# Patient Record
Sex: Male | Born: 1965 | Race: White | Hispanic: No | Smoking: Never smoker
Health system: Southern US, Community
[De-identification: ages and names within clinical notes are randomized; demographics above are authoritative.]

## PROBLEM LIST (undated history)

## (undated) DIAGNOSIS — R7303 Prediabetes: Secondary | ICD-10-CM

## (undated) DIAGNOSIS — Z87442 Personal history of urinary calculi: Secondary | ICD-10-CM

## (undated) DIAGNOSIS — F419 Anxiety disorder, unspecified: Secondary | ICD-10-CM

## (undated) DIAGNOSIS — I10 Essential (primary) hypertension: Secondary | ICD-10-CM

## (undated) DIAGNOSIS — F32A Depression, unspecified: Secondary | ICD-10-CM

## (undated) DIAGNOSIS — R0600 Dyspnea, unspecified: Secondary | ICD-10-CM

## (undated) DIAGNOSIS — D649 Anemia, unspecified: Secondary | ICD-10-CM

## (undated) DIAGNOSIS — M1990 Unspecified osteoarthritis, unspecified site: Secondary | ICD-10-CM

## (undated) DIAGNOSIS — G709 Myoneural disorder, unspecified: Secondary | ICD-10-CM

## (undated) DIAGNOSIS — K219 Gastro-esophageal reflux disease without esophagitis: Secondary | ICD-10-CM

## (undated) DIAGNOSIS — C801 Malignant (primary) neoplasm, unspecified: Secondary | ICD-10-CM

## (undated) DIAGNOSIS — E119 Type 2 diabetes mellitus without complications: Secondary | ICD-10-CM

## (undated) DIAGNOSIS — F329 Major depressive disorder, single episode, unspecified: Secondary | ICD-10-CM

## (undated) HISTORY — PX: APPENDECTOMY: SHX54

## (undated) HISTORY — PX: COLONOSCOPY: SHX174

## (undated) HISTORY — PX: COLON SURGERY: SHX602

---
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
## 2013-05-30 ENCOUNTER — Telehealth: Payer: Self-pay | Admitting: Pulmonary Disease

## 2013-05-30 NOTE — Telephone Encounter (Signed)
Spoke to pt. Has an OV with KC on 07/09/13 for a sleep consult. Pt can not afford a sleep study at this time. He was wondering if there was anything else [facility] can do for him without having a sleep study. I advised him that I could not answer that question for him but more than likely KC would want to have a sleep study done. States that he is going to talk to Dr.Shoemaker and figure out if he really needs to see [facility]. He will call us back and let us know if he will need his appointment.  OV will not be canceled until pt calls and lets [place] know what his decision is.

## 2013-06-25 ENCOUNTER — Ambulatory Visit (HOSPITAL_BASED_OUTPATIENT_CLINIC_OR_DEPARTMENT_OTHER): Payer: Self-pay

## 2013-07-09 ENCOUNTER — Institutional Professional Consult (permissible substitution): Payer: BC Managed Care – PPO | Admitting: Pulmonary Disease

## 2014-11-17 ENCOUNTER — Other Ambulatory Visit: Payer: Self-pay | Admitting: Family Medicine

## 2014-11-17 ENCOUNTER — Ambulatory Visit
Admission: RE | Admit: 2014-11-17 | Discharge: 2014-11-17 | Disposition: A | Discharge status: Home / Self Care | Payer: BLUE CROSS/BLUE SHIELD | Source: Ambulatory Visit | Attending: Family Medicine | Admitting: Family Medicine

## 2014-11-17 DIAGNOSIS — M5489 Other dorsalgia: Secondary | ICD-10-CM

## 2016-05-25 ENCOUNTER — Ambulatory Visit (INDEPENDENT_AMBULATORY_CARE_PROVIDER_SITE_OTHER): Payer: BLUE CROSS/BLUE SHIELD | Admitting: Neurology

## 2016-05-25 ENCOUNTER — Other Ambulatory Visit: Payer: Self-pay | Admitting: *Deleted

## 2016-05-25 DIAGNOSIS — R202 Paresthesia of skin: Secondary | ICD-10-CM

## 2016-05-25 DIAGNOSIS — G5603 Carpal tunnel syndrome, bilateral upper limbs: Secondary | ICD-10-CM

## 2016-05-25 NOTE — Procedures (Signed)
[facility] Neurology  [address], [address]  [place], [place] [address] Tel: [phone] Fax:  [phone] Test Date:  05/25/2016  Patient: Anthony Calhoun DOB: June 18, 1966 Physician: Narda Amber, DO  Sex: Male Height: 5\' 6"  Ref Phys: Antony Contras, M.D.  ID#: [id number] Temp: 35.6C Technician:    Patient Complaints: This is a 50 year-old gentleman referred for evaluation of hand pain, weakness, and paresthesias, worse on the left.  NCV & EMG Findings: Extensive electrodiagnostic testing of the right upper extremity and additional studies of the left shows: 1. Right median sensory response shows prolonged distal peak latency (7.1 ms) and reduced amplitude (5.8 V).  Left median sensory response is absent. Bilateral ulnar sensory responses are within normal limits. 2. Bilateral median motor responses show prolonged distal onset latency (R7.4, L7.9 ms).  Bilateral ulnar motor responses are within normal limits. 3. Mild chronic motor axon loss changes are seen affecting bilateral abductor pollicis brevis muscles, without accompanied active denervation.  Impression: 1. Bilateral median neuropathy, at or distal to the risk, consistent with the clinical diagnosis of carpal tunnel syndrome. Overall, these findings are severe in degree electrically, and worse on the left. 2. There is no evidence of a cervical radiculopathy affecting the upper extremities.   ___________________________ Narda Amber, DO    Nerve Conduction Studies Anti Sensory Summary Table   Site NR Peak (ms) Norm Peak (ms) P-T Amp (V) Norm P-T Amp  Left Median Anti Sensory (2nd Digit)  Wrist NR  <3.6  >15  Right Median Anti Sensory (2nd Digit)  Wrist    7.1 <3.6 5.8 >15  Left Ulnar Anti Sensory (5th Digit)  Wrist    2.8 <3.1 16.1 >10  Right Ulnar Anti Sensory (5th Digit)  Wrist    2.6 <3.1 16.9 >10   Motor Summary Table   Site NR Onset (ms) Norm Onset (ms) O-P Amp (mV) Norm O-P Amp Site1 Site2 Delta-0  (ms) Dist (cm) Vel (m/s) Norm Vel (m/s)  Left Median Motor (Abd Poll Brev)  Wrist    7.9 <4.0 6.1 >6 Elbow Wrist 4.2 28.0 67 >50  Elbow    12.1  6.0         Right Median Motor (Abd Poll Brev)  Wrist    7.4 <4.0 6.2 >6 Elbow Wrist 5.3 27.0 51 >50  Elbow    12.7  5.9         Left Ulnar Motor (Abd Dig Minimi)  Wrist    2.9 <3.1 8.3 >7 B Elbow Wrist 3.5 23.0 66 >50  B Elbow    6.4  7.5  A Elbow B Elbow 2.0 10.0 50 >50  A Elbow    8.4  7.4         Right Ulnar Motor (Abd Dig Minimi)  Wrist    2.5 <3.1 9.8 >7 B Elbow Wrist 4.1 25.0 61 >50  B Elbow    6.6  9.6  A Elbow B Elbow 1.8 10.0 56 >50  A Elbow    8.4  9.5          EMG   Side Muscle Ins Act Fibs Psw Fasc Number Recrt Dur Dur. Amp Amp. Poly Poly. Comment  Left 1stDorInt Nml Nml Nml Nml Nml Nml Nml Nml Nml Nml Nml Nml N/A  Left Abd Poll Brev Nml Nml Nml Nml 1- Rapid Some 1+ Some 1+ Nml Nml N/A  Left Ext Indicis Nml Nml Nml Nml Nml Nml Nml Nml Nml Nml Nml Nml N/A  Left PronatorTeres Nml Nml Nml Nml Nml Nml Nml Nml Nml Nml Nml Nml N/A  Left Biceps Nml Nml Nml Nml Nml Nml Nml Nml Nml Nml Nml Nml N/A  Left Triceps Nml Nml Nml Nml Nml Nml Nml Nml Nml Nml Nml Nml N/A  Left Deltoid Nml Nml Nml Nml Nml Nml Nml Nml Nml Nml Nml Nml N/A  Right 1stDorInt Nml Nml Nml Nml Nml Nml Nml Nml Nml Nml Nml Nml N/A  Right Abd Poll Brev Nml Nml Nml Nml 1- Rapid Few 1+ Few 1+ Nml Nml N/A  Right Ext Indicis Nml Nml Nml Nml Nml Nml Nml Nml Nml Nml Nml Nml N/A  Right PronatorTeres Nml Nml Nml Nml Nml Nml Nml Nml Nml Nml Nml Nml N/A  Right Biceps Nml Nml Nml Nml Nml Nml Nml Nml Nml Nml Nml Nml N/A  Right Triceps Nml Nml Nml Nml Nml Nml Nml Nml Nml Nml Nml Nml N/A  Right Deltoid Nml Nml Nml Nml Nml Nml Nml Nml Nml Nml Nml Nml N/A      Waveforms:

## 2016-07-27 ENCOUNTER — Other Ambulatory Visit: Payer: Self-pay | Admitting: Family Medicine

## 2016-07-27 ENCOUNTER — Ambulatory Visit
Admission: RE | Admit: 2016-07-27 | Discharge: 2016-07-27 | Disposition: A | Discharge status: Home / Self Care | Payer: BLUE CROSS/BLUE SHIELD | Source: Ambulatory Visit | Attending: Family Medicine | Admitting: Family Medicine

## 2016-07-27 DIAGNOSIS — R9389 Abnormal findings on diagnostic imaging of other specified body structures: Secondary | ICD-10-CM

## 2017-08-30 ENCOUNTER — Other Ambulatory Visit: Payer: Self-pay

## 2017-08-30 ENCOUNTER — Encounter (HOSPITAL_COMMUNITY): Payer: Self-pay | Admitting: Emergency Medicine

## 2017-08-30 DIAGNOSIS — N201 Calculus of ureter: Secondary | ICD-10-CM | POA: Insufficient documentation

## 2017-08-30 DIAGNOSIS — R300 Dysuria: Secondary | ICD-10-CM | POA: Diagnosis not present

## 2017-08-30 DIAGNOSIS — R109 Unspecified abdominal pain: Secondary | ICD-10-CM | POA: Diagnosis present

## 2017-08-30 DIAGNOSIS — I10 Essential (primary) hypertension: Secondary | ICD-10-CM | POA: Diagnosis not present

## 2017-08-30 LAB — URINALYSIS, ROUTINE W REFLEX MICROSCOPIC
Bacteria, UA: NONE SEEN
Bilirubin Urine: NEGATIVE
Glucose, UA: NEGATIVE mg/dL
Ketones, ur: NEGATIVE mg/dL
Leukocytes, UA: NEGATIVE
Nitrite: NEGATIVE
Protein, ur: NEGATIVE mg/dL
Specific Gravity, Urine: 1.011 (ref 1.005–1.030)
Squamous Epithelial / HPF: NONE SEEN
pH: 6 (ref 5.0–8.0)

## 2017-08-30 LAB — COMPREHENSIVE METABOLIC PANEL
ALBUMIN: 4.1 g/dL (ref 3.5–5.0)
ALT: 30 U/L (ref 17–63)
ANION GAP: 10 (ref 5–15)
AST: 35 U/L (ref 15–41)
Alkaline Phosphatase: 119 U/L (ref 38–126)
BUN: 9 mg/dL (ref 6–20)
CO2: 26 mmol/L (ref 22–32)
Calcium: 9.3 mg/dL (ref 8.9–10.3)
Chloride: 100 mmol/L — ABNORMAL LOW (ref 101–111)
Creatinine, Ser: 0.92 mg/dL (ref 0.61–1.24)
GFR calc non Af Amer: >60 mL/min (ref >60)
Glucose, Bld: 134 mg/dL — ABNORMAL HIGH (ref 65–99)
POTASSIUM: 4.4 mmol/L (ref 3.5–5.1)
Sodium: 136 mmol/L (ref 135–145)
Total Bilirubin: 0.8 mg/dL (ref 0.3–1.2)
Total Protein: 7.7 g/dL (ref 6.5–8.1)

## 2017-08-30 LAB — CBC
HCT: 45.7 % (ref 39.0–52.0)
Hemoglobin: 15.6 g/dL (ref 13.0–17.0)
MCH: 29.3 pg (ref 26.0–34.0)
MCHC: 34.1 g/dL (ref 30.0–36.0)
MCV: 85.7 fL (ref 78.0–100.0)
Platelets: 309 K/uL (ref 150–400)
RBC: 5.33 MIL/uL (ref 4.22–5.81)
RDW: 13.6 % (ref 11.5–15.5)
WBC: 11.4 K/uL — ABNORMAL HIGH (ref 4.0–10.5)

## 2017-08-30 LAB — LIPASE, BLOOD: Lipase: 22 U/L (ref 11–51)

## 2017-08-30 NOTE — ED Triage Notes (Signed)
Pt c/o lower abdominal pain that radiates to his back. C/o nausea and dry heaving and multiple soft stools. Also c/o a burning sensation and frequency with urination. Hx kidney stones.

## 2017-08-30 NOTE — ED Notes (Signed)
Pt and pt family member does not want to go back out to waiting room pt family member stated that "if he passes out will he go straight back to a room. " I advise pt and pt family that he will come back and reassess. Pt and pt family are becoming really addiment about coming back to a room and that he should not have to wait in waiting room to see a provider. I told pt and pt family that I understand the pain he in is however we have a lot of other pt that are coming in that are really sick as well and we will get him a room as quickly as we can. I told pt and pt family that he have his blood working and UA and if anything comes up really abnormal we will reassess from there. Pt family continue to say" if and when he passes out will he come back to see a doctor and if not the doctor will come see him."

## 2017-08-31 ENCOUNTER — Emergency Department (HOSPITAL_COMMUNITY)
Admission: EM | Admit: 2017-08-31 | Discharge: 2017-08-31 | Disposition: A | Discharge status: Home / Self Care | Payer: BLUE CROSS/BLUE SHIELD | Attending: Emergency Medicine | Admitting: Emergency Medicine

## 2017-08-31 DIAGNOSIS — N201 Calculus of ureter: Secondary | ICD-10-CM

## 2017-08-31 DIAGNOSIS — R300 Dysuria: Secondary | ICD-10-CM

## 2017-08-31 HISTORY — DX: Essential (primary) hypertension: I10

## 2017-08-31 MED ORDER — ONDANSETRON HCL 4 MG PO TABS: ORAL_TABLET | ORAL | 0 refills | Status: DC

## 2017-08-31 MED ORDER — ONDANSETRON 4 MG PO TBDP
8.0000 mg | ORAL_TABLET | Freq: Once | ORAL | Status: AC
  Administered 2017-08-31: 8 mg via ORAL
  Filled 2017-08-31: qty 2

## 2017-08-31 MED ORDER — PHENAZOPYRIDINE HCL 100 MG PO TABS
200.0000 mg | ORAL_TABLET | Freq: Once | ORAL | Status: AC
  Administered 2017-08-31: 200 mg via ORAL
  Filled 2017-08-31: qty 2

## 2017-08-31 MED ORDER — IBUPROFEN 400 MG PO TABS
600.0000 mg | ORAL_TABLET | Freq: Once | ORAL | Status: AC
  Administered 2017-08-31: 600 mg via ORAL
  Filled 2017-08-31: qty 1

## 2017-08-31 MED ORDER — PHENAZOPYRIDINE HCL 200 MG PO TABS: 200.0000 mg | ORAL_TABLET | Freq: Three times a day (TID) | ORAL | 0 refills | Status: DC | PRN

## 2017-08-31 NOTE — ED Provider Notes (Signed)
[place] EMERGENCY DEPARTMENT Provider Note   CSN: [id number] Arrival date & time: 08/30/17  1717  Time seen 01:20 AM   History   Chief Complaint Chief Complaint  Patient presents with  . Abdominal Pain    HPI Anthony Calhoun is a 52 y.o. male.  HPI patient reports about 10 AM on January 3 he started having diffuse abdominal pain and bilateral flank pain.  He describes the pain as knifelike and constant but states the pain waxed and waned.  He states nothing he did made it hurt more, however having a bowel movement would make it is up a little.  He has had nausea and dry heaves without vomiting.  He states he has had 4-6 mushy bowel movements today.  He states while in the emergency department waiting room about 730 or 8 PM he passed a kidney stone.  Since then his pain is much improved and he just has some mild nausea now.  He also complains of throbbing in his penis.  He states before when he had that the urologist treated him with Pyridium.  He also describes being unable to urinate prior to passing the stone and now he is having polyuria.  He also states when he passed a stone he saw some blood in his urine.  He states he has had multiple kidney stones in the past, too numerous to count.  He was seen by [facility] about a year ago.  He states he is never had to have lithotripsy or stent however he did have cystoscopy for stone removal once when he was 60.  He states he usually gets them in the summer.  He usually gets them about once a year.  PCP Antony Contras, MD   Past Medical History:  Diagnosis Date  . Hypertension     There are no active problems to display for this patient.   History reviewed. No pertinent surgical history.     Home Medications    Prior to Admission medications   Medication Sig Start Date End Date Taking? Authorizing Provider  ondansetron (ZOFRAN) 4 MG tablet Take 1 or 2 under the tongue Q 8hrs for nausea 08/31/17    Rolland Porter, MD  phenazopyridine (PYRIDIUM) 200 MG tablet Take 1 tablet (200 mg total) by mouth 3 (three) times daily as needed (pain on urination). 08/31/17   Rolland Porter, MD    Family History No family history on file.  Social History Social History   Tobacco Use  . Smoking status: Never Smoker  . Smokeless tobacco: Never Used  Substance Use Topics  . Alcohol use: No    Frequency: Never  . Drug use: No  employed   Allergies   Prednisone   Review of Systems Review of Systems  All other systems reviewed and are negative.    Physical Exam Updated Vital Signs BP 121/71   Pulse 86   Temp 98.4 F (36.9 C) (Oral)   Resp 16   Ht 5\' 6"  (1.676 m)   Wt 117.9 kg (260 lb)   SpO2 97%   BMI 41.97 kg/m   Physical Exam  Constitutional: He is oriented to person, place, and time. He appears well-developed and well-nourished.  Non-toxic appearance. He does not appear ill. No distress.  HENT:  Head: Normocephalic and atraumatic.  Right Ear: External ear normal.  Left Ear: External ear normal.  Nose: Nose normal. No mucosal edema or rhinorrhea.  Mouth/Throat: Oropharynx is clear and moist and  mucous membranes are normal. No dental abscesses or uvula swelling.  Eyes: Conjunctivae and EOM are normal. Pupils are equal, round, and reactive to light.  Neck: Normal range of motion and full passive range of motion without pain. Neck supple.  Cardiovascular: Normal rate, regular rhythm and normal heart sounds. Exam reveals no gallop and no friction rub.  No murmur heard. Pulmonary/Chest: Effort normal and breath sounds normal. No respiratory distress. He has no wheezes. He has no rhonchi. He has no rales. He exhibits no tenderness and no crepitus.  Abdominal: Soft. Normal appearance and bowel sounds are normal. He exhibits no distension. There is no tenderness. There is no rebound and no guarding.  No CVA tenderness at this time.  His abdomen is nontender at this time.  Musculoskeletal:  Normal range of motion. He exhibits no edema or tenderness.  Moves all extremities well.   Neurological: He is alert and oriented to person, place, and time. He has normal strength. No cranial nerve deficit.  Skin: Skin is warm, dry and intact. No rash noted. No erythema. No pallor.  Psychiatric: He has a normal mood and affect. His speech is normal and behavior is normal. His mood appears not anxious.  Nursing note and vitals reviewed.    ED Treatments / Results  Labs (all labs ordered are listed, but only abnormal results are displayed) Results for orders placed or performed during the hospital encounter of 08/31/17  Lipase, blood  Result Value Ref Range   Lipase 22 11 - 51 U/L  Comprehensive metabolic panel  Result Value Ref Range   Sodium 136 135 - 145 mmol/L   Potassium 4.4 3.5 - 5.1 mmol/L   Chloride 100 (L) 101 - 111 mmol/L   CO2 26 22 - 32 mmol/L   Glucose, Bld 134 (H) 65 - 99 mg/dL   BUN 9 6 - 20 mg/dL   Creatinine, Ser 0.92 0.61 - 1.24 mg/dL   Calcium 9.3 8.9 - 10.3 mg/dL   Total Protein 7.7 6.5 - 8.1 g/dL   Albumin 4.1 3.5 - 5.0 g/dL   AST 35 15 - 41 U/L   ALT 30 17 - 63 U/L   Alkaline Phosphatase 119 38 - 126 U/L   Total Bilirubin 0.8 0.3 - 1.2 mg/dL   GFR calc non Af Amer >60 >60 mL/min   GFR calc Af Amer >60 >60 mL/min   Anion gap 10 5 - 15  CBC  Result Value Ref Range   WBC 11.4 (H) 4.0 - 10.5 K/uL   RBC 5.33 4.22 - 5.81 MIL/uL   Hemoglobin 15.6 13.0 - 17.0 g/dL   HCT 45.7 39.0 - 52.0 %   MCV 85.7 78.0 - 100.0 fL   MCH 29.3 26.0 - 34.0 pg   MCHC 34.1 30.0 - 36.0 g/dL   RDW 13.6 11.5 - 15.5 %   Platelets 309 150 - 400 K/uL  Urinalysis, Routine w reflex microscopic  Result Value Ref Range   Color, Urine YELLOW YELLOW   APPearance CLEAR CLEAR   Specific Gravity, Urine 1.011 1.005 - 1.030   pH 6.0 5.0 - 8.0   Glucose, UA NEGATIVE NEGATIVE mg/dL   Hgb urine dipstick LARGE (A) NEGATIVE   Bilirubin Urine NEGATIVE NEGATIVE   Ketones, ur NEGATIVE NEGATIVE  mg/dL   Protein, ur NEGATIVE NEGATIVE mg/dL   Nitrite NEGATIVE NEGATIVE   Leukocytes, UA NEGATIVE NEGATIVE   RBC / HPF TOO NUMEROUS TO COUNT 0 - 5 RBC/hpf   WBC, UA 0-5  0 - 5 WBC/hpf   Bacteria, UA NONE SEEN NONE SEEN   Squamous Epithelial / LPF NONE SEEN NONE SEEN   Mucus PRESENT    Laboratory interpretation all normal except hematuria, leukocytosis  EKG  EKG Interpretation None       Radiology No results found.  Procedures Procedures (including critical care time)  Medications Ordered in ED Medications  ibuprofen (ADVIL,MOTRIN) tablet 600 mg (not administered)  ondansetron (ZOFRAN-ODT) disintegrating tablet 8 mg (8 mg Oral Given 08/31/17 0142)  phenazopyridine (PYRIDIUM) tablet 200 mg (200 mg Oral Given 08/31/17 0142)     Initial Impression / Assessment and Plan / ED Course  I have reviewed the triage vital signs and the nursing notes.  Pertinent labs & imaging results that were available during my care of the patient were reviewed by me and considered in my medical decision making (see chart for details).    At the time of my exam patient's main complaint was nausea and the dysuria.  He was given the option of getting IV fluids and IV nausea medication or doing the ODT nausea medication and drinking fluids on his own.  He has been in the emergency department since 6 PM today.  He has elected to do the Zofran ODT and drink fluids.  I also offered to do a CT scan today just for verification if he has any more stones however patient understood it would probably be another couple hours to get it done and get it resulted and he has elected to wait.  He states his pain is much improved.  He has no pain when I palpate his abdomen or percuss over his kidneys.  Patient has a stone in a paper towel in the urine cup that he states he passed in the waiting room.  He was advised he may have some mild achiness now that could be treated with Motrin or Tylenol.  Patient states he has a headache  now because of his frustration of waiting to be seen in the waiting room.  He was given ibuprofen.   Final Clinical Impressions(s) / ED Diagnoses   Final diagnoses:  Dysuria  Ureteral stone    ED Discharge Orders        Ordered    ondansetron (ZOFRAN) 4 MG tablet     08/31/17 0141    phenazopyridine (PYRIDIUM) 200 MG tablet  3 times daily PRN     08/31/17 0141    OTC ibuprofen and acetaminophen  Plan discharge  Rolland Porter, MD, Barbette Or, MD 08/31/17 [phone]

## 2017-08-31 NOTE — ED Notes (Signed)
ED Provider at bedside. 

## 2017-08-31 NOTE — Discharge Instructions (Signed)
Drink plenty of fluids. You can take ibuprofen 600 mg and/or acetaminophen 1000 mg every 6 hrs as needed for aching from passing the stone tonight. Take the pyridium for the pain in your penis and use the zofran for nausea or vomiting.  Recheck if you get a fever, get severe pain again, have uncontrolled vomiting or seem worse. Look at the dietary information to see if there are foods in your diet you should avoid.

## 2018-06-11 ENCOUNTER — Other Ambulatory Visit: Payer: Self-pay | Admitting: Family Medicine

## 2018-06-11 DIAGNOSIS — R0781 Pleurodynia: Secondary | ICD-10-CM

## 2018-06-14 ENCOUNTER — Ambulatory Visit
Admission: RE | Admit: 2018-06-14 | Discharge: 2018-06-14 | Disposition: A | Discharge status: Home / Self Care | Payer: BLUE CROSS/BLUE SHIELD | Source: Ambulatory Visit | Attending: Family Medicine | Admitting: Family Medicine

## 2018-06-14 DIAGNOSIS — R0781 Pleurodynia: Secondary | ICD-10-CM

## 2020-02-13 ENCOUNTER — Other Ambulatory Visit: Payer: Self-pay | Admitting: Gastroenterology

## 2020-02-13 DIAGNOSIS — K6389 Other specified diseases of intestine: Secondary | ICD-10-CM

## 2020-02-13 NOTE — Progress Notes (Signed)
Our office had received a phone call from the patient stating he had a colonoscopy by [facility] yesterday and was told he has a malignant tumor.  I have sent their referral coordinator Bennett Scrape a fax requesting the patient colonoscopy report and pathology report be faxed as soon as available as he is requesting to see Dr. Benay Spice to [facility] at fax [phone].

## 2020-02-16 ENCOUNTER — Telehealth: Payer: Self-pay | Admitting: *Deleted

## 2020-02-16 NOTE — Telephone Encounter (Addendum)
Patient left VM expressing severe anxiety regarding his new diagnosis of colon cancer from recent colonoscopy. Asking to speak to someone for some comfort and hope for his condition. State "I'm a nervous wreck and can't sleep and can't eat".  Wants to see Anthony Calhoun as soon as he can (CT scans on 03/02/20).  Called patient and encouraged him that colon cancer is very curable as long as in early stage. States this was his 1st screening colonoscopy. Will not be able to determine stage until scan and surgery is done. Instructed him to call office when his surgery is scheduled and we'll set up his appointment then. Attempted to reassure him that his journey is not guaranteed to be the same his uncle Anthony Calhoun took--he had many other co-morbidities that impacted his outcome. He expressed appreciation for the call.

## 2020-03-02 ENCOUNTER — Ambulatory Visit
Admission: RE | Admit: 2020-03-02 | Discharge: 2020-03-02 | Disposition: A | Discharge status: Home / Self Care | Payer: BLUE CROSS/BLUE SHIELD | Source: Ambulatory Visit | Attending: Gastroenterology | Admitting: Gastroenterology

## 2020-03-02 DIAGNOSIS — K6389 Other specified diseases of intestine: Secondary | ICD-10-CM

## 2020-03-02 MED ORDER — IOPAMIDOL (ISOVUE-300) INJECTION 61%
125.0000 mL | Freq: Once | INTRAVENOUS | Status: AC | PRN
  Administered 2020-03-02: 125 mL via INTRAVENOUS

## 2020-03-03 ENCOUNTER — Ambulatory Visit: Payer: Self-pay | Admitting: General Surgery

## 2020-03-03 NOTE — H&P (Signed)
The patient is a 54 year old male who presents with colorectal cancer. 54 year old male who presents to the office for evaluation of a newly diagnosed colon adenocarcinoma. Patient was noted to be anemic on his physical exam and fit test was positive. A colonoscopy was performed. This showed a couple transverse colon polyps which were resected and a mass in the cecum which was biopsied. Biopsies show adenocarcinoma. CT scans of the chest, abdomen and pelvis show no signs of metastatic disease.   Past Surgical History Antonietta Jewel, [place]; 03/03/2020 2:02 PM) Colon Polyp Removal - Colonoscopy  Diagnostic Studies History (Chanel Teressa Senter, CMA; 03/03/2020 2:02 PM) Colonoscopy within last year  Allergies [facility] [employer] Teressa Senter, [place]; 03/03/2020 2:03 PM) predniSONE *CORTICOSTEROIDS* Allergies Reconciled  Medication History (Chanel Teressa Senter, CMA; 03/03/2020 2:04 PM) amLODIPine Besylate (5MG  Tablet, Oral) Active. ALPRAZolam (1MG  Tablet, Oral) Active. Carvedilol (6.25MG  Tablet, Oral) Active. Citalopram Hydrobromide (40MG  Tablet, Oral) Active. Omeprazole (20MG  Capsule DR, Oral) Active. Atorvastatin Calcium (40MG  Tablet, Oral) Active. Medications Reconciled  Social History Antonietta Jewel, CMA; 03/03/2020 2:02 PM) Caffeine use Carbonated beverages. No alcohol use No drug use Tobacco use Never smoker.  Family History (Hamilton, Donora; 03/03/2020 2:02 PM) Arthritis Family Members In General, Mother. Bleeding disorder Mother. Cancer Brother, Mother. Cerebrovascular Accident Mother. Colon Polyps Mother. Depression Daughter, Family Members In [place], Mother, Son. Diabetes Mellitus Mother. Hypertension Mother. Thyroid problems Mother.  Other Problems Antonietta Jewel, CMA; 03/03/2020 2:02 PM) Anxiety Disorder Arthritis Colon Cancer Depression Diabetes Mellitus Gastroesophageal Reflux Disease High blood pressure Hypercholesterolemia Kidney Stone Sleep Apnea     Review of  Systems (Chanel Nolan CMA; 03/03/2020 2:02 PM) General Present- Fatigue. Not Present- Appetite Loss, Chills, Fever, Night Sweats, Weight Gain and Weight Loss. HEENT Present- Hearing Loss, Ringing in the Ears and Wears glasses/contact lenses. Not Present- Earache, Hoarseness, Nose Bleed, Oral Ulcers, Seasonal Allergies, Sinus Pain, Sore Throat, Visual Disturbances and Yellow Eyes. Respiratory Present- Snoring. Not Present- Bloody sputum, Chronic Cough, Difficulty Breathing and Wheezing. Breast Not Present- Breast Mass, Breast Pain, Nipple Discharge and Skin Changes. Cardiovascular Present- Difficulty Breathing Lying Down and Shortness of Breath. Not Present- Chest Pain, Leg Cramps, Palpitations, Rapid Heart Rate and Swelling of Extremities. Gastrointestinal Present- Bloating, Excessive gas and Indigestion. Not Present- Abdominal Pain, Bloody Stool, Change in Bowel Habits, Chronic diarrhea, Constipation, Difficulty Swallowing, Gets full quickly at meals, Hemorrhoids, Nausea, Rectal Pain and Vomiting. Male Genitourinary Not Present- Blood in Urine, Change in Urinary Stream, Frequency, Impotence, Nocturia, Painful Urination, Urgency and Urine Leakage. Musculoskeletal Present- Back Pain and Muscle Weakness. Not Present- Joint Pain, Joint Stiffness, Muscle Pain and Swelling of Extremities. Neurological Present- Weakness. Not Present- Decreased Memory, Fainting, Headaches, Numbness, Seizures, Tingling, Tremor and Trouble walking. Psychiatric Present- Anxiety, Depression and Fearful. Not Present- Bipolar, Change in Sleep Pattern and Frequent crying. Endocrine Present- New Diabetes. Not Present- Cold Intolerance, Excessive Hunger, Hair Changes, Heat Intolerance and Hot flashes. Hematology Not Present- Blood Thinners, Easy Bruising, Excessive bleeding, Gland problems, HIV and Persistent Infections.  Vitals (Chanel Nolan CMA; 03/03/2020 2:05 PM) 03/03/2020 2:04 PM Weight: 278.38 lb Height: 66in Body Surface  Area: 2.3 m Body Mass Index: 44.93 kg/m  Temp.: 97.20F  Pulse: 100 (Regular)  BP: 134/82(Sitting, Left Arm, Standard)        Physical Exam Leighton Ruff MD; 03/29/9936 2:34 PM)  General Mental Status-Alert. General Appearance-Cooperative.  Abdomen Palpation/Percussion Palpation and Percussion of the abdomen reveal - Soft and Non Tender.    Assessment & Plan Leighton Ruff MD; 09/02/9676 2:33 PM)  COLON CANCER, ASCENDING (C18.2) Impression: 54 year old male who presents to the office for evaluation of a newly diagnosed ascending colon cancer. This was found on colonoscopy which was performed due to anemia and a positive fit test. A cecal mass was noted. Biopsy showed adenocarcinoma. Distal area was tattooed. CT scans of the chest, abdomen and pelvis showed no signs of metastatic disease. The surgery and anatomy were described to the patient as well as the risks of surgery and the possible complications. These include: Bleeding, deep abdominal infections and possible wound complications such as hernia and infection, damage to adjacent structures, leak of surgical connections, which can lead to other surgeries and possibly an ostomy, possible need for other procedures, such as abscess drains in radiology, possible prolonged hospital stay, possible diarrhea from removal of part of the colon, possible constipation from narcotics, possible bowel, bladder or sexual dysfunction if having rectal surgery, prolonged fatigue/weakness or appetite loss, possible early recurrence of of disease, possible complications of their medical problems such as heart disease or arrhythmias or lung problems, death (less than 1%). I believe the patient understands and wishes to proceed with the surgery.

## 2020-04-06 NOTE — Patient Instructions (Addendum)
DUE TO COVID-19 ONLY ONE VISITOR IS ALLOWED TO COME WITH YOU AND STAY IN THE WAITING ROOM ONLY DURING PRE OP AND PROCEDURE DAY OF SURGERY. THE 1 VISITOR  MAY VISIT WITH YOU AFTER SURGERY IN YOUR PRIVATE ROOM DURING VISITING HOURS ONLY!  YOU NEED TO HAVE A COVID 19 TEST ON_8/12______ @_12 :00______, THIS TEST MUST BE DONE BEFORE SURGERY,  COVID TESTING SITE [place] [id number], IT IS ON THE RIGHT GOING OUT [address] APPROXIMATELY  2 MINUTES PAST [employer] ON THE RIGHT. ONCE YOUR COVID TEST IS COMPLETED,  PLEASE BEGIN THE QUARANTINE INSTRUCTIONS AS OUTLINED IN YOUR HANDOUT.                Kellon Chalk [place]   Your procedure is scheduled on: 04/12/20   Report to [facility] Main  Entrance   Report to admitting at  8:00 AM     Call this number if you have problems the morning of surgery [place], NO CHEWING GUM [place].  Follow bowel prep instructions from Dr. Manon Hilding office  Drink plenty of fluids the day of the prep to prevent dehydration.   DRINK 2 PRESURGERY ENSURE DRINKS THE NIGHT BEFORE SURGERY AT 10:00 PM.    NO SOLIDS AFTER MIDNIGHT THE DAY PRIOR TO THE SURGERY.   NOTHING BY MOUTH EXCEPT CLEAR LIQUIDS UNTIL   7:00 AM    CLEAR LIQUID DIET   Foods Allowed                                                                     Foods Excluded  Coffee and tea, regular and decaf                             liquids that you cannot  Plain Jell-O any favor except red or purple                                           see through such as: Fruit ices (not with fruit pulp)                                     milk, soups, orange juice  Iced Popsicles                                    All solid food Carbonated beverages, regular and diet                                    Cranberry, grape and apple juices Sports drinks like Gatorade Lightly seasoned clear broth or  consume(fat free) Sugar, honey syrup   PLEASE FINISH PRESURGERY ENSURE DRINK PER SURGEON BY:  7:00 AM   Take these medicines the morning of surgery with A SIP  OF WATER: Citalopram, Xanax if needed, Amlodipine, Omeprazole,Carvedilol  How to Manage Your Diabetes Before and After Surgery  Why is it important to control my blood sugar before and after surgery? . Improving blood sugar levels before and after surgery helps healing and can limit problems. . A way of improving blood sugar control is eating a healthy diet by: o  Eating less sugar and carbohydrates o  Increasing activity/exercise o  Talking with your doctor about reaching your blood sugar goals . High blood sugars (greater than 180 mg/dL) can raise your risk of infections and slow your recovery, so you will need to focus on controlling your diabetes during the weeks before surgery. . Make sure that the doctor who takes care of your diabetes knows about your planned surgery including the date and location.  How do I manage my blood sugar before surgery? . Check your blood sugar at least 4 times a day, starting 2 days before surgery, to make sure that the level is not too high or low. o Check your blood sugar the morning of your surgery when you wake up and every 2 hours until you get to the Short Stay unit. . If your blood sugar is less than 70 mg/dL, you will need to treat for low blood sugar: o Do not take insulin. o Treat a low blood sugar (less than 70 mg/dL) with  cup of clear juice (cranberry or apple), 4 glucose tablets, OR glucose gel. o Recheck blood sugar in 15 minutes after treatment (to make sure it is greater than 70 mg/dL). If your blood sugar is not greater than 70 mg/dL on recheck, call [phone] for further instructions. . Report your blood sugar to the short stay nurse when you get to Short Stay.  . If you are admitted to the hospital after surgery: o Your blood sugar will be checked by the staff and you will  probably be given insulin after surgery (instead of oral diabetes medicines) to make sure you have good blood sugar levels. o The goal for blood sugar control after surgery is 80-180 mg/dL                                      You may not have any metal on your body including              piercings  Do not wear jewelry, , lotions, powders or deodorant                         Men may shave face and neck.   Do not bring valuables to the hospital. [place].  Contacts, dentures or bridgework may not be worn into surgery.                  Please read over the following fact sheets you were given: _____________________________________________________________________             [facility] - Preparing for Surgery  Before surgery, you can play an important role.   Because skin is not sterile, your skin needs to be as free of germs as possible.   You can reduce the number of germs on your skin by washing with CHG (chlorahexidine gluconate) soap before surgery.   CHG is  an antiseptic cleaner which kills germs and bonds with the skin to continue killing germs even after washing. Please DO NOT use if you have an allergy to CHG or antibacterial soaps .  If your skin becomes reddened/irritated stop using the CHG and inform your nurse when you arrive at Short Stay.   You may shave your face/neck.  Please follow these instructions carefully:  1.  Shower with CHG Soap the night before surgery and the  morning of Surgery.  2.  If you choose to wash your hair, wash your hair first as usual with your  normal  shampoo.  3.  After you shampoo, rinse your hair and body thoroughly to remove the  shampoo.                                        4.  Use CHG as you would any other liquid soap.  You can apply chg directly  to the skin and wash                       Gently with a scrungie or clean washcloth.  5.  Apply the CHG Soap to your body ONLY FROM  THE NECK DOWN.   Do not use on face/ open                           Wound or open sores. Avoid contact with eyes, ears mouth and genitals (private parts).                       Wash face,  Genitals (private parts) with your normal soap.             6.  Wash thoroughly, paying special attention to the area where your surgery  will be performed.  7.  Thoroughly rinse your body with warm water from the neck down.  8.  DO NOT shower/wash with your normal soap after using and rinsing off  the CHG Soap.            9.  Pat yourself dry with a clean towel.            10.  Wear clean pajamas.            11.  Place clean sheets on your bed the night of your first shower and do not  sleep with pets. Day of Surgery : Do not apply any lotions/deodorants the morning of surgery.  Please wear clean clothes to the hospital/surgery center.  FAILURE TO FOLLOW THESE INSTRUCTIONS MAY RESULT IN THE CANCELLATION OF YOUR SURGERY PATIENT SIGNATURE_________________________________  NURSE SIGNATURE__________________________________  ________________________________________________________________________   Adam Phenix  An incentive spirometer is a tool that can help keep your lungs clear and active. This tool measures how well you are filling your lungs with each breath. Taking long deep breaths may help reverse or decrease the chance of developing breathing (pulmonary) problems (especially infection) following:  A long period of time when you are unable to move or be active. BEFORE THE PROCEDURE   If the spirometer includes an indicator to show your best effort, your nurse or respiratory therapist will set it to a desired goal.  If possible, sit up straight or lean slightly forward. Try not to slouch.  Hold the incentive spirometer in an upright position.  INSTRUCTIONS FOR USE  1. Sit on the edge of your bed if possible, or sit up as far as you can in bed or on a chair. 2. Hold the incentive  spirometer in an upright position. 3. Breathe out normally. 4. Place the mouthpiece in your mouth and seal your lips tightly around it. 5. Breathe in slowly and as deeply as possible, raising the piston or the ball toward the top of the column. 6. Hold your breath for 3-5 seconds or for as long as possible. Allow the piston or ball to fall to the bottom of the column. 7. Remove the mouthpiece from your mouth and breathe out normally. 8. Rest for a few seconds and repeat Steps 1 through 7 at least 10 times every 1-2 hours when you are awake. Take your time and take a few normal breaths between deep breaths. 9. The spirometer may include an indicator to show your best effort. Use the indicator as a goal to work toward during each repetition. 10. After each set of 10 deep breaths, practice coughing to be sure your lungs are clear. If you have an incision (the cut made at the time of surgery), support your incision when coughing by placing a pillow or rolled up towels firmly against it. Once you are able to get out of bed, walk around indoors and cough well. You may stop using the incentive spirometer when instructed by your caregiver.  RISKS AND COMPLICATIONS  Take your time so you do not get dizzy or light-headed.  If you are in pain, you may need to take or ask for pain medication before doing incentive spirometry. It is harder to take a deep breath if you are having pain. AFTER USE  Rest and breathe slowly and easily.  It can be helpful to keep track of a log of your progress. Your caregiver can provide you with a simple table to help with this. If you are using the spirometer at home, follow these instructions: [place] IF:   You are having difficultly using the spirometer.  You have trouble using the spirometer as often as instructed.  Your pain medication is not giving enough relief while using the spirometer.  You develop fever of 100.5 F (38.1 C) or higher. SEEK  IMMEDIATE MEDICAL CARE IF:   You cough up bloody sputum that had not been present before.  You develop fever of 102 F (38.9 C) or greater.  You develop worsening pain at or near the incision site. MAKE SURE YOU:   Understand these instructions.  Will watch your condition.  Will get help right away if you are not doing well or get worse. Document Released: 12/25/2006 Document Revised: 11/06/2011 Document Reviewed: 02/25/2007 [facility] Patient Information 2014 [place], [place].   ________________________________________________________________________

## 2020-04-07 ENCOUNTER — Encounter (HOSPITAL_COMMUNITY)
Admission: RE | Admit: 2020-04-07 | Discharge: 2020-04-07 | Disposition: A | Discharge status: Home / Self Care | Payer: 59 | Source: Ambulatory Visit | Attending: General Surgery | Admitting: General Surgery

## 2020-04-07 ENCOUNTER — Other Ambulatory Visit: Payer: Self-pay

## 2020-04-07 ENCOUNTER — Encounter (HOSPITAL_COMMUNITY): Payer: Self-pay

## 2020-04-07 DIAGNOSIS — Z01818 Encounter for other preprocedural examination: Secondary | ICD-10-CM | POA: Insufficient documentation

## 2020-04-07 HISTORY — DX: Depression, unspecified: F32.A

## 2020-04-07 HISTORY — DX: Anemia, unspecified: D64.9

## 2020-04-07 HISTORY — DX: Malignant (primary) neoplasm, unspecified: C80.1

## 2020-04-07 HISTORY — DX: Personal history of urinary calculi: Z87.442

## 2020-04-07 HISTORY — DX: Dyspnea, unspecified: R06.00

## 2020-04-07 HISTORY — DX: Anxiety disorder, unspecified: F41.9

## 2020-04-07 HISTORY — DX: Gastro-esophageal reflux disease without esophagitis: K21.9

## 2020-04-07 HISTORY — DX: Prediabetes: R73.03

## 2020-04-07 HISTORY — DX: Unspecified osteoarthritis, unspecified site: M19.90

## 2020-04-07 HISTORY — DX: Myoneural disorder, unspecified: G70.9

## 2020-04-07 LAB — HEMOGLOBIN A1C
Hgb A1c MFr Bld: 6.6 % — ABNORMAL HIGH (ref 4.8–5.6)
Mean Plasma Glucose: 142.72 mg/dL

## 2020-04-07 LAB — CBC
HCT: 40.8 % (ref 39.0–52.0)
Hemoglobin: 12.5 g/dL — ABNORMAL LOW (ref 13.0–17.0)
MCH: 24.9 pg — ABNORMAL LOW (ref 26.0–34.0)
MCHC: 30.6 g/dL (ref 30.0–36.0)
MCV: 81.3 fL (ref 80.0–100.0)
Platelets: 396 10*3/uL (ref 150–400)
RBC: 5.02 MIL/uL (ref 4.22–5.81)
RDW: 16.3 % — ABNORMAL HIGH (ref 11.5–15.5)
WBC: 8.3 10*3/uL (ref 4.0–10.5)
nRBC: 0 % (ref 0.0–0.2)

## 2020-04-07 LAB — BASIC METABOLIC PANEL
Anion gap: 10 (ref 5–15)
BUN: 10 mg/dL (ref 6–20)
CO2: 24 mmol/L (ref 22–32)
Calcium: 8.4 mg/dL — ABNORMAL LOW (ref 8.9–10.3)
Chloride: 103 mmol/L (ref 98–111)
Creatinine, Ser: 0.76 mg/dL (ref 0.61–1.24)
GFR calc Af Amer: >60 mL/min (ref >60)
GFR calc non Af Amer: >60 mL/min (ref >60)
Glucose, Bld: 130 mg/dL — ABNORMAL HIGH (ref 70–99)
Potassium: 3.6 mmol/L (ref 3.5–5.1)
Sodium: 137 mmol/L (ref 135–145)

## 2020-04-07 NOTE — Progress Notes (Signed)
   04/07/20 1135  OBSTRUCTIVE SLEEP APNEA  Have you ever been diagnosed with sleep apnea through a sleep study? No  Do you snore loudly (loud enough to be heard through closed doors)?  1  Do you often feel tired, fatigued, or sleepy during the daytime (such as falling asleep during driving or talking to someone)? 0  Has anyone observed you stop breathing during your sleep? 1  Do you have, or are you being treated for high blood pressure? 1  BMI more than 35 kg/m2? 1  Age > 50 (1-yes) 1  Neck circumference greater than:Male 16 inches or larger, Male 17inches or larger? 1  Male Gender (Yes=1) 1  Obstructive Sleep Apnea Score 7  Score 5 or greater  Results sent to PCP

## 2020-04-07 NOTE — Progress Notes (Signed)
COVID Vaccine Completed:Yes Date COVID Vaccine completed:12/29/19 COVID vaccine manufacturer:  [employer]   PCP - Dr. Iline Oven Cardiologist - no  Chest x-ray - 03/03/20 EKG - 04/07/20 Stress Test - no ECHO - no Cardiac Cath - no  Sleep Study - no Stop bang score was 7 CPAP -no   Fasting Blood Sugar - 130-140 Checks Blood Sugar _____ times a day occasionally test his sugar with his mother's meter  Blood Thinner Instructions:NA Aspirin Instructions: Last Dose:  Anesthesia review:   Patient denies shortness of breath, fever, cough and chest pain at PAT appointment yes   Patient verbalized understanding of instructions that were given to them at the PAT appointment. Patient was also instructed that they will need to review over the PAT instructions again at home before surgery. Yes  Pt reports that he does get SOB since he became anemic. He is very anxious because he has never had a surgery.  After the colonoscopy they told him that he snored really loud and maybe he has sleep apnea.  Dr. Moreen Fowler was going to address his rising A1c.

## 2020-04-08 ENCOUNTER — Other Ambulatory Visit (HOSPITAL_COMMUNITY)
Admission: RE | Admit: 2020-04-08 | Discharge: 2020-04-08 | Disposition: A | Discharge status: Home / Self Care | Payer: 59 | Source: Ambulatory Visit | Attending: General Surgery | Admitting: General Surgery

## 2020-04-08 DIAGNOSIS — Z20822 Contact with and (suspected) exposure to covid-19: Secondary | ICD-10-CM | POA: Diagnosis not present

## 2020-04-08 DIAGNOSIS — Z01812 Encounter for preprocedural laboratory examination: Secondary | ICD-10-CM | POA: Diagnosis present

## 2020-04-08 LAB — SARS CORONAVIRUS 2 (TAT 6-24 HRS): SARS Coronavirus 2: NEGATIVE

## 2020-04-11 MED ORDER — BUPIVACAINE LIPOSOME 1.3 % IJ SUSP
20.0000 mL | Freq: Once | INTRAMUSCULAR | Status: DC
  Filled 2020-04-11: qty 20

## 2020-04-12 ENCOUNTER — Encounter (HOSPITAL_COMMUNITY): Payer: Self-pay | Admitting: General Surgery

## 2020-04-12 ENCOUNTER — Other Ambulatory Visit: Payer: Self-pay

## 2020-04-12 ENCOUNTER — Inpatient Hospital Stay (HOSPITAL_COMMUNITY)
Admission: RE | Admit: 2020-04-12 | Discharge: 2020-04-15 | DRG: 331 | Disposition: A | Discharge status: Home / Self Care | Payer: 59 | Attending: General Surgery | Admitting: General Surgery

## 2020-04-12 ENCOUNTER — Inpatient Hospital Stay (HOSPITAL_COMMUNITY): Payer: 59 | Admitting: Registered Nurse

## 2020-04-12 ENCOUNTER — Encounter (HOSPITAL_COMMUNITY)
Admission: RE | Disposition: A | Discharge status: Home / Self Care | Payer: Self-pay | Source: Home / Self Care | Attending: General Surgery

## 2020-04-12 DIAGNOSIS — C182 Malignant neoplasm of ascending colon: Principal | ICD-10-CM | POA: Diagnosis present

## 2020-04-12 DIAGNOSIS — Z888 Allergy status to other drugs, medicaments and biological substances status: Secondary | ICD-10-CM | POA: Diagnosis not present

## 2020-04-12 DIAGNOSIS — Z818 Family history of other mental and behavioral disorders: Secondary | ICD-10-CM

## 2020-04-12 DIAGNOSIS — E119 Type 2 diabetes mellitus without complications: Secondary | ICD-10-CM | POA: Diagnosis not present

## 2020-04-12 DIAGNOSIS — I10 Essential (primary) hypertension: Secondary | ICD-10-CM | POA: Diagnosis present

## 2020-04-12 DIAGNOSIS — E7800 Pure hypercholesterolemia, unspecified: Secondary | ICD-10-CM | POA: Diagnosis not present

## 2020-04-12 DIAGNOSIS — K219 Gastro-esophageal reflux disease without esophagitis: Secondary | ICD-10-CM | POA: Diagnosis present

## 2020-04-12 DIAGNOSIS — Z87442 Personal history of urinary calculi: Secondary | ICD-10-CM

## 2020-04-12 DIAGNOSIS — Z8371 Family history of colonic polyps: Secondary | ICD-10-CM | POA: Diagnosis not present

## 2020-04-12 DIAGNOSIS — Z833 Family history of diabetes mellitus: Secondary | ICD-10-CM

## 2020-04-12 DIAGNOSIS — Z8249 Family history of ischemic heart disease and other diseases of the circulatory system: Secondary | ICD-10-CM

## 2020-04-12 DIAGNOSIS — F329 Major depressive disorder, single episode, unspecified: Secondary | ICD-10-CM | POA: Diagnosis present

## 2020-04-12 LAB — TYPE AND SCREEN
ABO/RH(D): O POS
Antibody Screen: NEGATIVE

## 2020-04-12 LAB — ABO/RH: ABO/RH(D): O POS

## 2020-04-12 LAB — GLUCOSE, CAPILLARY: Glucose-Capillary: 120 mg/dL — ABNORMAL HIGH (ref 70–99)

## 2020-04-12 SURGERY — COLECTOMY, PARTIAL, ROBOT-ASSISTED, LAPAROSCOPIC
Anesthesia: General

## 2020-04-12 MED ORDER — ROCURONIUM BROMIDE 10 MG/ML (PF) SYRINGE
PREFILLED_SYRINGE | INTRAVENOUS | Status: AC
  Filled 2020-04-12: qty 10

## 2020-04-12 MED ORDER — ONDANSETRON HCL 4 MG/2ML IJ SOLN
4.0000 mg | Freq: Four times a day (QID) | INTRAMUSCULAR | Status: DC | PRN
  Administered 2020-04-14: 4 mg via INTRAVENOUS
  Filled 2020-04-12 (×2): qty 2

## 2020-04-12 MED ORDER — FENTANYL CITRATE (PF) 250 MCG/5ML IJ SOLN
INTRAMUSCULAR | Status: AC
  Filled 2020-04-12: qty 5

## 2020-04-12 MED ORDER — FERROUS SULFATE 325 (65 FE) MG PO TABS
325.0000 mg | ORAL_TABLET | Freq: Every day | ORAL | Status: DC
  Administered 2020-04-13 – 2020-04-15 (×3): 325 mg via ORAL
  Filled 2020-04-12 (×3): qty 1

## 2020-04-12 MED ORDER — AMLODIPINE BESYLATE 5 MG PO TABS
5.0000 mg | ORAL_TABLET | Freq: Every day | ORAL | Status: DC
  Administered 2020-04-13 – 2020-04-14 (×2): 5 mg via ORAL
  Filled 2020-04-12 (×2): qty 1

## 2020-04-12 MED ORDER — SODIUM CHLORIDE 0.9 % IV SOLN
2.0000 g | INTRAVENOUS | Status: AC
  Administered 2020-04-12: 2 g via INTRAVENOUS
  Filled 2020-04-12: qty 2

## 2020-04-12 MED ORDER — SUCCINYLCHOLINE CHLORIDE 200 MG/10ML IV SOSY
PREFILLED_SYRINGE | INTRAVENOUS | Status: AC
  Filled 2020-04-12: qty 10

## 2020-04-12 MED ORDER — HYDROMORPHONE HCL 1 MG/ML IJ SOLN
0.5000 mg | INTRAMUSCULAR | Status: DC | PRN
  Administered 2020-04-12: 0.5 mg via INTRAVENOUS
  Filled 2020-04-12: qty 0.5

## 2020-04-12 MED ORDER — EPHEDRINE 5 MG/ML INJ
INTRAVENOUS | Status: AC
  Filled 2020-04-12: qty 10

## 2020-04-12 MED ORDER — ONDANSETRON HCL 4 MG/2ML IJ SOLN
INTRAMUSCULAR | Status: AC
  Filled 2020-04-12: qty 2

## 2020-04-12 MED ORDER — DIPHENHYDRAMINE HCL 50 MG/ML IJ SOLN
INTRAMUSCULAR | Status: DC | PRN
  Administered 2020-04-12: 12.5 mg via INTRAVENOUS

## 2020-04-12 MED ORDER — ALVIMOPAN 12 MG PO CAPS
12.0000 mg | ORAL_CAPSULE | Freq: Two times a day (BID) | ORAL | Status: DC
  Administered 2020-04-13: 12 mg via ORAL
  Filled 2020-04-12 (×2): qty 1

## 2020-04-12 MED ORDER — CARVEDILOL 6.25 MG PO TABS
6.2500 mg | ORAL_TABLET | Freq: Two times a day (BID) | ORAL | Status: DC
  Administered 2020-04-12 – 2020-04-15 (×6): 6.25 mg via ORAL
  Filled 2020-04-12 (×6): qty 1

## 2020-04-12 MED ORDER — ONDANSETRON HCL 4 MG PO TABS
4.0000 mg | ORAL_TABLET | Freq: Four times a day (QID) | ORAL | Status: DC | PRN
  Administered 2020-04-12 – 2020-04-13 (×2): 4 mg via ORAL
  Filled 2020-04-12: qty 1

## 2020-04-12 MED ORDER — ALUM & MAG HYDROXIDE-SIMETH 200-200-20 MG/5ML PO SUSP: 30.0000 mL | Freq: Four times a day (QID) | ORAL | Status: DC | PRN

## 2020-04-12 MED ORDER — PROPOFOL 10 MG/ML IV BOLUS
INTRAVENOUS | Status: DC | PRN
  Administered 2020-04-12: 150 mg via INTRAVENOUS

## 2020-04-12 MED ORDER — BUPIVACAINE-EPINEPHRINE 0.25% -1:200000 IJ SOLN
INTRAMUSCULAR | Status: DC | PRN
  Administered 2020-04-12: 30 mL

## 2020-04-12 MED ORDER — DEXAMETHASONE SODIUM PHOSPHATE 10 MG/ML IJ SOLN
INTRAMUSCULAR | Status: AC
  Filled 2020-04-12: qty 1

## 2020-04-12 MED ORDER — BUPIVACAINE-EPINEPHRINE (PF) 0.25% -1:200000 IJ SOLN
INTRAMUSCULAR | Status: AC
  Filled 2020-04-12: qty 30

## 2020-04-12 MED ORDER — ENSURE SURGERY PO LIQD
237.0000 mL | Freq: Two times a day (BID) | ORAL | Status: DC
  Administered 2020-04-12 – 2020-04-14 (×4): 237 mL via ORAL

## 2020-04-12 MED ORDER — ENOXAPARIN SODIUM 40 MG/0.4ML ~~LOC~~ SOLN
40.0000 mg | SUBCUTANEOUS | Status: DC
  Administered 2020-04-13 – 2020-04-15 (×3): 40 mg via SUBCUTANEOUS
  Filled 2020-04-12 (×3): qty 0.4

## 2020-04-12 MED ORDER — SUGAMMADEX SODIUM 200 MG/2ML IV SOLN
INTRAVENOUS | Status: DC | PRN
  Administered 2020-04-12: 400 mg via INTRAVENOUS

## 2020-04-12 MED ORDER — ACETAMINOPHEN 500 MG PO TABS
1000.0000 mg | ORAL_TABLET | ORAL | Status: AC
  Administered 2020-04-12: 1000 mg via ORAL
  Filled 2020-04-12: qty 2

## 2020-04-12 MED ORDER — ALPRAZOLAM 1 MG PO TABS
1.0000 mg | ORAL_TABLET | Freq: Four times a day (QID) | ORAL | Status: DC
  Administered 2020-04-12 – 2020-04-14 (×10): 1 mg via ORAL
  Filled 2020-04-12 (×10): qty 1

## 2020-04-12 MED ORDER — KETAMINE HCL 10 MG/ML IJ SOLN
INTRAMUSCULAR | Status: DC | PRN
Start: 2020-04-12 — End: 2020-04-12
  Administered 2020-04-12: 40 mg via INTRAVENOUS

## 2020-04-12 MED ORDER — GLYCOPYRROLATE 0.2 MG/ML IJ SOLN
INTRAMUSCULAR | Status: DC | PRN
  Administered 2020-04-12: .2 mg via INTRAVENOUS

## 2020-04-12 MED ORDER — MIDAZOLAM HCL 2 MG/2ML IJ SOLN
INTRAMUSCULAR | Status: AC
  Filled 2020-04-12: qty 2

## 2020-04-12 MED ORDER — BUPIVACAINE LIPOSOME 1.3 % IJ SUSP
INTRAMUSCULAR | Status: DC | PRN
  Administered 2020-04-12: 20 mL

## 2020-04-12 MED ORDER — RINGERS IRRIGATION IR SOLN
Status: DC | PRN
  Administered 2020-04-12: 1000 mL

## 2020-04-12 MED ORDER — KCL IN DEXTROSE-NACL 20-5-0.45 MEQ/L-%-% IV SOLN
INTRAVENOUS | Status: DC
  Filled 2020-04-12 (×2): qty 1000

## 2020-04-12 MED ORDER — ALVIMOPAN 12 MG PO CAPS
12.0000 mg | ORAL_CAPSULE | ORAL | Status: AC
  Administered 2020-04-12: 12 mg via ORAL
  Filled 2020-04-12: qty 1

## 2020-04-12 MED ORDER — ORAL CARE MOUTH RINSE: 15.0000 mL | Freq: Once | OROMUCOSAL | Status: AC

## 2020-04-12 MED ORDER — SUCCINYLCHOLINE CHLORIDE 200 MG/10ML IV SOSY
PREFILLED_SYRINGE | INTRAVENOUS | Status: DC | PRN
  Administered 2020-04-12: 140 mg via INTRAVENOUS

## 2020-04-12 MED ORDER — ENSURE PRE-SURGERY PO LIQD
592.0000 mL | Freq: Once | ORAL | Status: DC
  Filled 2020-04-12: qty 592

## 2020-04-12 MED ORDER — 0.9 % SODIUM CHLORIDE (POUR BTL) OPTIME
TOPICAL | Status: DC | PRN
  Administered 2020-04-12: 2000 mL

## 2020-04-12 MED ORDER — GABAPENTIN 300 MG PO CAPS: 300.0000 mg | ORAL_CAPSULE | ORAL | Status: DC

## 2020-04-12 MED ORDER — ACETAMINOPHEN 500 MG PO TABS
1000.0000 mg | ORAL_TABLET | Freq: Four times a day (QID) | ORAL | Status: DC
  Administered 2020-04-12 – 2020-04-15 (×11): 1000 mg via ORAL
  Filled 2020-04-12 (×11): qty 2

## 2020-04-12 MED ORDER — LIDOCAINE 2% (20 MG/ML) 5 ML SYRINGE
INTRAMUSCULAR | Status: DC | PRN
  Administered 2020-04-12: 60 mg via INTRAVENOUS

## 2020-04-12 MED ORDER — ENSURE PRE-SURGERY PO LIQD
296.0000 mL | Freq: Once | ORAL | Status: DC
  Filled 2020-04-12: qty 296

## 2020-04-12 MED ORDER — CITALOPRAM HYDROBROMIDE 20 MG PO TABS
40.0000 mg | ORAL_TABLET | Freq: Every day | ORAL | Status: DC
  Administered 2020-04-13 – 2020-04-14 (×2): 40 mg via ORAL
  Filled 2020-04-12 (×2): qty 2

## 2020-04-12 MED ORDER — LIDOCAINE 2% (20 MG/ML) 5 ML SYRINGE
INTRAMUSCULAR | Status: AC
  Filled 2020-04-12: qty 5

## 2020-04-12 MED ORDER — LIDOCAINE 20MG/ML (2%) 15 ML SYRINGE OPTIME
INTRAMUSCULAR | Status: DC | PRN
  Administered 2020-04-12: 1.5 mg/kg/h via INTRAVENOUS

## 2020-04-12 MED ORDER — EPHEDRINE SULFATE-NACL 50-0.9 MG/10ML-% IV SOSY
PREFILLED_SYRINGE | INTRAVENOUS | Status: DC | PRN
  Administered 2020-04-12 (×2): 10 mg via INTRAVENOUS

## 2020-04-12 MED ORDER — MIDAZOLAM HCL 5 MG/5ML IJ SOLN
INTRAMUSCULAR | Status: DC | PRN
  Administered 2020-04-12: 2 mg via INTRAVENOUS

## 2020-04-12 MED ORDER — DIPHENHYDRAMINE HCL 25 MG PO CAPS: 25.0000 mg | ORAL_CAPSULE | Freq: Four times a day (QID) | ORAL | Status: DC | PRN

## 2020-04-12 MED ORDER — FENTANYL CITRATE (PF) 250 MCG/5ML IJ SOLN
INTRAMUSCULAR | Status: DC | PRN
  Administered 2020-04-12 (×3): 50 ug via INTRAVENOUS
  Administered 2020-04-12: 100 ug via INTRAVENOUS

## 2020-04-12 MED ORDER — LACTATED RINGERS IV SOLN: INTRAVENOUS | Status: DC

## 2020-04-12 MED ORDER — ATORVASTATIN CALCIUM 40 MG PO TABS
40.0000 mg | ORAL_TABLET | Freq: Every day | ORAL | Status: DC
  Administered 2020-04-12 – 2020-04-14 (×3): 40 mg via ORAL
  Filled 2020-04-12 (×3): qty 1

## 2020-04-12 MED ORDER — DIPHENHYDRAMINE HCL 50 MG/ML IJ SOLN: 25.0000 mg | Freq: Four times a day (QID) | INTRAMUSCULAR | Status: DC | PRN

## 2020-04-12 MED ORDER — LIDOCAINE HCL 2 % IJ SOLN
INTRAMUSCULAR | Status: AC
  Filled 2020-04-12: qty 20

## 2020-04-12 MED ORDER — SACCHAROMYCES BOULARDII 250 MG PO CAPS
250.0000 mg | ORAL_CAPSULE | Freq: Two times a day (BID) | ORAL | Status: DC
  Administered 2020-04-12 – 2020-04-14 (×5): 250 mg via ORAL
  Filled 2020-04-12 (×5): qty 1

## 2020-04-12 MED ORDER — PANTOPRAZOLE SODIUM 40 MG PO TBEC
40.0000 mg | DELAYED_RELEASE_TABLET | Freq: Every day | ORAL | Status: DC
  Administered 2020-04-13 – 2020-04-14 (×2): 40 mg via ORAL
  Filled 2020-04-12 (×2): qty 1

## 2020-04-12 MED ORDER — PROPOFOL 10 MG/ML IV BOLUS
INTRAVENOUS | Status: AC
  Filled 2020-04-12: qty 40

## 2020-04-12 MED ORDER — KETAMINE HCL 10 MG/ML IJ SOLN
INTRAMUSCULAR | Status: AC
  Filled 2020-04-12: qty 1

## 2020-04-12 MED ORDER — CHLORHEXIDINE GLUCONATE 0.12 % MT SOLN
15.0000 mL | Freq: Once | OROMUCOSAL | Status: AC
  Administered 2020-04-12: 15 mL via OROMUCOSAL

## 2020-04-12 MED ORDER — SIMETHICONE 80 MG PO CHEW
40.0000 mg | CHEWABLE_TABLET | Freq: Four times a day (QID) | ORAL | Status: DC | PRN
  Administered 2020-04-12 – 2020-04-13 (×2): 40 mg via ORAL
  Filled 2020-04-12 (×2): qty 1

## 2020-04-12 MED ORDER — ROCURONIUM BROMIDE 10 MG/ML (PF) SYRINGE
PREFILLED_SYRINGE | INTRAVENOUS | Status: DC | PRN
  Administered 2020-04-12: 60 mg via INTRAVENOUS
  Administered 2020-04-12: 20 mg via INTRAVENOUS
  Administered 2020-04-12 (×2): 10 mg via INTRAVENOUS

## 2020-04-12 MED ORDER — ONDANSETRON HCL 4 MG/2ML IJ SOLN
INTRAMUSCULAR | Status: DC | PRN
  Administered 2020-04-12: 4 mg via INTRAVENOUS

## 2020-04-12 MED ORDER — SODIUM CHLORIDE 0.9 % IV SOLN
2.0000 g | Freq: Two times a day (BID) | INTRAVENOUS | Status: AC
  Administered 2020-04-12: 2 g via INTRAVENOUS
  Filled 2020-04-12: qty 2

## 2020-04-12 MED ORDER — HYDROMORPHONE HCL 1 MG/ML IJ SOLN: 0.2500 mg | INTRAMUSCULAR | Status: DC | PRN

## 2020-04-12 MED ORDER — DIPHENHYDRAMINE HCL 50 MG/ML IJ SOLN
INTRAMUSCULAR | Status: AC
  Filled 2020-04-12: qty 1

## 2020-04-12 SURGICAL SUPPLY — 92 items
ADH SKN CLS APL DERMABOND .7 (GAUZE/BANDAGES/DRESSINGS) ×1
BLADE EXTENDED COATED 6.5IN (ELECTRODE) IMPLANT
CANNULA REDUC XI 12-8 STAPL (CANNULA) ×2
CANNULA REDUCER 12-8 DVNC XI (CANNULA) ×1 IMPLANT
CELLS DAT CNTRL 66122 CELL SVR (MISCELLANEOUS) IMPLANT
COVER SURGICAL LIGHT HANDLE (MISCELLANEOUS) ×4 IMPLANT
COVER TIP SHEARS 8 DVNC (MISCELLANEOUS) ×1 IMPLANT
COVER TIP SHEARS 8MM DA VINCI (MISCELLANEOUS) ×2
COVER WAND RF STERILE (DRAPES) ×2 IMPLANT
DECANTER SPIKE VIAL GLASS SM (MISCELLANEOUS) IMPLANT
DERMABOND ADVANCED (GAUZE/BANDAGES/DRESSINGS) ×1
DERMABOND ADVANCED .7 DNX12 (GAUZE/BANDAGES/DRESSINGS) IMPLANT
DRAIN CHANNEL 19F RND (DRAIN) IMPLANT
DRAPE ARM DVNC X/XI (DISPOSABLE) ×4 IMPLANT
DRAPE COLUMN DVNC XI (DISPOSABLE) ×1 IMPLANT
DRAPE DA VINCI XI ARM (DISPOSABLE) ×8
DRAPE DA VINCI XI COLUMN (DISPOSABLE) ×2
DRAPE SURG IRRIG POUCH 19X23 (DRAPES) ×2 IMPLANT
DRSG OPSITE POSTOP 4X10 (GAUZE/BANDAGES/DRESSINGS) IMPLANT
DRSG OPSITE POSTOP 4X6 (GAUZE/BANDAGES/DRESSINGS) ×1 IMPLANT
DRSG OPSITE POSTOP 4X8 (GAUZE/BANDAGES/DRESSINGS) IMPLANT
ELECT PENCIL ROCKER SW 15FT (MISCELLANEOUS) ×2 IMPLANT
ELECT REM PT RETURN 15FT ADLT (MISCELLANEOUS) ×2 IMPLANT
ENDOLOOP SUT PDS II  0 18 (SUTURE)
ENDOLOOP SUT PDS II 0 18 (SUTURE) IMPLANT
EVACUATOR SILICONE 100CC (DRAIN) IMPLANT
GLOVE BIO SURGEON STRL SZ 6.5 (GLOVE) ×6 IMPLANT
GLOVE BIOGEL PI IND STRL 7.0 (GLOVE) ×2 IMPLANT
GLOVE BIOGEL PI INDICATOR 7.0 (GLOVE) ×2
GOWN STRL REUS W/TWL XL LVL3 (GOWN DISPOSABLE) ×6 IMPLANT
GRASPER SUT TROCAR 14GX15 (MISCELLANEOUS) IMPLANT
HOLDER FOLEY CATH W/STRAP (MISCELLANEOUS) ×2 IMPLANT
IRRIG SUCT STRYKERFLOW 2 WTIP (MISCELLANEOUS) ×2
IRRIGATION SUCT STRKRFLW 2 WTP (MISCELLANEOUS) ×1 IMPLANT
KIT PROCEDURE DA VINCI SI (MISCELLANEOUS)
KIT PROCEDURE DVNC SI (MISCELLANEOUS) IMPLANT
KIT TURNOVER KIT A (KITS) IMPLANT
NDL INSUFFLATION 14GA 120MM (NEEDLE) ×1 IMPLANT
NEEDLE INSUFFLATION 14GA 120MM (NEEDLE) ×2 IMPLANT
PACK CARDIOVASCULAR III (CUSTOM PROCEDURE TRAY) ×2 IMPLANT
PACK COLON (CUSTOM PROCEDURE TRAY) ×2 IMPLANT
PAD POSITIONING PINK XL (MISCELLANEOUS) ×2 IMPLANT
PORT LAP GEL ALEXIS MED 5-9CM (MISCELLANEOUS) IMPLANT
RELOAD STAPLE 60 3.5 BLU DVNC (STAPLE) IMPLANT
RELOAD STAPLE 60 4.3 GRN DVNC (STAPLE) IMPLANT
RELOAD STAPLER 3.5X60 BLU DVNC (STAPLE) ×3 IMPLANT
RELOAD STAPLER 4.3X60 GRN DVNC (STAPLE) ×1 IMPLANT
RETRACTOR WND ALEXIS 18 MED (MISCELLANEOUS) IMPLANT
RTRCTR WOUND ALEXIS 18CM MED (MISCELLANEOUS)
SCISSORS LAP 5X35 DISP (ENDOMECHANICALS) IMPLANT
SEAL CANN UNIV 5-8 DVNC XI (MISCELLANEOUS) ×3 IMPLANT
SEAL XI 5MM-8MM UNIVERSAL (MISCELLANEOUS) ×6
SEALER VESSEL DA VINCI XI (MISCELLANEOUS) ×2
SEALER VESSEL EXT DVNC XI (MISCELLANEOUS) ×1 IMPLANT
SOLUTION ELECTROLUBE (MISCELLANEOUS) ×2 IMPLANT
STAPLER 45 DA VINCI SURE FORM (STAPLE)
STAPLER 45 SUREFORM DVNC (STAPLE) IMPLANT
STAPLER 60 DA VINCI SURE FORM (STAPLE) ×2
STAPLER 60 SUREFORM DVNC (STAPLE) ×1 IMPLANT
STAPLER CANNULA SEAL DVNC XI (STAPLE) IMPLANT
STAPLER CANNULA SEAL XI (STAPLE)
STAPLER RELOAD 3.5X60 BLU DVNC (STAPLE) ×3
STAPLER RELOAD 3.5X60 BLUE (STAPLE) ×6
STAPLER RELOAD 4.3X60 GREEN (STAPLE) ×2
STAPLER RELOAD 4.3X60 GRN DVNC (STAPLE) ×1
STOPCOCK 4 WAY LG BORE MALE ST (IV SETS) ×4 IMPLANT
SUT ETHILON 2 0 PS N (SUTURE) IMPLANT
SUT NOVA NAB DX-16 0-1 5-0 T12 (SUTURE) ×4 IMPLANT
SUT PROLENE 2 0 KS (SUTURE) IMPLANT
SUT SILK 2 0 (SUTURE) ×2
SUT SILK 2 0 SH CR/8 (SUTURE) IMPLANT
SUT SILK 2-0 18XBRD TIE 12 (SUTURE) ×1 IMPLANT
SUT SILK 3 0 (SUTURE)
SUT SILK 3 0 SH CR/8 (SUTURE) ×2 IMPLANT
SUT SILK 3-0 18XBRD TIE 12 (SUTURE) IMPLANT
SUT V-LOC BARB 180 2/0GR6 GS22 (SUTURE)
SUT VIC AB 2-0 SH 18 (SUTURE) IMPLANT
SUT VIC AB 2-0 SH 27 (SUTURE)
SUT VIC AB 2-0 SH 27X BRD (SUTURE) IMPLANT
SUT VIC AB 3-0 SH 18 (SUTURE) IMPLANT
SUT VIC AB 4-0 PS2 27 (SUTURE) ×5 IMPLANT
SUT VICRYL 0 UR6 27IN ABS (SUTURE) ×2 IMPLANT
SUTURE V-LC BRB 180 2/0GR6GS22 (SUTURE) IMPLANT
SYR 10ML ECCENTRIC (SYRINGE) ×2 IMPLANT
SYS LAPSCP GELPORT 120MM (MISCELLANEOUS)
SYSTEM LAPSCP GELPORT 120MM (MISCELLANEOUS) IMPLANT
TOWEL OR 17X26 10 PK STRL BLUE (TOWEL DISPOSABLE) IMPLANT
TOWEL OR NON WOVEN STRL DISP B (DISPOSABLE) ×2 IMPLANT
TRAY FOLEY MTR SLVR 16FR STAT (SET/KITS/TRAYS/PACK) ×2 IMPLANT
TROCAR ADV FIXATION 5X100MM (TROCAR) ×2 IMPLANT
TUBING CONNECTING 10 (TUBING) ×4 IMPLANT
TUBING INSUFFLATION 10FT LAP (TUBING) ×2 IMPLANT

## 2020-04-12 NOTE — Anesthesia Procedure Notes (Signed)
Procedure Name: Intubation Date/Time: 04/12/2020 11:23 AM Performed by: Talbot Grumbling, CRNA Pre-anesthesia Checklist: Patient identified, Emergency Drugs available, Suction available and Patient being monitored Patient Re-evaluated:Patient Re-evaluated prior to induction Oxygen Delivery Method: Circle system utilized Preoxygenation: Pre-oxygenation with 100% oxygen Induction Type: IV induction Ventilation: Two handed mask ventilation required Laryngoscope Size: Glidescope (LoPro 3) Grade View: Grade I Tube type: Oral Tube size: 7.5 mm Number of attempts: 1 Airway Equipment and Method: Stylet and Video-laryngoscopy Placement Confirmation: ETT inserted through vocal cords under direct vision,  positive ETCO2 and breath sounds checked- equal and bilateral Secured at: 22 cm Tube secured with: Tape Dental Injury: Teeth and Oropharynx as per pre-operative assessment

## 2020-04-12 NOTE — Transfer of Care (Signed)
Immediate Anesthesia Transfer of Care Note  Patient: Anthony [place]  Procedure(s) Performed: ROBOTIC RIGHT COLECTOMY (N/A )  Patient Location: PACU  Anesthesia Type:General  Level of Consciousness: sedated  Airway & Oxygen Therapy: Patient Spontanous Breathing and Patient connected to face mask oxygen  Post-op Assessment: Report given to RN and Post -op Vital signs reviewed and stable  Post vital signs: Reviewed and stable  Last Vitals:  Vitals Value Taken Time  BP 149/114 04/12/20 1407  Temp    Pulse 80 04/12/20 1408  Resp 17 04/12/20 1408  SpO2 97 % 04/12/20 1408  Vitals shown include unvalidated device data.  Last Pain:  Vitals:   04/12/20 0830  TempSrc: Oral  PainSc:       Patients Stated Pain Goal: 4 (74/45/14 6047)  Complications: No complications documented.

## 2020-04-12 NOTE — Op Note (Signed)
PRE-OPERATIVE DIAGNOSIS:  COLON CANCER  POST-OPERATIVE DIAGNOSIS:  COLON CANCER  PROCEDURE:   ROBOTIC ASSISTED RIGHT COLECTOMY    Surgeon: Leighton Ruff, MD   ASSISTANT: Dr Dema Severin   ANESTHESIA:   local and general  EBL: 64ml Total I/O In: 500 [I.V.:400; IV Piggyback:100] Out: 850 [Urine:800; Blood:50]  Delay start of Pharmacological VTE agent (>24hrs) due to surgical blood loss or risk of bleeding:  no  DRAINS: none   SPECIMEN:  Source of Specimen:  Ascending and transverse colon  DISPOSITION OF SPECIMEN:  PATHOLOGY  COUNTS:  YES  PLAN OF CARE: Admit to inpatient   PATIENT DISPOSITION:  PACU - hemodynamically stable.  INDICATION:    54 y.o. M who was noted to have a mass in his cecum.  This was tattooed distally.  I recommended segmental resection:  The anatomy & physiology of the digestive tract was discussed.  The pathophysiology was discussed.  Natural history risks without surgery was discussed.   I worked to give an overview of the disease and the frequent need to have multispecialty involvement.  I feel the risks of no intervention will lead to serious problems that outweigh the operative risks; therefore, I recommended a partial colectomy to remove the pathology.  Laparoscopic & open techniques were discussed.   Risks such as bleeding, infection, abscess, leak, reoperation, possible ostomy, hernia, heart attack, death, and other risks were discussed.  I noted a good likelihood this will help address the problem.   Goals of post-operative recovery were discussed as well.    The patient expressed understanding & wished to proceed with surgery.  OR FINDINGS:   Patient had tattoo noted in the mid ascending colon   DESCRIPTION:   Informed consent was confirmed.  The patient underwent general anaesthesia without difficulty.  The patient was positioned appropriately.  VTE prevention in place.  The patient's abdomen was clipped, prepped, & draped  in a sterile fashion.  Surgical timeout confirmed our plan.  The patient was positioned in reverse Trendelenburg.  Abdominal entry was gained using a Varies needle in the LUQ.  Entry was clean.  I induced carbon dioxide insufflation.  An 85mm robotic port was placed in the LLQ.  Camera inspection revealed no injury.  Extra ports were carefully placed under direct laparoscopic visualization.  I laparoscopically reflected the greater omentum and the upper abdomen the small bowel in the upper abdomen. The patient was appropriately positioned and the robot was docked to the patient's right side.  Instruments were placed under direct visualization.   I inspected the abdomen.  There was a large amount of tattoo in the mid transverse colon.  There were no other signs of pathology noted.   I began by identifying the ileocolic artery and vein within the mesentery. Dissection was bluntly carried around these structures. The duodenum was identified and free from the structures. I then separated the structures bluntly and used the robotic vessel sealer device to transect these separately.  I developed the retroperitoneal plane bluntly.  I then freed the appendix off its attachments to the pelvic wall. I mobilized the terminal ileum.  I took care to avoid injuring any retroperitoneal structures.  After this I began to mobilize laterally down the white line of [place] and then took down the hepatic flexure using the Enseal device. I mobilized the omentum off of the right transverse colon. The entire colon was then flipped medially and mobilized off of the retroperitoneal structures until I could visualize the lateral edge of  the duodenum underneath.  I gently freed the duodenal attachments.  I then divided the remaining mesentery to the level of the tattooed colon.  This was done using a robotic vessel sealer.  I then divided the colon using a green load 60 mm robotic stapler.  I divided the terminal ileum in a similar  fashion using a blue load stapler.  This freed the specimen.  I placed this in the right upper quadrant.  I then oriented the colon and remaining small bowel in a isoperistaltic manner.  I used robotic scissors to make a small enterotomy in the small bowel and colon.  A 60 mm blue load stapler was then used to create an anastomosis between the remaining small bowel and colon.  The common enterotomy channel was then closed using 2, 2-0 V-Loc sutures. These needles were then removed after the suture was cut.  Hemostasis was good.  The abdomen was irrigated with 1 L of warm normal saline.  The robot was then undocked.  The 12 mm suprapubic port was enlarged to a Pfannenstiel incision.  An [place] wound protector was placed.  The colon was removed through the Pfannenstiel.  We then switched to clean gloves.  The peritoneum was closed using a running 0 Vicryl suture.  The fascia was closed using interrupted #1 Novafil sutures.  The subcutaneous tissue was reapproximated using a 2-0 Vicryl suture and the skin was closed using a running subcuticular 4-0 Vicryl suture.  A sterile dressing was applied.  The port sites were closed using 4-0 Vicryl sutures and Dermabond.  The patient was awakened from anesthesia and sent to the postanesthesia care unit in stable condition.  All counts were correct per operating room staff.  An MD/PA assistant was necessary for tissue manipulation, retraction and positioning due to the complexity of the case and hospital policies

## 2020-04-12 NOTE — Anesthesia Preprocedure Evaluation (Addendum)
Anesthesia Evaluation  Patient identified by MRN, date of birth, ID band Patient awake    Reviewed: Allergy & Precautions, H&P , NPO status , Patient's Chart, lab work & pertinent test results, reviewed documented beta blocker date and time   Airway Mallampati: IV  TM Distance: >3 FB Neck ROM: Full    Dental no notable dental hx. (+) Teeth Intact, Dental Advisory Given   Pulmonary neg pulmonary ROS,    Pulmonary exam normal breath sounds clear to auscultation       Cardiovascular hypertension, Pt. on medications and Pt. on home beta blockers  Rhythm:Regular Rate:Normal     Neuro/Psych Anxiety Depression negative neurological ROS     GI/Hepatic Neg liver ROS, GERD  Medicated and Controlled,  Endo/Other  negative endocrine ROSMorbid obesity  Renal/GU negative Renal ROS  negative genitourinary   Musculoskeletal  (+) Arthritis , Osteoarthritis,    Abdominal   Peds  Hematology  (+) Blood dyscrasia, anemia ,   Anesthesia Other Findings   Reproductive/Obstetrics negative OB ROS                            Anesthesia Physical Anesthesia Plan  ASA: III  Anesthesia Plan: General   Post-op Pain Management:    Induction: Intravenous  PONV Risk Score and Plan: 3 and Ondansetron, Midazolam and Diphenhydramine  Airway Management Planned: Oral ETT and Video Laryngoscope Planned  Additional Equipment:   Intra-op Plan:   Post-operative Plan: Extubation in OR  Informed Consent: I have reviewed the patients History and Physical, chart, labs and discussed the procedure including the risks, benefits and alternatives for the proposed anesthesia with the patient or authorized representative who has indicated his/her understanding and acceptance.     Dental advisory given  Plan Discussed with: CRNA  Anesthesia Plan Comments:        Anesthesia Quick Evaluation

## 2020-04-12 NOTE — H&P (Signed)
The patient is a 54 year old male who presents with colorectal cancer. 54 year old male who presents to the office for evaluation of a newly diagnosed colon adenocarcinoma. Patient was noted to be anemic on his physical exam and fit test was positive. A colonoscopy was performed. This showed a couple transverse colon polyps which were resected and a mass in the cecum which was biopsied. Biopsies show adenocarcinoma. CT scans of the chest, abdomen and pelvis show no signs of metastatic disease.   Past Surgical History  Colon Polyp Removal - Colonoscopy  Diagnostic Studies History Colonoscopy within last year  Allergies  predniSONE *CORTICOSTEROIDS* Allergies Reconciled  Medication History  amLODIPine Besylate (5MG  Tablet, Oral) Active. ALPRAZolam (1MG  Tablet, Oral) Active. Carvedilol (6.25MG  Tablet, Oral) Active. Citalopram Hydrobromide (40MG  Tablet, Oral) Active. Omeprazole (20MG  Capsule DR, Oral) Active. Atorvastatin Calcium (40MG  Tablet, Oral) Active. Medications Reconciled  Social HistoryCaffeine use Carbonated beverages. No alcohol use No drug use Tobacco use Never smoker.  Family History ([place], [place]; 03/03/2020 2:02 PM) Arthritis Family Members In General, Mother. Bleeding disorder Mother. Cancer Brother, Mother. Cerebrovascular Accident Mother. Colon Polyps Mother. Depression Daughter, Family Members In [place], Mother, Son. Diabetes Mellitus Mother. Hypertension Mother. Thyroid problems Mother.  Other Problems  Anxiety Disorder Arthritis Colon Cancer Depression Diabetes Mellitus Gastroesophageal Reflux Disease High blood pressure Hypercholesterolemia Kidney Stone Sleep Apnea     Review of Systems  General Present- Fatigue. Not Present- Appetite Loss, Chills, Fever, Night Sweats, Weight Gain and Weight Loss. HEENT Present- Hearing Loss, Ringing in the Ears and Wears glasses/contact lenses. Not  Present- Earache, Hoarseness, Nose Bleed, Oral Ulcers, Seasonal Allergies, Sinus Pain, Sore Throat, Visual Disturbances and Yellow Eyes. Respiratory Present- Snoring. Not Present- Bloody sputum, Chronic Cough, Difficulty Breathing and Wheezing. Breast Not Present- Breast Mass, Breast Pain, Nipple Discharge and Skin Changes. Cardiovascular Present- Difficulty Breathing Lying Down and Shortness of Breath. Not Present- Chest Pain, Leg Cramps, Palpitations, Rapid Heart Rate and Swelling of Extremities. Gastrointestinal Present- Bloating, Excessive gas and Indigestion. Not Present- Abdominal Pain, Bloody Stool, Change in Bowel Habits, Chronic diarrhea, Constipation, Difficulty Swallowing, Gets full quickly at meals, Hemorrhoids, Nausea, Rectal Pain and Vomiting. Male Genitourinary Not Present- Blood in Urine, Change in Urinary Stream, Frequency, Impotence, Nocturia, Painful Urination, Urgency and Urine Leakage. Musculoskeletal Present- Back Pain and Muscle Weakness. Not Present- Joint Pain, Joint Stiffness, Muscle Pain and Swelling of Extremities. Neurological Present- Weakness. Not Present- Decreased Memory, Fainting, Headaches, Numbness, Seizures, Tingling, Tremor and Trouble walking. Psychiatric Present- Anxiety, Depression and Fearful. Not Present- Bipolar, Change in Sleep Pattern and Frequent crying. Endocrine Present- New Diabetes. Not Present- Cold Intolerance, Excessive Hunger, Hair Changes, Heat Intolerance and Hot flashes. Hematology Not Present- Blood Thinners, Easy Bruising, Excessive bleeding, Gland problems, HIV and Persistent Infections.  BP 134/77   Pulse 74   Temp 97.7 F (36.5 C) (Oral)   Resp 18   Ht 5\' 6"  (6.256 m)   Wt 126.7 kg   SpO2 93%   BMI 45.07 kg/m    Physical Exam (  General Mental Status-Alert. General Appearance-Cooperative. CV: RRR Lungs: CTA Abdomen Palpation/Percussion Palpation and Percussion of the abdomen reveal - Soft and Non  Tender.   Assessment & Plan   COLON CANCER, ASCENDING (C18.2) Impression: 54 year old male who presents to the office for evaluation of a newly diagnosed ascending colon cancer. This was found on colonoscopy which was performed due to anemia and a positive fit test. A cecal mass was noted. Biopsy showed adenocarcinoma. Distal area was tattooed. CT  scans of the chest, abdomen and pelvis showed no signs of metastatic disease. The surgery and anatomy were described to the patient as well as the risks of surgery and the possible complications. These include: Bleeding, deep abdominal infections and possible wound complications such as hernia and infection, damage to adjacent structures, leak of surgical connections, which can lead to other surgeries and possibly an ostomy, possible need for other procedures, such as abscess drains in radiology, possible prolonged hospital stay, possible diarrhea from removal of part of the colon, possible constipation from narcotics, possible bowel, bladder or sexual dysfunction if having rectal surgery, prolonged fatigue/weakness or appetite loss, possible early recurrence of of disease, possible complications of their medical problems such as heart disease or arrhythmias or lung problems, death (less than 1%). I believe the patient understands and wishes to proceed with the surgery.

## 2020-04-12 NOTE — Anesthesia Postprocedure Evaluation (Signed)
Anesthesia Post Note  Patient: Phillipe Clemon Nevers  Procedure(s) Performed: ROBOTIC RIGHT COLECTOMY (N/A )     Patient location during evaluation: PACU Anesthesia Type: General Level of consciousness: awake and alert Pain management: pain level controlled Vital Signs Assessment: post-procedure vital signs reviewed and stable Respiratory status: spontaneous breathing, nonlabored ventilation, respiratory function stable and patient connected to nasal cannula oxygen Cardiovascular status: blood pressure returned to baseline and stable Postop Assessment: no apparent nausea or vomiting Anesthetic complications: no   No complications documented.  Last Vitals:  Vitals:   04/12/20 1430 04/12/20 1445  BP: 126/81 139/81  Pulse: 78 78  Resp: 11 14  Temp:    SpO2: 94% 97%    Last Pain:  Vitals:   04/12/20 1445  TempSrc:   PainSc: 0-No pain                 Dean Goldner,W. EDMOND

## 2020-04-13 LAB — BASIC METABOLIC PANEL
Anion gap: 9 (ref 5–15)
BUN: 8 mg/dL (ref 6–20)
CO2: 26 mmol/L (ref 22–32)
Calcium: 8.1 mg/dL — ABNORMAL LOW (ref 8.9–10.3)
Chloride: 100 mmol/L (ref 98–111)
Creatinine, Ser: 0.96 mg/dL (ref 0.61–1.24)
GFR calc Af Amer: >60 mL/min (ref >60)
GFR calc non Af Amer: >60 mL/min (ref >60)
Glucose, Bld: 150 mg/dL — ABNORMAL HIGH (ref 70–99)
Potassium: 3.4 mmol/L — ABNORMAL LOW (ref 3.5–5.1)
Sodium: 135 mmol/L (ref 135–145)

## 2020-04-13 LAB — CBC
HCT: 37.8 % — ABNORMAL LOW (ref 39.0–52.0)
Hemoglobin: 11.3 g/dL — ABNORMAL LOW (ref 13.0–17.0)
MCH: 24.7 pg — ABNORMAL LOW (ref 26.0–34.0)
MCHC: 29.9 g/dL — ABNORMAL LOW (ref 30.0–36.0)
MCV: 82.5 fL (ref 80.0–100.0)
Platelets: 364 10*3/uL (ref 150–400)
RBC: 4.58 MIL/uL (ref 4.22–5.81)
RDW: 15.9 % — ABNORMAL HIGH (ref 11.5–15.5)
WBC: 13.2 10*3/uL — ABNORMAL HIGH (ref 4.0–10.5)
nRBC: 0 % (ref 0.0–0.2)

## 2020-04-13 MED ORDER — TRAMADOL HCL 50 MG PO TABS
50.0000 mg | ORAL_TABLET | Freq: Four times a day (QID) | ORAL | Status: DC | PRN
  Administered 2020-04-13 – 2020-04-14 (×2): 50 mg via ORAL
  Filled 2020-04-13 (×2): qty 1

## 2020-04-13 MED ORDER — GABAPENTIN 100 MG PO CAPS: 200.0000 mg | ORAL_CAPSULE | Freq: Three times a day (TID) | ORAL | Status: DC

## 2020-04-13 MED ORDER — METHOCARBAMOL 500 MG PO TABS
500.0000 mg | ORAL_TABLET | Freq: Four times a day (QID) | ORAL | Status: DC | PRN
  Administered 2020-04-13 – 2020-04-14 (×2): 500 mg via ORAL
  Filled 2020-04-13 (×2): qty 1

## 2020-04-13 NOTE — Progress Notes (Signed)
1 Day Post-Op Robotic R Colectomy Subjective: Feels bloated and sore.  No flatus yet.  Occasional nausea  Objective: Vital signs in last 24 hours: Temp:  [97.7 F (36.5 C)-99.2 F (37.3 C)] 99.2 F (37.3 C) (08/17 0629) Pulse Rate:  [74-95] 85 (08/17 0629) Resp:  [10-19] 18 (08/17 0629) BP: (112-151)/(68-87) 132/82 (08/17 0629) SpO2:  [92 %-98 %] 93 % (08/17 0629)   Intake/Output from previous day: 08/16 0701 - 08/17 0700 In: 2180 [P.O.:1080; I.V.:1000; IV Piggyback:100] Out: 3625 [Urine:3575; Blood:50] Intake/Output this shift: No intake/output data recorded.   General appearance: alert and cooperative GI: normal findings: soft, mildly distended  Incision: no significant drainage  Lab Results:  Recent Labs    04/13/20 0515  WBC 13.2*  HGB 11.3*  HCT 37.8*  PLT 364   BMET Recent Labs    04/13/20 0515  NA 135  K 3.4*  CL 100  CO2 26  GLUCOSE 150*  BUN 8  CREATININE 0.96  CALCIUM 8.1*   PT/INR No results for input(s): LABPROT, INR in the last 72 hours. ABG No results for input(s): PHART, HCO3 in the last 72 hours.  Invalid input(s): PCO2, PO2  MEDS, Scheduled . acetaminophen  1,000 mg Oral Q6H  . ALPRAZolam  1 mg Oral QID  . alvimopan  12 mg Oral BID  . amLODipine  5 mg Oral Daily  . atorvastatin  40 mg Oral Daily  . carvedilol  6.25 mg Oral BID WC  . citalopram  40 mg Oral Daily  . enoxaparin (LOVENOX) injection  40 mg Subcutaneous Q24H  . feeding supplement  237 mL Oral BID BM  . ferrous sulfate  325 mg Oral Q breakfast  . pantoprazole  40 mg Oral Daily  . saccharomyces boulardii  250 mg Oral BID    Studies/Results: No results found.  Assessment: s/p Procedure(s): ROBOTIC RIGHT COLECTOMY Patient Active Problem List   Diagnosis Date Noted  . Colon cancer, ascending ([place]) 04/12/2020    Expected post op course  Plan: d/c foley  Ambulate SL IVF's Cont fulls until bowel function returns   LOS: 1 day     .Rosario Adie,  MD [facility], [place]    04/13/2020 8:26 AM

## 2020-04-14 LAB — CBC
HCT: 35.1 % — ABNORMAL LOW (ref 39.0–52.0)
Hemoglobin: 10.8 g/dL — ABNORMAL LOW (ref 13.0–17.0)
MCH: 25.1 pg — ABNORMAL LOW (ref 26.0–34.0)
MCHC: 30.8 g/dL (ref 30.0–36.0)
MCV: 81.4 fL (ref 80.0–100.0)
Platelets: 309 10*3/uL (ref 150–400)
RBC: 4.31 MIL/uL (ref 4.22–5.81)
RDW: 15.8 % — ABNORMAL HIGH (ref 11.5–15.5)
WBC: 10.3 10*3/uL (ref 4.0–10.5)
nRBC: 0 % (ref 0.0–0.2)

## 2020-04-14 LAB — BASIC METABOLIC PANEL
Anion gap: 10 (ref 5–15)
BUN: 6 mg/dL (ref 6–20)
CO2: 26 mmol/L (ref 22–32)
Calcium: 7.9 mg/dL — ABNORMAL LOW (ref 8.9–10.3)
Chloride: 102 mmol/L (ref 98–111)
Creatinine, Ser: 0.89 mg/dL (ref 0.61–1.24)
GFR calc Af Amer: >60 mL/min (ref >60)
GFR calc non Af Amer: >60 mL/min (ref >60)
Glucose, Bld: 112 mg/dL — ABNORMAL HIGH (ref 70–99)
Potassium: 3.4 mmol/L — ABNORMAL LOW (ref 3.5–5.1)
Sodium: 138 mmol/L (ref 135–145)

## 2020-04-14 NOTE — Progress Notes (Signed)
2 Days Post-Op Robotic R Colectomy Subjective: Feels much better.  Having BM's.  No further nausea  Objective: Vital signs in last 24 hours: Temp:  [98.1 F (36.7 C)-99.6 F (37.6 C)] 99.6 F (37.6 C) (08/18 0536) Pulse Rate:  [78-95] 95 (08/18 0536) Resp:  [18-20] 18 (08/18 0536) BP: (114-133)/(65-84) 133/84 (08/18 0536) SpO2:  [90 %-92 %] 92 % (08/18 0536)   Intake/Output from previous day: 08/17 0701 - 08/18 0700 In: 1600 [P.O.:1600] Out: 0  Intake/Output this shift: No intake/output data recorded.   General appearance: alert and cooperative GI: normal findings: soft, mildly distended  Incision: no significant drainage  Lab Results:  Recent Labs    04/13/20 0515 04/14/20 0458  WBC 13.2* 10.3  HGB 11.3* 10.8*  HCT 37.8* 35.1*  PLT 364 309   BMET Recent Labs    04/13/20 0515 04/14/20 0458  NA 135 138  K 3.4* 3.4*  CL 100 102  CO2 26 26  GLUCOSE 150* 112*  BUN 8 6  CREATININE 0.96 0.89  CALCIUM 8.1* 7.9*   PT/INR No results for input(s): LABPROT, INR in the last 72 hours. ABG No results for input(s): PHART, HCO3 in the last 72 hours.  Invalid input(s): PCO2, PO2  MEDS, Scheduled . acetaminophen  1,000 mg Oral Q6H  . ALPRAZolam  1 mg Oral QID  . amLODipine  5 mg Oral Daily  . atorvastatin  40 mg Oral Daily  . carvedilol  6.25 mg Oral BID WC  . citalopram  40 mg Oral Daily  . enoxaparin (LOVENOX) injection  40 mg Subcutaneous Q24H  . feeding supplement  237 mL Oral BID BM  . ferrous sulfate  325 mg Oral Q breakfast  . pantoprazole  40 mg Oral Daily  . saccharomyces boulardii  250 mg Oral BID    Studies/Results: No results found.  Assessment: s/p Procedure(s): ROBOTIC RIGHT COLECTOMY Patient Active Problem List   Diagnosis Date Noted  . Colon cancer, ascending ([place]) 04/12/2020    Expected post op course  Plan: Soft diet Ambulate D/c tom    LOS: 2 days     .Rosario Adie, MD [facility],  [place]    04/14/2020 8:49 AM

## 2020-04-15 MED ORDER — TRAMADOL HCL 50 MG PO TABS: 50.0000 mg | ORAL_TABLET | Freq: Four times a day (QID) | ORAL | 0 refills | Status: DC | PRN

## 2020-04-15 MED ORDER — ALPRAZOLAM 1 MG PO TABS: 1.0000 mg | ORAL_TABLET | Freq: Four times a day (QID) | ORAL | 0 refills | Status: DC

## 2020-04-15 MED FILL — traMADol HCL 50 MG TABS: 50 | 7 days supply | Qty: 30 | Fill #0

## 2020-04-15 NOTE — Discharge Summary (Signed)
Physician Discharge Summary  Patient ID: NICKOLAS CHALFIN MRN: [id number] DOB/AGE: November 29, 1965 54 y.o.  Admit date: 04/12/2020 Discharge date: 04/15/2020  Admission Diagnoses:  Discharge Diagnoses:  Active Problems:   Colon cancer, ascending [facility])   Discharged Condition: good  Hospital Course: Pt was admitted after surgery.  Diet was advanced as tolerated.  By POD 3 he was tolerating a diet, having bowel function and his pain was controlled with oral medications.    Consults: None  Significant Diagnostic Studies: labs: cbc, bmet  Treatments: IV hydration, analgesia: acetaminophen and surgery: robotic R colectomy  Discharge Exam: Blood pressure 119/74, pulse 80, temperature 98.5 F (36.9 C), temperature source Oral, resp. rate 18, height 5\' 6"  (1.676 m), weight 126.7 kg, SpO2 92 %. General appearance: alert and cooperative GI: normal findings: soft, non-tender Incision/Wound: clean, dry, intact  Disposition: Discharge disposition: 01-Home or Self Care        Allergies as of 04/15/2020      Reactions   Gabapentin Nausea Only   Prednisone Anxiety   "panic attack" Panic attacks      Medication List    TAKE these medications   ALPRAZolam 1 MG tablet Commonly known as: XANAX Take 1 tablet (1 mg total) by mouth 4 (four) times daily. Not recommended to take this medication more than 4 times a day What changed:   when to take this  additional instructions   amLODipine 5 MG tablet Commonly known as: NORVASC Take 5 mg by mouth daily.   atorvastatin 40 MG tablet Commonly known as: LIPITOR Take 40 mg by mouth daily.   carvedilol 6.25 MG tablet Commonly known as: COREG Take 6.25 mg by mouth 2 (two) times daily with a meal.   citalopram 40 MG tablet Commonly known as: CELEXA Take 40 mg by mouth in the morning and at bedtime.   omeprazole 20 MG capsule Commonly known as: PRILOSEC Take 20 mg by mouth daily.   ondansetron 4 MG tablet Commonly known as:  ZOFRAN Take 1 or 2 under the tongue Q 8hrs for nausea   phenazopyridine 200 MG tablet Commonly known as: PYRIDIUM Take 1 tablet (200 mg total) by mouth 3 (three) times daily as needed (pain on urination).   Slow Fe 142 (45 Fe) MG Tbcr Generic drug: Ferrous Sulfate Take 45 mg by mouth in the morning and at bedtime.   traMADol 50 MG tablet Commonly known as: ULTRAM Take 1 tablet (50 mg total) by mouth every 6 (six) hours as needed for moderate pain.       Follow-up Information    Leighton Ruff, MD. Schedule an appointment as soon as possible for a visit in 2 week(s).   Specialty: General Surgery Contact information: [place] [address] [phone]               Signed: Rosario Adie 2/45/8099, 8:36 AM

## 2020-04-15 NOTE — TOC Transition Note (Signed)
Transition of Care [facility]) - CM/SW Discharge Note   Patient Details  Name: Anthony Calhoun MRN: [id number] Date of Birth: [birth year]  [facility]) CM/SW Contact:  Leeroy Cha, RN Phone Number: 04/15/2020, 8:55 AM   Clinical Narrative:    Pt dc to home with self care, surgical incision clean and dry no issues. Orders checked for hhc -none No toc issues present.   Final next level of care: Home/Self Care Barriers to Discharge: No Barriers Identified   Patient Goals and CMS Choice Patient states their goals for this hospitalization and ongoing recovery are:: to go home CMS Medicare.gov Compare Post Acute Care list provided to:: Patient    Discharge Placement                       Discharge Plan and Services   Discharge Planning Services: CM Consult                                 Social Determinants of Health (SDOH) Interventions     Readmission Risk Interventions No flowsheet data found.

## 2020-04-15 NOTE — Plan of Care (Signed)
Pt was discharged home today. Instructions were reviewed with patient, and questions were answered. Pt was taken to main entrance via wheelchair by NT.  

## 2020-04-15 NOTE — Discharge Instructions (Signed)

## 2020-04-16 LAB — SURGICAL PATHOLOGY

## 2020-04-16 NOTE — Progress Notes (Signed)
Spoke with patient's fiance Anthony Calhoun regarding scheduling him for an appointment with Dr. Benay Spice here at [facility]  I offered Thursday 9/2 but she is concerned he may not be able to drive himself and she doesn't get off work until 3:00.  She agreed to take Monday 9/13 at 2 pm so he can drive himself.  They are very aware of our location as patient used to come with his uncle here for treatments.

## 2020-04-21 ENCOUNTER — Other Ambulatory Visit: Payer: Self-pay

## 2020-04-26 ENCOUNTER — Encounter (HOSPITAL_COMMUNITY): Payer: Self-pay

## 2020-05-10 ENCOUNTER — Encounter: Payer: Self-pay | Admitting: *Deleted

## 2020-05-10 ENCOUNTER — Inpatient Hospital Stay: Payer: 59

## 2020-05-10 ENCOUNTER — Other Ambulatory Visit: Payer: Self-pay

## 2020-05-10 ENCOUNTER — Inpatient Hospital Stay: Payer: 59 | Attending: Oncology | Admitting: Oncology

## 2020-05-10 VITALS — BP 113/76 | HR 74 | Temp 97.0°F | Resp 20 | Ht 66.0 in | Wt 272.3 lb

## 2020-05-10 DIAGNOSIS — F410 Panic disorder [episodic paroxysmal anxiety] without agoraphobia: Secondary | ICD-10-CM | POA: Diagnosis not present

## 2020-05-10 DIAGNOSIS — C182 Malignant neoplasm of ascending colon: Secondary | ICD-10-CM | POA: Diagnosis not present

## 2020-05-10 DIAGNOSIS — Z800 Family history of malignant neoplasm of digestive organs: Secondary | ICD-10-CM | POA: Insufficient documentation

## 2020-05-10 DIAGNOSIS — E785 Hyperlipidemia, unspecified: Secondary | ICD-10-CM | POA: Insufficient documentation

## 2020-05-10 DIAGNOSIS — F329 Major depressive disorder, single episode, unspecified: Secondary | ICD-10-CM | POA: Diagnosis not present

## 2020-05-10 DIAGNOSIS — D509 Iron deficiency anemia, unspecified: Secondary | ICD-10-CM | POA: Insufficient documentation

## 2020-05-10 DIAGNOSIS — I10 Essential (primary) hypertension: Secondary | ICD-10-CM | POA: Insufficient documentation

## 2020-05-10 LAB — CMP (CANCER CENTER ONLY)
ALT: 25 U/L (ref 0–44)
AST: 29 U/L (ref 15–41)
Albumin: 3.8 g/dL (ref 3.5–5.0)
Alkaline Phosphatase: 110 U/L (ref 38–126)
Anion gap: 8 (ref 5–15)
BUN: 8 mg/dL (ref 6–20)
CO2: 25 mmol/L (ref 22–32)
Calcium: 9.3 mg/dL (ref 8.9–10.3)
Chloride: 105 mmol/L (ref 98–111)
Creatinine: 0.86 mg/dL (ref 0.61–1.24)
GFR, Est AFR Am: >60 mL/min (ref >60)
GFR, Estimated: >60 mL/min (ref >60)
Glucose, Bld: 96 mg/dL (ref 70–99)
Potassium: 4.1 mmol/L (ref 3.5–5.1)
Sodium: 138 mmol/L (ref 135–145)
Total Bilirubin: 0.6 mg/dL (ref 0.3–1.2)
Total Protein: 7.5 g/dL (ref 6.5–8.1)

## 2020-05-10 LAB — CBC WITH DIFFERENTIAL (CANCER CENTER ONLY)
Abs Immature Granulocytes: 0.05 10*3/uL (ref 0.00–0.07)
Basophils Absolute: 0.1 10*3/uL (ref 0.0–0.1)
Basophils Relative: 1 %
Eosinophils Absolute: 0.1 10*3/uL (ref 0.0–0.5)
Eosinophils Relative: 1 %
HCT: 41.8 % (ref 39.0–52.0)
Hemoglobin: 13.1 g/dL (ref 13.0–17.0)
Immature Granulocytes: 1 %
Lymphocytes Relative: 29 %
Lymphs Abs: 2.5 10*3/uL (ref 0.7–4.0)
MCH: 25 pg — ABNORMAL LOW (ref 26.0–34.0)
MCHC: 31.3 g/dL (ref 30.0–36.0)
MCV: 79.6 fL — ABNORMAL LOW (ref 80.0–100.0)
Monocytes Absolute: 0.7 10*3/uL (ref 0.1–1.0)
Monocytes Relative: 8 %
Neutro Abs: 5.4 10*3/uL (ref 1.7–7.7)
Neutrophils Relative %: 60 %
Platelet Count: 373 10*3/uL (ref 150–400)
RBC: 5.25 MIL/uL (ref 4.22–5.81)
RDW: 16 % — ABNORMAL HIGH (ref 11.5–15.5)
WBC Count: 8.8 10*3/uL (ref 4.0–10.5)
nRBC: 0 % (ref 0.0–0.2)

## 2020-05-10 MED ORDER — CAPECITABINE 500 MG PO TABS: ORAL_TABLET | ORAL | 0 refills | Status: DC

## 2020-05-10 NOTE — Progress Notes (Signed)
Met with patient and his significant other Katharine Look today at medical oncology consult with Dr. Julieanne Manson.  They were given handouts on navigator role, [facility], oral chemotherapy pharmacy and Xeloda.  They verbalized an understanding of the plan to tentatively start Xeloda on Monday 05/17/2020 and we will get ordered blood work done today.  I provided them with my direct phone number should they have any questions or concerns.

## 2020-05-10 NOTE — Progress Notes (Signed)
[facility] New Patient Consult   Requesting MD: Antony Contras, Md [address] [place],  [place] [address]   Anthony Calhoun 54 y.o.  Jan 07, 1966    Reason for Consult: Colon cancer   HPI: Anthony Calhoun saw his primary provider with fatigue.  He reports being diagnosed with anemia and a Hemoccult positive stool.  He was referred to Dr. Penelope Coop for a colonoscopy on 02/12/2020.  Polyps were removed from the descending and transverse colon.  A mass was found in the cecum.  The mass was biopsied and the area was tattooed.  The pathology revealed tubular adenomas with focal high-grade dysplasia in the transverse colon.  The descending polyp returned as a sessile serrated adenoma.  The cecum mass biopsy revealed invasive moderately differentiated adenocarcinoma.  CTs of the chest, abdomen, and pelvis on 03/03/2020 revealed a 1 cm right paratracheal node, stable from 2019.  There is a 4.1 cm region of wall thickening at the ascending colon.  Small adjacent pericolic lymph nodes measured up to 0.4 cm.  No pathologic adenopathy.  He was referred to Dr. Marcello Moores and was taken to the operating room for a robotic assisted right colectomy on 04/12/2020.  A tattoo was noted in the mid ascending colon. The pathology ([id number]) revealed a moderately differentiated adenocarcinoma of the ascending colon.  Tumor invades the visceral peritoneum.  No macroscopic tumor perforation.  No lymphovascular or perineural invasion.  No tumor deposits.  0/19 lymph nodes contained metastatic carcinoma, the resection margins are negative.  There is an additional tubular adenoma of the mid ascending colon.  The tumor returned microsatellite stable with no loss of mismatch repair protein expression.  Anthony Calhoun is referred for oncology evaluation.  Past Medical History:  Diagnosis Date  . Anemia  August 2021  . Anxiety/panic attacks   . Arthritis    back,   . Cancer ([place]) stage IIb  (T4aN0)  04/12/2020   cecum  . Depression   . Dyspnea    started with anemia  . GERD (gastroesophageal reflux disease)   . History of kidney stones   . Hypertension   . Neuromuscular disorder ([facility])    carpal tunnel  . Pre-diabetes     Past Surgical History:  Procedure Laterality Date  . COLONOSCOPY    . CYSTOSCOPY W/ URETEROSCOPY W/ LITHOTRIPSY  1981    Medications: Reviewed  Allergies:  Allergies  Allergen Reactions  . Gabapentin Nausea Only  . Prednisone Anxiety    "panic attack" Panic attacks    Family history: An uncle had colon cancer.  His brother died of lung cancer and was a smoker.  His mother had leukemia.  Social History:   He lives in [place].  He works Theatre manager.  He does not use tobacco or alcohol.  No transfusion history.  No risk factor for HIV or hepatitis.  ROS:   Positives include: Malaise, intermittent nausea, more frequent panic attacks following the cancer diagnosis, "specks "of blood on toilet tissue following surgery  A complete ROS was otherwise negative.  Physical Exam:  Blood pressure 113/76, pulse 74, temperature (!) 97 F (36.1 C), temperature source Tympanic, resp. rate 20, height 5' 6"  (1.676 m), weight 272 lb 4.8 oz (123.5 kg), SpO2 98 %.  HEENT: Neck without mass Lungs: Clear bilaterally Cardiac: Regular rate and rhythm Abdomen: No hepatosplenomegaly, healed surgical incisions, no mass, nontender GU: Testes without mass Vascular: No leg edema Lymph nodes: No cervical, supraclavicular, axillary, or inguinal  nodes Neurologic: Alert and oriented, the motor exam appears intact in the upper and lower extremities bilaterally Skin: No rash    LAB:  CBC  Lab Results  Component Value Date   WBC 8.8 05/10/2020   HGB 13.1 05/10/2020   HCT 41.8 05/10/2020   MCV 79.6 (L) 05/10/2020   PLT 373 05/10/2020   NEUTROABS 5.4 05/10/2020        CMP  Lab Results  Component Value Date   NA 138 05/10/2020   K 4.1  05/10/2020   CL 105 05/10/2020   CO2 25 05/10/2020   GLUCOSE 96 05/10/2020   BUN 8 05/10/2020   CREATININE 0.86 05/10/2020   CALCIUM 9.3 05/10/2020   PROT 7.5 05/10/2020   ALBUMIN 3.8 05/10/2020   AST 29 05/10/2020   ALT 25 05/10/2020   ALKPHOS 110 05/10/2020   BILITOT 0.6 05/10/2020   GFRNONAA >60 05/10/2020   GFRAA >60 05/10/2020       Imaging: CT images from 03/02/2020-reviewed     Assessment/Plan:   1. Moderately differentiated adenocarcinoma of the ascending colon, stage IIb (T4aN0), status post a right colectomy 04/12/2020  Tumor invades the visceral peritoneum, 0/19 lymph nodes, no lymphovascular or perineural invasion, no tumor deposits, MSS, no loss of mismatch repair protein expression 2. Microcytic anemia secondary to #1 3. Colon polyps-sessile serrated adenoma of the descending colon, tubular adenomas with focal high-grade dysplasia of the transverse colon on colonoscopy 02/12/2020, tubular adenoma of the mid ascending colon on the surgical specimen 04/12/2020 4. Family history of colon cancer 5. Depression 6. Anxiety/panic attacks 7. Hypertension 8. Hyperlipidemia   Disposition:   Anthony Calhoun has been diagnosed with colon cancer.  I reviewed details of the surgical pathology report with him.  We discussed the prognosis and adjuvant treatment options.  He has a good prognosis for a long-term disease-free survival.  We discussed the limited absolute benefit from adjuvant chemotherapy in patients with resected stage II colon cancer.  However, chemotherapy is recommended for patients with "high risk "features.  The only high risk feature associated with his tumor is the T4a depth of invasion.  I recommend adjuvant chemotherapy in his case.  We discussed single agent 5-fluorouracil-based therapy and the expected survival benefit associated with this approach.  We also discussed the small expected absolute benefit from the addition of oxaliplatin.  We discussed  potential toxicities associated with 5-fluorouracil/capecitabine and oxaliplatin.  We discussed the probable need for Port-A-Cath placement to receive oxaliplatin.  Anthony Calhoun is most comfortable receiving single agent adjuvant capecitabine.  We reviewed potential toxicities associated with capecitabine including the chance for nausea, mucositis, diarrhea, alopecia, and hematologic toxicity.  We discussed the rash, sun sensitivity, hyperpigmentation, and hand/foot syndrome associated with capecitabine.  He does not appear to have hereditary nonpolyposis colon cancer syndrome, but his family members are at increased risk of developing colorectal cancer and should receive appropriate screening.  He will return to the lab for a baseline CBC, chemistry panel, and CEA today.  The plan is to begin adjuvant capecitabine on 05/17/2020.  We discussed diet and exercise maneuvers that may decrease the risk of developing colorectal cancer.  He should have a surveillance colonoscopy in 1 year. Betsy Coder, MD  05/10/2020, 5:03 PM

## 2020-05-10 NOTE — Progress Notes (Signed)
[facility] Psychosocial Distress Screening °Clinical Social Work ° °Clinical Social Work was referred by distress screening protocol.  The patient scored a 10 on the Psychosocial Distress Thermometer which indicates severe distress. Clinical Social Worker met with patient and patients significant other to assess for distress and other psychosocial needs.  Patient stated initially he was feeling overwhelmed and anxious due to lack of information on his treatment plan.  Patient stated he felt his anxiety/distress was down to a 7 after meeting with the treatment team.  CSW and patient discussed common feelings and emotions when beign diagnosed with cancer and the importance of support.  Patient identified his significant other as positive support.  Patient also has an established relationship with a psychiatrist in the community.  Patient stated he briefly tried counseling in the past, and was open to exploring options in the community and [facility].  CSW will research counselor/therapist options based on patients insurance in the community and share with patient.  CSW and patient also discussed the support team and support programs at [facility].  CSW provided patient with the support programs calendar and program information.  CSW provided contact information and encouraged patient to call with questions or concerns.      ° °ONCBCN DISTRESS SCREENING 05/10/2020  °Distress experienced in past week (1-10) 10  °Practical problem type Work/school  °Emotional problem type Depression;Nervousness/Anxiety;Adjusting to illness;Isolation/feeling alone;Feeling hopeless;Boredom  °Spiritual/Religous concerns type Facing my mortality  °Physical Problem type Nausea/vomiting;Sleep/insomnia  °Physician notified of physical symptoms Yes  °Referral to clinical psychology No  °Referral to clinical social work Yes  °Referral to dietition No  °Referral to financial advocate No  °Referral to support programs No  ° °Abigail Elmore, MSW, LCSW, OSW-C °Clinical  Social Worker °[facility] °[phone]      ° ° °

## 2020-05-11 ENCOUNTER — Telehealth: Payer: Self-pay | Admitting: Pharmacist

## 2020-05-11 ENCOUNTER — Telehealth: Payer: Self-pay

## 2020-05-11 DIAGNOSIS — C182 Malignant neoplasm of ascending colon: Secondary | ICD-10-CM

## 2020-05-11 LAB — CEA (IN HOUSE-CHCC): CEA (CHCC-In House): <1 ng/mL (ref 0.00–5.00)

## 2020-05-11 MED ORDER — CAPECITABINE 500 MG PO TABS: ORAL_TABLET | ORAL | 0 refills | Status: DC

## 2020-05-11 NOTE — Telephone Encounter (Addendum)
Oral Oncology Pharmacist Encounter  Received new prescription for Xeloda (capecitabine) for the adjuvant treatment of colon cancer, planned duration 6 months or until unacceptable drug toxicity.  Prescription dose and frequency assessed for appropriateness. Appropriate for therapy initiation.   CBC w/ Diff and CMP from 05/10/20 assessed, no relevant lab abnormalities noted that would prohibit treatment initiation.  Current medication list in Epic reviewed, DDIs with Xeloda identified:  Category C DDI between Xeloda and Omeprazole - proton pump inhibitors, such as omeprazole, can decrease efficacy of Xeloda. Will discuss alteratives such as famotidine with patient.   Evaluated chart and no patient barriers to medication adherence noted.   Patient's insurance requires that prescription be filled through [employer].   Oral Oncology Clinic will continue to follow for insurance authorization, copayment issues, initial counseling and start date.  Leron Croak, PharmD, BCPS Hematology/Oncology Clinical Pharmacist [facility] [phone] 05/11/2020 8:22 AM

## 2020-05-11 NOTE — Telephone Encounter (Signed)
Oral Oncology Patient Advocate Encounter  Prior Authorization for Xeloda has been approved.    PA# [id number] Effective dates: 05/11/20 through 05/11/21  Patient required to fill at [facility] will continue to follow.   [place] Patient [place] Phone [phone] Fax [phone] 05/11/2020 10:06 AM

## 2020-05-11 NOTE — Telephone Encounter (Signed)
Oral Oncology Patient Advocate Encounter  Received notification from [facility] that prior authorization for Xeloda is required.  PA submitted on CoverMyMeds Key [id number] Status is pending  Oral Oncology Clinic will continue to follow.   [place] Patient [place] Phone [phone] Fax [phone] 05/11/2020 8:46 AM

## 2020-05-13 ENCOUNTER — Telehealth: Payer: Self-pay | Admitting: General Practice

## 2020-05-13 ENCOUNTER — Telehealth: Payer: Self-pay | Admitting: *Deleted

## 2020-05-13 ENCOUNTER — Encounter: Payer: Self-pay | Admitting: Oncology

## 2020-05-13 MED ORDER — PROCHLORPERAZINE MALEATE 10 MG PO TABS: 10.0000 mg | ORAL_TABLET | Freq: Four times a day (QID) | ORAL | 1 refills | Status: DC | PRN

## 2020-05-13 NOTE — Telephone Encounter (Signed)
[place] CSW Progress Notes  Call from patient, states his patient copay for Xeloda is over $100 for two week supply.  States he cannot afford this.  Insurance company is requiring that med be filled at [facility], not [facility].  Referred to [place] for [employer], Renne Crigler for any options through pharmacy.  Arneta Cliche, nurse navigator who will discuss w oncologist.  Edwyna Shell, LCSW Clinical Social Worker Phone:  [phone]

## 2020-05-13 NOTE — Progress Notes (Signed)
Received referral from social worker regarding patient affording Xeloda and grant.  Anthony Calhoun working with patient to receive manufacturer assistance for Xeloda. She commented patient has received medications to get started and paid for.     Called patient to introduce myself as Arboriculturist and to discuss the [employer].  Patient emailed copy of income. Patient approved for one-time $1000 [employer] grant pending signed approval letter.Discussed expenses and how they are covered. Advised how he can submit some of the expense listed to recoup cost of medication paid. He verbalized understanding. Emailed a copy of the expense sheet and approval letter for him to sign. Patient will send back via email or bring tomorrow when he comes to sign paperwork for [facility] w/oral chemo.   He has my contact name and number to call when he arrives and for any additional financial questions or concerns.

## 2020-05-13 NOTE — Telephone Encounter (Signed)
Oral Chemotherapy Pharmacist Encounter  I spoke with patient for overview of: Xeloda (capecitabine) for the adjuvant treatment of colon cancer cancer, planned duration 6 months or until unacceptable drug toxicity.  Counseled patient on administration, dosing, side effects, monitoring, drug-food interactions, safe handling, storage, and disposal.  Patient will take Xeloda 500mg  tablets, 5 tablets (2500mg ) by mouth in AM and 4 tabs (2000mg ) by mouth in PM, within 30 minutes of finishing meals, for 14 days on, 7 days off, repeated every 21 days.  Xeloda start date: 05/17/20  Adverse effects include but are not limited to: fatigue, decreased blood counts, GI upset, diarrhea, mouth sores, and hand-foot syndrome.  Patient has anti-emetic on hand and knows to take it if nausea develops. Patient inquired about additional antiemetic and stated he has not had relief with Zofran. Will contact MD about patient concern.   Patient will obtain anti diarrheal and alert the office of 4 or more loose stools above baseline.  Reviewed with patient importance of keeping a medication schedule and plan for any missed doses. No barriers to medication adherence identified.  Medication reconciliation performed and medication/allergy list updated. Discussed drug-drug interaction between omeprazole and Xeloda. Patient stated he will plan to switch to using famotidine while he is on the Xeloda.   Medication is being filled through [facility]. Spoke with representative through their pharmacy who stated they would mark the Rx as STAT for delivery at the latest on 05/15/20. Patient provided with [facility]'s contact information.  All questions answered.  Anthony Calhoun voiced understanding and appreciation.   Medication education handout placed in mail for patient. Patient knows to call the office with questions or concerns. Oral Chemotherapy Clinic phone number provided to patient.   Leron Croak, PharmD, BCPS Hematology/Oncology Clinical Pharmacist [facility] [phone] 05/13/2020 1:52 PM

## 2020-05-13 NOTE — Telephone Encounter (Signed)
Called w/concern of being able to afford the chemotherapy--informed him he can submit his cost to cancer center to be re-embursed by the grant he qualifies for and pharmacy team is looking for drug company assistance for him. Encouraged him that his cancer is Stage II and can be cured. He requested a script for nausea, since he still has some baseline nausea.

## 2020-05-14 ENCOUNTER — Telehealth: Payer: Self-pay | Admitting: Pharmacist

## 2020-05-14 ENCOUNTER — Encounter: Payer: Self-pay | Admitting: Oncology

## 2020-05-14 ENCOUNTER — Telehealth: Payer: Self-pay | Admitting: *Deleted

## 2020-05-14 ENCOUNTER — Telehealth: Payer: Self-pay

## 2020-05-14 NOTE — Telephone Encounter (Signed)
Oral Chemotherapy Pharmacist Encounter   Spoke with patient today to follow up regarding patient's oral chemotherapy medication: Xeloda (capecitabine)  Discussed in detail again the potential DDI between omeprazole and Xeloda, specifically that omeprazole may decrease efficacy of Xeloda. Patient stated he is going to take famotidine while on therapy.  Additionally patient stated he was concerned about taking Compazine because "he read online it was used for schizophrenia". We discussed the mechanism of action of compazine as well as reviewed its efficacy for chemotherapy-induced nausea/vomiting. Patient understands that this medication will be used as needed for nausea/vomiting if this occurs while on Xeloda.  Patient knows to call the office with questions or concerns.  Leron Croak, PharmD, BCPS Hematology/Oncology Clinical Pharmacist [facility] [phone] 05/14/2020 3:32 PM

## 2020-05-14 NOTE — Telephone Encounter (Signed)
Left voice mail with concern about picking up script for compazine since he has read that it is used to treat schizophrenia. This was chosen due to patient report that zofran is not effective. He is also asking about continuing his omeprazole while on xeloda. Notified Leron Croak, oral oncology pharmacist and she will reach out to patient today.

## 2020-05-14 NOTE — Telephone Encounter (Signed)
Oral Oncology Patient Advocate Encounter ° °Met patient in lobby to complete application for [employer] in an effort to reduce patient's out of pocket expense for Xeloda to $0.   ° °Application completed and faxed to [phone].  ° °[employer] patient assistance phone number for follow up is [phone].  ° °This encounter will be updated until final determination.  ° ° °Elizabeth Tilley CPHT °Specialty Pharmacy Patient Advocate °[facility] °Phone [phone] °Fax [phone] °05/14/2020 2:09 PM ° °

## 2020-05-14 NOTE — Progress Notes (Signed)
Patient came to bring signed grant form for [employer].  He has a copy of the approval letter and expense sheet along with the Outpatient pharmacy information.  He has my card for any additional financial questions or concerns and was advised to call me with any needs.

## 2020-05-17 NOTE — Telephone Encounter (Signed)
Patient is approved for Xeloda at no cost through [facility] 05/17/20-until notified.  [place] uses Forensic scientist.  [facility] phone [phone]  Hazelle Woollard CPHT Specialty Pharmacy Patient [place] Phone [phone] Fax [phone] 05/17/2020 11:25 AM

## 2020-05-24 ENCOUNTER — Other Ambulatory Visit: Payer: Self-pay | Admitting: *Deleted

## 2020-05-24 ENCOUNTER — Telehealth: Payer: Self-pay | Admitting: *Deleted

## 2020-05-24 DIAGNOSIS — C182 Malignant neoplasm of ascending colon: Secondary | ICD-10-CM

## 2020-05-24 MED ORDER — CAPECITABINE 500 MG PO TABS: ORAL_TABLET | ORAL | 0 refills | Status: DC

## 2020-05-24 NOTE — Telephone Encounter (Signed)
Called to report he is still nauseated despite the compazine 10mg  (only taking it daily). Suggested he take the compazine 1 hour before the xeloda dose and supplement with zofran 8 mg in the middle of the day. Call back tomorrow if not better. Asking for confirmation that his cancer is an early stage and he can be cured? Confirmed he is stage IIb and, yes it can be cured. Reports he can't seem to stop thinking about his cancer and feels anxious.

## 2020-05-26 ENCOUNTER — Telehealth: Payer: Self-pay | Admitting: *Deleted

## 2020-05-26 NOTE — Telephone Encounter (Signed)
Called refill on Xeloda/capecitabine to automated line for [facility] [phone]

## 2020-06-03 ENCOUNTER — Other Ambulatory Visit: Payer: Self-pay

## 2020-06-03 ENCOUNTER — Inpatient Hospital Stay: Payer: 59

## 2020-06-03 ENCOUNTER — Inpatient Hospital Stay: Payer: 59 | Attending: Oncology | Admitting: Oncology

## 2020-06-03 VITALS — BP 116/77 | HR 73 | Temp 98.3°F | Resp 17 | Ht 66.0 in | Wt 273.9 lb

## 2020-06-03 DIAGNOSIS — Z23 Encounter for immunization: Secondary | ICD-10-CM | POA: Diagnosis not present

## 2020-06-03 DIAGNOSIS — Z800 Family history of malignant neoplasm of digestive organs: Secondary | ICD-10-CM | POA: Insufficient documentation

## 2020-06-03 DIAGNOSIS — D509 Iron deficiency anemia, unspecified: Secondary | ICD-10-CM | POA: Diagnosis not present

## 2020-06-03 DIAGNOSIS — F410 Panic disorder [episodic paroxysmal anxiety] without agoraphobia: Secondary | ICD-10-CM | POA: Insufficient documentation

## 2020-06-03 DIAGNOSIS — I10 Essential (primary) hypertension: Secondary | ICD-10-CM | POA: Insufficient documentation

## 2020-06-03 DIAGNOSIS — E785 Hyperlipidemia, unspecified: Secondary | ICD-10-CM | POA: Insufficient documentation

## 2020-06-03 DIAGNOSIS — Z79899 Other long term (current) drug therapy: Secondary | ICD-10-CM | POA: Diagnosis not present

## 2020-06-03 DIAGNOSIS — F329 Major depressive disorder, single episode, unspecified: Secondary | ICD-10-CM | POA: Diagnosis not present

## 2020-06-03 DIAGNOSIS — C182 Malignant neoplasm of ascending colon: Secondary | ICD-10-CM

## 2020-06-03 LAB — CBC WITH DIFFERENTIAL (CANCER CENTER ONLY)
Abs Immature Granulocytes: 0.03 10*3/uL (ref 0.00–0.07)
Basophils Absolute: 0 10*3/uL (ref 0.0–0.1)
Basophils Relative: 1 %
Eosinophils Absolute: 0.1 10*3/uL (ref 0.0–0.5)
Eosinophils Relative: 1 %
HCT: 40.5 % (ref 39.0–52.0)
Hemoglobin: 13 g/dL (ref 13.0–17.0)
Immature Granulocytes: 0 %
Lymphocytes Relative: 22 %
Lymphs Abs: 1.9 10*3/uL (ref 0.7–4.0)
MCH: 25.9 pg — ABNORMAL LOW (ref 26.0–34.0)
MCHC: 32.1 g/dL (ref 30.0–36.0)
MCV: 80.7 fL (ref 80.0–100.0)
Monocytes Absolute: 0.4 10*3/uL (ref 0.1–1.0)
Monocytes Relative: 5 %
Neutro Abs: 5.9 10*3/uL (ref 1.7–7.7)
Neutrophils Relative %: 71 %
Platelet Count: 422 10*3/uL — ABNORMAL HIGH (ref 150–400)
RBC: 5.02 MIL/uL (ref 4.22–5.81)
RDW: 19.1 % — ABNORMAL HIGH (ref 11.5–15.5)
WBC Count: 8.3 10*3/uL (ref 4.0–10.5)
nRBC: 0 % (ref 0.0–0.2)

## 2020-06-03 LAB — CMP (CANCER CENTER ONLY)
ALT: 30 U/L (ref 0–44)
AST: 31 U/L (ref 15–41)
Albumin: 3.5 g/dL (ref 3.5–5.0)
Alkaline Phosphatase: 100 U/L (ref 38–126)
Anion gap: 3 — ABNORMAL LOW (ref 5–15)
BUN: 7 mg/dL (ref 6–20)
CO2: 31 mmol/L (ref 22–32)
Calcium: 8.8 mg/dL — ABNORMAL LOW (ref 8.9–10.3)
Chloride: 103 mmol/L (ref 98–111)
Creatinine: 0.83 mg/dL (ref 0.61–1.24)
GFR, Estimated: >60 mL/min (ref >60)
Glucose, Bld: 148 mg/dL — ABNORMAL HIGH (ref 70–99)
Potassium: 3.7 mmol/L (ref 3.5–5.1)
Sodium: 137 mmol/L (ref 135–145)
Total Bilirubin: 0.6 mg/dL (ref 0.3–1.2)
Total Protein: 7.1 g/dL (ref 6.5–8.1)

## 2020-06-03 MED ORDER — PROMETHAZINE HCL 12.5 MG PO TABS: 12.5000 mg | ORAL_TABLET | Freq: Four times a day (QID) | ORAL | 2 refills | Status: DC | PRN

## 2020-06-03 MED ORDER — INFLUENZA VAC SPLIT QUAD 0.5 ML IM SUSY
PREFILLED_SYRINGE | INTRAMUSCULAR | Status: AC
  Filled 2020-06-03: qty 0.5

## 2020-06-03 MED ORDER — INFLUENZA VAC SPLIT QUAD 0.5 ML IM SUSY
0.5000 mL | PREFILLED_SYRINGE | Freq: Once | INTRAMUSCULAR | Status: AC
  Administered 2020-06-03: 0.5 mL via INTRAMUSCULAR

## 2020-06-03 NOTE — Progress Notes (Signed)
  [place] OFFICE PROGRESS NOTE   Diagnosis: Colon cancer  INTERVAL HISTORY:   [name] completed cycle 1 Xeloda beginning 05/17/2020.  He reports up to 3 episodes of diarrhea daily for a few days during the chemotherapy.  No hand or foot pain.  No mouth sores.  He had nausea while on Xeloda.  The nausea was not relieved with Zofran or Compazine.  The nausea has resolved.  Objective:  Vital signs in last 24 hours:  Blood pressure 116/77, pulse 73, temperature 98.3 F (36.8 C), temperature source Tympanic, resp. rate 17, height 5\' 6"  (1.676 m), weight 273 lb 14.4 oz (124.2 kg), SpO2 99 %.    HEENT: No thrush or ulcers Resp: Lungs clear bilaterally Cardio: Regular rate and rhythm GI: No hepatosplenomegaly Vascular: No leg edema  Skin: Mild dryness of the palms, skin thickening, dryness, and erythema of the soles.  No ulcers.    Lab Results:  Lab Results  Component Value Date   WBC 8.3 06/03/2020   HGB 13.0 06/03/2020   HCT 40.5 06/03/2020   MCV 80.7 06/03/2020   PLT 422 (H) 06/03/2020   NEUTROABS 5.9 06/03/2020    CMP  Lab Results  Component Value Date   NA 137 06/03/2020   K 3.7 06/03/2020   CL 103 06/03/2020   CO2 31 06/03/2020   GLUCOSE 148 (H) 06/03/2020   BUN 7 06/03/2020   CREATININE 0.83 06/03/2020   CALCIUM 8.8 (L) 06/03/2020   PROT 7.1 06/03/2020   ALBUMIN 3.5 06/03/2020   AST 31 06/03/2020   ALT 30 06/03/2020   ALKPHOS 100 06/03/2020   BILITOT 0.6 06/03/2020   GFRNONAA >60 06/03/2020   GFRAA >60 05/10/2020    Lab Results  Component Value Date   CEA1 <1.00 05/10/2020    Medications: I have reviewed the patient's current medications.   Assessment/Plan: 1. 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 Microcytic anemia secondary to #1-improved 3. Colon polyps-sessile serrated adenoma of the descending colon, tubular adenomas with focal high-grade dysplasia of the transverse colon on colonoscopy 02/12/2020, tubular adenoma of the mid ascending colon on the surgical specimen 04/12/2020 4. Family history of colon cancer 5. Depression 6. Anxiety/panic attacks 7. Hypertension 8. Hyperlipidemia 9. Nausea secondary to Xeloda    Disposition: [name] has completed 1 cycle of adjuvant Xeloda.  The Xeloda was complicated by nausea.  Compazine and Zofran did not help.  He will try Phenergan with the next cycle.  He'll contact us if the Phenergan does not relieve the nausea.  He will begin cycle 2 adjuvant Xeloda on 06/07/2020.  [name] will return for an office visit in 3 weeks.  He received an influenza vaccine today.  Betsy Coder, MD  06/03/2020  4:02 PM

## 2020-06-04 ENCOUNTER — Telehealth: Payer: Self-pay | Admitting: Oncology

## 2020-06-04 NOTE — Telephone Encounter (Signed)
Scheduled appointment per 10/7 los. Spoke to patient who is aware of appointment date and time.

## 2020-06-15 ENCOUNTER — Telehealth: Payer: Self-pay

## 2020-06-15 ENCOUNTER — Telehealth: Payer: Self-pay | Admitting: *Deleted

## 2020-06-15 MED ORDER — FAMOTIDINE 20 MG PO TABS: 20.0000 mg | ORAL_TABLET | Freq: Two times a day (BID) | ORAL | 2 refills | Status: DC

## 2020-06-15 MED ORDER — PROMETHAZINE HCL 50 MG PO TABS: 25.0000 mg | ORAL_TABLET | Freq: Four times a day (QID) | ORAL | 1 refills | Status: DC | PRN

## 2020-06-15 NOTE — Telephone Encounter (Signed)
Reports persistent nausea (no vomiting) even with promethazine 12.5 mg every 6 hours--it only lasts ~ 1 hour. Also heartburn despite Pepcid OTC 20 mg daily. Has occasional loose stools and says his anal area is irritated (not taking Imodium). Instructed him to try taking 1-2 Imodium/day to decrease # of stools, which will allow his anal are to heal. Says "I just feel yucky".

## 2020-06-15 NOTE — Telephone Encounter (Signed)
OK to take promethazine 25 mg every 6 hours as needed and to increase the pepcid to 20 mg bid. Will send script for both. Suggested he still try ondansetron 8 mg twice daily in between promethazine doses.

## 2020-06-15 NOTE — Telephone Encounter (Signed)
Patient calls states he has constant heartburn and nausea.  He is in his second week of his cycle of Xeloda.    He has been taking phenergan and pepcid and it eases things off for about an hour.  He is asking for suggestions on other management of these symptoms.  I told him I would check with Dr. Benay Spice and let him know.

## 2020-06-17 ENCOUNTER — Telehealth: Payer: Self-pay | Admitting: Emergency Medicine

## 2020-06-17 ENCOUNTER — Telehealth: Payer: Self-pay | Admitting: *Deleted

## 2020-06-17 NOTE — Telephone Encounter (Signed)
Reports seeing bright red blood in toilet after BM today. No clots or strings of blood in stool. Had small amount of blood on tissue when wiping yesterday. Denies any noted external hemorrhoids. Has ~ 3 stools day and they are soft. Does have some rectal burning after having BM. Denies it is a large amount of blood. Informed him most likely internal hemorrhoids w/some mucositis from xeloda. Call for large amounts of blood with clots. Otherwise, keep stools soft, use sitz bath couple times day and obtain OTC hemorrhoid preparation and use as directed. Baby wipes as well. He agrees to plan and will call for increased bleeding. Reassured him this is not uncommon for patient on xeloda chemotherapy.

## 2020-06-17 NOTE — Telephone Encounter (Signed)
Returning VM from pt who is reporting bright red blood in his bowel movement this morning.  Denies N/V or abdominal pain, SOB, or weakness.  Pt taking Xeloda.  Spoke with NP Lattie Haw who is reviewing pt's chart.  RN Lattie Haw or RN Manuela Schwartz to call pt back with final advisement.

## 2020-06-18 ENCOUNTER — Telehealth: Payer: Self-pay | Admitting: *Deleted

## 2020-06-18 NOTE — Telephone Encounter (Signed)
Patient reported he had two more stools w/blood after speaking w/RN yesterday. One had slightly more and the 2nd one less. He has not taken his Xeloda since am 06/17/20. He did use the sitz bath, but has not yet purchased the hemorrhoid cream. After discussion w/Lisa, RN: Instructed him to hold Xeloda till seen by Dr. Benay Spice on 06/21/20--moved the 10/29 visit to 10/25. Also suggested he call [facility] (Dr. Penelope Coop) and get his input on how to manage the rectal bleeding.

## 2020-06-21 ENCOUNTER — Other Ambulatory Visit: Payer: Self-pay

## 2020-06-21 ENCOUNTER — Inpatient Hospital Stay: Payer: 59

## 2020-06-21 ENCOUNTER — Inpatient Hospital Stay (HOSPITAL_BASED_OUTPATIENT_CLINIC_OR_DEPARTMENT_OTHER): Payer: 59 | Admitting: Oncology

## 2020-06-21 VITALS — BP 119/82 | HR 83 | Temp 99.0°F | Resp 17 | Ht 66.0 in | Wt 271.7 lb

## 2020-06-21 DIAGNOSIS — C182 Malignant neoplasm of ascending colon: Secondary | ICD-10-CM

## 2020-06-21 LAB — CBC WITH DIFFERENTIAL (CANCER CENTER ONLY)
Abs Immature Granulocytes: 0.05 10*3/uL (ref 0.00–0.07)
Basophils Absolute: 0.1 10*3/uL (ref 0.0–0.1)
Basophils Relative: 1 %
Eosinophils Absolute: 0.2 10*3/uL (ref 0.0–0.5)
Eosinophils Relative: 2 %
HCT: 41.7 % (ref 39.0–52.0)
Hemoglobin: 13.5 g/dL (ref 13.0–17.0)
Immature Granulocytes: 1 %
Lymphocytes Relative: 30 %
Lymphs Abs: 2.6 10*3/uL (ref 0.7–4.0)
MCH: 26.7 pg (ref 26.0–34.0)
MCHC: 32.4 g/dL (ref 30.0–36.0)
MCV: 82.6 fL (ref 80.0–100.0)
Monocytes Absolute: 0.5 10*3/uL (ref 0.1–1.0)
Monocytes Relative: 6 %
Neutro Abs: 5.2 10*3/uL (ref 1.7–7.7)
Neutrophils Relative %: 60 %
Platelet Count: 280 10*3/uL (ref 150–400)
RBC: 5.05 MIL/uL (ref 4.22–5.81)
RDW: 20.5 % — ABNORMAL HIGH (ref 11.5–15.5)
WBC Count: 8.6 10*3/uL (ref 4.0–10.5)
nRBC: 0 % (ref 0.0–0.2)

## 2020-06-21 LAB — CMP (CANCER CENTER ONLY)
ALT: 24 U/L (ref 0–44)
AST: 27 U/L (ref 15–41)
Albumin: 3.5 g/dL (ref 3.5–5.0)
Alkaline Phosphatase: 97 U/L (ref 38–126)
Anion gap: 10 (ref 5–15)
BUN: 9 mg/dL (ref 6–20)
CO2: 26 mmol/L (ref 22–32)
Calcium: 9.2 mg/dL (ref 8.9–10.3)
Chloride: 103 mmol/L (ref 98–111)
Creatinine: 0.9 mg/dL (ref 0.61–1.24)
GFR, Estimated: >60 mL/min (ref >60)
Glucose, Bld: 217 mg/dL — ABNORMAL HIGH (ref 70–99)
Potassium: 3.7 mmol/L (ref 3.5–5.1)
Sodium: 139 mmol/L (ref 135–145)
Total Bilirubin: 0.6 mg/dL (ref 0.3–1.2)
Total Protein: 6.9 g/dL (ref 6.5–8.1)

## 2020-06-21 NOTE — Patient Instructions (Signed)
Bland Diet A bland diet consists of foods that are often soft and do not have a lot of fat, fiber, or extra seasonings. Foods without fat, fiber, or seasoning are easier for the body to digest. They are also less likely to irritate your mouth, throat, stomach, and other parts of your digestive system. A bland diet is sometimes called a BRAT diet. What is my plan? Your health care provider or food and nutrition specialist (dietitian) may recommend specific changes to your diet to prevent symptoms or to treat your symptoms. These changes may include:  Eating small meals often.  Cooking food until it is soft enough to chew easily.  Chewing your food well.  Drinking fluids slowly.  Not eating foods that are very spicy, sour, or fatty.  Not eating citrus fruits, such as oranges and grapefruit. What do I need to know about this diet?  Eat a variety of foods from the bland diet food list.  Do not follow a bland diet longer than needed.  Ask your health care provider whether you should take vitamins or supplements. What foods can I eat? Grains  Hot cereals, such as cream of wheat. Rice. Bread, crackers, or tortillas made from refined white flour. Vegetables Canned or cooked vegetables. Mashed or boiled potatoes. Fruits  Bananas. Applesauce. Other types of cooked or canned fruit with the skin and seeds removed, such as canned peaches or pears. Meats and other proteins  Scrambled eggs. Creamy peanut butter or other nut butters. Lean, well-cooked meats, such as chicken or fish. Tofu. Soups or broths. Dairy Low-fat dairy products, such as milk, cottage cheese, or yogurt. Beverages  Water. Herbal tea. Apple juice. Fats and oils Mild salad dressings. Canola or olive oil. Sweets and desserts Pudding. Custard. Fruit gelatin. Ice cream. The items listed above may not be a complete list of recommended foods and beverages. Contact a dietitian for more options. What foods are not  recommended? Grains Whole grain breads and cereals. Vegetables Raw vegetables. Fruits Raw fruits, especially citrus, berries, or dried fruits. Dairy Whole fat dairy foods. Beverages Caffeinated drinks. Alcohol. Seasonings and condiments Strongly flavored seasonings or condiments. Hot sauce. Salsa. Other foods Spicy foods. Fried foods. Sour foods, such as pickled or fermented foods. Foods with high sugar content. Foods high in fiber. The items listed above may not be a complete list of foods and beverages to avoid. Contact a dietitian for more information. Summary  A bland diet consists of foods that are often soft and do not have a lot of fat, fiber, or extra seasonings.  Foods without fat, fiber, or seasoning are easier for the body to digest.  Check with your health care provider to see how long you should follow this diet plan. It is not meant to be followed for long periods. Per pharmacy continue pepcid 20 mg twice daily and supplement w/Tums. Elevate head of bed at night and be sure to stay upright for 1 hour after eating. Avoid spicy foods and carbonated beverages. Be sure not to allow GI to start you on a PPI--can effect efficacy of chemotherapy.

## 2020-06-21 NOTE — Progress Notes (Signed)
[place] OFFICE PROGRESS NOTE   Diagnosis: Colon cancer  INTERVAL HISTORY:   Anthony Calhoun began cycle 2 adjuvant Xeloda on 06/07/2020.  No mouth sores.  He reports one episode of diarrhea every other day.  He has nausea and heartburn.  Nausea predated the use of Xeloda, but has worsened while on Xeloda.  Zofran and Phenergan have not helped the nausea.  He has heartburn, partially relieved with Pepcid and over-the-counter antacids.  He did not take the last 2 days of Xeloda with this cycle. He had rectal bleeding last week.  This occurred after eating "cherry cheesecake ".  The bleeding has resolved.  He discontinue Xeloda when this occurred. Objective:  Vital signs in last 24 hours:  Blood pressure 119/82, pulse 83, temperature 99 F (37.2 C), temperature source Tympanic, resp. rate 17, height 5' 6"  (1.676 m), weight 271 lb 11.2 oz (123.2 kg), SpO2 96 %.    HEENT: No thrush or ulcers Resp: Lungs clear bilaterally Cardio: Regular rate and rhythm GI: No hepatosplenomegaly, nontender Vascular: No leg edema  Skin: Palms and soles without erythema.  Dryness and callus formation at the soles    Lab Results:  Lab Results  Component Value Date   WBC 8.6 06/21/2020   HGB 13.5 06/21/2020   HCT 41.7 06/21/2020   MCV 82.6 06/21/2020   PLT 280 06/21/2020   NEUTROABS 5.2 06/21/2020    CMP  Lab Results  Component Value Date   NA 139 06/21/2020   K 3.7 06/21/2020   CL 103 06/21/2020   CO2 26 06/21/2020   GLUCOSE 217 (H) 06/21/2020   BUN 9 06/21/2020   CREATININE 0.90 06/21/2020   CALCIUM 9.2 06/21/2020   PROT 6.9 06/21/2020   ALBUMIN 3.5 06/21/2020   AST 27 06/21/2020   ALT 24 06/21/2020   ALKPHOS 97 06/21/2020   BILITOT 0.6 06/21/2020   GFRNONAA >60 06/21/2020   GFRAA >60 05/10/2020     Medications: I have reviewed the patient's current medications.   Assessment/Plan: 1. Moderately differentiated adenocarcinoma of the ascending colon, stage IIb  (T4aN0), status post a right colectomy 04/12/2020  Tumor invades the visceral peritoneum, 0/19 lymph nodes, no lymphovascular or perineural invasion, no tumor deposits, MSS, no loss of mismatch repair protein expression  Cycle 1 adjuvant Xeloda 05/17/2020  Cycle 2 adjuvant Xeloda 06/07/2020, Xeloda discontinued after 12 days secondary to nausea and rectal bleeding  Cycle 3 adjuvant Xeloda 06/28/2020, dose reduced to 2000 mg a.m., 1500 mg p.m. secondary to nausea 2. Microcytic anemia secondary to #1-improved 3. Colon polyps-sessile serrated adenoma of the descending colon, tubular adenomas with focal high-grade dysplasia of the transverse colon on colonoscopy 02/12/2020, tubular adenoma of the mid ascending colon on the surgical specimen 04/12/2020 4. Family history of colon cancer 5. Depression 6. Anxiety/panic attacks 7. Hypertension 8. Hyperlipidemia 9. Nausea secondary to Xeloda? 10. Gastroesophageal reflux disease    Disposition: Anthony Calhoun has completed 2 cycles of adjuvant Xeloda.  He has tolerated the chemotherapy well other than nausea.  He reports he had nausea prior to beginning Xeloda, but the nausea is worse while on Xeloda.  Zofran and Phenergan have not helped.  We discussed a trial of lorazepam or Reglan.  He would like to continue the current antiemetics.  We will dose reduce Xeloda with the next cycle.  He will call for worsening of nausea or recurrent rectal bleeding.  We will consult with the Cancer center pharmacy regarding management of the reflux disease while  taking Xeloda.  Anthony Calhoun will return for an office and lab visit in 3 weeks.  Betsy Coder, MD  06/21/2020  4:05 PM

## 2020-06-22 ENCOUNTER — Other Ambulatory Visit: Payer: Self-pay | Admitting: *Deleted

## 2020-06-22 DIAGNOSIS — C182 Malignant neoplasm of ascending colon: Secondary | ICD-10-CM

## 2020-06-22 MED ORDER — CAPECITABINE 500 MG PO TABS: ORAL_TABLET | ORAL | 0 refills | Status: DC

## 2020-06-23 ENCOUNTER — Telehealth: Payer: Self-pay | Admitting: *Deleted

## 2020-06-23 DIAGNOSIS — C182 Malignant neoplasm of ascending colon: Secondary | ICD-10-CM

## 2020-06-23 MED ORDER — CAPECITABINE 500 MG PO TABS: ORAL_TABLET | ORAL | 0 refills | Status: DC

## 2020-06-23 NOTE — Telephone Encounter (Signed)
Confirmed w/[facility] they received the refill called in on 06/22/20. Patient needs to call to arrange delivery. Notified patient and provided phone #.

## 2020-06-25 ENCOUNTER — Ambulatory Visit: Payer: 59 | Admitting: Nurse Practitioner

## 2020-06-25 ENCOUNTER — Telehealth: Payer: Self-pay | Admitting: *Deleted

## 2020-06-25 ENCOUNTER — Telehealth: Payer: Self-pay | Admitting: General Practice

## 2020-06-25 ENCOUNTER — Other Ambulatory Visit: Payer: 59

## 2020-06-25 NOTE — Telephone Encounter (Signed)
Called after seeing a YouTube video of a person that had stage IV colon cancer and is dying. Worried about his cancer and recurrence. Reminded him that he is stage II cancer and Dr. Benay Spice feels the adjuvant chemotherapy of Xeloda will put him in a remission for a very long time. We will be vigilent and check labs and CT scan as necessary to follow up and he will have colonoscopy routinely as indicated. Everyone's cancer behaves differently and he can't compare his journey with someone else. Reminded him that worry over something that may or may not happen robs him of the joy of living now. He feels he needs to talk to someone, but he suggestions given by CSW did not accept his insurance or were cost prohibitive. Asking if there is any counseling available for him here? Will send message to CSW with his questions.

## 2020-06-25 NOTE — Telephone Encounter (Signed)
[place] CSW Progress Notes  Referral made to [facility] counseling intern - patient contacted by phone and agreeable to referral.  Edwyna Shell, [place] Worker Phone:  [phone]

## 2020-06-28 ENCOUNTER — Telehealth: Payer: Self-pay

## 2020-06-28 NOTE — Telephone Encounter (Signed)
Called to see if interested in counseling. Set up first session for Friday at 2 pm.   Gaylyn Rong Counseling Intern

## 2020-06-30 ENCOUNTER — Telehealth: Payer: Self-pay | Admitting: *Deleted

## 2020-06-30 NOTE — Telephone Encounter (Signed)
Reports he started most recent cycle of xeloda on 06/28/20. Asking if Dr. Benay Spice still feels he should F/U with GI about the rectal bleeding he had had a couple weeks ago, since it has stopped now? His heartburn is also much better now. His GI MD has retired, so he will need to establish with another MD in the group. He was considering asking for Dr. Paulita Fujita.

## 2020-07-02 ENCOUNTER — Telehealth: Payer: Self-pay

## 2020-07-02 NOTE — Telephone Encounter (Signed)
Informed patient that Dr. Benay Spice said he can hold off on the GI appointment for now.

## 2020-07-05 NOTE — Telephone Encounter (Signed)
Called for phone counseling session, but patient was not feeling well and asked to reschedule at a later date. Counselor will call back on Tuesday (11/9) to check in.  Gaylyn Rong Counseling Intern

## 2020-07-06 ENCOUNTER — Telehealth: Payer: Self-pay | Admitting: *Deleted

## 2020-07-06 ENCOUNTER — Telehealth: Payer: Self-pay

## 2020-07-06 NOTE — Telephone Encounter (Signed)
Called to report seeing "a lot" of blood in toilet when he had BM last night. No clots or blood mixed in stool-water was red. Informed him that a small amount of blood in toilet water can look like much more than it is. He called GI and made appointment to see Dr. Therisa Doyne tomorrow at 0800. He expressed concern that his cancer is back-this RN told him this is very unlikely. Started this recent cycle on 06/28/20.

## 2020-07-06 NOTE — Telephone Encounter (Signed)
Called to check in on patient and left message. Trying to reschedule an appointment.   Gaylyn Rong  Counseling Intern

## 2020-07-07 ENCOUNTER — Other Ambulatory Visit: Payer: Self-pay | Admitting: Oncology

## 2020-07-07 NOTE — Progress Notes (Signed)
Patient calls stating Dr. Therisa Doyne is going to do a repeat colonoscopy on Friday 11/12 to evaluate the rectal bleeding.  He is worried that everyone thinks he has more colon cancer.  I explained to him that this is how they evaluate rectal bleeding and that it could be coming from a number of sources other than cancer and that for his piece of mind he should proceed.  He verbalized an understanding and states he will go ahead with the procedure.

## 2020-07-15 ENCOUNTER — Other Ambulatory Visit: Payer: Self-pay

## 2020-07-15 ENCOUNTER — Telehealth: Payer: Self-pay | Admitting: *Deleted

## 2020-07-15 ENCOUNTER — Inpatient Hospital Stay: Payer: 59 | Attending: Oncology

## 2020-07-15 ENCOUNTER — Inpatient Hospital Stay (HOSPITAL_BASED_OUTPATIENT_CLINIC_OR_DEPARTMENT_OTHER): Payer: 59 | Admitting: Nurse Practitioner

## 2020-07-15 ENCOUNTER — Telehealth: Payer: Self-pay | Admitting: Nurse Practitioner

## 2020-07-15 ENCOUNTER — Encounter: Payer: Self-pay | Admitting: Nurse Practitioner

## 2020-07-15 ENCOUNTER — Telehealth: Payer: Self-pay

## 2020-07-15 VITALS — BP 117/62 | HR 85 | Temp 97.0°F | Resp 16 | Ht 66.0 in | Wt 270.7 lb

## 2020-07-15 DIAGNOSIS — C182 Malignant neoplasm of ascending colon: Secondary | ICD-10-CM | POA: Diagnosis present

## 2020-07-15 LAB — CMP (CANCER CENTER ONLY)
ALT: 26 U/L (ref 0–44)
AST: 28 U/L (ref 15–41)
Albumin: 3.5 g/dL (ref 3.5–5.0)
Alkaline Phosphatase: 110 U/L (ref 38–126)
Anion gap: 6 (ref 5–15)
BUN: 9 mg/dL (ref 6–20)
CO2: 28 mmol/L (ref 22–32)
Calcium: 8.6 mg/dL — ABNORMAL LOW (ref 8.9–10.3)
Chloride: 105 mmol/L (ref 98–111)
Creatinine: 0.89 mg/dL (ref 0.61–1.24)
GFR, Estimated: >60 mL/min (ref >60)
Glucose, Bld: 204 mg/dL — ABNORMAL HIGH (ref 70–99)
Potassium: 3.5 mmol/L (ref 3.5–5.1)
Sodium: 139 mmol/L (ref 135–145)
Total Bilirubin: 0.6 mg/dL (ref 0.3–1.2)
Total Protein: 7.3 g/dL (ref 6.5–8.1)

## 2020-07-15 LAB — CBC WITH DIFFERENTIAL (CANCER CENTER ONLY)
Abs Immature Granulocytes: 0.02 10*3/uL (ref 0.00–0.07)
Basophils Absolute: 0 10*3/uL (ref 0.0–0.1)
Basophils Relative: 1 %
Eosinophils Absolute: 0.1 10*3/uL (ref 0.0–0.5)
Eosinophils Relative: 2 %
HCT: 42.3 % (ref 39.0–52.0)
Hemoglobin: 13.9 g/dL (ref 13.0–17.0)
Immature Granulocytes: 0 %
Lymphocytes Relative: 30 %
Lymphs Abs: 2.2 10*3/uL (ref 0.7–4.0)
MCH: 27.9 pg (ref 26.0–34.0)
MCHC: 32.9 g/dL (ref 30.0–36.0)
MCV: 84.8 fL (ref 80.0–100.0)
Monocytes Absolute: 0.4 10*3/uL (ref 0.1–1.0)
Monocytes Relative: 5 %
Neutro Abs: 4.6 10*3/uL (ref 1.7–7.7)
Neutrophils Relative %: 62 %
Platelet Count: 329 10*3/uL (ref 150–400)
RBC: 4.99 MIL/uL (ref 4.22–5.81)
RDW: 20.2 % — ABNORMAL HIGH (ref 11.5–15.5)
WBC Count: 7.3 10*3/uL (ref 4.0–10.5)
nRBC: 0 % (ref 0.0–0.2)

## 2020-07-15 MED ORDER — CAPECITABINE 500 MG PO TABS: ORAL_TABLET | ORAL | 0 refills | Status: DC

## 2020-07-15 NOTE — Telephone Encounter (Signed)
Scheduled per los. Gave avs and calendar  

## 2020-07-15 NOTE — Telephone Encounter (Signed)
Called [facility] per instructions message left with request for pathology report from Colonoscopy performed 07/09/20 request made by L.Marcello Moores Np

## 2020-07-15 NOTE — Telephone Encounter (Signed)
Xeloda refill called to [facility] and patient notified of # [phone] to call and arrange for delivery.

## 2020-07-15 NOTE — Progress Notes (Signed)
  [place] OFFICE PROGRESS NOTE   Diagnosis: Colon cancer  INTERVAL HISTORY:   Anthony Calhoun returns as scheduled.  He completed cycle 3 adjuvant Xeloda beginning 06/28/2020. He had less nausea with cycle 3 periodic diarrhea, at x2 to 3/day. No mouth sores. No hand or foot pain or redness. No further rectal bleeding.  Objective:  Vital signs in last 24 hours:  Blood pressure 117/62, pulse 85, temperature (!) 97 F (36.1 C), temperature source Tympanic, resp. rate 16, height _0  (1.676 m), weight 270 lb 11.2 oz (122.8 kg), SpO2 97 %.    HEENT: No thrush or ulcers. Resp: Lungs clear bilaterally. Cardio: Regular rate and rhythm. GI: Abdomen soft and nontender. No hepatomegaly. Vascular: No leg edema.  Skin: Palms without erythema.   Lab Results:  Lab Results  Component Value Date   WBC 7.3 07/15/2020   HGB 13.9 07/15/2020   HCT 42.3 07/15/2020   MCV 84.8 07/15/2020   PLT 329 07/15/2020   NEUTROABS 4.6 07/15/2020    Imaging:  No results found.  Medications: I have reviewed the patient's current medications.  Assessment/Plan: 1. Moderately differentiated adenocarcinoma of the ascending colon, stage IIb (T4aN0), status post a right colectomy 04/12/2020 ? Tumor invades the visceral peritoneum, 0/19 lymph nodes, no lymphovascular or perineural invasion, no tumor deposits, MSS, no loss of mismatch repair protein expression ? Cycle 1 adjuvant Xeloda 05/17/2020 ? Cycle 2 adjuvant Xeloda 06/07/2020, Xeloda discontinued after 12 days secondary to nausea and rectal bleeding ? Cycle 3 adjuvant Xeloda 06/28/2020, dose reduced to 2000 mg a.m., 1500 mg p.m. secondary to nausea (patient discontinued Xeloda on day 11 due to colonoscopy) ? Colonoscopy 07/09/2020-nodular mucosa at the colonic anastomosis, biopsied; patent end-to-side ileocolonic anastomosis characterized by healthy-appearing mucosa and visible sutures; nonbleeding internal hemorrhoids ? Cycle 4 adjuvant  Xeloda 07/19/2020, 2000 mg every morning and 1500 mg every afternoon 2. Microcytic anemia secondary to #1-improved 3. Colon polyps-sessile serrated adenoma of the descending colon, tubular adenomas with focal high-grade dysplasia of the transverse colon on colonoscopy 02/12/2020, tubular adenoma of the mid ascending colon on the surgical specimen 04/12/2020 4. Family history of colon cancer 5. Depression 6. Anxiety/panic attacks 7. Hypertension 8. Hyperlipidemia 9. Nausea secondary to Xeloda? 10. Gastroesophageal reflux disease   Disposition: Anthony Calhoun appears stable. He has completed 3 cycles of adjuvant Xeloda. He noted improved tolerance with cycle 3. Plan to proceed with cycle 4 as scheduled beginning 07/19/2020.  We reviewed the CBC from today. Counts adequate to proceed with Xeloda as above.   The recent colonoscopy showed nodular mucosa at the colonic anastomosis. We will contact Dr. Encarnacion Slates office for the biopsy report.  He will return for lab and follow-up in 3 weeks. He will contact the office in the interim with any problems.    Ned Card ANP/GNP-BC   07/15/2020  3:24 PM

## 2020-07-16 ENCOUNTER — Telehealth: Payer: Self-pay

## 2020-07-16 NOTE — Telephone Encounter (Signed)
Anthony Calhoun from [facility]([phone]) returned my call this morning concerning request for pathology report from colonoscopy on 11-12/21. Pathology report will not be ready x10 days 11/24-11/29 once they receive it they will fax it to [place].

## 2020-07-29 ENCOUNTER — Other Ambulatory Visit: Payer: Self-pay | Admitting: *Deleted

## 2020-07-29 DIAGNOSIS — C182 Malignant neoplasm of ascending colon: Secondary | ICD-10-CM

## 2020-07-29 MED ORDER — CAPECITABINE 500 MG PO TABS: ORAL_TABLET | ORAL | 0 refills | Status: DC

## 2020-07-29 NOTE — Telephone Encounter (Signed)
Received faxed request from pharmacy for Xeloda 500 mg. Refill faxed w/confirmation received.

## 2020-08-05 ENCOUNTER — Inpatient Hospital Stay: Payer: 59 | Attending: Oncology | Admitting: Nurse Practitioner

## 2020-08-05 ENCOUNTER — Other Ambulatory Visit: Payer: Self-pay

## 2020-08-05 ENCOUNTER — Inpatient Hospital Stay: Payer: 59

## 2020-08-05 ENCOUNTER — Telehealth: Payer: Self-pay | Admitting: Nurse Practitioner

## 2020-08-05 ENCOUNTER — Encounter: Payer: Self-pay | Admitting: Nurse Practitioner

## 2020-08-05 VITALS — BP 124/67 | HR 85 | Temp 98.1°F | Resp 18 | Ht 66.0 in | Wt 274.0 lb

## 2020-08-05 DIAGNOSIS — K219 Gastro-esophageal reflux disease without esophagitis: Secondary | ICD-10-CM | POA: Insufficient documentation

## 2020-08-05 DIAGNOSIS — C182 Malignant neoplasm of ascending colon: Secondary | ICD-10-CM | POA: Diagnosis present

## 2020-08-05 DIAGNOSIS — F32A Depression, unspecified: Secondary | ICD-10-CM | POA: Diagnosis not present

## 2020-08-05 DIAGNOSIS — Z79899 Other long term (current) drug therapy: Secondary | ICD-10-CM | POA: Diagnosis not present

## 2020-08-05 DIAGNOSIS — E785 Hyperlipidemia, unspecified: Secondary | ICD-10-CM | POA: Diagnosis not present

## 2020-08-05 DIAGNOSIS — F410 Panic disorder [episodic paroxysmal anxiety] without agoraphobia: Secondary | ICD-10-CM | POA: Diagnosis not present

## 2020-08-05 DIAGNOSIS — I10 Essential (primary) hypertension: Secondary | ICD-10-CM | POA: Diagnosis not present

## 2020-08-05 DIAGNOSIS — D509 Iron deficiency anemia, unspecified: Secondary | ICD-10-CM | POA: Insufficient documentation

## 2020-08-05 LAB — CBC WITH DIFFERENTIAL (CANCER CENTER ONLY)
Abs Immature Granulocytes: 0.05 10*3/uL (ref 0.00–0.07)
Basophils Absolute: 0 10*3/uL (ref 0.0–0.1)
Basophils Relative: 1 %
Eosinophils Absolute: 0.1 10*3/uL (ref 0.0–0.5)
Eosinophils Relative: 2 %
HCT: 40.8 % (ref 39.0–52.0)
Hemoglobin: 13.5 g/dL (ref 13.0–17.0)
Immature Granulocytes: 1 %
Lymphocytes Relative: 30 %
Lymphs Abs: 2.5 10*3/uL (ref 0.7–4.0)
MCH: 28.8 pg (ref 26.0–34.0)
MCHC: 33.1 g/dL (ref 30.0–36.0)
MCV: 87 fL (ref 80.0–100.0)
Monocytes Absolute: 0.5 10*3/uL (ref 0.1–1.0)
Monocytes Relative: 5 %
Neutro Abs: 5.3 10*3/uL (ref 1.7–7.7)
Neutrophils Relative %: 61 %
Platelet Count: 305 10*3/uL (ref 150–400)
RBC: 4.69 MIL/uL (ref 4.22–5.81)
RDW: 20.1 % — ABNORMAL HIGH (ref 11.5–15.5)
WBC Count: 8.4 10*3/uL (ref 4.0–10.5)
nRBC: 0.2 % (ref 0.0–0.2)

## 2020-08-05 LAB — CMP (CANCER CENTER ONLY)
ALT: 25 U/L (ref 0–44)
AST: 29 U/L (ref 15–41)
Albumin: 3.4 g/dL — ABNORMAL LOW (ref 3.5–5.0)
Alkaline Phosphatase: 100 U/L (ref 38–126)
Anion gap: 10 (ref 5–15)
BUN: 10 mg/dL (ref 6–20)
CO2: 26 mmol/L (ref 22–32)
Calcium: 8.8 mg/dL — ABNORMAL LOW (ref 8.9–10.3)
Chloride: 103 mmol/L (ref 98–111)
Creatinine: 1 mg/dL (ref 0.61–1.24)
GFR, Estimated: >60 mL/min (ref >60)
Glucose, Bld: 264 mg/dL — ABNORMAL HIGH (ref 70–99)
Potassium: 3.6 mmol/L (ref 3.5–5.1)
Sodium: 139 mmol/L (ref 135–145)
Total Bilirubin: 0.7 mg/dL (ref 0.3–1.2)
Total Protein: 7.1 g/dL (ref 6.5–8.1)

## 2020-08-05 NOTE — Progress Notes (Signed)
  [facility] OFFICE PROGRESS NOTE   Diagnosis: Colon cancer  INTERVAL HISTORY:   Anthony Calhoun returns as scheduled.  Completed cycle 4 adjuvant Xeloda beginning 07/19/2020.  He continues to have nausea and "burning" abdominal discomfort.  He is taking Phenergan 25 mg prior to each dose of Xeloda with temporary minimal improvement.  He takes Prevacid and Pepcid.  No mouth sores.  No change in baseline loose stools.  No hand or foot pain or redness.  Objective:  Vital signs in last 24 hours:  Blood pressure 124/67, pulse 85, temperature 98.1 F (36.7 C), temperature source Tympanic, resp. rate 18, height 5' 6" (1.676 m), weight 274 lb (124.3 kg), SpO2 97 %.    HEENT: No thrush or ulcers. Resp: Lungs clear bilaterally. Cardio: Regular rate and rhythm. GI: Abdomen soft and nontender.  No hepatomegaly. Vascular: No leg edema. Skin: Palms without erythema.   Lab Results:  Lab Results  Component Value Date   WBC 8.4 08/05/2020   HGB 13.5 08/05/2020   HCT 40.8 08/05/2020   MCV 87.0 08/05/2020   PLT 305 08/05/2020   NEUTROABS 5.3 08/05/2020    Imaging:  No results found.  Medications: I have reviewed the patient's current medications.  Assessment/Plan: 1. Moderately differentiated adenocarcinoma of the ascending colon, stage IIb (T4aN0), status post a right colectomy 04/12/2020 ? Tumor invades the visceral peritoneum, 0/19 lymph nodes, no lymphovascular or perineural invasion, no tumor deposits, MSS, no loss of mismatch repair protein expression ? Cycle 1 adjuvant Xeloda 05/17/2020 ? Cycle 2 adjuvant Xeloda 06/07/2020, Xeloda discontinued after 12 days secondary to nausea and rectal bleeding ? Cycle 3 adjuvant Xeloda 06/28/2020, dose reduced to 2000 mg a.m., 1500 mg p.m. secondary to nausea (patient discontinued Xeloda on day 11 due to colonoscopy) ? Colonoscopy 07/09/2020-nodular mucosa at the colonic anastomosis, biopsied; patent end-to-side ileocolonic  anastomosis characterized by healthy-appearing mucosa and visible sutures; nonbleeding internal hemorrhoids ? Cycle 4 adjuvant Xeloda 07/19/2020, 2000 mg every morning and 1500 mg every afternoon 2. Microcytic anemia secondary to #1-improved 3. Colon polyps-sessile serrated adenoma of the descending colon, tubular adenomas with focal high-grade dysplasia of the transverse colon on colonoscopy 02/12/2020, tubular adenoma of the mid ascending colon on the surgical specimen 04/12/2020 4. Family history of colon cancer 5. Depression 6. Anxiety/panic attacks 7. Hypertension 8. Hyperlipidemia 9. Nausea secondary to Xeloda? 10. Gastroesophageal reflux disease   Disposition: Anthony Calhoun appears stable.  He has completed 4 cycles of adjuvant Xeloda.  Plan to proceed with cycle 5 as scheduled beginning 08/09/2020.  We reviewed the CBC and chemistry panel from today.  Labs adequate to proceed with Xeloda as above.    For the nausea/"burning" he will try Maalox or Mylanta 2 to 3 hours after taking Xeloda.  He will return for lab and follow-up in 3 weeks.  Plan reviewed with Dr. Benay Spice.    Ned Card ANP/GNP-BC   08/05/2020  3:39 PM

## 2020-08-05 NOTE — Telephone Encounter (Signed)
Scheduled appointments per 12/9 los. Spoke to patient who is aware of appointments dates and times.

## 2020-08-12 ENCOUNTER — Telehealth: Payer: Self-pay | Admitting: *Deleted

## 2020-08-12 NOTE — Telephone Encounter (Signed)
Called to report he has a pea sized raised red area near groin on right inner thigh. No pain or fever--is concerned. Informed him it most likely is a boil or infected hair follicle. Instructed him to use warm compress tid for ~ 20 minutes each and apply neosporin. Call if any redness begins to spread out from the area or he develops fever.

## 2020-08-17 ENCOUNTER — Other Ambulatory Visit: Payer: Self-pay | Admitting: *Deleted

## 2020-08-17 DIAGNOSIS — C182 Malignant neoplasm of ascending colon: Secondary | ICD-10-CM

## 2020-08-17 MED ORDER — CAPECITABINE 500 MG PO TABS: ORAL_TABLET | ORAL | 0 refills | Status: DC

## 2020-08-17 NOTE — Progress Notes (Signed)
Faxed Xeloda refill due to start 08/30/20 to [facility] pharmacy.

## 2020-08-18 ENCOUNTER — Other Ambulatory Visit: Payer: Self-pay | Admitting: *Deleted

## 2020-08-18 DIAGNOSIS — C182 Malignant neoplasm of ascending colon: Secondary | ICD-10-CM

## 2020-08-18 MED ORDER — CAPECITABINE 500 MG PO TABS: ORAL_TABLET | ORAL | 0 refills | Status: DC

## 2020-08-18 NOTE — Progress Notes (Signed)
Patient called to f/u on Xeloda script. Informed him it was faxed yesterday to [facility]. Contacted [facility] to cancel script sent there in error.

## 2020-08-24 ENCOUNTER — Telehealth: Payer: Self-pay

## 2020-08-24 NOTE — Telephone Encounter (Signed)
-----   Message from Rana Snare, NP sent at 08/24/2020  2:19 PM EST ----- Please contact [facility] gastroenterology for pathology report from colonoscopy in November.

## 2020-08-24 NOTE — Telephone Encounter (Signed)
Another call placed and completed for a copy of procedure from November from [facility] states it will be sent

## 2020-08-25 ENCOUNTER — Other Ambulatory Visit: Payer: Self-pay

## 2020-08-25 ENCOUNTER — Other Ambulatory Visit: Payer: Self-pay | Admitting: Oncology

## 2020-08-25 ENCOUNTER — Inpatient Hospital Stay: Payer: 59

## 2020-08-25 ENCOUNTER — Inpatient Hospital Stay (HOSPITAL_BASED_OUTPATIENT_CLINIC_OR_DEPARTMENT_OTHER): Payer: 59 | Admitting: Oncology

## 2020-08-25 ENCOUNTER — Telehealth: Payer: Self-pay | Admitting: Oncology

## 2020-08-25 VITALS — BP 133/80 | HR 80 | Temp 99.4°F | Resp 18 | Ht 66.0 in | Wt 268.9 lb

## 2020-08-25 DIAGNOSIS — C182 Malignant neoplasm of ascending colon: Secondary | ICD-10-CM

## 2020-08-25 DIAGNOSIS — Z23 Encounter for immunization: Secondary | ICD-10-CM

## 2020-08-25 LAB — CBC WITH DIFFERENTIAL (CANCER CENTER ONLY)
Abs Immature Granulocytes: 0.07 10*3/uL (ref 0.00–0.07)
Basophils Absolute: 0.1 10*3/uL (ref 0.0–0.1)
Basophils Relative: 1 %
Eosinophils Absolute: 0.1 10*3/uL (ref 0.0–0.5)
Eosinophils Relative: 2 %
HCT: 41.4 % (ref 39.0–52.0)
Hemoglobin: 13.8 g/dL (ref 13.0–17.0)
Immature Granulocytes: 1 %
Lymphocytes Relative: 31 %
Lymphs Abs: 2.6 10*3/uL (ref 0.7–4.0)
MCH: 29.9 pg (ref 26.0–34.0)
MCHC: 33.3 g/dL (ref 30.0–36.0)
MCV: 89.8 fL (ref 80.0–100.0)
Monocytes Absolute: 0.4 10*3/uL (ref 0.1–1.0)
Monocytes Relative: 5 %
Neutro Abs: 4.9 10*3/uL (ref 1.7–7.7)
Neutrophils Relative %: 60 %
Platelet Count: 297 10*3/uL (ref 150–400)
RBC: 4.61 MIL/uL (ref 4.22–5.81)
RDW: 19.7 % — ABNORMAL HIGH (ref 11.5–15.5)
WBC Count: 8.2 10*3/uL (ref 4.0–10.5)
nRBC: 0 % (ref 0.0–0.2)

## 2020-08-25 LAB — CMP (CANCER CENTER ONLY)
ALT: 25 U/L (ref 0–44)
AST: 32 U/L (ref 15–41)
Albumin: 3.6 g/dL (ref 3.5–5.0)
Alkaline Phosphatase: 98 U/L (ref 38–126)
Anion gap: 7 (ref 5–15)
BUN: 12 mg/dL (ref 6–20)
CO2: 29 mmol/L (ref 22–32)
Calcium: 8.9 mg/dL (ref 8.9–10.3)
Chloride: 103 mmol/L (ref 98–111)
Creatinine: 0.9 mg/dL (ref 0.61–1.24)
GFR, Estimated: >60 mL/min (ref >60)
Glucose, Bld: 217 mg/dL — ABNORMAL HIGH (ref 70–99)
Potassium: 3.9 mmol/L (ref 3.5–5.1)
Sodium: 139 mmol/L (ref 135–145)
Total Bilirubin: 0.9 mg/dL (ref 0.3–1.2)
Total Protein: 7.5 g/dL (ref 6.5–8.1)

## 2020-08-25 MED ORDER — CIMETIDINE 400 MG PO TABS: 400.0000 mg | ORAL_TABLET | Freq: Two times a day (BID) | ORAL | 3 refills | Status: DC

## 2020-08-25 NOTE — Progress Notes (Signed)
   Covid-19 Vaccination Clinic  Name:  Anthony Calhoun    MRN: [id number] DOB: 12-05-1965  08/25/2020  Anthony Calhoun was observed post Covid-19 immunization for 15 minutes without incident. He was provided with Vaccine Information Sheet and instruction to access the V-Safe system.   Anthony Calhoun was instructed to call 911 with any severe reactions post vaccine: Marland Kitchen Difficulty breathing  . Swelling of face and throat  . A fast heartbeat  . A bad rash all over body  . Dizziness and weakness   Immunizations Administered    Name Date Dose VIS Date Route   Pfizer COVID-19 Vaccine 08/25/2020 12:22 PM 0.3 mL 06/16/2020 Intramuscular   Manufacturer: [employer], [employer]   Lot: [id number]   NDC: [address]-9

## 2020-08-25 NOTE — Addendum Note (Signed)
Addended by: Wandalee Ferdinand on: 08/25/2020 12:32 PM   Modules accepted: Orders

## 2020-08-25 NOTE — Progress Notes (Signed)
°[facility] °OFFICE PROGRESS NOTE ° ° °Diagnosis: Colon cancer ° °INTERVAL HISTORY:  ° °Anthony Calhoun completed another cycle of Xeloda beginning 08/09/2020.  No mouth sores, diarrhea, or hand/foot pain.  He continues to have nausea and a "hunger "feeling.  Pepcid and Maalox have not helped.  No emesis. °He reports undergoing a colonoscopy with Dr. Karki this month. °Objective: ° °Vital signs in last 24 hours: ° °Blood pressure 133/80, pulse 80, temperature 99.4 °F (37.4 °C), temperature source Tympanic, resp. rate 18, height 5' 6" (1.676 m), weight 268 lb 14.4 oz (122 kg), SpO2 98 %. °  ° °HEENT: No thrush or ulcers °Resp: Lungs clear bilaterally °Cardio: Regular rate and rhythm °GI: No hepatosplenomegaly, no mass, mild tenderness in the mid upper abdomen °Vascular: No leg edema  °Skin: Palms and soles without erythema, skin thickening and callus formation with dryness of the feet ° ° °Lab Results: ° °Lab Results  °Component Value Date  ° WBC 8.2 08/25/2020  ° HGB 13.8 08/25/2020  ° HCT 41.4 08/25/2020  ° MCV 89.8 08/25/2020  ° PLT 297 08/25/2020  ° NEUTROABS 4.9 08/25/2020  ° ° °CMP  °Lab Results  °Component Value Date  ° NA 139 08/25/2020  ° K 3.9 08/25/2020  ° CL 103 08/25/2020  ° CO2 29 08/25/2020  ° GLUCOSE 217 (H) 08/25/2020  ° BUN 12 08/25/2020  ° CREATININE 0.90 08/25/2020  ° CALCIUM 8.9 08/25/2020  ° PROT 7.5 08/25/2020  ° ALBUMIN 3.6 08/25/2020  ° AST 32 08/25/2020  ° ALT 25 08/25/2020  ° ALKPHOS 98 08/25/2020  ° BILITOT 0.9 08/25/2020  ° GFRNONAA >60 08/25/2020  ° GFRAA >60 05/10/2020  ° ° °Lab Results  °Component Value Date  ° CEA1 <1.00 05/10/2020  ° ° °Medications: I have reviewed the patient's current medications. ° ° °Assessment/Plan: °1. Moderately differentiated adenocarcinoma of the ascending colon, stage IIb (T4aN0), status post a right colectomy 04/12/2020 °? Tumor invades the visceral peritoneum, 0/19 lymph nodes, no lymphovascular or perineural invasion, no tumor deposits,  MSS, no loss of mismatch repair protein expression °? Cycle 1 adjuvant Xeloda 05/17/2020 °? Cycle 2 adjuvant Xeloda 06/07/2020, Xeloda discontinued after 12 days secondary to nausea and rectal bleeding °? Cycle 3 adjuvant Xeloda 06/28/2020, dose reduced to 2000 mg a.m., 1500 mg p.m. secondary to nausea (patient discontinued Xeloda on day 11 due to colonoscopy) °? Colonoscopy 07/09/2020-nodular mucosa at the colonic anastomosis, biopsied; patent end-to-side ileocolonic anastomosis characterized by healthy-appearing mucosa and visible sutures; nonbleeding internal hemorrhoids °? Cycle 4 adjuvant Xeloda 07/19/2020, 2000 mg every morning and 1500 mg every afternoon °? Cycle 5 adjuvant Xeloda 08/09/2020 °? Cycle 6 adjuvant Xeloda 08/31/2019 °2. Microcytic anemia secondary to #1-improved °3. Colon polyps-sessile serrated adenoma of the descending colon, tubular adenomas with focal high-grade dysplasia of the transverse colon on colonoscopy 02/12/2020, tubular adenoma of the mid ascending colon on the surgical specimen 04/12/2020 °4. Family history of colon cancer °5. Depression °6. Anxiety/panic attacks °7. Hypertension °8. Hyperlipidemia °9. Nausea secondary to Xeloda? °10. Gastroesophageal reflux disease ° ° ° ° °Disposition: °Anthony Calhoun has completed 5 cycles of adjuvant Xeloda.  He has tolerated the Xeloda well other than nausea.  It is unclear whether the nausea is related to Xeloda or another etiology.  He will begin a trial of Tagamet in addition to Maalox. ° °He will begin the next cycle of Xeloda on 08/31/2019. ° °Anthony Calhoun received a COVID-19 booster vaccine today. ° °He will return for an office and   and lab visit on 09/17/2019.  Betsy Coder, MD  08/25/2020  11:55 AM

## 2020-08-25 NOTE — Telephone Encounter (Signed)
Scheduled appointments per 12/29 los. Called patient, no answer. Left message with appointment date and time.

## 2020-09-15 ENCOUNTER — Other Ambulatory Visit: Payer: Self-pay

## 2020-09-15 ENCOUNTER — Inpatient Hospital Stay (HOSPITAL_BASED_OUTPATIENT_CLINIC_OR_DEPARTMENT_OTHER): Payer: 59 | Admitting: Nurse Practitioner

## 2020-09-15 ENCOUNTER — Encounter: Payer: Self-pay | Admitting: Nurse Practitioner

## 2020-09-15 ENCOUNTER — Inpatient Hospital Stay: Payer: 59 | Attending: Oncology

## 2020-09-15 VITALS — BP 143/86 | HR 87 | Temp 97.6°F | Resp 18 | Ht 66.0 in | Wt 275.0 lb

## 2020-09-15 DIAGNOSIS — Z79899 Other long term (current) drug therapy: Secondary | ICD-10-CM | POA: Insufficient documentation

## 2020-09-15 DIAGNOSIS — F410 Panic disorder [episodic paroxysmal anxiety] without agoraphobia: Secondary | ICD-10-CM | POA: Insufficient documentation

## 2020-09-15 DIAGNOSIS — Z9049 Acquired absence of other specified parts of digestive tract: Secondary | ICD-10-CM | POA: Diagnosis not present

## 2020-09-15 DIAGNOSIS — D509 Iron deficiency anemia, unspecified: Secondary | ICD-10-CM | POA: Insufficient documentation

## 2020-09-15 DIAGNOSIS — K219 Gastro-esophageal reflux disease without esophagitis: Secondary | ICD-10-CM | POA: Insufficient documentation

## 2020-09-15 DIAGNOSIS — Z86010 Personal history of colonic polyps: Secondary | ICD-10-CM | POA: Insufficient documentation

## 2020-09-15 DIAGNOSIS — C182 Malignant neoplasm of ascending colon: Secondary | ICD-10-CM

## 2020-09-15 DIAGNOSIS — I10 Essential (primary) hypertension: Secondary | ICD-10-CM | POA: Diagnosis not present

## 2020-09-15 DIAGNOSIS — E785 Hyperlipidemia, unspecified: Secondary | ICD-10-CM | POA: Diagnosis not present

## 2020-09-15 DIAGNOSIS — Z800 Family history of malignant neoplasm of digestive organs: Secondary | ICD-10-CM | POA: Insufficient documentation

## 2020-09-15 DIAGNOSIS — F32A Depression, unspecified: Secondary | ICD-10-CM | POA: Diagnosis not present

## 2020-09-15 LAB — CBC WITH DIFFERENTIAL (CANCER CENTER ONLY)
Abs Immature Granulocytes: 0.06 10*3/uL (ref 0.00–0.07)
Basophils Absolute: 0.1 10*3/uL (ref 0.0–0.1)
Basophils Relative: 1 %
Eosinophils Absolute: 0.1 10*3/uL (ref 0.0–0.5)
Eosinophils Relative: 2 %
HCT: 42.6 % (ref 39.0–52.0)
Hemoglobin: 14.2 g/dL (ref 13.0–17.0)
Immature Granulocytes: 1 %
Lymphocytes Relative: 28 %
Lymphs Abs: 2.7 10*3/uL (ref 0.7–4.0)
MCH: 30.5 pg (ref 26.0–34.0)
MCHC: 33.3 g/dL (ref 30.0–36.0)
MCV: 91.6 fL (ref 80.0–100.0)
Monocytes Absolute: 0.6 10*3/uL (ref 0.1–1.0)
Monocytes Relative: 6 %
Neutro Abs: 5.9 10*3/uL (ref 1.7–7.7)
Neutrophils Relative %: 62 %
Platelet Count: 317 10*3/uL (ref 150–400)
RBC: 4.65 MIL/uL (ref 4.22–5.81)
RDW: 18.8 % — ABNORMAL HIGH (ref 11.5–15.5)
WBC Count: 9.4 10*3/uL (ref 4.0–10.5)
nRBC: 0 % (ref 0.0–0.2)

## 2020-09-15 LAB — CMP (CANCER CENTER ONLY)
ALT: 27 U/L (ref 0–44)
AST: 33 U/L (ref 15–41)
Albumin: 3.6 g/dL (ref 3.5–5.0)
Alkaline Phosphatase: 112 U/L (ref 38–126)
Anion gap: 10 (ref 5–15)
BUN: 11 mg/dL (ref 6–20)
CO2: 26 mmol/L (ref 22–32)
Calcium: 8.7 mg/dL — ABNORMAL LOW (ref 8.9–10.3)
Chloride: 101 mmol/L (ref 98–111)
Creatinine: 1.03 mg/dL (ref 0.61–1.24)
GFR, Estimated: >60 mL/min (ref >60)
Glucose, Bld: 255 mg/dL — ABNORMAL HIGH (ref 70–99)
Potassium: 3.8 mmol/L (ref 3.5–5.1)
Sodium: 137 mmol/L (ref 135–145)
Total Bilirubin: 0.8 mg/dL (ref 0.3–1.2)
Total Protein: 7.5 g/dL (ref 6.5–8.1)

## 2020-09-15 NOTE — Progress Notes (Signed)
xeloda refill

## 2020-09-15 NOTE — Progress Notes (Signed)
  [place] OFFICE PROGRESS NOTE   Diagnosis: Colon cancer  INTERVAL HISTORY:   Anthony Calhoun returns as scheduled.  He completed cycle 6 adjuvant Xeloda beginning 08/31/2019.  He had less nausea than with previous cycles.  He attributes this to a combination of Tagamet, Phenergan and Mylanta.  No significant heartburn.  He notes an alteration in taste.  No mouth sores.  He has occasional diarrhea.  No hand or foot pain or redness.  Objective:  Vital signs in last 24 hours:  Blood pressure (!) 143/86, pulse 87, temperature 97.6 F (36.4 C), temperature source Tympanic, resp. rate 18, height $RemoveBe'5\' 6"'USBsCBHiI$  (1.676 m), weight 275 lb (124.7 kg), SpO2 99 %.    HEENT: No thrush or ulcers. Resp: Lungs clear bilaterally. Cardio: Regular rate and rhythm. GI: Abdomen soft and nontender.  No hepatosplenomegaly.  No mass. Vascular: No leg edema.  Skin: Palms without erythema.   Lab Results:  Lab Results  Component Value Date   WBC 9.4 09/15/2020   HGB 14.2 09/15/2020   HCT 42.6 09/15/2020   MCV 91.6 09/15/2020   PLT 317 09/15/2020   NEUTROABS 5.9 09/15/2020    Imaging:  No results found.  Medications: I have reviewed the patient's current medications.  Assessment/Plan: 1. Moderately differentiated adenocarcinoma of the ascending colon, stage IIb (T4aN0), status post a right colectomy 04/12/2020 ? Tumor invades the visceral peritoneum, 0/19 lymph nodes, no lymphovascular or perineural invasion, no tumor deposits, MSS, no loss of mismatch repair protein expression ? Cycle 1 adjuvant Xeloda 05/17/2020 ? Cycle 2 adjuvant Xeloda 06/07/2020, Xeloda discontinued after 12 days secondary to nausea and rectal bleeding ? Cycle 3 adjuvant Xeloda 06/28/2020, dose reduced to 2000 mg a.m., 1500 mg p.m. secondary to nausea(patient discontinued Xeloda on day 11 due to colonoscopy) ? Colonoscopy 07/09/2020-nodular mucosa at the colonic anastomosis, biopsied; patent end-to-sideileocolonic  anastomosis characterized by healthy-appearing mucosa and visible sutures; nonbleeding internal hemorrhoids ? Cycle 4 adjuvant Xeloda 07/19/2020, 2000 mg every morning and 1500 mg every afternoon ? Cycle 5 adjuvant Xeloda 08/09/2020 ? Cycle 6 adjuvant Xeloda 08/31/2019 ? Cycle 7 adjuvant Xeloda 09/20/2020 2. Microcytic anemia secondary to #1-improved 3. Colon polyps-sessile serrated adenoma of the descending colon, tubular adenomas with focal high-grade dysplasia of the transverse colon on colonoscopy 02/12/2020, tubular adenoma of the mid ascending colon on the surgical specimen 04/12/2020 4. Family history of colon cancer 5. Depression 6. Anxiety/panic attacks 7. Hypertension 8. Hyperlipidemia 9. Nausea secondary to Xeloda? 10. Gastroesophageal reflux disease   Disposition: [name] appears stable.  He has completed six cycles of adjuvant Xeloda.  Plan to proceed with cycle seven as scheduled beginning 09/20/2020.  We reviewed the CBC and chemistry panel from today.  Labs adequate to continue as above.  He will return for lab and follow-up in 3 weeks.  We are available to see him sooner if needed.    Ned Card ANP/GNP-BC   09/15/2020  3:46 PM

## 2020-09-16 ENCOUNTER — Telehealth: Payer: Self-pay | Admitting: Oncology

## 2020-09-16 ENCOUNTER — Other Ambulatory Visit: Payer: Self-pay

## 2020-09-16 DIAGNOSIS — C182 Malignant neoplasm of ascending colon: Secondary | ICD-10-CM

## 2020-09-16 MED ORDER — CAPECITABINE 500 MG PO TABS: ORAL_TABLET | ORAL | 0 refills | Status: DC

## 2020-09-16 NOTE — Progress Notes (Signed)
xeloda refill to [facility]

## 2020-09-16 NOTE — Telephone Encounter (Signed)
Scheduled appointment per 11/9 los. Spoke to patient who is aware of appointment date and time.  °

## 2020-10-07 ENCOUNTER — Inpatient Hospital Stay: Payer: 59 | Attending: Oncology

## 2020-10-07 ENCOUNTER — Telehealth: Payer: Self-pay | Admitting: Oncology

## 2020-10-07 ENCOUNTER — Inpatient Hospital Stay (HOSPITAL_BASED_OUTPATIENT_CLINIC_OR_DEPARTMENT_OTHER): Payer: 59 | Admitting: Oncology

## 2020-10-07 ENCOUNTER — Other Ambulatory Visit: Payer: Self-pay

## 2020-10-07 VITALS — BP 114/80 | HR 75 | Temp 99.6°F | Resp 17 | Ht 66.0 in | Wt 275.4 lb

## 2020-10-07 DIAGNOSIS — F410 Panic disorder [episodic paroxysmal anxiety] without agoraphobia: Secondary | ICD-10-CM | POA: Insufficient documentation

## 2020-10-07 DIAGNOSIS — I10 Essential (primary) hypertension: Secondary | ICD-10-CM | POA: Insufficient documentation

## 2020-10-07 DIAGNOSIS — C182 Malignant neoplasm of ascending colon: Secondary | ICD-10-CM | POA: Diagnosis present

## 2020-10-07 DIAGNOSIS — Z79899 Other long term (current) drug therapy: Secondary | ICD-10-CM | POA: Insufficient documentation

## 2020-10-07 DIAGNOSIS — Z9221 Personal history of antineoplastic chemotherapy: Secondary | ICD-10-CM | POA: Diagnosis not present

## 2020-10-07 DIAGNOSIS — F32A Depression, unspecified: Secondary | ICD-10-CM | POA: Insufficient documentation

## 2020-10-07 DIAGNOSIS — K219 Gastro-esophageal reflux disease without esophagitis: Secondary | ICD-10-CM | POA: Diagnosis not present

## 2020-10-07 DIAGNOSIS — D509 Iron deficiency anemia, unspecified: Secondary | ICD-10-CM | POA: Insufficient documentation

## 2020-10-07 DIAGNOSIS — Z9049 Acquired absence of other specified parts of digestive tract: Secondary | ICD-10-CM | POA: Insufficient documentation

## 2020-10-07 DIAGNOSIS — E785 Hyperlipidemia, unspecified: Secondary | ICD-10-CM | POA: Insufficient documentation

## 2020-10-07 LAB — CBC WITH DIFFERENTIAL (CANCER CENTER ONLY)
Abs Immature Granulocytes: 0.04 10*3/uL (ref 0.00–0.07)
Basophils Absolute: 0.1 10*3/uL (ref 0.0–0.1)
Basophils Relative: 1 %
Eosinophils Absolute: 0.2 10*3/uL (ref 0.0–0.5)
Eosinophils Relative: 2 %
HCT: 40 % (ref 39.0–52.0)
Hemoglobin: 13.6 g/dL (ref 13.0–17.0)
Immature Granulocytes: 1 %
Lymphocytes Relative: 26 %
Lymphs Abs: 2.3 10*3/uL (ref 0.7–4.0)
MCH: 31.2 pg (ref 26.0–34.0)
MCHC: 34 g/dL (ref 30.0–36.0)
MCV: 91.7 fL (ref 80.0–100.0)
Monocytes Absolute: 0.7 10*3/uL (ref 0.1–1.0)
Monocytes Relative: 7 %
Neutro Abs: 5.6 10*3/uL (ref 1.7–7.7)
Neutrophils Relative %: 63 %
Platelet Count: 343 10*3/uL (ref 150–400)
RBC: 4.36 MIL/uL (ref 4.22–5.81)
RDW: 18.2 % — ABNORMAL HIGH (ref 11.5–15.5)
WBC Count: 8.8 10*3/uL (ref 4.0–10.5)
nRBC: 0 % (ref 0.0–0.2)

## 2020-10-07 LAB — CMP (CANCER CENTER ONLY)
ALT: 26 U/L (ref 0–44)
AST: 31 U/L (ref 15–41)
Albumin: 3.6 g/dL (ref 3.5–5.0)
Alkaline Phosphatase: 112 U/L (ref 38–126)
Anion gap: 9 (ref 5–15)
BUN: 14 mg/dL (ref 6–20)
CO2: 25 mmol/L (ref 22–32)
Calcium: 8.6 mg/dL — ABNORMAL LOW (ref 8.9–10.3)
Chloride: 102 mmol/L (ref 98–111)
Creatinine: 1.05 mg/dL (ref 0.61–1.24)
GFR, Estimated: >60 mL/min (ref >60)
Glucose, Bld: 120 mg/dL — ABNORMAL HIGH (ref 70–99)
Potassium: 3.8 mmol/L (ref 3.5–5.1)
Sodium: 136 mmol/L (ref 135–145)
Total Bilirubin: 0.8 mg/dL (ref 0.3–1.2)
Total Protein: 7.5 g/dL (ref 6.5–8.1)

## 2020-10-07 MED ORDER — PROMETHAZINE HCL 12.5 MG PO TABS: 12.5000 mg | ORAL_TABLET | Freq: Four times a day (QID) | ORAL | 0 refills | Status: DC | PRN

## 2020-10-07 NOTE — Addendum Note (Signed)
Addended by: Tania Ade on: 10/07/2020 04:46 PM   Modules accepted: Orders

## 2020-10-07 NOTE — Progress Notes (Signed)
[place] OFFICE PROGRESS NOTE   Diagnosis: Colon cancer  INTERVAL HISTORY:   Anthony Calhoun completed another cycle of Xeloda beginning 09/20/2020.  No mouth sores, diarrhea, or hand/foot pain.  He has an occasional diarrhea stool, once or twice a week.  He has skin thickening at the feet and tenderness at the fingertips.  He continues to have nausea and heartburn.  He is scheduled for an upper endoscopy by Dr. Therisa Doyne next week.  Phenergan helped the nausea but caused somnolence.  Objective:  Vital signs in last 24 hours:  Blood pressure 114/80, pulse 75, temperature 99.6 F (37.6 C), temperature source Tympanic, resp. rate 17, height 5' 6"  (1.676 m), weight 275 lb 6.4 oz (124.9 kg), SpO2 99 %.    HEENT: No thrush or ulcers Lymphatics: No cervical, supraclavicular, axillary, or inguinal nodes Resp: Lungs clear bilaterally Cardio: Regular rate and rhythm GI: No hepatosplenomegaly, no mass, nontender Vascular: No leg edema  Skin: Skin thickening, dryness, and diffuse raised yellow discolored skin over the feet.  Dryness and mild skin thickening over the hands    Lab Results:  Lab Results  Component Value Date   WBC 8.8 10/07/2020   HGB 13.6 10/07/2020   HCT 40.0 10/07/2020   MCV 91.7 10/07/2020   PLT 343 10/07/2020   NEUTROABS 5.6 10/07/2020    CMP  Lab Results  Component Value Date   NA 137 09/15/2020   K 3.8 09/15/2020   CL 101 09/15/2020   CO2 26 09/15/2020   GLUCOSE 255 (H) 09/15/2020   BUN 11 09/15/2020   CREATININE 1.03 09/15/2020   CALCIUM 8.7 (L) 09/15/2020   PROT 7.5 09/15/2020   ALBUMIN 3.6 09/15/2020   AST 33 09/15/2020   ALT 27 09/15/2020   ALKPHOS 112 09/15/2020   BILITOT 0.8 09/15/2020   GFRNONAA >60 09/15/2020   GFRAA >60 05/10/2020    Lab Results  Component Value Date   CEA1 <1.00 05/10/2020     Medications: I have reviewed the patient's current medications.   Assessment/Plan: 1. Moderately differentiated  adenocarcinoma of the ascending colon, stage IIb (T4aN0), status post a right colectomy 04/12/2020 ? Tumor invades the visceral peritoneum, 0/19 lymph nodes, no lymphovascular or perineural invasion, no tumor deposits, MSS, no loss of mismatch repair protein expression ? Cycle 1 adjuvant Xeloda 05/17/2020 ? Cycle 2 adjuvant Xeloda 06/07/2020, Xeloda discontinued after 12 days secondary to nausea and rectal bleeding ? Cycle 3 adjuvant Xeloda 06/28/2020, dose reduced to 2000 mg a.m., 1500 mg p.m. secondary to nausea(patient discontinued Xeloda on day 11 due to colonoscopy) ? Colonoscopy 07/09/2020-nodular mucosa at the colonic anastomosis, biopsied; patent end-to-sideileocolonic anastomosis characterized by healthy-appearing mucosa and visible sutures; nonbleeding internal hemorrhoids ? Cycle 4 adjuvant Xeloda 07/19/2020, 2000 mg every morning and 1500 mg every afternoon ? Cycle 5 adjuvant Xeloda 08/09/2020 ? Cycle 6 adjuvant Xeloda 08/31/2019 ? Cycle 7 adjuvant Xeloda 09/20/2020 ? Cycle 8 adjuvant Xeloda 10/11/2020 2. Microcytic anemia secondary to #1-improved 3. Colon polyps-sessile serrated adenoma of the descending colon, tubular adenomas with focal high-grade dysplasia of the transverse colon on colonoscopy 02/12/2020, tubular adenoma of the mid ascending colon on the surgical specimen 04/12/2020 4. Family history of colon cancer 5. Depression 6. Anxiety/panic attacks 7. Hypertension 8. Hyperlipidemia 9. Nausea secondary to Xeloda? 10. Gastroesophageal reflux disease-he will hold cimetidine and resume Prilosec on days he is not taking capecitabine.     Disposition: Anthony Calhoun appears unchanged.  He has completed 7 cycles of adjuvant Xeloda.  He has tolerated the Xeloda well other than persistent nausea and reflux symptoms.  He has been maintained off of proton pump inhibitors due to the interaction with Xeloda.  He will complete the final cycle of Xeloda beginning 10/11/2020.  I recommended  he resume Prilosec while off of capecitabine.  He will continue follow-up with Dr. Therisa Doyne for evaluation of reflux symptoms.  He will use Phenergan as needed for nausea.  We will dose reduce the Phenergan to 12.5 mg every 6 hours as needed.  Anthony Calhoun will return for an office visit in 1 month.  Betsy Coder, MD  10/07/2020  3:42 PM

## 2020-10-07 NOTE — Telephone Encounter (Signed)
Scheduled appointment per 2/10 los. Spoke to patient who is aware of appointment date and time. Gave patient calendar print out.

## 2020-10-08 ENCOUNTER — Inpatient Hospital Stay: Payer: 59 | Admitting: Oncology

## 2020-10-08 ENCOUNTER — Inpatient Hospital Stay: Payer: 59

## 2020-10-08 ENCOUNTER — Telehealth: Payer: Self-pay | Admitting: *Deleted

## 2020-10-08 LAB — CEA (IN HOUSE-CHCC): CEA (CHCC-In House): <1 ng/mL (ref 0.00–5.00)

## 2020-10-08 NOTE — Telephone Encounter (Signed)
Patient called for CEA results. He was notified.

## 2020-10-18 ENCOUNTER — Telehealth: Payer: Self-pay | Admitting: *Deleted

## 2020-10-18 DIAGNOSIS — C182 Malignant neoplasm of ascending colon: Secondary | ICD-10-CM

## 2020-10-18 MED ORDER — CAPECITABINE 500 MG PO TABS: ORAL_TABLET | ORAL | 0 refills | Status: DC

## 2020-10-18 NOTE — Telephone Encounter (Signed)
Dr. Benay Spice defers the Protonix decision to oncology pharmacist and would like him to finish out the complete cycle 8. Script for #35 pills = 5 days faxed to [facility]. Staff message sent to oral chemo pharmacist. Patient notified and he will call them tomorrow to arrange the delivery.

## 2020-10-18 NOTE — Telephone Encounter (Signed)
Patient reports that he had the upper endoscopy on 2/18 and saw some gastritis and a gastric polyp that was biopsied by Dr. Therisa Doyne. She insists that he needs to be on Protonix (he said she told him she looked it up and saw it was OK).  He also reports he did a miscount on his Xeloda pills. Last day of this cycle is 2/27, but he will be out of medication on 10/20/20.

## 2020-10-19 ENCOUNTER — Telehealth: Payer: Self-pay | Admitting: Pharmacist

## 2020-10-19 NOTE — Telephone Encounter (Signed)
Oral Chemotherapy Pharmacist Encounter   Spoke with patient today to follow up regarding patient's oral chemotherapy medication: Xeloda (capecitabine)  Reviewed in detail with patient drug-drug interaction potential between Protonix and Xeloda. Discussed with patient that agents like protonix increase the pH of the upper GI tract, which affects the absorption and solubility of Xeloda, resulting in potential for decreased efficacy.  Patient expressed appreciation and understanding, he stated since he is on his last week of Xeloda he will not take Protonix until he has completed this final cycle.   Patient knows to call the office with questions or concerns.  Leron Croak, PharmD, BCPS Hematology/Oncology Clinical Pharmacist [facility] [phone] 10/19/2020 10:04 AM

## 2020-10-25 ENCOUNTER — Telehealth: Payer: Self-pay | Admitting: *Deleted

## 2020-10-25 NOTE — Telephone Encounter (Signed)
Called to report he ran out of xeloda for today (last day). Asking what to do? Also wanted to confirm his next appointment. Informed him OK to just forget day 14 and provided date/time of next appointment. H will start the pantoprazole in a couple days.

## 2020-11-04 ENCOUNTER — Encounter: Payer: Self-pay | Admitting: Nurse Practitioner

## 2020-11-04 ENCOUNTER — Other Ambulatory Visit: Payer: Self-pay

## 2020-11-04 ENCOUNTER — Inpatient Hospital Stay: Payer: 59 | Attending: Oncology | Admitting: Nurse Practitioner

## 2020-11-04 VITALS — BP 129/73 | HR 78 | Temp 97.8°F | Resp 18 | Ht 66.0 in | Wt 270.6 lb

## 2020-11-04 DIAGNOSIS — Z23 Encounter for immunization: Secondary | ICD-10-CM | POA: Insufficient documentation

## 2020-11-04 DIAGNOSIS — D509 Iron deficiency anemia, unspecified: Secondary | ICD-10-CM | POA: Diagnosis not present

## 2020-11-04 DIAGNOSIS — C182 Malignant neoplasm of ascending colon: Secondary | ICD-10-CM | POA: Diagnosis not present

## 2020-11-04 NOTE — Progress Notes (Signed)
  Anthony Calhoun OFFICE PROGRESS NOTE   Diagnosis: Colon cancer  INTERVAL HISTORY:   Anthony Calhoun returns as scheduled.  He completed the eighth and final cycle of adjuvant Xeloda beginning 10/11/2020.  No significant nausea.  No diarrhea.  No hand or foot pain or redness.  He has resumed Protonix.  Heartburn is "much better".  Objective:  Vital signs in last 24 hours:  Blood pressure 129/73, pulse 78, temperature 97.8 F (36.6 C), temperature source Tympanic, resp. rate 18, height $RemoveBe'5\' 6"'[id number]$  (1.676 m), weight 270 lb 9.6 oz (122.7 kg), SpO2 98 %.    HEENT: No thrush or ulcers. Lymphatics: No palpable cervical, supraclavicular, axillary or inguinal lymph nodes. Resp: Lungs clear bilaterally. Cardio: Regular rate and rhythm. GI: No hepatomegaly. Vascular: No leg edema.  Skin: Multiple skin tags bilateral axilla.   Lab Results:  Lab Results  Component Value Date   WBC 8.8 10/07/2020   HGB 13.6 10/07/2020   HCT 40.0 10/07/2020   MCV 91.7 10/07/2020   PLT 343 10/07/2020   NEUTROABS 5.6 10/07/2020    Imaging:  No results found.  Medications: I have reviewed the patient's current medications.  Assessment/Plan: 1. Moderately differentiated adenocarcinoma of the ascending colon, stage IIb (T4aN0), status post a right colectomy 04/12/2020 ? Tumor invades the visceral peritoneum, 0/19 lymph nodes, no lymphovascular or perineural invasion, no tumor deposits, MSS, no loss of mismatch repair protein expression ? Cycle 1 adjuvant Xeloda 05/17/2020 ? Cycle 2 adjuvant Xeloda 06/07/2020, Xeloda discontinued after 12 days secondary to nausea and rectal bleeding ? Cycle 3 adjuvant Xeloda 06/28/2020, dose reduced to 2000 mg a.m., 1500 mg p.m. secondary to nausea(patient discontinued Xeloda on day 11 due to colonoscopy) ? Colonoscopy 07/09/2020-nodular mucosa at the colonic anastomosis, biopsied; patent end-to-sideileocolonic anastomosis characterized by healthy-appearing mucosa  and visible sutures; nonbleeding internal hemorrhoids ? Cycle 4 adjuvant Xeloda 07/19/2020, 2000 mg every morning and 1500 mg every afternoon ? Cycle 5 adjuvant Xeloda 08/09/2020 ? Cycle 6 adjuvant Xeloda 08/31/2019 ? Cycle 7 adjuvant Xeloda 09/20/2020 ? Cycle 8 adjuvant Xeloda 10/11/2020 2. Microcytic anemia secondary to #1-improved 3. Colon polyps-sessile serrated adenoma of the descending colon, tubular adenomas with focal high-grade dysplasia of the transverse colon on colonoscopy 02/12/2020, tubular adenoma of the mid ascending colon on the surgical specimen 04/12/2020 4. Family history of colon cancer 5. Depression 6. Anxiety/panic attacks 7. Hypertension 8. Hyperlipidemia 9. Nausea secondary to Xeloda? 10. Gastroesophageal reflux disease-he will hold cimetidine and resume Prilosec on days he is not taking capecitabine.    Disposition: Anthony Calhoun has completed the course of adjuvant Xeloda.  Plan for CEA and surveillance CT scans in approximately 4 months.  We will see him in follow-up a few days after the scans.  He is receiving the Covid booster today.    Ned Card ANP/GNP-BC   11/04/2020  3:23 PM

## 2020-11-05 ENCOUNTER — Telehealth: Payer: Self-pay | Admitting: Oncology

## 2020-11-05 NOTE — Telephone Encounter (Signed)
Scheduled appointments per 3/10 los. Spoke to patient who is aware of appointment dates and times.

## 2020-11-18 ENCOUNTER — Other Ambulatory Visit: Payer: Self-pay | Admitting: Oncology

## 2020-11-18 ENCOUNTER — Telehealth: Payer: Self-pay | Admitting: *Deleted

## 2020-11-18 NOTE — Telephone Encounter (Signed)
Received refill request for cimetidine 400 mg bid. Med list has him on pantoprzole 40 mg bid. Called patient to clarify what he is taking.  Patient reports he is now only on Prilosec 20 mg bid. Refill declined. He also has form for MD to complete regarding being out of work till ~ late April to allow him to gain his strength more. He will scan and send it by Mychart.

## 2020-11-19 ENCOUNTER — Other Ambulatory Visit (HOSPITAL_COMMUNITY): Payer: Self-pay

## 2020-11-26 ENCOUNTER — Telehealth: Payer: Self-pay

## 2020-11-26 NOTE — Telephone Encounter (Signed)
Patient calls stating he dropped off a form for his insurance company one day last week and was checking on it's status.  I checked with [facility] and we have no record of receiving the form.  I instructed him to try to get another one and he could drop it off at the [place] location next week.  He verbalized an understanding.

## 2020-11-30 ENCOUNTER — Encounter: Payer: Self-pay | Admitting: *Deleted

## 2021-02-08 ENCOUNTER — Telehealth: Payer: Self-pay | Admitting: *Deleted

## 2021-02-08 NOTE — Telephone Encounter (Signed)
Called patient w/CT appointment for 03/07/21 at 1:15/1:30 pm at [facility] location. Labs at 12:15. NPO 4 hours prior to scan and drink contrast at 1130 and 1230 that day. Reminded of OV on 7/13. He will pick up oral contrast a few days earlier. Notified managed care

## 2021-03-07 ENCOUNTER — Ambulatory Visit (HOSPITAL_BASED_OUTPATIENT_CLINIC_OR_DEPARTMENT_OTHER)
Admission: RE | Admit: 2021-03-07 | Discharge: 2021-03-07 | Disposition: A | Discharge status: Home / Self Care | Payer: 59 | Source: Ambulatory Visit | Attending: Nurse Practitioner | Admitting: Nurse Practitioner

## 2021-03-07 ENCOUNTER — Other Ambulatory Visit: Payer: Self-pay

## 2021-03-07 ENCOUNTER — Encounter (HOSPITAL_BASED_OUTPATIENT_CLINIC_OR_DEPARTMENT_OTHER): Payer: Self-pay

## 2021-03-07 ENCOUNTER — Inpatient Hospital Stay: Payer: 59 | Attending: Oncology

## 2021-03-07 ENCOUNTER — Ambulatory Visit (HOSPITAL_BASED_OUTPATIENT_CLINIC_OR_DEPARTMENT_OTHER): Payer: 59

## 2021-03-07 DIAGNOSIS — K219 Gastro-esophageal reflux disease without esophagitis: Secondary | ICD-10-CM | POA: Insufficient documentation

## 2021-03-07 DIAGNOSIS — C182 Malignant neoplasm of ascending colon: Secondary | ICD-10-CM | POA: Insufficient documentation

## 2021-03-07 DIAGNOSIS — Z79899 Other long term (current) drug therapy: Secondary | ICD-10-CM | POA: Insufficient documentation

## 2021-03-07 DIAGNOSIS — F410 Panic disorder [episodic paroxysmal anxiety] without agoraphobia: Secondary | ICD-10-CM | POA: Insufficient documentation

## 2021-03-07 DIAGNOSIS — F32A Depression, unspecified: Secondary | ICD-10-CM | POA: Insufficient documentation

## 2021-03-07 DIAGNOSIS — I10 Essential (primary) hypertension: Secondary | ICD-10-CM | POA: Insufficient documentation

## 2021-03-07 DIAGNOSIS — E785 Hyperlipidemia, unspecified: Secondary | ICD-10-CM | POA: Insufficient documentation

## 2021-03-07 DIAGNOSIS — D509 Iron deficiency anemia, unspecified: Secondary | ICD-10-CM | POA: Insufficient documentation

## 2021-03-07 DIAGNOSIS — Z86010 Personal history of colonic polyps: Secondary | ICD-10-CM | POA: Insufficient documentation

## 2021-03-07 LAB — CMP (CANCER CENTER ONLY)
ALT: 25 U/L (ref 0–44)
AST: 28 U/L (ref 15–41)
Albumin: 4.1 g/dL (ref 3.5–5.0)
Alkaline Phosphatase: 100 U/L (ref 38–126)
Anion gap: 9 (ref 5–15)
BUN: 12 mg/dL (ref 6–20)
CO2: 26 mmol/L (ref 22–32)
Calcium: 9 mg/dL (ref 8.9–10.3)
Chloride: 101 mmol/L (ref 98–111)
Creatinine: 0.86 mg/dL (ref 0.61–1.24)
GFR, Estimated: >60 mL/min (ref >60)
Glucose, Bld: 124 mg/dL — ABNORMAL HIGH (ref 70–99)
Potassium: 3.9 mmol/L (ref 3.5–5.1)
Sodium: 136 mmol/L (ref 135–145)
Total Bilirubin: 0.6 mg/dL (ref 0.3–1.2)
Total Protein: 7.2 g/dL (ref 6.5–8.1)

## 2021-03-07 LAB — CEA (ACCESS): CEA (CHCC): 1.45 ng/mL (ref 0.00–5.00)

## 2021-03-07 MED ORDER — IOHEXOL 300 MG/ML  SOLN
100.0000 mL | Freq: Once | INTRAMUSCULAR | Status: AC | PRN
  Administered 2021-03-07: 100 mL via INTRAVENOUS

## 2021-03-08 LAB — CEA (IN HOUSE-CHCC): CEA (CHCC-In House): 1.08 ng/mL (ref 0.00–5.00)

## 2021-03-09 ENCOUNTER — Inpatient Hospital Stay (HOSPITAL_BASED_OUTPATIENT_CLINIC_OR_DEPARTMENT_OTHER): Payer: 59 | Admitting: Oncology

## 2021-03-09 ENCOUNTER — Other Ambulatory Visit: Payer: Self-pay

## 2021-03-09 VITALS — BP 136/83 | HR 75 | Temp 97.6°F | Wt 270.0 lb

## 2021-03-09 DIAGNOSIS — D509 Iron deficiency anemia, unspecified: Secondary | ICD-10-CM | POA: Diagnosis not present

## 2021-03-09 DIAGNOSIS — F410 Panic disorder [episodic paroxysmal anxiety] without agoraphobia: Secondary | ICD-10-CM | POA: Diagnosis not present

## 2021-03-09 DIAGNOSIS — Z79899 Other long term (current) drug therapy: Secondary | ICD-10-CM | POA: Diagnosis not present

## 2021-03-09 DIAGNOSIS — C182 Malignant neoplasm of ascending colon: Secondary | ICD-10-CM | POA: Diagnosis present

## 2021-03-09 DIAGNOSIS — E785 Hyperlipidemia, unspecified: Secondary | ICD-10-CM | POA: Diagnosis not present

## 2021-03-09 DIAGNOSIS — Z86010 Personal history of colonic polyps: Secondary | ICD-10-CM | POA: Diagnosis not present

## 2021-03-09 DIAGNOSIS — K219 Gastro-esophageal reflux disease without esophagitis: Secondary | ICD-10-CM | POA: Diagnosis not present

## 2021-03-09 DIAGNOSIS — F32A Depression, unspecified: Secondary | ICD-10-CM | POA: Diagnosis not present

## 2021-03-09 DIAGNOSIS — I10 Essential (primary) hypertension: Secondary | ICD-10-CM | POA: Diagnosis not present

## 2021-03-09 NOTE — Progress Notes (Signed)
[facility] OFFICE PROGRESS NOTE   Diagnosis: Colon cancer  INTERVAL HISTORY:   Anthony Calhoun returns as scheduled.  He feels well.  The nausea has resolved.  No dysphagia.  He has noted intermittent pain in the right mid lateral abdomen and flank for the past few weeks.  The pain is not present today.  Good appetite.  Objective:  Vital signs in last 24 hours:  Blood pressure 136/83, pulse 75, temperature 97.6 F (36.4 C), temperature source Temporal, weight 270 lb (122.5 kg), SpO2 97 %.   Lymphatics: No cervical, supraclavicular, axillary, or inguinal nodes Resp: Lungs clear bilaterally Cardio: Regular rate and rhythm GI: No mass, nontender, no hepatosplenomegaly Vascular: No leg edema  Lab Results:  Lab Results  Component Value Date   WBC 8.8 10/07/2020   HGB 13.6 10/07/2020   HCT 40.0 10/07/2020   MCV 91.7 10/07/2020   PLT 343 10/07/2020   NEUTROABS 5.6 10/07/2020    CMP  Lab Results  Component Value Date   NA 136 03/07/2021   K 3.9 03/07/2021   CL 101 03/07/2021   CO2 26 03/07/2021   GLUCOSE 124 (H) 03/07/2021   BUN 12 03/07/2021   CREATININE 0.86 03/07/2021   CALCIUM 9.0 03/07/2021   PROT 7.2 03/07/2021   ALBUMIN 4.1 03/07/2021   AST 28 03/07/2021   ALT 25 03/07/2021   ALKPHOS 100 03/07/2021   BILITOT 0.6 03/07/2021   GFRNONAA >60 03/07/2021   GFRAA >60 05/10/2020    Lab Results  Component Value Date   CEA1 1.08 03/07/2021   CEA 1.45 03/07/2021     Imaging:  CT CHEST ABDOMEN PELVIS W CONTRAST  Result Date: 03/08/2021 CLINICAL DATA:  Gastrointestinal cancer, surveillance colon cancer. Kidney stones. EXAM: CT CHEST, ABDOMEN, AND PELVIS WITH CONTRAST TECHNIQUE: Multidetector CT imaging of the chest, abdomen and pelvis was performed following the standard protocol during bolus administration of intravenous contrast. CONTRAST:  100mL OMNIPAQUE IOHEXOL 300 MG/ML  SOLN COMPARISON:  03/02/2020. FINDINGS: CT CHEST FINDINGS  Cardiovascular: Coronary artery calcification. Heart is at the upper limits of normal in size. No pericardial effusion. Mediastinum/Nodes: Low-attenuation lesions in the left thyroid measure up to 1.2 cm. No follow-up recommended. (Ref: J Am Coll Radiol. 2015 Feb;12(2): 143-50).Mediastinal lymph nodes are not enlarged by CT size criteria. No hilar or axillary adenopathy. Esophagus is grossly unremarkable. Lungs/Pleura: Millimetric subpleural nodules are unchanged. Lungs are otherwise clear. No pleural fluid. Airway is unremarkable. Musculoskeletal: Degenerative changes in the spine. No worrisome lytic or sclerotic lesions. CT ABDOMEN PELVIS FINDINGS Hepatobiliary: Liver is decreased in attenuation diffusely and measures 20.4 cm. Liver and gallbladder are otherwise unremarkable. No biliary ductal dilatation. Pancreas: Negative. Spleen: Negative. Adrenals/Urinary Tract: Adrenal glands are unremarkable. Subcentimeter lesions in the right kidney are too small to characterize. Kidneys are otherwise unremarkable. Ureters are decompressed. Bladder is low in volume. Stomach/Bowel: Tiny hiatal hernia. Stomach is otherwise unremarkable. Slight duodenal wall thickening at the junction of the descending and horizontal portions. Small bowel is otherwise unremarkable. Right hemicolectomy. Colon is otherwise unremarkable. Vascular/Lymphatic: Atherosclerotic calcification of the aorta. No pathologically enlarged lymph nodes. Reproductive: Prostate is normal in size. Other: No free fluid.  Mesenteries and peritoneum are unremarkable. Musculoskeletal: Degenerative changes in the spine. No worrisome lytic or sclerotic lesions. IMPRESSION: 1. No evidence of metastatic disease. 2. Steatotic enlarged liver. 3. Slightly wall thickening at the junction of the descending and horizontal portions of the duodenum. Please correlate clinically. Recommend attention on follow-up. 4. Aortic atherosclerosis (ICD10-I70.0).   Coronary artery  calcification. Electronically Signed   By: Melinda  Blietz M.D.   On: 03/08/2021 11:06    Medications: I have reviewed the patient's current medications.   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 secondary to nausea (patient discontinued Xeloda on day 11 due to colonoscopy) Colonoscopy 07/09/2020-nodular mucosa at the colonic anastomosis, biopsied; patent end-to-side ileocolonic anastomosis characterized by healthy-appearing mucosa and visible sutures; nonbleeding internal hemorrhoids Cycle 4 adjuvant Xeloda 07/19/2020, 2000 mg every morning and 1500 mg every afternoon Cycle 5 adjuvant Xeloda 08/09/2020 Cycle 6 adjuvant Xeloda 08/31/2019 Cycle 7 adjuvant Xeloda 09/20/2020 Cycle 8 adjuvant Xeloda 10/11/2020 CTs 03/07/2021-no evidence of recurrent disease, hepatic steatosis, slight wall thickening at the junction of the descending and horizontal duodenum Microcytic anemia secondary to #1-improved Colon polyps-sessile serrated adenoma of the descending colon, tubular adenomas with focal high-grade dysplasia of the transverse colon on colonoscopy 02/12/2020, tubular adenoma of the mid ascending colon on the surgical specimen 04/12/2020 Family history of colon cancer Depression Anxiety/panic attacks Hypertension Hyperlipidemia Nausea secondary to Xeloda?-Resolved Gastroesophageal reflux disease-improved following discontinuation of Xeloda       Disposition: Anthony Calhoun is in clinical remission from colon  cancer.  He will return for an office visit and CEA in 6 months.  He will continue colonoscopy surveillance with Dr. Karki.  He will follow-up with Dr. Swain to discuss the cardiac calcifications noted on CT.  I suspect the right abdominal discomfort is a benign finding.  He will call for persistent pain.  The thickening of the duodenum is also likely a benign finding.  He does have symptoms to suggest a small bowel process at present.    Gary Sherrill, MD  03/09/2021  12:01 PM    

## 2021-05-16 ENCOUNTER — Other Ambulatory Visit: Payer: Self-pay | Admitting: Gastroenterology

## 2021-05-16 DIAGNOSIS — R109 Unspecified abdominal pain: Secondary | ICD-10-CM

## 2021-05-19 ENCOUNTER — Ambulatory Visit
Admission: RE | Admit: 2021-05-19 | Discharge: 2021-05-19 | Disposition: A | Discharge status: Home / Self Care | Payer: 59 | Source: Ambulatory Visit | Attending: Gastroenterology | Admitting: Gastroenterology

## 2021-05-19 DIAGNOSIS — R109 Unspecified abdominal pain: Secondary | ICD-10-CM

## 2021-06-01 ENCOUNTER — Other Ambulatory Visit: Payer: Self-pay | Admitting: Family Medicine

## 2021-06-01 DIAGNOSIS — M5416 Radiculopathy, lumbar region: Secondary | ICD-10-CM

## 2021-06-08 ENCOUNTER — Ambulatory Visit
Admission: RE | Admit: 2021-06-08 | Discharge: 2021-06-08 | Disposition: A | Discharge status: Home / Self Care | Payer: 59 | Source: Ambulatory Visit | Attending: Family Medicine | Admitting: Family Medicine

## 2021-06-08 DIAGNOSIS — M5416 Radiculopathy, lumbar region: Secondary | ICD-10-CM

## 2021-08-12 ENCOUNTER — Telehealth: Payer: Self-pay

## 2021-08-12 NOTE — Telephone Encounter (Signed)
Spoke with pt as requested to review what to expect at his upcoming appointment. Pt verbalizes understanding of appointment time and appreciative of call back.

## 2021-08-15 ENCOUNTER — Inpatient Hospital Stay: Payer: 59

## 2021-08-15 ENCOUNTER — Inpatient Hospital Stay: Payer: 59 | Attending: Oncology | Admitting: Oncology

## 2021-08-15 ENCOUNTER — Other Ambulatory Visit: Payer: Self-pay

## 2021-08-15 VITALS — BP 139/84 | HR 80 | Temp 98.2°F | Resp 18 | Ht 66.0 in | Wt 278.0 lb

## 2021-08-15 DIAGNOSIS — K760 Fatty (change of) liver, not elsewhere classified: Secondary | ICD-10-CM | POA: Diagnosis not present

## 2021-08-15 DIAGNOSIS — F410 Panic disorder [episodic paroxysmal anxiety] without agoraphobia: Secondary | ICD-10-CM | POA: Insufficient documentation

## 2021-08-15 DIAGNOSIS — K219 Gastro-esophageal reflux disease without esophagitis: Secondary | ICD-10-CM | POA: Insufficient documentation

## 2021-08-15 DIAGNOSIS — I10 Essential (primary) hypertension: Secondary | ICD-10-CM | POA: Diagnosis not present

## 2021-08-15 DIAGNOSIS — Z79899 Other long term (current) drug therapy: Secondary | ICD-10-CM | POA: Insufficient documentation

## 2021-08-15 DIAGNOSIS — F32A Depression, unspecified: Secondary | ICD-10-CM | POA: Diagnosis not present

## 2021-08-15 DIAGNOSIS — E785 Hyperlipidemia, unspecified: Secondary | ICD-10-CM | POA: Insufficient documentation

## 2021-08-15 DIAGNOSIS — D509 Iron deficiency anemia, unspecified: Secondary | ICD-10-CM | POA: Insufficient documentation

## 2021-08-15 DIAGNOSIS — Z9221 Personal history of antineoplastic chemotherapy: Secondary | ICD-10-CM | POA: Insufficient documentation

## 2021-08-15 DIAGNOSIS — C182 Malignant neoplasm of ascending colon: Secondary | ICD-10-CM | POA: Insufficient documentation

## 2021-08-15 LAB — CEA (ACCESS): CEA (CHCC): 4.72 ng/mL (ref 0.00–5.00)

## 2021-08-15 NOTE — Progress Notes (Signed)
[facility] OFFICE PROGRESS NOTE   Diagnosis: Colon cancer  INTERVAL HISTORY:   Anthony Calhoun returns as scheduled.  He feels well.  He is working.  He had an episode of rectal bleeding recently.  He called gastroenterology and is scheduled for colonoscopy in March.  Objective:  Vital signs in last 24 hours:  Blood pressure 139/84, pulse 80, temperature 98.2 F (36.8 C), temperature source Oral, resp. rate 18, height 5' 6"  (1.676 m), weight 278 lb (126.1 kg), SpO2 100 %.    Lymphatics: No cervical, supraclavicular, axillary, or inguinal nodes.  Prominent bilateral axillary fat pads Resp: Lungs clear bilaterally Cardio: Regular rate and rhythm GI: No hepatosplenomegaly, no mass, nontender Vascular: No leg edema   Lab Results:  Lab Results  Component Value Date   WBC 8.8 10/07/2020   HGB 13.6 10/07/2020   HCT 40.0 10/07/2020   MCV 91.7 10/07/2020   PLT 343 10/07/2020   NEUTROABS 5.6 10/07/2020    CMP  Lab Results  Component Value Date   NA 136 03/07/2021   K 3.9 03/07/2021   CL 101 03/07/2021   CO2 26 03/07/2021   GLUCOSE 124 (H) 03/07/2021   BUN 12 03/07/2021   CREATININE 0.86 03/07/2021   CALCIUM 9.0 03/07/2021   PROT 7.2 03/07/2021   ALBUMIN 4.1 03/07/2021   AST 28 03/07/2021   ALT 25 03/07/2021   ALKPHOS 100 03/07/2021   BILITOT 0.6 03/07/2021   GFRNONAA >60 03/07/2021   GFRAA >60 05/10/2020    Lab Results  Component Value Date   CEA1 1.08 03/07/2021   CEA 4.72 08/15/2021     Medications: I have reviewed the patient's current medications.   Assessment/Plan: Moderately differentiated adenocarcinoma of the ascending colon, stage IIb (T4aN0), status post a right colectomy 04/12/2020 Tumor invades the visceral peritoneum, 0/19 lymph nodes, no lymphovascular or perineural invasion, no tumor deposits, MSS, no loss of mismatch repair protein expression Cycle 1 adjuvant Xeloda 05/17/2020 Cycle 2 adjuvant Xeloda 06/07/2020, Xeloda  discontinued after 12 days secondary to nausea and rectal bleeding Cycle 3 adjuvant Xeloda 06/28/2020, dose reduced to 2000 mg a.m., 1500 mg p.m. secondary to nausea (patient discontinued Xeloda on day 11 due to colonoscopy) Colonoscopy 07/09/2020-nodular mucosa at the colonic anastomosis, biopsied; patent end-to-side ileocolonic anastomosis characterized by healthy-appearing mucosa and visible sutures; nonbleeding internal hemorrhoids, biopsy with benign ulcerated anastomotic mucosa with granulation tissue Cycle 4 adjuvant Xeloda 07/19/2020, 2000 mg every morning and 1500 mg every afternoon Cycle 5 adjuvant Xeloda 08/09/2020 Cycle 6 adjuvant Xeloda 08/31/2019 Cycle 7 adjuvant Xeloda 09/20/2020 Cycle 8 adjuvant Xeloda 10/11/2020 CTs 03/07/2021-no evidence of recurrent disease, hepatic steatosis, slight wall thickening at the junction of the descending and horizontal duodenum Microcytic anemia secondary to #1-improved Colon polyps-sessile serrated adenoma of the descending colon, tubular adenomas with focal high-grade dysplasia of the transverse colon on colonoscopy 02/12/2020, tubular adenoma of the mid ascending colon on the surgical specimen 04/12/2020 Family history of colon cancer Depression Anxiety/panic attacks Hypertension Hyperlipidemia Nausea secondary to Xeloda?-Resolved Gastroesophageal reflux disease-improved following discontinuation of Xeloda        Disposition: Anthony Calhoun is in clinical remission from colon cancer.  He will follow-up with Dr. Therisa Doyne for recurrent rectal bleeding.  The CEA is higher in the normal range today.  This is likely benign finding.  He will return for a CEA in 6 weeks.  He will be scheduled for an office visit and surveillance CTs in 6 months.  Betsy Coder, MD  08/15/2021  3:21 PM

## 2021-09-07 ENCOUNTER — Ambulatory Visit: Payer: Self-pay | Admitting: Urology

## 2021-09-14 ENCOUNTER — Other Ambulatory Visit: Payer: Self-pay

## 2021-09-14 ENCOUNTER — Encounter: Payer: Self-pay | Admitting: Urology

## 2021-09-14 ENCOUNTER — Ambulatory Visit (INDEPENDENT_AMBULATORY_CARE_PROVIDER_SITE_OTHER): Payer: 59 | Admitting: Urology

## 2021-09-14 VITALS — BP 138/83 | HR 78 | Wt 278.0 lb

## 2021-09-14 DIAGNOSIS — R972 Elevated prostate specific antigen [PSA]: Secondary | ICD-10-CM

## 2021-09-14 LAB — URINALYSIS, ROUTINE W REFLEX MICROSCOPIC
Bilirubin, UA: NEGATIVE
Glucose, UA: NEGATIVE
Ketones, UA: NEGATIVE
Leukocytes,UA: NEGATIVE
Nitrite, UA: NEGATIVE
Protein,UA: NEGATIVE
RBC, UA: NEGATIVE
Specific Gravity, UA: 1.01 (ref 1.005–1.030)
Urobilinogen, Ur: 0.2 mg/dL (ref 0.2–1.0)
pH, UA: 7 (ref 5.0–7.5)

## 2021-09-14 NOTE — Patient Instructions (Signed)

## 2021-09-14 NOTE — Progress Notes (Signed)
Urological Symptom Review  Patient is experiencing the following symptoms: Frequent urination Get up at night to urinate   Review of Systems  Gastrointestinal (upper)  : Nausea  Gastrointestinal (lower) : Negative for lower GI symptoms  Constitutional : Fatigue  Skin: Negative for skin symptoms  Eyes: Negative for eye symptoms  Ear/Nose/Throat : Negative for Ear/Nose/Throat symptoms  Hematologic/Lymphatic: Negative for Hematologic/Lymphatic symptoms  Cardiovascular : Negative for cardiovascular symptoms  Respiratory : Negative for respiratory symptoms  Endocrine: Negative for endocrine symptoms  Musculoskeletal: Negative for musculoskeletal symptoms  Neurological: Negative for neurological symptoms  Psychologic: Negative for psychiatric symptoms

## 2021-09-14 NOTE — Progress Notes (Signed)
09/14/2021 3:47 PM   Anthony Calhoun Jan 31, 1966 [id number]  Referring provider: Antony Contras, MD [place],  [place] [address]  Elevated PSA   HPI: Mr Mccaskey is a 56yo here for evaluation of elevated PSA. His PSa in 11/2019 was 1.49 and increased to 8.45 in 08/2021. He denies any significant LUTS. Nocturia 3x depending on fluid consumption. No family hx of prostate cancer. No other complaints today.    PMH: Past Medical History:  Diagnosis Date   Anemia    Anxiety    Arthritis    back,    Cancer ([place])    cecum   Depression    Dyspnea    started with anemia   GERD (gastroesophageal reflux disease)    History of kidney stones    Hypertension    Neuromuscular disorder (HCC)    carpal tunnel   Pre-diabetes     Surgical History: Past Surgical History:  Procedure Laterality Date   COLONOSCOPY     CYSTOSCOPY W/ URETEROSCOPY W/ LITHOTRIPSY  1981    Home Medications:  Allergies as of 09/14/2021       Reactions   Chlorthalidone Other (See Comments)   Cyclobenzaprine Other (See Comments)   Gabapentin Nausea Only   Losartan Potassium-hctz Other (See Comments)   Prednisone Anxiety   "panic attack" Panic attacks        Medication List        Accurate as of September 14, 2021  3:47 PM. If you have any questions, ask your nurse or doctor.          STOP taking these medications    amoxicillin-clavulanate 500-125 MG tablet Commonly known as: AUGMENTIN Stopped by: Nicolette Bang, MD       TAKE these medications    ALPRAZolam 1 MG tablet Commonly known as: XANAX Take 1 tablet (1 mg total) by mouth 4 (four) times daily. Not recommended to take this medication more than 4 times a day   amLODipine 5 MG tablet Commonly known as: NORVASC Take 5 mg by mouth daily.   atorvastatin 40 MG tablet Commonly known as: LIPITOR Take 40 mg by mouth daily.   carvedilol 6.25 MG tablet Commonly known as: COREG Take 6.25 mg by mouth  2 (two) times daily with a meal.   citalopram 40 MG tablet Commonly known as: CELEXA Take 40 mg by mouth in the morning and at bedtime.   omeprazole 20 MG capsule Commonly known as: PRILOSEC Take 20 mg by mouth in the morning and at bedtime.   promethazine 12.5 MG tablet Commonly known as: PHENERGAN Take 1 tablet (12.5 mg total) by mouth every 6 (six) hours as needed for nausea or vomiting.        Allergies:  Allergies  Allergen Reactions   Chlorthalidone Other (See Comments)   Cyclobenzaprine Other (See Comments)   Gabapentin Nausea Only   Losartan Potassium-Hctz Other (See Comments)   Prednisone Anxiety    "panic attack" Panic attacks    Family History: No family history on file.  Social History:  reports that he has never smoked. He has never used smokeless tobacco. He reports that he does not drink alcohol and does not use drugs.  ROS: All other review of systems were reviewed and are negative except what is noted above in HPI  Physical Exam: BP 138/83    Pulse 78    Wt 278 lb (126.1 kg)    BMI 44.87 kg/m   Constitutional:  Alert and oriented, No acute distress. HEENT: [place] AT, moist mucus membranes.  Trachea midline, no masses. Cardiovascular: No clubbing, cyanosis, or edema. Respiratory: Normal respiratory effort, no increased work of breathing. GI: Abdomen is soft, nontender, nondistended, no abdominal masses GU: No CVA tenderness. Circumcised phallus. No masses/lesions on penis, testis, scrotum. Prostate 40g smooth no nodules no induration.  Lymph: No cervical or inguinal lymphadenopathy. Skin: No rashes, bruises or suspicious lesions. Neurologic: Grossly intact, no focal deficits, moving all 4 extremities. Psychiatric: Normal mood and affect.  Laboratory Data: Lab Results  Component Value Date   WBC 8.8 10/07/2020   HGB 13.6 10/07/2020   HCT 40.0 10/07/2020   MCV 91.7 10/07/2020   PLT 343 10/07/2020    Lab Results  Component Value Date    CREATININE 0.86 03/07/2021    No results found for: PSA  No results found for: TESTOSTERONE  Lab Results  Component Value Date   HGBA1C 6.6 (H) 04/07/2020    Urinalysis    Component Value Date/Time   COLORURINE YELLOW 08/30/2017 1815   APPEARANCEUR CLEAR 08/30/2017 1815   LABSPEC 1.011 08/30/2017 1815   PHURINE 6.0 08/30/2017 1815   GLUCOSEU NEGATIVE 08/30/2017 1815   HGBUR LARGE (A) 08/30/2017 1815   BILIRUBINUR NEGATIVE 08/30/2017 1815   KETONESUR NEGATIVE 08/30/2017 1815   PROTEINUR NEGATIVE 08/30/2017 1815   NITRITE NEGATIVE 08/30/2017 1815   LEUKOCYTESUR NEGATIVE 08/30/2017 1815    Lab Results  Component Value Date   BACTERIA NONE SEEN 08/30/2017    Pertinent Imaging:  No results found for this or any previous visit.  No results found for this or any previous visit.  No results found for this or any previous visit.  No results found for this or any previous visit.  No results found for this or any previous visit.  No results found for this or any previous visit.  No results found for this or any previous visit.  No results found for this or any previous visit.   Assessment & Plan:    1. Elevated PSA The patient and I talked about etiologies of elevated PSA.  We discussed the possible relationship between elevated PSA and prostate cancer, BPH, prostatitis, infection trauma and recent ejaculations.  We will recheck PSA today and if it is persistently elevated we will proceed with prostate biopsy.   If his PSA in in the 2-3 range, I will see him back in 6 months with a repeat PSA.  - Urinalysis, Routine w reflex microscopic   No follow-ups on file.  Nicolette Bang, MD  [facility] [place]

## 2021-09-15 ENCOUNTER — Ambulatory Visit: Payer: Self-pay | Admitting: Urology

## 2021-09-15 LAB — PSA, TOTAL AND FREE
PSA, Free Pct: 11.6 %
PSA, Free: 0.29 ng/mL
Prostate Specific Ag, Serum: 2.5 ng/mL (ref 0.0–4.0)

## 2021-09-16 ENCOUNTER — Telehealth: Payer: Self-pay

## 2021-09-16 NOTE — Progress Notes (Signed)
PSA decreased to 2.5. I will see him back in 6 months with a PSA

## 2021-09-16 NOTE — Telephone Encounter (Signed)
Returned call- no answer. Left a message to return call.

## 2021-09-16 NOTE — Telephone Encounter (Signed)
Pt needing a call back on recent labs.  Please advise.  Call back: [phone]  Thanks, Helene Kelp

## 2021-09-16 NOTE — Telephone Encounter (Signed)
Patient notified of results. Appt created and mailed.

## 2021-09-16 NOTE — Telephone Encounter (Signed)
Patient returning missed call for test results.  Call back:  [phone]  Thanks, Helene Kelp

## 2021-09-26 ENCOUNTER — Encounter: Payer: Self-pay | Admitting: Oncology

## 2021-09-26 ENCOUNTER — Other Ambulatory Visit: Payer: Self-pay

## 2021-09-26 ENCOUNTER — Inpatient Hospital Stay: Payer: 59 | Attending: Oncology

## 2021-09-26 DIAGNOSIS — C182 Malignant neoplasm of ascending colon: Secondary | ICD-10-CM | POA: Diagnosis present

## 2021-09-26 LAB — CEA (ACCESS): CEA (CHCC): 5.42 ng/mL — ABNORMAL HIGH (ref 0.00–5.00)

## 2021-09-27 ENCOUNTER — Telehealth: Payer: Self-pay

## 2021-09-27 NOTE — Telephone Encounter (Signed)
Return call to Pt following up with Call to Access Nurse in reference to his lab results. Informed Pt Per Dr Benay Spice he will do another PSA in 1 month and if results are elevated he will send Pt for a CT Scan. Pt verbalized understanding.

## 2021-09-28 ENCOUNTER — Telehealth: Payer: Self-pay

## 2021-09-28 NOTE — Telephone Encounter (Signed)
-----   Message from Ladell Pier, MD sent at 09/27/2021  5:51 PM EST ----- Please call patient, CEA is mildly elevated-just above the normal range, repeat in 4-6 weeks, we will refer him for restaging CTs if the CEA is more elevated

## 2021-09-28 NOTE — Telephone Encounter (Signed)
Pt verbalized understanding.

## 2021-09-29 ENCOUNTER — Encounter: Payer: Self-pay | Admitting: Oncology

## 2021-10-03 ENCOUNTER — Other Ambulatory Visit: Payer: Self-pay | Admitting: *Deleted

## 2021-10-03 DIAGNOSIS — C182 Malignant neoplasm of ascending colon: Secondary | ICD-10-CM

## 2021-10-20 ENCOUNTER — Ambulatory Visit (INDEPENDENT_AMBULATORY_CARE_PROVIDER_SITE_OTHER): Payer: 59 | Admitting: Adult Health

## 2021-10-20 ENCOUNTER — Other Ambulatory Visit: Payer: Self-pay | Admitting: Adult Health

## 2021-10-20 ENCOUNTER — Encounter: Payer: Self-pay | Admitting: Adult Health

## 2021-10-20 ENCOUNTER — Other Ambulatory Visit: Payer: Self-pay

## 2021-10-20 VITALS — BP 134/84 | HR 80 | Ht 68.0 in | Wt 278.0 lb

## 2021-10-20 DIAGNOSIS — F411 Generalized anxiety disorder: Secondary | ICD-10-CM

## 2021-10-20 DIAGNOSIS — F331 Major depressive disorder, recurrent, moderate: Secondary | ICD-10-CM

## 2021-10-20 DIAGNOSIS — F429 Obsessive-compulsive disorder, unspecified: Secondary | ICD-10-CM

## 2021-10-20 DIAGNOSIS — F410 Panic disorder [episodic paroxysmal anxiety] without agoraphobia: Secondary | ICD-10-CM

## 2021-10-20 MED ORDER — SERTRALINE HCL 100 MG PO TABS: ORAL_TABLET | ORAL | 2 refills | Status: DC

## 2021-10-20 MED ORDER — ALPRAZOLAM 1 MG PO TABS: ORAL_TABLET | ORAL | 2 refills | Status: DC

## 2021-10-20 NOTE — Progress Notes (Signed)
[facility] MD/PA/NP Initial Note  10/21/2021 11:26 AM Anthony Calhoun  MRN:  [id number]  Chief Complaint:   HPI:  Anthony Calhoun is a 55 year old ale seen in the office today for an initial psychiatric evaluation.  Accompanied by fiance.  Previously seen by Dr Toy Care - will request records.`  Describes mood today as "not good". Pleasant. Denies tearfulness. Mood symptoms - reports depression and anxiety. Feels irritable at times. Increased worry and rumination. Not able to let things go. Feels like his current medication Celexa 40mg  BID is not effective. Has taken Celexa for several years and feels like it was helpful at one time. Since being diagnosed and treated for colon cancer his anxiety and worry have worsened. Stays on the "edge" - "waiting and wondering and researching things". Difficulties moving forward with irrational and intrusive thoughts of what will happen next". Apprehensive about a change, but is willing to try.  Is also taking Xanax 1mg  BID. Is willing to consider other options to help manage his OCD and anxiety. Stable interest and motivation. Taking medications as prescribed.  Energy levels lower, Active, does not have a regular exercise routine.  Enjoys some usual interests and activities. Engaged Lives with fiance - has an adult son and daughter - 2 grandchildren. Spending time with family. Appetite adequate. Weight stable - 276 pounds. Sleeps during the day and is alert at night thinking about things that need to be done. Averages hours. Focus and concentration stable. Completing tasks. Managing aspects of household. Out of work currently - applying for disability. Denies SI or HI.  Denies AH or VH.  Previous medication trials:  Celexa  Visit Diagnosis:    ICD-10-CM   1. Generalized anxiety disorder  F41.1 sertraline (ZOLOFT) 100 MG tablet    ALPRAZolam (XANAX) 1 MG tablet    2. Panic attacks  F41.0 sertraline (ZOLOFT) 100 MG tablet    ALPRAZolam (XANAX) 1 MG  tablet    3. Obsessive-compulsive disorder, unspecified type  F42.9 sertraline (ZOLOFT) 100 MG tablet    4. Major depressive disorder, recurrent episode, moderate (HCC)  F33.1 sertraline (ZOLOFT) 100 MG tablet      Past Psychiatric History: Denies psychiatric hospitalization.   Past Medical History:  Past Medical History:  Diagnosis Date   Anemia    Anxiety    Arthritis    back,    Cancer ([place])    cecum   Depression    Dyspnea    started with anemia   GERD (gastroesophageal reflux disease)    History of kidney stones    Hypertension    Neuromuscular disorder (HCC)    carpal tunnel   Pre-diabetes     Past Surgical History:  Procedure Laterality Date   COLONOSCOPY     CYSTOSCOPY W/ URETEROSCOPY W/ LITHOTRIPSY  1981    Family Psychiatric History: Denies any family history of mental illness.   Family History: No family history on file.  Social History:  Social History   Socioeconomic History   Marital status: Divorced    Spouse name: Not on file   Number of children: Not on file   Years of education: Not on file   Highest education level: Not on file  Occupational History   Not on file  Tobacco Use   Smoking status: Never   Smokeless tobacco: Never  Vaping Use   Vaping Use: Never used  Substance and Sexual Activity   Alcohol use: No   Drug use: No   Sexual activity: Not  on file  Other Topics Concern   Not on file  Social History Narrative   Not on file   Social Determinants of Health   Financial Resource Strain: Not on file  Food Insecurity: Not on file  Transportation Needs: Not on file  Physical Activity: Not on file  Stress: Not on file  Social Connections: Not on file    Allergies:  Allergies  Allergen Reactions   Chlorthalidone Other (See Comments)   Cyclobenzaprine Other (See Comments)   Gabapentin Nausea Only   Losartan Potassium-Hctz Other (See Comments)   Prednisone Anxiety    "panic attack" Panic attacks    Metabolic Disorder  Labs: Lab Results  Component Value Date   HGBA1C 6.6 (H) 04/07/2020   MPG 142.72 04/07/2020   No results found for: PROLACTIN No results found for: CHOL, TRIG, HDL, CHOLHDL, VLDL, LDLCALC No results found for: TSH  Therapeutic Level Labs: No results found for: LITHIUM No results found for: VALPROATE No components found for:  CBMZ  Current Medications: Current Outpatient Medications  Medication Sig Dispense Refill   sertraline (ZOLOFT) 100 MG tablet Take two tablets daily. 60 tablet 2   ALPRAZolam (XANAX) 1 MG tablet Take one tablet three times daily as needed for anxiety. 90 tablet 2   amLODipine (NORVASC) 5 MG tablet Take 5 mg by mouth daily.     atorvastatin (LIPITOR) 40 MG tablet Take 40 mg by mouth daily.     carvedilol (COREG) 6.25 MG tablet Take 6.25 mg by mouth 2 (two) times daily with a meal.     citalopram (CELEXA) 40 MG tablet Take 40 mg by mouth in the morning and at bedtime.      omeprazole (PRILOSEC) 20 MG capsule Take 20 mg by mouth in the morning and at bedtime.     promethazine (PHENERGAN) 12.5 MG tablet Take 1 tablet (12.5 mg total) by mouth every 6 (six) hours as needed for nausea or vomiting. 20 tablet 0   No current facility-administered medications for this visit.    Medication Side Effects: none  Orders placed this visit:  No orders of the defined types were placed in this encounter.   Psychiatric Specialty Exam:  Review of Systems  There were no vitals taken for this visit.There is no height or weight on file to calculate BMI.  General Appearance: Casual and Neat  Eye Contact:  Good  Speech:  Clear and Coherent and Normal Rate  Volume:  Normal  Mood:  Euthymic  Affect:  Appropriate and Non-Congruent  Thought Process:  Coherent and Descriptions of Associations: Intact  Orientation:  Full (Time, Place, and Person)  Thought Content: Logical   Suicidal Thoughts:  No  Homicidal Thoughts:  No  Memory:  WNL  Judgement:  Good  Insight:  Good   Psychomotor Activity:  Normal  Concentration:  Concentration: Good and Attention Span: Good  Recall:  Good  Fund of Knowledge: Good  Language: Good  Assets:  Communication Skills Desire for Improvement Financial Resources/Insurance Housing Intimacy Leisure Time Physical Health Resilience Social Support Talents/Skills Transportation Vocational/Educational  ADL's:  Intact  Cognition: WNL  Prognosis:  Good   Screenings: MDQ  Receiving Psychotherapy: No   Treatment Plan/Recommendations:  Plan:  PDMP reviewed  Xanax 1mg  BID Celexa 40mg  BID  RTC 4 weeks  Patient advised to contact office with any questions, adverse effects, or acute worsening in signs and symptoms.  Discussed potential benefits, risk, and side effects of benzodiazepines to include potential risk of tolerance  and dependence, as well as possible drowsiness.  Advised patient not to drive if experiencing drowsiness and to take lowest possible effective dose to minimize risk of dependence and tolerance.    Time spent with patient was 60 minutes. Greater than 50% of face to face time with patient was spent on counseling and coordination of care.       Aloha Gell, NP

## 2021-10-21 ENCOUNTER — Encounter: Payer: Self-pay | Admitting: Adult Health

## 2021-10-31 ENCOUNTER — Inpatient Hospital Stay: Payer: 59 | Attending: Oncology

## 2021-10-31 ENCOUNTER — Other Ambulatory Visit: Payer: Self-pay

## 2021-10-31 ENCOUNTER — Inpatient Hospital Stay (HOSPITAL_BASED_OUTPATIENT_CLINIC_OR_DEPARTMENT_OTHER): Payer: 59 | Admitting: Oncology

## 2021-10-31 ENCOUNTER — Telehealth: Payer: Self-pay | Admitting: Adult Health

## 2021-10-31 VITALS — BP 122/75 | HR 80 | Temp 97.8°F | Resp 18 | Ht 68.0 in | Wt 276.8 lb

## 2021-10-31 DIAGNOSIS — I10 Essential (primary) hypertension: Secondary | ICD-10-CM | POA: Diagnosis not present

## 2021-10-31 DIAGNOSIS — F410 Panic disorder [episodic paroxysmal anxiety] without agoraphobia: Secondary | ICD-10-CM | POA: Diagnosis not present

## 2021-10-31 DIAGNOSIS — E785 Hyperlipidemia, unspecified: Secondary | ICD-10-CM | POA: Diagnosis not present

## 2021-10-31 DIAGNOSIS — K219 Gastro-esophageal reflux disease without esophagitis: Secondary | ICD-10-CM | POA: Diagnosis not present

## 2021-10-31 DIAGNOSIS — D509 Iron deficiency anemia, unspecified: Secondary | ICD-10-CM | POA: Insufficient documentation

## 2021-10-31 DIAGNOSIS — Z79899 Other long term (current) drug therapy: Secondary | ICD-10-CM | POA: Diagnosis not present

## 2021-10-31 DIAGNOSIS — F32A Depression, unspecified: Secondary | ICD-10-CM | POA: Diagnosis not present

## 2021-10-31 DIAGNOSIS — C182 Malignant neoplasm of ascending colon: Secondary | ICD-10-CM | POA: Diagnosis present

## 2021-10-31 DIAGNOSIS — Z800 Family history of malignant neoplasm of digestive organs: Secondary | ICD-10-CM | POA: Insufficient documentation

## 2021-10-31 LAB — CEA (ACCESS): CEA (CHCC): 5.77 ng/mL — ABNORMAL HIGH (ref 0.00–5.00)

## 2021-10-31 MED ORDER — CITALOPRAM HYDROBROMIDE 20 MG PO TABS: 20.0000 mg | ORAL_TABLET | Freq: Every day | ORAL | 0 refills | Status: DC

## 2021-10-31 NOTE — Telephone Encounter (Signed)
Pt called requesting Rx for Celexa 20 mg 2/d. Stated he is weaning off Celexa and starting Lexapro 100 mg 1/d. He had 40 mg and was cutting in half, now completely out. Stated he has about two and half more weeks to take Celexa 20 mg 2/d. Apt 3/23. [facility]. ?

## 2021-10-31 NOTE — Progress Notes (Signed)
?Anthony Calhoun ?OFFICE PROGRESS NOTE ? ? ?Diagnosis: Colon cancer ? ?INTERVAL HISTORY:  ? ?[name] returns as scheduled.  Good appetite.  He underwent a colonoscopy last week (we do not have the report available today).  He reports polyps were removed. ?He continues to have depression and anxiety.  He stays in the house and bed most of the time.  He is seeing a new psychiatrist.  He is being tapered off of Celexa and starting Zoloft. ?He has discomfort in the back when he first gets out of bed.  The pain resolves when he moves around. ? ?Objective: ? ?Vital signs in last 24 hours: ? ?Blood pressure 122/75, pulse 80, temperature 97.8 ?F (36.6 ?C), temperature source Oral, resp. rate 18, height 5' 8"  (1.727 m), weight 276 lb 12.8 oz (125.6 kg), SpO2 98 %. ?  ? ?HEENT: Neck without mass ?Lymphatics: No cervical, supraclavicular, axillary, or inguinal nodes ?Resp: Lungs clear bilaterally ?Cardio: Regular rate and rhythm ?GI: No hepatosplenomegaly ?Vascular: No leg edema ? ?Lab Results: ? ?Lab Results  ?Component Value Date  ? WBC 8.8 10/07/2020  ? HGB 13.6 10/07/2020  ? HCT 40.0 10/07/2020  ? MCV 91.7 10/07/2020  ? PLT 343 10/07/2020  ? NEUTROABS 5.6 10/07/2020  ? ? ?CMP  ?Lab Results  ?Component Value Date  ? NA 136 03/07/2021  ? K 3.9 03/07/2021  ? CL 101 03/07/2021  ? CO2 26 03/07/2021  ? GLUCOSE 124 (H) 03/07/2021  ? BUN 12 03/07/2021  ? CREATININE 0.86 03/07/2021  ? CALCIUM 9.0 03/07/2021  ? PROT 7.2 03/07/2021  ? ALBUMIN 4.1 03/07/2021  ? AST 28 03/07/2021  ? ALT 25 03/07/2021  ? ALKPHOS 100 03/07/2021  ? BILITOT 0.6 03/07/2021  ? GFRNONAA >60 03/07/2021  ? GFRAA >60 05/10/2020  ? ? ?Lab Results  ?Component Value Date  ? CEA1 1.08 03/07/2021  ? CEA 5.77 (H) 10/31/2021  ? ? ?Medications: I have reviewed the patient's current medications. ? ? 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 secondary to nausea (patient discontinued Xeloda on day 11 due to colonoscopy) ?Colonoscopy 07/09/2020-nodular mucosa at the colonic anastomosis, biopsied; patent end-to-side ileocolonic anastomosis characterized by healthy-appearing mucosa and visible sutures; nonbleeding internal hemorrhoids, biopsy with benign ulcerated anastomotic mucosa with granulation tissue ?Cycle 4 adjuvant Xeloda 07/19/2020, 2000 mg every morning and 1500 mg every afternoon ?Cycle 5 adjuvant Xeloda 08/09/2020 ?Cycle 6 adjuvant Xeloda 08/31/2019 ?Cycle 7 adjuvant Xeloda 09/20/2020 ?Cycle 8 adjuvant Xeloda 10/11/2020 ?CTs 03/07/2021-no evidence of recurrent disease, hepatic steatosis, slight wall thickening at the junction of the descending and horizontal duodenum ?Microcytic anemia secondary to #1-improved ?Colon polyps-sessile serrated adenoma of the descending colon, tubular adenomas with focal high-grade dysplasia of the transverse colon on colonoscopy 02/12/2020, tubular adenoma of the mid ascending colon on the surgical specimen 04/12/2020 ?Family history of colon cancer ?Depression ?Anxiety/panic attacks ?Hypertension ?Hyperlipidemia ?Nausea secondary to Xeloda?-Resolved ?Gastroesophageal reflux disease-improved following discontinuation of Xeloda ?  ?  ?Disposition: ?[name] was diagnosed with stage II colon cancer in August 2021.  He is in clinical remission.  The CEA  has been mildly elevated over the past few months.  This is likely benign variation in the CEA.  He will return for a CEA in 1 month.  If the CEA is higher we will submit peripheral blood  for circulating tumor DNA testing.  He is scheduled for an office visit and restaging CTs in June. ? ?We will follow-up on the colonoscopy report from last week. ? ?He will continue follow-up with psychiatry for management of anxiety and depression. ? ?Betsy Coder, MD ? ?10/31/2021  ?9:48 AM ? ? ?

## 2021-10-31 NOTE — Telephone Encounter (Signed)
Rx sent 

## 2021-11-01 ENCOUNTER — Telehealth: Payer: Self-pay | Admitting: Adult Health

## 2021-11-01 NOTE — Telephone Encounter (Signed)
Noted - so he plans to follow up with [facility] testing.

## 2021-11-01 NOTE — Telephone Encounter (Signed)
Next visit is 11/17/21. Anthony Calhoun's mom, Anthony Calhoun called. She states he went to the doctor yesterday and wanted to give an update. She is listed on his DPR to speak to. Her phone number is [phone]. ?

## 2021-11-01 NOTE — Telephone Encounter (Signed)
He is going to F/U with Dr. Benay Spice. I don't know if it is the same testing.  ?

## 2021-11-01 NOTE — Telephone Encounter (Signed)
Called and talked with patient. He has a h/o colon cancer and his CEA was elevated. He retested yesterday and it is still elevated, though no higher than the previous one. He is going to followup with DNA testing. He is thinking all the what ifs and is just struggling. He says the Xanax is not really helping right now.  ?

## 2021-11-02 NOTE — Telephone Encounter (Signed)
Called and spoke with patient.

## 2021-11-02 NOTE — Telephone Encounter (Signed)
Can any changes be made in medication regimen to help with his increased anxiety over this situation?  ?

## 2021-11-07 ENCOUNTER — Telehealth (HOSPITAL_COMMUNITY): Payer: Self-pay | Admitting: Psychiatry

## 2021-11-12 ENCOUNTER — Other Ambulatory Visit: Payer: Self-pay | Admitting: Adult Health

## 2021-11-12 DIAGNOSIS — F331 Major depressive disorder, recurrent, moderate: Secondary | ICD-10-CM

## 2021-11-12 DIAGNOSIS — F411 Generalized anxiety disorder: Secondary | ICD-10-CM

## 2021-11-12 DIAGNOSIS — F429 Obsessive-compulsive disorder, unspecified: Secondary | ICD-10-CM

## 2021-11-12 DIAGNOSIS — F410 Panic disorder [episodic paroxysmal anxiety] without agoraphobia: Secondary | ICD-10-CM

## 2021-11-16 ENCOUNTER — Ambulatory Visit: Payer: Self-pay

## 2021-11-16 ENCOUNTER — Other Ambulatory Visit: Payer: Self-pay

## 2021-11-16 ENCOUNTER — Ambulatory Visit (INDEPENDENT_AMBULATORY_CARE_PROVIDER_SITE_OTHER): Payer: 59 | Admitting: Surgery

## 2021-11-16 ENCOUNTER — Telehealth: Payer: Self-pay | Admitting: *Deleted

## 2021-11-16 ENCOUNTER — Encounter: Payer: Self-pay | Admitting: Surgery

## 2021-11-16 VITALS — Ht 66.0 in | Wt 277.2 lb

## 2021-11-16 DIAGNOSIS — M5416 Radiculopathy, lumbar region: Secondary | ICD-10-CM | POA: Diagnosis not present

## 2021-11-16 DIAGNOSIS — R1031 Right lower quadrant pain: Secondary | ICD-10-CM | POA: Diagnosis not present

## 2021-11-16 DIAGNOSIS — M4726 Other spondylosis with radiculopathy, lumbar region: Secondary | ICD-10-CM | POA: Diagnosis not present

## 2021-11-16 NOTE — Telephone Encounter (Addendum)
Anthony Calhoun is still having back pain that radiates to kidney area/groin. Saw Dr. Ricard Calhoun (ortho) today, who referred him back to his surgeon, Dr. Marcello Calhoun after plain xray films showed nothing. Dr. Marcello Calhoun said call PCP. Ortho note says PCP ordered a lumbar MRI, but his nurse said he did not. He is to return to ortho (Dr. Lorin Calhoun) in 2 weeks to review images. ?Anthony Calhoun is worried and asking if his CT scan to be done now instead of June (scheduled for repeat CEA on 4/6). He is waiting for PCP to call about ordering an MRI. Was told by PCP office to go to emergency room if he is in a lot of pain. ?He tells this RN the pain is rated "8" with movement is severe when he lifts his right leg and pain begins shooting into groin. Informed him that RN will get Dr. Gearldine Calhoun thoughts and call back tomorrow, however if pain is severe and he gets weakness in his leg, he should got to ER. Informed him that [facility] does not have MRI available. ?

## 2021-11-16 NOTE — Progress Notes (Addendum)
? ?Office Visit Note ?  ?Patient: Anthony Calhoun           ?Date of Birth: 14-Jan-1966           ?MRN: [id number] ?Visit Date: 11/16/2021 ?             ?Requested by: Antony Contras, MD ?[address] ?[address] ?[place],  [place] [address] ?PCP: Antony Contras, MD ? ? ?Assessment & Plan: ?Visit Diagnoses:  ?1. Radiculopathy, lumbar region   ?2. Other spondylosis with radiculopathy, lumbar region   ?3. Right lower quadrant abdominal pain   ? ? ?Plan: I reviewed the MRI report with patient.  I recommend that he follow-up with Dr. Lorin Mercy in 2 weeks to get Dr. Narda Amber opinion on this and discuss any potential treatment options.  I recommended that patient follow-up with his general surgeon to better evaluate the right-sided abdominal pain that he is more complaining about.  Question whether or not this could be from adhesions or some other issue.  Advised patient that I do not think that this is related to his lumbar spine and that with his history it is very important to get this evaluated.  He voices understanding. ? ?Follow-Up Instructions: Return in about 2 weeks (around 11/30/2021) for with dr yates to review lumbar MRI ordered by PCP.  ? ?Orders:  ?Orders Placed This Encounter  ?Procedures  ? XR Lumbar Spine Complete  ? ?No orders of the defined types were placed in this encounter. ? ? ? ? Procedures: ?No procedures performed ? ? ?Clinical Data: ?No additional findings. ? ? ?Subjective: ?Chief Complaint  ?Patient presents with  ? Lower Back - New Patient (Initial Visit)  ? ? ?HPI ?56 year old white male who is new patient to the office comes in today with complaints of low back pain.  Pay states that he had pain off and on for about 3 to 4 years.  He was seen by his PCP who ordered a lumbar MRI scan that was done on June 08, 2021.  That study showed: ? ?CLINICAL DATA:  56 year old male with low back pain radiating to the ?right groin for 3 months. Gastrointestinal cancer, colon cancer. ?  ?EXAM: ?MRI LUMBAR  SPINE WITHOUT CONTRAST ?  ?TECHNIQUE: ?Multiplanar, multisequence MR imaging of the lumbar spine was ?performed. No intravenous contrast was administered. ?  ?COMPARISON:  Restaging CT Chest, Abdomen, and Pelvis 03/07/2021. ?  ?FINDINGS: ?Segmentation:  Normal on the comparison. ?  ?Alignment: Stable lumbar lordosis. There is subtle grade 1 ?anterolisthesis of L5 on S1. ?  ?Vertebrae: Mildly heterogeneous background bone marrow signal, but ?within normal limits for 3 Tesla imaging. There is mild degenerative ?marrow edema at the right anterior L2 inferior endplate. Mild ?chronic endplate marrow signal changes elsewhere. There are bulky ?degenerative endplate osteophytes anteriorly on the right at L2-L3 ?and L5-S1. But no other marrow edema or acute osseous abnormality. ?No suspicious osseous lesion identified. ?  ?Conus medullaris and cauda equina: Conus extends to the T12-L1 ?level. No lower spinal cord or conus signal abnormality. ?  ?Paraspinal and other soft tissues: Negative. ?  ?Disc levels: ?  ?T11-T12: Negative. ?  ?T12-L1: Subtle left paracentral disc bulge or protrusion on series ?13, image 4. Otherwise negative. ?  ?L1-L2: Disc desiccation and mild disc space loss. Small to moderate ?left paracentral and central disc extrusion (series 13, images ?10-12). Mild mass effect on the ventral thecal sac but no ?significant spinal or lateral recess stenosis. No foraminal ?involvement or  stenosis. ?  ?L2-L3:  Negative. ?  ?L3-L4: Disc desiccation. Mild mostly far lateral disc bulging. No ?stenosis. ?  ?L4-L5: Disc desiccation. Mild far lateral disc bulging. Mild facet ?hypertrophy. Mild epidural lipomatosis. No significant stenosis. ?  ?L5-S1: Mild anterolisthesis. Disc desiccation with mild ?circumferential disc bulge and endplate spurring. Mild to moderate ?facet hypertrophy greater on the right. Epidural lipomatosis effaces ?CSF from the thecal sac at this level. Otherwise no convincing ?spinal or lateral  recess stenosis. And no convincing foraminal ?stenosis. ?  ?IMPRESSION: ?1. Mild degenerative endplate marrow edema at the right anterior L2 ?inferior endplate where there is bulky degenerative endplate ?spurring. No suspicious marrow lesion in the lumbar spine. ?  ?2. Intermittent lumbar disc degeneration, generally mild but most ?pronounced at L1-L2 and L5-S1. Grade 1 spondylolisthesis associated ?with the latter. ?A central disc herniation at L1-L2 does not result in neural ?impingement. ?And aside from epidural lipomatosis at L5-S1, there is lumbar spinal ?stenosis. ?  ?  ?Electronically Signed ?  By: Genevie Ann M.D. ?  On: 06/09/2021 10:04 ? ?Patient states that most of his pain is in the right upper lower quadrant.  This also extends down into his right groin area.  Patient had a robotic right colectomy performed for colon cancer April 13, 6159 by Dr. Leighton Ruff.  He states that he has not followed back up with her for this pain that he is having in the area of his surgery.  States that he has had some pain radiating down into the right leg but this is infrequent. ? ? ?Review of Systems ?No current cardiopulmonary GI/GU issues ? ?Objective: ?Vital Signs: Ht '5\' 6"'$  (1.676 m)   Wt 277 lb 3.2 oz (125.7 kg)   BMI 44.74 kg/m?  ? ?Physical Exam ?Constitutional:   ?   Appearance: He is obese.  ?HENT:  ?   Head: Normocephalic and atraumatic.  ?   Nose: Nose normal.  ?Eyes:  ?   Extraocular Movements: Extraocular movements intact.  ?Pulmonary:  ?   Effort: No respiratory distress.  ?Abdominal:  ?   Tenderness: There is abdominal tenderness (Right lower quadrant tenderness with palpation.).  ?Musculoskeletal:  ?   Comments: No lumbar paraspinal tenderness.  Negative logroll bilateral hips.  Negative straight leg raise.  No lower extremity weakness.  ?Neurological:  ?   Mental Status: He is alert and oriented to person, place, and time.  ?Psychiatric:     ?   Mood and Affect: Mood normal.  ? ? ?Ortho Exam ? ?Specialty  Comments:  ?No specialty comments available. ? ?Imaging: ?No results found. ? ? ?PMFS History: ?Patient Active Problem List  ? Diagnosis Date Noted  ? Colon cancer, ascending ([place]) 04/12/2020  ? ?Past Medical History:  ?Diagnosis Date  ? Anemia   ? Anxiety   ? Arthritis   ? back,   ? Cancer [facility])   ? cecum  ? Depression   ? Dyspnea   ? started with anemia  ? GERD (gastroesophageal reflux disease)   ? History of kidney stones   ? Hypertension   ? Neuromuscular disorder ([place])   ? carpal tunnel  ? Pre-diabetes   ?  ?History reviewed. No pertinent family history.  ?Past Surgical History:  ?Procedure Laterality Date  ? COLONOSCOPY    ? CYSTOSCOPY W/ URETEROSCOPY W/ LITHOTRIPSY  1981  ? ?Social History  ? ?Occupational History  ? Not on file  ?Tobacco Use  ? Smoking status: Never  ? Smokeless  tobacco: Never  ?Vaping Use  ? Vaping Use: Never used  ?Substance and Sexual Activity  ? Alcohol use: No  ? Drug use: No  ? Sexual activity: Not on file  ? ? ? ? ? ? ?

## 2021-11-17 ENCOUNTER — Telehealth: Payer: Self-pay | Admitting: *Deleted

## 2021-11-17 ENCOUNTER — Ambulatory Visit: Payer: 59 | Admitting: Adult Health

## 2021-11-17 NOTE — Telephone Encounter (Signed)
Per Dr. Benay Spice: MRI seems to be the best scan to perform for these symptoms. Doubtful related to his cancer diagnosis. Anthony Calhoun notified and he will reach out to Dr. Antony Contras (PCP) again today for MRI to be ordered as noted in ortho visit note yesterday. ?

## 2021-11-23 ENCOUNTER — Ambulatory Visit: Payer: 59 | Admitting: Orthopaedic Surgery

## 2021-11-24 ENCOUNTER — Other Ambulatory Visit: Payer: Self-pay | Admitting: Adult Health

## 2021-12-01 ENCOUNTER — Encounter: Payer: Self-pay | Admitting: Oncology

## 2021-12-01 ENCOUNTER — Inpatient Hospital Stay: Payer: 59 | Attending: Oncology

## 2021-12-01 ENCOUNTER — Telehealth: Payer: Self-pay | Admitting: *Deleted

## 2021-12-01 DIAGNOSIS — Z85038 Personal history of other malignant neoplasm of large intestine: Secondary | ICD-10-CM | POA: Diagnosis present

## 2021-12-01 DIAGNOSIS — C182 Malignant neoplasm of ascending colon: Secondary | ICD-10-CM

## 2021-12-01 LAB — CEA (ACCESS): CEA (CHCC): 6.78 ng/mL — ABNORMAL HIGH (ref 0.00–5.00)

## 2021-12-01 NOTE — Telephone Encounter (Signed)
Anthony Calhoun called with concern that his CEA is higher again and if that means he has cancer. Confirmed that it is slightly higher, but CEA elevations can occur with infection, smoking, inflammation. He has been having back trouble and MRI is next week. Informed him that we need to wait for [employer] Reveal test results, that can take 7 days to result. He will be called with results. Explained that this test looks for circulating cancer DNA. Encourage him not to worry. ?

## 2021-12-05 ENCOUNTER — Ambulatory Visit (INDEPENDENT_AMBULATORY_CARE_PROVIDER_SITE_OTHER): Payer: 59 | Admitting: Psychiatry

## 2021-12-05 DIAGNOSIS — F411 Generalized anxiety disorder: Secondary | ICD-10-CM | POA: Diagnosis not present

## 2021-12-05 NOTE — Progress Notes (Signed)
[facility] Counselor Initial Adult Exam ? ?Name: Anthony Calhoun ?Date: 12/05/2021 ?MRN: [id number] ?DOB: Jan 14, 1966 ?PCP: Antony Contras, MD ? ?Time spent: 58 minutes ? ? ?Guardian/Payee:  Patient   ? ?Paperwork requested:  No  ? ?Reason for Visit /Presenting Problem: anxiety, depression, ocd, fearful thinking,some panic ? ?Mental Status Exam: ?  ? ?Appearance:   Casual     ?Behavior:  Appropriate, Sharing, and Motivated  ?Motor:  Normal  ?Speech/Language:   Clear and Coherent  ?Affect:  Depressed and anxious  ?Mood:  anxious and depressed  ?Thought process:  goal directed  ?Thought content:    Obsessions and overthinking  ?Sensory/Perceptual disturbances:    WNL  ?Orientation:  oriented to person, place, time/date, situation, day of week, month of year, year, and stated date of December 05, 2021  ?Attention:  Good  ?Concentration:  Good  ?Memory:  WNL  ?Fund of knowledge:   Good  ?Insight:    Good  ?Judgment:   Good  ?Impulse Control:  Good  ? ?Reported Symptoms:  See symptoms above. ? ?Risk Assessment: ?Danger to Self:  No ?Self-injurious Behavior: No ?Danger to Others: No ?Duty to Warn:no ?Physical Aggression / Violence:No  ?Access to Firearms a concern: No  ?Gang Involvement:No  ?Patient / guardian was educated about steps to take if suicide or homicide risk level increases between visits: Denies any SI or HI. ?While future psychiatric events cannot be accurately predicted, the patient does not currently require acute inpatient psychiatric care and does not currently meet [facility] involuntary commitment criteria. ? ?Substance Abuse History: ?Current substance abuse: No    ? ?Past Psychiatric History:   ?Previous psychological history is significant for anxiety and depression ?Outpatient Providers:Regina Mozingo, Dr. Toy Care, Dr. Lajuana Ripple ?History of Psych Hospitalization: No  ?Psychological Testing:  n/a   ? ?Abuse History: ?Victim of No.,  n/a    ?Report needed: No. ?Victim of Neglect:No. ?Perpetrator of  n/a    ?Witness / Exposure to Domestic Violence: No   ?Protective Services Involvement: No  ?Witness to [employer] Violence:  No  ? ?Family History: No family history on file. Patient is single, in relationship with girlfriend. Divorced 5 yrs ago. Two adult kids ages 1 and 83, male and male, both living in [place].  2 yr old mother is living with patient. Dad died when patient was 2. Stepdad later died in 73. Has 2 half-sisters and 2 half-brothers. ? ?Living situation: the patient lives with their family (1 yr old mother lives with him and in fairly good health) ? ?Sexual Orientation:  Straight ? ?Relationship Status: divorced  ?Name of spouse / other:N/A ?            If a parent, number of children / ages: 2 grown adult kids , male 27 and male 64 and has 2 grandchildren ages 11 and 48  ? ?Support Systems; friends ?Family (son, daughter, girlfriend) ? ?Financial Stress:  Yes  ? ?Income/Employment/Disability: on leave from work due to medical issues; [employer] ? ?Military Service: No  ? ?Educational History: ?Education: [employer] ? ?Religion/Sprituality/World View:   Protestant ? ?Any cultural differences that may affect / interfere with treatment:  not applicable  ? ?Recreation/Hobbies: musician Careers adviser), fishing ? ?Stressors:Financial difficulties   ?Health problems   ? ?Strengths:  Supportive Relationships, Family, Friends, Spirituality, Hopefulness, Conservator, museum/gallery, and Able to Communicate Effectively ? ?Barriers:  "dying" due to medical problems ? ?Legal History: ?Pending legal issue / charges: The patient has no significant history  of legal issues. ?History of legal issue / charges:  none ? ?Medical History/Surgical History:Reviewed and patient confirms information below. ?Past Medical History:  ?Diagnosis Date  ? Anemia   ? Anxiety   ? Arthritis   ? back,   ? Cancer [facility])   ? cecum  ? Depression   ? Dyspnea   ? started with anemia  ? GERD (gastroesophageal reflux disease)   ? History of kidney stones   ?  Hypertension   ? Neuromuscular disorder ([place])   ? carpal tunnel  ? Pre-diabetes   ? ? ?Past Surgical History:  ?Procedure Laterality Date  ? COLONOSCOPY    ? CYSTOSCOPY W/ URETEROSCOPY W/ LITHOTRIPSY  1981  ? ? ?Medications: ?Current Outpatient Medications  ?Medication Sig Dispense Refill  ? citalopram (CELEXA) 20 MG tablet TAKE 1 TABLET BY MOUTH EVERY DAY 30 tablet 0  ? ALPRAZolam (XANAX) 1 MG tablet Take one tablet three times daily as needed for anxiety. 90 tablet 2  ? amLODipine (NORVASC) 5 MG tablet Take 5 mg by mouth daily.    ? atorvastatin (LIPITOR) 40 MG tablet Take 40 mg by mouth daily.    ? carvedilol (COREG) 6.25 MG tablet Take 6.25 mg by mouth 2 (two) times daily with a meal.    ? omeprazole (PRILOSEC) 20 MG capsule Take 20 mg by mouth in the morning and at bedtime.    ? promethazine (PHENERGAN) 12.5 MG tablet Take 1 tablet (12.5 mg total) by mouth every 6 (six) hours as needed for nausea or vomiting. 20 tablet 0  ? sertraline (ZOLOFT) 100 MG tablet TAKE 2 TABLETS BY MOUTH EVERY DAY 180 tablet 1  ? ?No current facility-administered medications for this visit.  ? ? ?Allergies  ?Allergen Reactions  ? Chlorthalidone Other (See Comments)  ? Cyclobenzaprine Other (See Comments)  ? Gabapentin Nausea Only  ? Losartan Potassium-Hctz Other (See Comments)  ? Prednisone Anxiety  ?  "panic attack" ?Panic attacks  ? ? ?Diagnoses:  ?  ICD-10-CM   ?1. Generalized anxiety disorder  F41.1   ?  ?(Also OCD, Depression, and some Panic attacks.) ? ? ?Treatment goal plan: ?Patient not signing treatment goal plan on computer screen due to [place]. ?Treatment goals: ?Treatment goals remain on treatment plan as patient works with strategies to achieve his goals.  Progress is assessed each session and noted in the "subject" and/or "plan" sections of treatment note. ?Long-term goal: ?Reduce overall level, frequency, and intensity of the anxiety so that daily functioning is not impaired. ?Short-term goal: ?Verbalize and  understanding of the role that fearful thinking plays in creating fears, excessive worry, and persistent anxiety symptoms.  ?Strategies: ?Identify, challenge, and replace fearful self-talk with positive, realistic, and empowering self-talk.  ? ? ?Plan of Care:  Patient is a 56 year old male who is referred for therapy by his med provider.  Today we completed his initial evaluation for therapy and also his initial treatment goal plan collaboratively. Patient had diagnosis of colon cancer, followed by surgery and chemo. Reports a couple yrs later, he had blood in stools and had recent testing done to detect any further cancer and is currently awaiting the results of that testing.  Patient denies any thoughts of harming himself or others.  He denies any current substance abuse.  Prior psychological history is significant for anxiety and depression.  He reports no prior history of abuse.  Patient is a single adult male who is in relationship with a girlfriend with whom he lives.  Was divorced 5 years ago.  Has 2 adult children, both of whom are supportive and are ages 29 and 50, including 23 male and 1 male.  They both live in [place].  His 4 year old mother is also living with patient and is in relatively good health for her age.  His father died when patient was 78 years old.  Stepdad later died in 39.  Patient has 2 half sisters and 2 half brothers with whom contact is maintained.  Patient also has 2 grandchildren ages 41 and 57 with whom he is close.  There are some financial stressors in the home due to patient currently being out of work for medical reasons.  Patient reports health problems are also quite stressful especially with this recurrence of symptoms that he fears may be cancer again.  Patient reports hobbies as being playing his guitar and fishing.  His strengths include supportive relationships, family, friends, a sense of spirituality and hopefulness, and a good self advocate.  No significant  history of any legal issues reported.  His mental status exam reveals appearance is casual, behavior is appropriate and motivated, motor skills normal, speech and language clear and coherent, affect and mood are anxiou

## 2021-12-07 ENCOUNTER — Ambulatory Visit: Payer: 59 | Admitting: Orthopaedic Surgery

## 2021-12-09 ENCOUNTER — Ambulatory Visit (INDEPENDENT_AMBULATORY_CARE_PROVIDER_SITE_OTHER): Payer: 59 | Admitting: Adult Health

## 2021-12-09 ENCOUNTER — Encounter: Payer: Self-pay | Admitting: Adult Health

## 2021-12-09 DIAGNOSIS — F411 Generalized anxiety disorder: Secondary | ICD-10-CM | POA: Diagnosis not present

## 2021-12-09 DIAGNOSIS — F429 Obsessive-compulsive disorder, unspecified: Secondary | ICD-10-CM

## 2021-12-09 DIAGNOSIS — F331 Major depressive disorder, recurrent, moderate: Secondary | ICD-10-CM

## 2021-12-09 DIAGNOSIS — G4700 Insomnia, unspecified: Secondary | ICD-10-CM

## 2021-12-09 DIAGNOSIS — F410 Panic disorder [episodic paroxysmal anxiety] without agoraphobia: Secondary | ICD-10-CM | POA: Diagnosis not present

## 2021-12-09 MED ORDER — ALPRAZOLAM 1 MG PO TABS: ORAL_TABLET | ORAL | 2 refills | Status: DC

## 2021-12-09 MED ORDER — SERTRALINE HCL 100 MG PO TABS: ORAL_TABLET | ORAL | 1 refills | Status: DC

## 2021-12-09 MED ORDER — ESZOPICLONE 3 MG PO TABS: 3.0000 mg | ORAL_TABLET | Freq: Every day | ORAL | 2 refills | Status: DC

## 2021-12-09 NOTE — Progress Notes (Signed)
Anthony Calhoun Renner ?[id number] ?[birth year] ?56 y.o. ? ?Subjective:  ? ?Patient ID:  Anthony Calhoun is a 56 y.o. (DOB 09/29/1965) male. ? ?Chief Complaint: No chief complaint on file. ? ? ?HPI ?Anthony Calhoun Blowers presents to the office today for follow-up of GAD, MDD, OCD, Panic attacks. ? ? ?Describes mood today as "better". Pleasant. Denies tearfulness. Mood symptoms - reports depression and anxiety. Feels irritable - starting to pass. Reports decreased worry and rumination - "it's getting a little better". Stating "I'm able to move forward and trying to deal with my situation". Reports a few days of "shutting down" and "then got going again". Treatment for colon cancer completed - just being monitored and awaiting some results and testing. ?Stable interest and motivation. Taking medications as prescribed.  ?Energy levels lower. Active, does not have a regular exercise routine.  ?Enjoys some usual interests and activities. Engaged Lives with fiance - has an adult son and daughter - 2 grandchildren. Spending time with family. ?Appetite adequate - "it's ok" - feeling nauseated. Weight stable - 276 pounds. ?Sleeps during the day and is alert at night thinking about things that need to be done. Averages 5 hours. ?Focus and concentration stable. Completing tasks. Managing aspects of household. Out of work currently - applied for disability and was denied. ?Denies SI or HI.  ?Denies AH or VH. ? ?Previous medication trials:  Celexa ? ?Review of Systems:  ?Review of Systems  ?Musculoskeletal:  Negative for gait problem.  ?Neurological:  Negative for tremors.  ?Psychiatric/Behavioral:    ?     Please refer to HPI  ? ?Medications: I have reviewed the patient's current medications. ? ?Current Outpatient Medications  ?Medication Sig Dispense Refill  ? eszopiclone 3 MG TABS Take 1 tablet (3 mg total) by mouth at bedtime. Take immediately before bedtime 30 tablet 2  ? ALPRAZolam (XANAX) 1 MG tablet Take one tablet three  times daily as needed for anxiety. 90 tablet 2  ? amLODipine (NORVASC) 5 MG tablet Take 5 mg by mouth daily.    ? atorvastatin (LIPITOR) 40 MG tablet Take 40 mg by mouth daily.    ? carvedilol (COREG) 6.25 MG tablet Take 6.25 mg by mouth 2 (two) times daily with a meal.    ? omeprazole (PRILOSEC) 20 MG capsule Take 20 mg by mouth in the morning and at bedtime.    ? promethazine (PHENERGAN) 12.5 MG tablet Take 1 tablet (12.5 mg total) by mouth every 6 (six) hours as needed for nausea or vomiting. 20 tablet 0  ? sertraline (ZOLOFT) 100 MG tablet TAKE 2 TABLETS BY MOUTH EVERY DAY 180 tablet 1  ? ?No current facility-administered medications for this visit.  ? ? ?Medication Side Effects: None ? ?Allergies:  ?Allergies  ?Allergen Reactions  ? Chlorthalidone Other (See Comments)  ? Cyclobenzaprine Other (See Comments)  ? Gabapentin Nausea Only  ? Losartan Potassium-Hctz Other (See Comments)  ? Prednisone Anxiety  ?  "panic attack" ?Panic attacks  ? ? ?Past Medical History:  ?Diagnosis Date  ? Anemia   ? Anxiety   ? Arthritis   ? back,   ? Cancer [facility])   ? cecum  ? Depression   ? Dyspnea   ? started with anemia  ? GERD (gastroesophageal reflux disease)   ? History of kidney stones   ? Hypertension   ? Neuromuscular disorder ([place])   ? carpal tunnel  ? Pre-diabetes   ? ? ?Past Medical History, Surgical history, Social history, and  Family history were reviewed and updated as appropriate.  ? ?Please see review of systems for further details on the patient's review from today.  ? ?Objective:  ? ?Physical Exam:  ?There were no vitals taken for this visit. ? ?Physical Exam ?Constitutional:   ?   General: He is not in acute distress. ?Musculoskeletal:     ?   General: No deformity.  ?Neurological:  ?   Mental Status: He is alert and oriented to person, place, and time.  ?   Coordination: Coordination normal.  ?Psychiatric:     ?   Attention and Perception: Attention and perception normal. He does not perceive auditory or visual  hallucinations.     ?   Mood and Affect: Mood normal. Mood is not anxious or depressed. Affect is not labile, blunt, angry or inappropriate.     ?   Speech: Speech normal.     ?   Behavior: Behavior normal.     ?   Thought Content: Thought content normal. Thought content is not paranoid or delusional. Thought content does not include homicidal or suicidal ideation. Thought content does not include homicidal or suicidal plan.     ?   Cognition and Memory: Cognition and memory normal.     ?   Judgment: Judgment normal.  ?   Comments: Insight intact  ? ? ?Lab Review:  ?   ?Component Value Date/Time  ? NA 136 03/07/2021 1223  ? K 3.9 03/07/2021 1223  ? CL 101 03/07/2021 1223  ? CO2 26 03/07/2021 1223  ? GLUCOSE 124 (H) 03/07/2021 1223  ? BUN 12 03/07/2021 1223  ? CREATININE 0.86 03/07/2021 1223  ? CALCIUM 9.0 03/07/2021 1223  ? PROT 7.2 03/07/2021 1223  ? ALBUMIN 4.1 03/07/2021 1223  ? AST 28 03/07/2021 1223  ? ALT 25 03/07/2021 1223  ? ALKPHOS 100 03/07/2021 1223  ? BILITOT 0.6 03/07/2021 1223  ? GFRNONAA >60 03/07/2021 1223  ? GFRAA >60 05/10/2020 1543  ? ? ?   ?Component Value Date/Time  ? WBC 8.8 10/07/2020 1507  ? WBC 10.3 04/14/2020 0458  ? RBC 4.36 10/07/2020 1507  ? HGB 13.6 10/07/2020 1507  ? HCT 40.0 10/07/2020 1507  ? PLT 343 10/07/2020 1507  ? MCV 91.7 10/07/2020 1507  ? MCH 31.2 10/07/2020 1507  ? MCHC 34.0 10/07/2020 1507  ? RDW 18.2 (H) 10/07/2020 1507  ? LYMPHSABS 2.3 10/07/2020 1507  ? MONOABS 0.7 10/07/2020 1507  ? EOSABS 0.2 10/07/2020 1507  ? BASOSABS 0.1 10/07/2020 1507  ? ? ?No results found for: POCLITH, LITHIUM  ? ?No results found for: PHENYTOIN, PHENOBARB, VALPROATE, CBMZ  ? ?.res ?Assessment: Plan:   ? ?Plan: ? ?PDMP reviewed ? ?Xanax '1mg'$  BID ?Zoloft '100mg'$  BID ?Lunesta '3mg'$  at bedtime ? ?RTC 4 weeks ? ?Patient advised to contact office with any questions, adverse effects, or acute worsening in signs and symptoms. ? ?Discussed potential benefits, risk, and side effects of benzodiazepines to  include potential risk of tolerance and dependence, as well as possible drowsiness.  Advised patient not to drive if experiencing drowsiness and to take lowest possible effective dose to minimize risk of dependence and tolerance.  ? ?Time spent with patient was 25 minutes. Greater than 50% of face to face time with patient was spent on counseling and coordination of care.  ?  ? ?Diagnoses and all orders for this visit: ? ?Generalized anxiety disorder ?-     ALPRAZolam (XANAX) 1 MG tablet; Take one tablet  three times daily as needed for anxiety. ?-     sertraline (ZOLOFT) 100 MG tablet; TAKE 2 TABLETS BY MOUTH EVERY DAY ? ?Panic attacks ?-     ALPRAZolam (XANAX) 1 MG tablet; Take one tablet three times daily as needed for anxiety. ?-     sertraline (ZOLOFT) 100 MG tablet; TAKE 2 TABLETS BY MOUTH EVERY DAY ? ?Obsessive-compulsive disorder, unspecified type ?-     sertraline (ZOLOFT) 100 MG tablet; TAKE 2 TABLETS BY MOUTH EVERY DAY ? ?Major depressive disorder, recurrent episode, moderate (HCC) ?-     sertraline (ZOLOFT) 100 MG tablet; TAKE 2 TABLETS BY MOUTH EVERY DAY ? ?Insomnia, unspecified type ?-     eszopiclone 3 MG TABS; Take 1 tablet (3 mg total) by mouth at bedtime. Take immediately before bedtime ? ?  ? ?Please see After Visit Summary for patient specific instructions. ? ?Future Appointments  ?Date Time Provider [place]  ?12/13/2021  9:00 AM Marybelle Killings, MD [facility] None  ?01/12/2022 11:00 AM Shanon Ace, LCSW [facility] None  ?02/13/2022  8:00 AM [facility] PHLEBOTOMIST [facility] None  ?02/15/2022  8:50 AM Benay Spice Izola Price, MD [facility] None  ?03/07/2022  9:30 AM [facility] None  ?03/14/2022  9:45 AM McKenzie, Candee Furbish, MD [facility] None  ? ? ?No orders of the defined types were placed in this encounter. ? ? ?------------------------------- ?

## 2021-12-13 ENCOUNTER — Ambulatory Visit: Payer: 59 | Admitting: Orthopaedic Surgery

## 2021-12-14 ENCOUNTER — Other Ambulatory Visit: Payer: Self-pay | Admitting: Oncology

## 2021-12-14 ENCOUNTER — Telehealth: Payer: Self-pay

## 2021-12-14 DIAGNOSIS — C182 Malignant neoplasm of ascending colon: Secondary | ICD-10-CM

## 2021-12-14 LAB — GUARDANT 360

## 2021-12-14 NOTE — Telephone Encounter (Signed)
Patient called in and wanted to speak with Dr. Benay Spice. Patient wanted to know when was his PET scan and office visit. Also he had questions about his blood work. He stated he can come in anytime. I called and scheduler his PET scan at [facility] on May, 5 at 9, arrived at 94 NPO after midnight and his office visit was schedule for May 8 at 820 with the provider. Patient gave verbal understanding and had no further questions or concerns at this time. ?

## 2021-12-19 ENCOUNTER — Encounter: Payer: Self-pay | Admitting: Oncology

## 2021-12-19 ENCOUNTER — Telehealth: Payer: Self-pay | Admitting: *Deleted

## 2021-12-19 ENCOUNTER — Encounter: Payer: Self-pay | Admitting: *Deleted

## 2021-12-19 NOTE — Progress Notes (Signed)
PATIENT NAVIGATOR PROGRESS NOTE ? ?Name: Anthony Calhoun ?Date: 12/19/2021 ?MRN: [id number]  ?DOB: May 09, 1966 ? ? ?Reason for visit:  ?Phone call from patient requesting clarification regarding Guardant 360 test ? ?Comments:  Patient called to discuss "postive" Guardant 360 test results that Dr Benay Spice discussed with pt last week. Discussed that the test is only part of testing and that clarification will come from PET scan scheduled for May 5.  Pt will see Dr Benay Spice on May 9th to review all results with him. ? ?Had long discussion about living as a cancer survivor and the anxiety associated with that. Discussed distraction activities that he likes to enagage in to cope. Encouraged him to call his psychologist and set up a F/U appt with them to discuss further coping skills. ? ?Verbalized understanding.  ? ? ? ?Time spent counseling/coordinating care: 30-45 minutes ? ?

## 2021-12-20 ENCOUNTER — Encounter: Payer: Self-pay | Admitting: Oncology

## 2021-12-21 ENCOUNTER — Ambulatory Visit: Payer: 59 | Admitting: Psychiatry

## 2021-12-23 ENCOUNTER — Encounter: Payer: Self-pay | Admitting: Oncology

## 2021-12-26 ENCOUNTER — Ambulatory Visit (HOSPITAL_COMMUNITY): Payer: 59

## 2021-12-30 ENCOUNTER — Ambulatory Visit (HOSPITAL_COMMUNITY)
Admission: RE | Admit: 2021-12-30 | Discharge: 2021-12-30 | Disposition: A | Discharge status: Home / Self Care | Payer: 59 | Source: Ambulatory Visit | Attending: Oncology | Admitting: Oncology

## 2021-12-30 DIAGNOSIS — C182 Malignant neoplasm of ascending colon: Secondary | ICD-10-CM | POA: Insufficient documentation

## 2021-12-30 LAB — GLUCOSE, CAPILLARY: Glucose-Capillary: 148 mg/dL — ABNORMAL HIGH (ref 70–99)

## 2021-12-30 MED ORDER — FLUDEOXYGLUCOSE F - 18 (FDG) INJECTION
13.6800 | Freq: Once | INTRAVENOUS | Status: AC | PRN
  Administered 2021-12-30: 13.68 via INTRAVENOUS

## 2022-01-02 ENCOUNTER — Telehealth: Payer: Self-pay

## 2022-01-02 ENCOUNTER — Encounter: Payer: Self-pay | Admitting: Oncology

## 2022-01-02 NOTE — Telephone Encounter (Signed)
Pt verbalized understanding. Has an appointment tomorrow with Dr Benay Spice. ?

## 2022-01-02 NOTE — Telephone Encounter (Signed)
-----   Message from Ladell Pier, MD sent at 12/31/2021 12:24 PM EDT ----- ?Please call patient, PET shows 1 liver lesion suspicious for cancer, will discuss 5/9, will consider an MRI liver to exclude other lesions and will then decide on ablation or surgical removal ? ?

## 2022-01-03 ENCOUNTER — Inpatient Hospital Stay: Payer: 59 | Attending: Oncology | Admitting: Oncology

## 2022-01-03 VITALS — BP 145/75 | HR 89 | Temp 98.1°F | Resp 18 | Ht 66.0 in | Wt 271.8 lb

## 2022-01-03 DIAGNOSIS — C182 Malignant neoplasm of ascending colon: Secondary | ICD-10-CM | POA: Diagnosis not present

## 2022-01-03 DIAGNOSIS — D509 Iron deficiency anemia, unspecified: Secondary | ICD-10-CM | POA: Diagnosis not present

## 2022-01-03 DIAGNOSIS — Z79899 Other long term (current) drug therapy: Secondary | ICD-10-CM | POA: Insufficient documentation

## 2022-01-03 DIAGNOSIS — Z85038 Personal history of other malignant neoplasm of large intestine: Secondary | ICD-10-CM | POA: Diagnosis not present

## 2022-01-03 NOTE — Progress Notes (Signed)
?[facility] ?OFFICE PROGRESS NOTE ? ? ?Diagnosis: Colon cancer ? ?INTERVAL HISTORY:  ? ?Anthony Calhoun returns for a scheduled visit.  He is anxious about the probable diagnosis of metastatic colon cancer.  I spoke to him by telephone on 12/28/2021.  He otherwise feels well. ? ?Objective: ? ?Vital signs in last 24 hours: ? ?Blood pressure (!) 145/75, pulse 89, temperature 98.1 ?F (36.7 ?C), temperature source Oral, resp. rate 18, height 5' 6"  (1.676 m), weight 271 lb 12.8 oz (123.3 kg), SpO2 95 %. ?  ? ?Lymphatics: No cervical, supraclavicular, axillary, or inguinal nodes ?Resp: Lungs clear bilaterally ?Cardio: Regular rate and rhythm ?GI: No hepatosplenomegaly, nontender, no mass ?Vascular: No leg edema ? ?Lab Results: ? ?Lab Results  ?Component Value Date  ? WBC 8.8 10/07/2020  ? HGB 13.6 10/07/2020  ? HCT 40.0 10/07/2020  ? MCV 91.7 10/07/2020  ? PLT 343 10/07/2020  ? NEUTROABS 5.6 10/07/2020  ? ? ?CMP  ?Lab Results  ?Component Value Date  ? NA 136 03/07/2021  ? K 3.9 03/07/2021  ? CL 101 03/07/2021  ? CO2 26 03/07/2021  ? GLUCOSE 124 (H) 03/07/2021  ? BUN 12 03/07/2021  ? CREATININE 0.86 03/07/2021  ? CALCIUM 9.0 03/07/2021  ? PROT 7.2 03/07/2021  ? ALBUMIN 4.1 03/07/2021  ? AST 28 03/07/2021  ? ALT 25 03/07/2021  ? ALKPHOS 100 03/07/2021  ? BILITOT 0.6 03/07/2021  ? GFRNONAA >60 03/07/2021  ? GFRAA >60 05/10/2020  ? ? ?Lab Results  ?Component Value Date  ? CEA1 1.08 03/07/2021  ? CEA 6.78 (H) 12/01/2021  ? ? ?No results found for: INR, LABPROT ? ?Imaging: ? ?NM PET Image Initial (PI) Skull Base To Thigh (F-18 FDG) ? ?Result Date: 12/30/2021 ?CLINICAL DATA:  Subsequent treatment strategy for colon cancer. Rising CEA level with circulating tumor DNA. EXAM: NUCLEAR MEDICINE PET SKULL BASE TO THIGH TECHNIQUE: 13.7 mCi F-18 FDG was injected intravenously. Full-ring PET imaging was performed from the skull base to thigh after the radiotracer. CT data was obtained and used for attenuation correction and  anatomic localization. Fasting blood glucose: 148 mg/dl COMPARISON:  Multiple exams, including 03/07/2021 FINDINGS: Mediastinal blood pool activity: SUV max 3.6 Liver activity: SUV max NA NECK: No significant abnormal hypermetabolic activity in this region. Incidental CT findings: none CHEST: No significant abnormal hypermetabolic activity in this region. Incidental CT findings: Left anterior descending and right coronary artery atherosclerosis. Mild cardiomegaly. ABDOMEN/PELVIS: A hypermetabolic right hepatic lobe lesion with metabolic activity measuring about 1.8 by 1.4 by 2.3 cm is observed with maximum SUV 11.8, compatible with a metastatic lesion to the liver. This lesion is not conspicuous on the noncontrast CT data. No other definite liver lesion is identified. Accentuated activity noted focally in a loop of mid abdominal small bowel about on image 130 of series 4 near the midline, maximum SUV 12.5, this could be from physiological peristaltic activity or tumor along or within the small bowel at this location. There is some other areas of accentuated metabolic activity including in the sigmoid colon and transverse colon which are probably physiologic given the lack of associated CT abnormality, although technically nonspecific. Incidental CT findings: Postoperative findings from partial right colectomy. Atherosclerosis is present, including aortoiliac atherosclerotic disease. SKELETON: No significant focal abnormal skeletal activity. Incidental CT findings: Thoracolumbar spondylosis. IMPRESSION: 1. Hypermetabolic lesion in the right hepatic lobe with maximum SUV 11.8, compatible with metastatic disease to the liver. 2. Foci of accentuated activity in the bowel, most notably  in the mid abdominal loop of small bowel, probably physiologic although strictly speaking tumor involvement within or along the central abdominal loop of small bowel is difficult to totally exclude. 3. Other imaging findings of potential  clinical significance: Aortic Atherosclerosis (ICD10-I70.0). Coronary atherosclerosis and mild cardiomegaly. Thoracolumbar spondylosis. Electronically Signed   By: Van Clines M.D.   On: 12/30/2021 13:08   ? ?Medications: I have reviewed the patient's current medications. ? ? 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 secondary to nausea (patient discontinued Xeloda on day 11 due to colonoscopy) ?Colonoscopy 07/09/2020-nodular mucosa at the colonic anastomosis, biopsied; patent end-to-side ileocolonic anastomosis characterized by healthy-appearing mucosa and visible sutures; nonbleeding internal hemorrhoids, biopsy with benign ulcerated anastomotic mucosa with granulation tissue ?Cycle 4 adjuvant Xeloda 07/19/2020, 2000 mg every morning and 1500 mg every afternoon ?Cycle 5 adjuvant Xeloda 08/09/2020 ?Cycle 6 adjuvant Xeloda 08/31/2019 ?Cycle 7 adjuvant Xeloda 09/20/2020 ?Cycle 8 adjuvant Xeloda 10/11/2020 ?CTs 03/07/2021-no evidence of recurrent disease, hepatic steatosis, slight wall thickening at the junction of the descending and horizontal duodenum ?Mild elevation of CEA 2023 ?12/01/2021-Guardant Reveal-ct DNA detected ?PET 12/30/2021-hypermetabolic right liver lesion, hypermetabolic area in a loop of mid small bowel with an SUV of 12.5 ?Microcytic anemia secondary to #1-improved ?Colon polyps-sessile serrated adenoma of the descending colon, tubular adenomas with focal high-grade dysplasia of the transverse colon on colonoscopy  02/12/2020, tubular adenoma of the mid ascending colon on the surgical specimen 04/12/2020 ?Family history of colon cancer ?Depression ?Anxiety/panic attacks ?Hypertension ?Hyperlipidemia ?Nausea secondary to Xeloda?-Resolved ?Gastroesophageal reflux disease-improved following discontinuation of Xeloda ?  ?  ? ? ?Disposition: ?I discussed the PET findings and reviewed the images with Mr. Trombly.  We discussed the positive Guardant Reveal assay.  He understands there is a high chance he has metastatic colon cancer.  We discussed additional staging and treatment options. ? ?He may be a candidate for resection or ablation of the isolated liver lesion.  I will present his case at the GI tumor conference within the next 2 weeks.  We will review the PET images and discussed the possible small bowel lesion.  He will be referred for a diagnostic MRI of the liver to look for evidence of additional metastases. ? ?He will return for an office visit and further discussion on 01/12/2022. ? ?Betsy Coder, MD ? ?01/03/2022  ?8:41 AM ? ? ?

## 2022-01-04 ENCOUNTER — Encounter: Payer: Self-pay | Admitting: *Deleted

## 2022-01-04 ENCOUNTER — Other Ambulatory Visit: Payer: Self-pay | Admitting: *Deleted

## 2022-01-04 DIAGNOSIS — C182 Malignant neoplasm of ascending colon: Secondary | ICD-10-CM

## 2022-01-04 NOTE — Progress Notes (Signed)
Patient left message that [facility] does not accept his insurance. Requested [facility].  ?Scheduled MRI for 01/09/22 at 9 pm with 8:30 pm check-in in the ER. NPO for 4 hours prior. No day times were available. Notified patient and provided phone # to call to reschedule if he needs to. ?

## 2022-01-04 NOTE — Progress Notes (Signed)
Called  Imaging to schedule MRI liver and was informed they will need to call patient to schedule. Requesting they reach out to him soon. ?

## 2022-01-05 ENCOUNTER — Ambulatory Visit: Payer: 59 | Admitting: Psychiatry

## 2022-01-06 ENCOUNTER — Telehealth: Payer: Self-pay

## 2022-01-06 NOTE — Telephone Encounter (Signed)
Patient called in stated his Pet Scan was cancel and he wanted to know why. I called the patient to let him know his appointment was cancel due to his insurance have not yet authorized the scan. I also let the patient know as soon as the insurance approval it we will schedule the scan. Patient gave verbal understanding and  had no further questions or concerns. ?

## 2022-01-09 ENCOUNTER — Encounter: Payer: Self-pay | Admitting: *Deleted

## 2022-01-09 ENCOUNTER — Ambulatory Visit (HOSPITAL_COMMUNITY): Payer: 59

## 2022-01-10 ENCOUNTER — Ambulatory Visit (HOSPITAL_COMMUNITY)
Admission: RE | Admit: 2022-01-10 | Discharge: 2022-01-10 | Disposition: A | Discharge status: Home / Self Care | Payer: 59 | Source: Ambulatory Visit | Attending: Oncology | Admitting: Oncology

## 2022-01-10 DIAGNOSIS — C182 Malignant neoplasm of ascending colon: Secondary | ICD-10-CM | POA: Insufficient documentation

## 2022-01-10 MED ORDER — GADOBUTROL 1 MMOL/ML IV SOLN
10.0000 mL | Freq: Once | INTRAVENOUS | Status: AC | PRN
  Administered 2022-01-10: 10 mL via INTRAVENOUS

## 2022-01-11 ENCOUNTER — Telehealth: Payer: Self-pay | Admitting: *Deleted

## 2022-01-11 ENCOUNTER — Other Ambulatory Visit: Payer: Self-pay

## 2022-01-11 NOTE — Telephone Encounter (Signed)
Notified [facility] of MRI liver results and that there is a single lesion that when discussed in tumor board today, Dr. Zenia Resides feels is resectable. A referral has been placed for consult. Radiologist felt the small area in small bowel on prior scan was benign/nothing of concern. ?Patient's mother expressed apprehension of  having a male Psychologist, sport and exercise and I reassured her that Dr. Zenia Resides specializes in this type of surgery and an excellent surgeon. Ulysee is comfortable seeing Dr. Zenia Resides. He had many questions and it was suggested for him to write them all down to go over with Dr. Benay Spice tomorrow. ?

## 2022-01-11 NOTE — Progress Notes (Signed)
The proposed treatment discussed in conference is for discussion purpose only and is not a binding recommendation.  The patients have not been physically examined, or presented with their treatment options.  Therefore, final treatment plans cannot be decided.  

## 2022-01-12 ENCOUNTER — Ambulatory Visit: Payer: 59 | Admitting: Psychiatry

## 2022-01-12 ENCOUNTER — Encounter: Payer: Self-pay | Admitting: Oncology

## 2022-01-12 ENCOUNTER — Inpatient Hospital Stay (HOSPITAL_BASED_OUTPATIENT_CLINIC_OR_DEPARTMENT_OTHER): Payer: 59 | Admitting: Oncology

## 2022-01-12 VITALS — BP 130/74 | HR 90 | Temp 98.2°F | Resp 18 | Ht 66.0 in | Wt 272.0 lb

## 2022-01-12 DIAGNOSIS — C182 Malignant neoplasm of ascending colon: Secondary | ICD-10-CM | POA: Diagnosis not present

## 2022-01-12 DIAGNOSIS — Z85038 Personal history of other malignant neoplasm of large intestine: Secondary | ICD-10-CM | POA: Diagnosis not present

## 2022-01-12 NOTE — Progress Notes (Signed)
[facility] OFFICE PROGRESS NOTE   Diagnosis: Colon cancer  INTERVAL HISTORY:   Mr. Vences returns as scheduled.  He feels well other than anxiety related to the probable diagnosis of metastatic colon cancer.  He is scheduled to see Dr. Zenia Resides tomorrow. His case was presented at the GI tumor conference yesterday.  He appears to have an isolated liver metastasis.  Surgical resection is recommended.  Objective:  Vital signs in last 24 hours:  Blood pressure 130/74, pulse 90, temperature 98.2 F (36.8 C), temperature source Oral, resp. rate 18, height 5' 6" (1.676 m), weight 272 lb (123.4 kg), SpO2 97 %. Physical examination-not performed today Lab Results:  Lab Results  Component Value Date   WBC 8.8 10/07/2020   HGB 13.6 10/07/2020   HCT 40.0 10/07/2020   MCV 91.7 10/07/2020   PLT 343 10/07/2020   NEUTROABS 5.6 10/07/2020    CMP  Lab Results  Component Value Date   NA 136 03/07/2021   K 3.9 03/07/2021   CL 101 03/07/2021   CO2 26 03/07/2021   GLUCOSE 124 (H) 03/07/2021   BUN 12 03/07/2021   CREATININE 0.86 03/07/2021   CALCIUM 9.0 03/07/2021   PROT 7.2 03/07/2021   ALBUMIN 4.1 03/07/2021   AST 28 03/07/2021   ALT 25 03/07/2021   ALKPHOS 100 03/07/2021   BILITOT 0.6 03/07/2021   GFRNONAA >60 03/07/2021   GFRAA >60 05/10/2020    Lab Results  Component Value Date   CEA1 1.08 03/07/2021   CEA 6.78 (H) 12/01/2021    Imaging:  MR LIVER W WO CONTRAST  Result Date: 01/11/2022 CLINICAL DATA:  56 year old male with history of metastatic colon cancer. Staging examination. EXAM: MRI ABDOMEN WITHOUT AND WITH CONTRAST TECHNIQUE: Multiplanar multisequence MR imaging of the abdomen was performed both before and after the administration of intravenous contrast. CONTRAST:  63m GADAVIST GADOBUTROL 1 MMOL/ML IV SOLN COMPARISON:  PET-CT 12/30/2021. CT the chest, abdomen and pelvis 03/07/2021. FINDINGS: Lower chest: Small hiatal hernia. Hepatobiliary: Diffuse  loss of signal intensity throughout the hepatic parenchyma on out of phase dual echo images, indicative of a background of hepatic steatosis. Liver has a slightly nodular contour suggesting early changes of cirrhosis. In the right lobe of the liver between segments 7 and 8 (axial image 12 of series 4) there is a 1.2 cm lesion which is T1 hypointense, mildly T2 hyperintense, demonstrates restricted diffusion, and demonstrates peripheral enhancement on early post gadolinium imaging, which persists on delayed imaging, with central hypervascularity. No other definite suspicious hepatic lesions are noted. 2 mm T1 hypointense, T2 hyperintense, nonenhancing lesion in segment 6 of the liver incidentally noted, compatible with a tiny cyst or biliary hamartoma. No intra or extrahepatic biliary ductal dilatation. Gallbladder is normal in appearance. Pancreas: No pancreatic mass. No pancreatic ductal dilatation. No pancreatic or peripancreatic fluid collections or inflammatory changes. Spleen:  Unremarkable. Adrenals/Urinary Tract: Bilateral kidneys and bilateral adrenal glands are normal in appearance. No hydroureteronephrosis in the visualized portions of the abdomen. Stomach/Bowel: Visualized portions are unremarkable. Vascular/Lymphatic: Aortic atherosclerosis without evidence of aneurysm in the abdominal vasculature. No lymphadenopathy noted in the abdomen. Other: No significant volume of ascites noted in the visualized portions of the peritoneal cavity. Musculoskeletal: No aggressive appearing osseous lesions are noted in the visualized portions of the skeleton. IMPRESSION: 1. Solitary lesion measuring 1.2 cm in the right lobe of the liver between segments 7 and 8 of the liver which has imaging characteristics most compatible with a metastatic lesion.  2. 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 Hepatic steatosis. 3. Small hiatal hernia. Electronically Signed   By: Vinnie Langton M.D.   On:  01/11/2022 09:48    Medications: I have reviewed the patient's current medications.   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 secondary to nausea (patient discontinued Xeloda on day 11 due to colonoscopy) Colonoscopy 07/09/2020-nodular mucosa at the colonic anastomosis, biopsied; patent end-to-side ileocolonic anastomosis characterized by healthy-appearing mucosa and visible sutures; nonbleeding internal hemorrhoids, biopsy with benign ulcerated anastomotic mucosa with granulation tissue Cycle 4 adjuvant Xeloda 07/19/2020, 2000 mg every morning and 1500 mg every afternoon Cycle 5 adjuvant Xeloda 08/09/2020 Cycle 6 adjuvant Xeloda 08/31/2019 Cycle 7 adjuvant Xeloda 09/20/2020 Cycle 8 adjuvant Xeloda 10/11/2020 CTs 03/07/2021-no evidence of recurrent disease, hepatic steatosis, slight wall thickening at the junction of the descending and horizontal duodenum Mild elevation of CEA 2023 12/01/2021-Guardant Reveal-ct DNA detected PET 12/30/2021-hypermetabolic right liver lesion, hypermetabolic area in a loop of mid small bowel with an SUV of 12.5, review of PET images at GI tumor conference 01/11/2022-small bowel uptake felt to be a benign finding MRI liver 01/10/2022-solitary 1.2 cm right liver lesion between segments 7 and 8 compatible with metastasis, subtle changes suggesting cirrhosis, hepatic steatosis Microcytic anemia secondary to #1-improved Colon polyps-sessile serrated adenoma of the descending colon, tubular  adenomas with focal high-grade dysplasia of the transverse colon on colonoscopy 02/12/2020, tubular adenoma of the mid ascending colon on the surgical specimen 04/12/2020 Family history of colon cancer Depression Anxiety/panic attacks Hypertension Hyperlipidemia Nausea secondary to Xeloda?-Resolved Gastroesophageal reflux disease-improved following discontinuation of Xeloda     Disposition: Mr. Siever appears stable.  He appears to have an isolated liver metastasis based on the PET and MRI findings.  His case was presented at the GI tumor conference yesterday.  The lesion appears resectable.  Resection is recommended.  He is scheduled to see Dr. Zenia Resides tomorrow. We discussed resection versus an ablation procedure.  He will discuss these options further with Dr. Zenia Resides.  We also discussed the possibility that the lesion could represent a primary liver tumor.  We discussed the less than 50% chance that resection of the liver lesion will be curative.  He may have early cirrhosis, potentially related to steatosis.  I will see him after surgery to review the pathology and discuss adjuvant treatment options.  Betsy Coder, MD  01/12/2022  8:15 AM

## 2022-01-16 ENCOUNTER — Other Ambulatory Visit: Payer: Self-pay | Admitting: Surgery

## 2022-01-16 DIAGNOSIS — C189 Malignant neoplasm of colon, unspecified: Secondary | ICD-10-CM

## 2022-01-17 ENCOUNTER — Other Ambulatory Visit: Payer: Self-pay | Admitting: *Deleted

## 2022-01-17 ENCOUNTER — Encounter: Payer: Self-pay | Admitting: *Deleted

## 2022-01-17 ENCOUNTER — Ambulatory Visit
Admission: RE | Admit: 2022-01-17 | Discharge: 2022-01-17 | Disposition: A | Discharge status: Home / Self Care | Payer: 59 | Source: Ambulatory Visit | Attending: Surgery | Admitting: Surgery

## 2022-01-17 ENCOUNTER — Encounter: Payer: Self-pay | Admitting: Licensed Clinical Social Worker

## 2022-01-17 DIAGNOSIS — C182 Malignant neoplasm of ascending colon: Secondary | ICD-10-CM

## 2022-01-17 DIAGNOSIS — C189 Malignant neoplasm of colon, unspecified: Secondary | ICD-10-CM

## 2022-01-17 HISTORY — PX: IR RADIOLOGIST EVAL & MGMT: IMG5224

## 2022-01-17 NOTE — Progress Notes (Signed)
[facility] Work  Initial Assessment   Anthony Calhoun is a 56 y.o. year old male contacted by phone. Clinical Social Work was referred by nurse navigator for assessment of psychosocial needs.   SDOH (Social Determinants of Health) assessments performed: Yes SDOH Interventions    Flowsheet Row Most Recent Value  SDOH Interventions   Food Insecurity Interventions Other (Comment), Assist with SNAP Application  [DSS for SNAP benefits]  Financial Strain Interventions Intervention Not Indicated  Housing Interventions Intervention Not Indicated  Physical Activity Interventions Intervention Not Indicated  Stress Interventions Intervention Not Indicated, Other (Comment)  [Patient active with psychologist]  Social Connections Interventions Intervention Not Indicated  Transportation Interventions Intervention Not Indicated, Patient Resources Tax adviser)       SDOH Screenings   Alcohol Screen: Low Risk    Last Alcohol Screening Score (AUDIT): 1  Depression (PHQ2-9): Low Risk    PHQ-2 Score: 3  Financial Resource Strain: Medium Risk   Difficulty of Paying Living Expenses: Somewhat hard  Food Insecurity: Landscape architect Present   Worried About Charity fundraiser in the Last Year: Sometimes true   Arboriculturist in the Last Year: Never true  Housing: Low Risk    Last Housing Risk Score: 0  Physical Activity: Inactive   Days of Exercise per Week: 0 days   Minutes of Exercise per Session: 0 min  Social Connections: Socially Isolated   Frequency of Communication with Friends and Family: Three times a week   Frequency of Social Gatherings with Friends and Family: Three times a week   Attends Religious Services: Never   Active Member of Clubs or Organizations: No   Attends Archivist Meetings: Never   Marital Status: Divorced  Stress: Stress Concern Present   Feeling of Stress : Rather much  Tobacco Use: Low Risk    Smoking Tobacco Use: Never   Smokeless  Tobacco Use: Never   Passive Exposure: Not on file  Transportation Needs: No Transportation Needs   Lack of Transportation (Medical): No   Lack of Transportation (Non-Medical): No     Distress Screen completed: Yes    05/10/2020    2:33 PM  ONCBCN DISTRESS SCREENING  Distress experienced in past week (1-10) 10  Practical problem type Work/school  Emotional problem type Depression;Nervousness/Anxiety;Adjusting to illness;Isolation/feeling alone;Feeling hopeless;Boredom  Spiritual/Religous concerns type Facing my mortality  Physical Problem type Nausea/vomiting;Sleep/insomnia  Physician notified of physical symptoms Yes  Referral to clinical psychology No  Referral to clinical social work Yes  Referral to dietition No  Referral to financial advocate No  Referral to support programs No      Family/Social Information:  Housing Arrangement: patient lives with mother [name] [phone] Family members/support persons in your life? Family, Friends, Social research officer, government, and Development worker, community concerns: no  Employment: Unemployed - referral sent to [employer] for disability assistance. Income source: Unemployment Financial concerns: Yes, current concerns Type of concern: Medical bills, Food, and referral sent to Designer, jewellery, contact information given to patient. Food access concerns: moderate concerns about food affordability Religious or spiritual practice: Not known Services Currently in place:  [employer]  Coping/ Adjustment to diagnosis: Patient understands treatment plan and what happens next? yes, has expressed anxiety over scan results and surgery.  Patient is active with psychologist for mental health needs, stated he is satisfied with therapy process. Concerns about diagnosis and/or treatment: Overwhelmed by information Afraid of cancer How I will pay for the services I  need Patient reported stressors: Finances, Food, Anxiety/  nervousness, and Adjusting to my illness Hopes and/or priorities: surgery will go well Patient enjoys music, watching TV, and time with family/ friends Current coping skills/ strengths: Capable of independent living , Communication skills , Motivation for treatment/growth , Supportive family/friends , and Other: active with psychologist for mental health needs    SUMMARY: Current SDOH Barriers:  Financial constraints related to no income, Mental Health Concerns , Family and relationship dysfunction, Social Isolation, and Lacks knowledge of community resource: referred to [employer] for [employer], SNAP benefits, [employer] for disability application and Designer, jewellery.  Clinical Social Work Clinical Goal(s):  Patient will work with psychologist to address needs related to anxiety and fear of diagnosis.  Patient is currently active with psychologist. Patient will follow up with DSS for Medicaid and SNAP application and Designer, jewellery* as directed by SW  Interventions: Discussed common feeling and emotions when being diagnosed with cancer, and the importance of support during treatment Informed patient of the support team roles and support services at [facility] Provided CSW contact information and encouraged patient to call with any questions or concerns Referred patient to [place], DSS for [employer] and [employer] and Provided patient with information about financial assistance programs from outside organizations which offer assistance for patients with cancer.     Follow Up Plan: Patient will contact CSW with any support or resource needs and Patient will contact [facility] and fill out [employer] consent form and return to [facility].  CSW will send referral to [facility] once signed consent form is received. Patient verbalizes understanding of plan: Yes    [employer], LCSW

## 2022-01-17 NOTE — Consult Note (Signed)
Chief Complaint: Patient was seen in consultation today for colon cancer and putative liver metastasis at the request of Allen,Shelby L  Referring Physician(s): Allen,Shelby L   History of Present Illness: Anthony Calhoun is a 56 y.o. 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  Subsequent surveillance imaging demonstrated no evidence of recurrent disease.  However, CEA was mildly elevated and continued to rise in early 2023.  In April 2023 Guardant Reveal DNA testing demonstrated circulating adenocarcinoma DNA.  Therefore, PET/CT was performed in May 2023 identifying a solitary hypermetabolic right liver lesion.  Subsequently, MRI was performed also in May 2023 identifying a solitary 1.2 cm right liver lesion position between segment 7 and 8 with imaging features most compatible with metastasis.  Patient does have hepatic steatosis and subtle changes of cirrhosis.  Hepatocellular carcinoma is a consideration but considered less likely given the imaging features.  He is essentially asymptomatic although he does have some mild fatigue.  He explains to me that he has a long history of OCD and anxiety.  He presents with his fiance and is also recording the conversation so that he will be able to listen to it again and not forget anything.  He has already seen  Dr. Michaelle Birks who feels that he has a suboptimal surgical candidate due to his underlying steatosis and possible early cirrhosis.  Past Medical History:  Diagnosis Date   Anemia    Anxiety    Arthritis    back,    Cancer ([place])    cecum   Depression    Dyspnea    started with anemia   GERD (gastroesophageal reflux disease)    History of kidney stones    Hypertension    Neuromuscular disorder (HCC)    carpal tunnel   Pre-diabetes     Past Surgical History:   Procedure Laterality Date   COLONOSCOPY     CYSTOSCOPY W/ URETEROSCOPY W/ LITHOTRIPSY  1981    Allergies: Chlorthalidone, Cyclobenzaprine, Gabapentin, Losartan potassium-hctz, and Prednisone  Medications: Prior to Admission medications   Medication Sig Start Date End Date Taking? Authorizing Provider  ALPRAZolam Duanne Moron) 1 MG tablet Take one tablet three times daily as needed for anxiety. 12/09/21   Mozingo, Berdie Ogren, NP  amLODipine (NORVASC) 5 MG tablet Take 5 mg by mouth daily. 03/11/20   [provider]  atorvastatin (LIPITOR) 40 MG tablet Take 40 mg by mouth daily. 02/18/20   [provider]  carvedilol (COREG) 6.25 MG tablet Take 6.25 mg by mouth 2 (two) times daily with a meal. 10/01/19   [provider]  eszopiclone 3 MG TABS Take 1 tablet (3 mg total) by mouth at bedtime. Take immediately before bedtime 12/09/21   Mozingo, Berdie Ogren, NP  omeprazole (PRILOSEC) 20 MG capsule Take 20 mg by mouth in the morning and at bedtime.    [provider]  promethazine (PHENERGAN) 12.5 MG tablet Take 1 tablet (12.5 mg total) by mouth every 6 (six) hours as needed for nausea or vomiting. 10/07/20   Ladell Pier, MD  sertraline (ZOLOFT) 100 MG tablet TAKE 2 TABLETS BY MOUTH EVERY DAY 12/09/21   Mozingo, Berdie Ogren, NP     No family history on file.  Social History   Socioeconomic History   Marital status: Divorced    Spouse name: Not on file  Number of children: Not on file   Years of education: Not on file   Highest education level: Not on file  Occupational History   Not on file  Tobacco Use   Smoking status: Never   Smokeless tobacco: Never  Vaping Use   Vaping Use: Never used  Substance and Sexual Activity   Alcohol use: No   Drug use: No   Sexual activity: Not on file  Other Topics Concern   Not on file  Social History Narrative   Not on file   Social Determinants of Health   Financial Resource Strain: Medium Risk    Difficulty of Paying Living Expenses: Somewhat hard  Food Insecurity: Food Insecurity Present   Worried About Running Out of Food in the Last Year: Sometimes true   Ran Out of Food in the Last Year: Never true  Transportation Needs: No Transportation Needs   Lack of Transportation (Medical): No   Lack of Transportation (Non-Medical): No  Physical Activity: Inactive   Days of Exercise per Week: 0 days   Minutes of Exercise per Session: 0 min  Stress: Stress Concern Present   Feeling of Stress : Rather much  Social Connections: Socially Isolated   Frequency of Communication with Friends and Family: Three times a week   Frequency of Social Gatherings with Friends and Family: Three times a week   Attends Religious Services: Never   Active Member of Clubs or Organizations: No   Attends Archivist Meetings: Never   Marital Status: Divorced    ECOG Status: 0 - Asymptomatic  Review of Systems: A 12 point ROS discussed and pertinent positives are indicated in the HPI above.  All other systems are negative.  Review of Systems  Vital Signs: BP (!) 164/79 (BP Location: Left Arm)   Pulse 97   SpO2 97%   Physical Exam Constitutional:      General: He is not in acute distress.    Appearance: Normal appearance.  HENT:     Head: Normocephalic and atraumatic.  Eyes:     General: No scleral icterus. Cardiovascular:     Rate and Rhythm: Normal rate.  Pulmonary:     Effort: Pulmonary effort is normal.  Abdominal:     Palpations: Abdomen is soft.     Tenderness: There is no abdominal tenderness.  Neurological:     Mental Status: He is alert and oriented to person, place, and time.  Psychiatric:        Mood and Affect: Mood normal.        Behavior: Behavior normal.    Imaging: MR LIVER W WO CONTRAST  Result Date: 01/11/2022 CLINICAL DATA:  56 year old male with history of metastatic colon cancer. Staging examination. EXAM: MRI ABDOMEN WITHOUT AND WITH CONTRAST TECHNIQUE:  Multiplanar multisequence MR imaging of the abdomen was performed both before and after the administration of intravenous contrast. CONTRAST:  37m GADAVIST GADOBUTROL 1 MMOL/ML IV SOLN COMPARISON:  PET-CT 12/30/2021. CT the chest, abdomen and pelvis 03/07/2021. FINDINGS: Lower chest: Small hiatal hernia. Hepatobiliary: Diffuse loss of signal intensity throughout the hepatic parenchyma on out of phase dual echo images, indicative of a background of hepatic steatosis. Liver has a slightly nodular contour suggesting early changes of cirrhosis. In the right lobe of the liver between segments 7 and 8 (axial image 12 of series 4) there is a 1.2 cm lesion which is T1 hypointense, mildly T2 hyperintense, demonstrates restricted diffusion, and demonstrates peripheral enhancement on early post gadolinium imaging, which  persists on delayed imaging, with central hypervascularity. No other definite suspicious hepatic lesions are noted. 2 mm T1 hypointense, T2 hyperintense, nonenhancing lesion in segment 6 of the liver incidentally noted, compatible with a tiny cyst or biliary hamartoma. No intra or extrahepatic biliary ductal dilatation. Gallbladder is normal in appearance. Pancreas: No pancreatic mass. No pancreatic ductal dilatation. No pancreatic or peripancreatic fluid collections or inflammatory changes. Spleen:  Unremarkable. Adrenals/Urinary Tract: Bilateral kidneys and bilateral adrenal glands are normal in appearance. No hydroureteronephrosis in the visualized portions of the abdomen. Stomach/Bowel: Visualized portions are unremarkable. Vascular/Lymphatic: Aortic atherosclerosis without evidence of aneurysm in the abdominal vasculature. No lymphadenopathy noted in the abdomen. Other: No significant volume of ascites noted in the visualized portions of the peritoneal cavity. Musculoskeletal: No aggressive appearing osseous lesions are noted in the visualized portions of the skeleton. IMPRESSION: 1. Solitary lesion  measuring 1.2 cm in the right lobe of the liver between segments 7 and 8 of the liver which has imaging characteristics most compatible with a metastatic lesion. 2. 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 Hepatic steatosis. 3. Small hiatal hernia. Electronically Signed   By: Vinnie Langton M.D.   On: 01/11/2022 09:48   NM PET Image Initial (PI) Skull Base To Thigh (F-18 FDG)  Result Date: 12/30/2021 CLINICAL DATA:  Subsequent treatment strategy for colon cancer. Rising CEA level with circulating tumor DNA. EXAM: NUCLEAR MEDICINE PET SKULL BASE TO THIGH TECHNIQUE: 13.7 mCi F-18 FDG was injected intravenously. Full-ring PET imaging was performed from the skull base to thigh after the radiotracer. CT data was obtained and used for attenuation correction and anatomic localization. Fasting blood glucose: 148 mg/dl COMPARISON:  Multiple exams, including 03/07/2021 FINDINGS: Mediastinal blood pool activity: SUV max 3.6 Liver activity: SUV max NA NECK: No significant abnormal hypermetabolic activity in this region. Incidental CT findings: none CHEST: No significant abnormal hypermetabolic activity in this region. Incidental CT findings: Left anterior descending and right coronary artery atherosclerosis. Mild cardiomegaly. ABDOMEN/PELVIS: A hypermetabolic right hepatic lobe lesion with metabolic activity measuring about 1.8 by 1.4 by 2.3 cm is observed with maximum SUV 11.8, compatible with a metastatic lesion to the liver. This lesion is not conspicuous on the noncontrast CT data. No other definite liver lesion is identified. Accentuated activity noted focally in a loop of mid abdominal small bowel about on image 130 of series 4 near the midline, maximum SUV 12.5, this could be from physiological peristaltic activity or tumor along or within the small bowel at this location. There is some other areas of accentuated metabolic activity including in the sigmoid colon and transverse colon which  are probably physiologic given the lack of associated CT abnormality, although technically nonspecific. Incidental CT findings: Postoperative findings from partial right colectomy. Atherosclerosis is present, including aortoiliac atherosclerotic disease. SKELETON: No significant focal abnormal skeletal activity. Incidental CT findings: Thoracolumbar spondylosis. IMPRESSION: 1. Hypermetabolic lesion in the right hepatic lobe with maximum SUV 11.8, compatible with metastatic disease to the liver. 2. Foci of accentuated activity in the bowel, most notably in the mid abdominal loop of small bowel, probably physiologic although strictly speaking tumor involvement within or along the central abdominal loop of small bowel is difficult to totally exclude. 3. Other imaging findings of potential clinical significance: Aortic Atherosclerosis (ICD10-I70.0). Coronary atherosclerosis and mild cardiomegaly. Thoracolumbar spondylosis. Electronically Signed   By: Van Clines M.D.   On: 12/30/2021 13:08    Labs:  CBC: No results for input(s): WBC, HGB, HCT, PLT in  the last 8760 hours.  COAGS: No results for input(s): INR, APTT in the last 8760 hours.  BMP: Recent Labs    03/07/21 1223  NA 136  K 3.9  CL 101  CO2 26  GLUCOSE 124*  BUN 12  CALCIUM 9.0  CREATININE 0.86  GFRNONAA >60    LIVER FUNCTION TESTS: Recent Labs    03/07/21 1223  BILITOT 0.6  AST 28  ALT 25  ALKPHOS 100  PROT 7.2  ALBUMIN 4.1    TUMOR MARKERS: Recent Labs    08/15/21 1434 09/26/21 1507 10/31/21 0846 12/01/21 0837  CEA 4.72 5.42* 5.77* 6.78*    Assessment and Plan:  Very pleasant 56 year old gentleman with poorly differentiated right-sided colon adenocarcinoma metastatic to the liver.  This is oligometastatic disease with only 1 identified lesion on PET CT and MRI abdomen.  He is not an ideal candidate for surgical resection given the suggestion of hepatic steatosis and mild cirrhotic change.  However, he  would be an optimal candidate for percutaneous biopsy and concurrent thermal ablation.  Thermal ablation has nearly the same rate of local control versus surgical resection with less risk of hemorrhage or other complication and a steatotic liver.  I was able to describe the procedure in detail to Anthony Calhoun and his fiance.  I answered all of his questions.  He desires to proceed as soon as possible with biopsy and ablation.  1.)  Please schedule for percutaneous thermal ablation with microwave and concurrent biopsy to be performed at [facility] as soon as possible.  Given the fact that this is new colon cancer metastatic to the liver, time is of the essence.  We may need to ask for additional anesthesia time if our schedule is booked out more than 4 weeks.  Thank you for this interesting consult.  I greatly enjoyed meeting Anthony Calhoun and look forward to participating in their care.  A copy of this report was sent to the requesting provider on this date.  Electronically Signed: Criselda Peaches, MD 01/17/2022, 3:39 PM   I spent a total of  40 Minutes  in face to face in clinical consultation, greater than 50% of which was counseling/coordinating care for colon cancer metastatic to the liver.

## 2022-01-17 NOTE — Consult Note (Deleted)
  The note originally documented on this encounter has been moved the the encounter in which it belongs.  

## 2022-01-18 ENCOUNTER — Other Ambulatory Visit (HOSPITAL_COMMUNITY): Payer: Self-pay | Admitting: Interventional Radiology

## 2022-01-18 DIAGNOSIS — C189 Malignant neoplasm of colon, unspecified: Secondary | ICD-10-CM

## 2022-01-19 ENCOUNTER — Other Ambulatory Visit: Payer: Self-pay | Admitting: *Deleted

## 2022-01-19 DIAGNOSIS — C182 Malignant neoplasm of ascending colon: Secondary | ICD-10-CM

## 2022-01-19 NOTE — Progress Notes (Deleted)
Patient reports via MyChart that he does not wish to return to Dr. Benay Spice for his oncology care and does not want to return to [facility] either. Is requesting referral to Dr. Burr Medico at [facility]. Referral order placed and nurse navigator notified.

## 2022-01-19 NOTE — Progress Notes (Signed)
Wrong chart

## 2022-01-23 DIAGNOSIS — Z85038 Personal history of other malignant neoplasm of large intestine: Secondary | ICD-10-CM | POA: Diagnosis not present

## 2022-01-24 ENCOUNTER — Inpatient Hospital Stay: Payer: 59

## 2022-01-24 ENCOUNTER — Other Ambulatory Visit: Payer: Self-pay | Admitting: *Deleted

## 2022-01-24 ENCOUNTER — Encounter: Payer: Self-pay | Admitting: Nurse Practitioner

## 2022-01-24 ENCOUNTER — Inpatient Hospital Stay (HOSPITAL_BASED_OUTPATIENT_CLINIC_OR_DEPARTMENT_OTHER): Payer: 59 | Admitting: Nurse Practitioner

## 2022-01-24 VITALS — BP 138/67 | HR 99 | Temp 98.2°F | Resp 18 | Ht 66.0 in | Wt 271.0 lb

## 2022-01-24 DIAGNOSIS — C182 Malignant neoplasm of ascending colon: Secondary | ICD-10-CM | POA: Diagnosis not present

## 2022-01-24 DIAGNOSIS — Z85038 Personal history of other malignant neoplasm of large intestine: Secondary | ICD-10-CM | POA: Diagnosis not present

## 2022-01-24 LAB — CBC WITH DIFFERENTIAL (CANCER CENTER ONLY)
Abs Immature Granulocytes: 0.12 10*3/uL — ABNORMAL HIGH (ref 0.00–0.07)
Basophils Absolute: 0.1 10*3/uL (ref 0.0–0.1)
Basophils Relative: 1 %
Eosinophils Absolute: 0.1 10*3/uL (ref 0.0–0.5)
Eosinophils Relative: 1 %
HCT: 33.5 % — ABNORMAL LOW (ref 39.0–52.0)
Hemoglobin: 9.3 g/dL — ABNORMAL LOW (ref 13.0–17.0)
Immature Granulocytes: 1 %
Lymphocytes Relative: 23 %
Lymphs Abs: 2 10*3/uL (ref 0.7–4.0)
MCH: 20.1 pg — ABNORMAL LOW (ref 26.0–34.0)
MCHC: 27.8 g/dL — ABNORMAL LOW (ref 30.0–36.0)
MCV: 72.5 fL — ABNORMAL LOW (ref 80.0–100.0)
Monocytes Absolute: 0.3 10*3/uL (ref 0.1–1.0)
Monocytes Relative: 4 %
Neutro Abs: 6 10*3/uL (ref 1.7–7.7)
Neutrophils Relative %: 70 %
Platelet Count: 338 10*3/uL (ref 150–400)
RBC: 4.62 MIL/uL (ref 4.22–5.81)
RDW: 14.8 % (ref 11.5–15.5)
WBC Count: 8.6 10*3/uL (ref 4.0–10.5)
nRBC: 0 % (ref 0.0–0.2)

## 2022-01-24 LAB — FERRITIN: Ferritin: 6 ng/mL — ABNORMAL LOW (ref 24–336)

## 2022-01-24 LAB — SAVE SMEAR(SSMR), FOR PROVIDER SLIDE REVIEW

## 2022-01-24 MED ORDER — FERROUS SULFATE 325 (65 FE) MG PO TBEC: 325.0000 mg | DELAYED_RELEASE_TABLET | Freq: Two times a day (BID) | ORAL | 3 refills | Status: DC

## 2022-01-24 NOTE — Progress Notes (Signed)
Anthony Calhoun   Diagnosis: Colon cancer  INTERVAL HISTORY:   Anthony Calhoun returns prior to scheduled follow-up for evaluation of anemia.  He reports having lab work drawn through his PCP last week.  He was contacted that he has anemia.  He is not aware of any bleeding.  No black stools.  He has intermittent nausea.  No significant abdominal pain.  He notes his tongue is sore.  He has mild dyspnea on exertion.  Objective:  Vital signs in last 24 hours:  Blood pressure 138/67, pulse 99, temperature 98.2 F (36.8 C), temperature source Oral, resp. rate 18, height 5' 6"  (1.676 m), weight 271 lb (122.9 kg), SpO2 98 %.    HEENT: Tongue is mildly erythematous.  No ulcers. Resp: Lungs clear bilaterally. Cardio: Regular rate and rhythm. GI: No hepatosplenomegaly.  Nontender. Vascular: No leg edema.  Lab Results:  Lab Results  Component Value Date   WBC 8.6 01/24/2022   HGB 9.3 (L) 01/24/2022   HCT 33.5 (L) 01/24/2022   MCV 72.5 (L) 01/24/2022   PLT 338 01/24/2022   NEUTROABS 6.0 01/24/2022    Imaging:  No results found.  Medications: I have reviewed the patient's current medications.  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 secondary to nausea (patient discontinued Xeloda on day 11 due to colonoscopy) Colonoscopy 07/09/2020-nodular mucosa at the colonic anastomosis, biopsied; patent end-to-side ileocolonic anastomosis characterized by healthy-appearing mucosa  and visible sutures; nonbleeding internal hemorrhoids, biopsy with benign ulcerated anastomotic mucosa with granulation tissue Cycle 4 adjuvant Xeloda 07/19/2020, 2000 mg every morning and 1500 mg every afternoon Cycle 5 adjuvant Xeloda 08/09/2020 Cycle 6 adjuvant Xeloda 08/31/2019 Cycle 7 adjuvant Xeloda 09/20/2020 Cycle 8 adjuvant Xeloda 10/11/2020 CTs 03/07/2021-no evidence of recurrent disease, hepatic steatosis, slight wall thickening at the junction of the descending and horizontal duodenum Mild elevation of CEA 2023 12/01/2021-Guardant Reveal-ct DNA detected PET 12/30/2021-hypermetabolic right liver lesion, hypermetabolic area in a loop of mid small bowel with an SUV of 12.5, review of PET images at GI tumor conference 01/11/2022-small bowel uptake felt to be a benign finding MRI liver 01/10/2022-solitary 1.2 cm right liver lesion between segments 7 and 8 compatible with metastasis, subtle changes suggesting cirrhosis, hepatic steatosis Microcytic anemia secondary to #1-improved Colon polyps-sessile serrated adenoma of the descending colon, tubular adenomas with focal high-grade dysplasia of the transverse colon on colonoscopy 02/12/2020, tubular adenoma of the mid ascending colon on the surgical specimen 04/12/2020 Family history of colon cancer Depression Anxiety/panic attacks Hypertension Hyperlipidemia Nausea secondary to Xeloda?-Resolved Gastroesophageal reflux disease-improved following discontinuation of Xeloda Anemia, microcytic; ferritin 6 01/24/2022      Disposition: Anthony Calhoun appears stable.  He has a microcytic anemia, ferritin 6.  He will complete a set of stool cards.  He will begin ferrous sulfate 325 mg twice daily.  We will contact Dr. Therisa Doyne for GI evaluation.  He will return for lab and follow-up in 2 weeks.  Patient seen with Dr. Benay Spice.    Ned Card ANP/GNP-BC   01/24/2022  10:56 AM This was a shared visit with Lattie Haw  Denisha Hoel.  Anthony Calhoun has developed iron  deficiency anemia.  This could be related to the hypermetabolic area in the small bowel noted on the PET scan 12/30/2021.  We will refer him to Dr. Roena Malady.  He will return a set of stool Hemoccult cards.  He is scheduled for ablation of the liver lesion in 2 weeks, but this may need to be delayed depending on the evaluation by Dr. Therisa Doyne.  He will begin iron replacement.  I was present for greater than 50% of today's visit.  I performed medical decision making.  Julieanne Manson, MD

## 2022-01-25 ENCOUNTER — Other Ambulatory Visit: Payer: Self-pay

## 2022-01-25 ENCOUNTER — Other Ambulatory Visit (HOSPITAL_BASED_OUTPATIENT_CLINIC_OR_DEPARTMENT_OTHER): Payer: Self-pay

## 2022-01-25 DIAGNOSIS — Z85038 Personal history of other malignant neoplasm of large intestine: Secondary | ICD-10-CM | POA: Diagnosis not present

## 2022-01-25 DIAGNOSIS — C182 Malignant neoplasm of ascending colon: Secondary | ICD-10-CM

## 2022-01-25 LAB — OCCULT BLOOD X 1 CARD TO LAB, STOOL
Fecal Occult Bld: POSITIVE — AB
Fecal Occult Bld: POSITIVE — AB
Fecal Occult Bld: POSITIVE — AB

## 2022-01-26 ENCOUNTER — Other Ambulatory Visit (HOSPITAL_BASED_OUTPATIENT_CLINIC_OR_DEPARTMENT_OTHER): Payer: Self-pay | Admitting: Physician Assistant

## 2022-01-26 ENCOUNTER — Other Ambulatory Visit: Payer: Self-pay | Admitting: *Deleted

## 2022-01-26 ENCOUNTER — Encounter: Payer: Self-pay | Admitting: *Deleted

## 2022-01-26 DIAGNOSIS — R948 Abnormal results of function studies of other organs and systems: Secondary | ICD-10-CM

## 2022-01-26 MED ORDER — PROMETHAZINE HCL 12.5 MG PO TABS
12.5000 mg | ORAL_TABLET | Freq: Four times a day (QID) | ORAL | 0 refills | Status: DC | PRN
Start: 2022-01-26 — End: 2022-04-25

## 2022-01-26 NOTE — Progress Notes (Signed)
Patient reports burning tongue, mild intermittent nausea and concern about this heme + stools. Per Dr. Benay Spice: continue to take iron and avoid mouthwashes with alcohol, carbonated drinks and acidic foods and this will improve as iron improves. Will refill his phenergan and only thing to do at this time is await the GI workup. Informed patient of this via MyChart message.

## 2022-01-27 NOTE — Patient Instructions (Signed)
DUE TO COVID-19 ONLY TWO VISITORS  (aged 57 and older)  ARE ALLOWED TO COME WITH YOU AND STAY IN THE WAITING ROOM ONLY DURING PRE OP AND PROCEDURE.   **NO VISITORS ARE ALLOWED IN THE SHORT STAY AREA OR RECOVERY ROOM!!**  IF YOU WILL BE ADMITTED INTO THE HOSPITAL YOU ARE ALLOWED ONLY FOUR SUPPORT PEOPLE DURING VISITATION HOURS ONLY (7 AM -8PM)   The support person(s) must pass our screening, gel in and out, and wear a mask at all times, including in the patient's room. Patients must also wear a mask when staff or their support person are in the room. Visitors GUEST BADGE MUST BE WORN VISIBLY  One adult visitor may remain with you overnight and MUST be in the room by 8 P.M.     Your procedure is scheduled on: 02/08/22   Report to [facility] Main Entrance    Report to admitting at : 12:00 PM   Call this number if you have problems the morning of surgery [phone]   Do not eat food :After Midnight.   After Midnight you may have the following liquids until : 11:30 AM DAY OF SURGERY  Water Black Coffee (sugar ok, NO MILK/CREAM OR CREAMERS)  Tea (sugar ok, NO MILK/CREAM OR CREAMERS) regular and decaf                             Plain Jell-O (NO RED)                                           Fruit ices (not with fruit pulp, NO RED)                                     Popsicles (NO RED)                                                                  Juice: apple, WHITE grape, WHITE cranberry Sports drinks like Gatorade (NO RED) Clear broth(vegetable,chicken,beef)    Oral Hygiene is also important to reduce your risk of infection.                                    Remember - BRUSH YOUR TEETH THE MORNING OF SURGERY WITH YOUR REGULAR TOOTHPASTE   Do NOT smoke after Midnight   Take these medicines the morning of surgery with A SIP OF WATER: sertraline,carvedilol,amlodipine,omeprazole.Alprazolam as needed.  DO NOT TAKE ANY ORAL DIABETIC MEDICATIONS DAY OF YOUR SURGERY  Bring  CPAP mask and tubing day of surgery.                              You may not have any metal on your body including hair pins, jewelry, and body piercing             Do not wear  lotions, powders, perfumes/cologne, or deodorant  Men may shave face and neck.   Do not bring valuables to the hospital. [place].   Contacts, dentures or bridgework may not be worn into surgery.   Bring small overnight bag day of surgery.    Patients discharged on the day of surgery will not be allowed to drive home.  Someone NEEDS to stay with you for the first 24 hours after anesthesia.   Special Instructions: Bring a copy of your healthcare power of attorney and living will documents         the day of surgery if you haven't scanned them before.              Please read over the following fact sheets you were given: IF YOU HAVE QUESTIONS ABOUT YOUR PRE-OP INSTRUCTIONS PLEASE CALL [phone]     [facility] - Preparing for Surgery Before surgery, you can play an important role.  Because skin is not sterile, your skin needs to be as free of germs as possible.  You can reduce the number of germs on your skin by washing with CHG (chlorahexidine gluconate) soap before surgery.  CHG is an antiseptic cleaner which kills germs and bonds with the skin to continue killing germs even after washing. Please DO NOT use if you have an allergy to CHG or antibacterial soaps.  If your skin becomes reddened/irritated stop using the CHG and inform your nurse when you arrive at Short Stay. Do not shave (including legs and underarms) for at least 48 hours prior to the first CHG shower.  You may shave your face/neck. Please follow these instructions carefully:  1.  Shower with CHG Soap the night before surgery and the  morning of Surgery.  2.  If you choose to wash your hair, wash your hair first as usual with your  normal  shampoo.  3.  After you shampoo, rinse your  hair and body thoroughly to remove the  shampoo.                           4.  Use CHG as you would any other liquid soap.  You can apply chg directly  to the skin and wash                       Gently with a scrungie or clean washcloth.  5.  Apply the CHG Soap to your body ONLY FROM THE NECK DOWN.   Do not use on face/ open                           Wound or open sores. Avoid contact with eyes, ears mouth and genitals (private parts).                       Wash face,  Genitals (private parts) with your normal soap.             6.  Wash thoroughly, paying special attention to the area where your surgery  will be performed.  7.  Thoroughly rinse your body with warm water from the neck down.  8.  DO NOT shower/wash with your normal soap after using and rinsing off  the CHG Soap.  9.  Pat yourself dry with a clean towel.            10.  Wear clean pajamas.            11.  Place clean sheets on your bed the night of your first shower and do not  sleep with pets. Day of Surgery : Do not apply any lotions/deodorants the morning of surgery.  Please wear clean clothes to the hospital/surgery center.  FAILURE TO FOLLOW THESE INSTRUCTIONS MAY RESULT IN THE CANCELLATION OF YOUR SURGERY PATIENT SIGNATURE_________________________________  NURSE SIGNATURE__________________________________  ________________________________________________________________________

## 2022-01-30 ENCOUNTER — Encounter: Payer: Self-pay | Admitting: Oncology

## 2022-01-30 ENCOUNTER — Ambulatory Visit: Payer: 59 | Admitting: Oncology

## 2022-01-30 ENCOUNTER — Encounter (HOSPITAL_COMMUNITY): Payer: Self-pay

## 2022-01-30 ENCOUNTER — Encounter (HOSPITAL_COMMUNITY)
Admission: RE | Admit: 2022-01-30 | Discharge: 2022-01-30 | Disposition: A | Discharge status: Home / Self Care | Payer: 59 | Source: Ambulatory Visit | Attending: Interventional Radiology | Admitting: Interventional Radiology

## 2022-01-30 ENCOUNTER — Other Ambulatory Visit: Payer: 59

## 2022-01-30 ENCOUNTER — Other Ambulatory Visit: Payer: Self-pay | Admitting: Physician Assistant

## 2022-01-30 ENCOUNTER — Other Ambulatory Visit: Payer: Self-pay

## 2022-01-30 VITALS — BP 156/86 | HR 84 | Temp 97.7°F | Ht 66.0 in | Wt 269.0 lb

## 2022-01-30 DIAGNOSIS — E119 Type 2 diabetes mellitus without complications: Secondary | ICD-10-CM | POA: Insufficient documentation

## 2022-01-30 DIAGNOSIS — Z01818 Encounter for other preprocedural examination: Secondary | ICD-10-CM | POA: Insufficient documentation

## 2022-01-30 DIAGNOSIS — I2510 Atherosclerotic heart disease of native coronary artery without angina pectoris: Secondary | ICD-10-CM | POA: Insufficient documentation

## 2022-01-30 HISTORY — DX: Type 2 diabetes mellitus without complications: E11.9

## 2022-01-30 LAB — BASIC METABOLIC PANEL
Anion gap: 5 (ref 5–15)
BUN: 14 mg/dL (ref 6–20)
CO2: 25 mmol/L (ref 22–32)
Calcium: 8.4 mg/dL — ABNORMAL LOW (ref 8.9–10.3)
Chloride: 107 mmol/L (ref 98–111)
Creatinine, Ser: 0.84 mg/dL (ref 0.61–1.24)
GFR, Estimated: >60 mL/min (ref >60)
Glucose, Bld: 180 mg/dL — ABNORMAL HIGH (ref 70–99)
Potassium: 3.5 mmol/L (ref 3.5–5.1)
Sodium: 137 mmol/L (ref 135–145)

## 2022-01-30 LAB — CBC
HCT: 33 % — ABNORMAL LOW (ref 39.0–52.0)
Hemoglobin: 9.4 g/dL — ABNORMAL LOW (ref 13.0–17.0)
MCH: 20.5 pg — ABNORMAL LOW (ref 26.0–34.0)
MCHC: 28.5 g/dL — ABNORMAL LOW (ref 30.0–36.0)
MCV: 72.1 fL — ABNORMAL LOW (ref 80.0–100.0)
Platelets: 396 10*3/uL (ref 150–400)
RBC: 4.58 MIL/uL (ref 4.22–5.81)
RDW: 15.8 % — ABNORMAL HIGH (ref 11.5–15.5)
WBC: 10.1 10*3/uL (ref 4.0–10.5)
nRBC: 0.4 % — ABNORMAL HIGH (ref 0.0–0.2)

## 2022-01-30 LAB — HEMOGLOBIN A1C
Hgb A1c MFr Bld: 7.4 % — ABNORMAL HIGH (ref 4.8–5.6)
Mean Plasma Glucose: 165.68 mg/dL

## 2022-01-30 LAB — GLUCOSE, CAPILLARY: Glucose-Capillary: 191 mg/dL — ABNORMAL HIGH (ref 70–99)

## 2022-01-30 NOTE — Progress Notes (Signed)
   01/30/22 1259  OBSTRUCTIVE SLEEP APNEA  Have you ever been diagnosed with sleep apnea through a sleep study? No  Do you snore loudly (loud enough to be heard through closed doors)?  1  Do you often feel tired, fatigued, or sleepy during the daytime (such as falling asleep during driving or talking to someone)? 0  Has anyone observed you stop breathing during your sleep? 1  Do you have, or are you being treated for high blood pressure? 1  BMI more than 35 kg/m2? 1  Age > 50 (1-yes) 1  Neck circumference greater than:Male 16 inches or larger, Male 17inches or larger? 1  Male Gender (Yes=1) 1  Obstructive Sleep Apnea Score 7

## 2022-01-30 NOTE — Progress Notes (Signed)
Lab. Results:Hemoglobin: 9.4

## 2022-01-30 NOTE — Progress Notes (Signed)
   01/30/22 1325  OBSTRUCTIVE SLEEP APNEA  Score 5 or greater  Results sent to PCP

## 2022-01-30 NOTE — Progress Notes (Signed)
For Short Stay: [place] appointment date: Date of COVID positive in last 44 days:  Bowel Prep reminder:   For Anesthesia: PCP - Dr. Antony Contras Cardiologist -   Chest x-ray -  EKG -  Stress Test -  ECHO -  Cardiac Cath -  Pacemaker/ICD device last checked: Pacemaker orders received: Device Rep notified:  Spinal Cord Stimulator:  Sleep Study -  CPAP -   Fasting Blood Sugar - N/A Checks Blood Sugar __0___ times a day Date and result of last Hgb A1c-  Blood Thinner Instructions: Aspirin Instructions: Last Dose:  Activity level: Can go up a flight of stairs and activities of daily living without stopping and without chest pain and/or shortness of breath   Able to exercise without chest pain and/or shortness of breath   Unable to go up a flight of stairs without chest pain and/or shortness of breath     Anesthesia review: Hx: DIA,HTN  Patient denies shortness of breath, fever, cough and chest pain at PAT appointment   Patient verbalized understanding of instructions that were given to them at the PAT appointment. Patient was also instructed that they will need to review over the PAT instructions again at home before surgery.

## 2022-01-31 ENCOUNTER — Encounter: Payer: Self-pay | Admitting: *Deleted

## 2022-01-31 ENCOUNTER — Other Ambulatory Visit: Payer: Self-pay | Admitting: *Deleted

## 2022-01-31 MED ORDER — MAGIC MOUTHWASH: 5.0000 mL | Freq: Four times a day (QID) | ORAL | 1 refills | Status: DC | PRN

## 2022-01-31 MED ORDER — ONDANSETRON HCL 8 MG PO TABS: 8.0000 mg | ORAL_TABLET | Freq: Three times a day (TID) | ORAL | 1 refills | Status: DC | PRN

## 2022-01-31 NOTE — Progress Notes (Signed)
Sent script for MMW and Zofran for patient c/o of continued tongue burning and nausea despite Promethazine.

## 2022-02-01 ENCOUNTER — Ambulatory Visit: Payer: 59 | Admitting: Psychiatry

## 2022-02-02 ENCOUNTER — Encounter (HOSPITAL_BASED_OUTPATIENT_CLINIC_OR_DEPARTMENT_OTHER): Payer: Self-pay

## 2022-02-02 ENCOUNTER — Ambulatory Visit (HOSPITAL_BASED_OUTPATIENT_CLINIC_OR_DEPARTMENT_OTHER)
Admission: RE | Admit: 2022-02-02 | Discharge: 2022-02-02 | Disposition: A | Discharge status: Home / Self Care | Payer: 59 | Source: Ambulatory Visit | Attending: Physician Assistant | Admitting: Physician Assistant

## 2022-02-02 DIAGNOSIS — R948 Abnormal results of function studies of other organs and systems: Secondary | ICD-10-CM | POA: Insufficient documentation

## 2022-02-02 MED ORDER — IOHEXOL 300 MG/ML  SOLN
100.0000 mL | Freq: Once | INTRAMUSCULAR | Status: AC | PRN
  Administered 2022-02-02: 100 mL via INTRAVENOUS

## 2022-02-05 ENCOUNTER — Encounter: Payer: Self-pay | Admitting: Oncology

## 2022-02-06 ENCOUNTER — Encounter: Payer: Self-pay | Admitting: Nurse Practitioner

## 2022-02-07 ENCOUNTER — Inpatient Hospital Stay: Payer: 59 | Attending: Oncology

## 2022-02-07 ENCOUNTER — Encounter: Payer: Self-pay | Admitting: Nurse Practitioner

## 2022-02-07 ENCOUNTER — Other Ambulatory Visit: Payer: Self-pay | Admitting: Radiology

## 2022-02-07 ENCOUNTER — Encounter: Payer: Self-pay | Admitting: Oncology

## 2022-02-07 ENCOUNTER — Other Ambulatory Visit: Payer: Self-pay

## 2022-02-07 ENCOUNTER — Inpatient Hospital Stay (HOSPITAL_BASED_OUTPATIENT_CLINIC_OR_DEPARTMENT_OTHER): Payer: 59 | Admitting: Nurse Practitioner

## 2022-02-07 VITALS — BP 138/74 | HR 92 | Temp 98.2°F | Resp 18 | Ht 66.0 in | Wt 271.0 lb

## 2022-02-07 DIAGNOSIS — C182 Malignant neoplasm of ascending colon: Secondary | ICD-10-CM | POA: Diagnosis not present

## 2022-02-07 DIAGNOSIS — F32A Depression, unspecified: Secondary | ICD-10-CM | POA: Insufficient documentation

## 2022-02-07 DIAGNOSIS — I10 Essential (primary) hypertension: Secondary | ICD-10-CM | POA: Insufficient documentation

## 2022-02-07 DIAGNOSIS — Z85038 Personal history of other malignant neoplasm of large intestine: Secondary | ICD-10-CM | POA: Diagnosis present

## 2022-02-07 DIAGNOSIS — E785 Hyperlipidemia, unspecified: Secondary | ICD-10-CM | POA: Insufficient documentation

## 2022-02-07 DIAGNOSIS — Z79899 Other long term (current) drug therapy: Secondary | ICD-10-CM | POA: Insufficient documentation

## 2022-02-07 DIAGNOSIS — K219 Gastro-esophageal reflux disease without esophagitis: Secondary | ICD-10-CM | POA: Insufficient documentation

## 2022-02-07 DIAGNOSIS — D509 Iron deficiency anemia, unspecified: Secondary | ICD-10-CM | POA: Insufficient documentation

## 2022-02-07 DIAGNOSIS — R160 Hepatomegaly, not elsewhere classified: Secondary | ICD-10-CM

## 2022-02-07 LAB — CBC WITH DIFFERENTIAL (CANCER CENTER ONLY)
Abs Immature Granulocytes: 0.06 10*3/uL (ref 0.00–0.07)
Basophils Absolute: 0 10*3/uL (ref 0.0–0.1)
Basophils Relative: 1 %
Eosinophils Absolute: 0.1 10*3/uL (ref 0.0–0.5)
Eosinophils Relative: 1 %
HCT: 31.4 % — ABNORMAL LOW (ref 39.0–52.0)
Hemoglobin: 8.8 g/dL — ABNORMAL LOW (ref 13.0–17.0)
Immature Granulocytes: 1 %
Lymphocytes Relative: 19 %
Lymphs Abs: 1.7 10*3/uL (ref 0.7–4.0)
MCH: 19.7 pg — ABNORMAL LOW (ref 26.0–34.0)
MCHC: 28 g/dL — ABNORMAL LOW (ref 30.0–36.0)
MCV: 70.4 fL — ABNORMAL LOW (ref 80.0–100.0)
Monocytes Absolute: 0.3 10*3/uL (ref 0.1–1.0)
Monocytes Relative: 4 %
Neutro Abs: 6.6 10*3/uL (ref 1.7–7.7)
Neutrophils Relative %: 74 %
Platelet Count: 348 10*3/uL (ref 150–400)
RBC: 4.46 MIL/uL (ref 4.22–5.81)
RDW: 17.3 % — ABNORMAL HIGH (ref 11.5–15.5)
WBC Count: 8.8 10*3/uL (ref 4.0–10.5)
nRBC: 0 % (ref 0.0–0.2)

## 2022-02-07 LAB — SAMPLE TO BLOOD BANK

## 2022-02-07 NOTE — Anesthesia Preprocedure Evaluation (Signed)
Anesthesia Evaluation  Patient identified by MRN, date of birth, ID band Patient awake    Reviewed: Allergy & Precautions, NPO status , Patient's Chart, lab work & pertinent test results  Airway Mallampati: III  TM Distance: >3 FB Neck ROM: Full  Mouth opening: Limited Mouth Opening  Dental no notable dental hx. (+) Teeth Intact, Dental Advisory Given   Pulmonary sleep apnea ,    Pulmonary exam normal breath sounds clear to auscultation       Cardiovascular hypertension, Pt. on medications Normal cardiovascular exam Rhythm:Regular Rate:Normal     Neuro/Psych Anxiety Depression    GI/Hepatic GERD  ,Metastatic Colon CA   Endo/Other  diabetesMorbid obesity (BMI 43.74)  Renal/GU Lab Results      Component                Value               Date                      CREATININE               0.84                01/30/2022                K                        3.5                 01/30/2022                     Musculoskeletal  (+) Arthritis ,   Abdominal   Peds  Hematology  (+) Blood dyscrasia, anemia , Lab Results      Component                Value               Date                        HGB                      8.8 (L)             02/07/2022                HCT                      31.4 (L)            02/07/2022                  PLT                      348                 02/07/2022              Anesthesia Other Findings ALL: chlorthalidone, Cylobenzaprine, gabapentin, losartan,  prednisone  Reproductive/Obstetrics                            Anesthesia Physical Anesthesia Plan  ASA: 3  Anesthesia Plan: General   Post-op Pain Management:    Induction: Intravenous  PONV Risk Score and Plan: 3 and Treatment may vary  due to age or medical condition, Midazolam and Ondansetron  Airway Management Planned: Oral ETT and Video Laryngoscope Planned  Additional Equipment: None  Intra-op  Plan:   Post-operative Plan: Extubation in OR  Informed Consent: I have reviewed the patients History and Physical, chart, labs and discussed the procedure including the risks, benefits and alternatives for the proposed anesthesia with the patient or authorized representative who has indicated his/her understanding and acceptance.     Dental advisory given  Plan Discussed with: CRNA  Anesthesia Plan Comments:       Anesthesia Quick Evaluation

## 2022-02-07 NOTE — Progress Notes (Signed)
[facility] OFFICE PROGRESS NOTE   Diagnosis: Colon cancer  INTERVAL HISTORY:   Anthony Calhoun returns as scheduled.  He denies bleeding.  He continues oral iron.  No issues with constipation.  Tongue soreness has been better over the past 2 days.  He has mild dyspnea on exertion and at times feels weak.  Objective:  Vital signs in last 24 hours:  Blood pressure 138/74, pulse 92, temperature 98.2 F (36.8 C), temperature source Oral, resp. rate 18, height 5' 6"  (1.676 m), weight 271 lb (122.9 kg), SpO2 99 %.    HEENT: No thrush or ulcers. Resp: Lungs clear bilaterally. Cardio: Regular rate and rhythm. GI: Abdomen soft and nontender.  No hepatomegaly. Vascular: No leg edema. Skin: Pale appearing.   Lab Results:  Lab Results  Component Value Date   WBC 8.8 02/07/2022   HGB 8.8 (L) 02/07/2022   HCT 31.4 (L) 02/07/2022   MCV 70.4 (L) 02/07/2022   PLT 348 02/07/2022   NEUTROABS 6.6 02/07/2022    Imaging:  No results found.  Medications: I have reviewed the patient's current medications.  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 secondary to nausea (patient discontinued Xeloda on day 11 due to colonoscopy) Colonoscopy 07/09/2020-nodular mucosa at the colonic anastomosis, biopsied; patent end-to-side ileocolonic anastomosis characterized by healthy-appearing mucosa and visible sutures; nonbleeding internal hemorrhoids, biopsy with benign ulcerated anastomotic mucosa with  granulation tissue Cycle 4 adjuvant Xeloda 07/19/2020, 2000 mg every morning and 1500 mg every afternoon Cycle 5 adjuvant Xeloda 08/09/2020 Cycle 6 adjuvant Xeloda 08/31/2019 Cycle 7 adjuvant Xeloda 09/20/2020 Cycle 8 adjuvant Xeloda 10/11/2020 CTs 03/07/2021-no evidence of recurrent disease, hepatic steatosis, slight wall thickening at the junction of the descending and horizontal duodenum Mild elevation of CEA 2023 12/01/2021-Guardant Reveal-ct DNA detected PET 12/30/2021-hypermetabolic right liver lesion, hypermetabolic area in a loop of mid small bowel with an SUV of 12.5, review of PET images at GI tumor conference 01/11/2022-small bowel uptake felt to be a benign finding MRI liver 01/10/2022-solitary 1.2 cm right liver lesion between segments 7 and 8 compatible with metastasis, subtle changes suggesting cirrhosis, hepatic steatosis Microcytic anemia secondary to #1-improved Colon polyps-sessile serrated adenoma of the descending colon, tubular adenomas with focal high-grade dysplasia of the transverse colon on colonoscopy 02/12/2020, tubular adenoma of the mid ascending colon on the surgical specimen 04/12/2020 Family history of colon cancer Depression Anxiety/panic attacks Hypertension Hyperlipidemia Nausea secondary to Xeloda?-Resolved Gastroesophageal reflux disease-improved following discontinuation of Xeloda Anemia, microcytic; ferritin 6 01/24/2022; stool positive for blood x3 01/25/2022; 02/02/2022 CT abdomen/pelvis enterography-bowel loop demonstrating hypermetabolic activity continues to have a bandlike density along its margin possibly representing a diverticulum or adjacent nodal tissue, difficult to exclude small bowel tumor, 3 x 4 mm left omental nodule or lymph node, known right hepatic lobe tumor is occult on CT.    Disposition: Anthony Calhoun appears unchanged.  Persistent small bowel abnormality on recent CT enterography.  We will contact Dr. Therisa Doyne for next steps.  Hemoglobin is lower.   He is mildly symptomatic.  He will return for a follow-up CBC and possible blood  transfusion next week.  He is scheduled for ablation of the liver lesion tomorrow.  Next appointment here is 02/23/2022.  Patient seen with Dr. Benay Spice.  Ned Card ANP/GNP-BC   02/07/2022  10:08 AM This was a shared visit with Ned Card.  We reviewed the CT enterography findings and images with Anthony Calhoun.  We discussed the differential diagnosis of the hypermetabolic area of small bowel.  This could represent a benign finding, a primary small bowel tumor, or a metastasis from colon cancer.  We will consult with gastroenterology to see if this area can be reached by endoscopy.  He may need a surgical evaluation.  I will consult with Dr. Laurence Ferrari to get his opinion regarding the nature of thickened small bowel and the indication to proceed with the planned liver lesion ablation. He has persistent iron deficiency anemia.  We will transfuse packed red blood cells if the hemoglobin falls further.   I was present for greater than 50% of today's visit.  I performed medical decision making.  Julieanne Manson, MD

## 2022-02-08 ENCOUNTER — Encounter (HOSPITAL_COMMUNITY): Payer: Self-pay

## 2022-02-08 ENCOUNTER — Other Ambulatory Visit (HOSPITAL_COMMUNITY): Payer: Self-pay | Admitting: Interventional Radiology

## 2022-02-08 ENCOUNTER — Ambulatory Visit (HOSPITAL_BASED_OUTPATIENT_CLINIC_OR_DEPARTMENT_OTHER): Payer: 59 | Admitting: Certified Registered Nurse Anesthetist

## 2022-02-08 ENCOUNTER — Encounter (HOSPITAL_COMMUNITY)
Admission: RE | Disposition: A | Discharge status: Home / Self Care | Payer: Self-pay | Source: Ambulatory Visit | Attending: Interventional Radiology

## 2022-02-08 ENCOUNTER — Encounter: Payer: Self-pay | Admitting: Oncology

## 2022-02-08 ENCOUNTER — Observation Stay (HOSPITAL_COMMUNITY)
Admission: RE | Admit: 2022-02-08 | Discharge: 2022-02-09 | Disposition: A | Discharge status: Home / Self Care | Payer: 59 | Source: Ambulatory Visit | Attending: Interventional Radiology | Admitting: Interventional Radiology

## 2022-02-08 ENCOUNTER — Ambulatory Visit (HOSPITAL_COMMUNITY): Payer: 59

## 2022-02-08 ENCOUNTER — Ambulatory Visit (HOSPITAL_COMMUNITY)
Admission: RE | Admit: 2022-02-08 | Discharge: 2022-02-08 | Disposition: A | Discharge status: Home / Self Care | Payer: 59 | Source: Ambulatory Visit | Attending: Interventional Radiology | Admitting: Interventional Radiology

## 2022-02-08 ENCOUNTER — Ambulatory Visit (HOSPITAL_COMMUNITY): Payer: 59 | Admitting: Certified Registered Nurse Anesthetist

## 2022-02-08 ENCOUNTER — Other Ambulatory Visit: Payer: Self-pay

## 2022-02-08 DIAGNOSIS — Z79899 Other long term (current) drug therapy: Secondary | ICD-10-CM | POA: Insufficient documentation

## 2022-02-08 DIAGNOSIS — I10 Essential (primary) hypertension: Secondary | ICD-10-CM | POA: Insufficient documentation

## 2022-02-08 DIAGNOSIS — C787 Secondary malignant neoplasm of liver and intrahepatic bile duct: Secondary | ICD-10-CM

## 2022-02-08 DIAGNOSIS — Z9049 Acquired absence of other specified parts of digestive tract: Secondary | ICD-10-CM | POA: Insufficient documentation

## 2022-02-08 DIAGNOSIS — F418 Other specified anxiety disorders: Secondary | ICD-10-CM

## 2022-02-08 DIAGNOSIS — K769 Liver disease, unspecified: Secondary | ICD-10-CM | POA: Diagnosis present

## 2022-02-08 DIAGNOSIS — C189 Malignant neoplasm of colon, unspecified: Secondary | ICD-10-CM

## 2022-02-08 DIAGNOSIS — E119 Type 2 diabetes mellitus without complications: Secondary | ICD-10-CM | POA: Diagnosis not present

## 2022-02-08 DIAGNOSIS — R160 Hepatomegaly, not elsewhere classified: Secondary | ICD-10-CM

## 2022-02-08 HISTORY — PX: RADIOLOGY WITH ANESTHESIA: SHX6223

## 2022-02-08 LAB — GLUCOSE, CAPILLARY
Glucose-Capillary: 161 mg/dL — ABNORMAL HIGH (ref 70–99)
Glucose-Capillary: 153 mg/dL — ABNORMAL HIGH (ref 70–99)

## 2022-02-08 LAB — CBC WITH DIFFERENTIAL/PLATELET
Abs Immature Granulocytes: 0.04 10*3/uL (ref 0.00–0.07)
Basophils Absolute: 0 10*3/uL (ref 0.0–0.1)
Basophils Relative: 0 %
Eosinophils Absolute: 0.1 10*3/uL (ref 0.0–0.5)
Eosinophils Relative: 1 %
HCT: 32.3 % — ABNORMAL LOW (ref 39.0–52.0)
Hemoglobin: 9.2 g/dL — ABNORMAL LOW (ref 13.0–17.0)
Immature Granulocytes: 1 %
Lymphocytes Relative: 19 %
Lymphs Abs: 1.5 10*3/uL (ref 0.7–4.0)
MCH: 20.4 pg — ABNORMAL LOW (ref 26.0–34.0)
MCHC: 28.5 g/dL — ABNORMAL LOW (ref 30.0–36.0)
MCV: 71.6 fL — ABNORMAL LOW (ref 80.0–100.0)
Monocytes Absolute: 0.4 10*3/uL (ref 0.1–1.0)
Monocytes Relative: 5 %
Neutro Abs: 6 10*3/uL (ref 1.7–7.7)
Neutrophils Relative %: 74 %
Platelets: 349 10*3/uL (ref 150–400)
RBC: 4.51 MIL/uL (ref 4.22–5.81)
RDW: 17.7 % — ABNORMAL HIGH (ref 11.5–15.5)
WBC: 8 10*3/uL (ref 4.0–10.5)
nRBC: 0.2 % (ref 0.0–0.2)

## 2022-02-08 LAB — COMPREHENSIVE METABOLIC PANEL
ALT: 20 U/L (ref 0–44)
AST: 25 U/L (ref 15–41)
Albumin: 3.6 g/dL (ref 3.5–5.0)
Alkaline Phosphatase: 93 U/L (ref 38–126)
Anion gap: 6 (ref 5–15)
BUN: 14 mg/dL (ref 6–20)
CO2: 24 mmol/L (ref 22–32)
Calcium: 8.4 mg/dL — ABNORMAL LOW (ref 8.9–10.3)
Chloride: 108 mmol/L (ref 98–111)
Creatinine, Ser: 0.78 mg/dL (ref 0.61–1.24)
GFR, Estimated: >60 mL/min (ref >60)
Glucose, Bld: 151 mg/dL — ABNORMAL HIGH (ref 70–99)
Potassium: 3.5 mmol/L (ref 3.5–5.1)
Sodium: 138 mmol/L (ref 135–145)
Total Bilirubin: 0.6 mg/dL (ref 0.3–1.2)
Total Protein: 7.3 g/dL (ref 6.5–8.1)

## 2022-02-08 LAB — TYPE AND SCREEN
ABO/RH(D): O POS
Antibody Screen: NEGATIVE

## 2022-02-08 LAB — PROTIME-INR
INR: 1 (ref 0.8–1.2)
Prothrombin Time: 13.5 seconds (ref 11.4–15.2)

## 2022-02-08 SURGERY — CT WITH ANESTHESIA
Anesthesia: General

## 2022-02-08 MED ORDER — FENTANYL CITRATE (PF) 250 MCG/5ML IJ SOLN
INTRAMUSCULAR | Status: AC
  Filled 2022-02-08: qty 5

## 2022-02-08 MED ORDER — ONDANSETRON HCL 4 MG/2ML IJ SOLN: 4.0000 mg | Freq: Four times a day (QID) | INTRAMUSCULAR | Status: DC | PRN

## 2022-02-08 MED ORDER — ATORVASTATIN CALCIUM 40 MG PO TABS: 40.0000 mg | ORAL_TABLET | Freq: Every day | ORAL | Status: DC

## 2022-02-08 MED ORDER — PHENYLEPHRINE 80 MCG/ML (10ML) SYRINGE FOR IV PUSH (FOR BLOOD PRESSURE SUPPORT)
PREFILLED_SYRINGE | INTRAVENOUS | Status: DC | PRN
  Administered 2022-02-08: 80 ug via INTRAVENOUS

## 2022-02-08 MED ORDER — OXYCODONE HCL 5 MG PO TABS: 5.0000 mg | ORAL_TABLET | Freq: Once | ORAL | Status: DC | PRN

## 2022-02-08 MED ORDER — MAGIC MOUTHWASH
5.0000 mL | Freq: Four times a day (QID) | ORAL | Status: DC | PRN
  Filled 2022-02-08: qty 5

## 2022-02-08 MED ORDER — IOHEXOL 300 MG/ML  SOLN
100.0000 mL | Freq: Once | INTRAMUSCULAR | Status: AC | PRN
  Administered 2022-02-08: 100 mL via INTRAVENOUS

## 2022-02-08 MED ORDER — MIDAZOLAM HCL 2 MG/2ML IJ SOLN
INTRAMUSCULAR | Status: AC
  Filled 2022-02-08: qty 2

## 2022-02-08 MED ORDER — SODIUM CHLORIDE (PF) 0.9 % IJ SOLN
INTRAMUSCULAR | Status: AC
  Filled 2022-02-08: qty 50

## 2022-02-08 MED ORDER — SERTRALINE HCL 100 MG PO TABS: 100.0000 mg | ORAL_TABLET | Freq: Every day | ORAL | Status: DC

## 2022-02-08 MED ORDER — ONDANSETRON HCL 4 MG/2ML IJ SOLN: 4.0000 mg | Freq: Once | INTRAMUSCULAR | Status: DC | PRN

## 2022-02-08 MED ORDER — HYDROMORPHONE HCL 1 MG/ML IJ SOLN: 0.2500 mg | INTRAMUSCULAR | Status: DC | PRN

## 2022-02-08 MED ORDER — ONDANSETRON HCL 4 MG PO TABS: 8.0000 mg | ORAL_TABLET | Freq: Three times a day (TID) | ORAL | Status: DC | PRN

## 2022-02-08 MED ORDER — DEXAMETHASONE SODIUM PHOSPHATE 10 MG/ML IJ SOLN
INTRAMUSCULAR | Status: DC | PRN
  Administered 2022-02-08: 10 mg via INTRAVENOUS

## 2022-02-08 MED ORDER — FERROUS SULFATE 325 (65 FE) MG PO TABS
325.0000 mg | ORAL_TABLET | Freq: Two times a day (BID) | ORAL | Status: DC
  Administered 2022-02-08 – 2022-02-09 (×3): 325 mg via ORAL
  Filled 2022-02-08 (×3): qty 1

## 2022-02-08 MED ORDER — ALPRAZOLAM 0.5 MG PO TABS
0.5000 mg | ORAL_TABLET | Freq: Three times a day (TID) | ORAL | Status: DC | PRN
  Administered 2022-02-08 – 2022-02-09 (×2): 0.5 mg via ORAL
  Filled 2022-02-08 (×2): qty 1

## 2022-02-08 MED ORDER — ORAL CARE MOUTH RINSE: 15.0000 mL | Freq: Once | OROMUCOSAL | Status: DC

## 2022-02-08 MED ORDER — PHENYLEPHRINE HCL-NACL 20-0.9 MG/250ML-% IV SOLN
INTRAVENOUS | Status: DC | PRN
  Administered 2022-02-08: 30 ug/min via INTRAVENOUS

## 2022-02-08 MED ORDER — PROMETHAZINE HCL 12.5 MG PO TABS: 12.5000 mg | ORAL_TABLET | Freq: Four times a day (QID) | ORAL | Status: DC | PRN

## 2022-02-08 MED ORDER — FENTANYL CITRATE (PF) 100 MCG/2ML IJ SOLN
INTRAMUSCULAR | Status: DC | PRN
  Administered 2022-02-08: 75 ug via INTRAVENOUS
  Administered 2022-02-08: 50 ug via INTRAVENOUS

## 2022-02-08 MED ORDER — ACETAMINOPHEN 10 MG/ML IV SOLN: 1000.0000 mg | Freq: Once | INTRAVENOUS | Status: DC | PRN

## 2022-02-08 MED ORDER — LIDOCAINE 2% (20 MG/ML) 5 ML SYRINGE
INTRAMUSCULAR | Status: DC | PRN
  Administered 2022-02-08: 100 mg via INTRAVENOUS

## 2022-02-08 MED ORDER — SODIUM CHLORIDE 0.9 % IV SOLN
2.0000 g | Freq: Once | INTRAVENOUS | Status: AC
  Administered 2022-02-08: 2 g via INTRAVENOUS
  Filled 2022-02-08: qty 2

## 2022-02-08 MED ORDER — MIDAZOLAM HCL 5 MG/5ML IJ SOLN
INTRAMUSCULAR | Status: DC | PRN
  Administered 2022-02-08: 2 mg via INTRAVENOUS

## 2022-02-08 MED ORDER — DOCUSATE SODIUM 100 MG PO CAPS
100.0000 mg | ORAL_CAPSULE | Freq: Two times a day (BID) | ORAL | Status: DC
  Administered 2022-02-08: 100 mg via ORAL
  Filled 2022-02-08: qty 1

## 2022-02-08 MED ORDER — ONDANSETRON HCL 4 MG/2ML IJ SOLN
INTRAMUSCULAR | Status: DC | PRN
  Administered 2022-02-08: 4 mg via INTRAVENOUS

## 2022-02-08 MED ORDER — ONDANSETRON HCL 4 MG/2ML IJ SOLN
INTRAMUSCULAR | Status: AC
  Filled 2022-02-08: qty 2

## 2022-02-08 MED ORDER — LACTATED RINGERS IV SOLN: INTRAVENOUS | Status: DC

## 2022-02-08 MED ORDER — SUGAMMADEX SODIUM 200 MG/2ML IV SOLN
INTRAVENOUS | Status: DC | PRN
  Administered 2022-02-08: 400 mg via INTRAVENOUS

## 2022-02-08 MED ORDER — PANTOPRAZOLE SODIUM 40 MG PO TBEC
40.0000 mg | DELAYED_RELEASE_TABLET | Freq: Every day | ORAL | Status: DC
  Administered 2022-02-09: 40 mg via ORAL
  Filled 2022-02-08: qty 1

## 2022-02-08 MED ORDER — AMLODIPINE BESYLATE 5 MG PO TABS
5.0000 mg | ORAL_TABLET | Freq: Every day | ORAL | Status: DC
  Administered 2022-02-09: 5 mg via ORAL
  Filled 2022-02-08: qty 1

## 2022-02-08 MED ORDER — ROCURONIUM BROMIDE 10 MG/ML (PF) SYRINGE
PREFILLED_SYRINGE | INTRAVENOUS | Status: DC | PRN
  Administered 2022-02-08: 80 mg via INTRAVENOUS

## 2022-02-08 MED ORDER — PROPOFOL 10 MG/ML IV BOLUS
INTRAVENOUS | Status: DC | PRN
  Administered 2022-02-08: 180 mg via INTRAVENOUS

## 2022-02-08 MED ORDER — AMISULPRIDE (ANTIEMETIC) 5 MG/2ML IV SOLN: 10.0000 mg | Freq: Once | INTRAVENOUS | Status: DC | PRN

## 2022-02-08 MED ORDER — PHENYLEPHRINE HCL-NACL 20-0.9 MG/250ML-% IV SOLN
INTRAVENOUS | Status: AC
  Filled 2022-02-08: qty 250

## 2022-02-08 MED ORDER — HYDROCODONE-ACETAMINOPHEN 5-325 MG PO TABS
1.0000 | ORAL_TABLET | ORAL | Status: DC | PRN
  Administered 2022-02-08: 1 via ORAL
  Administered 2022-02-08: 2 via ORAL
  Administered 2022-02-09: 1 via ORAL
  Filled 2022-02-08: qty 1
  Filled 2022-02-08: qty 2
  Filled 2022-02-08: qty 1

## 2022-02-08 MED ORDER — CHLORHEXIDINE GLUCONATE 0.12 % MT SOLN: 15.0000 mL | Freq: Once | OROMUCOSAL | Status: DC

## 2022-02-08 MED ORDER — CARVEDILOL 6.25 MG PO TABS
6.2500 mg | ORAL_TABLET | Freq: Two times a day (BID) | ORAL | Status: DC
  Administered 2022-02-08 – 2022-02-09 (×3): 6.25 mg via ORAL
  Filled 2022-02-08 (×3): qty 1

## 2022-02-08 MED ORDER — OXYCODONE HCL 5 MG/5ML PO SOLN: 5.0000 mg | Freq: Once | ORAL | Status: DC | PRN

## 2022-02-08 NOTE — Procedures (Signed)
Interventional Radiology Procedure Note  Procedure:  1.) Image guided bx of liver lesion 2.) MWA of liver lesion  Complications: None  Estimated Blood Loss: None  Recommendations: - Admit for obs   Signed,  Criselda Peaches, MD

## 2022-02-08 NOTE — Anesthesia Postprocedure Evaluation (Signed)
Anesthesia Post Note  Patient: Anthony Calhoun  Procedure(s) Performed: CT MICROWAVE ABLATION     Patient location during evaluation: PACU Anesthesia Type: General Level of consciousness: awake and alert Pain management: pain level controlled Vital Signs Assessment: post-procedure vital signs reviewed and stable Respiratory status: spontaneous breathing, nonlabored ventilation and respiratory function stable Cardiovascular status: blood pressure returned to baseline and stable Postop Assessment: no apparent nausea or vomiting Anesthetic complications: no   No notable events documented.  Last Vitals:  Vitals:   02/08/22 1630 02/08/22 1719  BP: 131/61 (!) 148/81  Pulse: 72 72  Resp: 12 16  Temp:  36.6 C  SpO2: 97% 98%    Last Pain:  Vitals:   02/08/22 1719  TempSrc: Oral  PainSc:                  Lidia Collum

## 2022-02-08 NOTE — Anesthesia Procedure Notes (Addendum)
Procedure Name: Intubation Date/Time: 02/08/2022 2:10 PM  Performed by: Montel Clock, CRNAPre-anesthesia Checklist: Patient identified, Emergency Drugs available, Suction available, Patient being monitored and Timeout performed Patient Re-evaluated:Patient Re-evaluated prior to induction Oxygen Delivery Method: Circle system utilized Preoxygenation: Pre-oxygenation with 100% oxygen Induction Type: IV induction Ventilation: Two handed mask ventilation required and Oral airway inserted - appropriate to patient size Laryngoscope Size: 3 and Glidescope Grade View: Grade I Tube type: Oral Tube size: 7.5 mm Number of attempts: 1 Airway Equipment and Method: Stylet Placement Confirmation: ETT inserted through vocal cords under direct vision, positive ETCO2 and breath sounds checked- equal and bilateral Secured at: 25 cm Tube secured with: Tape Dental Injury: Teeth and Oropharynx as per pre-operative assessment  Comments: Small oral opening, 2 handed mask with oral airway needed, patient stated cotton mouth feeling with cervical manipulation, ETT required manipulation at tip for advancement - Anthony Calhoun, [facility]

## 2022-02-08 NOTE — Progress Notes (Signed)
Chief Complaint: Patient was seen in consultation today for  CT/US-guided thermal ablation/possible biopsy of right liver lesion   Referring Physician(s): Michaelle Birks, Carlean Jews  Supervising Physician: Jacqulynn Cadet  Patient Status: [facility] - Out-pt TBA  History of Present Illness: Anthony Calhoun is a 56 y.o. 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 He had a right colectomy performed in August of 2021, and adjuvant Xeloda treatment completed in February 2022. Further imaging showed no recurrent disease, but CEA was elevated and rising in early 2023. PET/CT performed in May 2023 showed a singular hypermetabolic right liver lesions, and subsequent MRI in May 2023 identified a 1.2cm right liver lesion positioned between segment 7 and 8, with features most compatible with metastasis. Pt was also found to have hepatic steatosis and subtle changes of cirrhosis. Pt was referred to IR for evaluation for CT guided ablation of his liver lesion to be performed by Dr. Laurence Ferrari on 02/08/22.  Prior to presenting for his procedure, the patient informed clinical team that additional imaging he received had indicated a potential area of concern on his bowels, but after speaking with Dr. Laurence Ferrari, the patient felt it was best to proceed with the procedure today while waiting for the other concern to be investigated further.    Past Medical History:  Diagnosis Date   Anemia    Anxiety    Arthritis    back,    Cancer ([place])    cecum   Depression    Diabetes mellitus without complication ([place])    Dyspnea    started with anemia   GERD (gastroesophageal reflux disease)    History of kidney stones    Hypertension    Neuromuscular disorder (HCC)    carpal tunnel    Past Surgical History:  Procedure Laterality Date   APPENDECTOMY     COLON SURGERY     COLONOSCOPY     CYSTOSCOPY W/ URETEROSCOPY W/ LITHOTRIPSY  1981    IR RADIOLOGIST EVAL & MGMT  01/17/2022    Allergies: Chlorthalidone, Cyclobenzaprine, Gabapentin, Losartan potassium-hctz, and Prednisone  Medications: Prior to Admission medications   Medication Sig Start Date End Date Taking? Authorizing Provider  ALPRAZolam Duanne Moron) 1 MG tablet Take one tablet three times daily as needed for anxiety. 12/09/21  Yes Mozingo, Berdie Ogren, NP  amLODipine (NORVASC) 5 MG tablet Take 5 mg by mouth daily. 03/11/20  Yes [provider]  atorvastatin (LIPITOR) 40 MG tablet Take 40 mg by mouth daily. 02/18/20  Yes [provider]  carvedilol (COREG) 6.25 MG tablet Take 6.25 mg by mouth 2 (two) times daily with a meal. 10/01/19  Yes [provider]  ferrous sulfate 325 (65 FE) MG EC tablet Take 1 tablet (325 mg total) by mouth 2 (two) times daily with a meal. 01/24/22  Yes Ladell Pier, MD  magic mouthwash SOLN Take 5 mLs by mouth 4 (four) times daily as needed for mouth pain (swish for 1 minute and then spit for mouth pain). 01/31/22  Yes Ladell Pier, MD  omeprazole (PRILOSEC) 20 MG capsule Take 20 mg by mouth in the morning and at bedtime.   Yes [provider]  sertraline (ZOLOFT) 100 MG tablet TAKE 2 TABLETS BY MOUTH EVERY DAY 12/09/21  Yes Mozingo, Berdie Ogren, NP  triamcinolone (KENALOG) 0.1 % paste Use as directed 1 application. in the mouth or throat 2 (two) times daily. 01/17/22  Yes [provider]  ondansetron (ZOFRAN) 8 MG tablet Take 1 tablet (8 mg total) by mouth every 8 (eight) hours as needed for nausea. 01/31/22   Ladell Pier, MD  promethazine (PHENERGAN) 12.5 MG tablet Take 1 tablet (12.5 mg total) by mouth every 6 (six) hours as needed for nausea. 01/26/22   Ladell Pier, MD     History reviewed. No pertinent family history.  Social History   Socioeconomic History   Marital status: Divorced    Spouse name: Not on file   Number of children: Not on file   Years of education: Not on file    Highest education level: Not on file  Occupational History   Not on file  Tobacco Use   Smoking status: Never   Smokeless tobacco: Never  Vaping Use   Vaping Use: Never used  Substance and Sexual Activity   Alcohol use: No   Drug use: No   Sexual activity: Not on file  Other Topics Concern   Not on file  Social History Narrative   Not on file   Social Determinants of Health   Financial Resource Strain: Medium Risk (01/17/2022)   Overall Financial Resource Strain (CARDIA)    Difficulty of Paying Living Expenses: Somewhat hard  Food Insecurity: Food Insecurity Present (01/17/2022)   Hunger Vital Sign    Worried About Running Out of Food in the Last Year: Sometimes true    Ran Out of Food in the Last Year: Never true  Transportation Needs: No Transportation Needs (01/17/2022)   PRAPARE - Hydrologist (Medical): No    Lack of Transportation (Non-Medical): No  Physical Activity: Inactive (01/17/2022)   Exercise Vital Sign    Days of Exercise per Week: 0 days    Minutes of Exercise per Session: 0 min  Stress: Stress Concern Present (01/17/2022)   [place]    Feeling of Stress : Rather much  Social Connections: Socially Isolated (01/17/2022)   Social Connection and Isolation Panel [NHANES]    Frequency of Communication with Friends and Family: Three times a week    Frequency of Social Gatherings with Friends and Family: Three times a week    Attends Religious Services: Never    Active Member of Clubs or Organizations: No    Attends Archivist Meetings: Never    Marital Status: Divorced      Review of Systems: A 12 point ROS discussed and pertinent positives are indicated in the HPI above.  All other systems are negative.  Review of Systems  Constitutional:  Positive for fatigue. Negative for chills.  HENT:         Pt states that he has felt like his throat is dry but that  it is not painful  Respiratory:  Positive for shortness of breath. Negative for chest tightness.   Cardiovascular:  Negative for chest pain and leg swelling.  Gastrointestinal:  Positive for diarrhea and nausea. Negative for abdominal pain and vomiting.       Patient states that he frequently has nausea, and that he believes his diarrhea is due to the iron supplements that he takes.  Neurological:  Negative for dizziness and headaches.  Psychiatric/Behavioral:  Negative for confusion.     Vital Signs: BP (!) 149/88 (BP Location: Right Arm)   Pulse 82   Temp 97.9 F (36.6 C) (Oral)   Resp 15   Ht '5\' 6"'$  (1.676 m)   Wt 270  lb 15.1 oz (122.9 kg)   SpO2 97%   BMI 43.73 kg/m   Physical Exam Vitals reviewed.  Constitutional:      Appearance: He is obese.  HENT:     Mouth/Throat:     Mouth: Mucous membranes are moist.  Cardiovascular:     Rate and Rhythm: Normal rate and regular rhythm.     Pulses: Normal pulses.     Heart sounds: Normal heart sounds.  Pulmonary:     Effort: Pulmonary effort is normal.     Breath sounds: Normal breath sounds.  Musculoskeletal:     Right lower leg: No edema.     Left lower leg: No edema.  Skin:    General: Skin is warm and dry.  Neurological:     Mental Status: He is alert and oriented to person, place, and time.  Psychiatric:        Behavior: Behavior normal.     Imaging: DG Chest 1 View  Result Date: 02/08/2022 CLINICAL DATA:  Preprocedural radiograph EXAM: CHEST  1 VIEW COMPARISON:  Chest x-ray dated November thirtieth 2017 FINDINGS: The heart size and mediastinal contours are within normal limits. Both lungs are clear. The visualized skeletal structures are unremarkable. IMPRESSION: No active disease. Electronically Signed   By: Yetta Glassman M.D.   On: 02/08/2022 13:02   CT ENTERO ABD/PELVIS W CONTAST  Result Date: 02/03/2022 CLINICAL DATA:  Metastatic colon cancer. Rising CEA level. In addition to a metastatic lesion in the liver,  prior PET-CT showed accentuated activity along central abdominal loops of small bowel. * Tracking Code: BO * EXAM: CT ABDOMEN AND PELVIS WITH CONTRAST (ENTEROGRAPHY) TECHNIQUE: Multidetector CT of the abdomen and pelvis during bolus administration of intravenous contrast. Negative oral contrast was given. RADIATION DOSE REDUCTION: This exam was performed according to the departmental dose-optimization program which includes automated exposure control, adjustment of the mA and/or kV according to patient size and/or use of iterative reconstruction technique. CONTRAST:  116m OMNIPAQUE IOHEXOL 300 MG/ML  SOLN COMPARISON:  PET-CT 12/30/2021.  MRI abdomen 01/10/2022 FINDINGS: Lower chest:  Small type 1 hiatal hernia. Hepatobiliary: The known right hepatic lobe lesion shown on MRI of 01/10/2022 and PET-CT of 12/30/2021 is relatively occult on today's CT examination. Gallbladder unremarkable. Pancreas: Unremarkable Spleen: Unremarkable Adrenals/Urinary Tract: Two tiny hypodense lesions in the right kidney are technically too small to characterize by CT, although statistically highly likely to be benign cysts. These had no worrisome characteristics on MRI. Adrenal glands unremarkable. Urinary bladder appears normal. Stomach/Bowel: The stomach and duodenum appear normal. Based on the course of the mesenteric veins, I suspect that the bowel loops associated with the accentuated metabolic activity shown in upper abdomen on the prior PET-CT is the anterior small bowel loop shown on images 43 through 47 of series 2 on today's examination. On images 43-44 there appears to be a small marginal lymph node or diverticulum in this vicinity there was previously along the involved bowel loop. In addition to the small density along the margin of the bowel wall a there is some subtle mesenteric edema as well as some equivocal accentuated enhancement in this vicinity complicated by the confluence of bowel loops. While previous PET activity  could be inflammatory due to a presumed diverticulum, it is difficult in this setting to exclude the possibility of a local small bowel tumor. Vascular/Lymphatic: Atherosclerosis is present, including aortoiliac atherosclerotic disease. No pathologic adenopathy observed. Reproductive: Unremarkable Other: Tiny left anterior omental nodule measuring 4 by 3 mm  on image 35 series 2, not readily seen on prior exams. This may be an incidental small omental lymph node but careful attention to this region in the rest of the omentum is suggested on follow up imaging. Musculoskeletal: Posterior intervertebral spurring at the L1-2 level. Grade 1 degenerative anterolisthesis at L5-S1. IMPRESSION: 1. The bowel loop demonstrating hypermetabolic activity has shifted somewhat to the right on today's exam, but continues to have a bandlike density along its margin which could represent a diverticulum or perhaps adjacent nodal tissue, as well as some accentuated enhancement focally. Although the appearance could be due to local inflammation from potential small bowel diverticulum, it is difficult to exclude small bowel tumor given the prior hypermetabolic activity. There is no dilated bowel. Possibilities for further workup may include capsule endoscopy or PET-CT surveillance. 2. 3 by 4 mm left omental nodule or lymph node, probably benign but attention to this region on surveillance imaging is suggested. 3. The known right hepatic lobe tumor is occult on CT. 4. Small hiatal hernia. 5.  Aortic Atherosclerosis (ICD10-I70.0). Electronically Signed   By: Van Clines M.D.   On: 02/03/2022 16:48   IR Radiologist Eval & Mgmt  Result Date: 01/17/2022 Please refer to notes tab for details about interventional procedure. (Op Note)  MR LIVER W WO CONTRAST  Result Date: 01/11/2022 CLINICAL DATA:  55 year old male with history of metastatic colon cancer. Staging examination. EXAM: MRI ABDOMEN WITHOUT AND WITH CONTRAST TECHNIQUE:  Multiplanar multisequence MR imaging of the abdomen was performed both before and after the administration of intravenous contrast. CONTRAST:  44m GADAVIST GADOBUTROL 1 MMOL/ML IV SOLN COMPARISON:  PET-CT 12/30/2021. CT the chest, abdomen and pelvis 03/07/2021. FINDINGS: Lower chest: Small hiatal hernia. Hepatobiliary: Diffuse loss of signal intensity throughout the hepatic parenchyma on out of phase dual echo images, indicative of a background of hepatic steatosis. Liver has a slightly nodular contour suggesting early changes of cirrhosis. In the right lobe of the liver between segments 7 and 8 (axial image 12 of series 4) there is a 1.2 cm lesion which is T1 hypointense, mildly T2 hyperintense, demonstrates restricted diffusion, and demonstrates peripheral enhancement on early post gadolinium imaging, which persists on delayed imaging, with central hypervascularity. No other definite suspicious hepatic lesions are noted. 2 mm T1 hypointense, T2 hyperintense, nonenhancing lesion in segment 6 of the liver incidentally noted, compatible with a tiny cyst or biliary hamartoma. No intra or extrahepatic biliary ductal dilatation. Gallbladder is normal in appearance. Pancreas: No pancreatic mass. No pancreatic ductal dilatation. No pancreatic or peripancreatic fluid collections or inflammatory changes. Spleen:  Unremarkable. Adrenals/Urinary Tract: Bilateral kidneys and bilateral adrenal glands are normal in appearance. No hydroureteronephrosis in the visualized portions of the abdomen. Stomach/Bowel: Visualized portions are unremarkable. Vascular/Lymphatic: Aortic atherosclerosis without evidence of aneurysm in the abdominal vasculature. No lymphadenopathy noted in the abdomen. Other: No significant volume of ascites noted in the visualized portions of the peritoneal cavity. Musculoskeletal: No aggressive appearing osseous lesions are noted in the visualized portions of the skeleton. IMPRESSION: 1. Solitary lesion  measuring 1.2 cm in the right lobe of the liver between segments 7 and 8 of the liver which has imaging characteristics most compatible with a metastatic lesion. 2. 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 Hepatic steatosis. 3. Small hiatal hernia. Electronically Signed   By: DVinnie LangtonM.D.   On: 01/11/2022 09:48    Labs:  CBC: Recent Labs    01/24/22 0959 01/30/22 1314 02/07/22 0948 02/08/22 1250  WBC 8.6 10.1 8.8 8.0  HGB 9.3* 9.4* 8.8* 9.2*  HCT 33.5* 33.0* 31.4* 32.3*  PLT 338 396 348 349    COAGS: Recent Labs    02/08/22 1250  INR 1.0    BMP: Recent Labs    03/07/21 1223 01/30/22 1314 02/08/22 1250  NA 136 137 138  K 3.9 3.5 3.5  CL 101 107 108  CO2 '26 25 24  '$ GLUCOSE 124* 180* 151*  BUN '12 14 14  '$ CALCIUM 9.0 8.4* 8.4*  CREATININE 0.86 0.84 0.78  GFRNONAA >60 >60 >60    LIVER FUNCTION TESTS: Recent Labs    03/07/21 1223 02/08/22 1250  BILITOT 0.6 0.6  AST 28 25  ALT 25 20  ALKPHOS 100 93  PROT 7.2 7.3  ALBUMIN 4.1 3.6    TUMOR MARKERS: Recent Labs    08/15/21 1434 09/26/21 1507 10/31/21 0846 12/01/21 0837  CEA 4.72 5.42* 5.77* 6.78*    Assessment and Plan: Patient is a 56 year old male presenting for treatment of a right liver lesion (known hx colon cancer). Percutaneous biopsy and concurrent thermal ablation of lesion planned for 02/08/22 to be performed by Dr. Laurence Ferrari. Patient to be admitted for overnight observation following procedure.     Detailed risks of procedure discussed with the patient, including risk of bleeding, infection, risk to adjacent structures, and risks related to general anesthesia. Patient understood and gave consent to proceed. Consent signed and in patient chart.  Thank you for this interesting consult.  I greatly enjoyed meeting Talha Iser Cottrell and look forward to participating in their care.  A copy of this report was sent to the requesting provider on this  date.  Electronically Signed: Lura Em, PA 02/08/2022, 1:41 PM   I spent a total of    25 Minutes in face to face in clinical consultation, greater than 50% of which was counseling/coordinating care for CT guided biopsy and thermal ablation of right liver lesion.

## 2022-02-08 NOTE — Transfer of Care (Signed)
Immediate Anesthesia Transfer of Care Note  Patient: Anthony Calhoun  Procedure(s) Performed: CT MICROWAVE ABLATION  Patient Location: PACU  Anesthesia Type:General  Level of Consciousness: awake and alert   Airway & Oxygen Therapy: Patient Spontanous Breathing and Patient connected to face mask oxygen  Post-op Assessment: Report given to RN, Post -op Vital signs reviewed and stable and Patient moving all extremities X 4  Post vital signs: Reviewed and stable  Last Vitals:  Vitals Value Taken Time  BP 136/77 02/08/22 1553  Temp    Pulse 72 02/08/22 1554  Resp 13 02/08/22 1554  SpO2 95 % 02/08/22 1554  Vitals shown include unvalidated device data.  Last Pain:  Vitals:   02/08/22 1236  TempSrc:   PainSc: 0-No pain         Complications: No notable events documented.

## 2022-02-09 ENCOUNTER — Encounter (HOSPITAL_COMMUNITY): Payer: Self-pay | Admitting: Interventional Radiology

## 2022-02-09 ENCOUNTER — Encounter: Payer: Self-pay | Admitting: Oncology

## 2022-02-09 DIAGNOSIS — C787 Secondary malignant neoplasm of liver and intrahepatic bile duct: Secondary | ICD-10-CM | POA: Diagnosis not present

## 2022-02-09 LAB — CBC
HCT: 30.2 % — ABNORMAL LOW (ref 39.0–52.0)
Hemoglobin: 8.4 g/dL — ABNORMAL LOW (ref 13.0–17.0)
MCH: 20.1 pg — ABNORMAL LOW (ref 26.0–34.0)
MCHC: 27.8 g/dL — ABNORMAL LOW (ref 30.0–36.0)
MCV: 72.4 fL — ABNORMAL LOW (ref 80.0–100.0)
Platelets: 374 10*3/uL (ref 150–400)
RBC: 4.17 MIL/uL — ABNORMAL LOW (ref 4.22–5.81)
RDW: 17.2 % — ABNORMAL HIGH (ref 11.5–15.5)
WBC: 11.8 10*3/uL — ABNORMAL HIGH (ref 4.0–10.5)
nRBC: 0 % (ref 0.0–0.2)

## 2022-02-09 MED ORDER — DOCUSATE SODIUM 100 MG PO CAPS: 100.0000 mg | ORAL_CAPSULE | Freq: Two times a day (BID) | ORAL | 0 refills | Status: DC

## 2022-02-09 MED ORDER — CEPHALEXIN 500 MG PO CAPS: 500.0000 mg | ORAL_CAPSULE | Freq: Four times a day (QID) | ORAL | 0 refills | Status: AC

## 2022-02-09 MED ORDER — OXYCODONE HCL 5 MG PO TABS: 5.0000 mg | ORAL_TABLET | Freq: Four times a day (QID) | ORAL | 0 refills | Status: DC | PRN

## 2022-02-09 MED ORDER — METRONIDAZOLE 500 MG PO TABS
500.0000 mg | ORAL_TABLET | Freq: Three times a day (TID) | ORAL | 0 refills | Status: AC
Start: 2022-02-09 — End: 2022-02-14

## 2022-02-09 NOTE — Progress Notes (Signed)
Transition of Care (TOC) Screening Note  Patient Details  Name: Vanna Shavers Desha Date of Birth: October 21, 1965  Transition of Care [facility] CM/SW Contact:    Sherie Don, LCSW Phone Number: 02/09/2022, 10:47 AM  Transition of Care Department [facility]) has reviewed patient and no TOC needs have been identified at this time. We will continue to monitor patient advancement through interdisciplinary progression rounds. If new patient transition needs arise, please place a TOC consult.

## 2022-02-09 NOTE — Discharge Summary (Addendum)
Patient ID: Anthony Calhoun MRN: [id number] DOB/AGE: 57-Apr-1967 57 y.o.  Admit date: 02/08/2022 Discharge date: 02/09/2022  Supervising Physician: Jacqulynn Cadet  Patient Status: [facility] - In-pt  Admission Diagnoses: Metastatic colon cancer to liver  Discharge Diagnoses: Metastatic colon cancer to liver S/p 1. Technically successful image guided core biopsy of hepatic lesion. 2. Technically successful image guided percutaneous microwave ablation of presumed metastatic lesion to the liver. 6/14    Discharged Condition: good  Hospital Course: Anthony Calhoun is a 56 yo male who presented to [facility] for an image guided core biopsy and microwave ablation of a right liver lesion. The procedure was tolerated well by the patient. He was observed overnight following his procedure. Patient is stable and ready for discharge on 02/09/22. Above discussed with Dr. Laurence Ferrari. Follow-up Hgb was 8.4. He will follow-up with Dr. Laurence Ferrari in 2 weeks. Prescriptions were sent in electronically to patient's pharmacy for oxycodone, keflex, flagyl, colace. Patient told to contact our service with any additional questions or concerns.  Consults:  Anesthesia  Significant Diagnostic Studies:  Results for orders placed or performed during the hospital encounter of 02/08/22  CBC with Differential/Platelet  Result Value Ref Range   WBC 8.0 4.0 - 10.5 K/uL   RBC 4.51 4.22 - 5.81 MIL/uL   Hemoglobin 9.2 (L) 13.0 - 17.0 g/dL   HCT 32.3 (L) 39.0 - 52.0 %   MCV 71.6 (L) 80.0 - 100.0 fL   MCH 20.4 (L) 26.0 - 34.0 pg   MCHC 28.5 (L) 30.0 - 36.0 g/dL   RDW 17.7 (H) 11.5 - 15.5 %   Platelets 349 150 - 400 K/uL   nRBC 0.2 0.0 - 0.2 %   Neutrophils Relative % 74 %   Neutro Abs 6.0 1.7 - 7.7 K/uL   Lymphocytes Relative 19 %   Lymphs Abs 1.5 0.7 - 4.0 K/uL   Monocytes Relative 5 %   Monocytes Absolute 0.4 0.1 - 1.0 K/uL   Eosinophils Relative 1 %   Eosinophils Absolute 0.1 0.0 - 0.5 K/uL   Basophils Relative 0  %   Basophils Absolute 0.0 0.0 - 0.1 K/uL   Immature Granulocytes 1 %   Abs Immature Granulocytes 0.04 0.00 - 0.07 K/uL  Protime-INR  Result Value Ref Range   Prothrombin Time 13.5 11.4 - 15.2 seconds   INR 1.0 0.8 - 1.2  Comprehensive metabolic panel  Result Value Ref Range   Sodium 138 135 - 145 mmol/L   Potassium 3.5 3.5 - 5.1 mmol/L   Chloride 108 98 - 111 mmol/L   CO2 24 22 - 32 mmol/L   Glucose, Bld 151 (H) 70 - 99 mg/dL   BUN 14 6 - 20 mg/dL   Creatinine, Ser 0.78 0.61 - 1.24 mg/dL   Calcium 8.4 (L) 8.9 - 10.3 mg/dL   Total Protein 7.3 6.5 - 8.1 g/dL   Albumin 3.6 3.5 - 5.0 g/dL   AST 25 15 - 41 U/L   ALT 20 0 - 44 U/L   Alkaline Phosphatase 93 38 - 126 U/L   Total Bilirubin 0.6 0.3 - 1.2 mg/dL   GFR, Estimated >60 >60 mL/min   Anion gap 6 5 - 15  Glucose, capillary  Result Value Ref Range   Glucose-Capillary 161 (H) 70 - 99 mg/dL  Glucose, capillary  Result Value Ref Range   Glucose-Capillary 153 (H) 70 - 99 mg/dL  CBC  Result Value Ref Range   WBC 11.8 (H) 4.0 - 10.5 K/uL  RBC 4.17 (L) 4.22 - 5.81 MIL/uL   Hemoglobin 8.4 (L) 13.0 - 17.0 g/dL   HCT 30.2 (L) 39.0 - 52.0 %   MCV 72.4 (L) 80.0 - 100.0 fL   MCH 20.1 (L) 26.0 - 34.0 pg   MCHC 27.8 (L) 30.0 - 36.0 g/dL   RDW 17.2 (H) 11.5 - 15.5 %   Platelets 374 150 - 400 K/uL   nRBC 0.0 0.0 - 0.2 %  Type and screen  Result Value Ref Range   ABO/RH(D) O POS    Antibody Screen NEG    Sample Expiration      02/11/2022,2359 Performed at [facility], [place] [address], [place], [place] [address]      Treatments: 6/14 1. Technically successful image guided core biopsy of hepatic lesion. 2. Technically successful image guided percutaneous microwave ablation of presumed metastatic lesion to the liver.  Discharge Exam: Blood pressure (!) 142/71, pulse 74, temperature 98 F (36.7 C), temperature source Oral, resp. rate 18, height '5\' 6"'$  (1.676 m), weight 270 lb 15.1 oz (122.9 kg), SpO2 94  %. Pt was alert and oriented x3 at time of exam. He states he had minor 2/10 pain at his procedure site. Lungs were CTA bilaterally. Heart sounds were regular rate and rhythm. Pulses were 2+ in upper extremities. Puncture site RUQ abdomen was soft with no hematoma. Bowel sounds were normal, though patient reports not having a bowel movement since the procedure.Patient reports no pain with urination and states there is no hematuria.   Disposition: Discharge disposition: 01-Home or Self Care       Discharge Instructions     Call MD for:  difficulty breathing, headache or visual disturbances   Complete by: As directed    Call MD for:  extreme fatigue   Complete by: As directed    Call MD for:  hives   Complete by: As directed    Call MD for:  persistant dizziness or light-headedness   Complete by: As directed    Call MD for:  persistant nausea and vomiting   Complete by: As directed    Call MD for:  redness, tenderness, or signs of infection (pain, swelling, redness, odor or green/yellow discharge around incision site)   Complete by: As directed    Call MD for:  severe uncontrolled pain   Complete by: As directed    Call MD for:  temperature >100.4   Complete by: As directed    Change dressing (specify)   Complete by: As directed    May place band-aids on puncture site in right upper abdomen for next 2-3 days; may wash site with soap and water   Diet - low sodium heart healthy   Complete by: As directed    Discharge instructions   Complete by: As directed    Resume home medications; stay well hydrated; no strenuous activities for 3-4 days;   Driving Restrictions   Complete by: As directed    No driving for next 24 hours or after taking narcotics   Increase activity slowly   Complete by: As directed    Lifting restrictions   Complete by: As directed    No heavy lifting for 3-4 days   May shower / Bathe   Complete by: As directed    May walk up steps   Complete by: As  directed       Allergies as of 02/09/2022       Reactions   Chlorthalidone Swelling  Swelling of lips   Cyclobenzaprine Other (See Comments)   Unknown reaction   Gabapentin Nausea Only   Losartan Potassium-hctz Other (See Comments)   Prednisone Anxiety   "panic attack" Panic attacks        Medication List     TAKE these medications    ALPRAZolam 1 MG tablet Commonly known as: XANAX Take one tablet three times daily as needed for anxiety.   amLODipine 5 MG tablet Commonly known as: NORVASC Take 5 mg by mouth daily.   atorvastatin 40 MG tablet Commonly known as: LIPITOR Take 40 mg by mouth daily.   carvedilol 6.25 MG tablet Commonly known as: COREG Take 6.25 mg by mouth 2 (two) times daily with a meal.   cephALEXin 500 MG capsule Commonly known as: Keflex Take 1 capsule (500 mg total) by mouth 4 (four) times daily for 5 days.   docusate sodium 100 MG capsule Commonly known as: COLACE Take 1 capsule (100 mg total) by mouth 2 (two) times daily.   ferrous sulfate 325 (65 FE) MG EC tablet Take 1 tablet (325 mg total) by mouth 2 (two) times daily with a meal.   magic mouthwash Soln Take 5 mLs by mouth 4 (four) times daily as needed for mouth pain (swish for 1 minute and then spit for mouth pain).   metroNIDAZOLE 500 MG tablet Commonly known as: Flagyl Take 1 tablet (500 mg total) by mouth 3 (three) times daily for 5 days.   omeprazole 20 MG capsule Commonly known as: PRILOSEC Take 20 mg by mouth in the morning and at bedtime.   ondansetron 8 MG tablet Commonly known as: ZOFRAN Take 1 tablet (8 mg total) by mouth every 8 (eight) hours as needed for nausea.   oxyCODONE 5 MG immediate release tablet Commonly known as: Roxicodone Take 1 tablet (5 mg total) by mouth every 6 (six) hours as needed for moderate pain.   promethazine 12.5 MG tablet Commonly known as: PHENERGAN Take 1 tablet (12.5 mg total) by mouth every 6 (six) hours as needed for nausea.    sertraline 100 MG tablet Commonly known as: ZOLOFT TAKE 2 TABLETS BY MOUTH EVERY DAY   triamcinolone 0.1 % paste Commonly known as: KENALOG Use as directed 1 application. in the mouth or throat 2 (two) times daily.               Discharge Care Instructions  (From admission, onward)           Start     Ordered   02/09/22 0000  Change dressing (specify)       Comments: May place band-aids on puncture site in right upper abdomen for next 2-3 days; may wash site with soap and water   02/09/22 1323            Follow-up Information     Criselda Peaches, MD Follow up.   Specialties: Interventional Radiology, Radiology Why: Dr Laurence Ferrari will contact you in 2 weeks; call [phone] or [phone] with any questions Contact information: [address] [place] [address] [phone]         Ladell Pier, MD Follow up.   Specialty: Oncology Why: Follow-up with Dr Benay Spice as scheduled Contact information: [place] [address] [phone]         Dwan Bolt, MD Follow up.   Specialty: General Surgery Why: Follow-up with Dr Zenia Resides as scheduled Contact information: [place]  [place] [address] [phone]                  Electronically Signed: D. Rowe Robert, PA-C & Reatha Armour PA-C 02/09/2022, 1:27 PM   I have spent Less Than 30 Minutes discharging Celina.

## 2022-02-09 NOTE — Discharge Instructions (Signed)
Resume home medications, stay well hydrated, no strenuous activities for 3-4 days, do not drive for 24 hours or after taking narcotics

## 2022-02-10 LAB — SURGICAL PATHOLOGY

## 2022-02-13 ENCOUNTER — Inpatient Hospital Stay: Payer: 59

## 2022-02-13 ENCOUNTER — Other Ambulatory Visit: Payer: Self-pay | Admitting: *Deleted

## 2022-02-13 DIAGNOSIS — C182 Malignant neoplasm of ascending colon: Secondary | ICD-10-CM

## 2022-02-13 NOTE — Progress Notes (Signed)
Ferritin added for 9/20 per Ned Card NP.

## 2022-02-14 ENCOUNTER — Inpatient Hospital Stay: Payer: 59

## 2022-02-14 ENCOUNTER — Other Ambulatory Visit: Payer: Self-pay

## 2022-02-14 DIAGNOSIS — Z85038 Personal history of other malignant neoplasm of large intestine: Secondary | ICD-10-CM | POA: Diagnosis not present

## 2022-02-14 DIAGNOSIS — C182 Malignant neoplasm of ascending colon: Secondary | ICD-10-CM

## 2022-02-14 LAB — CBC WITH DIFFERENTIAL (CANCER CENTER ONLY)
Abs Immature Granulocytes: 0.04 10*3/uL (ref 0.00–0.07)
Basophils Absolute: 0.1 10*3/uL (ref 0.0–0.1)
Basophils Relative: 1 %
Eosinophils Absolute: 0.1 10*3/uL (ref 0.0–0.5)
Eosinophils Relative: 2 %
HCT: 33.5 % — ABNORMAL LOW (ref 39.0–52.0)
Hemoglobin: 9.1 g/dL — ABNORMAL LOW (ref 13.0–17.0)
Immature Granulocytes: 1 %
Lymphocytes Relative: 17 %
Lymphs Abs: 1.5 10*3/uL (ref 0.7–4.0)
MCH: 19.5 pg — ABNORMAL LOW (ref 26.0–34.0)
MCHC: 27.2 g/dL — ABNORMAL LOW (ref 30.0–36.0)
MCV: 71.9 fL — ABNORMAL LOW (ref 80.0–100.0)
Monocytes Absolute: 0.4 10*3/uL (ref 0.1–1.0)
Monocytes Relative: 5 %
Neutro Abs: 6.4 10*3/uL (ref 1.7–7.7)
Neutrophils Relative %: 74 %
Platelet Count: 403 10*3/uL — ABNORMAL HIGH (ref 150–400)
RBC: 4.66 MIL/uL (ref 4.22–5.81)
RDW: 18.8 % — ABNORMAL HIGH (ref 11.5–15.5)
WBC Count: 8.5 10*3/uL (ref 4.0–10.5)
nRBC: 0 % (ref 0.0–0.2)

## 2022-02-14 LAB — SAMPLE TO BLOOD BANK

## 2022-02-14 LAB — FERRITIN: Ferritin: 11 ng/mL — ABNORMAL LOW (ref 24–336)

## 2022-02-15 ENCOUNTER — Other Ambulatory Visit: Payer: Self-pay | Admitting: *Deleted

## 2022-02-15 ENCOUNTER — Other Ambulatory Visit: Payer: Self-pay

## 2022-02-15 ENCOUNTER — Ambulatory Visit: Payer: Self-pay | Admitting: Oncology

## 2022-02-15 ENCOUNTER — Encounter: Payer: Self-pay | Admitting: *Deleted

## 2022-02-15 MED ORDER — FERROUS SULFATE 325 (65 FE) MG PO TBEC: 325.0000 mg | DELAYED_RELEASE_TABLET | Freq: Three times a day (TID) | ORAL | 3 refills | Status: DC

## 2022-02-15 NOTE — Progress Notes (Signed)
The proposed treatment discussed in conference is for discussion purpose only and is not a binding recommendation.  The patients have not been physically examined, or presented with their treatment options.  Therefore, final treatment plans cannot be decided.  

## 2022-02-17 ENCOUNTER — Other Ambulatory Visit: Payer: Self-pay | Admitting: *Deleted

## 2022-02-17 ENCOUNTER — Encounter: Payer: Self-pay | Admitting: Oncology

## 2022-02-17 DIAGNOSIS — C182 Malignant neoplasm of ascending colon: Secondary | ICD-10-CM

## 2022-02-20 ENCOUNTER — Encounter: Payer: Self-pay | Admitting: Oncology

## 2022-02-22 ENCOUNTER — Telehealth: Payer: Self-pay | Admitting: *Deleted

## 2022-02-22 ENCOUNTER — Encounter: Payer: Self-pay | Admitting: Oncology

## 2022-02-22 NOTE — Telephone Encounter (Signed)
Call from [place] w/Dr. Therisa Doyne. Dr. Therisa Doyne will not be willing to order capsule endoscopy due to risk of obstruction. Suggests referral to general surgery. Referral was already placed for Dr. Leighton Ruff at [facility] on 6/23. Left VM for referral coordinator, Mya for status of referral.

## 2022-02-22 NOTE — Telephone Encounter (Signed)
Pt contacted by telephone, Informed any questions will be discussed at his appointment tomorrow.

## 2022-02-23 ENCOUNTER — Encounter: Payer: Self-pay | Admitting: Oncology

## 2022-02-23 ENCOUNTER — Inpatient Hospital Stay (HOSPITAL_BASED_OUTPATIENT_CLINIC_OR_DEPARTMENT_OTHER): Payer: 59 | Admitting: Oncology

## 2022-02-23 ENCOUNTER — Inpatient Hospital Stay: Payer: 59

## 2022-02-23 VITALS — BP 132/88 | HR 92 | Temp 97.8°F | Resp 18 | Ht 66.0 in | Wt 266.0 lb

## 2022-02-23 DIAGNOSIS — C182 Malignant neoplasm of ascending colon: Secondary | ICD-10-CM

## 2022-02-23 DIAGNOSIS — Z85038 Personal history of other malignant neoplasm of large intestine: Secondary | ICD-10-CM | POA: Diagnosis not present

## 2022-02-23 LAB — CBC WITH DIFFERENTIAL (CANCER CENTER ONLY)
Abs Immature Granulocytes: 0.02 10*3/uL (ref 0.00–0.07)
Basophils Absolute: 0.1 10*3/uL (ref 0.0–0.1)
Basophils Relative: 1 %
Eosinophils Absolute: 0.2 10*3/uL (ref 0.0–0.5)
Eosinophils Relative: 3 %
HCT: 37.1 % — ABNORMAL LOW (ref 39.0–52.0)
Hemoglobin: 10.1 g/dL — ABNORMAL LOW (ref 13.0–17.0)
Immature Granulocytes: 0 %
Lymphocytes Relative: 23 %
Lymphs Abs: 1.7 10*3/uL (ref 0.7–4.0)
MCH: 19.9 pg — ABNORMAL LOW (ref 26.0–34.0)
MCHC: 27.2 g/dL — ABNORMAL LOW (ref 30.0–36.0)
MCV: 73.2 fL — ABNORMAL LOW (ref 80.0–100.0)
Monocytes Absolute: 0.6 10*3/uL (ref 0.1–1.0)
Monocytes Relative: 8 %
Neutro Abs: 4.9 10*3/uL (ref 1.7–7.7)
Neutrophils Relative %: 65 %
Platelet Count: 384 10*3/uL (ref 150–400)
RBC: 5.07 MIL/uL (ref 4.22–5.81)
RDW: 20.9 % — ABNORMAL HIGH (ref 11.5–15.5)
WBC Count: 7.4 10*3/uL (ref 4.0–10.5)
nRBC: 0 % (ref 0.0–0.2)

## 2022-02-23 LAB — BASIC METABOLIC PANEL - CANCER CENTER ONLY
Anion gap: 11 (ref 5–15)
BUN: 10 mg/dL (ref 6–20)
CO2: 28 mmol/L (ref 22–32)
Calcium: 9.1 mg/dL (ref 8.9–10.3)
Chloride: 102 mmol/L (ref 98–111)
Creatinine: 0.89 mg/dL (ref 0.61–1.24)
GFR, Estimated: >60 mL/min (ref >60)
Glucose, Bld: 136 mg/dL — ABNORMAL HIGH (ref 70–99)
Potassium: 4.8 mmol/L (ref 3.5–5.1)
Sodium: 141 mmol/L (ref 135–145)

## 2022-02-23 LAB — FERRITIN: Ferritin: 15 ng/mL — ABNORMAL LOW (ref 24–336)

## 2022-02-23 LAB — CEA (ACCESS): CEA (CHCC): 7.72 ng/mL — ABNORMAL HIGH (ref 0.00–5.00)

## 2022-02-23 NOTE — Progress Notes (Signed)
[facility] OFFICE PROGRESS NOTE   Diagnosis: Colon cancer  INTERVAL HISTORY:   Mr. Seldon returns as scheduled.  He continues to have malaise.  No bleeding.  He resumed iron therapy yesterday. He continues to have soreness of the tongue, but this has improved. Mr. Masi underwent biopsy and ablation of the liver lesion on 02/08/2022 with pathology confirming metastatic colon cancer.  He reports tolerating the procedure well.    Objective:  Vital signs in last 24 hours:  Blood pressure 132/88, pulse 92, temperature 97.8 F (36.6 C), temperature source Oral, resp. rate 18, height 5' 6" (1.676 m), weight 266 lb (120.7 kg), SpO2 98 %.    HEENT: Mild erythema of the tongue, no discrete ulcer, no thrush Resp: Lungs clear bilaterally Cardio: Regular rate and rhythm GI: No hepatosplenomegaly, no mass, nontender Vascular: No leg edema   Lab Results:  Lab Results  Component Value Date   WBC 8.5 02/14/2022   HGB 9.1 (L) 02/14/2022   HCT 33.5 (L) 02/14/2022   MCV 71.9 (L) 02/14/2022   PLT 403 (H) 02/14/2022   NEUTROABS 6.4 02/14/2022    CMP  Lab Results  Component Value Date   NA 138 02/08/2022   K 3.5 02/08/2022   CL 108 02/08/2022   CO2 24 02/08/2022   GLUCOSE 151 (H) 02/08/2022   BUN 14 02/08/2022   CREATININE 0.78 02/08/2022   CALCIUM 8.4 (L) 02/08/2022   PROT 7.3 02/08/2022   ALBUMIN 3.6 02/08/2022   AST 25 02/08/2022   ALT 20 02/08/2022   ALKPHOS 93 02/08/2022   BILITOT 0.6 02/08/2022   GFRNONAA >60 02/08/2022   GFRAA >60 05/10/2020    Lab Results  Component Value Date   CEA1 1.08 03/07/2021   CEA 6.78 (H) 12/01/2021    Medications: I have reviewed the patient's current medications.   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 secondary to nausea (patient discontinued Xeloda on day 11 due to colonoscopy) Colonoscopy 07/09/2020-nodular mucosa at the colonic anastomosis, biopsied; patent end-to-side ileocolonic anastomosis characterized by healthy-appearing mucosa and visible sutures; nonbleeding internal hemorrhoids, biopsy with benign ulcerated anastomotic mucosa with granulation tissue Cycle 4 adjuvant Xeloda 07/19/2020, 2000 mg every morning and 1500 mg every afternoon Cycle 5 adjuvant Xeloda 08/09/2020 Cycle 6 adjuvant Xeloda 08/31/2019 Cycle 7 adjuvant Xeloda 09/20/2020 Cycle 8 adjuvant Xeloda 10/11/2020 CTs 03/07/2021-no evidence of recurrent disease, hepatic steatosis, slight wall thickening at the junction of the descending and horizontal duodenum Mild elevation of CEA 2023 12/01/2021-Guardant Reveal-ct DNA detected PET 12/30/2021-hypermetabolic right liver lesion, hypermetabolic area in a loop of mid small bowel with an SUV of 12.5, review of PET images at GI tumor conference 01/11/2022-small bowel uptake felt to be a benign finding MRI liver 01/10/2022-solitary 1.2 cm right liver lesion between segments 7 and 8 compatible with metastasis, subtle changes suggesting cirrhosis, hepatic steatosis 02/08/2022-biopsy and ablation of solitary liver lesion, adenocarcinoma consistent with metastatic colorectal adenocarcinoma Microcytic anemia secondary to #1-improved Colon polyps-sessile serrated adenoma of the descending colon, tubular adenomas with focal high-grade dysplasia of the transverse colon on colonoscopy 02/12/2020, tubular  adenoma of the mid ascending colon on the surgical specimen 04/12/2020 Family history of colon  cancer Depression Anxiety/panic attacks Hypertension Hyperlipidemia Nausea secondary to Xeloda?-Resolved Gastroesophageal reflux disease-improved following discontinuation of Xeloda Anemia, microcytic; ferritin 6 01/24/2022; stool positive for blood x3 01/25/2022; 02/02/2022 CT abdomen/pelvis enterography-bowel loop demonstrating hypermetabolic activity continues to have a bandlike density along its margin possibly representing a diverticulum or adjacent nodal tissue, difficult to exclude small bowel tumor, 3 x 4 mm left omental nodule or lymph node, known right hepatic lobe tumor is occult on CT.      Disposition: Mr. Millea has metastatic colon cancer.  He underwent biopsy and ablation of a solitary right liver lesion on 02/08/2022.  I present his case at the GI tumor conference.  The etiology of the area of small bowel thickening/hypermetabolism is unclear.  I suspect this is the bleeding source.  I encouraged him to continue 3 times daily ferrous sulfate.  Hopefully the tongue symptoms will improve with repletion of iron stores.  Dr. Roena Malady does not recommend a camera endoscopy.  I will refer Mr. Abascal to Dr. Marcello Moores to consider surgical intervention.  He will return for an office visit and CBC on 03/03/2022.  Betsy Coder, MD  02/23/2022  8:22 AM

## 2022-02-27 ENCOUNTER — Other Ambulatory Visit: Payer: Self-pay | Admitting: *Deleted

## 2022-02-27 ENCOUNTER — Encounter: Payer: Self-pay | Admitting: Oncology

## 2022-02-27 DIAGNOSIS — C182 Malignant neoplasm of ascending colon: Secondary | ICD-10-CM

## 2022-02-27 NOTE — Progress Notes (Signed)
MD made aware of patient MyChart message received today after visit with Dr. Marcello Moores. Dr. Benay Spice suggests referral to [facility] for 2nd opinion. Referral order placed and chart info forwarded.

## 2022-03-01 ENCOUNTER — Encounter: Payer: Self-pay | Admitting: *Deleted

## 2022-03-01 ENCOUNTER — Encounter: Payer: Self-pay | Admitting: Oncology

## 2022-03-01 NOTE — Progress Notes (Signed)
Informed by patient that Dr. Therisa Doyne has agreed to order capsule endoscopy for 03/10/11. Patient will hold oral ferrous sulfate beginning tomorrow. Called [facility] to cancel referral ordered on 02/27/22.  Dr. Benay Spice is aware.

## 2022-03-02 ENCOUNTER — Encounter: Payer: Self-pay | Admitting: Oncology

## 2022-03-02 ENCOUNTER — Other Ambulatory Visit: Payer: Self-pay | Admitting: Nurse Practitioner

## 2022-03-02 DIAGNOSIS — D509 Iron deficiency anemia, unspecified: Secondary | ICD-10-CM

## 2022-03-03 ENCOUNTER — Inpatient Hospital Stay: Payer: 59

## 2022-03-03 ENCOUNTER — Encounter: Payer: Self-pay | Admitting: Nurse Practitioner

## 2022-03-03 ENCOUNTER — Inpatient Hospital Stay: Payer: 59 | Attending: Oncology | Admitting: Nurse Practitioner

## 2022-03-03 VITALS — BP 111/68 | HR 73 | Temp 97.5°F | Resp 18 | Ht 66.0 in | Wt 268.4 lb

## 2022-03-03 DIAGNOSIS — Z85038 Personal history of other malignant neoplasm of large intestine: Secondary | ICD-10-CM | POA: Insufficient documentation

## 2022-03-03 DIAGNOSIS — K648 Other hemorrhoids: Secondary | ICD-10-CM | POA: Diagnosis not present

## 2022-03-03 DIAGNOSIS — Z79899 Other long term (current) drug therapy: Secondary | ICD-10-CM | POA: Diagnosis not present

## 2022-03-03 DIAGNOSIS — E785 Hyperlipidemia, unspecified: Secondary | ICD-10-CM | POA: Insufficient documentation

## 2022-03-03 DIAGNOSIS — K219 Gastro-esophageal reflux disease without esophagitis: Secondary | ICD-10-CM | POA: Diagnosis not present

## 2022-03-03 DIAGNOSIS — D509 Iron deficiency anemia, unspecified: Secondary | ICD-10-CM | POA: Insufficient documentation

## 2022-03-03 DIAGNOSIS — I10 Essential (primary) hypertension: Secondary | ICD-10-CM | POA: Insufficient documentation

## 2022-03-03 DIAGNOSIS — C182 Malignant neoplasm of ascending colon: Secondary | ICD-10-CM

## 2022-03-03 DIAGNOSIS — K760 Fatty (change of) liver, not elsewhere classified: Secondary | ICD-10-CM | POA: Diagnosis not present

## 2022-03-03 LAB — CBC WITH DIFFERENTIAL (CANCER CENTER ONLY)
Abs Immature Granulocytes: 0.05 10*3/uL (ref 0.00–0.07)
Basophils Absolute: 0 10*3/uL (ref 0.0–0.1)
Basophils Relative: 0 %
Eosinophils Absolute: 0.2 10*3/uL (ref 0.0–0.5)
Eosinophils Relative: 3 %
HCT: 35.4 % — ABNORMAL LOW (ref 39.0–52.0)
Hemoglobin: 9.7 g/dL — ABNORMAL LOW (ref 13.0–17.0)
Immature Granulocytes: 1 %
Lymphocytes Relative: 14 %
Lymphs Abs: 1 10*3/uL (ref 0.7–4.0)
MCH: 20.3 pg — ABNORMAL LOW (ref 26.0–34.0)
MCHC: 27.4 g/dL — ABNORMAL LOW (ref 30.0–36.0)
MCV: 73.9 fL — ABNORMAL LOW (ref 80.0–100.0)
Monocytes Absolute: 0.5 10*3/uL (ref 0.1–1.0)
Monocytes Relative: 8 %
Neutro Abs: 5.2 10*3/uL (ref 1.7–7.7)
Neutrophils Relative %: 74 %
Platelet Count: 366 10*3/uL (ref 150–400)
RBC: 4.79 MIL/uL (ref 4.22–5.81)
RDW: 22.5 % — ABNORMAL HIGH (ref 11.5–15.5)
WBC Count: 7 10*3/uL (ref 4.0–10.5)
nRBC: 0 % (ref 0.0–0.2)

## 2022-03-03 LAB — FERRITIN: Ferritin: 15 ng/mL — ABNORMAL LOW (ref 24–336)

## 2022-03-03 MED ORDER — SODIUM CHLORIDE 0.9 % IV SOLN: Freq: Once | INTRAVENOUS | Status: AC

## 2022-03-03 MED ORDER — SODIUM CHLORIDE 0.9 % IV SOLN
300.0000 mg | Freq: Once | INTRAVENOUS | Status: AC
  Administered 2022-03-03: 300 mg via INTRAVENOUS
  Filled 2022-03-03: qty 15

## 2022-03-03 NOTE — Patient Instructions (Signed)

## 2022-03-03 NOTE — Progress Notes (Signed)
Anthony Calhoun OFFICE PROGRESS NOTE   Diagnosis: Colon cancer  INTERVAL HISTORY:   Anthony Calhoun returns as scheduled.  He continues to note significant soreness on the tongue.  The soreness affects his ability to eat.  [place] mucosa is not affected.  He has discontinued oral iron yesterday in anticipation of the capsule endoscopy.  Bowels are moving.  He notes stools are black.  He has mild nausea intermittently.  He has had a "dry cough" for the past several weeks.  No change in baseline dyspnea on exertion.  No chest pain.  No leg swelling.  No fever.  Objective:  Vital signs in last 24 hours:  Temperature 97.3, heart rate 84, respirations 18, blood pressure 131/79 oxygen saturation 97% room air    HEENT: Tongue without apparent abnormality.  No thrush. Resp: Lungs clear bilaterally. Cardio: Regular rate and rhythm. GI: No hepatosplenomegaly. Vascular: No leg edema.   Lab Results:  Lab Results  Component Value Date   WBC 7.0 03/03/2022   HGB 9.7 (L) 03/03/2022   HCT 35.4 (L) 03/03/2022   MCV 73.9 (L) 03/03/2022   PLT 366 03/03/2022   NEUTROABS 5.2 03/03/2022    Imaging:  No results found.  Medications: I have reviewed the patient's current medications.  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 secondary to nausea (patient discontinued Xeloda on day 11 due to colonoscopy) Colonoscopy 07/09/2020-nodular mucosa at the colonic anastomosis, biopsied; patent end-to-side  ileocolonic anastomosis characterized by healthy-appearing mucosa and visible sutures; nonbleeding internal hemorrhoids, biopsy with benign ulcerated anastomotic mucosa with granulation tissue Cycle 4 adjuvant Xeloda 07/19/2020, 2000 mg every morning and 1500 mg every afternoon Cycle 5 adjuvant Xeloda 08/09/2020 Cycle 6 adjuvant Xeloda 08/31/2019 Cycle 7 adjuvant Xeloda 09/20/2020 Cycle 8 adjuvant Xeloda 10/11/2020 CTs 03/07/2021-no evidence of recurrent disease, hepatic steatosis, slight wall thickening at the junction of the descending and horizontal duodenum Mild elevation of CEA 2023 12/01/2021-Guardant Reveal-ct DNA detected PET 12/30/2021-hypermetabolic right liver lesion, hypermetabolic area in a loop of mid small bowel with an SUV of 12.5, review of PET images at GI tumor conference 01/11/2022-small bowel uptake felt to be a benign finding MRI liver 01/10/2022-solitary 1.2 cm right liver lesion between segments 7 and 8 compatible with metastasis, subtle changes suggesting cirrhosis, hepatic steatosis 02/08/2022-biopsy and ablation of solitary liver lesion, adenocarcinoma consistent with metastatic colorectal adenocarcinoma Microcytic anemia secondary to #1-improved Colon polyps-sessile serrated adenoma of the descending colon, tubular adenomas with focal high-grade dysplasia of the transverse colon on colonoscopy 02/12/2020, tubular adenoma of the mid ascending colon on the surgical specimen 04/12/2020 Family history of colon cancer Depression Anxiety/panic attacks Hypertension Hyperlipidemia Nausea secondary to Xeloda?-Resolved Gastroesophageal reflux disease-improved following discontinuation of Xeloda Anemia, microcytic; ferritin 6 01/24/2022; stool positive for blood x3 01/25/2022; 02/02/2022 CT abdomen/pelvis enterography-bowel loop demonstrating hypermetabolic activity continues to have a bandlike density along its margin possibly representing a diverticulum or adjacent nodal tissue, difficult to  exclude small bowel tumor, 3 x 4 mm left omental nodule or lymph node, known right hepatic lobe tumor is occult on CT. Venofer weekly x3 beginning 03/03/2022  Disposition: Anthony Calhoun appears stable.  Capsule endoscopy scheduled for next week.  He has persistent iron deficiency anemia.  He would like to proceed with a trial of IV iron.  We discussed the risk of an allergic/anaphylactic reaction.  He agrees to proceed.  Plan for Venofer weekly x3 beginning today.    He has persistent tongue soreness.  He understands this may or may not be related to the iron deficiency.  We will see him in follow-up in 2 to 3 weeks.  He will contact the office in the interim with any problems.  We specifically discussed worsening cough, shortness of breath.     Anthony Calhoun ANP/GNP-BC   03/03/2022  12:34 PM

## 2022-03-06 ENCOUNTER — Encounter: Payer: Self-pay | Admitting: Oncology

## 2022-03-07 ENCOUNTER — Other Ambulatory Visit: Payer: 59

## 2022-03-07 ENCOUNTER — Encounter: Payer: Self-pay | Admitting: *Deleted

## 2022-03-07 ENCOUNTER — Ambulatory Visit
Admission: RE | Admit: 2022-03-07 | Discharge: 2022-03-07 | Disposition: A | Discharge status: Home / Self Care | Payer: 59 | Source: Ambulatory Visit | Attending: Radiology | Admitting: Radiology

## 2022-03-07 DIAGNOSIS — C189 Malignant neoplasm of colon, unspecified: Secondary | ICD-10-CM

## 2022-03-07 HISTORY — PX: IR RADIOLOGIST EVAL & MGMT: IMG5224

## 2022-03-07 NOTE — Progress Notes (Signed)
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    Referring Physician(s): Michaelle Birks  History of Present Illness: Anthony Calhoun is a 56 y.o. 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  Subsequent surveillance imaging demonstrated no evidence of recurrent disease.  However, CEA was mildly elevated and continued to rise in early 2023.  In April 2023 Guardant Reveal DNA testing demonstrated circulating adenocarcinoma DNA.  Therefore, PET/CT was performed in May 2023 identifying a solitary hypermetabolic right liver lesion.  Subsequently, MRI was performed also in May 2023 identifying a solitary 1.2 cm right liver lesion position between segment 7 and 8 with imaging features most compatible with metastasis.  Patient does have hepatic steatosis and subtle changes of cirrhosis.  Hepatocellular carcinoma is a consideration but considered less likely given the imaging features.   He is essentially asymptomatic although he does have some mild fatigue.  He explains to me that he has a long history of OCD and anxiety.  He presents with his fiance and is also recording the conversation so that he will be able to listen to it again and not forget anything.  He has already seen   Dr. Michaelle Birks felt that he was a suboptimal surgical candidate due to his underlying steatosis and possible early cirrhosis.  He was deemed a good candidate for percutaneous thermal ablation which he underwent on 02/07/2022.  The procedure was uneventful and he was discharged home the next day.  Biopsy was performed which confirmed adenocarcinoma of probable colonic primary origin.  We spoke over the phone today in follow-up.  He reports that he has bounced back  quickly from his procedure and has no pain, nausea, vomiting or other systemic symptoms related to the ablation procedure.  He continues to have some fatigue due to his underlying anemia.  He is getting iron transfusions which make him feel somewhat sick but he hopes they will be helpful at improving his hemoglobin.  Past Medical History:  Diagnosis Date   Anemia    Anxiety    Arthritis    back,    Cancer ([place])    cecum   Depression    Diabetes mellitus without complication ([place])    Dyspnea    started with anemia   GERD (gastroesophageal reflux disease)    History of kidney stones    Hypertension    Neuromuscular disorder ([place])    carpal tunnel    Past Surgical History:  Procedure Laterality Date   APPENDECTOMY     COLON SURGERY     COLONOSCOPY     CYSTOSCOPY W/ URETEROSCOPY W/ LITHOTRIPSY  1981   IR RADIOLOGIST EVAL & MGMT  01/17/2022   RADIOLOGY WITH ANESTHESIA N/A 02/08/2022   Procedure: CT MICROWAVE ABLATION;  Surgeon: Criselda Peaches, MD;  Location: [facility];  Service: Radiology;  Laterality: N/A;    Allergies: Chlorthalidone, Cyclobenzaprine, Gabapentin, Losartan potassium-hctz, and Prednisone  Medications: Prior to Admission medications   Medication Sig Start Date End Date Taking? Authorizing Provider  ALPRAZolam Duanne Moron) 1 MG tablet Take one tablet three times daily as needed for anxiety. 12/09/21   Mozingo, Berdie Ogren, NP  amLODipine (NORVASC) 5 MG tablet Take 5 mg by mouth daily. 03/11/20   [provider]  atorvastatin (LIPITOR) 40 MG  tablet Take 40 mg by mouth daily. 02/18/20   [provider]  carvedilol (COREG) 6.25 MG tablet Take 6.25 mg by mouth 2 (two) times daily with a meal. 10/01/19   [provider]  docusate sodium (COLACE) 100 MG capsule Take 1 capsule (100 mg total) by mouth 2 (two) times daily. 02/09/22   Allred, Darrell K, PA-C  ferrous sulfate 325 (65 FE) MG EC tablet Take 1 tablet (325 mg total) by mouth 3 (three) times  daily with meals. 02/15/22   Ladell Pier, MD  magic mouthwash SOLN Take 5 mLs by mouth 4 (four) times daily as needed for mouth pain (swish for 1 minute and then spit for mouth pain). 01/31/22   Ladell Pier, MD  omeprazole (PRILOSEC) 20 MG capsule 1 capsule 30 minutes before morning meal    [provider]  ondansetron (ZOFRAN) 8 MG tablet Take 1 tablet (8 mg total) by mouth every 8 (eight) hours as needed for nausea. 01/31/22   Ladell Pier, MD  oxyCODONE (ROXICODONE) 5 MG immediate release tablet Take 1 tablet (5 mg total) by mouth every 6 (six) hours as needed for moderate pain. Patient not taking: Reported on 02/23/2022 02/09/22   Allred, Darrell K, PA-C  promethazine (PHENERGAN) 12.5 MG tablet Take 1 tablet (12.5 mg total) by mouth every 6 (six) hours as needed for nausea. 01/26/22   Ladell Pier, MD  sertraline (ZOLOFT) 100 MG tablet TAKE 2 TABLETS BY MOUTH EVERY DAY 12/09/21   Mozingo, Berdie Ogren, NP  triamcinolone (KENALOG) 0.1 % paste Use as directed 1 application. in the mouth or throat 2 (two) times daily. 01/17/22   [provider]     No family history on file.  Social History   Socioeconomic History   Marital status: Divorced    Spouse name: Not on file   Number of children: Not on file   Years of education: Not on file   Highest education level: Not on file  Occupational History   Not on file  Tobacco Use   Smoking status: Never   Smokeless tobacco: Never  Vaping Use   Vaping Use: Never used  Substance and Sexual Activity   Alcohol use: No   Drug use: No   Sexual activity: Not on file  Other Topics Concern   Not on file  Social History Narrative   Not on file   Social Determinants of Health   Financial Resource Strain: Medium Risk (01/17/2022)   Overall Financial Resource Strain (CARDIA)    Difficulty of Paying Living Expenses: Somewhat hard  Food Insecurity: Food Insecurity Present (01/17/2022)   Hunger Vital Sign    Worried  About Running Out of Food in the Last Year: Sometimes true    Ran Out of Food in the Last Year: Never true  Transportation Needs: No Transportation Needs (01/17/2022)   PRAPARE - Hydrologist (Medical): No    Lack of Transportation (Non-Medical): No  Physical Activity: Inactive (01/17/2022)   Exercise Vital Sign    Days of Exercise per Week: 0 days    Minutes of Exercise per Session: 0 min  Stress: Stress Concern Present (01/17/2022)   [place]    Feeling of Stress : Rather much  Social Connections: Socially Isolated (01/17/2022)   Social Connection and Isolation Panel [NHANES]    Frequency of Communication with Friends and Family: Three times a week  Frequency of Social Gatherings with Friends and Family: Three times a week    Attends Religious Services: Never    Active Member of Clubs or Organizations: No    Attends Archivist Meetings: Never    Marital Status: Divorced    ECOG Status: 1 - Symptomatic but completely ambulatory  Review of Systems  Review of Systems: A 12 point ROS discussed and pertinent positives are indicated in the HPI above.  All other systems are negative.    Physical Exam No direct physical exam was performed (except for noted visual exam findings with Video Visits).    Vital Signs: There were no vitals taken for this visit.  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 He presents for image guided biopsy and concurrent ablation. EXAM: CT and ultrasound-guided core biopsy CT and ultrasound-guided percutaneous thermal ablation COMPARISON:  None Available. MEDICATIONS: 2 g cefoxitin; The antibiotic was administered in an appropriate time interval prior to needle puncture of the skin. ANESTHESIA/SEDATION: General - as administered by the Anesthesia department  FLUOROSCOPY: None. COMPLICATIONS: None immediate. TECHNIQUE: Informed written consent was obtained from the patient after a thorough discussion of the procedural risks, benefits and alternatives. All questions were addressed. Maximal Sterile Barrier Technique was utilized including caps, mask, sterile gowns, sterile gloves, sterile drape, hand hygiene and skin antiseptic. A timeout was performed prior to the initiation of the procedure. CT imaging was performed. The lesion is occult on noncontrast enhanced CT imaging. Ultrasound was performed. The lesion is identified as a hypoechoic solid nodule on a background of echogenic steatosis. A suitable skin entry site was selected and marked. The overlying skin was sterilely prepped and draped in the standard fashion using chlorhexidine skin prep. Local anesthesia was attained by infiltration with 1% lidocaine. A small dermatotomy was made. Under real-time ultrasound guidance, a 17 gauge introducer needle was advanced through the liver and positioned at the margin of the lesion. A core biopsy was obtained, deposited in formalin and delivered to pathology for further analysis. The introducer needle was removed. Next, a 15 cm PR XT microwave ablation antenna was carefully advanced through the liver and through the lesion. Percutaneous thermal ablation was then performed. The probe was powered at 65 watts and ablation was performed for a total of 6 minutes. The ablation zone was monitored both with ultrasound and CT imaging. Following ablation, the probe was removed and the transhepatic tract ablated. Follow-up CT imaging with intravenous contrast demonstrates an excellent ablation zone that appears to encompass the expected location of the mass lesion. No evidence of immediate complication. FINDINGS: Technically successful biopsy and thermal ablation. IMPRESSION: 1. Technically successful image guided core biopsy of hepatic lesion. 2. Technically successful image guided  percutaneous microwave ablation of presumed metastatic lesion to the liver. Electronically Signed   By: Jacqulynn Cadet M.D.   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 He presents for image guided biopsy and concurrent ablation. EXAM: CT and ultrasound-guided core biopsy CT and ultrasound-guided percutaneous thermal ablation COMPARISON:  None Available. MEDICATIONS: 2 g cefoxitin; The antibiotic was administered in an appropriate time interval prior to needle puncture of the skin. ANESTHESIA/SEDATION: General - as administered by the Anesthesia department FLUOROSCOPY: None. COMPLICATIONS: None immediate. TECHNIQUE: Informed written consent was obtained from the patient after a thorough discussion of the procedural risks, benefits and alternatives. All  questions were addressed. Maximal Sterile Barrier Technique was utilized including caps, mask, sterile gowns, sterile gloves, sterile drape, hand hygiene and skin antiseptic. A timeout was performed prior to the initiation of the procedure. CT imaging was performed. The lesion is occult on noncontrast enhanced CT imaging. Ultrasound was performed. The lesion is identified as a hypoechoic solid nodule on a background of echogenic steatosis. A suitable skin entry site was selected and marked. The overlying skin was sterilely prepped and draped in the standard fashion using chlorhexidine skin prep. Local anesthesia was attained by infiltration with 1% lidocaine. A small dermatotomy was made. Under real-time ultrasound guidance, a 17 gauge introducer needle was advanced through the liver and positioned at the margin of the lesion. A core biopsy was obtained, deposited in formalin and delivered to pathology for further analysis. The introducer needle was removed. Next, a 15 cm PR XT microwave ablation antenna was carefully advanced through the liver and through the lesion.  Percutaneous thermal ablation was then performed. The probe was powered at 65 watts and ablation was performed for a total of 6 minutes. The ablation zone was monitored both with ultrasound and CT imaging. Following ablation, the probe was removed and the transhepatic tract ablated. Follow-up CT imaging with intravenous contrast demonstrates an excellent ablation zone that appears to encompass the expected location of the mass lesion. No evidence of immediate complication. FINDINGS: Technically successful biopsy and thermal ablation. IMPRESSION: 1. Technically successful image guided core biopsy of hepatic lesion. 2. Technically successful image guided percutaneous microwave ablation of presumed metastatic lesion to the liver. Electronically Signed   By: Jacqulynn Cadet M.D.   On: 02/08/2022 16:46   DG Chest 1 View  Result Date: 02/08/2022 CLINICAL DATA:  Preprocedural radiograph EXAM: CHEST  1 VIEW COMPARISON:  Chest x-ray dated November thirtieth 2017 FINDINGS: The heart size and mediastinal contours are within normal limits. Both lungs are clear. The visualized skeletal structures are unremarkable. IMPRESSION: No active disease. Electronically Signed   By: Yetta Glassman M.D.   On: 02/08/2022 13:02    Labs:  CBC: Recent Labs    02/09/22 0435 02/14/22 0918 02/23/22 0758 03/03/22 1138  WBC 11.8* 8.5 7.4 7.0  HGB 8.4* 9.1* 10.1* 9.7*  HCT 30.2* 33.5* 37.1* 35.4*  PLT 374 403* 384 366    COAGS: Recent Labs    02/08/22 1250  INR 1.0    BMP: Recent Labs    01/30/22 1314 02/08/22 1250 02/23/22 0758  NA 137 138 141  K 3.5 3.5 4.8  CL 107 108 102  CO2 '25 24 28  '$ GLUCOSE 180* 151* 136*  BUN '14 14 10  '$ CALCIUM 8.4* 8.4* 9.1  CREATININE 0.84 0.78 0.89  GFRNONAA >60 >60 >60    LIVER FUNCTION TESTS: Recent Labs    02/08/22 1250  BILITOT 0.6  AST 25  ALT 20  ALKPHOS 93  PROT 7.3  ALBUMIN 3.6    TUMOR MARKERS: Recent Labs    09/26/21 1507 10/31/21 0846 12/01/21 0837  02/23/22 0758  CEA 5.42* 5.77* 6.78* 7.72*    Assessment and Plan:  Pleasant 56 year old gentleman with colon adenocarcinoma metastatic to the liver.  He is doing extremely well 3 weeks status post biopsy and percutaneous thermal ablation of the liver lesion.  Biopsy results were positive for adenocarcinoma consistent with colorectal primary.  He has completely recovered from his procedure and has no active complaints at this time.  We will continue imaging surveillance as planned.  1.)  Gadolinium  enhanced liver MRI at 3 weeks post procedure (September 2023) with accompanying clinic visit.     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    Visit type: Audio only (telephone). Audio (no video) only due to patient preference. Alternative for in-person consultation at [facility], [address], [place], [place]. This visit type was conducted due to national recommendations for restrictions regarding the COVID-19 Pandemic (e.g. social distancing).  This format is felt to be most appropriate for this patient at this time.  All issues noted in this document were discussed and addressed.

## 2022-03-08 ENCOUNTER — Encounter: Payer: Self-pay | Admitting: Oncology

## 2022-03-08 ENCOUNTER — Encounter: Payer: Self-pay | Admitting: *Deleted

## 2022-03-10 ENCOUNTER — Other Ambulatory Visit: Payer: Self-pay | Admitting: Gastroenterology

## 2022-03-10 ENCOUNTER — Encounter: Payer: Self-pay | Admitting: Oncology

## 2022-03-10 ENCOUNTER — Encounter: Payer: Self-pay | Admitting: *Deleted

## 2022-03-10 ENCOUNTER — Ambulatory Visit
Admission: RE | Admit: 2022-03-10 | Discharge: 2022-03-10 | Disposition: A | Discharge status: Home / Self Care | Payer: 59 | Source: Ambulatory Visit | Attending: Gastroenterology | Admitting: Gastroenterology

## 2022-03-10 ENCOUNTER — Inpatient Hospital Stay: Payer: 59

## 2022-03-10 VITALS — BP 119/72 | HR 88 | Temp 98.8°F | Resp 18 | Ht 66.0 in | Wt 266.0 lb

## 2022-03-10 DIAGNOSIS — T184XXD Foreign body in colon, subsequent encounter: Secondary | ICD-10-CM

## 2022-03-10 DIAGNOSIS — D509 Iron deficiency anemia, unspecified: Secondary | ICD-10-CM

## 2022-03-10 MED ORDER — SODIUM CHLORIDE 0.9 % IV SOLN: Freq: Once | INTRAVENOUS | Status: AC

## 2022-03-10 MED ORDER — SODIUM CHLORIDE 0.9 % IV SOLN
300.0000 mg | Freq: Once | INTRAVENOUS | Status: AC
  Administered 2022-03-10: 300 mg via INTRAVENOUS
  Filled 2022-03-10: qty 15

## 2022-03-10 NOTE — Patient Instructions (Signed)

## 2022-03-10 NOTE — Progress Notes (Signed)
Call from Amy w/Dr. Encarnacion Slates office: capsule endoscopy showed no small bowel bleeding or lesions. The will schedule abdominal xray since they did not see the camera in his cecum. Dr. Benay Spice made aware and report sent by GI was faxed to Dr. Marcello Moores at [facility]. Dr. Benay Spice feels he needs to discuss surgery with Dr. Marcello Moores.

## 2022-03-10 NOTE — Progress Notes (Signed)
Patient had expressed concerns earlier in the day regarding his iron infusion via MyChart.  All questions and concerns were addressed during infusion appointment today.  Patient had complaints of symptoms that have been present prior to receiving iron infusions and had been previously addressed by Dr. Benay Spice. Patient had new symptoms to report during his infusion appointment today.   Towards end of infusion, patient stated he received a call from Dr. Encarnacion Slates office regarding his endoscopy that was done on 03/09/22.  Patient had questions regarding the results of the procedure.  Instructed patient to follow-up with Dr. Encarnacion Slates office as they had done the procedure.  Patient verbalized understanding and stated he would follow-up with Dr. Therisa Doyne.

## 2022-03-12 ENCOUNTER — Encounter: Payer: Self-pay | Admitting: Oncology

## 2022-03-13 ENCOUNTER — Encounter: Payer: Self-pay | Admitting: Oncology

## 2022-03-14 ENCOUNTER — Ambulatory Visit: Payer: 59 | Admitting: Urology

## 2022-03-15 ENCOUNTER — Ambulatory Visit: Payer: 59 | Admitting: Urology

## 2022-03-15 ENCOUNTER — Encounter: Payer: Self-pay | Admitting: Oncology

## 2022-03-15 ENCOUNTER — Encounter: Payer: Self-pay | Admitting: *Deleted

## 2022-03-16 ENCOUNTER — Encounter: Payer: Self-pay | Admitting: Oncology

## 2022-03-17 ENCOUNTER — Inpatient Hospital Stay (HOSPITAL_BASED_OUTPATIENT_CLINIC_OR_DEPARTMENT_OTHER): Payer: 59 | Admitting: Oncology

## 2022-03-17 ENCOUNTER — Encounter: Payer: Self-pay | Admitting: Oncology

## 2022-03-17 ENCOUNTER — Other Ambulatory Visit: Payer: Self-pay

## 2022-03-17 ENCOUNTER — Inpatient Hospital Stay: Payer: 59

## 2022-03-17 VITALS — BP 124/76 | HR 82 | Temp 98.8°F | Resp 16 | Ht 66.0 in | Wt 264.0 lb

## 2022-03-17 VITALS — BP 124/69 | HR 93 | Temp 98.2°F | Resp 18

## 2022-03-17 DIAGNOSIS — D509 Iron deficiency anemia, unspecified: Secondary | ICD-10-CM

## 2022-03-17 MED ORDER — SODIUM CHLORIDE 0.9 % IV SOLN
300.0000 mg | Freq: Once | INTRAVENOUS | Status: AC
  Administered 2022-03-17: 300 mg via INTRAVENOUS
  Filled 2022-03-17: qty 15

## 2022-03-17 MED ORDER — MAGIC MOUTHWASH: 5.0000 mL | Freq: Four times a day (QID) | ORAL | 1 refills | Status: DC | PRN

## 2022-03-17 MED ORDER — SODIUM CHLORIDE 0.9 % IV SOLN: Freq: Once | INTRAVENOUS | Status: AC

## 2022-03-17 NOTE — Patient Instructions (Signed)

## 2022-03-17 NOTE — Progress Notes (Signed)
Anthony Calhoun   Diagnosis: Colon cancer, iron deficiency anemia  INTERVAL HISTORY:   Anthony Calhoun completed treatment with Venofer on 03/03/2022 and 03/10/2022.  He is scheduled for another treatment today.  He underwent a camera endoscopy last week.  The small bowel appeared normal until 8 hours.  The pill did not extend into the cecum.  A plain x-ray did not reveal a foreign body in the abdomen.  He continues to have pain at the tip of the tongue despite Magic mouthwash, iron therapy, and triamcinolone paste.  The pain is at the tip of the tongue.  He has noted recent belching and mild dysphagia.  No bleeding.  He has noted a knot at the left side of the neck.  Objective:  Vital signs in last 24 hours:  There were no vitals taken for this visit.    HEENT: Oral cavity without thrush or ulcers.  No apparent abnormality over the tongue.,  1-1.5 cm soft mobile subcutaneous lesion at the left neck.  The lesion appears to be inferior to the left submandibular gland and superior to the thyroid. Lymphatics: No cervical or supraclavicular nodes Resp: Lungs clear bilaterally Cardio: Regular rate and rhythm GI: Nontender, no hepatosplenomegaly Vascular: No leg edema   Lab Results:  Lab Results  Component Value Date   WBC 7.0 03/03/2022   HGB 9.7 (L) 03/03/2022   HCT 35.4 (L) 03/03/2022   MCV 73.9 (L) 03/03/2022   PLT 366 03/03/2022   NEUTROABS 5.2 03/03/2022    CMP  Lab Results  Component Value Date   NA 141 02/23/2022   K 4.8 02/23/2022   CL 102 02/23/2022   CO2 28 02/23/2022   GLUCOSE 136 (H) 02/23/2022   BUN 10 02/23/2022   CREATININE 0.89 02/23/2022   CALCIUM 9.1 02/23/2022   PROT 7.3 02/08/2022   ALBUMIN 3.6 02/08/2022   AST 25 02/08/2022   ALT 20 02/08/2022   ALKPHOS 93 02/08/2022   BILITOT 0.6 02/08/2022   GFRNONAA >60 02/23/2022   GFRAA >60 05/10/2020    Lab Results  Component Value Date   CEA1 1.08 03/07/2021   CEA 7.72  (H) 02/23/2022    Lab Results  Component Value Date   INR 1.0 02/08/2022   LABPROT 13.5 02/08/2022    Imaging:  No results found.  Medications: I have reviewed the patient's current medications.   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 secondary to nausea (patient discontinued Xeloda on day 11 due to colonoscopy) Colonoscopy 07/09/2020-nodular mucosa at the colonic anastomosis, biopsied; patent end-to-side ileocolonic anastomosis characterized by healthy-appearing mucosa and visible sutures; nonbleeding internal hemorrhoids, biopsy with benign ulcerated anastomotic mucosa with granulation tissue Cycle 4 adjuvant Xeloda 07/19/2020, 2000 mg every morning and 1500 mg every afternoon Cycle 5 adjuvant Xeloda 08/09/2020 Cycle 6 adjuvant Xeloda 08/31/2019 Cycle 7 adjuvant Xeloda 09/20/2020 Cycle 8 adjuvant Xeloda 10/11/2020 CTs 03/07/2021-no evidence of recurrent disease, hepatic steatosis, slight wall thickening at the junction of the descending and horizontal duodenum Mild elevation of CEA 2023 12/01/2021-Guardant Reveal-ct DNA detected PET 12/30/2021-hypermetabolic right liver lesion,  hypermetabolic area in a loop of mid small bowel with an SUV of 12.5, review of PET images at GI tumor conference 01/11/2022-small bowel uptake felt to be a benign finding MRI liver 01/10/2022-solitary 1.2 cm right liver lesion between segments 7 and 8 compatible with metastasis, subtle changes suggesting cirrhosis, hepatic  steatosis CT enteroscopy 02/02/2022-small bowel loop with hypermetabolic activity on PET continues to have a bandlike density along the margin felt to represent a diverticulum or adjacent nodal tissue.  Small bowel tumor not excluded.  3-4 mm left omental nodule 02/08/2022-biopsy and ablation of solitary liver lesion, adenocarcinoma consistent with metastatic colorectal adenocarcinoma Microcytic anemia secondary to #1-improved Colon polyps-sessile serrated adenoma of the descending colon, tubular adenomas with focal high-grade dysplasia of the transverse colon on colonoscopy 02/12/2020, tubular adenoma of the mid ascending colon on the surgical specimen 04/12/2020 Family history of colon cancer Depression Anxiety/panic attacks Hypertension Hyperlipidemia Nausea secondary to Xeloda?-Resolved Gastroesophageal reflux disease-improved following discontinuation of Xeloda Anemia, microcytic; ferritin 6 01/24/2022; stool positive for blood x3 01/25/2022; 02/02/2022 CT abdomen/pelvis enterography-bowel loop demonstrating hypermetabolic activity continues to have a bandlike density along its margin possibly representing a diverticulum or adjacent nodal tissue, difficult to exclude small bowel tumor, 3 x 4 mm left omental nodule or lymph node, known right hepatic lobe tumor is occult on CT. Venofer weekly x3 beginning 03/03/2022 Negative capsule endoscopy 03/09/2022 12.  Anterior left neck nodule 03/17/2022-cyst?,  Lymph node?      Disposition: Anthony Calhoun has a history of metastatic colon cancer.  He underwent ablation of an isolated liver lesion 02/08/2022.  He has iron deficiency anemia with a Hemoccult positive stool.  There is an area of hypermetabolism involving a loop of mid small bowel.  A [place] endoscopy was negative.  He continues to have soreness of the tongue, limiting oral intake.  The etiology is unclear.  I suspect this is related to iron deficiency.  He will complete another treat with IV iron  today.  He will return for a CBC and ferritin level in 1 week. Dr. Thompson does not recommend surgical exploration for evaluation of the abnormal small bowel finding.  She recommends referral for a balloon endoscopy if GI symptoms and anemia persist.  The left neck nodule may be a lymph node or cyst.  We will make an ENT referral and consider a neck CT if this lesion persists.  Anthony Calhoun will return for an office visit in 1 week.  Gary Sherrill, MD  03/17/2022  12:49 PM   

## 2022-03-20 ENCOUNTER — Encounter: Payer: Self-pay | Admitting: Oncology

## 2022-03-21 ENCOUNTER — Encounter: Payer: Self-pay | Admitting: Oncology

## 2022-03-23 ENCOUNTER — Encounter: Payer: Self-pay | Admitting: Oncology

## 2022-03-23 ENCOUNTER — Other Ambulatory Visit: Payer: Self-pay

## 2022-03-23 MED ORDER — ONDANSETRON HCL 8 MG PO TABS: 8.0000 mg | ORAL_TABLET | Freq: Three times a day (TID) | ORAL | 1 refills | Status: DC | PRN

## 2022-03-23 NOTE — Telephone Encounter (Signed)
Pt left my chart message stating he was having nausea last night really bad. Asked Pt if he was using his anti nausea medication. Pt stated he ran out of zofran and is using his phenergan. Pt also stated he moved his bowels and that gave him some relief. Pt states his tongue is getting better but he does have some tender spots upper abdomen below his sternum. Pt has an appointment tomorrow.

## 2022-03-24 ENCOUNTER — Encounter: Payer: Self-pay | Admitting: Nurse Practitioner

## 2022-03-24 ENCOUNTER — Encounter: Payer: Self-pay | Admitting: *Deleted

## 2022-03-24 ENCOUNTER — Inpatient Hospital Stay: Payer: 59

## 2022-03-24 ENCOUNTER — Inpatient Hospital Stay (HOSPITAL_BASED_OUTPATIENT_CLINIC_OR_DEPARTMENT_OTHER): Payer: 59 | Admitting: Nurse Practitioner

## 2022-03-24 VITALS — BP 152/78 | HR 103 | Temp 97.9°F | Resp 18 | Ht 66.0 in | Wt 260.6 lb

## 2022-03-24 DIAGNOSIS — C182 Malignant neoplasm of ascending colon: Secondary | ICD-10-CM

## 2022-03-24 DIAGNOSIS — D509 Iron deficiency anemia, unspecified: Secondary | ICD-10-CM | POA: Diagnosis not present

## 2022-03-24 DIAGNOSIS — R1013 Epigastric pain: Secondary | ICD-10-CM

## 2022-03-24 DIAGNOSIS — R110 Nausea: Secondary | ICD-10-CM

## 2022-03-24 LAB — CBC WITH DIFFERENTIAL (CANCER CENTER ONLY)
Abs Immature Granulocytes: 0.02 10*3/uL (ref 0.00–0.07)
Basophils Absolute: 0 10*3/uL (ref 0.0–0.1)
Basophils Relative: 1 %
Eosinophils Absolute: 0.3 10*3/uL (ref 0.0–0.5)
Eosinophils Relative: 5 %
HCT: 40 % (ref 39.0–52.0)
Hemoglobin: 11.5 g/dL — ABNORMAL LOW (ref 13.0–17.0)
Immature Granulocytes: 0 %
Lymphocytes Relative: 21 %
Lymphs Abs: 1.2 10*3/uL (ref 0.7–4.0)
MCH: 22 pg — ABNORMAL LOW (ref 26.0–34.0)
MCHC: 28.8 g/dL — ABNORMAL LOW (ref 30.0–36.0)
MCV: 76.6 fL — ABNORMAL LOW (ref 80.0–100.0)
Monocytes Absolute: 0.4 10*3/uL (ref 0.1–1.0)
Monocytes Relative: 7 %
Neutro Abs: 3.9 10*3/uL (ref 1.7–7.7)
Neutrophils Relative %: 66 %
Platelet Count: 356 10*3/uL (ref 150–400)
RBC: 5.22 MIL/uL (ref 4.22–5.81)
RDW: 22.2 % — ABNORMAL HIGH (ref 11.5–15.5)
WBC Count: 5.9 10*3/uL (ref 4.0–10.5)
nRBC: 0 % (ref 0.0–0.2)

## 2022-03-24 LAB — CEA (ACCESS): CEA (CHCC): 8.14 ng/mL — ABNORMAL HIGH (ref 0.00–5.00)

## 2022-03-24 LAB — FERRITIN: Ferritin: 186 ng/mL (ref 24–336)

## 2022-03-24 NOTE — Progress Notes (Signed)
[facility] OFFICE PROGRESS NOTE   Diagnosis: Colon cancer, iron deficiency anemia  INTERVAL HISTORY:   Anthony Calhoun returns as scheduled.  No signs of allergic reaction following IV iron.  Tongue soreness is better.  He continues to note an alteration in taste.  He continues to have nausea.  The nausea tends to occur after eating but it can also occur regardless of oral intake.  He develops nausea after taking oral iron.  Phenergan and Zofran are partially effective.  No vomiting.  Bowels are moving.  He has developed pain at the upper abdomen.  Objective:  Vital signs in last 24 hours:  Blood pressure (!) 152/78, pulse (!) 103, temperature 97.9 F (36.6 C), temperature source Oral, resp. rate 18, height _0  (1.676 m), weight 260 lb 9.6 oz (118.2 kg), SpO2 97 %.    HEENT: No thrush or ulcers.  No palpable abnormality at the left neck. Resp: Lungs clear bilaterally. Cardio: Regular rate and rhythm. GI: Abdomen soft.  Tender epigastric region.  No hepatosplenomegaly. Vascular: No leg edema.   Lab Results:  Lab Results  Component Value Date   WBC 5.9 03/24/2022   HGB 11.5 (L) 03/24/2022   HCT 40.0 03/24/2022   MCV 76.6 (L) 03/24/2022   PLT 356 03/24/2022   NEUTROABS 3.9 03/24/2022    Imaging:  No results found.  Medications: I have reviewed the patient's current medications.  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 secondary to nausea (patient discontinued Xeloda on  day 11 due to colonoscopy) Colonoscopy 07/09/2020-nodular mucosa at the colonic anastomosis, biopsied; patent end-to-side ileocolonic anastomosis characterized by healthy-appearing mucosa and visible sutures; nonbleeding internal hemorrhoids, biopsy with benign ulcerated anastomotic mucosa with granulation tissue Cycle 4 adjuvant Xeloda 07/19/2020, 2000 mg every morning and 1500 mg every afternoon Cycle 5 adjuvant Xeloda 08/09/2020 Cycle 6 adjuvant Xeloda 08/31/2019 Cycle 7 adjuvant Xeloda 09/20/2020 Cycle 8 adjuvant Xeloda 10/11/2020 CTs 03/07/2021-no evidence of recurrent disease, hepatic steatosis, slight wall thickening at the junction of the descending and horizontal duodenum Mild elevation of CEA 2023 12/01/2021-Guardant Reveal-ct DNA detected PET 12/30/2021-hypermetabolic right liver lesion, hypermetabolic area in a loop of mid small bowel with an SUV of 12.5, review of PET images at GI tumor conference 01/11/2022-small bowel uptake felt to be a benign finding MRI liver 01/10/2022-solitary 1.2 cm right liver lesion between segments 7 and 8 compatible with metastasis, subtle changes suggesting cirrhosis, hepatic steatosis CT enteroscopy 02/02/2022-small bowel loop with hypermetabolic activity on PET continues to have a bandlike density along the margin felt to represent a diverticulum or adjacent nodal tissue.  Small bowel tumor not excluded.  3-4 mm left omental nodule 02/08/2022-biopsy and ablation of solitary liver lesion, adenocarcinoma consistent with metastatic colorectal adenocarcinoma Microcytic anemia secondary to #1-improved Colon polyps-sessile serrated adenoma of the descending colon, tubular adenomas with focal high-grade dysplasia of the transverse colon on colonoscopy 02/12/2020, tubular adenoma of the mid ascending colon on the surgical specimen 04/12/2020 Family history of colon cancer Depression Anxiety/panic attacks Hypertension Hyperlipidemia Nausea secondary to  Xeloda?-Resolved Gastroesophageal reflux disease-improved following discontinuation of Xeloda Anemia,  microcytic; ferritin 6 01/24/2022; stool positive for blood x3 01/25/2022; 02/02/2022 CT abdomen/pelvis enterography-bowel loop demonstrating hypermetabolic activity continues to have a bandlike density along its margin possibly representing a diverticulum or adjacent nodal tissue, difficult to exclude small bowel tumor, 3 x 4 mm left omental nodule or lymph node, known right hepatic lobe tumor is occult on CT. Venofer weekly x3 beginning 03/03/2022 Negative capsule endoscopy 03/09/2022 12.  Anterior left neck nodule 03/17/2022-cyst?,  Lymph node?  Disposition: Anthony Calhoun appears unchanged.  He is having nausea and upper abdominal discomfort.  We are referring him for repeat CTs abdomen/pelvis.  He will focus on a soft diet for now.  He will discontinue oral iron to see if this helps with the nausea.  He has Zofran and Phenergan to take as needed.  We discussed the possibility of referral for balloon endoscopy.  The hemoglobin has partially corrected and ferritin is now in normal range following IV iron.  Tongue soreness is better.  He will return for follow-up visit 03/30/2022.  He will contact the office in the interim with any problems.  We specifically discussed nausea/vomiting, inability to maintain adequate hydration.  Patient seen with Dr. Benay Spice.  Ned Card ANP/GNP-BC   03/24/2022  2:05 PM This was a shared visit with Ned Card.  Anthony Calhoun was interviewed and examined.  The anterior left neck nodule appears smaller. He continues to have upper abdominal discomfort and nausea.  I am concerned this is related to progression of the small bowel thickening noted on recent imaging.  We will refer him for a restaging CT.  His insurance will change next week.  Hopefully this will allow [place] to refer him for a balloon endoscopy.  I was present for greater than 50% of today's visit.  I performed  medical decision making.  Julieanne Manson, MD

## 2022-03-25 LAB — VITAMIN B12: Vitamin B-12: 294 pg/mL (ref 180–914)

## 2022-03-25 LAB — CALCITRIOL (1,25 DI-OH VIT D): Vit D, 1,25-Dihydroxy: 55.9 pg/mL (ref 24.8–81.5)

## 2022-03-27 ENCOUNTER — Encounter: Payer: Self-pay | Admitting: Oncology

## 2022-03-28 ENCOUNTER — Other Ambulatory Visit (HOSPITAL_BASED_OUTPATIENT_CLINIC_OR_DEPARTMENT_OTHER): Payer: 59

## 2022-03-28 ENCOUNTER — Encounter: Payer: Self-pay | Admitting: Oncology

## 2022-03-28 ENCOUNTER — Telehealth: Payer: Self-pay

## 2022-03-28 ENCOUNTER — Encounter: Payer: Self-pay | Admitting: Nurse Practitioner

## 2022-03-28 NOTE — Telephone Encounter (Signed)
My chart message  form Anthony Calhoun with radiology department stating Pt would like a telephone call. Informed Anthony Calhoun I have been on my chart with this Pt all day in reference to the situation with his insurance and his CT scan. Pt always corresponds using my chart message. TC to Pt. Pt inquiring about the same matter we discussed on my chart message. Pt concerned about CEA results which he was informed in several messages that Dr Benay Spice will give him his answer once we get the CT scan results. Informed Pt he can pay out of pocket for scan or wait till the approval of the CT scan. Pt declined to pay out of pocket. Pt asking if this was an emergent matter that the scan needed to be done. Informed Pt that scan was not a stat scan and he can wait for the approval from his insurance. Pt verbalized understanding. No further problems or concerns noted.

## 2022-03-29 ENCOUNTER — Ambulatory Visit (HOSPITAL_BASED_OUTPATIENT_CLINIC_OR_DEPARTMENT_OTHER): Admission: RE | Admit: 2022-03-29 | Payer: Commercial Managed Care - HMO | Source: Ambulatory Visit

## 2022-03-29 ENCOUNTER — Encounter: Payer: Self-pay | Admitting: Oncology

## 2022-03-29 ENCOUNTER — Encounter: Payer: Self-pay | Admitting: Nurse Practitioner

## 2022-03-30 ENCOUNTER — Inpatient Hospital Stay: Payer: Commercial Managed Care - HMO | Admitting: Oncology

## 2022-03-30 ENCOUNTER — Other Ambulatory Visit (HOSPITAL_BASED_OUTPATIENT_CLINIC_OR_DEPARTMENT_OTHER): Payer: 59

## 2022-03-31 ENCOUNTER — Other Ambulatory Visit: Payer: Self-pay | Admitting: *Deleted

## 2022-03-31 ENCOUNTER — Inpatient Hospital Stay: Payer: Commercial Managed Care - HMO | Attending: Oncology | Admitting: Nurse Practitioner

## 2022-03-31 ENCOUNTER — Ambulatory Visit (HOSPITAL_BASED_OUTPATIENT_CLINIC_OR_DEPARTMENT_OTHER): Payer: Commercial Managed Care - HMO

## 2022-03-31 ENCOUNTER — Encounter: Payer: Self-pay | Admitting: Nurse Practitioner

## 2022-03-31 ENCOUNTER — Ambulatory Visit (HOSPITAL_BASED_OUTPATIENT_CLINIC_OR_DEPARTMENT_OTHER)
Admission: RE | Admit: 2022-03-31 | Discharge: 2022-03-31 | Disposition: A | Discharge status: Home / Self Care | Payer: Commercial Managed Care - HMO | Source: Ambulatory Visit | Attending: Nurse Practitioner | Admitting: Nurse Practitioner

## 2022-03-31 VITALS — BP 144/80 | HR 100 | Temp 98.2°F | Resp 18 | Ht 66.0 in | Wt 264.3 lb

## 2022-03-31 DIAGNOSIS — R1013 Epigastric pain: Secondary | ICD-10-CM | POA: Diagnosis present

## 2022-03-31 DIAGNOSIS — D509 Iron deficiency anemia, unspecified: Secondary | ICD-10-CM | POA: Diagnosis present

## 2022-03-31 DIAGNOSIS — R110 Nausea: Secondary | ICD-10-CM | POA: Insufficient documentation

## 2022-03-31 DIAGNOSIS — C182 Malignant neoplasm of ascending colon: Secondary | ICD-10-CM | POA: Insufficient documentation

## 2022-03-31 MED ORDER — IOHEXOL 300 MG/ML  SOLN
100.0000 mL | Freq: Once | INTRAMUSCULAR | Status: AC | PRN
  Administered 2022-03-31: 100 mL via INTRAVENOUS

## 2022-03-31 NOTE — Progress Notes (Signed)
[facility] OFFICE PROGRESS NOTE   Diagnosis: Colon cancer, iron deficiency anemia  INTERVAL HISTORY:   Anthony Calhoun returns as scheduled.  Tongue soreness continues to be improved.  He notes an alteration in taste.  Less nausea since discontinuing oral iron.  Bowels are moving.  No bleeding.  He denies abdominal pain.  He has dyspnea with "overexertion".  He continues to cough intermittently.  Last night he felt "hot and clammy", no fever.  Objective:  Vital signs in last 24 hours:  Blood pressure (!) 144/80, pulse 100, temperature 98.2 F (36.8 C), temperature source Oral, resp. rate 18, height 5' 6" (1.676 m), weight 264 lb 4.8 oz (119.9 kg), SpO2 98 %.    HEENT: No thrush or ulcers. Resp: Lungs clear bilaterally. Cardio: Regular rate and rhythm. GI: No hepatosplenomegaly.  No mass. Vascular: No leg edema.   Lab Results:  Lab Results  Component Value Date   WBC 5.9 03/24/2022   HGB 11.5 (L) 03/24/2022   HCT 40.0 03/24/2022   MCV 76.6 (L) 03/24/2022   PLT 356 03/24/2022   NEUTROABS 3.9 03/24/2022    Imaging:  No results found.  Medications: I have reviewed the patient's current medications.  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 secondary to nausea (patient discontinued Xeloda on day 11 due to colonoscopy) Colonoscopy 07/09/2020-nodular mucosa at the colonic anastomosis, biopsied; patent end-to-side ileocolonic anastomosis characterized by healthy-appearing mucosa  and visible sutures; nonbleeding internal hemorrhoids, biopsy with benign ulcerated anastomotic mucosa with granulation tissue Cycle 4 adjuvant Xeloda 07/19/2020, 2000 mg every morning and 1500 mg every afternoon Cycle 5 adjuvant Xeloda 08/09/2020 Cycle 6 adjuvant Xeloda 08/31/2019 Cycle 7 adjuvant Xeloda 09/20/2020 Cycle 8 adjuvant Xeloda 10/11/2020 CTs 03/07/2021-no evidence of recurrent disease, hepatic steatosis, slight wall thickening at the junction of the descending and horizontal duodenum Mild elevation of CEA 2023 12/01/2021-Guardant Reveal-ct DNA detected PET 12/30/2021-hypermetabolic right liver lesion, hypermetabolic area in a loop of mid small bowel with an SUV of 12.5, review of PET images at GI tumor conference 01/11/2022-small bowel uptake felt to be a benign finding MRI liver 01/10/2022-solitary 1.2 cm right liver lesion between segments 7 and 8 compatible with metastasis, subtle changes suggesting cirrhosis, hepatic steatosis CT enteroscopy 02/02/2022-small bowel loop with hypermetabolic activity on PET continues to have a bandlike density along the margin felt to represent a diverticulum or adjacent nodal tissue.  Small bowel tumor not excluded.  3-4 mm left omental nodule 02/08/2022-biopsy and ablation of solitary liver lesion, adenocarcinoma consistent with metastatic colorectal adenocarcinoma CTs 03/31/2022-pending Microcytic anemia secondary to #1-improved Colon polyps-sessile serrated adenoma of the descending colon, tubular adenomas with focal high-grade dysplasia of the transverse colon on colonoscopy 02/12/2020, tubular adenoma of the mid ascending colon on the surgical specimen 04/12/2020 Family history of colon cancer Depression Anxiety/panic attacks Hypertension Hyperlipidemia Nausea secondary to Xeloda?-Resolved Gastroesophageal reflux disease-improved following discontinuation of Xeloda Anemia, microcytic; ferritin 6 01/24/2022; stool positive for blood x3 01/25/2022; 02/02/2022 CT  abdomen/pelvis enterography-bowel loop demonstrating hypermetabolic activity continues to have a bandlike density along its margin possibly representing a diverticulum or adjacent   nodal tissue, difficult to exclude small bowel tumor, 3 x 4 mm left omental nodule or lymph node, known right hepatic lobe tumor is occult on CT. Venofer weekly x3 beginning 03/03/2022 Negative capsule endoscopy 03/09/2022 12.  Anterior left neck nodule 03/17/2022-cyst?,  Lymph node?  Disposition: Anthony Calhoun appears stable.  Overall he feels better.  He had CT scans earlier today.  The final report has not been issued.  We will contact him once this is available.  Referral made to [facility] for a double-balloon endoscopy procedure.  We scheduled follow-up here in 3 weeks.  He will contact the office in the interim with any problems.  Patient seen with Dr. Sherrill.    Lisa Thomas ANP/GNP-BC   03/31/2022  2:29 PM This was a shared visit with Lisa Thomas.  Anthony Calhoun was interviewed and examined.  His overall clinical status has improved over the past few weeks with improvement in the iron deficiency anemia and discontinuing oral iron.  We reviewed the CT images from today.  The final report is pending.  There is no obvious evidence of disease progression.  We will refer him to [facility] to consider further endoscopic evaluation of the abnormal small bowel noted on CT and PET.  I was present for greater than 50% of today's visit.  I performed medical decision making.  Brad Sherrill, MD       

## 2022-03-31 NOTE — Progress Notes (Signed)
Patient sent MyChart message very concerned with his persistent cough not responding to OTC cough meds and is asking for CT of chest be added to abdomen/pelvis today. OK to add per Marlynn Perking. Awaiting managed care to see if insurance will approve. He is asking about IV iron today and repeat CEA as well. This is not indicated due to normal iron level and just had CEA 03/24/22. CT scan approved and was able to be added to today. Patient notified.

## 2022-04-03 ENCOUNTER — Encounter: Payer: Self-pay | Admitting: Oncology

## 2022-04-03 ENCOUNTER — Telehealth: Payer: Self-pay

## 2022-04-03 ENCOUNTER — Encounter: Payer: Self-pay | Admitting: *Deleted

## 2022-04-03 NOTE — Telephone Encounter (Signed)
-----   Message from Ladell Pier, MD sent at 04/03/2022  2:08 PM EDT ----- Please call patient, the CT shows no evidence of progressive cancer, stable thickened area of small bowel, no other evidence of cancer  Plan to refer him to [facility] to consider an endoscopy

## 2022-04-03 NOTE — Telephone Encounter (Signed)
TC and my chart message sent to Pt with results. Pt verbalized understanding.

## 2022-04-03 NOTE — Progress Notes (Signed)
Faxed over referral order, demographics, imaging reports, med list and office note to [facility] [phone] as well as to clinic side at [phone]. Called to follow up at [phone] and was informed it will take at least 24 hours to process the referral/request for double balloon endoscopy.

## 2022-04-04 ENCOUNTER — Encounter: Payer: Self-pay | Admitting: Oncology

## 2022-04-05 ENCOUNTER — Other Ambulatory Visit: Payer: Self-pay | Admitting: *Deleted

## 2022-04-05 ENCOUNTER — Encounter: Payer: Self-pay | Admitting: Oncology

## 2022-04-05 DIAGNOSIS — C182 Malignant neoplasm of ascending colon: Secondary | ICD-10-CM

## 2022-04-05 DIAGNOSIS — D509 Iron deficiency anemia, unspecified: Secondary | ICD-10-CM

## 2022-04-05 NOTE — Progress Notes (Signed)
Called [facility] to f/u on status of referral for double balloon endoscopy and was informed that they only perform single balloon. Patient needs to go to [facility] for double balloon. New referral placed per Dr. Benay Spice.  Call to [facility] and inquired via VM if they have a contact phone/fax at [facility] to send referral.

## 2022-04-06 ENCOUNTER — Telehealth: Payer: Self-pay

## 2022-04-06 ENCOUNTER — Encounter: Payer: Self-pay | Admitting: *Deleted

## 2022-04-06 ENCOUNTER — Encounter: Payer: Self-pay | Admitting: Oncology

## 2022-04-06 NOTE — Progress Notes (Signed)
Faxed referral order, demographics and chart information to [facility] [phone] for double balloon endoscopy.

## 2022-04-06 NOTE — Telephone Encounter (Signed)
TC to Pt responding to my chart message. Pt stated he has been having nausea and would like something stronger. Dr Benay Spice prescribed ativan 0.5 mg to take every 8 hours as needed. And to not take with Xanax Pt stated he can't stop taking Xanax because he will have seizures. Discussed with Dr Benay Spice who stated pt should continue taking Maalox or Mylanta. Pt stated he stopped taking Zofran and phenergan. Informed Pt to start back taking Zofran and phenergan to see if it would work. Pt verbalized understanding.

## 2022-04-07 ENCOUNTER — Encounter: Payer: Self-pay | Admitting: Oncology

## 2022-04-10 ENCOUNTER — Encounter: Payer: Self-pay | Admitting: Oncology

## 2022-04-11 ENCOUNTER — Encounter: Payer: Self-pay | Admitting: *Deleted

## 2022-04-11 NOTE — Progress Notes (Signed)
Call to [facility] [phone] to f/u on referral sent 04/06/22. Confirmed with Mattie that it has been received and is in review. States it can take up to 14 business days to be scheduled.

## 2022-04-13 ENCOUNTER — Encounter: Payer: Self-pay | Admitting: Oncology

## 2022-04-14 ENCOUNTER — Encounter: Payer: Self-pay | Admitting: Oncology

## 2022-04-18 ENCOUNTER — Encounter: Payer: Self-pay | Admitting: Oncology

## 2022-04-19 ENCOUNTER — Other Ambulatory Visit: Payer: Self-pay | Admitting: Adult Health

## 2022-04-19 ENCOUNTER — Encounter: Payer: Self-pay | Admitting: Adult Health

## 2022-04-19 DIAGNOSIS — F411 Generalized anxiety disorder: Secondary | ICD-10-CM

## 2022-04-19 DIAGNOSIS — F410 Panic disorder [episodic paroxysmal anxiety] without agoraphobia: Secondary | ICD-10-CM

## 2022-04-21 ENCOUNTER — Other Ambulatory Visit: Payer: Self-pay | Admitting: Interventional Radiology

## 2022-04-21 DIAGNOSIS — C189 Malignant neoplasm of colon, unspecified: Secondary | ICD-10-CM

## 2022-04-24 ENCOUNTER — Encounter: Payer: Self-pay | Admitting: Oncology

## 2022-04-25 ENCOUNTER — Encounter: Payer: Self-pay | Admitting: Oncology

## 2022-04-25 ENCOUNTER — Other Ambulatory Visit: Payer: Self-pay | Admitting: *Deleted

## 2022-04-25 MED ORDER — PROMETHAZINE HCL 25 MG PO TABS: 25.0000 mg | ORAL_TABLET | Freq: Four times a day (QID) | ORAL | 1 refills | Status: DC | PRN

## 2022-04-26 ENCOUNTER — Other Ambulatory Visit: Payer: Self-pay | Admitting: *Deleted

## 2022-04-26 DIAGNOSIS — D509 Iron deficiency anemia, unspecified: Secondary | ICD-10-CM

## 2022-04-26 DIAGNOSIS — C182 Malignant neoplasm of ascending colon: Secondary | ICD-10-CM

## 2022-04-26 NOTE — Progress Notes (Signed)
Placed referral order for dietician to see re: nutritional issues.

## 2022-04-27 ENCOUNTER — Inpatient Hospital Stay: Payer: Commercial Managed Care - HMO | Admitting: Oncology

## 2022-04-27 ENCOUNTER — Inpatient Hospital Stay: Payer: Commercial Managed Care - HMO

## 2022-04-27 VITALS — BP 134/74 | HR 98 | Temp 98.2°F | Resp 18 | Ht 66.0 in | Wt 255.8 lb

## 2022-04-27 DIAGNOSIS — R970 Elevated carcinoembryonic antigen [CEA]: Secondary | ICD-10-CM | POA: Diagnosis not present

## 2022-04-27 DIAGNOSIS — Z9221 Personal history of antineoplastic chemotherapy: Secondary | ICD-10-CM | POA: Diagnosis not present

## 2022-04-27 DIAGNOSIS — C787 Secondary malignant neoplasm of liver and intrahepatic bile duct: Secondary | ICD-10-CM | POA: Diagnosis present

## 2022-04-27 DIAGNOSIS — C182 Malignant neoplasm of ascending colon: Secondary | ICD-10-CM | POA: Diagnosis present

## 2022-04-27 DIAGNOSIS — K760 Fatty (change of) liver, not elsewhere classified: Secondary | ICD-10-CM | POA: Diagnosis not present

## 2022-04-27 DIAGNOSIS — D509 Iron deficiency anemia, unspecified: Secondary | ICD-10-CM | POA: Diagnosis not present

## 2022-04-27 DIAGNOSIS — Z79899 Other long term (current) drug therapy: Secondary | ICD-10-CM | POA: Insufficient documentation

## 2022-04-27 LAB — CBC WITH DIFFERENTIAL (CANCER CENTER ONLY)
Abs Immature Granulocytes: 0.03 10*3/uL (ref 0.00–0.07)
Basophils Absolute: 0 10*3/uL (ref 0.0–0.1)
Basophils Relative: 1 %
Eosinophils Absolute: 0.4 10*3/uL (ref 0.0–0.5)
Eosinophils Relative: 6 %
HCT: 35.9 % — ABNORMAL LOW (ref 39.0–52.0)
Hemoglobin: 10.8 g/dL — ABNORMAL LOW (ref 13.0–17.0)
Immature Granulocytes: 1 %
Lymphocytes Relative: 15 %
Lymphs Abs: 1 10*3/uL (ref 0.7–4.0)
MCH: 22.5 pg — ABNORMAL LOW (ref 26.0–34.0)
MCHC: 30.1 g/dL (ref 30.0–36.0)
MCV: 74.8 fL — ABNORMAL LOW (ref 80.0–100.0)
Monocytes Absolute: 0.6 10*3/uL (ref 0.1–1.0)
Monocytes Relative: 9 %
Neutro Abs: 4.4 10*3/uL (ref 1.7–7.7)
Neutrophils Relative %: 68 %
Platelet Count: 395 10*3/uL (ref 150–400)
RBC: 4.8 MIL/uL (ref 4.22–5.81)
RDW: 16.2 % — ABNORMAL HIGH (ref 11.5–15.5)
WBC Count: 6.5 10*3/uL (ref 4.0–10.5)
nRBC: 0 % (ref 0.0–0.2)

## 2022-04-27 LAB — CEA (ACCESS): CEA (CHCC): 19.56 ng/mL — ABNORMAL HIGH (ref 0.00–5.00)

## 2022-04-27 MED ORDER — METOCLOPRAMIDE HCL 10 MG PO TABS: 10.0000 mg | ORAL_TABLET | Freq: Three times a day (TID) | ORAL | 0 refills | Status: DC

## 2022-04-27 NOTE — Progress Notes (Signed)
[facility] OFFICE PROGRESS NOTE   Diagnosis: Colon cancer, anemia  INTERVAL HISTORY:   Mr. Tetrault returns as scheduled.  He reports constant nausea.  No emesis.  Zofran and Phenergan have not helped.  He is having bowel movements.  He continues to have discomfort at the tongue.  He has altered taste.  Chocolate milk is tolerated.  He is eating very little.  Objective:  Vital signs in last 24 hours:  Blood pressure 134/74, pulse 98, temperature 98.2 F (36.8 C), temperature source Oral, resp. rate 18, height 5' 6"  (1.676 m), weight 255 lb 12.8 oz (116 kg), SpO2 100 %.    HEENT: Mild erythema over the tongue, no discrete ulcer, no thrush Resp: Lungs clear bilaterally Cardio: Regular rate and rhythm GI: No hepatosplenomegaly, no mass, no apparent ascites Vascular: No leg edema   Lab Results:  Lab Results  Component Value Date   WBC 6.5 04/27/2022   HGB 10.8 (L) 04/27/2022   HCT 35.9 (L) 04/27/2022   MCV 74.8 (L) 04/27/2022   PLT 395 04/27/2022   NEUTROABS 4.4 04/27/2022    CMP  Lab Results  Component Value Date   NA 141 02/23/2022   K 4.8 02/23/2022   CL 102 02/23/2022   CO2 28 02/23/2022   GLUCOSE 136 (H) 02/23/2022   BUN 10 02/23/2022   CREATININE 0.89 02/23/2022   CALCIUM 9.1 02/23/2022   PROT 7.3 02/08/2022   ALBUMIN 3.6 02/08/2022   AST 25 02/08/2022   ALT 20 02/08/2022   ALKPHOS 93 02/08/2022   BILITOT 0.6 02/08/2022   GFRNONAA >60 02/23/2022   GFRAA >60 05/10/2020    Lab Results  Component Value Date   CEA1 1.08 03/07/2021   CEA 19.56 (H) 04/27/2022    Medications: I have reviewed the patient's current medications.   Assessment/Plan: Moderately differentiated adenocarcinoma of the ascending colon, stage IIb (T4aN0), status post a right colectomy 04/12/2020 Tumor invades the visceral peritoneum, 0/19 lymph nodes, no lymphovascular or perineural invasion, no tumor deposits, MSS, no loss of mismatch repair protein  expression Cycle 1 adjuvant Xeloda 05/17/2020 Cycle 2 adjuvant Xeloda 06/07/2020, Xeloda discontinued after 12 days secondary to nausea and rectal bleeding Cycle 3 adjuvant Xeloda 06/28/2020, dose reduced to 2000 mg a.m., 1500 mg p.m. secondary to nausea (patient discontinued Xeloda on day 11 due to colonoscopy) Colonoscopy 07/09/2020-nodular mucosa at the colonic anastomosis, biopsied; patent end-to-side ileocolonic anastomosis characterized by healthy-appearing mucosa and visible sutures; nonbleeding internal hemorrhoids, biopsy with benign ulcerated anastomotic mucosa with granulation tissue Cycle 4 adjuvant Xeloda 07/19/2020, 2000 mg every morning and 1500 mg every afternoon Cycle 5 adjuvant Xeloda 08/09/2020 Cycle 6 adjuvant Xeloda 08/31/2019 Cycle 7 adjuvant Xeloda 09/20/2020 Cycle 8 adjuvant Xeloda 10/11/2020 CTs 03/07/2021-no evidence of recurrent disease, hepatic steatosis, slight wall thickening at the junction of the descending and horizontal duodenum Mild elevation of CEA 2023 12/01/2021-Guardant Reveal-ct DNA detected PET 12/30/2021-hypermetabolic right liver lesion, hypermetabolic area in a loop of mid small bowel with an SUV of 12.5, review of PET images at GI tumor conference 01/11/2022-small bowel uptake felt to be a benign finding MRI liver 01/10/2022-solitary 1.2 cm right liver lesion between segments 7 and 8 compatible with metastasis, subtle changes suggesting cirrhosis, hepatic steatosis CT enteroscopy 02/02/2022-small bowel loop with hypermetabolic activity on PET continues to have a bandlike density along the margin felt to represent a diverticulum or adjacent nodal tissue.  Small bowel tumor not excluded.  3-4 mm left omental nodule 02/08/2022-biopsy and ablation of solitary  liver lesion, adenocarcinoma consistent with metastatic colorectal adenocarcinoma CTs 03/31/2022-unchanged circumstantial soft tissue thickening surrounding a loop of mid to distal small bowel with an adjacent nodular  focus measuring 1.2 cm.,  Ablation cavity in segment 7, no other evidence of metastatic disease Microcytic anemia secondary to #1-improved Colon polyps-sessile serrated adenoma of the descending colon, tubular adenomas with focal high-grade dysplasia of the transverse colon on colonoscopy 02/12/2020, tubular adenoma of the mid ascending colon on the surgical specimen 04/12/2020 Family history of colon cancer Depression Anxiety/panic attacks Hypertension Hyperlipidemia Nausea secondary to Xeloda?-Resolved Gastroesophageal reflux disease-improved following discontinuation of Xeloda Anemia, microcytic; ferritin 6 01/24/2022; stool positive for blood x3 01/25/2022; 02/02/2022 CT abdomen/pelvis enterography-bowel loop demonstrating hypermetabolic activity continues to have a bandlike density along its margin possibly representing a diverticulum or adjacent nodal tissue, difficult to exclude small bowel tumor, 3 x 4 mm left omental nodule or lymph node, known right hepatic lobe tumor is occult on CT. Venofer weekly x3 beginning 03/03/2022 Negative capsule endoscopy 03/09/2022 Venofer 03/03/2022 for 3 weekly doses 12.  Anterior left neck nodule 03/17/2022-cyst?,  Lymph node?   Disposition: Mr. Cress has a history of metastatic colon cancer.  He underwent ablation of an isolated liver lesion on 02/08/2022.  The CEA remains elevated.  He has an area of thickening in the mid to distal small bowel.  This may reflect a small bowel tumor, carcinomatosis, or an inflammatory condition.  His case been presented at the GI tumor conference.  The area cannot be reached with a standard endoscope.  I discussed the case with Dr. Marcello Moores.  We are referring him to [facility] for a balloon endoscopy, but he does not have an appointment yet.  I discussed the case with Dr. Marcello Moores again today.  She will consider a diagnostic laparoscopy if he cannot be scheduled at [facility].  He complains of constant nausea.  He agrees to a trial of Reglan.   We reviewed potential toxicities associated with Reglan including the chance of tardive dyskinesia.  He will discontinue Phenergan.  We discussed a trial of Decadron, but he reports anxiety when he was treated with steroids in the past.  He will return for a lab visit 05/03/2022.  We will administer another course of IV iron if the ferritin returns low.  Oral iron caused nausea.  He will return for an office visit 05/18/2022.  He will contact us in the interim as needed.  Betsy Coder, MD  04/27/2022  3:21 PM

## 2022-04-27 NOTE — Patient Instructions (Signed)
For Nausea:   Discontinue Phenergan   Begin Reglan 10 mg by mouth three times daily before meals

## 2022-04-28 ENCOUNTER — Encounter: Payer: Self-pay | Admitting: Oncology

## 2022-04-28 ENCOUNTER — Telehealth: Payer: Self-pay | Admitting: Oncology

## 2022-04-28 ENCOUNTER — Encounter: Payer: Self-pay | Admitting: *Deleted

## 2022-04-28 ENCOUNTER — Other Ambulatory Visit: Payer: Self-pay | Admitting: *Deleted

## 2022-04-28 ENCOUNTER — Encounter: Payer: Self-pay | Admitting: Nurse Practitioner

## 2022-04-28 DIAGNOSIS — C182 Malignant neoplasm of ascending colon: Secondary | ICD-10-CM

## 2022-04-28 NOTE — Progress Notes (Signed)
Patient agrees to psychology referral with Elias Else. Placed order for "urgent" referral.

## 2022-04-28 NOTE — Telephone Encounter (Signed)
Attempted to contact patient in regards to los 8/31, appointments were scheduled and posted on Mychart and we also left a voicemail with details

## 2022-04-28 NOTE — Progress Notes (Signed)
Called [facility] and was transferred to [phone] and spoke w/Tamara. Case is in supervisor review now. Reports that referral in July states patient declined appointment--explained that this was due to not having an insurance [facility] would accept. He now has [place] and this information was sent with the 04/06/22 referral. Jonelle Sidle has sent an urgent message to Dr. Cristi Loron and included that patient is having increase constant nausea, abdominal bloating and very anxious. Also provided office back door phone # if he needs to discuss case with Dr. Benay Spice. She would not provide the providers email address. Will follow up next week. Patient notified of case still in review. Has been 15 business days since referral sent on 04/06/22.

## 2022-05-02 ENCOUNTER — Encounter: Payer: Self-pay | Admitting: Oncology

## 2022-05-03 ENCOUNTER — Encounter: Payer: Self-pay | Admitting: Oncology

## 2022-05-03 ENCOUNTER — Inpatient Hospital Stay: Payer: Commercial Managed Care - HMO | Attending: Oncology

## 2022-05-03 ENCOUNTER — Encounter: Payer: Self-pay | Admitting: Nurse Practitioner

## 2022-05-03 ENCOUNTER — Ambulatory Visit (INDEPENDENT_AMBULATORY_CARE_PROVIDER_SITE_OTHER): Payer: Commercial Managed Care - HMO | Admitting: Psychologist

## 2022-05-03 DIAGNOSIS — F411 Generalized anxiety disorder: Secondary | ICD-10-CM

## 2022-05-03 DIAGNOSIS — I10 Essential (primary) hypertension: Secondary | ICD-10-CM | POA: Insufficient documentation

## 2022-05-03 DIAGNOSIS — F410 Panic disorder [episodic paroxysmal anxiety] without agoraphobia: Secondary | ICD-10-CM | POA: Insufficient documentation

## 2022-05-03 DIAGNOSIS — K760 Fatty (change of) liver, not elsewhere classified: Secondary | ICD-10-CM | POA: Insufficient documentation

## 2022-05-03 DIAGNOSIS — C182 Malignant neoplasm of ascending colon: Secondary | ICD-10-CM | POA: Insufficient documentation

## 2022-05-03 DIAGNOSIS — D509 Iron deficiency anemia, unspecified: Secondary | ICD-10-CM | POA: Diagnosis not present

## 2022-05-03 DIAGNOSIS — Z79899 Other long term (current) drug therapy: Secondary | ICD-10-CM | POA: Insufficient documentation

## 2022-05-03 DIAGNOSIS — K219 Gastro-esophageal reflux disease without esophagitis: Secondary | ICD-10-CM | POA: Diagnosis not present

## 2022-05-03 DIAGNOSIS — F330 Major depressive disorder, recurrent, mild: Secondary | ICD-10-CM | POA: Diagnosis not present

## 2022-05-03 DIAGNOSIS — K648 Other hemorrhoids: Secondary | ICD-10-CM | POA: Insufficient documentation

## 2022-05-03 DIAGNOSIS — F32A Depression, unspecified: Secondary | ICD-10-CM | POA: Diagnosis not present

## 2022-05-03 DIAGNOSIS — C787 Secondary malignant neoplasm of liver and intrahepatic bile duct: Secondary | ICD-10-CM | POA: Diagnosis present

## 2022-05-03 DIAGNOSIS — E785 Hyperlipidemia, unspecified: Secondary | ICD-10-CM | POA: Insufficient documentation

## 2022-05-03 LAB — CBC WITH DIFFERENTIAL (CANCER CENTER ONLY)
Abs Immature Granulocytes: 0.02 10*3/uL (ref 0.00–0.07)
Basophils Absolute: 0 10*3/uL (ref 0.0–0.1)
Basophils Relative: 1 %
Eosinophils Absolute: 0.4 10*3/uL (ref 0.0–0.5)
Eosinophils Relative: 5 %
HCT: 37 % — ABNORMAL LOW (ref 39.0–52.0)
Hemoglobin: 11 g/dL — ABNORMAL LOW (ref 13.0–17.0)
Immature Granulocytes: 0 %
Lymphocytes Relative: 15 %
Lymphs Abs: 1.1 10*3/uL (ref 0.7–4.0)
MCH: 22.3 pg — ABNORMAL LOW (ref 26.0–34.0)
MCHC: 29.7 g/dL — ABNORMAL LOW (ref 30.0–36.0)
MCV: 74.9 fL — ABNORMAL LOW (ref 80.0–100.0)
Monocytes Absolute: 0.6 10*3/uL (ref 0.1–1.0)
Monocytes Relative: 8 %
Neutro Abs: 4.9 10*3/uL (ref 1.7–7.7)
Neutrophils Relative %: 71 %
Platelet Count: 372 10*3/uL (ref 150–400)
RBC: 4.94 MIL/uL (ref 4.22–5.81)
RDW: 15.9 % — ABNORMAL HIGH (ref 11.5–15.5)
WBC Count: 6.9 10*3/uL (ref 4.0–10.5)
nRBC: 0 % (ref 0.0–0.2)

## 2022-05-03 LAB — FERRITIN: Ferritin: 14 ng/mL — ABNORMAL LOW (ref 24–336)

## 2022-05-03 NOTE — Plan of Care (Signed)

## 2022-05-03 NOTE — Progress Notes (Signed)
                Anthony Mihalik, PsyD 

## 2022-05-03 NOTE — Progress Notes (Signed)
[place] Counselor Initial Adult Exam  Name: Anthony Calhoun Date: 05/03/2022 MRN: [id number] DOB: 02/23/1966 PCP: Anthony Contras, MD  Time spent: 1:20 pm to 1:50 pm; total time: 30 minutes  This session was held via video webex teletherapy due to the coronavirus risk at this time. The patient consented to video teletherapy and was located at his home during this session. He is aware it is the responsibility of the patient to secure confidentiality on his end of the session. The provider was in a private home office for the duration of this session. Limits of confidentiality were discussed with the patient.   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  Mental Status Exam: Appearance:   Well Groomed     Behavior:  Appropriate  Motor:  Normal  Speech/Language:   Clear and Coherent  Affect:  Appropriate  Mood:  normal  Thought process:  normal  Thought content:    WNL  Sensory/Perceptual disturbances:    WNL  Orientation:  oriented to person, place, and time/date  Attention:  Good  Concentration:  Good  Memory:  WNL  Fund of knowledge:   Good  Insight:    Good  Judgment:   Good  Impulse Control:  Good    Reported Symptoms:  The patient endorsed experiencing the following: racing thoughts, feeling restless, feeling on edge, heart palpitations, feeling overwhelmed, and difficulty controlling worries.He endorsed passive suicidal ideation. He denied having a plan or intent to act on a plan. He denied homicidal ideation.     The patient endorsed experiencing the following: rumination of negative thoughts, social isolation, fatigue, keeping to self, avoiding pleasurable activities, and thoughts of hopelessness. He endorsed passive suicidal ideation. He denied having a plan or intent to act on a plan. He denied homicidal ideation.    Risk Assessment: Danger to Self:  He endorsed passive suicidal  ideation. He denied having a plan or intent to act on a plan. He denied homicidal ideation.   Self-injurious Behavior: No Danger to Others: No Duty to Warn:no Physical Aggression / Violence:No  Access to Firearms a concern: No  Gang Involvement:No  Patient / guardian was educated about steps to take if suicide or homicide risk level increases between visits: n/a While future psychiatric events cannot be accurately predicted, the patient does not currently require acute inpatient psychiatric care and does not currently meet [facility] involuntary commitment criteria.  Substance Abuse History: Current substance abuse: No     Past Psychiatric History:   Previous psychological history is significant for anxiety Outpatient Providers:NA History of Psych Hospitalization: No  Psychological Testing:  NA    Abuse History:  Victim of: No.,  NA    Report needed: No. Victim of Neglect:No. Perpetrator of  NA   Witness / Exposure to Domestic Violence: No   Protective Services Involvement: No  Witness to [employer] Violence:  No   Family History: No family history on file.  Living situation: the patient lives with their family  Sexual Orientation: Straight  Relationship Status: Engaged  Name of spouse / other:Anthony Calhoun If a parent, number of children / ages:Patient has a son whose name is Anthony Calhoun and a daughter named Anthony Calhoun.   Support Systems: Family  Financial Stress:  Yes   Income/Employment/Disability: No income  Armed forces logistics/support/administrative officer: No   Educational History: Education: some college  Religion/Sprituality/World View: Baptist/Christian  Any cultural differences that may affect / interfere  with treatment:  not applicable   Recreation/Hobbies: Playing music and writing music  Stressors: Health problems    Strengths: Supportive Relationships  Barriers:  NA   Legal History: Pending legal issue / charges: The patient has no significant history of legal issues. History of legal  issue / charges:  NA  Medical History/Surgical History: reviewed Past Medical History:  Diagnosis Date   Anemia    Anxiety    Arthritis    back,    Cancer ([place])    cecum   Depression    Diabetes mellitus without complication ([place])    Dyspnea    started with anemia   GERD (gastroesophageal reflux disease)    History of kidney stones    Hypertension    Neuromuscular disorder (HCC)    carpal tunnel    Past Surgical History:  Procedure Laterality Date   APPENDECTOMY     COLON SURGERY     COLONOSCOPY     CYSTOSCOPY W/ URETEROSCOPY W/ LITHOTRIPSY  1981   IR RADIOLOGIST EVAL & MGMT  01/17/2022   IR RADIOLOGIST EVAL & MGMT  03/07/2022   RADIOLOGY WITH ANESTHESIA N/A 02/08/2022   Procedure: CT MICROWAVE ABLATION;  Surgeon: Anthony Peaches, MD;  Location: [facility];  Service: Radiology;  Laterality: N/A;    Medications: Current Outpatient Medications  Medication Sig Dispense Refill   ALPRAZolam (XANAX) 1 MG tablet Take one tablet three times daily as needed for anxiety. 90 tablet 2   amLODipine (NORVASC) 5 MG tablet Take 5 mg by mouth daily.     atorvastatin (LIPITOR) 40 MG tablet Take 40 mg by mouth daily.     carvedilol (COREG) 6.25 MG tablet Take 6.25 mg by mouth 2 (two) times daily with a meal.     docusate sodium (COLACE) 100 MG capsule Take 1 capsule (100 mg total) by mouth 2 (two) times daily. (Patient not taking: Reported on 03/24/2022) 10 capsule 0   lidocaine (XYLOCAINE) 2 % solution Use as directed in the mouth or throat 3 (three) times daily as needed.     magic mouthwash SOLN Take 5 mLs by mouth 4 (four) times daily as needed for mouth pain (swish for 1 minute and then spit for mouth pain). (Patient not taking: Reported on 04/27/2022) 240 mL 1   metoCLOPramide (REGLAN) 10 MG tablet Take 1 tablet (10 mg total) by mouth 3 (three) times daily before meals. 90 tablet 0   omeprazole (PRILOSEC) 20 MG capsule Take 20 mg by mouth 2 (two) times daily before a meal.      ondansetron (ZOFRAN) 8 MG tablet Take 1 tablet (8 mg total) by mouth every 8 (eight) hours as needed for nausea. (Patient not taking: Reported on 04/27/2022) 30 tablet 1   oxyCODONE (ROXICODONE) 5 MG immediate release tablet Take 1 tablet (5 mg total) by mouth every 6 (six) hours as needed for moderate pain. (Patient not taking: Reported on 02/23/2022) 15 tablet 0   sertraline (ZOLOFT) 100 MG tablet TAKE 2 TABLETS BY MOUTH EVERY DAY 180 tablet 1   triamcinolone (KENALOG) 0.1 % paste Use as directed 1 application. in the mouth or throat 2 (two) times daily. (Patient not taking: Reported on 04/27/2022)     No current facility-administered medications for this visit.    Allergies  Allergen Reactions   Chlorthalidone Swelling    Swelling of lips   Cyclobenzaprine Other (See Comments)    Unknown reaction   Gabapentin Nausea Only   Losartan Potassium-Hctz Other (See  Comments)   Prednisone Anxiety    "panic attack" Panic attacks    Diagnoses:  F41.1 generalized anxiety disorder and F33.0 major depressive affective disorder, recurrent, mild  Plan of Care: The patient is a 56 year old Caucasian male who was referred due to experiencing anxiety secondary to cancer. The patient lives with his mother. The patient meets criteria for a diagnosis of F41.1 generalized anxiety disorder based off of the following: racing thoughts, feeling restless, feeling on edge, heart palpitations, feeling overwhelmed, and difficulty controlling worries. He endorsed passive suicidal ideation. He denied having a plan or intent to act on a plan. He denied homicidal ideation.  The patient meets criteria for a diagnosis of F33.0 major depressive affective disorder, recurrent, mild based off of the following: rumination of negative thoughts, social isolation, fatigue, keeping to self, avoiding pleasurable activities, and thoughts of hopelessness. He endorsed passive suicidal ideation. He denied having a plan or intent to act on a  plan. He denied homicidal ideation.    The patient stated that he wants coping skills, to not feel alone, and to process the emotions that he is experiencing.  This psychologist makes the recommendation that the patient participate in bi-weekly therapy if possible. Depending on severity, possibly weekly.    Conception Chancy, PsyD

## 2022-05-04 ENCOUNTER — Other Ambulatory Visit: Payer: Self-pay | Admitting: Nurse Practitioner

## 2022-05-04 ENCOUNTER — Encounter: Payer: Self-pay | Admitting: Oncology

## 2022-05-05 ENCOUNTER — Inpatient Hospital Stay: Payer: Commercial Managed Care - HMO

## 2022-05-05 ENCOUNTER — Encounter: Payer: Self-pay | Admitting: Oncology

## 2022-05-05 ENCOUNTER — Encounter: Payer: Self-pay | Admitting: Nurse Practitioner

## 2022-05-05 VITALS — BP 117/71 | HR 88 | Temp 97.8°F

## 2022-05-05 DIAGNOSIS — E860 Dehydration: Secondary | ICD-10-CM

## 2022-05-05 DIAGNOSIS — C182 Malignant neoplasm of ascending colon: Secondary | ICD-10-CM | POA: Diagnosis not present

## 2022-05-05 DIAGNOSIS — D509 Iron deficiency anemia, unspecified: Secondary | ICD-10-CM

## 2022-05-05 MED ORDER — SODIUM CHLORIDE 0.9 % IV SOLN
300.0000 mg | Freq: Once | INTRAVENOUS | Status: AC
  Administered 2022-05-05: 300 mg via INTRAVENOUS
  Filled 2022-05-05: qty 15

## 2022-05-05 MED ORDER — SODIUM CHLORIDE 0.9 % IV SOLN: INTRAVENOUS | Status: AC

## 2022-05-05 MED ORDER — SODIUM CHLORIDE 0.9 % IV SOLN: Freq: Once | INTRAVENOUS | Status: AC

## 2022-05-05 NOTE — Patient Instructions (Addendum)
Iron Sucrose Injection What is this medication? IRON SUCROSE (EYE ern SOO krose) treats low levels of iron (iron deficiency anemia) in people with kidney disease. Iron is a mineral that plays an important role in making red blood cells, which carry oxygen from your lungs to the rest of your body. This medicine may be used for other purposes; ask your health care provider or pharmacist if you have questions. COMMON BRAND NAME(S): Venofer What should I tell my care team before I take this medication? They need to know if you have any of these conditions: Anemia not caused by low iron levels Heart disease High levels of iron in the blood Kidney disease Liver disease An unusual or allergic reaction to iron, other medications, foods, dyes, or preservatives Pregnant or trying to get pregnant Breast-feeding How should I use this medication? This medication is for infusion into a vein. It is given in a hospital or clinic setting. Talk to your care team about the use of this medication in children. While this medication may be prescribed for children as young as 2 years for selected conditions, precautions do apply. Overdosage: If you think you have taken too much of this medicine contact a poison control center or emergency room at once. NOTE: This medicine is only for you. Do not share this medicine with others. What if I miss a dose? It is important not to miss your dose. Call your care team if you are unable to keep an appointment. What may interact with this medication? Do not take this medication with any of the following: Deferoxamine Dimercaprol Other iron products This medication may also interact with the following: Chloramphenicol Deferasirox This list may not describe all possible interactions. Give your health care provider a list of all the medicines, herbs, non-prescription drugs, or dietary supplements you use. Also tell them if you smoke, drink alcohol, or use illegal drugs.  Some items may interact with your medicine. What should I watch for while using this medication? Visit your care team regularly. Tell your care team if your symptoms do not start to get better or if they get worse. You may need blood work done while you are taking this medication. You may need to follow a special diet. Talk to your care team. Foods that contain iron include: whole grains/cereals, dried fruits, beans, or peas, leafy green vegetables, and organ meats (liver, kidney). What side effects may I notice from receiving this medication? Side effects that you should report to your care team as soon as possible: Allergic reactions--skin rash, itching, hives, swelling of the face, lips, tongue, or throat Low blood pressure--dizziness, feeling faint or lightheaded, blurry vision Shortness of breath Side effects that usually do not require medical attention (report to your care team if they continue or are bothersome): Flushing Headache Joint pain Muscle pain Nausea Pain, redness, or irritation at injection site This list may not describe all possible side effects. Call your doctor for medical advice about side effects. You may report side effects to FDA at 1-800-FDA-1088. Where should I keep my medication? This medication is given in a hospital or clinic and will not be stored at home. NOTE: This sheet is a summary. It may not cover all possible information. If you have questions about this medicine, talk to your doctor, pharmacist, or health care provider.  2023 Elsevier/Gold Standard (2007-10-05 00:00:00)  Rehydration, Adult Rehydration is the replacement of body fluids, salts, and minerals (electrolytes) that are lost during dehydration. Dehydration is when there  is not enough water or other fluids in the body. This happens when you lose more fluids than you take in. Common causes of dehydration include: Not drinking enough fluids. This can occur when you are ill or doing activities  that require a lot of energy, especially in hot weather. Conditions that cause loss of water or other fluids, such as diarrhea, vomiting, sweating, or urinating a lot. Other illnesses, such as fever or infection. Certain medicines, such as those that remove excess fluid from the body (diuretics). Symptoms of mild or moderate dehydration may include thirst, dry lips and mouth, and dizziness. Symptoms of severe dehydration may include increased heart rate, confusion, fainting, and not urinating. For severe dehydration, you may need to get fluids through an IV at the hospital. For mild or moderate dehydration, you can usually rehydrate at home by drinking certain fluids as told by your health care provider. What are the risks? Generally, rehydration is safe. However, taking in too much fluid (overhydration) can be a problem. This is rare. Overhydration can cause an electrolyte imbalance, kidney failure, or a decrease in salt (sodium) levels in the body. Supplies needed You will need an oral rehydration solution (ORS) if your health care provider tells you to use one. This is a drink to treat dehydration. It can be found in pharmacies and retail stores. How to rehydrate Fluids Follow instructions from your health care provider for rehydration. The kind of fluid and the amount you should drink depend on your condition. In general, you should choose drinks that you prefer. If told by your health care provider, drink an ORS. Make an ORS by following instructions on the package. Start by drinking small amounts, about  cup (120 mL) every 5-10 minutes. Slowly increase how much you drink until you have taken the amount recommended by your health care provider. Drink enough clear fluids to keep your urine pale yellow. If you were told to drink an ORS, finish it first, then start slowly drinking other clear fluids. Drink fluids such as: Water. This includes sparkling water and flavored water. Drinking only  water can lead to having too little sodium in your body (hyponatremia). Follow the advice of your health care provider. Water from ice chips you suck on. Fruit juice with water you add to it (diluted). Sports drinks. Hot or cold herbal teas. Broth-based soups. Milk or milk products. Food Follow instructions from your health care provider about what to eat while you rehydrate. Your health care provider may recommend that you slowly begin eating regular foods in small amounts. Eat foods that contain a healthy balance of electrolytes, such as bananas, oranges, potatoes, tomatoes, and spinach. Avoid foods that are greasy or contain a lot of sugar. In some cases, you may get nutrition through a feeding tube that is passed through your nose and into your stomach (nasogastric tube, or NG tube). This may be done if you have uncontrolled vomiting or diarrhea. Beverages to avoid  Certain beverages may make dehydration worse. While you rehydrate, avoid drinking alcohol. How to tell if you are recovering from dehydration You may be recovering from dehydration if: You are urinating more often than before you started rehydrating. Your urine is pale yellow. Your energy level improves. You vomit less frequently. You have diarrhea less frequently. Your appetite improves or returns to normal. You feel less dizzy or less light-headed. Your skin tone and color start to look more normal. Follow these instructions at home: Take over-the-counter and prescription medicines only  as told by your health care provider. Do not take sodium tablets. Doing this can lead to having too much sodium in your body (hypernatremia). Contact a health care provider if: You continue to have symptoms of mild or moderate dehydration, such as: Thirst. Dry lips. Slightly dry mouth. Dizziness. Dark urine or less urine than normal. Muscle cramps. You continue to vomit or have diarrhea. Get help right away if you: Have symptoms  of dehydration that get worse. Have a fever. Have a severe headache. Have been vomiting and the following happens: Your vomiting gets worse or does not go away. Your vomit includes blood or green matter (bile). You cannot eat or drink without vomiting. Have problems with urination or bowel movements, such as: Diarrhea that gets worse or does not go away. Blood in your stool (feces). This may cause stool to look black and tarry. Not urinating, or urinating only a small amount of very dark urine, within 6-8 hours. Have trouble breathing. Have symptoms that get worse with treatment. These symptoms may represent a serious problem that is an emergency. Do not wait to see if the symptoms will go away. Get medical help right away. Call your local emergency services (911 in the [place] Do not drive yourself to the hospital. Summary Rehydration is the replacement of body fluids and minerals (electrolytes) that are lost during dehydration. Follow instructions from your health care provider for rehydration. The kind of fluid and amount you should drink depend on your condition. Slowly increase how much you drink until you have taken the amount recommended by your health care provider. Contact your health care provider if you continue to show signs of mild or moderate dehydration. This information is not intended to replace advice given to you by your health care provider. Make sure you discuss any questions you have with your health care provider. Document Revised: 10/15/2019 Document Reviewed: 08/25/2019 Elsevier Patient Education  [place].

## 2022-05-05 NOTE — Progress Notes (Signed)
Cristy Friedlander, RN for Ned Card, NP notified...PT is requesting fluids.    Pt states that he feels dehydrated.  His mouth is dry, he is not taking in much po.  Complains that is urine is darker than usual and output is less that normal.

## 2022-05-08 ENCOUNTER — Ambulatory Visit (HOSPITAL_COMMUNITY): Payer: Commercial Managed Care - HMO

## 2022-05-08 ENCOUNTER — Encounter: Payer: Self-pay | Admitting: Oncology

## 2022-05-08 ENCOUNTER — Telehealth: Payer: Self-pay | Admitting: Oncology

## 2022-05-08 ENCOUNTER — Encounter (HOSPITAL_COMMUNITY): Payer: Self-pay

## 2022-05-08 ENCOUNTER — Ambulatory Visit: Payer: Self-pay | Admitting: General Surgery

## 2022-05-08 DIAGNOSIS — R112 Nausea with vomiting, unspecified: Secondary | ICD-10-CM

## 2022-05-08 NOTE — Telephone Encounter (Signed)
On 9/8 patient called to cancel appointment on 05/10/22.

## 2022-05-09 ENCOUNTER — Encounter: Payer: Self-pay | Admitting: Oncology

## 2022-05-09 ENCOUNTER — Encounter: Payer: Self-pay | Admitting: Nurse Practitioner

## 2022-05-09 ENCOUNTER — Telehealth: Payer: Self-pay | Admitting: *Deleted

## 2022-05-09 ENCOUNTER — Other Ambulatory Visit: Payer: Self-pay | Admitting: Nurse Practitioner

## 2022-05-09 ENCOUNTER — Ambulatory Visit: Payer: Commercial Managed Care - HMO | Admitting: Psychologist

## 2022-05-09 DIAGNOSIS — D509 Iron deficiency anemia, unspecified: Secondary | ICD-10-CM

## 2022-05-09 DIAGNOSIS — C182 Malignant neoplasm of ascending colon: Secondary | ICD-10-CM

## 2022-05-09 MED ORDER — HYDROCOD POLI-CHLORPHE POLI ER 10-8 MG/5ML PO SUER: 5.0000 mL | Freq: Two times a day (BID) | ORAL | 0 refills | Status: DC | PRN

## 2022-05-09 NOTE — Telephone Encounter (Addendum)
Left VM for patient to call back with more detail on his cough and what has he been taking for it.  Anthony Calhoun called back to report the following: Does sleep on 2 pillows at night Cough is getting worse despite Robitussin DM every 6 hours No fever Short of breath only with coughing spell Cough is nonproductive Coughing so much his ribs and head hurts No pattern-constant day and night  Per Anthony Card, NP: Will call in Tussionex and get CXR tomorrow. Patient notified via [place].

## 2022-05-10 ENCOUNTER — Encounter: Payer: Self-pay | Admitting: Nurse Practitioner

## 2022-05-10 ENCOUNTER — Encounter: Payer: Self-pay | Admitting: Oncology

## 2022-05-10 ENCOUNTER — Inpatient Hospital Stay: Payer: Commercial Managed Care - HMO | Admitting: Nutrition

## 2022-05-10 ENCOUNTER — Telehealth: Payer: Commercial Managed Care - HMO

## 2022-05-12 ENCOUNTER — Inpatient Hospital Stay: Payer: Commercial Managed Care - HMO

## 2022-05-12 ENCOUNTER — Encounter: Payer: Self-pay | Admitting: Oncology

## 2022-05-17 ENCOUNTER — Encounter: Payer: Self-pay | Admitting: Oncology

## 2022-05-18 ENCOUNTER — Inpatient Hospital Stay: Payer: Commercial Managed Care - HMO | Admitting: Oncology

## 2022-05-18 ENCOUNTER — Inpatient Hospital Stay: Payer: Commercial Managed Care - HMO

## 2022-05-18 VITALS — BP 133/70 | HR 98 | Temp 98.1°F | Resp 18 | Ht 66.0 in | Wt 250.0 lb

## 2022-05-18 DIAGNOSIS — C182 Malignant neoplasm of ascending colon: Secondary | ICD-10-CM

## 2022-05-18 DIAGNOSIS — D509 Iron deficiency anemia, unspecified: Secondary | ICD-10-CM

## 2022-05-18 LAB — CMP (CANCER CENTER ONLY)
ALT: 20 U/L (ref 0–44)
AST: 23 U/L (ref 15–41)
Albumin: 3.5 g/dL (ref 3.5–5.0)
Alkaline Phosphatase: 204 U/L — ABNORMAL HIGH (ref 38–126)
Anion gap: 11 (ref 5–15)
BUN: 8 mg/dL (ref 6–20)
CO2: 23 mmol/L (ref 22–32)
Calcium: 8.9 mg/dL (ref 8.9–10.3)
Chloride: 102 mmol/L (ref 98–111)
Creatinine: 0.73 mg/dL (ref 0.61–1.24)
GFR, Estimated: >60 mL/min (ref >60)
Glucose, Bld: 221 mg/dL — ABNORMAL HIGH (ref 70–99)
Potassium: 3.8 mmol/L (ref 3.5–5.1)
Sodium: 136 mmol/L (ref 135–145)
Total Bilirubin: 0.6 mg/dL (ref 0.3–1.2)
Total Protein: 7.5 g/dL (ref 6.5–8.1)

## 2022-05-18 LAB — CBC WITH DIFFERENTIAL (CANCER CENTER ONLY)
Abs Immature Granulocytes: 0.03 10*3/uL (ref 0.00–0.07)
Basophils Absolute: 0 10*3/uL (ref 0.0–0.1)
Basophils Relative: 1 %
Eosinophils Absolute: 0.4 10*3/uL (ref 0.0–0.5)
Eosinophils Relative: 7 %
HCT: 36.2 % — ABNORMAL LOW (ref 39.0–52.0)
Hemoglobin: 10.6 g/dL — ABNORMAL LOW (ref 13.0–17.0)
Immature Granulocytes: 1 %
Lymphocytes Relative: 19 %
Lymphs Abs: 1.1 10*3/uL (ref 0.7–4.0)
MCH: 21.8 pg — ABNORMAL LOW (ref 26.0–34.0)
MCHC: 29.3 g/dL — ABNORMAL LOW (ref 30.0–36.0)
MCV: 74.5 fL — ABNORMAL LOW (ref 80.0–100.0)
Monocytes Absolute: 0.5 10*3/uL (ref 0.1–1.0)
Monocytes Relative: 8 %
Neutro Abs: 4 10*3/uL (ref 1.7–7.7)
Neutrophils Relative %: 64 %
Platelet Count: 347 10*3/uL (ref 150–400)
RBC: 4.86 MIL/uL (ref 4.22–5.81)
RDW: 16.4 % — ABNORMAL HIGH (ref 11.5–15.5)
WBC Count: 6 10*3/uL (ref 4.0–10.5)
nRBC: 0 % (ref 0.0–0.2)

## 2022-05-18 LAB — FERRITIN: Ferritin: 53 ng/mL (ref 24–336)

## 2022-05-18 NOTE — Progress Notes (Signed)
[facility] OFFICE PROGRESS NOTE   Diagnosis: Colon cancer, iron deficiency anemia  INTERVAL HISTORY:   [name] returns as scheduled.  He completed another treatment with IV iron on 05/05/2022.  He tolerated the iron well.  He continues to have burning of the tongue.  He developed a cough and sore throat 2 weeks ago.  He was diagnosed with COVID-19 on 05/09/2022.  He reports completing a course of Paxlovid.  He continues to have altered taste.  He has persistent nausea and upper abdominal discomfort.  He is scheduled for a diagnostic laparoscopy by Dr. Marcello Moores on 05/26/2022.  Objective:  Vital signs in last 24 hours:  Blood pressure 133/70, pulse 98, temperature 98.1 F (36.7 C), temperature source Oral, resp. rate 18, height 5' 6"  (1.676 m), weight 250 lb (113.4 kg), SpO2 100 %.    HEENT: Mild erythema of the tongue, pharynx without erythema or exudate Resp: Lungs clear bilaterally Cardio: Regular rate and rhythm GI: Nontender, no mass, no hepatosplenomegaly Vascular: No leg edema  Lab Results:  Lab Results  Component Value Date   WBC 6.0 05/18/2022   HGB 10.6 (L) 05/18/2022   HCT 36.2 (L) 05/18/2022   MCV 74.5 (L) 05/18/2022   PLT 347 05/18/2022   NEUTROABS 4.0 05/18/2022    CMP  Lab Results  Component Value Date   NA 136 05/18/2022   K 3.8 05/18/2022   CL 102 05/18/2022   CO2 23 05/18/2022   GLUCOSE 221 (H) 05/18/2022   BUN 8 05/18/2022   CREATININE 0.73 05/18/2022   CALCIUM 8.9 05/18/2022   PROT 7.5 05/18/2022   ALBUMIN 3.5 05/18/2022   AST 23 05/18/2022   ALT 20 05/18/2022   ALKPHOS 204 (H) 05/18/2022   BILITOT 0.6 05/18/2022   GFRNONAA >60 05/18/2022   GFRAA >60 05/10/2020    Lab Results  Component Value Date   CEA1 1.08 03/07/2021   CEA 19.56 (H) 04/27/2022    Medications: I have reviewed the patient's current medications.   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 secondary to nausea (patient discontinued Xeloda on day 11 due to colonoscopy) Colonoscopy 07/09/2020-nodular mucosa at the colonic anastomosis, biopsied; patent end-to-side ileocolonic anastomosis characterized by healthy-appearing mucosa and visible sutures; nonbleeding internal hemorrhoids, biopsy with benign ulcerated anastomotic mucosa with granulation tissue Cycle 4 adjuvant Xeloda 07/19/2020, 2000 mg every morning and 1500 mg every afternoon Cycle 5 adjuvant Xeloda 08/09/2020 Cycle 6 adjuvant Xeloda 08/31/2019 Cycle 7 adjuvant Xeloda 09/20/2020 Cycle 8 adjuvant Xeloda 10/11/2020 CTs 03/07/2021-no evidence of recurrent disease, hepatic steatosis, slight wall thickening at the junction of the descending and horizontal duodenum Mild elevation of CEA 2023 12/01/2021-Guardant Reveal-ct DNA detected PET 12/30/2021-hypermetabolic right liver lesion, hypermetabolic area in a loop of mid small bowel with an SUV of 12.5, review of PET images at GI tumor conference 01/11/2022-small bowel uptake felt to be a benign finding MRI liver 01/10/2022-solitary 1.2 cm right liver lesion between segments 7 and 8 compatible with metastasis, subtle changes suggesting cirrhosis, hepatic steatosis CT enteroscopy 02/02/2022-small bowel loop with hypermetabolic activity on PET continues to  have a bandlike density along the margin felt to represent a diverticulum or adjacent nodal tissue.  Small bowel tumor not excluded.  3-4 mm left omental nodule 02/08/2022-biopsy and ablation of solitary liver lesion,  adenocarcinoma consistent with metastatic colorectal adenocarcinoma CTs 03/31/2022-unchanged circumstantial soft tissue thickening surrounding a loop of mid to distal small bowel with an adjacent nodular focus measuring 1.2 cm.,  Ablation cavity in segment 7, no other evidence of metastatic disease Microcytic anemia secondary to #1-improved Colon polyps-sessile serrated adenoma of the descending colon, tubular adenomas with focal high-grade dysplasia of the transverse colon on colonoscopy 02/12/2020, tubular adenoma of the mid ascending colon on the surgical specimen 04/12/2020 Family history of colon cancer Depression Anxiety/panic attacks Hypertension Hyperlipidemia Nausea secondary to Xeloda?-Resolved Gastroesophageal reflux disease-improved following discontinuation of Xeloda Anemia, microcytic; ferritin 6 01/24/2022; stool positive for blood x3 01/25/2022; 02/02/2022 CT abdomen/pelvis enterography-bowel loop demonstrating hypermetabolic activity continues to have a bandlike density along its margin possibly representing a diverticulum or adjacent nodal tissue, difficult to exclude small bowel tumor, 3 x 4 mm left omental nodule or lymph node, known right hepatic lobe tumor is occult on CT. Venofer weekly x3 beginning 03/03/2022 Negative capsule endoscopy 03/09/2022 Venofer 03/03/2022 for 3 weekly doses Venofer 05/03/2022, 05/19/2022 12.  Anterior left neck nodule 03/17/2022-cyst?,  Lymph node?     Disposition: [name] has a history of metastatic colon cancer.  He continues to have upper abdominal discomfort and nausea.  I suspect his symptoms are related to the small bowel process in the upper abdomen.  He is scheduled for a diagnostic laparoscopy next week.  He has iron deficiency anemia, likely secondary to GI bleeding from the small bowel.  He has received IV iron.  The ferritin is in the low normal range today.  The ferritin was low on 05/03/2022.  He will complete another treatment with IV iron  today.  [name] will be scheduled for an office visit after the diagnostic laparoscopy.  Betsy Coder, MD  05/18/2022  12:28 PM

## 2022-05-19 ENCOUNTER — Encounter: Payer: Self-pay | Admitting: Oncology

## 2022-05-19 ENCOUNTER — Inpatient Hospital Stay: Payer: Commercial Managed Care - HMO

## 2022-05-22 ENCOUNTER — Encounter: Payer: Self-pay | Admitting: Oncology

## 2022-05-22 NOTE — Patient Instructions (Addendum)
DUE TO COVID-19 ONLY TWO VISITORS  (aged 57 and older)  ARE ALLOWED TO COME WITH YOU AND STAY IN THE WAITING ROOM ONLY DURING PRE OP AND PROCEDURE.   **NO VISITORS ARE ALLOWED IN THE SHORT STAY AREA OR RECOVERY ROOM!!**  IF YOU WILL BE ADMITTED INTO THE HOSPITAL YOU ARE ALLOWED ONLY FOUR SUPPORT PEOPLE DURING VISITATION HOURS ONLY (7 AM -8PM)   The support person(s) must pass our screening, gel in and out, and wear a mask at all times, including in the patient's room. Patients must also wear a mask when staff or their support person are in the room. Visitors GUEST BADGE MUST BE WORN VISIBLY  One adult visitor may remain with you overnight and MUST be in the room by 8 P.M.     Your procedure is scheduled on: 05/26/22   Report to [facility] Main Entrance    Report to admitting at : 8:15 AM   Call this number if you have problems the morning of surgery [phone]   Eat a light diet the day before surgery.  Examples including soups, broths, toast, yogurt, mashed potatoes.  Things to avoid include carbonated beverages (fizzy beverages), raw fruits and raw vegetables, or beans.   If your bowels are filled with gas, your surgeon will have difficulty visualizing your pelvic organs which increases your surgical risks.  Do not eat food :After Midnight.   After Midnight you may have the following liquids until : 8:15 AM DAY OF SURGERY  Water Black Coffee (sugar ok, NO MILK/CREAM OR CREAMERS)  Tea (sugar ok, NO MILK/CREAM OR CREAMERS) regular and decaf                             Plain Jell-O (NO RED)                                           Fruit ices (not with fruit pulp, NO RED)                                     Popsicles (NO RED)                                                                  Juice: apple, WHITE grape, WHITE cranberry Sports drinks like Gatorade (NO RED)             FOLLOW BOWEL PREP AND ANY ADDITIONAL PRE OP INSTRUCTIONS YOU RECEIVED FROM YOUR SURGEON'S  OFFICE!!!   Oral Hygiene is also important to reduce your risk of infection.                                    Remember - BRUSH YOUR TEETH THE MORNING OF SURGERY WITH YOUR REGULAR TOOTHPASTE   Do NOT smoke after Midnight   Take these medicines the morning of surgery with A SIP OF WATER: sertraline,carvedilol,amlodipine,omeprazole. Alprazolam,promethazine as needed.  DO NOT TAKE ANY ORAL DIABETIC MEDICATIONS DAY  OF YOUR SURGERY  Bring CPAP mask and tubing day of surgery.                              You may not have any metal on your body including hair pins, jewelry, and body piercing             Do not wear lotions, powders, perfumes/cologne, or deodorant              Men may shave face and neck.   Do not bring valuables to the hospital. [place].   Contacts, dentures or bridgework may not be worn into surgery.   Bring small overnight bag day of surgery.   DO NOT [place]. PHARMACY WILL DISPENSE MEDICATIONS LISTED ON YOUR MEDICATION LIST TO YOU DURING YOUR ADMISSION [place]!    Patients discharged on the day of surgery will not be allowed to drive home.  Someone NEEDS to stay with you for the first 24 hours after anesthesia.   Special Instructions: Bring a copy of your healthcare power of attorney and living will documents         the day of surgery if you haven't scanned them before.              Please read over the following fact sheets you were given: IF YOU HAVE QUESTIONS ABOUT YOUR PRE-OP INSTRUCTIONS PLEASE CALL [phone]     [facility] - Preparing for Surgery Before surgery, you can play an important role.  Because skin is not sterile, your skin needs to be as free of germs as possible.  You can reduce the number of germs on your skin by washing with CHG (chlorahexidine gluconate) soap before surgery.  CHG is an antiseptic cleaner which kills germs and bonds with the skin to  continue killing germs even after washing. Please DO NOT use if you have an allergy to CHG or antibacterial soaps.  If your skin becomes reddened/irritated stop using the CHG and inform your nurse when you arrive at Short Stay. Do not shave (including legs and underarms) for at least 48 hours prior to the first CHG shower.  You may shave your face/neck. Please follow these instructions carefully:  1.  Shower with CHG Soap the night before surgery and the  morning of Surgery.  2.  If you choose to wash your hair, wash your hair first as usual with your  normal  shampoo.  3.  After you shampoo, rinse your hair and body thoroughly to remove the  shampoo.                           4.  Use CHG as you would any other liquid soap.  You can apply chg directly  to the skin and wash                       Gently with a scrungie or clean washcloth.  5.  Apply the CHG Soap to your body ONLY FROM THE NECK DOWN.   Do not use on face/ open                           Wound or  open sores. Avoid contact with eyes, ears mouth and genitals (private parts).                       Wash face,  Genitals (private parts) with your normal soap.             6.  Wash thoroughly, paying special attention to the area where your surgery  will be performed.  7.  Thoroughly rinse your body with warm water from the neck down.  8.  DO NOT shower/wash with your normal soap after using and rinsing off  the CHG Soap.                9.  Pat yourself dry with a clean towel.            10.  Wear clean pajamas.            11.  Place clean sheets on your bed the night of your first shower and do not  sleep with pets. Day of Surgery : Do not apply any lotions/deodorants the morning of surgery.  Please wear clean clothes to the hospital/surgery center.  FAILURE TO FOLLOW THESE INSTRUCTIONS MAY RESULT IN THE CANCELLATION OF YOUR SURGERY PATIENT SIGNATURE_________________________________  NURSE  SIGNATURE__________________________________  ________________________________________________________________________

## 2022-05-23 ENCOUNTER — Encounter (HOSPITAL_COMMUNITY)
Admission: RE | Admit: 2022-05-23 | Discharge: 2022-05-23 | Disposition: A | Discharge status: Home / Self Care | Payer: Commercial Managed Care - HMO | Source: Ambulatory Visit | Attending: General Surgery | Admitting: General Surgery

## 2022-05-23 ENCOUNTER — Encounter (HOSPITAL_COMMUNITY): Payer: Self-pay

## 2022-05-23 ENCOUNTER — Other Ambulatory Visit: Payer: Self-pay

## 2022-05-23 VITALS — BP 133/77 | HR 102 | Temp 98.4°F | Ht 66.0 in | Wt 247.0 lb

## 2022-05-23 DIAGNOSIS — Z01812 Encounter for preprocedural laboratory examination: Secondary | ICD-10-CM | POA: Insufficient documentation

## 2022-05-23 DIAGNOSIS — E119 Type 2 diabetes mellitus without complications: Secondary | ICD-10-CM

## 2022-05-23 LAB — GLUCOSE, CAPILLARY: Glucose-Capillary: 263 mg/dL — ABNORMAL HIGH (ref 70–99)

## 2022-05-23 LAB — HEMOGLOBIN A1C
Hgb A1c MFr Bld: 7 % — ABNORMAL HIGH (ref 4.8–5.6)
Mean Plasma Glucose: 154.2 mg/dL

## 2022-05-23 NOTE — Progress Notes (Addendum)
For Short Stay: [place] appointment date: Date of COVID positive in last 84 days:  Bowel Prep reminder:   For Anesthesia: PCP - Dr. Antony Contras Cardiologist -   Chest x-ray - 03/13/22 EKG - 01/30/22 Stress Test -  ECHO -  Cardiac Cath -  Pacemaker/ICD device last checked: Pacemaker orders received: Device Rep notified:  Spinal Cord Stimulator:  Sleep Study -  CPAP -   Fasting Blood Sugar - N/A Checks Blood Sugar __0___ times a day Date and result of last Hgb A1c- 7.4: 01/30/22  Blood Thinner Instructions: Aspirin Instructions: Last Dose:  Activity level: Can go up a flight of stairs and activities of daily living without stopping and without chest pain and/or shortness of breath   Able to exercise without chest pain and/or shortness of breath   Unable to go up a flight of stairs without chest pain and/or shortness of breath     Anesthesia review: Hx: DIA,HTN  Patient denies shortness of breath, fever, cough and chest pain at PAT appointment   Patient verbalized understanding of instructions that were given to them at the PAT appointment. Patient was also instructed that they will need to review over the PAT instructions again at home before surgery.

## 2022-05-24 ENCOUNTER — Inpatient Hospital Stay: Payer: Commercial Managed Care - HMO

## 2022-05-25 ENCOUNTER — Encounter: Payer: Self-pay | Admitting: Oncology

## 2022-05-25 ENCOUNTER — Ambulatory Visit: Payer: Commercial Managed Care - HMO | Admitting: Psychologist

## 2022-05-26 ENCOUNTER — Encounter (HOSPITAL_COMMUNITY): Admission: AD | Disposition: A | Discharge status: Home / Self Care | Payer: Self-pay | Source: Home / Self Care

## 2022-05-26 ENCOUNTER — Inpatient Hospital Stay (HOSPITAL_COMMUNITY)
Admission: AD | Admit: 2022-05-26 | Discharge: 2022-05-28 | DRG: 330 | Disposition: A | Discharge status: Home / Self Care | Payer: Commercial Managed Care - HMO | Attending: Surgery | Admitting: Surgery

## 2022-05-26 ENCOUNTER — Ambulatory Visit (HOSPITAL_COMMUNITY): Payer: Commercial Managed Care - HMO | Admitting: Anesthesiology

## 2022-05-26 ENCOUNTER — Other Ambulatory Visit: Payer: Self-pay

## 2022-05-26 ENCOUNTER — Encounter (HOSPITAL_COMMUNITY): Payer: Self-pay | Admitting: General Surgery

## 2022-05-26 DIAGNOSIS — Z888 Allergy status to other drugs, medicaments and biological substances status: Secondary | ICD-10-CM

## 2022-05-26 DIAGNOSIS — C787 Secondary malignant neoplasm of liver and intrahepatic bile duct: Secondary | ICD-10-CM | POA: Diagnosis present

## 2022-05-26 DIAGNOSIS — K219 Gastro-esophageal reflux disease without esophagitis: Secondary | ICD-10-CM | POA: Diagnosis present

## 2022-05-26 DIAGNOSIS — K6389 Other specified diseases of intestine: Secondary | ICD-10-CM

## 2022-05-26 DIAGNOSIS — F32A Depression, unspecified: Secondary | ICD-10-CM | POA: Diagnosis present

## 2022-05-26 DIAGNOSIS — C179 Malignant neoplasm of small intestine, unspecified: Secondary | ICD-10-CM | POA: Diagnosis present

## 2022-05-26 DIAGNOSIS — E119 Type 2 diabetes mellitus without complications: Secondary | ICD-10-CM | POA: Diagnosis present

## 2022-05-26 DIAGNOSIS — G4730 Sleep apnea, unspecified: Secondary | ICD-10-CM

## 2022-05-26 DIAGNOSIS — M1990 Unspecified osteoarthritis, unspecified site: Secondary | ICD-10-CM | POA: Diagnosis present

## 2022-05-26 DIAGNOSIS — Z87442 Personal history of urinary calculi: Secondary | ICD-10-CM | POA: Diagnosis not present

## 2022-05-26 DIAGNOSIS — K56600 Partial intestinal obstruction, unspecified as to cause: Secondary | ICD-10-CM | POA: Diagnosis not present

## 2022-05-26 DIAGNOSIS — I10 Essential (primary) hypertension: Secondary | ICD-10-CM | POA: Diagnosis present

## 2022-05-26 DIAGNOSIS — Z85038 Personal history of other malignant neoplasm of large intestine: Secondary | ICD-10-CM

## 2022-05-26 DIAGNOSIS — Z9049 Acquired absence of other specified parts of digestive tract: Secondary | ICD-10-CM

## 2022-05-26 DIAGNOSIS — F419 Anxiety disorder, unspecified: Secondary | ICD-10-CM | POA: Diagnosis present

## 2022-05-26 DIAGNOSIS — R112 Nausea with vomiting, unspecified: Secondary | ICD-10-CM

## 2022-05-26 HISTORY — PX: LAPAROSCOPY: SHX197

## 2022-05-26 HISTORY — PX: BOWEL RESECTION: SHX1257

## 2022-05-26 LAB — COMPREHENSIVE METABOLIC PANEL
ALT: 21 U/L (ref 0–44)
AST: 29 U/L (ref 15–41)
Albumin: 3 g/dL — ABNORMAL LOW (ref 3.5–5.0)
Alkaline Phosphatase: 192 U/L — ABNORMAL HIGH (ref 38–126)
Anion gap: 7 (ref 5–15)
BUN: 7 mg/dL (ref 6–20)
CO2: 24 mmol/L (ref 22–32)
Calcium: 8.4 mg/dL — ABNORMAL LOW (ref 8.9–10.3)
Chloride: 105 mmol/L (ref 98–111)
Creatinine, Ser: 0.68 mg/dL (ref 0.61–1.24)
GFR, Estimated: >60 mL/min (ref >60)
Glucose, Bld: 150 mg/dL — ABNORMAL HIGH (ref 70–99)
Potassium: 3.8 mmol/L (ref 3.5–5.1)
Sodium: 136 mmol/L (ref 135–145)
Total Bilirubin: 0.9 mg/dL (ref 0.3–1.2)
Total Protein: 7.5 g/dL (ref 6.5–8.1)

## 2022-05-26 LAB — CBC WITH DIFFERENTIAL/PLATELET
Abs Immature Granulocytes: 0.03 10*3/uL (ref 0.00–0.07)
Basophils Absolute: 0.1 10*3/uL (ref 0.0–0.1)
Basophils Relative: 1 %
Eosinophils Absolute: 0.4 10*3/uL (ref 0.0–0.5)
Eosinophils Relative: 6 %
HCT: 33.3 % — ABNORMAL LOW (ref 39.0–52.0)
Hemoglobin: 9.8 g/dL — ABNORMAL LOW (ref 13.0–17.0)
Immature Granulocytes: 1 %
Lymphocytes Relative: 19 %
Lymphs Abs: 1.2 10*3/uL (ref 0.7–4.0)
MCH: 21.9 pg — ABNORMAL LOW (ref 26.0–34.0)
MCHC: 29.4 g/dL — ABNORMAL LOW (ref 30.0–36.0)
MCV: 74.3 fL — ABNORMAL LOW (ref 80.0–100.0)
Monocytes Absolute: 0.7 10*3/uL (ref 0.1–1.0)
Monocytes Relative: 10 %
Neutro Abs: 4.2 10*3/uL (ref 1.7–7.7)
Neutrophils Relative %: 63 %
Platelets: 358 10*3/uL (ref 150–400)
RBC: 4.48 MIL/uL (ref 4.22–5.81)
RDW: 15.9 % — ABNORMAL HIGH (ref 11.5–15.5)
WBC: 6.5 10*3/uL (ref 4.0–10.5)
nRBC: 0 % (ref 0.0–0.2)

## 2022-05-26 LAB — GLUCOSE, CAPILLARY
Glucose-Capillary: 143 mg/dL — ABNORMAL HIGH (ref 70–99)
Glucose-Capillary: 207 mg/dL — ABNORMAL HIGH (ref 70–99)

## 2022-05-26 SURGERY — LAPAROSCOPY, DIAGNOSTIC
Anesthesia: General | Site: Abdomen

## 2022-05-26 MED ORDER — ORAL CARE MOUTH RINSE: 15.0000 mL | Freq: Once | OROMUCOSAL | Status: AC

## 2022-05-26 MED ORDER — PHENYLEPHRINE 80 MCG/ML (10ML) SYRINGE FOR IV PUSH (FOR BLOOD PRESSURE SUPPORT)
PREFILLED_SYRINGE | INTRAVENOUS | Status: AC
  Filled 2022-05-26: qty 10

## 2022-05-26 MED ORDER — BUPIVACAINE-EPINEPHRINE (PF) 0.25% -1:200000 IJ SOLN
INTRAMUSCULAR | Status: AC
  Filled 2022-05-26: qty 30

## 2022-05-26 MED ORDER — SUGAMMADEX SODIUM 500 MG/5ML IV SOLN
INTRAVENOUS | Status: DC | PRN
  Administered 2022-05-26: 250 mg via INTRAVENOUS

## 2022-05-26 MED ORDER — BUPIVACAINE LIPOSOME 1.3 % IJ SUSP: 20.0000 mL | Freq: Once | INTRAMUSCULAR | Status: DC

## 2022-05-26 MED ORDER — OXYCODONE HCL 5 MG PO TABS
ORAL_TABLET | ORAL | Status: AC
  Administered 2022-05-26: 5 mg via ORAL
  Filled 2022-05-26: qty 1

## 2022-05-26 MED ORDER — 0.9 % SODIUM CHLORIDE (POUR BTL) OPTIME
TOPICAL | Status: DC | PRN
  Administered 2022-05-26: 1500 mL

## 2022-05-26 MED ORDER — LIDOCAINE 2% (20 MG/ML) 5 ML SYRINGE
INTRAMUSCULAR | Status: DC | PRN
  Administered 2022-05-26: 100 mg via INTRAVENOUS

## 2022-05-26 MED ORDER — ROCURONIUM BROMIDE 10 MG/ML (PF) SYRINGE
PREFILLED_SYRINGE | INTRAVENOUS | Status: DC | PRN
  Administered 2022-05-26: 100 mg via INTRAVENOUS

## 2022-05-26 MED ORDER — SERTRALINE HCL 100 MG PO TABS
100.0000 mg | ORAL_TABLET | Freq: Two times a day (BID) | ORAL | Status: DC
  Administered 2022-05-26 – 2022-05-28 (×5): 100 mg via ORAL
  Filled 2022-05-26 (×5): qty 1

## 2022-05-26 MED ORDER — DOCUSATE SODIUM 100 MG PO CAPS
100.0000 mg | ORAL_CAPSULE | Freq: Two times a day (BID) | ORAL | Status: DC
  Administered 2022-05-26 – 2022-05-28 (×3): 100 mg via ORAL
  Filled 2022-05-26 (×3): qty 1

## 2022-05-26 MED ORDER — ATORVASTATIN CALCIUM 40 MG PO TABS
40.0000 mg | ORAL_TABLET | Freq: Every day | ORAL | Status: DC
  Administered 2022-05-26 – 2022-05-28 (×3): 40 mg via ORAL
  Filled 2022-05-26 (×3): qty 1

## 2022-05-26 MED ORDER — ONDANSETRON HCL 4 MG/2ML IJ SOLN
INTRAMUSCULAR | Status: AC
  Filled 2022-05-26: qty 2

## 2022-05-26 MED ORDER — SUCCINYLCHOLINE CHLORIDE 200 MG/10ML IV SOSY
PREFILLED_SYRINGE | INTRAVENOUS | Status: AC
  Filled 2022-05-26: qty 10

## 2022-05-26 MED ORDER — MIDAZOLAM HCL 2 MG/2ML IJ SOLN
INTRAMUSCULAR | Status: AC
  Filled 2022-05-26: qty 2

## 2022-05-26 MED ORDER — AMLODIPINE BESYLATE 5 MG PO TABS
5.0000 mg | ORAL_TABLET | Freq: Every day | ORAL | Status: DC
  Administered 2022-05-26 – 2022-05-28 (×3): 5 mg via ORAL
  Filled 2022-05-26 (×3): qty 1

## 2022-05-26 MED ORDER — ENOXAPARIN SODIUM 40 MG/0.4ML IJ SOSY
40.0000 mg | PREFILLED_SYRINGE | INTRAMUSCULAR | Status: DC
  Administered 2022-05-27: 40 mg via SUBCUTANEOUS
  Filled 2022-05-26 (×2): qty 0.4

## 2022-05-26 MED ORDER — SODIUM CHLORIDE 0.9 % IV SOLN
2.0000 g | Freq: Two times a day (BID) | INTRAVENOUS | Status: AC
  Administered 2022-05-26: 2 g via INTRAVENOUS
  Filled 2022-05-26: qty 2

## 2022-05-26 MED ORDER — LIDOCAINE HCL (PF) 2 % IJ SOLN
INTRAMUSCULAR | Status: AC
  Filled 2022-05-26: qty 5

## 2022-05-26 MED ORDER — ONDANSETRON HCL 4 MG PO TABS: 4.0000 mg | ORAL_TABLET | Freq: Four times a day (QID) | ORAL | Status: DC | PRN

## 2022-05-26 MED ORDER — CHLORHEXIDINE GLUCONATE 0.12 % MT SOLN
15.0000 mL | Freq: Once | OROMUCOSAL | Status: AC
  Administered 2022-05-26: 15 mL via OROMUCOSAL

## 2022-05-26 MED ORDER — KETAMINE HCL 50 MG/5ML IJ SOSY
PREFILLED_SYRINGE | INTRAMUSCULAR | Status: AC
  Filled 2022-05-26: qty 5

## 2022-05-26 MED ORDER — DEXAMETHASONE SODIUM PHOSPHATE 10 MG/ML IJ SOLN
INTRAMUSCULAR | Status: DC | PRN
  Administered 2022-05-26: 10 mg via INTRAVENOUS

## 2022-05-26 MED ORDER — PANTOPRAZOLE SODIUM 40 MG PO TBEC
40.0000 mg | DELAYED_RELEASE_TABLET | Freq: Every day | ORAL | Status: DC
  Administered 2022-05-26 – 2022-05-28 (×3): 40 mg via ORAL
  Filled 2022-05-26 (×3): qty 1

## 2022-05-26 MED ORDER — OXYCODONE HCL 5 MG/5ML PO SOLN: 5.0000 mg | Freq: Once | ORAL | Status: AC | PRN

## 2022-05-26 MED ORDER — HYDROMORPHONE HCL 1 MG/ML IJ SOLN
0.5000 mg | INTRAMUSCULAR | Status: DC | PRN
  Administered 2022-05-26: 0.5 mg via INTRAVENOUS
  Filled 2022-05-26: qty 0.5

## 2022-05-26 MED ORDER — HYDROMORPHONE HCL 1 MG/ML IJ SOLN
0.2500 mg | INTRAMUSCULAR | Status: DC | PRN
  Administered 2022-05-26: 0.25 mg via INTRAVENOUS

## 2022-05-26 MED ORDER — MEPERIDINE HCL 50 MG/ML IJ SOLN: 6.2500 mg | INTRAMUSCULAR | Status: DC | PRN

## 2022-05-26 MED ORDER — ALPRAZOLAM 0.5 MG PO TABS
1.0000 mg | ORAL_TABLET | Freq: Three times a day (TID) | ORAL | Status: DC | PRN
  Administered 2022-05-26 – 2022-05-28 (×4): 1 mg via ORAL
  Filled 2022-05-26 (×4): qty 2

## 2022-05-26 MED ORDER — KETAMINE HCL 10 MG/ML IJ SOLN
INTRAMUSCULAR | Status: DC | PRN
  Administered 2022-05-26: 50 mg via INTRAVENOUS

## 2022-05-26 MED ORDER — AMISULPRIDE (ANTIEMETIC) 5 MG/2ML IV SOLN
10.0000 mg | Freq: Once | INTRAVENOUS | Status: AC | PRN
  Administered 2022-05-26: 10 mg via INTRAVENOUS

## 2022-05-26 MED ORDER — PROPOFOL 10 MG/ML IV BOLUS
INTRAVENOUS | Status: AC
  Filled 2022-05-26: qty 20

## 2022-05-26 MED ORDER — DEXAMETHASONE SODIUM PHOSPHATE 10 MG/ML IJ SOLN
INTRAMUSCULAR | Status: AC
  Filled 2022-05-26: qty 1

## 2022-05-26 MED ORDER — ONDANSETRON HCL 4 MG/2ML IJ SOLN: 4.0000 mg | Freq: Four times a day (QID) | INTRAMUSCULAR | Status: DC | PRN

## 2022-05-26 MED ORDER — PROMETHAZINE HCL 25 MG/ML IJ SOLN: 6.2500 mg | INTRAMUSCULAR | Status: DC | PRN

## 2022-05-26 MED ORDER — SIMETHICONE 80 MG PO CHEW
40.0000 mg | CHEWABLE_TABLET | Freq: Four times a day (QID) | ORAL | Status: DC | PRN
  Administered 2022-05-27: 40 mg via ORAL
  Filled 2022-05-26: qty 1

## 2022-05-26 MED ORDER — TRAMADOL HCL 50 MG PO TABS
50.0000 mg | ORAL_TABLET | Freq: Four times a day (QID) | ORAL | Status: DC | PRN
  Administered 2022-05-26: 50 mg via ORAL
  Administered 2022-05-27 – 2022-05-28 (×2): 100 mg via ORAL
  Filled 2022-05-26 (×2): qty 2
  Filled 2022-05-26: qty 1

## 2022-05-26 MED ORDER — LACTATED RINGERS IV SOLN: INTRAVENOUS | Status: DC

## 2022-05-26 MED ORDER — FENTANYL CITRATE (PF) 250 MCG/5ML IJ SOLN
INTRAMUSCULAR | Status: DC | PRN
  Administered 2022-05-26: 50 ug via INTRAVENOUS
  Administered 2022-05-26: 100 ug via INTRAVENOUS

## 2022-05-26 MED ORDER — ACETAMINOPHEN 500 MG PO TABS
1000.0000 mg | ORAL_TABLET | Freq: Four times a day (QID) | ORAL | Status: DC
  Administered 2022-05-26 – 2022-05-28 (×8): 1000 mg via ORAL
  Filled 2022-05-26 (×8): qty 2

## 2022-05-26 MED ORDER — KCL IN DEXTROSE-NACL 20-5-0.45 MEQ/L-%-% IV SOLN
INTRAVENOUS | Status: DC
  Filled 2022-05-26 (×3): qty 1000

## 2022-05-26 MED ORDER — ALVIMOPAN 12 MG PO CAPS: 12.0000 mg | ORAL_CAPSULE | Freq: Two times a day (BID) | ORAL | Status: DC

## 2022-05-26 MED ORDER — HYDROMORPHONE HCL 1 MG/ML IJ SOLN
INTRAMUSCULAR | Status: AC
  Filled 2022-05-26: qty 1

## 2022-05-26 MED ORDER — PROPOFOL 10 MG/ML IV BOLUS
INTRAVENOUS | Status: DC | PRN
  Administered 2022-05-26: 200 mg via INTRAVENOUS

## 2022-05-26 MED ORDER — BUPIVACAINE-EPINEPHRINE 0.25% -1:200000 IJ SOLN
INTRAMUSCULAR | Status: DC | PRN
  Administered 2022-05-26: 30 mL

## 2022-05-26 MED ORDER — SUGAMMADEX SODIUM 500 MG/5ML IV SOLN
INTRAVENOUS | Status: AC
  Filled 2022-05-26: qty 5

## 2022-05-26 MED ORDER — AMISULPRIDE (ANTIEMETIC) 5 MG/2ML IV SOLN
INTRAVENOUS | Status: AC
  Filled 2022-05-26: qty 4

## 2022-05-26 MED ORDER — ROCURONIUM BROMIDE 10 MG/ML (PF) SYRINGE
PREFILLED_SYRINGE | INTRAVENOUS | Status: AC
  Filled 2022-05-26: qty 10

## 2022-05-26 MED ORDER — CEFAZOLIN SODIUM-DEXTROSE 2-4 GM/100ML-% IV SOLN
2.0000 g | INTRAVENOUS | Status: AC
  Administered 2022-05-26: 2 g via INTRAVENOUS
  Filled 2022-05-26: qty 100

## 2022-05-26 MED ORDER — ALUM & MAG HYDROXIDE-SIMETH 200-200-20 MG/5ML PO SUSP: 30.0000 mL | Freq: Four times a day (QID) | ORAL | Status: DC | PRN

## 2022-05-26 MED ORDER — OXYCODONE HCL 5 MG PO TABS: 5.0000 mg | ORAL_TABLET | Freq: Once | ORAL | Status: AC | PRN

## 2022-05-26 MED ORDER — BUPIVACAINE LIPOSOME 1.3 % IJ SUSP
INTRAMUSCULAR | Status: DC | PRN
  Administered 2022-05-26: 20 mL

## 2022-05-26 MED ORDER — ONDANSETRON HCL 4 MG/2ML IJ SOLN
INTRAMUSCULAR | Status: DC | PRN
  Administered 2022-05-26: 4 mg via INTRAVENOUS

## 2022-05-26 MED ORDER — SACCHAROMYCES BOULARDII 250 MG PO CAPS
250.0000 mg | ORAL_CAPSULE | Freq: Two times a day (BID) | ORAL | Status: DC
  Administered 2022-05-26 – 2022-05-28 (×5): 250 mg via ORAL
  Filled 2022-05-26 (×5): qty 1

## 2022-05-26 MED ORDER — BUPIVACAINE LIPOSOME 1.3 % IJ SUSP
INTRAMUSCULAR | Status: AC
  Filled 2022-05-26: qty 20

## 2022-05-26 MED ORDER — CARVEDILOL 6.25 MG PO TABS
6.2500 mg | ORAL_TABLET | Freq: Two times a day (BID) | ORAL | Status: DC
  Administered 2022-05-26 – 2022-05-28 (×4): 6.25 mg via ORAL
  Filled 2022-05-26 (×4): qty 1

## 2022-05-26 MED ORDER — FENTANYL CITRATE (PF) 250 MCG/5ML IJ SOLN
INTRAMUSCULAR | Status: AC
  Filled 2022-05-26: qty 5

## 2022-05-26 MED ORDER — LACTATED RINGERS IR SOLN
Status: DC | PRN
  Administered 2022-05-26: 1000 mL

## 2022-05-26 MED ORDER — PHENYLEPHRINE 80 MCG/ML (10ML) SYRINGE FOR IV PUSH (FOR BLOOD PRESSURE SUPPORT)
PREFILLED_SYRINGE | INTRAVENOUS | Status: DC | PRN
  Administered 2022-05-26: 80 ug via INTRAVENOUS

## 2022-05-26 MED ORDER — ENSURE SURGERY PO LIQD
237.0000 mL | Freq: Two times a day (BID) | ORAL | Status: DC
  Administered 2022-05-27 – 2022-05-28 (×3): 237 mL via ORAL

## 2022-05-26 MED ORDER — MIDAZOLAM HCL 2 MG/2ML IJ SOLN
INTRAMUSCULAR | Status: DC | PRN
  Administered 2022-05-26: 2 mg via INTRAVENOUS

## 2022-05-26 MED ORDER — ACETAMINOPHEN 500 MG PO TABS
1000.0000 mg | ORAL_TABLET | ORAL | Status: AC
  Administered 2022-05-26: 1000 mg via ORAL
  Filled 2022-05-26: qty 2

## 2022-05-26 SURGICAL SUPPLY — 64 items
ADH SKN CLS APL DERMABOND .7 (GAUZE/BANDAGES/DRESSINGS)
APPLIER CLIP 5 13 M/L LIGAMAX5 (MISCELLANEOUS)
APPLIER CLIP ROT 10 11.4 M/L (STAPLE)
APR CLP MED LRG 11.4X10 (STAPLE)
APR CLP MED LRG 5 ANG JAW (MISCELLANEOUS)
BAG COUNTER SPONGE SURGICOUNT (BAG) IMPLANT
BAG SPNG CNTER NS LX DISP (BAG)
BLADE EXTENDED COATED 6.5IN (ELECTRODE) IMPLANT
CELLS DAT CNTRL 66122 CELL SVR (MISCELLANEOUS) IMPLANT
CLIP APPLIE 5 13 M/L LIGAMAX5 (MISCELLANEOUS) IMPLANT
CLIP APPLIE ROT 10 11.4 M/L (STAPLE) IMPLANT
COVER MAYO STAND STRL (DRAPES) ×2 IMPLANT
DERMABOND ADVANCED .7 DNX12 (GAUZE/BANDAGES/DRESSINGS) IMPLANT
DRSG OPSITE POSTOP 4X6 (GAUZE/BANDAGES/DRESSINGS) IMPLANT
ELECT REM PT RETURN 15FT ADLT (MISCELLANEOUS) ×2 IMPLANT
GAUZE SPONGE 4X4 12PLY STRL (GAUZE/BANDAGES/DRESSINGS) ×2 IMPLANT
GLOVE BIO SURGEON STRL SZ 6.5 (GLOVE) ×4 IMPLANT
GLOVE BIOGEL PI IND STRL 7.0 (GLOVE) ×2 IMPLANT
GLOVE INDICATOR 6.5 STRL GRN (GLOVE) ×2 IMPLANT
GOWN STRL REUS W/ TWL XL LVL3 (GOWN DISPOSABLE) ×6 IMPLANT
GOWN STRL REUS W/TWL XL LVL3 (GOWN DISPOSABLE) ×4
HANDLE SUCTION POOLE (INSTRUMENTS) IMPLANT
IRRIG SUCT STRYKERFLOW 2 WTIP (MISCELLANEOUS) ×2
IRRIGATION SUCT STRKRFLW 2 WTP (MISCELLANEOUS) ×2 IMPLANT
KIT TURNOVER KIT A (KITS) IMPLANT
PACK COLON (CUSTOM PROCEDURE TRAY) ×2 IMPLANT
RELOAD PROXIMATE 75MM BLUE (ENDOMECHANICALS) ×4 IMPLANT
RELOAD STAPLE 75 3.8 BLU REG (ENDOMECHANICALS) IMPLANT
RETRACTOR WND ALEXIS 18 MED (MISCELLANEOUS) IMPLANT
RTRCTR WOUND ALEXIS 18CM MED (MISCELLANEOUS)
SEALER TISSUE G2 STRG ARTC 35C (ENDOMECHANICALS) IMPLANT
SET TUBE SMOKE EVAC HIGH FLOW (TUBING) ×2 IMPLANT
SLEEVE ADV FIXATION 5X100MM (TROCAR) IMPLANT
SLEEVE Z-THREAD 5X100MM (TROCAR) ×2 IMPLANT
SPIKE FLUID TRANSFER (MISCELLANEOUS) ×2 IMPLANT
STAPLER GUN LINEAR PROX 60 (STAPLE) IMPLANT
STAPLER PROXIMATE 75MM BLUE (STAPLE) IMPLANT
STAPLER VISISTAT 35W (STAPLE) IMPLANT
SUCTION POOLE HANDLE (INSTRUMENTS)
SUT NOVA 1 T20/GS 25DT (SUTURE) IMPLANT
SUT NOVA NAB DX-16 0-1 5-0 T12 (SUTURE) IMPLANT
SUT PDS AB 1 CT1 27 (SUTURE) ×4 IMPLANT
SUT PDS AB 1 TP1 96 (SUTURE) IMPLANT
SUT PROLENE 2 0 KS (SUTURE) IMPLANT
SUT PROLENE 2 0 SH DA (SUTURE) IMPLANT
SUT SILK 2 0 (SUTURE) ×2
SUT SILK 2 0 SH CR/8 (SUTURE) ×2 IMPLANT
SUT SILK 2-0 18XBRD TIE 12 (SUTURE) ×2 IMPLANT
SUT SILK 3 0 (SUTURE) ×2
SUT SILK 3 0 SH CR/8 (SUTURE) ×2 IMPLANT
SUT SILK 3-0 18XBRD TIE 12 (SUTURE) ×2 IMPLANT
SUT VIC AB 2-0 SH 27 (SUTURE) ×2
SUT VIC AB 2-0 SH 27X BRD (SUTURE) IMPLANT
SUT VIC AB 4-0 PS2 18 (SUTURE) IMPLANT
SYS WOUND ALEXIS 18CM MED (MISCELLANEOUS) ×2
SYSTEM WOUND ALEXIS 18CM MED (MISCELLANEOUS) IMPLANT
TOWEL OR 17X26 10 PK STRL BLUE (TOWEL DISPOSABLE) IMPLANT
TOWEL OR NON WOVEN STRL DISP B (DISPOSABLE) ×2 IMPLANT
TRAY FOLEY MTR SLVR 16FR STAT (SET/KITS/TRAYS/PACK) ×2 IMPLANT
TROCAR 11X100 Z THREAD (TROCAR) IMPLANT
TROCAR ADV FIXATION 5X100MM (TROCAR) IMPLANT
TROCAR BALLN 12MMX100 BLUNT (TROCAR) IMPLANT
TROCAR Z-THREAD OPTICAL 5X100M (TROCAR) ×2 IMPLANT
TUBING CONNECTING 10 (TUBING) ×4 IMPLANT

## 2022-05-26 NOTE — Anesthesia Postprocedure Evaluation (Signed)
Anesthesia Post Note  Patient: Anthony Calhoun  Procedure(s) Performed: LAPAROSCOPY DIAGNOSTIC SMALL BOWEL RESECTION (Abdomen)     Patient location during evaluation: PACU Anesthesia Type: General Level of consciousness: awake and alert Pain management: pain level controlled Vital Signs Assessment: post-procedure vital signs reviewed and stable Respiratory status: spontaneous breathing, nonlabored ventilation and respiratory function stable Cardiovascular status: blood pressure returned to baseline and stable Postop Assessment: no apparent nausea or vomiting Anesthetic complications: no   No notable events documented.  Last Vitals:  Vitals:   05/26/22 1245 05/26/22 1246  BP: 131/78 131/78  Pulse: 88 87  Resp: 12 16  Temp:    SpO2: 91% 91%    Last Pain:  Vitals:   05/26/22 1255  TempSrc:   PainSc: 3                  Lynda Rainwater

## 2022-05-26 NOTE — Discharge Instructions (Signed)
SURGERY: POST OP INSTRUCTIONS (Surgery for small bowel obstruction, colon resection, etc)   ######################################################################  EAT Gradually transition to a high fiber diet with a fiber supplement over the next few days after discharge  WALK Walk an hour a day.  Control your pain to do that.    CONTROL PAIN Control pain so that you can walk, sleep, tolerate sneezing/coughing, go up/down stairs.  HAVE A BOWEL MOVEMENT DAILY Keep your bowels regular to avoid problems.  OK to try a laxative to override constipation.  OK to use an antidairrheal to slow down diarrhea.  Call if not better after 2 tries  CALL IF YOU HAVE PROBLEMS/CONCERNS Call if you are still struggling despite following these instructions. Call if you have concerns not answered by these instructions  ######################################################################   DIET Follow a light diet the first few days at home.  Start with a bland diet such as soups, liquids, starchy foods, low fat foods, etc.  If you feel full, bloated, or constipated, stay on a ful liquid or pureed/blenderized diet for a few days until you feel better and no longer constipated. Be sure to drink plenty of fluids every day to avoid getting dehydrated (feeling dizzy, not urinating, etc.). Gradually add a fiber supplement to your diet over the next week.  Gradually get back to a regular solid diet.  Avoid fast food or heavy meals the first week as you are more likely to get nauseated. It is expected for your digestive tract to need a few months to get back to normal.  It is common for your bowel movements and stools to be irregular.  You will have occasional bloating and cramping that should eventually fade away.  Until you are eating solid food normally, off all pain medications, and back to regular activities; your bowels will not be normal. Focus on eating a low-fat, high fiber diet the rest of your life  (See Getting to Good Bowel Health, below).  CARE of your INCISION or WOUND  It is good for closed incisions and even open wounds to be washed every day.  Shower every day.  Short baths are fine.  Wash the incisions and wounds clean with soap & water.    You may leave closed incisions open to air if it is dry.   You may cover the incision with clean gauze & replace it after your daily shower for comfort.  STAPLES: You have skin staples.  Leave them in place & set up an appointment for them to be removed by a surgery office nurse ~10 days after surgery. = 1st week of January 2024    ACTIVITIES as tolerated Start light daily activities --- self-care, walking, climbing stairs-- beginning the day after surgery.  Gradually increase activities as tolerated.  Control your pain to be active.  Stop when you are tired.  Ideally, walk several times a day, eventually an hour a day.   Most people are back to most day-to-day activities in a few weeks.  It takes 4-8 weeks to get back to unrestricted, intense activity. If you can walk 30 minutes without difficulty, it is safe to try more intense activity such as jogging, treadmill, bicycling, low-impact aerobics, swimming, etc. Save the most intensive and strenuous activity for last (Usually 4-8 weeks after surgery) such as sit-ups, heavy lifting, contact sports, etc.  Refrain from any intense heavy lifting or straining until you are off narcotics for pain control.  You will have off days, but things should improve   week-by-week. DO NOT PUSH THROUGH PAIN.  Let pain be your guide: If it hurts to do something, don't do it.  Pain is your body warning you to avoid that activity for another week until the pain goes down. You may drive when you are no longer taking narcotic prescription pain medication, you can comfortably wear a seatbelt, and you can safely make sudden turns/stops to protect yourself without hesitating due to pain. You may have sexual intercourse when it  is comfortable. If it hurts to do something, stop.  MEDICATIONS Take your usually prescribed home medications unless otherwise directed.   Blood thinners:  Usually you can restart any strong blood thinners after the second postoperative day.  It is OK to take aspirin right away.     If you are on strong blood thinners (warfarin/Coumadin, Plavix, Xerelto, Eliquis, Pradaxa, etc), discuss with your surgeon, medicine PCP, and/or cardiologist for instructions on when to restart the blood thinner & if blood monitoring is needed (PT/INR blood check, etc).     PAIN CONTROL Pain after surgery or related to activity is often due to strain/injury to muscle, tendon, nerves and/or incisions.  This pain is usually short-term and will improve in a few months.  To help speed the process of healing and to get back to regular activity more quickly, DO THE FOLLOWING THINGS TOGETHER: Increase activity gradually.  DO NOT PUSH THROUGH PAIN Use Ice and/or Heat Try Gentle Massage and/or Stretching Take over the counter pain medication Take Narcotic prescription pain medication for more severe pain  Good pain control = faster recovery.  It is better to take more medicine to be more active than to stay in bed all day to avoid medications.  Increase activity gradually Avoid heavy lifting at first, then increase to lifting as tolerated over the next 6 weeks. Do not "push through" the pain.  Listen to your body and avoid positions and maneuvers than reproduce the pain.  Wait a few days before trying something more intense Walking an hour a day is encouraged to help your body recover faster and more safely.  Start slowly and stop when getting sore.  If you can walk 30 minutes without stopping or pain, you can try more intense activity (running, jogging, aerobics, cycling, swimming, treadmill, sex, sports, weightlifting, etc.) Remember: If it hurts to do it, then don't do it! Use Ice and/or Heat You will have swelling and  bruising around the incisions.  This will take several weeks to resolve. Ice packs or heating pads (6-8 times a day, 30-60 minutes at a time) will help sooth soreness & bruising. Some people prefer to use ice alone, heat alone, or alternate between ice & heat.  Experiment and see what works best for you.  Consider trying ice for the first few days to help decrease swelling and bruising; then, switch to heat to help relax sore spots and speed recovery. Shower every day.  Short baths are fine.  It feels good!  Keep the incisions and wounds clean with soap & water.   Try Gentle Massage and/or Stretching Massage at the area of pain many times a day Stop if you feel pain - do not overdo it Take over the counter pain medication This helps the muscle and nerve tissues become less irritable and calm down faster Choose ONE of the following over-the-counter anti-inflammatory medications: Acetaminophen 500mg tabs (Tylenol) 1-2 pills with every meal and just before bedtime (avoid if you have liver problems or if you have   acetaminophen in you narcotic prescription) Naproxen 220mg tabs (ex. Aleve, Naprosyn) 1-2 pills twice a day (avoid if you have kidney, stomach, IBD, or bleeding problems) Ibuprofen 200mg tabs (ex. Advil, Motrin) 3-4 pills with every meal and just before bedtime (avoid if you have kidney, stomach, IBD, or bleeding problems) Take with food/snack several times a day as directed for at least 2 weeks to help keep pain / soreness down & more manageable. Take Narcotic prescription pain medication for more severe pain A prescription for strong pain control is often given to you upon discharge (for example: oxycodone/Percocet, hydrocodone/Norco/Vicodin, or tramadol/Ultram) Take your pain medication as prescribed. Be mindful that most narcotic prescriptions contain Tylenol (acetaminophen) as well - avoid taking too much Tylenol. If you are having problems/concerns with the prescription medicine (does  not control pain, nausea, vomiting, rash, itching, etc.), please call us [phone] to see if we need to switch you to a different pain medicine that will work better for you and/or control your side effects better. If you need a refill on your pain medication, you must call the office before 4 pm and on weekdays only.  By federal law, prescriptions for narcotics cannot be called into a pharmacy.  They must be filled out on paper & picked up from our office by the patient or authorized caretaker.  Prescriptions cannot be filled after 4 pm nor on weekends.    WHEN TO CALL US [phone] Severe uncontrolled or worsening pain  Fever over 101 F (38.5 C) Concerns with the incision: Worsening pain, redness, rash/hives, swelling, bleeding, or drainage Reactions / problems with new medications (itching, rash, hives, nausea, etc.) Nausea and/or vomiting Difficulty urinating Difficulty breathing Worsening fatigue, dizziness, lightheadedness, blurred vision Other concerns If you are not getting better after two weeks or are noticing you are getting worse, contact our office [phone] for further advice.  We may need to adjust your medications, re-evaluate you in the office, send you to the emergency room, or see what other things we can do to help. The clinic staff is available to answer your questions during regular business hours (8:30am-5pm).  Please don't hesitate to call and ask to speak to one of our nurses for clinical concerns.    A surgeon from [facility] is always on call at the hospitals 24 hours/day If you have a medical emergency, go to the nearest emergency room or call 911.  FOLLOW UP in our office One the day of your discharge from the hospital (or the next business weekday), please call [facility] to set up or confirm an appointment to see your surgeon in the office for a follow-up appointment.  Usually it is 2-3 weeks after your surgery.   If you  have skin staples at your incision(s), let the office know so we can set up a time in the office for the nurse to remove them (usually around 10 days after surgery). Make sure that you call for appointments the day of discharge (or the next business weekday) from the hospital to ensure a convenient appointment time. IF YOU HAVE DISABILITY OR FAMILY LEAVE FORMS, BRING THEM TO THE OFFICE FOR PROCESSING.  DO NOT GIVE THEM TO YOUR DOCTOR.  [facility] [address], [address], [place], [place]  [address] ? [phone] - Main [phone] - Toll Free,  [phone] - Fax [email or web address]    GETTING TO GOOD BOWEL HEALTH. It is expected for your digestive tract to   need a few months to get back to normal.  It is common for your bowel movements and stools to be irregular.  You will have occasional bloating and cramping that should eventually fade away.  Until you are eating solid food normally, off all pain medications, and back to regular activities; your bowels will not be normal.   Avoiding constipation The goal: ONE SOFT BOWEL MOVEMENT A DAY!    Drink plenty of fluids.  Choose water first. TAKE A FIBER SUPPLEMENT EVERY DAY THE REST OF YOUR LIFE During your first week back home, gradually add back a fiber supplement every day Experiment which form you can tolerate.   There are many forms such as powders, tablets, wafers, gummies, etc Psyllium bran (Metamucil), methylcellulose (Citrucel), Miralax or Glycolax, Benefiber, Flax Seed.  Adjust the dose week-by-week (1/2 dose/day to 6 doses a day) until you are moving your bowels 1-2 times a day.  Cut back the dose or try a different fiber product if it is giving you problems such as diarrhea or bloating. Sometimes a laxative is needed to help jump-start bowels if constipated until the fiber supplement can help regulate your bowels.  If you are tolerating eating & you are farting, it is okay to try a gentle  laxative such as double dose MiraLax, prune juice, or Milk of Magnesia.  Avoid using laxatives too often. Stool softeners can sometimes help counteract the constipating effects of narcotic pain medicines.  It can also cause diarrhea, so avoid using for too long. If you are still constipated despite taking fiber daily, eating solids, and a few doses of laxatives, call our office. Controlling diarrhea Try drinking liquids and eating bland foods for a few days to avoid stressing your intestines further. Avoid dairy products (especially milk & ice cream) for a short time.  The intestines often can lose the ability to digest lactose when stressed. Avoid foods that cause gassiness or bloating.  Typical foods include beans and other legumes, cabbage, broccoli, and dairy foods.  Avoid greasy, spicy, fast foods.  Every person has some sensitivity to other foods, so listen to your body and avoid those foods that trigger problems for you. Probiotics (such as active yogurt, Align, etc) may help repopulate the intestines and colon with normal bacteria and calm down a sensitive digestive tract Adding a fiber supplement gradually can help thicken stools by absorbing excess fluid and retrain the intestines to act more normally.  Slowly increase the dose over a few weeks.  Too much fiber too soon can backfire and cause cramping & bloating. It is okay to try and slow down diarrhea with a few doses of antidiarrheal medicines.   Bismuth subsalicylate (ex. Kayopectate, Pepto Bismol) for a few doses can help control diarrhea.  Avoid if pregnant.   Loperamide (Imodium) can slow down diarrhea.  Start with one tablet (2mg) first.  Avoid if you are having fevers or severe pain.  ILEOSTOMY PATIENTS WILL HAVE CHRONIC DIARRHEA since their colon is not in use.    Drink plenty of liquids.  You will need to drink even more glasses of water/liquid a day to avoid getting dehydrated. Record output from your ileostomy.  Expect to empty  the bag every 3-4 hours at first.  Most people with a permanent ileostomy empty their bag 4-6 times at the least.   Use antidiarrheal medicine (especially Imodium) several times a day to avoid getting dehydrated.  Start with a dose at bedtime & breakfast.  Adjust up or   down as needed.  Increase antidiarrheal medications as directed to avoid emptying the bag more than 8 times a day (every 3 hours). Work with your wound ostomy nurse to learn care for your ostomy.  See ostomy care instructions. TROUBLESHOOTING IRREGULAR BOWELS 1) Start with a soft & bland diet. No spicy, greasy, or fried foods.  2) Avoid gluten/wheat or dairy products from diet to see if symptoms improve. 3) Miralax 17gm or flax seed mixed in 8oz. water or juice-daily. May use 2-4 times a day as needed. 4) Gas-X, Phazyme, etc. as needed for gas & bloating.  5) Prilosec (omeprazole) over-the-counter as needed 6)  Consider probiotics (Align, Activa, etc) to help calm the bowels down  Call your doctor if you are getting worse or not getting better.  Sometimes further testing (cultures, endoscopy, X-ray studies, CT scans, bloodwork, etc.) may be needed to help diagnose and treat the cause of the diarrhea. [facility] [address], [address], [place], [place]  [address] [phone] - Main.    [phone]  - Toll Free.   [phone] - Fax [email or web address]   ###############################   #######################################################  Ostomy Support Information  You've heard that people get along just fine with only one of their eyes, or one of their lungs, or one of their kidneys. But you also know that you have only one intestine and only one bladder, and that leaves you feeling awfully empty, both physically and emotionally: You think no other people go around without part of their intestine with the ends of their intestines sticking out through their abdominal walls.    YOU ARE NOT ALONE.  There are nearly three quarters of a million people in the [place] who have an ostomy; people who have had surgery to remove all or part of their colons or bladders.   There is even a national association, the [employer] with over 350 local affiliated support groups that are organized by volunteers who provide peer support and counseling. [employer] has a toll free telephone num-ber, 800-826-0826 and an educational, interactive website, www.ostomy.org   An ostomy is an opening in the belly (abdominal wall) made by surgery. Ostomates are people who have had this procedure. The opening (stoma) allows the kidney or bowel to grdischarge waste. An external pouch covers the stoma to collect waste. Pouches are are a simple bag and are odor free. Different companies have disposable or reusable pouches to fit one's lifestyle. An ostomy can either be temporary or permanent.   THERE ARE THREE MAIN TYPES OF OSTOMIES Colostomy. A colostomy is a surgically created opening in the large intestine (colon). Ileostomy. An ileostomy is a surgically created opening in the small intestine. Urostomy. A urostomy is a surgically created opening to divert urine away from the bladder.  OSTOMY Care  The following guidelines will make care of your colostomy easier. Keep this information close by for quick reference.  Helpful DIET hints Eat a well-balanced diet including vegetables and fresh fruits. Eat on a regular schedule.  Drink at least 6 to 8 glasses of fluids daily. Eat slowly in a relaxed atmosphere. Chew your food thoroughly. Avoid chewing gum, smoking, and drinking from a straw. This will help decrease the amount of air you swallow, which may help reduce gas. Eating yogurt or drinking buttermilk may help reduce gas.  To control gas at night, do not eat after 8 p.m. This will give your bowel time to quiet down before you go   to bed.  If gas is a problem, you can purchase  Beano. Sprinkle Beano on the first bite of food before eating to reduce gas. It has no flavor and should not change the taste of your food. You can buy Beano over the counter at your local drugstore.  Foods like fish, onions, garlic, broccoli, asparagus, and cabbage produce odor. Although your pouch is odor-proof, if you eat these foods you may notice a stronger odor when emptying your pouch. If this is a concern, you may want to limit these foods in your diet.  If you have an ileostomy, you will have chronic diarrhea & need to drink more liquids to avoid getting dehydrated.  Consider antidiarrheal medicine like imodium (loperamide) or Lomotil to help slow down bowel movements / diarrhea into your ileostomy bag.  GETTING TO GOOD BOWEL HEALTH WITH AN ILEOSTOMY    With the colon bypassed & not in use, you will have small bowel diarrhea.   It is important to thicken & slow your bowel movements down.   The goal: 4-6 small BOWEL MOVEMENTS A DAY It is important to drink plenty of liquids to avoid getting dehydrated  CONTROLLING ILEOSTOMY DIARRHEA  TAKE A FIBER SUPPLEMENT (FiberCon or Benefiner soluble fiber) twice a day - to thicken stools by absorbing excess fluid and retrain the intestines to act more normally.  Slowly increase the dose over a few weeks.  Too much fiber too soon can backfire and cause cramping & bloating.  TAKE AN IRON SUPPLEMENT twice a day to naturally constipate your bowels.  Usually ferrous sulfate 325mg twice a day)  TAKE ANTI-DIARRHEAL MEDICINES: Loperamide (Imodium) can slow down diarrhea.  Start with two tablets (= 4mg) first and then try one tablet every 6 hours.  Can go up to 2 pills four times day (8 pills of 2mg max) Avoid if you are having fevers or severe pain.  If you are not better or start feeling worse, stop all medicines and call your doctor for advice LoMotil (Diphenoxylate / Atropine) is another medicine that can constipate & slow down bowel moevements Pepto  Bismol (bismuth) can gently thicken bowels as well  If diarrhea is worse,: drink plenty of liquids and try simpler foods for a few days to avoid stressing your intestines further. Avoid dairy products (especially milk & ice cream) for a short time.  The intestines often can lose the ability to digest lactose when stressed. Avoid foods that cause gassiness or bloating.  Typical foods include beans and other legumes, cabbage, broccoli, and dairy foods.  Every person has some sensitivity to other foods, so listen to our body and avoid those foods that trigger problems for you.Call your doctor if you are getting worse or not better.  Sometimes further testing (cultures, endoscopy, X-ray studies, bloodwork, etc) may be needed to help diagnose and treat the cause of the diarrhea. Take extra anti-diarrheal medicines (maximum is 8 pills of 2mg loperamide a day)   Tips for POUCHING an OSTOMY   Changing Your Pouch The best time to change your pouch is in the morning, before eating or drinking anything. Your stoma can function at any time, but it will function more after eating or drinking.   Applying the pouching system  Place all your equipment close at hand before removing your pouch.  Wash your hands.  Stand or sit in front of a mirror. Use the position that works best for you. Remember that you must keep the skin around the stoma   wrinkle-free for a good seal.  Gently remove the used pouch (1-piece system) or the pouch and old wafer (2-piece system). Empty the pouch into the toilet. Save the closure clip to use again.  Wash the stoma itself and the skin around the stoma. Your stoma may bleed a little when being washed. This is normal. Rinse and pat dry. You may use a wash cloth or soft paper towels (like Bounty), mild soap (like Dial, Safeguard, or Ivory), and water. Avoid soaps that contain perfumes or lotions.  For a new pouch (1-piece system) or a new wafer (2-piece system), measure your  stoma using the stoma guide in each box of supplies.  Trace the shape of your stoma onto the back of the new pouch or the back of the new wafer. Cut out the opening. Remove the paper backing and set it aside.  Optional: Apply a skin barrier powder to surrounding skin if it is irritated (bare or weeping), and dust off the excess. Optional: Apply a skin-prep wipe (such as Skin Prep or All-Kare) to the skin around the stoma, and let it dry. Do not apply this solution if the skin is irritated (red, tender, or broken) or if you have shaved around the stoma. Optional: Apply a skin barrier paste (such as Stomahesive, Coloplast, or Premium) around the opening cut in the back of the pouch or wafer. Allow it to dry for 30 to 60 seconds.  Hold the pouch (1-piece system) or wafer (2-piece system) with the sticky side toward your body. Make sure the skin around the stoma is wrinkle-free. Center the opening on the stoma, then press firmly to your abdomen (Fig. 4). Look in the mirror to check if you are placing the pouch, or wafer, in the right position. For a 2-piece system, snap the pouch onto the wafer. Make sure it snaps into place securely.  Place your hand over the stoma and the pouch or wafer for about 30 seconds. The heat from your hand can help the pouch or wafer stick to your skin.  Add deodorant (such as Super Banish or Nullo) to your pouch. Other options include food extracts such as vanilla oil and peppermint extract. Add about 10 drops of the deodorant to the pouch. Then apply the closure clamp. Note: Do not use toxic  chemicals or commercial cleaning agents in your pouch. These substances may harm the stoma.  Optional: For extra seal, apply tape to all 4 sides around the pouch or wafer, as if you were framing a picture. You may use any brand of medical adhesive tape. Change your pouch every 5 to 7 days. Change it immediately if a leak occurs.  Wash your hands afterwards.  If you are wearing a  2-piece system, you may use 2 new pouches per week and alternate them. Rinse the pouch with mild soap and warm water and hang it to dry for the next day. Apply the fresh pouch. Alternate the 2 pouches like this for a week. After a week, change the wafer and begin with 2 new pouches. Place the old pouches in a plastic bag, and put them in the trash.   LIVING WITH AN OSTOMY  Emptying Your Pouch Empty your pouch when it is one-third full (of urine, stool, and/or gas). If you wait until your pouch is fuller than this, it will be more difficult to empty and more noticeable. When you empty your pouch, either put toilet paper in the toilet bowl first, or flush the   toilet while you empty the pouch. This will reduce splashing. You can empty the pouch between your legs or to one side while sitting, or while standing or stooping. If you have a 2-piece system, you can snap off the pouch to empty it. Remember that your stoma may function during this time. If you wish to rinse your pouch after you empty it, a turkey baster can be helpful. When using a baster, squirt water up into the pouch through the opening at the bottom. With a 2-piece system, you can snap off the pouch to rinse it. After rinsing  your pouch, empty it into the toilet. When rinsing your pouch at home, put a few granules of Dreft soap in the rinse water. This helps lubricate and freshen your pouch. The inside of your pouch can be sprayed with non-stick cooking oil (Pam spray). This may help reduce stool sticking to the inside of the pouch.  Bathing You may shower or bathe with your pouch on or off. Remember that your stoma may function during this time.  The materials you use to wash your stoma and the skin around it should be clean, but they do not need to be sterile.  Wearing Your Pouch During hot weather, or if you perspire a lot in general, wear a cover over your pouch. This may prevent a rash on your skin under the pouch. Pouch covers are  sold at ostomy supply stores. Wear the pouch inside your underwear for better support. Watch your weight. Any gain or loss of 10 to 15 pounds or more can change the way your pouch fits.  Going Away From Home A collapsible cup (like those that come in travel kits) or a soft plastic squirt bottle with a pull-up top (like a travel bottle for shampoo) can be used for rinsing your pouch when you are away from home. Tilt the opening of the pouch at an upward angle when using a cup to rinse.  Carry wet wipes or extra tissues to use in public bathrooms.  Carry an extra pouching system with you at all times.  Never keep ostomy supplies in the glove compartment of your car. Extreme heat or cold can damage the skin barriers and adhesive wafers on the pouch.  When you travel, carry your ostomy supplies with you at all times. Keep them within easy reach. Do not pack ostomy supplies in baggage that will be checked or otherwise separated from you, because your baggage might be lost. If you're traveling out of the country, it is helpful to have a letter stating that you are carrying ostomy supplies as a medical necessity.  If you need ostomy supplies while traveling, look in the yellow pages of the telephone book under "Surgical Supplies." Or call the local ostomy organization to find out where supplies are available.  Do not let your ostomy supplies get low. Always order new pouches before you use the last one.  Reducing Odor Limit foods such as broccoli, cabbage, onions, fish, and garlic in your diet to help reduce odor. Each time you empty your pouch, carefully clean the opening of the pouch, both inside and outside, with toilet paper. Rinse your pouch 1 or 2 times daily after you empty it (see directions for emptying your pouch and going away from home). Add deodorant (such as Super Banish or Nullo) to your pouch. Use air deodorizers in your bathroom. Do not add aspirin to your pouch. Even though  aspirin can help prevent odor, it   could cause ulcers on your stoma.  When to call the doctor Call the doctor if you have any of the following symptoms: Purple, black, or white stoma Severe cramps lasting more than 6 hours Severe watery discharge from the stoma lasting more than 6 hours No output from the colostomy for 3 days Excessive bleeding from your stoma Swelling of your stoma to more than 1/2-inch larger than usual Pulling inward of your stoma below skin level Severe skin irritation or deep ulcers Bulging or other changes in your abdomen  When to call your ostomy nurse Call your ostomy/enterostomal therapy (WOCN) nurse if any of the following occurs: Frequent leaking of your pouching system Change in size or appearance of your stoma, causing discomfort or problems with your pouch Skin rash or rawness Weight gain or loss that causes problems with your pouch     FREQUENTLY ASKED QUESTIONS   Why haven't you met any of these folks who have an ostomy?  Well, maybe you have! You just did not recognize them because an ostomy doesn't show. It can be kept secret if you wish. Why, maybe some of your best friends, office associates or neighbors have an ostomy ... you never can tell. People facing ostomy surgery have many quality-of-life questions like: Will you bulge? Smell? Make noises? Will you feel waste leaving your body? Will you be a captive of the toilet? Will you starve? Be a social outcast? Get/stay married? Have babies? Easily bathe, go swimming, bend over?  OK, let's look at what you can expect:   Will you bulge?  Remember, without part of the intestine or bladder, and its contents, you should have a flatter tummy than before. You can expect to wear, with little exception, what you wore before surgery ... and this in-cludes tight clothing and bathing suits.   Will you smell?  Today, thanks to modern odor proof pouching systems, you can walk into an ostomy support group  meeting and not smell anything that is foul or offensive. And, for those with an ileostomy or colostomy who are concerned about odor when emptying their pouch, there are in-pouch deodorants that can be used to eliminate any waste odors that may exist.   Will you make noises?  Everyone produces gas, especially if they are an air-swallower. But intestinal sounds that occur from time to time are no differ-ent than a gurgling tummy, and quite often your clothing will muffle any sounds.   Will you feel the waste discharges?  For those with a colostomy or ileostomy there might be a slight pressure when waste leaves your body, but understand that the intestines have no nerve endings, so there will be no unpleasant sensations. Those with a urostomy will probably be unaware of any kidney drainage.   Will you be a captive of the toilet?  Immediately post-op you will spend more time in the bathroom than you will after your body recovers from surgery. Every person is different, but on average those with an ileostomy or urostomy may empty their pouches 4 to 6 times a day; a little  less if you have a colostomy. The average wear time between pouch system changes is 3 to 5 days and the changing process should take less than 30 minutes.   Will I need to be on a special diet? Most people return to their normal diet when they have recovered from surgery. Be sure to chew your food well, eat a well-balanced diet and drink plenty of fluids. If   you experience problems with a certain food, wait a couple of weeks and try it again.  Will there be odor and noises? Pouching systems are designed to be odor-proof or odor-resistant. There are deodorants that can be used in the pouch. Medications are also available to help reduce odor. Limit gas-producing foods and carbonated beverages. You will experience less gas and fewer noises as you heal from surgery.  How much time will it take to care for my ostomy? At first, you may  spend a lot of time learning about your ostomy and how to take care of it. As you become more comfortable and skilled at changing the pouching system, it will take very little time to care for it.   Will I be able to return to work? People with ostomies can perform most jobs. As soon as you have healed from surgery, you should be able to return to work. Heavy lifting (more than 10 pounds) may be discouraged.   What about intimacy? Sexual relationships and intimacy are important and fulfilling aspects of your life. They should continue after ostomy surgery. Intimacy-related concerns should be discussed openly between you and your partner.   Can I wear regular clothing? You do not need to wear special clothing. Ostomy pouches are fairly flat and barely noticeable. Elastic undergarments will not hurt the stoma or prevent the ostomy from functioning.   Can I participate in sports? An ostomy should not limit your involvement in sports. Many people with ostomies are runners, skiers, swimmers or participate in other active lifestyles. Talk with your caregiver first before doing heavy physical activity.  Will you starve?  Not if you follow doctor's orders at each stage of your post-op adjustment. There is no such thing as an "ostomy diet". Some people with an ostomy will be able to eat and tolerate anything; others may find diffi-culty with some foods. Each person is an individual and must determine, by trial, what is best for them. A good practice for all is to drink plenty of water.   Will you be a social outcast?  Have you met anyone who has an ostomy and is a social outcast? Why should you be the first? Only your attitude and self image will effect how you are treated. No confi-dent person is an outcast.    PROFESSIONAL HELP   Resources are available if you need help or have questions about your ostomy.   Specially trained nurses called Wound, Ostomy Continence Nurses (WOCN) are available for  consultation in most major medical centers.  Consider getting an ostomy consult at an outpatient ostomy clinic.   [place] has an Ostomy Clinic run by an WOCN ostomy nurse at the [facility].  [phone]. [facility] can help set up an appointment   The [employer] Ostomy Association (UOA) is a group made up of many local chapters throughout the [place]. These local groups hold meetings and provide support to prospective and existing ostomates. They sponsor educational events and have qualified visitors to make personal or telephone visits. Contact the UOA for the chapter nearest you and for other educational publications.  More detailed information can be found in Colostomy Guide, a publication of the United Ostomy Association (UOA). Contact UOA at 1-800-826-0826 or visit their web site at www.uoaa.org. The website contains links to other sites, suppliers and resources.  [facility] Secure Start Services: Start at the website to enlist for support.  Your Wound Ostomy (WOCN) nurse may have started this   process. https://www.[employer].com/en/securestart Secure Start services are designed to support people as they live their lives with an ostomy or neurogenic bladder. Enrolling is easy and at no cost to the patient. We realize that each person's needs and life journey are different. Through Secure Start services, we want to help people live their life, their way.  #######################################################  

## 2022-05-26 NOTE — H&P (Signed)
CC: abd pain, nausea  Requesting provider: Dr Benay Spice  HPI: Anthony Calhoun is an 56 y.o. male who is here for diagnostic laparotomy.  He has a CT which shows some small bowel thickening unable to be biopsied percutaneously  Past Medical History:  Diagnosis Date   Anemia    Anxiety    Arthritis    back,    Cancer ([place])    cecum   Depression    Diabetes mellitus without complication ([place])    Difficult intubation    Dyspnea    started with anemia   GERD (gastroesophageal reflux disease)    History of kidney stones    Hypertension    Neuromuscular disorder ([place])    carpal tunnel    Past Surgical History:  Procedure Laterality Date   APPENDECTOMY     COLON SURGERY     COLONOSCOPY     CYSTOSCOPY W/ URETEROSCOPY W/ LITHOTRIPSY  1981   IR RADIOLOGIST EVAL & MGMT  01/17/2022   IR RADIOLOGIST EVAL & MGMT  03/07/2022   RADIOLOGY WITH ANESTHESIA N/A 02/08/2022   Procedure: CT MICROWAVE ABLATION;  Surgeon: Criselda Peaches, MD;  Location: [facility];  Service: Radiology;  Laterality: N/A;    History reviewed. No pertinent family history.  Social:  reports that he has never smoked. He has never used smokeless tobacco. He reports that he does not drink alcohol and does not use drugs.  Allergies:  Allergies  Allergen Reactions   Chlorthalidone Swelling    Swelling of lips   Cyclobenzaprine Other (See Comments)    Unknown reaction   Gabapentin Nausea Only   Losartan Potassium-Hctz Other (See Comments)   Prednisone Anxiety    "panic attack" Panic attacks    Medications: I have reviewed the patient's current medications.  Results for orders placed or performed during the hospital encounter of 05/26/22 (from the past 48 hour(s))  CBC with Differential/Platelet     Status: Abnormal   Collection Time: 05/26/22  9:00 AM  Result Value Ref Range   WBC 6.5 4.0 - 10.5 K/uL   RBC 4.48 4.22 - 5.81 MIL/uL   Hemoglobin 9.8 (L) 13.0 - 17.0 g/dL   HCT 33.3 (L) 39.0 - 52.0  %   MCV 74.3 (L) 80.0 - 100.0 fL   MCH 21.9 (L) 26.0 - 34.0 pg   MCHC 29.4 (L) 30.0 - 36.0 g/dL   RDW 15.9 (H) 11.5 - 15.5 %   Platelets 358 150 - 400 K/uL   nRBC 0.0 0.0 - 0.2 %   Neutrophils Relative % 63 %   Neutro Abs 4.2 1.7 - 7.7 K/uL   Lymphocytes Relative 19 %   Lymphs Abs 1.2 0.7 - 4.0 K/uL   Monocytes Relative 10 %   Monocytes Absolute 0.7 0.1 - 1.0 K/uL   Eosinophils Relative 6 %   Eosinophils Absolute 0.4 0.0 - 0.5 K/uL   Basophils Relative 1 %   Basophils Absolute 0.1 0.0 - 0.1 K/uL   Immature Granulocytes 1 %   Abs Immature Granulocytes 0.03 0.00 - 0.07 K/uL    Comment: Performed at [facility], [place] [address], [place], [place] [address]  Glucose, capillary     Status: Abnormal   Collection Time: 05/26/22  9:24 AM  Result Value Ref Range   Glucose-Capillary 143 (H) 70 - 99 mg/dL    Comment: Glucose reference range applies only to samples taken after fasting for at least 8 hours.  No results found.  ROS - all of the below systems have been reviewed with the patient and positives are indicated with bold text General: chills, fever or night sweats Eyes: blurry vision or double vision ENT: epistaxis or sore throat Hematologic/Lymphatic: bleeding problems, blood clots or swollen lymph nodes Endocrine: temperature intolerance or unexpected weight changes Breast: new or changing breast lumps or nipple discharge Resp: cough, shortness of breath, or wheezing CV: chest pain or dyspnea on exertion GI: as per HPI GU: dysuria, trouble voiding, or hematuria Neuro: TIA or stroke symptoms    PE Blood pressure (!) 141/92, pulse 98, temperature 98.8 F (37.1 C), temperature source Oral, height '5\' 6"'$  (1.676 m), weight 112 kg, SpO2 98 %. Constitutional: NAD; conversant; no deformities Eyes: Moist conjunctiva; no lid lag; anicteric; PERRL Neck: Trachea midline; no thyromegaly Lungs: Normal respiratory effort CV: RRR GI: Abd soft MSK: Normal range of  motion of extremities; no clubbing/cyanosis Psychiatric: Appropriate affect; alert and oriented x3  Results for orders placed or performed during the hospital encounter of 05/26/22 (from the past 48 hour(s))  CBC with Differential/Platelet     Status: Abnormal   Collection Time: 05/26/22  9:00 AM  Result Value Ref Range   WBC 6.5 4.0 - 10.5 K/uL   RBC 4.48 4.22 - 5.81 MIL/uL   Hemoglobin 9.8 (L) 13.0 - 17.0 g/dL   HCT 33.3 (L) 39.0 - 52.0 %   MCV 74.3 (L) 80.0 - 100.0 fL   MCH 21.9 (L) 26.0 - 34.0 pg   MCHC 29.4 (L) 30.0 - 36.0 g/dL   RDW 15.9 (H) 11.5 - 15.5 %   Platelets 358 150 - 400 K/uL   nRBC 0.0 0.0 - 0.2 %   Neutrophils Relative % 63 %   Neutro Abs 4.2 1.7 - 7.7 K/uL   Lymphocytes Relative 19 %   Lymphs Abs 1.2 0.7 - 4.0 K/uL   Monocytes Relative 10 %   Monocytes Absolute 0.7 0.1 - 1.0 K/uL   Eosinophils Relative 6 %   Eosinophils Absolute 0.4 0.0 - 0.5 K/uL   Basophils Relative 1 %   Basophils Absolute 0.1 0.0 - 0.1 K/uL   Immature Granulocytes 1 %   Abs Immature Granulocytes 0.03 0.00 - 0.07 K/uL    Comment: Performed at [facility], [place] [address] [place], [place] [address]  Glucose, capillary     Status: Abnormal   Collection Time: 05/26/22  9:24 AM  Result Value Ref Range   Glucose-Capillary 143 (H) 70 - 99 mg/dL    Comment: Glucose reference range applies only to samples taken after fasting for at least 8 hours.    No results found.   A/P: Anthony Calhoun is an 56 y.o. male with abd pain and thickened small bowel on CT scan.  I have recommended diagnostic laparoscopy.  Possible biopsy or bowel resection.  Risks include bleeding, infection, damage to adjacent structures, recurrence or non resolution of disease process and negative findings.      Rosario Adie, MD  [facility]

## 2022-05-26 NOTE — Transfer of Care (Signed)
Immediate Anesthesia Transfer of Care Note  Patient: Anthony Calhoun  Procedure(s) Performed: LAPAROSCOPY DIAGNOSTIC SMALL BOWEL RESECTION (Abdomen)  Patient Location: PACU  Anesthesia Type:General  Level of Consciousness: awake and oriented  Airway & Oxygen Therapy: Patient Spontanous Breathing and Patient connected to face mask oxygen  Post-op Assessment: Report given to RN and Post -op Vital signs reviewed and stable  Post vital signs: Reviewed and stable  Last Vitals:  Vitals Value Taken Time  BP 135/83 05/26/22 1215  Temp    Pulse 89 05/26/22 1215  Resp 11 05/26/22 1215  SpO2 94 % 05/26/22 1215  Vitals shown include unvalidated device data.  Last Pain:  Vitals:   05/26/22 0909  TempSrc:   PainSc: 0-No pain         Complications: No notable events documented.

## 2022-05-26 NOTE — Anesthesia Procedure Notes (Signed)
Procedure Name: Intubation Date/Time: 05/26/2022 10:34 AM  Performed by: Sharlette Dense, CRNAPre-anesthesia Checklist: Patient identified, Emergency Drugs available, Suction available and Patient being monitored Patient Re-evaluated:Patient Re-evaluated prior to induction Oxygen Delivery Method: Circle system utilized Preoxygenation: Pre-oxygenation with 100% oxygen Induction Type: IV induction Ventilation: Mask ventilation without difficulty and Oral airway inserted - appropriate to patient size Laryngoscope Size: Miller and 3 Grade View: Grade I Tube type: Oral Tube size: 8.0 mm Number of attempts: 1 Airway Equipment and Method: Stylet and Oral airway Placement Confirmation: ETT inserted through vocal cords under direct vision, positive ETCO2 and breath sounds checked- equal and bilateral Secured at: 22 cm Tube secured with: Tape Dental Injury: Teeth and Oropharynx as per pre-operative assessment

## 2022-05-26 NOTE — Anesthesia Preprocedure Evaluation (Signed)
Anesthesia Evaluation  Patient identified by MRN, date of birth, ID band Patient awake    Reviewed: Allergy & Precautions, NPO status , Patient's Chart, lab work & pertinent test results  Airway Mallampati: III  TM Distance: >3 FB Neck ROM: Full  Mouth opening: Limited Mouth Opening  Dental no notable dental hx. (+) Teeth Intact, Dental Advisory Given   Pulmonary sleep apnea ,    Pulmonary exam normal breath sounds clear to auscultation       Cardiovascular hypertension, Pt. on medications Normal cardiovascular exam Rhythm:Regular Rate:Normal     Neuro/Psych Anxiety Depression    GI/Hepatic GERD  ,Metastatic Colon CA   Endo/Other  diabetes  Renal/GU Lab Results      Component                Value               Date                      CREATININE               0.84                01/30/2022                K                        3.5                 01/30/2022                     Musculoskeletal  (+) Arthritis , Osteoarthritis,    Abdominal (+) + obese,   Peds  Hematology  (+) Blood dyscrasia, anemia , Lab Results      Component                Value               Date                        HGB                      8.8 (L)             02/07/2022                HCT                      31.4 (L)            02/07/2022                  PLT                      348                 02/07/2022              Anesthesia Other Findings ALL: chlorthalidone, Cylobenzaprine, gabapentin, losartan,  prednisone  Reproductive/Obstetrics                             Anesthesia Physical  Anesthesia Plan  ASA: 3  Anesthesia Plan: General   Post-op Pain Management: Tylenol PO (pre-op)* and Dilaudid IV   Induction: Intravenous  PONV  Risk Score and Plan: 2 and Treatment may vary due to age or medical condition, Midazolam and Ondansetron  Airway Management Planned: Oral ETT  Additional Equipment:  None  Intra-op Plan:   Post-operative Plan: Extubation in OR  Informed Consent: I have reviewed the patients History and Physical, chart, labs and discussed the procedure including the risks, benefits and alternatives for the proposed anesthesia with the patient or authorized representative who has indicated his/her understanding and acceptance.     Dental advisory given  Plan Discussed with: CRNA  Anesthesia Plan Comments:         Anesthesia Quick Evaluation

## 2022-05-26 NOTE — Progress Notes (Signed)
Pharmacy Brief Note - Alvimopan (Entereg)  Alvimopan (Entereg) Restrictions per [facility] P[facility]T and [facility]: Patients must have partial large or small bowel resection with primary anastomosis. Patients must be capable of taking alvimopan PO; not administered via NG tube. Patients must receive preoperative alvimopan to receive postoperative doses. Alvimopan is not continued after discharge. Alvimopan must be ordered by a credentialed prescriber Alvimopan will automatically be discontinued after patient has a bowel movement.   No preop dose was ordered, therefore, alvimopan has been discontinued.  If there are questions, please contact the pharmacy at [phone].  Thank you  Gretta Arab PharmD, BCPS 07/28/2016 10:49 AM

## 2022-05-26 NOTE — Op Note (Signed)
05/26/2022  11:41 AM  PATIENT:  Anthony Calhoun  56 y.o. male  Patient Care Team: Antony Contras, MD as PCP - General (Family Medicine) Jonnie Finner, RN (Inactive) as Oncology Nurse Navigator Ladell Pier, MD as Consulting Physician (Oncology)  PRE-OPERATIVE DIAGNOSIS:  SMALL BOWEL MASS, ELEVATED CEA, H/O COLON CANCER  POST-OPERATIVE DIAGNOSIS:  SMALL BOWEL MALIGNANT APPEARING MASS  PROCEDURE:  LAPAROSCOPY DIAGNOSTIC SMALL BOWEL RESECTION   Surgeon(s): Leighton Ruff, MD  ASSISTANT: Toula Moos, MD   ANESTHESIA:   local and general  EBL:  Total I/O In: 1000 [I.V.:1000] Out: 25 [Blood:25]  DRAINS: none   SPECIMEN:  Source of Specimen:  small bowel  DISPOSITION OF SPECIMEN:  PATHOLOGY  COUNTS:  YES  PLAN OF CARE: Admit for overnight observation  PATIENT DISPOSITION:  PACU - hemodynamically stable.  INDICATION: 56 y.o. M with h/o colon cancer and small bowel mass   OR FINDINGS: Malignant appearing small bowel mass with partial obstruction  DESCRIPTION: the patient was identified in the preoperative holding area and taken to the OR where they were laid supine on the operating room table.  General anesthesia was induced without difficulty. SCDs were also noted to be in place prior to the initiation of anesthesia.  The patient was then prepped and draped in the usual sterile fashion.   A surgical timeout was performed indicating the correct patient, procedure, positioning and need for preoperative antibiotics.   We began by using a varies needle in the left upper quadrant to obtain access to the abdomen.  The abdomen was insufflated to approximately 15 mmHg.  We then inserted a 5 mm laparoscopic port.  Camera inspection revealed no injury at entrance site.  2 more 5 mm ports were placed in the mid and lower left abdomen.  There were minimal adhesions from his previous surgery.  We identified the anastomosis, which appeared normal.  We carried this proximally  running the small bowel as we went.  A mass was identified in the mid small bowel.  It appeared malignant.  I decided to perform a small bowel resection.  A small upper midline incision was made and carried down through the subcutaneous tissues.  The fascia was entered using electrocautery and the peritoneum was then entered bluntly.  An [place] wound protector was placed.  The small bowel was removed from the wound.  This was transected on either side of the mass using GIA blue load 75 mm staplers.  The mesentery was divided using a vessel sealer device.  This was then sent to pathology for further examination.  An anastomosis between the 2 loops of small bowel was created using a 75 mm GIA stapler.  The common enterotomy was closed with a TA 60 mm stapler.  The mesenteric defect was closed using a running 2-0 silk suture.  In and tied tension 3-0 silk suture was also placed in the crotch of the anastomosis.  The anastomosis was patent to approximately 2 fingerbreadths.  This was then placed back into the abdomen.  The cap was placed on the wound protector and the abdomen was reinsufflated.  The remaining small bowel was ran to the ligament of Treitz.  No other masses were noted.  Hemostasis was good.  A tap block was placed under laparoscopic visualization bilaterally.  The abdomen was then desufflated and the ports were removed.  The small extraction incision was closed using interrupted #1 Novafil sutures for the fascia.  A 2-0 Vicryl suture was used to close  the subcutaneous cavity and a 4-0 Vicryl suture was used to close the skin.  Port sites were also closed with 4-0 Vicryl sutures and Dermabond.  A dressing was placed over the midline wound.  The patient was then awakened from anesthesia and sent to the postanesthesia care unit in stable condition.  All counts were correct per operating room staff.  I was personally present during the key and critical portions of this procedure and immediately available  throughout the entire procedure, as documented in my operative note.   Rosario Adie, MD  [facility]

## 2022-05-27 ENCOUNTER — Encounter (HOSPITAL_COMMUNITY): Payer: Self-pay | Admitting: General Surgery

## 2022-05-27 LAB — CBC
HCT: 33.8 % — ABNORMAL LOW (ref 39.0–52.0)
Hemoglobin: 9.6 g/dL — ABNORMAL LOW (ref 13.0–17.0)
MCH: 21.6 pg — ABNORMAL LOW (ref 26.0–34.0)
MCHC: 28.4 g/dL — ABNORMAL LOW (ref 30.0–36.0)
MCV: 76.1 fL — ABNORMAL LOW (ref 80.0–100.0)
Platelets: 377 10*3/uL (ref 150–400)
RBC: 4.44 MIL/uL (ref 4.22–5.81)
RDW: 15.9 % — ABNORMAL HIGH (ref 11.5–15.5)
WBC: 8.4 10*3/uL (ref 4.0–10.5)
nRBC: 0 % (ref 0.0–0.2)

## 2022-05-27 LAB — BASIC METABOLIC PANEL
Anion gap: 7 (ref 5–15)
BUN: 8 mg/dL (ref 6–20)
CO2: 27 mmol/L (ref 22–32)
Calcium: 8.4 mg/dL — ABNORMAL LOW (ref 8.9–10.3)
Chloride: 104 mmol/L (ref 98–111)
Creatinine, Ser: 0.67 mg/dL (ref 0.61–1.24)
GFR, Estimated: >60 mL/min (ref >60)
Glucose, Bld: 172 mg/dL — ABNORMAL HIGH (ref 70–99)
Potassium: 4.1 mmol/L (ref 3.5–5.1)
Sodium: 138 mmol/L (ref 135–145)

## 2022-05-27 MED ORDER — FUROSEMIDE 20 MG PO TABS
10.0000 mg | ORAL_TABLET | Freq: Once | ORAL | Status: AC
  Administered 2022-05-27: 10 mg via ORAL
  Filled 2022-05-27: qty 1

## 2022-05-27 NOTE — Progress Notes (Signed)
1 Day Post-Op   Subjective/Chief Complaint: Pain well controlled.  Still on oxygen.  Having bowel function.  Flatus only.   Objective: Vital signs in last 24 hours: Temp:  [97.6 F (36.4 C)-98.6 F (37 C)] 98.3 F (36.8 C) (09/30 0900) Pulse Rate:  [79-92] 86 (09/30 0900) Resp:  [11-17] 16 (09/30 0900) BP: (118-148)/(68-87) 138/83 (09/30 0900) SpO2:  [91 %-95 %] 92 % (09/30 0512) Weight:  [076 kg] 112 kg (09/30 0500) Last BM Date : 05/25/22  Intake/Output from previous day: 09/29 0701 - 09/30 0700 In: 3117.1 [P.O.:840; I.V.:2104; IV Piggyback:173.1] Out: 1725 [Urine:1700; Blood:25] Intake/Output this shift: No intake/output data recorded.  General appearance: alert and cooperative Resp: clear to auscultation bilaterally Cardio: Normal sinus rhythm Incision/Wound: Incision clean dry intact.  Honeycomb dressing in place with minimal drainage.  Tender but overall soft.  Lab Results:  Recent Labs    05/26/22 0900 05/27/22 0527  WBC 6.5 8.4  HGB 9.8* 9.6*  HCT 33.3* 33.8*  PLT 358 377   BMET Recent Labs    05/26/22 0900 05/27/22 0527  NA 136 138  K 3.8 4.1  CL 105 104  CO2 24 27  GLUCOSE 150* 172*  BUN 7 8  CREATININE 0.68 0.67  CALCIUM 8.4* 8.4*   PT/INR No results for input(s): "LABPROT", "INR" in the last 72 hours. ABG No results for input(s): "PHART", "HCO3" in the last 72 hours.  Invalid input(s): "PCO2", "PO2"  Studies/Results: No results found.  Anti-infectives: Anti-infectives (From admission, onward)    Start     Dose/Rate Route Frequency Ordered Stop   05/26/22 1600  cefoTEtan (CEFOTAN) 2 g in sodium chloride 0.9 % 100 mL IVPB        2 g 200 mL/hr over 30 Minutes Intravenous Every 12 hours 05/26/22 1333 05/26/22 1538   05/26/22 0830  ceFAZolin (ANCEF) IVPB 2g/100 mL premix        2 g 200 mL/hr over 30 Minutes Intravenous On call to O.R. 05/26/22 0826 05/26/22 1051       Assessment/Plan: s/p Procedure(s): LAPAROSCOPY DIAGNOSTIC  (N/A) SMALL BOWEL RESECTION (N/A)   Advance diet  Ambulate  1 dose of Lasix to see if we can wean his oxygen.  He seems significantly more in than out  DVT prophylaxis    LOS: 1 day    Joyice Faster Mersadez Linden 05/27/2022

## 2022-05-27 NOTE — Plan of Care (Signed)

## 2022-05-28 MED ORDER — OXYCODONE HCL 5 MG PO TABS: 5.0000 mg | ORAL_TABLET | Freq: Four times a day (QID) | ORAL | 0 refills | Status: DC | PRN

## 2022-05-28 MED ORDER — POLYETHYLENE GLYCOL 3350 17 GM/SCOOP PO POWD: 17.0000 g | Freq: Every day | ORAL | 1 refills | Status: AC

## 2022-05-28 MED ORDER — METHOCARBAMOL 750 MG PO TABS: 750.0000 mg | ORAL_TABLET | Freq: Three times a day (TID) | ORAL | 0 refills | Status: DC | PRN

## 2022-05-28 NOTE — Discharge Summary (Signed)
Physician Discharge Summary  Patient ID: Anthony Calhoun MRN: [id number] DOB/AGE: 01/18/1966 56 y.o.  Admit date: 05/26/2022 Discharge date: 05/28/2022  Admission Diagnoses: Small bowel mass  Discharge Diagnoses:  Principal Problem:   Small bowel mass   Discharged Condition: good  Hospital Course: Patient went laparoscopic small bowel resection.  He did well.  His diet was advanced to the next 2 days.  He had a bowel movement.  He is tolerating his diet.  He had no nausea or vomiting.  He had good pain control.  He was weaned off oxygen.  He was discharged home in good condition on postop day 2.      Treatments: surgery: Laparoscopic assisted small bowel resection  Discharge Exam: Blood pressure 132/80, pulse 90, temperature 98 F (36.7 C), temperature source Oral, resp. rate 16, height '5\' 6"'$  (1.676 m), weight 112.1 kg, SpO2 98 %. General appearance: alert and cooperative Resp: clear to auscultation bilaterally Cardio: Normal sinus rhythm Incision/Wound: Incision clean dry intact.  Soft nontender without rebound or guarding.  Disposition: Discharge disposition: 01-Home or Self Care       Discharge Instructions     Diet - low sodium heart healthy   Complete by: As directed    Increase activity slowly   Complete by: As directed       Allergies as of 05/28/2022       Reactions   Chlorthalidone Swelling   Swelling of lips   Cyclobenzaprine Other (See Comments)   Unknown reaction   Gabapentin Nausea Only   Losartan Potassium-hctz Other (See Comments)   Prednisone Anxiety   "panic attack" Panic attacks        Medication List     STOP taking these medications    metoCLOPramide 10 MG tablet Commonly known as: REGLAN       TAKE these medications    ALPRAZolam 1 MG tablet Commonly known as: XANAX Take one tablet three times daily as needed for anxiety.   amLODipine 5 MG tablet Commonly known as: NORVASC Take 5 mg by mouth daily.    atorvastatin 40 MG tablet Commonly known as: LIPITOR Take 40 mg by mouth daily.   carvedilol 6.25 MG tablet Commonly known as: COREG Take 6.25 mg by mouth 2 (two) times daily with a meal.   chlorpheniramine-HYDROcodone 10-8 MG/5ML Commonly known as: TUSSIONEX Take 5 mLs by mouth every 12 (twelve) hours as needed for cough.   docusate sodium 100 MG capsule Commonly known as: COLACE Take 1 capsule (100 mg total) by mouth 2 (two) times daily.   magic mouthwash Soln Take 5 mLs by mouth 4 (four) times daily as needed for mouth pain (swish for 1 minute and then spit for mouth pain).   methocarbamol 750 MG tablet Commonly known as: Robaxin-750 Take 1 tablet (750 mg total) by mouth every 8 (eight) hours as needed for muscle spasms.   omeprazole 20 MG capsule Commonly known as: PRILOSEC Take 20 mg by mouth 2 (two) times daily before a meal.   ondansetron 8 MG tablet Commonly known as: ZOFRAN Take 1 tablet (8 mg total) by mouth every 8 (eight) hours as needed for nausea.   oxyCODONE 5 MG immediate release tablet Commonly known as: Oxy IR/ROXICODONE Take 1 tablet (5 mg total) by mouth every 6 (six) hours as needed for severe pain.   polyethylene glycol powder 17 GM/SCOOP powder Commonly known as: GlycoLax Take 17 g by mouth daily.   promethazine 25 MG tablet Commonly known as: PHENERGAN  Take 25 mg by mouth every 6 (six) hours as needed for nausea or vomiting.   sertraline 100 MG tablet Commonly known as: ZOLOFT TAKE 2 TABLETS BY MOUTH EVERY DAY What changed:  how much to take how to take this when to take this additional instructions   triamcinolone 0.1 % paste Commonly known as: KENALOG Use as directed 1 application  in the mouth or throat 2 (two) times daily as needed (oral irritation).        Follow-up [facility], [place]. Schedule an appointment as soon as possible for a visit in 2 week(s).   Specialty: General Surgery Why: for wound  check Contact information: [address] [place] [phone]                Signed: Turner Daniels MD 05/28/2022, 10:06 AM

## 2022-05-28 NOTE — Progress Notes (Signed)
  Transition of Care (TOC) Screening Note   Patient Details  Name: Antoine Vandermeulen Cowdery Date of Birth: 01-Jun-1966   [facility] CM/SW Contact:    Roseanne Kaufman, RN Phone Number: 05/28/2022, 11:02 AM    Transition of Care Department [facility]) has reviewed patient and no TOC needs have been identified at this time. We will continue to monitor patient advancement through interdisciplinary progression rounds. If new patient transition needs arise, please place a TOC consult.

## 2022-05-28 NOTE — Progress Notes (Signed)
Reviewed written d/c instructions w pt and his wife and all questions answered, they verbalized understanding. D/C via w/c w all belongings in stable condition.

## 2022-05-29 LAB — SURGICAL PATHOLOGY

## 2022-06-02 ENCOUNTER — Telehealth: Payer: Self-pay

## 2022-06-02 ENCOUNTER — Inpatient Hospital Stay: Payer: Commercial Managed Care - HMO | Admitting: Oncology

## 2022-06-02 ENCOUNTER — Inpatient Hospital Stay: Payer: Commercial Managed Care - HMO | Attending: Oncology

## 2022-06-02 VITALS — BP 123/71 | HR 97 | Temp 98.1°F | Resp 18 | Ht 66.0 in | Wt 258.0 lb

## 2022-06-02 DIAGNOSIS — C787 Secondary malignant neoplasm of liver and intrahepatic bile duct: Secondary | ICD-10-CM | POA: Diagnosis not present

## 2022-06-02 DIAGNOSIS — C182 Malignant neoplasm of ascending colon: Secondary | ICD-10-CM | POA: Diagnosis not present

## 2022-06-02 DIAGNOSIS — D509 Iron deficiency anemia, unspecified: Secondary | ICD-10-CM

## 2022-06-02 LAB — CBC WITH DIFFERENTIAL (CANCER CENTER ONLY)
Abs Immature Granulocytes: 0.05 10*3/uL (ref 0.00–0.07)
Basophils Absolute: 0.1 10*3/uL (ref 0.0–0.1)
Basophils Relative: 1 %
Eosinophils Absolute: 0.4 10*3/uL (ref 0.0–0.5)
Eosinophils Relative: 6 %
HCT: 31.8 % — ABNORMAL LOW (ref 39.0–52.0)
Hemoglobin: 9.4 g/dL — ABNORMAL LOW (ref 13.0–17.0)
Immature Granulocytes: 1 %
Lymphocytes Relative: 17 %
Lymphs Abs: 1.2 10*3/uL (ref 0.7–4.0)
MCH: 21.2 pg — ABNORMAL LOW (ref 26.0–34.0)
MCHC: 29.6 g/dL — ABNORMAL LOW (ref 30.0–36.0)
MCV: 71.8 fL — ABNORMAL LOW (ref 80.0–100.0)
Monocytes Absolute: 0.7 10*3/uL (ref 0.1–1.0)
Monocytes Relative: 9 %
Neutro Abs: 4.7 10*3/uL (ref 1.7–7.7)
Neutrophils Relative %: 66 %
Platelet Count: 338 10*3/uL (ref 150–400)
RBC: 4.43 MIL/uL (ref 4.22–5.81)
RDW: 15.8 % — ABNORMAL HIGH (ref 11.5–15.5)
WBC Count: 7 10*3/uL (ref 4.0–10.5)
nRBC: 0 % (ref 0.0–0.2)

## 2022-06-02 LAB — FERRITIN: Ferritin: 17 ng/mL — ABNORMAL LOW (ref 24–336)

## 2022-06-02 MED ORDER — HYDROCOD POLI-CHLORPHE POLI ER 10-8 MG/5ML PO SUER: 5.0000 mL | Freq: Two times a day (BID) | ORAL | 0 refills | Status: DC | PRN

## 2022-06-02 MED ORDER — PANTOPRAZOLE SODIUM 40 MG PO TBEC: 40.0000 mg | DELAYED_RELEASE_TABLET | Freq: Every day | ORAL | 1 refills | Status: DC

## 2022-06-02 MED ORDER — TRIAMCINOLONE ACETONIDE 0.1 % MT PSTE: 1.0000 | PASTE | Freq: Two times a day (BID) | OROMUCOSAL | 1 refills | Status: DC | PRN

## 2022-06-02 MED ORDER — MAGIC MOUTHWASH: 5.0000 mL | Freq: Four times a day (QID) | ORAL | 1 refills | Status: DC | PRN

## 2022-06-02 NOTE — Progress Notes (Signed)
[facility] OFFICE PROGRESS NOTE   Diagnosis: Colon cancer  INTERVAL HISTORY:   Mr. Caldera when a diagnostic laparoscopy and small bowel resection by Dr. Marcello Moores on 05/26/2022.  A mass was identified in the mid small bowel.  No other masses.  He was discharged home 05/28/2022.  He is having bowel movements.  He is eating.  He reports partial improvement in abdominal pain and nausea following surgery.  He continues to have burning of the tongue.  He has a cough for the past several months.  The cough is worse after eating or drinking  He has there is no dyspnea.      Objective:  Vital signs in last 24 hours:  Blood pressure 123/71, pulse 97, temperature 98.1 F (36.7 C), temperature source Oral, resp. rate 18, height 5' 6" (1.676 m), weight 258 lb (117 kg), SpO2 100 %.    HEENT: No thrush or ulcers. Resp: Lungs clear bilaterally, no respiratory distress Cardio: Regular rate and rhythm GI: Midline incision with a foam dressing, trocar sites with glue.  No evidence of wound infection.  The abdomen is soft. Vascular: No leg edema or erythema     Lab Results:  Lab Results  Component Value Date   WBC 7.0 06/02/2022   HGB 9.4 (L) 06/02/2022   HCT 31.8 (L) 06/02/2022   MCV 71.8 (L) 06/02/2022   PLT 338 06/02/2022   NEUTROABS 4.7 06/02/2022    CMP  Lab Results  Component Value Date   NA 138 05/27/2022   K 4.1 05/27/2022   CL 104 05/27/2022   CO2 27 05/27/2022   GLUCOSE 172 (H) 05/27/2022   BUN 8 05/27/2022   CREATININE 0.67 05/27/2022   CALCIUM 8.4 (L) 05/27/2022   PROT 7.5 05/26/2022   ALBUMIN 3.0 (L) 05/26/2022   AST 29 05/26/2022   ALT 21 05/26/2022   ALKPHOS 192 (H) 05/26/2022   BILITOT 0.9 05/26/2022   GFRNONAA >60 05/27/2022   GFRAA >60 05/10/2020    Lab Results  Component Value Date   CEA1 1.08 03/07/2021   CEA 19.56 (H) 04/27/2022   Medications: I have reviewed the patient's current medications.   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 secondary to nausea (patient discontinued Xeloda on day 11 due to colonoscopy) Colonoscopy 07/09/2020-nodular mucosa at the colonic anastomosis, biopsied; patent end-to-side ileocolonic anastomosis characterized by healthy-appearing mucosa and visible sutures; nonbleeding internal hemorrhoids, biopsy with benign ulcerated anastomotic mucosa with granulation tissue Cycle 4 adjuvant Xeloda 07/19/2020, 2000 mg every morning and 1500 mg every afternoon Cycle 5 adjuvant Xeloda 08/09/2020 Cycle 6 adjuvant Xeloda 08/31/2019 Cycle 7 adjuvant Xeloda 09/20/2020 Cycle 8 adjuvant Xeloda 10/11/2020 CTs 03/07/2021-no evidence of recurrent disease, hepatic steatosis, slight wall thickening at the junction of the descending and horizontal duodenum Mild elevation of CEA 2023 12/01/2021-Guardant Reveal-ct DNA detected PET 12/30/2021-hypermetabolic right liver lesion, hypermetabolic area in a loop of mid small bowel with an SUV of 12.5, review of PET images at GI tumor conference 01/11/2022-small bowel uptake felt to be a benign finding MRI liver 01/10/2022-solitary 1.2 cm right liver lesion  between segments 7 and 8 compatible with metastasis, subtle changes suggesting cirrhosis, hepatic steatosis CT enteroscopy 02/02/2022-small bowel loop with hypermetabolic activity on PET continues to have a bandlike density along the margin felt to represent a diverticulum or adjacent nodal tissue.  Small bowel tumor not excluded.  3-4 mm left  omental nodule 02/08/2022-biopsy and ablation of solitary liver lesion, adenocarcinoma consistent with metastatic colorectal adenocarcinoma CTs 03/31/2022-unchanged circumstantial soft tissue thickening surrounding a loop of mid to distal small bowel with an adjacent nodular focus measuring 1.2 cm.,  Ablation cavity in segment 7, no other evidence of metastatic disease Diagnostic laparoscopy 05/26/2022-mid small bowel mass-resected, well to moderately differentiated adenocarcinoma, 0/2 lymph nodes, morphology consistent with a colon primary, tumor involved full-thickness including serosa with perineural and large vessel involvement Microcytic anemia secondary to #1-improved Colon polyps-sessile serrated adenoma of the descending colon, tubular adenomas with focal high-grade dysplasia of the transverse colon on colonoscopy 02/12/2020, tubular adenoma of the mid ascending colon on the surgical specimen 04/12/2020 Family history of colon cancer Depression Anxiety/panic attacks Hypertension Hyperlipidemia Nausea secondary to Xeloda?-Resolved Gastroesophageal reflux disease-improved following discontinuation of Xeloda Anemia, microcytic; ferritin 6 01/24/2022; stool positive for blood x3 01/25/2022; 02/02/2022 CT abdomen/pelvis enterography-bowel loop demonstrating hypermetabolic activity continues to have a bandlike density along its margin possibly representing a diverticulum or adjacent nodal tissue, difficult to exclude small bowel tumor, 3 x 4 mm left omental nodule or lymph node, known right hepatic lobe tumor is occult on CT. Venofer weekly x3 beginning 03/03/2022 Negative capsule endoscopy 03/09/2022 Venofer 03/03/2022 for 3 weekly doses Venofer 05/03/2022, 05/19/2022 12.  Anterior left neck nodule 03/17/2022-cyst?,  Lymph node?      Disposition: Mr Bohnsack has metastatic colon cancer.  He underwent resection of a small bowel mass last week.  The small bowel mass was likely responsible for iron deficiency  anemia, abdominal pain, nausea.  Hopefully his symptoms will continue to improve.  He has a chronic cough.  I will low clinical suspicion for metastatic disease involving the lungs, pulmonary embolism, and infection.  The cough may be related to reflux.  He will begin a trial of Protonix.  I refilled his prescription for Tussionex.  The plan is observation.  He will return for an office visit, CBC/ferritin, and CEA in 3 weeks.  He will follow-up with the surgical office for management of the midline wound.  Gary Sherrill, MD  06/02/2022  2:42 PM   

## 2022-06-02 NOTE — Telephone Encounter (Signed)
-----   Message from Ladell Pier, MD sent at 06/02/2022  4:16 PM EDT ----- Please call patient, ferritin is low, schedule IV iron next week if he is unable to tolerate oral iron

## 2022-06-02 NOTE — Addendum Note (Signed)
Addended by: Tania Ade on: 06/02/2022 03:16 PM   Modules accepted: Orders

## 2022-06-02 NOTE — Telephone Encounter (Signed)
Patient gave verbal understanding had no further questions or concerns. Schedule message was sent to be schedule.

## 2022-06-05 ENCOUNTER — Encounter: Payer: Self-pay | Admitting: Oncology

## 2022-06-09 ENCOUNTER — Inpatient Hospital Stay: Payer: Commercial Managed Care - HMO

## 2022-06-09 ENCOUNTER — Encounter: Payer: Self-pay | Admitting: *Deleted

## 2022-06-09 VITALS — BP 101/66 | HR 75 | Temp 97.3°F | Resp 18 | Ht 66.0 in | Wt 246.4 lb

## 2022-06-09 DIAGNOSIS — D509 Iron deficiency anemia, unspecified: Secondary | ICD-10-CM | POA: Diagnosis not present

## 2022-06-09 MED ORDER — SODIUM CHLORIDE 0.9 % IV SOLN
300.0000 mg | Freq: Once | INTRAVENOUS | Status: AC
  Administered 2022-06-09: 300 mg via INTRAVENOUS
  Filled 2022-06-09: qty 15

## 2022-06-09 MED ORDER — SODIUM CHLORIDE 0.9 % IV SOLN: Freq: Once | INTRAVENOUS | Status: AC

## 2022-06-09 NOTE — Patient Instructions (Signed)

## 2022-06-09 NOTE — Progress Notes (Signed)
Venofer dose today. Confirmed w/MD that he will need another dose 1 week later. Scheduling message sent.

## 2022-06-12 ENCOUNTER — Telehealth: Payer: Self-pay | Admitting: Adult Health

## 2022-06-12 ENCOUNTER — Other Ambulatory Visit: Payer: Self-pay | Admitting: Interventional Radiology

## 2022-06-12 DIAGNOSIS — C787 Secondary malignant neoplasm of liver and intrahepatic bile duct: Secondary | ICD-10-CM

## 2022-06-12 NOTE — Telephone Encounter (Signed)
Yes 200 mg Zoloft.

## 2022-06-12 NOTE — Telephone Encounter (Signed)
Pt LVM @ 1:07p.  He would like someone to call him back about how to come off a medicine Barnett Applebaum has him on.  No upcoming appt scheduled.

## 2022-06-12 NOTE — Telephone Encounter (Signed)
Patient said about the time he started sertraline he started having tongue burning and loss of taste.  He is having to coat his tongue with dental paste. He said between his oncologist and PCP they think that it could be the sertraline that is causing this. He would like to stop it, but needs something else as he is a "nervous wreck" and has depression. He said he had been on Celexa in the past. I show he last had an OV in April. He may have done an after hours visit in August, but I am unable to see any details from that.

## 2022-06-12 NOTE — Telephone Encounter (Signed)
Is he still taking the Zoloft '200mg'$  daily. He was on Celexa prior to Zoloft.

## 2022-06-13 ENCOUNTER — Encounter: Payer: Self-pay | Admitting: *Deleted

## 2022-06-13 NOTE — Telephone Encounter (Signed)
Yes, 200 mg Zoloft.

## 2022-06-13 NOTE — Progress Notes (Signed)
Per request of [employer] case manager, Con Memos, faxed last office note and completed Treatment and Self Management Plan Request Form to [phone]. Copy of form to HIM to scan.

## 2022-06-13 NOTE — Telephone Encounter (Signed)
We can have him decrease it by '50mg'$  weekly see if symptoms start improving. Have him call us weekly with an update. We can restart the Celexa we he gets down to the '100mg'$  dose. We can discus that taper when he calls in two weeks.

## 2022-06-13 NOTE — Telephone Encounter (Signed)
Patient informed on how to taper the medication and he should let us know weekly how he is doing.

## 2022-06-16 ENCOUNTER — Inpatient Hospital Stay: Payer: Commercial Managed Care - HMO

## 2022-06-16 VITALS — BP 113/64 | HR 81 | Temp 97.5°F | Resp 17 | Ht 66.0 in | Wt 245.0 lb

## 2022-06-16 DIAGNOSIS — D509 Iron deficiency anemia, unspecified: Secondary | ICD-10-CM | POA: Diagnosis not present

## 2022-06-16 MED ORDER — SODIUM CHLORIDE 0.9 % IV SOLN
300.0000 mg | Freq: Once | INTRAVENOUS | Status: AC
  Administered 2022-06-16: 300 mg via INTRAVENOUS
  Filled 2022-06-16: qty 15

## 2022-06-16 MED ORDER — SODIUM CHLORIDE 0.9 % IV SOLN: Freq: Once | INTRAVENOUS | Status: AC

## 2022-06-16 NOTE — Patient Instructions (Signed)

## 2022-06-21 ENCOUNTER — Encounter: Payer: Self-pay | Admitting: Nurse Practitioner

## 2022-06-21 ENCOUNTER — Inpatient Hospital Stay: Payer: Commercial Managed Care - HMO

## 2022-06-21 ENCOUNTER — Inpatient Hospital Stay: Payer: Commercial Managed Care - HMO | Admitting: Nurse Practitioner

## 2022-06-21 VITALS — BP 145/76 | HR 100 | Temp 98.1°F | Resp 18 | Ht 66.0 in | Wt 245.0 lb

## 2022-06-21 DIAGNOSIS — C182 Malignant neoplasm of ascending colon: Secondary | ICD-10-CM

## 2022-06-21 DIAGNOSIS — D509 Iron deficiency anemia, unspecified: Secondary | ICD-10-CM

## 2022-06-21 LAB — CBC WITH DIFFERENTIAL (CANCER CENTER ONLY)
Abs Immature Granulocytes: 0.02 10*3/uL (ref 0.00–0.07)
Basophils Absolute: 0.1 10*3/uL (ref 0.0–0.1)
Basophils Relative: 1 %
Eosinophils Absolute: 0.4 10*3/uL (ref 0.0–0.5)
Eosinophils Relative: 7 %
HCT: 37.1 % — ABNORMAL LOW (ref 39.0–52.0)
Hemoglobin: 10.8 g/dL — ABNORMAL LOW (ref 13.0–17.0)
Immature Granulocytes: 0 %
Lymphocytes Relative: 17 %
Lymphs Abs: 1.1 10*3/uL (ref 0.7–4.0)
MCH: 21.3 pg — ABNORMAL LOW (ref 26.0–34.0)
MCHC: 29.1 g/dL — ABNORMAL LOW (ref 30.0–36.0)
MCV: 73.2 fL — ABNORMAL LOW (ref 80.0–100.0)
Monocytes Absolute: 0.4 10*3/uL (ref 0.1–1.0)
Monocytes Relative: 7 %
Neutro Abs: 4.2 10*3/uL (ref 1.7–7.7)
Neutrophils Relative %: 68 %
Platelet Count: 337 10*3/uL (ref 150–400)
RBC: 5.07 MIL/uL (ref 4.22–5.81)
RDW: 19.9 % — ABNORMAL HIGH (ref 11.5–15.5)
WBC Count: 6.1 10*3/uL (ref 4.0–10.5)
nRBC: 0 % (ref 0.0–0.2)

## 2022-06-21 LAB — FERRITIN: Ferritin: 128 ng/mL (ref 24–336)

## 2022-06-21 LAB — CEA (ACCESS): CEA (CHCC): 25.11 ng/mL — ABNORMAL HIGH (ref 0.00–5.00)

## 2022-06-21 NOTE — Progress Notes (Signed)
[place] [facility] OFFICE PROGRESS NOTE   Diagnosis: Colon cancer  INTERVAL HISTORY:   [name] returns as scheduled.  He completed IV Venofer 06/09/2022 and 06/16/2022.  He denies any signs of allergic reaction with the IV iron.  He tends to not feel well for a few days after each infusion, mild nausea.  Cough overall is better during the day.  He continues to have "coughing spells" at nighttime.  Nausea overall is better.  Less burning on his tongue.  He notes an alteration in taste.  Objective:  Vital signs in last 24 hours:  Blood pressure (!) 145/76, pulse 100, temperature 98.1 F (36.7 C), resp. rate 18, height _0  (1.676 m), weight 245 lb (111.1 kg), SpO2 98 %.    HEENT: No thrush or ulcers. Lymphatics: Palpable cervical or supraclavicular lymph nodes. Resp: Lungs clear bilaterally. Cardio: Regular rate and rhythm. GI: Abdomen soft and nontender.  No hepatosplenomegaly.  Healed midline surgical incision. Vascular: No leg edema.   Lab Results:  Lab Results  Component Value Date   WBC 7.0 06/02/2022   HGB 9.4 (L) 06/02/2022   HCT 31.8 (L) 06/02/2022   MCV 71.8 (L) 06/02/2022   PLT 338 06/02/2022   NEUTROABS 4.7 06/02/2022    Imaging:  No results found.  Medications: I have reviewed the patient's current medications.  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 secondary to nausea (patient discontinued Xeloda on day 11 due to colonoscopy) Colonoscopy  07/09/2020-nodular mucosa at the colonic anastomosis, biopsied; patent end-to-side ileocolonic anastomosis characterized by healthy-appearing mucosa and visible sutures; nonbleeding internal hemorrhoids, biopsy with benign ulcerated anastomotic mucosa with granulation tissue Cycle 4 adjuvant Xeloda 07/19/2020, 2000 mg every morning and 1500 mg every afternoon Cycle 5 adjuvant Xeloda 08/09/2020 Cycle 6 adjuvant Xeloda 08/31/2019 Cycle 7 adjuvant Xeloda 09/20/2020 Cycle 8 adjuvant Xeloda 10/11/2020 CTs 03/07/2021-no evidence of recurrent disease, hepatic steatosis, slight wall thickening at the junction of the descending and horizontal duodenum Mild elevation of CEA 2023 12/01/2021-Guardant Reveal-ct DNA detected PET 12/30/2021-hypermetabolic right liver lesion, hypermetabolic area in a loop of mid small bowel with an SUV of 12.5, review of PET images at GI tumor conference 01/11/2022-small bowel uptake felt to be a benign finding MRI liver 01/10/2022-solitary 1.2 cm right liver lesion between segments 7 and 8 compatible with metastasis, subtle changes suggesting cirrhosis, hepatic steatosis CT enteroscopy 02/02/2022-small bowel loop with hypermetabolic activity on PET continues to have a bandlike density along the margin felt to represent a diverticulum or adjacent nodal tissue.  Small bowel tumor not excluded.  3-4 mm left omental nodule 02/08/2022-biopsy and ablation of solitary liver lesion, adenocarcinoma consistent with metastatic colorectal adenocarcinoma CTs 03/31/2022-unchanged circumstantial soft tissue thickening surrounding a loop of mid to distal small bowel with an adjacent nodular focus measuring 1.2 cm.,  Ablation cavity in segment 7, no other evidence of metastatic disease Diagnostic laparoscopy 05/26/2022-mid small bowel mass-resected, well to moderately differentiated adenocarcinoma, 0/2 lymph nodes, morphology consistent with a colon primary, tumor involved full-thickness including serosa with  perineural and large vessel involvement Microcytic anemia secondary to #1-improved Colon  polyps-sessile serrated adenoma of the descending colon, tubular adenomas with focal high-grade dysplasia of the transverse colon on colonoscopy 02/12/2020, tubular adenoma of the mid ascending colon on the surgical specimen 04/12/2020 Family history of colon cancer Depression Anxiety/panic attacks Hypertension Hyperlipidemia Nausea secondary to Xeloda?-Resolved Gastroesophageal reflux disease-improved following discontinuation of Xeloda Anemia, microcytic; ferritin 6 01/24/2022; stool positive for blood x3 01/25/2022; 02/02/2022 CT abdomen/pelvis enterography-bowel loop demonstrating hypermetabolic activity continues to have a bandlike density along its margin possibly representing a diverticulum or adjacent nodal tissue, difficult to exclude small bowel tumor, 3 x 4 mm left omental nodule or lymph node, known right hepatic lobe tumor is occult on CT. Venofer weekly x3 beginning 03/03/2022 Negative capsule endoscopy 03/09/2022 Venofer 03/03/2022 for 3 weekly doses Venofer 05/03/2022, 05/19/2022 12.  Anterior left neck nodule 03/17/2022-cyst?,  Lymph node?  Disposition: Anthony Calhoun appears stable.  He continues to recover from the recent surgery.  We will follow-up on the CEA from today.  We reviewed the CBC.  Hemoglobin is better, will hopefully continue to improve over the next several weeks.  Ferritin is pending.  The cough is some better since beginning Protonix.  He will continue the same and knows he can also take Maalox or Mylanta.  He will return for lab and follow-up in approximately 4 weeks.  We are available to see him sooner if needed.      Ned Card ANP/GNP-BC   06/21/2022  2:32 PM

## 2022-06-22 ENCOUNTER — Encounter: Payer: Self-pay | Admitting: Oncology

## 2022-06-23 ENCOUNTER — Encounter: Payer: Self-pay | Admitting: Oncology

## 2022-07-06 ENCOUNTER — Encounter: Payer: Self-pay | Admitting: Oncology

## 2022-07-10 ENCOUNTER — Encounter (HOSPITAL_COMMUNITY): Payer: Self-pay

## 2022-07-10 ENCOUNTER — Other Ambulatory Visit: Payer: Self-pay | Admitting: Interventional Radiology

## 2022-07-10 ENCOUNTER — Ambulatory Visit (HOSPITAL_COMMUNITY)
Admission: RE | Admit: 2022-07-10 | Discharge: 2022-07-10 | Disposition: A | Discharge status: Home / Self Care | Payer: Commercial Managed Care - HMO | Source: Ambulatory Visit | Attending: Interventional Radiology | Admitting: Interventional Radiology

## 2022-07-10 DIAGNOSIS — C189 Malignant neoplasm of colon, unspecified: Secondary | ICD-10-CM

## 2022-07-10 DIAGNOSIS — C787 Secondary malignant neoplasm of liver and intrahepatic bile duct: Secondary | ICD-10-CM | POA: Insufficient documentation

## 2022-07-10 LAB — POCT I-STAT CREATININE: Creatinine, Ser: 0.6 mg/dL — ABNORMAL LOW (ref 0.61–1.24)

## 2022-07-10 MED ORDER — IOHEXOL 300 MG/ML  SOLN
100.0000 mL | Freq: Once | INTRAMUSCULAR | Status: AC | PRN
  Administered 2022-07-10: 100 mL via INTRAVENOUS

## 2022-07-10 MED ORDER — SODIUM CHLORIDE (PF) 0.9 % IJ SOLN
INTRAMUSCULAR | Status: AC
  Filled 2022-07-10: qty 50

## 2022-07-12 ENCOUNTER — Encounter: Payer: Self-pay | Admitting: Oncology

## 2022-07-13 ENCOUNTER — Other Ambulatory Visit: Payer: Self-pay | Admitting: *Deleted

## 2022-07-13 ENCOUNTER — Encounter: Payer: Self-pay | Admitting: *Deleted

## 2022-07-13 ENCOUNTER — Ambulatory Visit (INDEPENDENT_AMBULATORY_CARE_PROVIDER_SITE_OTHER): Payer: Commercial Managed Care - HMO | Admitting: Psychologist

## 2022-07-13 ENCOUNTER — Encounter: Payer: Self-pay | Admitting: Lab

## 2022-07-13 ENCOUNTER — Ambulatory Visit
Admission: RE | Admit: 2022-07-13 | Discharge: 2022-07-13 | Disposition: A | Discharge status: Home / Self Care | Payer: Commercial Managed Care - HMO | Source: Ambulatory Visit | Attending: Interventional Radiology | Admitting: Interventional Radiology

## 2022-07-13 DIAGNOSIS — F330 Major depressive disorder, recurrent, mild: Secondary | ICD-10-CM | POA: Diagnosis not present

## 2022-07-13 DIAGNOSIS — C189 Malignant neoplasm of colon, unspecified: Secondary | ICD-10-CM

## 2022-07-13 DIAGNOSIS — F411 Generalized anxiety disorder: Secondary | ICD-10-CM | POA: Diagnosis not present

## 2022-07-13 DIAGNOSIS — K769 Liver disease, unspecified: Secondary | ICD-10-CM

## 2022-07-13 DIAGNOSIS — C182 Malignant neoplasm of ascending colon: Secondary | ICD-10-CM

## 2022-07-13 HISTORY — PX: IR RADIOLOGIST EVAL & MGMT: IMG5224

## 2022-07-13 NOTE — Progress Notes (Signed)
Ordered restaging PET and notified managed care to work on [place].

## 2022-07-13 NOTE — Progress Notes (Signed)
                Harleen Fineberg, PsyD 

## 2022-07-13 NOTE — Progress Notes (Signed)
[facility] Counselor/Therapist Progress Note  Patient ID: Anthony Calhoun, MRN: [id number],    Date: 07/13/2022  Time Spent: 02:05 pm to 02:43 pm; total time: 38 minutes   This session was held via video webex teletherapy due to the coronavirus risk at this time. The patient consented to video teletherapy and was located at his home during this session. He is aware it is the responsibility of the patient to secure confidentiality on his end of the session. The provider was in a private home office for the duration of this session. Limits of confidentiality were discussed with the patient.   Treatment Type: Individual Therapy  Reported Symptoms: Anxiety and depression  Mental Status Exam: Appearance:  Well Groomed     Behavior: Appropriate  Motor: Normal  Speech/Language:  Clear and Coherent  Affect: Appropriate  Mood: normal  Thought process: normal  Thought content:   WNL  Sensory/Perceptual disturbances:   WNL  Orientation: oriented to person, place, and time/date  Attention: Good  Concentration: Good  Memory: WNL  Fund of knowledge:  Good  Insight:   Fair  Judgment:  Good  Impulse Control: Good   Risk Assessment: Danger to Self:  No Self-injurious Behavior: No Danger to Others: No Duty to Warn:no Physical Aggression / Violence:No  Access to Firearms a concern: No  Gang Involvement:No   Subjective: Beginning the session, patient described himself as doing poorly. After reviewing the treatment plan, patient disclosed that he has been informed that it appears as though despite being compliant with treatment, that the cancer has progressed. He spent the session processing thoughts and emotions related to this progression. He identified with cancer as part of his identity right now. He spent time reflecting on the idea of writing a song about cancer and naming cancer as a "bully". He indicated that he would try. He was agreeable to homework and following  up. He denied suicidal and homicidal ideation.    Interventions:  Worked on developing a therapeutic relationship with the patient using active listening and reflective statements. Provided emotional support using empathy and validation. Reviewed the treatment plan with the patient. Reviewed events since the intake. Spent time reflecting on the progression of the cancer. Normalized and validated expressed thoughts and emotions. Assisted in processing the idea of giving cancer a name. Validated the name that the patient expressed. Identified the theme of cancer as part of patient's identity. Used socratic questions to assist the patient. Identified playing music. Explored the idea of writing a song to "bully". Normalized and validated expressed thoughts and emotions. Provided empathic statements. Briefly began to explore what patient would like to do with his life right now. Assigned homework. Assessed for suicidal and homicidal ideation.   Homework: Write a song to "bully"  Next Session: Review homework and emotional support.   Diagnosis: F41.1 generalized anxiety disorder and F33.0 major depressive affective disorder, recurrent, mild  Plan:   Goals Work through the grieving process and face reality of own death Accept emotional support from others around them Live life to the fullest, event though time may be limited Become as knowledgeable about the medical condition  Reduce fear, anxiety about the health condition  Accept the illness Accept the role of psychological and behavioral factors  Stabilize anxiety level wile increasing ability to function Learn and implement coping skills that result in a reduction of anxiety  Alleviate depressive symptoms Recognize, accept, and cope with depressive feelings Develop healthy thinking patterns Develop healthy interpersonal relationships  Objectives target date for all objectives is 07/14/2023 Identify feelings associated with the  illness Family members share with each other feelings Identify the losses or limitations that have been experienced Verbalize acceptance of the reality of the medical condition Commit to learning and implement a proactive approach to managing personal stresses Verbalize an understanding of the medical condition Work with therapist to develop a plan for coping with stress Learn and implement skills for managing stress Engage in social, productive activities that are possible Engage in faith based activities implement positive imagery Identify coping skills and sources of emotional support Patient's partner and family members verbalize their fears regarding severity of health condition Identify sources of emotional distress  Learning and implement calming skills to reduce overall anxiety Learn and implement problem solving strategies Identify and engage in pleasant activities Learning and implement personal and interpersonal skills to reduce anxiety and improve interpersonal relationships Learn to accept limitations in life and commit to tolerating, rather than avoiding, unpleasant emotions while accomplishing meaningful goals Identify major life conflicts from the past and present that form the basis for present anxiety Learn and implement behavioral strategies Verbalize an understanding and resolution of current interpersonal problems Learn and implement problem solving and decision making skills Learn and implement conflict resolution skills to resolve interpersonal problems Verbalize an understanding of healthy and unhealthy emotions verbalize insight into how past relationships may be influence current experiences with depression Use mindfulness and acceptance strategies and increase value based behavior  Increase hopeful statements about the future.   Interventions Teach about stress and ways to handle stress Assist the patient in developing a coping action plan for  stressors Conduct skills based training for coping strategies Train problem focused skills Sort out what activities the individual can do Encourage patient to rely upon his/her spiritual faith Teach the patient to use guided imagery Probe and evaluate family's ability to provide emotional support Allow family to share their fears Assist the patient in identifying, sorting through, and verbalizing the various feelings generated by his/her medical condition Meet with family members  Ask patient list out limitations  Use stress inoculation training  Use Acceptance and Commitment Therapy to help client accept uncomfortable realities in order to accomplish value-consistent goals Reinforce the client's insight into the role of his/her past emotional pain and present anxiety  Discuss examples demonstrating that unrealistic worry overestimates the probability of threats and underestimate patient's ability  Assist the patient in analyzing his or her worries Help patient understand that avoidance is reinforcing  Behavioral activation help the client explore the relationship, nature of the dispute,  Help the client develop new interpersonal skills and relationships Conduct Problem so living therapy Teach conflict resolution skills Use a process-experiential approach Conduct TLDP Conduct ACT  The patient and clinician reviewed the treatment plan on 07/13/2022. The patient approved of the treatment plan  Conception Chancy, PsyD

## 2022-07-13 NOTE — Progress Notes (Signed)
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    Referring Physician(s): Michaelle Birks   History of Present Illness: Anthony Calhoun is a 56 y.o. male  with a history of moderately differentiated adenocarcinoma of the ascending colon (T4AN0 stage IIb) status post right colectomy in August 2021 with adjuvant Xeloda (8 cycles completed in February 2022).  Subsequent surveillance imaging demonstrated no evidence of recurrent disease.  However, CEA was mildly elevated and continued to rise in early 2023.  In April 2023 Guardant Reveal DNA testing demonstrated circulating adenocarcinoma DNA.  Therefore, PET/CT was performed in May 2023 identifying a solitary hypermetabolic right liver lesion.  Subsequently, MRI was performed also in May 2023 identifying a solitary 1.2 cm right liver lesion position between segment 7 and 8 with imaging features most compatible with metastasis.  Patient does have hepatic steatosis and subtle changes of cirrhosis.  Hepatocellular carcinoma is a consideration but considered less likely given the imaging features.   He is essentially asymptomatic although he does have some mild fatigue.  He explains to me that he has a long history of OCD and anxiety.  He presents with his fiance and is also recording the conversation so that he will be able to listen to it again and not forget anything.  He has already seen   Dr. Michaelle Birks felt that he was a suboptimal surgical candidate due to his underlying steatosis and possible early cirrhosis.  He was deemed a good candidate for percutaneous thermal ablation which he underwent on 02/07/2022.  The procedure was uneventful and he was discharged home the next day.  Biopsy was performed which confirmed adenocarcinoma of probable colonic primary origin.   He has since recovered from surgery.  Additional follow-up CT imaging was  obtained 07/10/2022.  CT 07/10/22:  1. Stable ablation site in the RIGHT hepatic lobe with no evidence of local recurrence. 2. Unfortunately, new subtle lesion in the more inferior RIGHT hepatic lobe is concerning for a second hepatic metastasis. Contrast enhanced MRI is likely best modality to image this lesion unless concern for extrahepatic metastasis than FDG PET scan would be advisable. ADDENDUM: Rounded lymph node in the RIGHT upper quadrant mesentery adjacent to the surgical bowel anastomosis measures 11 mm (image 84/2). This node is enlarged from pre surgical scan. Differential would include metastatic mesenteric lymph node versus reactive adenopathy related to surgery. With the new liver lesions described below, consider FDG PET scan for further characterization liver metastasis as well as above lymph node or any other extrahepatic malignancy.  We spoke over the phone.  Understandably, his anxiety is quite high given the above CT findings.  Physically, he feels well and has no specific complaints at this time.  Past Medical History:  Diagnosis Date   Anemia    Anxiety    Arthritis    back,    Cancer ([place])    cecum   Depression    Diabetes mellitus without complication ([place])    Dyspnea    started with anemia   GERD (gastroesophageal reflux disease)    History of kidney stones    Hypertension    Neuromuscular disorder ([place])    carpal tunnel    Past Surgical History:  Procedure Laterality Date   APPENDECTOMY     BOWEL RESECTION N/A 05/26/2022   Procedure: SMALL BOWEL RESECTION;  Surgeon: Leighton Ruff, MD;  Location: [facility];  Service: General;  Laterality: N/A;   COLON SURGERY     COLONOSCOPY     CYSTOSCOPY W/ URETEROSCOPY W/ LITHOTRIPSY  1981   IR RADIOLOGIST EVAL & MGMT  01/17/2022   IR RADIOLOGIST EVAL & MGMT  03/07/2022   LAPAROSCOPY N/A 05/26/2022   Procedure: LAPAROSCOPY DIAGNOSTIC;  Surgeon: Leighton Ruff, MD;  Location: [facility];  Service: General;  Laterality: N/A;    RADIOLOGY WITH ANESTHESIA N/A 02/08/2022   Procedure: CT MICROWAVE ABLATION;  Surgeon: Criselda Peaches, MD;  Location: [facility];  Service: Radiology;  Laterality: N/A;    Allergies: Chlorthalidone, Cyclobenzaprine, Gabapentin, Losartan potassium-hctz, and Prednisone  Medications: Prior to Admission medications   Medication Sig Start Date End Date Taking? Authorizing Provider  ALPRAZolam Duanne Moron) 1 MG tablet Take one tablet three times daily as needed for anxiety. 12/09/21   Mozingo, Berdie Ogren, NP  amLODipine (NORVASC) 5 MG tablet Take 5 mg by mouth daily. 03/11/20   [provider]  atorvastatin (LIPITOR) 40 MG tablet Take 40 mg by mouth daily. 02/18/20   [provider]  carvedilol (COREG) 6.25 MG tablet Take 6.25 mg by mouth 2 (two) times daily with a meal. 10/01/19   [provider]  chlorpheniramine-HYDROcodone (TUSSIONEX) 10-8 MG/5ML Take 5 mLs by mouth every 12 (twelve) hours as needed for cough. 06/02/22   Ladell Pier, MD  docusate sodium (COLACE) 100 MG capsule Take 1 capsule (100 mg total) by mouth 2 (two) times daily. Patient not taking: Reported on 05/22/2022 02/09/22   Allred, Darrell K, PA-C  magic mouthwash SOLN Take 5 mLs by mouth 4 (four) times daily as needed for mouth pain (swish for 1 minute and then spit for mouth pain). Patient not taking: Reported on 06/21/2022 06/02/22   Ladell Pier, MD  ondansetron (ZOFRAN) 8 MG tablet Take 1 tablet (8 mg total) by mouth every 8 (eight) hours as needed for nausea. 03/23/22   Ladell Pier, MD  oxyCODONE (OXY IR/ROXICODONE) 5 MG immediate release tablet Take 1 tablet (5 mg total) by mouth every 6 (six) hours as needed for severe pain. Patient not taking: Reported on 06/02/2022 05/28/22   Erroll Luna, MD  pantoprazole (PROTONIX) 40 MG tablet Take 1 tablet (40 mg total) by mouth daily. 06/02/22   Ladell Pier, MD  polyethylene glycol powder (GLYCOLAX) 17 GM/SCOOP powder Take 17 g by mouth  daily. Patient not taking: Reported on 06/02/2022 05/28/22 05/28/23  Erroll Luna, MD  promethazine (PHENERGAN) 25 MG tablet Take 25 mg by mouth every 6 (six) hours as needed for nausea or vomiting.    [provider]  sertraline (ZOLOFT) 100 MG tablet TAKE 2 TABLETS BY MOUTH EVERY DAY 12/09/21   Mozingo, Berdie Ogren, NP  triamcinolone (KENALOG) 0.1 % paste Use as directed 1 Application in the mouth or throat 2 (two) times daily as needed (oral irritation). 06/02/22   Ladell Pier, MD     No family history on file.  Social History   Socioeconomic History   Marital status: Divorced    Spouse name: Not on file   Number of children: Not on file   Years of education: Not on file   Highest education level: Not on file  Occupational History   Not on file  Tobacco Use   Smoking status: Never   Smokeless tobacco: Never  Vaping Use   Vaping Use: Never used  Substance and Sexual Activity   Alcohol use: No   Drug use: No  Sexual activity: Not on file  Other Topics Concern   Not on file  Social History Narrative   Not on file   Social Determinants of Health   Financial Resource Strain: Medium Risk (01/17/2022)   Overall Financial Resource Strain (CARDIA)    Difficulty of Paying Living Expenses: Somewhat hard  Food Insecurity: No Food Insecurity (05/26/2022)   Hunger Vital Sign    Worried About Running Out of Food in the Last Year: Never true    Ran Out of Food in the Last Year: Never true  Transportation Needs: No Transportation Needs (05/26/2022)   PRAPARE - Hydrologist (Medical): No    Lack of Transportation (Non-Medical): No  Physical Activity: Inactive (01/17/2022)   Exercise Vital Sign    Days of Exercise per Week: 0 days    Minutes of Exercise per Session: 0 min  Stress: Stress Concern Present (01/17/2022)   [place]    Feeling of Stress : Rather much  Social  Connections: Socially Isolated (01/17/2022)   Social Connection and Isolation Panel [NHANES]    Frequency of Communication with Friends and Family: Three times a week    Frequency of Social Gatherings with Friends and Family: Three times a week    Attends Religious Services: Never    Active Member of Clubs or Organizations: No    Attends Archivist Meetings: Never    Marital Status: Divorced    ECOG Status: 0 - Asymptomatic  Review of Systems  Review of Systems: A 12 point ROS discussed and pertinent positives are indicated in the HPI above.  All other systems are negative.    Physical Exam No direct physical exam was performed (except for noted visual exam findings with Video Visits).    Vital Signs: There were no vitals taken for this visit.  Imaging: CT ABDOMEN W WO CONTRAST  Addendum Date: 07/12/2022   ADDENDUM REPORT: 07/12/2022 16:47 ADDENDUM: Rounded lymph node in the RIGHT upper quadrant mesentery adjacent to the surgical bowel anastomosis measures 11 mm (image 84/2). This node is enlarged from pre surgical scan. Differential would include metastatic mesenteric lymph node versus reactive adenopathy related to surgery. With the new liver lesions described below, consider FDG PET scan for further characterization liver metastasis as well as above lymph node or any other extrahepatic malignancy. Findings conveyed [facility] on 07/12/2022  at16:46. Electronically Signed   By: Suzy Bouchard M.D.   On: 07/12/2022 16:47   Result Date: 07/12/2022 CLINICAL DATA:  Post percutaneous thermal ablation of colorectal carcinoma liver metastasis. Ablation 02/08/2022. EXAM: CT ABDOMEN WITHOUT AND WITH CONTRAST TECHNIQUE: Multidetector CT imaging of the abdomen was performed following the standard protocol before and following the bolus administration of intravenous contrast. RADIATION DOSE REDUCTION: This exam was performed according to the departmental dose-optimization  program which includes automated exposure control, adjustment of the mA and/or kV according to patient size and/or use of iterative reconstruction technique. CONTRAST:  164m OMNIPAQUE IOHEXOL 300 MG/ML  SOLN COMPARISON:  CT 03/31/2022, MRI 01/10/2022 FINDINGS: Lower chest:  Lung bases are clear. Hepatobiliary: Oblong ablation defect (image 30/2) in the RIGHT hepatic lobe measures 3.0 by 2.1 cm compared to 3.7 x 2.4 cm on most recent CT exam. No enhancing elements of the ablation defect or interval growth. Mild subcapsular retraction along the probe insertion site. Within the inferior RIGHT hepatic lobe, there is a subtle hypodense lesion measuring 13 mm x 9 mm (  image 72/2) evident on the portal venous phase postcontrast series. Lesion new from comparison MRI and PET scan. Lesion subtly evident in retrospect on most recent CT scan (image 76/2). The caudate lobe is enlarged. Pancreas: Normal pancreatic parenchymal intensity. No ductal dilatation or inflammation. Spleen: Normal spleen. Adrenals/urinary tract: Adrenal glands and kidneys are normal. Stomach/Bowel: Stomach and limited of the small bowel is unremarkable. Small bowel anastomosis in the RIGHT abdomen obstruction Vascular/Lymphatic: Small periaortic retroperitoneal nodes retrocrural nodes similar prior. Retrocrural node measuring 6 mm image 29/2 Musculoskeletal: No aggressive osseous lesion IMPRESSION: 1. Stable ablation site in the RIGHT hepatic lobe with no evidence of local recurrence. 2. Unfortunately, new subtle lesion in the more inferior RIGHT hepatic lobe is concerning for a second hepatic metastasis. Contrast enhanced MRI is likely best modality to image this lesion unless concern for extrahepatic metastasis than FDG PET scan would be advisable. 3. The caudate lobe is prominent.  Query underlying cirrhosis. These results will be called to the ordering clinician or representative by the Radiologist Assistant, and communication documented in the PACS  or [employer]. Electronically Signed: By: Suzy Bouchard M.D. On: 07/11/2022 15:49    Labs:  CBC: Recent Labs    05/26/22 0900 05/27/22 0527 06/02/22 1415 06/21/22 1418  WBC 6.5 8.4 7.0 6.1  HGB 9.8* 9.6* 9.4* 10.8*  HCT 33.3* 33.8* 31.8* 37.1*  PLT 358 377 338 337    COAGS: Recent Labs    02/08/22 1250  INR 1.0    BMP: Recent Labs    02/23/22 0758 05/18/22 1119 05/26/22 0900 05/27/22 0527 07/10/22 1532  NA 141 136 136 138  --   K 4.8 3.8 3.8 4.1  --   CL 102 102 105 104  --   CO2 '28 23 24 27  '$ --   GLUCOSE 136* 221* 150* 172*  --   BUN '10 8 7 8  '$ --   CALCIUM 9.1 8.9 8.4* 8.4*  --   CREATININE 0.89 0.73 0.68 0.67 0.60*  GFRNONAA >60 >60 >60 >60  --     LIVER FUNCTION TESTS: Recent Labs    02/08/22 1250 05/18/22 1119 05/26/22 0900  BILITOT 0.6 0.6 0.9  AST '25 23 29  '$ ALT '20 20 21  '$ ALKPHOS 93 204* 192*  PROT 7.3 7.5 7.5  ALBUMIN 3.6 3.5 3.0*    TUMOR MARKERS: Recent Labs    02/23/22 0758 03/24/22 1256 04/27/22 1411 06/21/22 1418  CEA 7.72* 8.14* 19.56* 25.11*    Assessment and Plan:  Very pleasant 56 year old gentleman with a history of colon cancer metastatic to the small bowel and liver.  His prior ablation site looks good without evidence of recurrent disease.  Unfortunately, there is a subtle new lesion more inferiorly in hepatic segment 6 as well as a concerning lymph node in the abdominal mesentery which may represent another focus of metastatic disease.  I discussed his treatment plan with Dr. Benay Spice.  We agree that the first step is confirming metastatic disease.  Given the possibility of multiple foci, a PET/CT is most reasonable.  Dr. Gearldine Shown office will arrange this.  If this does turn out to be metastatic disease as suspected, Mr. Dunavant would best benefit from systemic therapy.  Dr. Benay Spice will discuss the options with him in detail.  At this point, we will continue to monitor his care and will be available should  any additional directed therapies be warranted.     Electronically Signed: Antonietta Jewel Myelle Poteat 07/13/2022, 11:32 AM  I spent a total of  15 Minutes in remote  clinical consultation, greater than 50% of which was counseling/coordinating care for colon cancer metastatic to liver and small bowel.    Visit type: Audio only (telephone). Audio (no video) only due to patient preference. Alternative for in-person consultation at [facility], [address], [place], [place]. This visit type was conducted due to national recommendations for restrictions regarding the COVID-19 Pandemic (e.g. social distancing).  This format is felt to be most appropriate for this patient at this time.  All issues noted in this document were discussed and addressed.

## 2022-07-18 ENCOUNTER — Inpatient Hospital Stay (HOSPITAL_BASED_OUTPATIENT_CLINIC_OR_DEPARTMENT_OTHER): Payer: Commercial Managed Care - HMO | Admitting: Oncology

## 2022-07-18 ENCOUNTER — Inpatient Hospital Stay: Payer: Commercial Managed Care - HMO | Attending: Oncology

## 2022-07-18 ENCOUNTER — Other Ambulatory Visit: Payer: Commercial Managed Care - HMO

## 2022-07-18 ENCOUNTER — Encounter: Payer: Self-pay | Admitting: *Deleted

## 2022-07-18 ENCOUNTER — Other Ambulatory Visit: Payer: Self-pay | Admitting: *Deleted

## 2022-07-18 ENCOUNTER — Encounter: Payer: Self-pay | Admitting: Oncology

## 2022-07-18 ENCOUNTER — Ambulatory Visit: Payer: Commercial Managed Care - HMO | Admitting: Oncology

## 2022-07-18 ENCOUNTER — Other Ambulatory Visit: Payer: Self-pay | Admitting: Nurse Practitioner

## 2022-07-18 ENCOUNTER — Inpatient Hospital Stay: Payer: Commercial Managed Care - HMO | Admitting: Licensed Clinical Social Worker

## 2022-07-18 VITALS — BP 125/74 | HR 90 | Temp 98.2°F | Resp 18 | Ht 66.0 in | Wt 244.8 lb

## 2022-07-18 DIAGNOSIS — I10 Essential (primary) hypertension: Secondary | ICD-10-CM | POA: Insufficient documentation

## 2022-07-18 DIAGNOSIS — F410 Panic disorder [episodic paroxysmal anxiety] without agoraphobia: Secondary | ICD-10-CM | POA: Diagnosis not present

## 2022-07-18 DIAGNOSIS — F32A Depression, unspecified: Secondary | ICD-10-CM | POA: Diagnosis not present

## 2022-07-18 DIAGNOSIS — C182 Malignant neoplasm of ascending colon: Secondary | ICD-10-CM | POA: Insufficient documentation

## 2022-07-18 DIAGNOSIS — Z9049 Acquired absence of other specified parts of digestive tract: Secondary | ICD-10-CM | POA: Diagnosis not present

## 2022-07-18 DIAGNOSIS — E785 Hyperlipidemia, unspecified: Secondary | ICD-10-CM | POA: Insufficient documentation

## 2022-07-18 DIAGNOSIS — D509 Iron deficiency anemia, unspecified: Secondary | ICD-10-CM | POA: Insufficient documentation

## 2022-07-18 DIAGNOSIS — K219 Gastro-esophageal reflux disease without esophagitis: Secondary | ICD-10-CM | POA: Insufficient documentation

## 2022-07-18 LAB — FERRITIN: Ferritin: 17 ng/mL — ABNORMAL LOW (ref 24–336)

## 2022-07-18 LAB — CBC WITH DIFFERENTIAL (CANCER CENTER ONLY)
Abs Immature Granulocytes: 0.02 10*3/uL (ref 0.00–0.07)
Basophils Absolute: 0.1 10*3/uL (ref 0.0–0.1)
Basophils Relative: 1 %
Eosinophils Absolute: 0.3 10*3/uL (ref 0.0–0.5)
Eosinophils Relative: 5 %
HCT: 38.9 % — ABNORMAL LOW (ref 39.0–52.0)
Hemoglobin: 11.6 g/dL — ABNORMAL LOW (ref 13.0–17.0)
Immature Granulocytes: 0 %
Lymphocytes Relative: 16 %
Lymphs Abs: 1 10*3/uL (ref 0.7–4.0)
MCH: 21.9 pg — ABNORMAL LOW (ref 26.0–34.0)
MCHC: 29.8 g/dL — ABNORMAL LOW (ref 30.0–36.0)
MCV: 73.4 fL — ABNORMAL LOW (ref 80.0–100.0)
Monocytes Absolute: 0.5 10*3/uL (ref 0.1–1.0)
Monocytes Relative: 8 %
Neutro Abs: 4.2 10*3/uL (ref 1.7–7.7)
Neutrophils Relative %: 70 %
Platelet Count: 303 10*3/uL (ref 150–400)
RBC: 5.3 MIL/uL (ref 4.22–5.81)
RDW: 19.7 % — ABNORMAL HIGH (ref 11.5–15.5)
WBC Count: 6.1 10*3/uL (ref 4.0–10.5)
nRBC: 0 % (ref 0.0–0.2)

## 2022-07-18 LAB — CEA (ACCESS): CEA (CHCC): 45.03 ng/mL — ABNORMAL HIGH (ref 0.00–5.00)

## 2022-07-18 NOTE — Progress Notes (Signed)
[facility] CSW Counseling Note  Patient was referred by medical provider. Treatment type: Individual  Presenting Concerns: Patient and/or family reports the following symptoms/concerns: anxiety and depression Duration of problem: Several years; Severity of problem: moderate   Orientation:oriented to person, place, time/date, situation, day of week, month of year, and year.   Affect: Congruent Risk of harm to self or others: No plan to harm self or others  Patient and/or Family's Strengths/Protective Factors: Social connections and Concrete supports in place (healthy food, safe environments, etc.)Capable of independent living  Marketing executive fund of knowledge  Motivation for treatment/growth  Supportive family/friends      Goals Addressed: Patient will:  Reduce symptoms of: anxiety and depression Increase knowledge and/or ability of: self-management skills  Increase healthy adjustment to current life circumstances   Progress towards Goals: Initial   Interventions: Interventions utilized:  Motivational Interviewing and Strength-based      Assessment: Patient currently experiencing anxiety related to need for a PAC.  His fiance, Katharine Look, was also present during visit.  Patient was focused on his cancer history.  He said he can only go from the kitchen to his bed.  Since patient has a history of depression and anxiety, briefly explored what has worked in the past to relieve his symptoms.    He said he has applied for disability through the [facility].  Sent an email to the contact to obtain status.  CSW will also provide the contact for [employer] to assist with financial counseling.  Patient would like to have a follow up session via telephone next week.      Plan: Follow up with CSW: November 28, 3pm Behavioral recommendations: Participate in self care. Referral(s): None at the moment.       Rodman Pickle Kamilo Och, LCSW

## 2022-07-18 NOTE — Progress Notes (Signed)
[facility] OFFICE PROGRESS NOTE   Diagnosis: Colon cancer  INTERVAL HISTORY:   Anthony Calhoun returns as scheduled.  He reports feeling depressed regarding results from the restaging CT.  He discussed the CT findings with Dr. Laurence Ferrari. He continues to have burning at the tongue and intermittent nausea.  Objective:  Vital signs in last 24 hours:  Blood pressure 125/74, pulse 90, temperature 98.2 F (36.8 C), temperature source Oral, resp. rate 18, height _0  (1.676 m), weight 244 lb 12.8 oz (111 kg), SpO2 98 %.    HEENT: No thrush or ulcers, the tongue appears unremarkable Lymphatics: No cervical, supraclavicular, axillary, or inguinal nodes Resp: Lungs clear bilaterally Cardio: Regular rate and rhythm GI: No mass, nontender, no hepatosplenomegaly Vascular: No leg edema   Lab Results:  Lab Results  Component Value Date   WBC 6.1 07/18/2022   HGB 11.6 (L) 07/18/2022   HCT 38.9 (L) 07/18/2022   MCV 73.4 (L) 07/18/2022   PLT 303 07/18/2022   NEUTROABS 4.2 07/18/2022    CMP  Lab Results  Component Value Date   NA 138 05/27/2022   K 4.1 05/27/2022   CL 104 05/27/2022   CO2 27 05/27/2022   GLUCOSE 172 (H) 05/27/2022   BUN 8 05/27/2022   CREATININE 0.60 (L) 07/10/2022   CALCIUM 8.4 (L) 05/27/2022   PROT 7.5 05/26/2022   ALBUMIN 3.0 (L) 05/26/2022   AST 29 05/26/2022   ALT 21 05/26/2022   ALKPHOS 192 (H) 05/26/2022   BILITOT 0.9 05/26/2022   GFRNONAA >60 05/27/2022   GFRAA >60 05/10/2020    Lab Results  Component Value Date   CEA1 1.08 03/07/2021   CEA 25.11 (H) 06/21/2022    Medications: I have reviewed the patient's current medications.   Assessment/Plan: Moderately differentiated adenocarcinoma of the ascending colon, stage IIb (T4aN0), status post a right colectomy 04/12/2020 Tumor invades the visceral peritoneum, 0/19 lymph nodes, no lymphovascular or perineural invasion, no tumor deposits, MSS, no loss of mismatch repair protein  expression Cycle 1 adjuvant Xeloda 05/17/2020 Cycle 2 adjuvant Xeloda 06/07/2020, Xeloda discontinued after 12 days secondary to nausea and rectal bleeding Cycle 3 adjuvant Xeloda 06/28/2020, dose reduced to 2000 mg a.m., 1500 mg p.m. secondary to nausea (patient discontinued Xeloda on day 11 due to colonoscopy) Colonoscopy 07/09/2020-nodular mucosa at the colonic anastomosis, biopsied; patent end-to-side ileocolonic anastomosis characterized by healthy-appearing mucosa and visible sutures; nonbleeding internal hemorrhoids, biopsy with benign ulcerated anastomotic mucosa with granulation tissue Cycle 4 adjuvant Xeloda 07/19/2020, 2000 mg every morning and 1500 mg every afternoon Cycle 5 adjuvant Xeloda 08/09/2020 Cycle 6 adjuvant Xeloda 08/31/2019 Cycle 7 adjuvant Xeloda 09/20/2020 Cycle 8 adjuvant Xeloda 10/11/2020 CTs 03/07/2021-no evidence of recurrent disease, hepatic steatosis, slight wall thickening at the junction of the descending and horizontal duodenum Mild elevation of CEA 2023 12/01/2021-Guardant Reveal-ct DNA detected PET 12/30/2021-hypermetabolic right liver lesion, hypermetabolic area in a loop of mid small bowel with an SUV of 12.5, review of PET images at GI tumor conference 01/11/2022-small bowel uptake felt to be a benign finding MRI liver 01/10/2022-solitary 1.2 cm right liver lesion between segments 7 and 8 compatible with metastasis, subtle changes suggesting cirrhosis, hepatic steatosis CT enteroscopy 02/02/2022-small bowel loop with hypermetabolic activity on PET continues to have a bandlike density along the margin felt to represent a diverticulum or adjacent nodal tissue.  Small bowel tumor not excluded.  3-4 mm left omental nodule 02/08/2022-biopsy and ablation of solitary liver lesion, adenocarcinoma consistent with metastatic colorectal adenocarcinoma  CTs 03/31/2022-unchanged circumstantial soft tissue thickening surrounding a loop of mid to distal small bowel with an adjacent nodular  focus measuring 1.2 cm.,  Ablation cavity in segment 7, no other evidence of metastatic disease Diagnostic laparoscopy 05/26/2022-mid small bowel mass-resected, well to moderately differentiated adenocarcinoma, 0/2 lymph nodes, morphology consistent with a colon primary, tumor involved full-thickness including serosa with perineural and large vessel involvement CT abdomen/pelvis 07/10/2022-the right liver ablation defect is smaller, new subtle right liver lesion measuring 13 x 9 mm, enlarged lymph node in the right upper quadrant mesentery adjacent to surgical anastomosis Microcytic anemia secondary to #1-improved Colon polyps-sessile serrated adenoma of the descending colon, tubular adenomas with focal high-grade dysplasia of the transverse colon on colonoscopy 02/12/2020, tubular adenoma of the mid ascending colon on the surgical specimen 04/12/2020 Family history of colon cancer Depression Anxiety/panic attacks Hypertension Hyperlipidemia Nausea secondary to Xeloda?-Resolved Gastroesophageal reflux disease-improved following discontinuation of Xeloda Anemia, microcytic; ferritin 6 01/24/2022; stool positive for blood x3 01/25/2022; 02/02/2022 CT abdomen/pelvis enterography-bowel loop demonstrating hypermetabolic activity continues to have a bandlike density along its margin possibly representing a diverticulum or adjacent nodal tissue, difficult to exclude small bowel tumor, 3 x 4 mm left omental nodule or lymph node, known right hepatic lobe tumor is occult on CT. Venofer weekly x3 beginning 03/03/2022 Negative capsule endoscopy 03/09/2022 Venofer 03/03/2022 for 3 weekly doses Venofer 05/03/2022, 05/19/2022 12.  Anterior left neck nodule 03/17/2022-cyst?,  Lymph node?   Disposition: Anthony Calhoun has metastatic colon cancer.  The CEA is higher.  There is radiologic evidence of disease progression.  I discussed the restaging CT findings and reviewed the images with Anthony Calhoun.  He understands the new  liver lesion and mesenteric lymph node likely indicate disease progression.  He does not appear to be a candidate for additional hepatic directed therapy or surgery.  He will be referred for a restaging PET.  If the PET confirms disease progression the plan is to begin systemic therapy.  My initial recommendation is to proceed with FOLFOX/bevacizumab.  We will submit tumor from the September small bowel resection for molecular testing.  I recommend Port-A-Cath placement for systemic therapy.  Anthony Calhoun would like Dr. Laurence Ferrari to place the Port-A-Cath.  He has persistent iron deficiency anemia.  He will receive IV iron on 07/28/2022.  He reports tolerating iron sucrose without evidence of an allergic reaction.  He will return for an office visit and further discussion on 07/28/2022.  Betsy Coder, MD  07/18/2022  8:31 AM

## 2022-07-18 NOTE — Progress Notes (Signed)
PATIENT NAVIGATOR PROGRESS NOTE  Name: Anthony Calhoun Date: 07/18/2022 MRN: [id number]  DOB: December 20, 1965   Reason for visit:  Port a cath placement  Comments:  Port placement scheduled at [facility] for August 03, 2022 with arrival at 1000, instructions given to patient. Dr Laurence Ferrari to place [facility]    Time spent counseling/coordinating care: 30-45 minutes

## 2022-07-19 ENCOUNTER — Encounter: Payer: Self-pay | Admitting: Oncology

## 2022-07-24 ENCOUNTER — Encounter: Payer: Self-pay | Admitting: Oncology

## 2022-07-25 ENCOUNTER — Inpatient Hospital Stay: Payer: Commercial Managed Care - HMO | Admitting: Licensed Clinical Social Worker

## 2022-07-25 NOTE — Progress Notes (Signed)
[facility] Calhoun Counseling Note  Patient was referred by medical provider. Treatment type: Individual  Presenting Concerns: Patient and/or family reports the following symptoms/concerns: anxiety and depression Duration of problem: Several years; Severity of problem: moderate   Orientation:oriented to person, place, time/date, situation, day of week, month of year, and year.   Affect: NA due to phone session. Risk of harm to self or others: No plan to harm self or others  Patient and/or Family's Strengths/Protective Factors: Social connections and Concrete supports in place (healthy food, safe environments, etc.)Active sense of humor  Capable of independent living  Communication skills  General fund of knowledge  Supportive family/friends      Goals Addressed: Patient will:  Reduce symptoms of: anxiety and depression Increase knowledge and/or ability of: healthy habits  Increase healthy adjustment to current life circumstances   Progress towards Goals: Progressing   Interventions: Interventions utilized:  CBT, Strength-based, Supportive, and Reframing      Assessment: Patient currently experiencing anxiety related to his cancer.  He feels a loss of control.  He expressed having insomnia and a burning tongue.  He is communicating with medical staff to try and have these symptoms resolved.  He appears to be experiencing grief from his loss of his previous self.  He said he currently receives care from [facility] [place].  Patient experienced his first panic attack when he was 56 years old.  He has been on Xanax for several years.  He has also had successful therapy in the past.  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  Discussed taking small steps towards his goals.      Plan: Follow up with Calhoun: December 5th at 3pm via telephone. Behavioral recommendations: Write a song about your feelings towards cancer. Referral(s): None at this time.       Rodman Pickle Anthony Murri,  LCSW

## 2022-07-26 ENCOUNTER — Ambulatory Visit
Admission: RE | Admit: 2022-07-26 | Discharge: 2022-07-26 | Disposition: A | Discharge status: Home / Self Care | Payer: Commercial Managed Care - HMO | Source: Ambulatory Visit | Attending: Oncology | Admitting: Oncology

## 2022-07-26 DIAGNOSIS — C786 Secondary malignant neoplasm of retroperitoneum and peritoneum: Secondary | ICD-10-CM | POA: Diagnosis not present

## 2022-07-26 DIAGNOSIS — C182 Malignant neoplasm of ascending colon: Secondary | ICD-10-CM

## 2022-07-26 DIAGNOSIS — C787 Secondary malignant neoplasm of liver and intrahepatic bile duct: Secondary | ICD-10-CM | POA: Diagnosis not present

## 2022-07-26 DIAGNOSIS — K6389 Other specified diseases of intestine: Secondary | ICD-10-CM

## 2022-07-26 DIAGNOSIS — C189 Malignant neoplasm of colon, unspecified: Secondary | ICD-10-CM | POA: Diagnosis not present

## 2022-07-26 DIAGNOSIS — K769 Liver disease, unspecified: Secondary | ICD-10-CM

## 2022-07-26 LAB — GLUCOSE, CAPILLARY: Glucose-Capillary: 167 mg/dL — ABNORMAL HIGH (ref 70–99)

## 2022-07-26 MED ORDER — FLUDEOXYGLUCOSE F - 18 (FDG) INJECTION
12.7000 | Freq: Once | INTRAVENOUS | Status: AC | PRN
  Administered 2022-07-26: 13.56 via INTRAVENOUS

## 2022-07-27 ENCOUNTER — Other Ambulatory Visit: Payer: Self-pay | Admitting: Oncology

## 2022-07-28 ENCOUNTER — Encounter: Payer: Self-pay | Admitting: *Deleted

## 2022-07-28 ENCOUNTER — Inpatient Hospital Stay (HOSPITAL_BASED_OUTPATIENT_CLINIC_OR_DEPARTMENT_OTHER): Payer: Commercial Managed Care - HMO | Admitting: Oncology

## 2022-07-28 ENCOUNTER — Ambulatory Visit: Payer: Commercial Managed Care - HMO | Admitting: Oncology

## 2022-07-28 ENCOUNTER — Encounter: Payer: Self-pay | Admitting: Oncology

## 2022-07-28 ENCOUNTER — Inpatient Hospital Stay: Payer: Commercial Managed Care - HMO | Attending: Oncology

## 2022-07-28 ENCOUNTER — Inpatient Hospital Stay: Payer: Commercial Managed Care - HMO

## 2022-07-28 VITALS — BP 144/88 | HR 104 | Temp 98.2°F | Resp 20 | Ht 66.0 in | Wt 243.8 lb

## 2022-07-28 VITALS — BP 127/85 | HR 95 | Temp 97.8°F | Resp 18

## 2022-07-28 DIAGNOSIS — F410 Panic disorder [episodic paroxysmal anxiety] without agoraphobia: Secondary | ICD-10-CM | POA: Diagnosis not present

## 2022-07-28 DIAGNOSIS — F32A Depression, unspecified: Secondary | ICD-10-CM | POA: Diagnosis not present

## 2022-07-28 DIAGNOSIS — E785 Hyperlipidemia, unspecified: Secondary | ICD-10-CM | POA: Diagnosis not present

## 2022-07-28 DIAGNOSIS — C182 Malignant neoplasm of ascending colon: Secondary | ICD-10-CM

## 2022-07-28 DIAGNOSIS — D509 Iron deficiency anemia, unspecified: Secondary | ICD-10-CM

## 2022-07-28 DIAGNOSIS — K219 Gastro-esophageal reflux disease without esophagitis: Secondary | ICD-10-CM | POA: Diagnosis not present

## 2022-07-28 DIAGNOSIS — K648 Other hemorrhoids: Secondary | ICD-10-CM | POA: Diagnosis not present

## 2022-07-28 DIAGNOSIS — Z79899 Other long term (current) drug therapy: Secondary | ICD-10-CM | POA: Insufficient documentation

## 2022-07-28 DIAGNOSIS — K760 Fatty (change of) liver, not elsewhere classified: Secondary | ICD-10-CM | POA: Insufficient documentation

## 2022-07-28 DIAGNOSIS — I10 Essential (primary) hypertension: Secondary | ICD-10-CM | POA: Diagnosis not present

## 2022-07-28 MED ORDER — SODIUM CHLORIDE 0.9 % IV SOLN
300.0000 mg | Freq: Once | INTRAVENOUS | Status: AC
  Administered 2022-07-28: 300 mg via INTRAVENOUS
  Filled 2022-07-28: qty 15

## 2022-07-28 MED ORDER — SODIUM CHLORIDE 0.9 % IV SOLN: Freq: Once | INTRAVENOUS | Status: AC

## 2022-07-28 NOTE — Patient Instructions (Signed)

## 2022-07-28 NOTE — Progress Notes (Signed)
Notified [facility] that Dr. Benay Spice is requesting Foundation One testing on case # [id number] dated 05/26/2022.  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

## 2022-07-28 NOTE — Progress Notes (Signed)
[place] OFFICE PROGRESS NOTE   Diagnosis: Colon cancer  INTERVAL HISTORY:   Anthony Calhoun returns as scheduled.  He continues to have burning of the tongue and nausea.  He has a poor appetite.  He is drinking boost.  Ondansetron does not help the nausea.  Phenergan helps partially.  He does not have significant pain.  Objective:  Vital signs in last 24 hours:  Blood pressure (!) 144/88, pulse (!) 104, temperature 98.2 F (36.8 C), temperature source Oral, resp. rate 20, height _0  (1.676 m), weight 243 lb 12.8 oz (110.6 kg), SpO2 98 %.    Resp: Lungs clear bilaterally Cardio: Rate and rhythm GI: No hepatosplenomegaly, no mass, nontender Vascular: No leg edema    Lab Results:  Lab Results  Component Value Date   WBC 6.1 07/18/2022   HGB 11.6 (L) 07/18/2022   HCT 38.9 (L) 07/18/2022   MCV 73.4 (L) 07/18/2022   PLT 303 07/18/2022   NEUTROABS 4.2 07/18/2022    CMP  Lab Results  Component Value Date   NA 138 05/27/2022   K 4.1 05/27/2022   CL 104 05/27/2022   CO2 27 05/27/2022   GLUCOSE 172 (H) 05/27/2022   BUN 8 05/27/2022   CREATININE 0.60 (L) 07/10/2022   CALCIUM 8.4 (L) 05/27/2022   PROT 7.5 05/26/2022   ALBUMIN 3.0 (L) 05/26/2022   AST 29 05/26/2022   ALT 21 05/26/2022   ALKPHOS 192 (H) 05/26/2022   BILITOT 0.9 05/26/2022   GFRNONAA >60 05/27/2022   GFRAA >60 05/10/2020    Lab Results  Component Value Date   CEA1 1.08 03/07/2021   CEA 45.03 (H) 07/18/2022    Imaging:  NM PET Image Restag (PS) Skull Base To Thigh  Result Date: 07/27/2022 CLINICAL DATA:  Subsequent treatment strategy for metastatic colon cancer. History of right hepatic lobe ablation procedure. Suspected new hepatic lesion on recent CT scan. EXAM: NUCLEAR MEDICINE PET SKULL BASE TO THIGH TECHNIQUE: 13.56 mCi F-18 FDG was injected intravenously. Full-ring PET imaging was performed from the skull base to thigh after the radiotracer. CT data was obtained and used  for attenuation correction and anatomic localization. Fasting blood glucose: 167 mg/dl COMPARISON:  Numerous prior imaging studies. The most recent PET-CT is 12/30/2021 FINDINGS: Mediastinal blood pool activity: SUV max 1.47 Liver activity: SUV max NA NECK: No hypermetabolic lymph nodes in the neck. Incidental CT findings: None. CHEST: No hypermetabolic mediastinal or hilar nodes. No suspicious pulmonary nodules on the CT scan. Incidental CT findings: Stable age advanced coronary artery calcifications. ABDOMEN/PELVIS: No hypermetabolism is demonstrated in or around the previously treated right hepatic lobe lesion. The new smaller lesions seen in segment 6 is difficult to identify on the noncontrast CT scan but it is hypermetabolic. SUV max is 5.52 and findings consistent with a new hepatic metastatic lesion. No other lesions are identified. No enlarged or hypermetabolic lymphadenopathy. The pancreas and adrenal glands are unremarkable. Unfortunately there is also an enlarging left-sided omental lesion which demonstrate hypermetabolism. The lesion measures maximum of 2.3 cm and measured 10 mm on the prior CT scan. SUV max is 3.58. 7 mm omental nodule is noted in the midline on image 180/2. No hypermetabolism is demonstrated. Peritoneal implant or mesenteric lymph node on image 183/2 measures a maximum of 18 mm and SUV max is 2.46. This was faintly present on the prior CT scan. Incidental CT findings: Stable surgical changes involving the bowel. Stable vascular calcifications. SKELETON: No findings suspicious for osseous  metastatic disease. Mild diffuse hypermetabolism likely due to rebound from chemotherapy or marrow stimulating drugs. Incidental CT findings: None. IMPRESSION: 1. New segment 6 metastatic hepatic lesion. 2. Previously treated right hepatic lobe lesion does not show any evidence of residual or recurrent tumor. 3. New/progressive peritoneal carcinomatosis as detailed above. Electronically Signed   By:  Marijo Sanes M.D.   On: 07/27/2022 10:25    Medications: I have reviewed the patient's current medications.   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 secondary to nausea (patient discontinued Xeloda on day 11 due to colonoscopy) Colonoscopy 07/09/2020-nodular mucosa at the colonic anastomosis, biopsied; patent end-to-side ileocolonic anastomosis characterized by healthy-appearing mucosa and visible sutures; nonbleeding internal hemorrhoids, biopsy with benign ulcerated anastomotic mucosa with granulation tissue Cycle 4 adjuvant Xeloda 07/19/2020, 2000 mg every morning and 1500 mg every afternoon Cycle 5 adjuvant Xeloda 08/09/2020 Cycle 6 adjuvant Xeloda 08/31/2019 Cycle 7 adjuvant Xeloda 09/20/2020 Cycle 8 adjuvant Xeloda 10/11/2020 CTs 03/07/2021-no evidence of recurrent disease, hepatic steatosis, slight wall thickening at the junction of the descending and horizontal duodenum Mild elevation of CEA 2023 12/01/2021-Guardant Reveal-ct DNA detected PET 12/30/2021-hypermetabolic right liver lesion, hypermetabolic area in a loop of mid small bowel with an SUV of 12.5, review of PET images at GI tumor conference 01/11/2022-small bowel uptake felt to be a benign finding MRI liver 01/10/2022-solitary 1.2 cm right liver lesion between segments 7 and 8 compatible with metastasis, subtle changes suggesting cirrhosis, hepatic steatosis CT enteroscopy 02/02/2022-small bowel loop with hypermetabolic activity on PET continues to have a  bandlike density along the margin felt to represent a diverticulum or adjacent nodal tissue.  Small bowel tumor not excluded.  3-4 mm left omental nodule 02/08/2022-biopsy and ablation of solitary liver lesion, adenocarcinoma consistent with metastatic colorectal adenocarcinoma CTs 03/31/2022-unchanged circumstantial soft tissue thickening surrounding a loop of mid to distal small bowel with an adjacent nodular focus measuring 1.2 cm.,  Ablation cavity in segment 7, no other evidence of metastatic disease Diagnostic laparoscopy 05/26/2022-mid small bowel mass-resected, well to moderately differentiated adenocarcinoma, 0/2 lymph nodes, morphology consistent with a colon primary, tumor involved full-thickness including serosa with perineural and large vessel involvement CT abdomen/pelvis 07/10/2022-the right liver ablation defect is smaller, new subtle right liver lesion measuring 13 x 9 mm, enlarged lymph node in the right upper quadrant mesentery adjacent to surgical anastomosis PET 07/26/2022-new segment 6 liver lesion, enlarging hypermetabolic left omental lesion, there is another mildly hypermetabolic peritoneal implant, 7 mm omental nodule at the midline without hypermetabolism Microcytic anemia secondary to #1-improved Colon polyps-sessile serrated adenoma of the descending colon, tubular adenomas with focal high-grade dysplasia of the transverse colon on colonoscopy 02/12/2020, tubular adenoma of the mid ascending colon on the surgical specimen 04/12/2020 Family history of colon cancer Depression Anxiety/panic attacks Hypertension Hyperlipidemia Nausea secondary to Xeloda?-Resolved Gastroesophageal reflux disease-improved following discontinuation of Xeloda Anemia, microcytic; ferritin 6 01/24/2022; stool positive for blood x3 01/25/2022; 02/02/2022 CT abdomen/pelvis enterography-bowel loop demonstrating hypermetabolic activity continues to have a bandlike density along its margin possibly representing a  diverticulum or adjacent nodal tissue, difficult to exclude small bowel tumor,  3 x 4 mm left omental nodule or lymph node, known right hepatic lobe tumor is occult on CT. Venofer weekly x3 beginning 03/03/2022 Negative capsule endoscopy 03/09/2022 Venofer 03/03/2022 for 3 weekly doses Venofer 05/03/2022, 05/19/2022 12.  Anterior left neck nodule 03/17/2022-cyst?,  Lymph node?     Disposition: Anthony Calhoun has metastatic colon cancer.  I reviewed the PET findings and images with him.  He understands no therapy will be curative.  We discussed treatment options.  It is unclear whether he has nausea is related to anxiety or the metastatic tumor burden.  We discussed observation versus a trial of systemic therapy.  He would like to proceed with systemic therapy, but wants to wait until after the holidays.  He is scheduled for Port-A-Cath placement next week.  Foundation 1 testing on the small bowel resection tissue is pending.  My initial recommendation is to proceed with FOLFOX/bevacizumab for treatment of the metastatic right-sided colon tumor.  He will return for an office visit and further discussion on 08/16/2022.  He will receive IV iron today.  Hopefully this will improve the tongue burning.  Betsy Coder, MD  07/28/2022  1:16 PM

## 2022-07-31 ENCOUNTER — Encounter: Payer: Self-pay | Admitting: Oncology

## 2022-08-01 ENCOUNTER — Encounter: Payer: Self-pay | Admitting: Oncology

## 2022-08-02 ENCOUNTER — Other Ambulatory Visit: Payer: Self-pay | Admitting: *Deleted

## 2022-08-02 ENCOUNTER — Encounter: Payer: Self-pay | Admitting: Oncology

## 2022-08-02 ENCOUNTER — Ambulatory Visit: Payer: Commercial Managed Care - HMO | Admitting: Psychologist

## 2022-08-02 NOTE — Progress Notes (Signed)
Patient requested refill on MMW. Per [facility], they still have script on file for him. Will prepare for him now.

## 2022-08-03 ENCOUNTER — Ambulatory Visit (HOSPITAL_COMMUNITY): Admission: RE | Admit: 2022-08-03 | Payer: Commercial Managed Care - HMO | Source: Ambulatory Visit

## 2022-08-03 ENCOUNTER — Inpatient Hospital Stay: Payer: Commercial Managed Care - HMO | Admitting: Licensed Clinical Social Worker

## 2022-08-04 ENCOUNTER — Encounter: Payer: Self-pay | Admitting: Oncology

## 2022-08-04 ENCOUNTER — Inpatient Hospital Stay: Payer: Commercial Managed Care - HMO

## 2022-08-04 ENCOUNTER — Telehealth: Payer: Self-pay

## 2022-08-04 VITALS — BP 121/70 | HR 71 | Temp 98.1°F | Resp 18

## 2022-08-04 DIAGNOSIS — D509 Iron deficiency anemia, unspecified: Secondary | ICD-10-CM

## 2022-08-04 DIAGNOSIS — C182 Malignant neoplasm of ascending colon: Secondary | ICD-10-CM | POA: Diagnosis not present

## 2022-08-04 MED ORDER — SODIUM CHLORIDE 0.9 % IV SOLN
300.0000 mg | Freq: Once | INTRAVENOUS | Status: AC
  Administered 2022-08-04: 300 mg via INTRAVENOUS
  Filled 2022-08-04: qty 15

## 2022-08-04 MED ORDER — SODIUM CHLORIDE 0.9 % IV SOLN: Freq: Once | INTRAVENOUS | Status: AC

## 2022-08-04 NOTE — Patient Instructions (Signed)

## 2022-08-04 NOTE — Telephone Encounter (Signed)
CSW attempted to contact patient to assess needs.  Left vm. 

## 2022-08-07 ENCOUNTER — Encounter: Payer: Self-pay | Admitting: Oncology

## 2022-08-08 ENCOUNTER — Telehealth: Payer: Self-pay

## 2022-08-08 ENCOUNTER — Encounter: Payer: Self-pay | Admitting: Oncology

## 2022-08-08 NOTE — Telephone Encounter (Signed)
Faxed [phone] over a medical clearance for dental treatment

## 2022-08-08 NOTE — Telephone Encounter (Signed)
Received fax from [facility] stating specimen has been received as of 08/05/22. Order number [id number]

## 2022-08-10 DIAGNOSIS — C787 Secondary malignant neoplasm of liver and intrahepatic bile duct: Secondary | ICD-10-CM | POA: Insufficient documentation

## 2022-08-10 DIAGNOSIS — C189 Malignant neoplasm of colon, unspecified: Secondary | ICD-10-CM | POA: Insufficient documentation

## 2022-08-10 DIAGNOSIS — K146 Glossodynia: Secondary | ICD-10-CM | POA: Insufficient documentation

## 2022-08-11 ENCOUNTER — Other Ambulatory Visit: Payer: Self-pay | Admitting: *Deleted

## 2022-08-11 ENCOUNTER — Encounter: Payer: Self-pay | Admitting: Oncology

## 2022-08-11 DIAGNOSIS — D509 Iron deficiency anemia, unspecified: Secondary | ICD-10-CM

## 2022-08-11 NOTE — Progress Notes (Signed)
Patient asking for lab work at next visit. OK to check CBC and ferritin at next appointment.

## 2022-08-14 ENCOUNTER — Encounter (HOSPITAL_COMMUNITY): Payer: Self-pay | Admitting: Oncology

## 2022-08-15 ENCOUNTER — Encounter: Payer: Self-pay | Admitting: Oncology

## 2022-08-16 ENCOUNTER — Inpatient Hospital Stay (HOSPITAL_BASED_OUTPATIENT_CLINIC_OR_DEPARTMENT_OTHER): Payer: Commercial Managed Care - HMO | Admitting: Nurse Practitioner

## 2022-08-16 ENCOUNTER — Inpatient Hospital Stay: Payer: Commercial Managed Care - HMO

## 2022-08-16 ENCOUNTER — Encounter: Payer: Self-pay | Admitting: Nurse Practitioner

## 2022-08-16 VITALS — BP 137/78 | HR 88 | Temp 98.1°F | Resp 18 | Ht 66.0 in | Wt 244.6 lb

## 2022-08-16 DIAGNOSIS — C182 Malignant neoplasm of ascending colon: Secondary | ICD-10-CM

## 2022-08-16 DIAGNOSIS — D509 Iron deficiency anemia, unspecified: Secondary | ICD-10-CM | POA: Diagnosis not present

## 2022-08-16 LAB — CBC WITH DIFFERENTIAL (CANCER CENTER ONLY)
Abs Immature Granulocytes: 0.01 10*3/uL (ref 0.00–0.07)
Basophils Absolute: 0 10*3/uL (ref 0.0–0.1)
Basophils Relative: 1 %
Eosinophils Absolute: 0.3 10*3/uL (ref 0.0–0.5)
Eosinophils Relative: 6 %
HCT: 42 % (ref 39.0–52.0)
Hemoglobin: 12.9 g/dL — ABNORMAL LOW (ref 13.0–17.0)
Immature Granulocytes: 0 %
Lymphocytes Relative: 18 %
Lymphs Abs: 0.9 10*3/uL (ref 0.7–4.0)
MCH: 23.6 pg — ABNORMAL LOW (ref 26.0–34.0)
MCHC: 30.7 g/dL (ref 30.0–36.0)
MCV: 76.9 fL — ABNORMAL LOW (ref 80.0–100.0)
Monocytes Absolute: 0.3 10*3/uL (ref 0.1–1.0)
Monocytes Relative: 7 %
Neutro Abs: 3.3 10*3/uL (ref 1.7–7.7)
Neutrophils Relative %: 68 %
Platelet Count: 261 10*3/uL (ref 150–400)
RBC: 5.46 MIL/uL (ref 4.22–5.81)
RDW: 19.9 % — ABNORMAL HIGH (ref 11.5–15.5)
WBC Count: 4.7 10*3/uL (ref 4.0–10.5)
nRBC: 0 % (ref 0.0–0.2)

## 2022-08-16 LAB — CMP (CANCER CENTER ONLY)
ALT: 17 U/L (ref 0–44)
AST: 23 U/L (ref 15–41)
Albumin: 4 g/dL (ref 3.5–5.0)
Alkaline Phosphatase: 190 U/L — ABNORMAL HIGH (ref 38–126)
Anion gap: 8 (ref 5–15)
BUN: 8 mg/dL (ref 6–20)
CO2: 27 mmol/L (ref 22–32)
Calcium: 9 mg/dL (ref 8.9–10.3)
Chloride: 101 mmol/L (ref 98–111)
Creatinine: 0.76 mg/dL (ref 0.61–1.24)
GFR, Estimated: >60 mL/min (ref >60)
Glucose, Bld: 339 mg/dL — ABNORMAL HIGH (ref 70–99)
Potassium: 3.5 mmol/L (ref 3.5–5.1)
Sodium: 136 mmol/L (ref 135–145)
Total Bilirubin: 0.7 mg/dL (ref 0.3–1.2)
Total Protein: 7.6 g/dL (ref 6.5–8.1)

## 2022-08-16 LAB — FERRITIN: Ferritin: 83 ng/mL (ref 24–336)

## 2022-08-16 NOTE — Progress Notes (Addendum)
[place] OFFICE PROGRESS NOTE   Diagnosis: Colon cancer  INTERVAL HISTORY:   Anthony Calhoun returns as scheduled.  The tongue burning has resolved.  Nausea is now occurring intermittently.  He denies pain.  Slight improvement in appetite.  He notes a somewhat loose stool several minutes after eating.  Objective:  Vital signs in last 24 hours:  Blood pressure 137/78, pulse 88, temperature 98.1 F (36.7 C), temperature source Oral, resp. rate 18, height _0  (1.676 m), weight 244 lb 9.6 oz (110.9 kg), SpO2 98 %.    Resp: Lungs clear bilaterally. Cardio: Regular rate and rhythm. GI: Abdomen soft and nontender.  No hepatosplenomegaly. Vascular: No leg edema. Neuro: Alert and oriented.   Lab Results:  Lab Results  Component Value Date   WBC 4.7 08/16/2022   HGB 12.9 (L) 08/16/2022   HCT 42.0 08/16/2022   MCV 76.9 (L) 08/16/2022   PLT 261 08/16/2022   NEUTROABS 3.3 08/16/2022    Imaging:  No results found.  Medications: I have reviewed the patient's current medications.  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 secondary to nausea (patient discontinued Xeloda on day 11 due to colonoscopy) Colonoscopy 07/09/2020-nodular mucosa at the colonic anastomosis, biopsied; patent end-to-side ileocolonic anastomosis characterized by healthy-appearing mucosa and visible sutures; nonbleeding internal hemorrhoids, biopsy with benign ulcerated anastomotic mucosa with granulation  tissue Cycle 4 adjuvant Xeloda 07/19/2020, 2000 mg every morning and 1500 mg every afternoon Cycle 5 adjuvant Xeloda 08/09/2020 Cycle 6 adjuvant Xeloda 08/31/2019 Cycle 7 adjuvant Xeloda 09/20/2020 Cycle 8 adjuvant Xeloda 10/11/2020 CTs 03/07/2021-no evidence of recurrent disease, hepatic steatosis, slight wall thickening at the junction of the descending and horizontal duodenum Mild elevation of CEA 2023 12/01/2021-Guardant Reveal-ct DNA detected PET 12/30/2021-hypermetabolic right liver lesion, hypermetabolic area in a loop of mid small bowel with an SUV of 12.5, review of PET images at GI tumor conference 01/11/2022-small bowel uptake felt to be a benign finding MRI liver 01/10/2022-solitary 1.2 cm right liver lesion between segments 7 and 8 compatible with metastasis, subtle changes suggesting cirrhosis, hepatic steatosis CT enteroscopy 02/02/2022-small bowel loop with hypermetabolic activity on PET continues to have a bandlike density along the margin felt to represent a diverticulum or adjacent nodal tissue.  Small bowel tumor not excluded.  3-4 mm left omental nodule 02/08/2022-biopsy and ablation of solitary liver lesion, adenocarcinoma consistent with metastatic colorectal adenocarcinoma CTs 03/31/2022-unchanged circumstantial soft tissue thickening surrounding a loop of mid to distal small bowel with an adjacent nodular focus measuring 1.2 cm.,  Ablation cavity in segment 7, no other evidence of metastatic disease Diagnostic laparoscopy 05/26/2022-mid small bowel mass-resected, well to moderately differentiated adenocarcinoma, 0/2 lymph nodes, morphology consistent with a colon primary, tumor involved full-thickness including serosa with perineural and large vessel involvement; foundation 1-microsatellite stable, tumor mutation burden 2, K-ras Q61H CT abdomen/pelvis 07/10/2022-the right liver ablation defect is smaller, new subtle right liver lesion measuring 13 x 9 mm, enlarged lymph node in the right upper  quadrant mesentery adjacent to surgical anastomosis PET 07/26/2022-new segment 6 liver lesion, enlarging hypermetabolic left omental lesion, there is another  mildly hypermetabolic peritoneal implant, 7 mm omental nodule at the midline without hypermetabolism Microcytic anemia secondary to #1-improved Colon polyps-sessile serrated adenoma of the descending colon, tubular adenomas with focal high-grade dysplasia of the transverse colon on colonoscopy 02/12/2020, tubular adenoma of the mid ascending colon on the surgical specimen 04/12/2020 Family history of colon cancer Depression Anxiety/panic attacks Hypertension Hyperlipidemia Nausea secondary to Xeloda?-Resolved Gastroesophageal reflux disease-improved following discontinuation of Xeloda Anemia, microcytic; ferritin 6 01/24/2022; stool positive for blood x3 01/25/2022; 02/02/2022 CT abdomen/pelvis enterography-bowel loop demonstrating hypermetabolic activity continues to have a bandlike density along its margin possibly representing a diverticulum or adjacent nodal tissue, difficult to exclude small bowel tumor, 3 x 4 mm left omental nodule or lymph node, known right hepatic lobe tumor is occult on CT. Venofer weekly x3 beginning 03/03/2022 Negative capsule endoscopy 03/09/2022 Venofer 03/03/2022 for 3 weekly doses Venofer 05/03/2022, 05/19/2022 Venofer 07/28/2022, 08/04/2022 12.  Anterior left neck nodule 03/17/2022-cyst?,  Lymph node?    Disposition: Anthony Calhoun appears unchanged.  We discussed beginning treatment with FOLFOX/bevacizumab.  He is undecided as to when he would like to begin treatment, he thinks may be mid January.  He understands a Port-A-Cath is required and it would be best to go ahead and get this scheduled.  He plans to contact Dr. Laurence Ferrari to schedule the Port-A-Cath in January.  He will let us know once this is scheduled so we can make arrangements for chemotherapy.  He is attending a chemotherapy education class today.    Ned Card ANP/GNP-BC   08/16/2022  1:48 PM

## 2022-08-17 ENCOUNTER — Encounter: Payer: Self-pay | Admitting: Oncology

## 2022-08-25 ENCOUNTER — Encounter: Payer: Self-pay | Admitting: Oncology

## 2022-08-29 ENCOUNTER — Telehealth (INDEPENDENT_AMBULATORY_CARE_PROVIDER_SITE_OTHER): Payer: Medicaid Other | Admitting: Adult Health

## 2022-08-29 ENCOUNTER — Encounter: Payer: Self-pay | Admitting: Adult Health

## 2022-08-29 ENCOUNTER — Encounter: Payer: Self-pay | Admitting: Nurse Practitioner

## 2022-08-29 DIAGNOSIS — F411 Generalized anxiety disorder: Secondary | ICD-10-CM | POA: Diagnosis not present

## 2022-08-29 DIAGNOSIS — F331 Major depressive disorder, recurrent, moderate: Secondary | ICD-10-CM

## 2022-08-29 DIAGNOSIS — F429 Obsessive-compulsive disorder, unspecified: Secondary | ICD-10-CM

## 2022-08-29 DIAGNOSIS — F330 Major depressive disorder, recurrent, mild: Secondary | ICD-10-CM

## 2022-08-29 DIAGNOSIS — F410 Panic disorder [episodic paroxysmal anxiety] without agoraphobia: Secondary | ICD-10-CM

## 2022-08-29 MED ORDER — SERTRALINE HCL 100 MG PO TABS: 100.0000 mg | ORAL_TABLET | Freq: Every day | ORAL | 3 refills | Status: DC

## 2022-08-29 MED ORDER — HYDROXYZINE HCL 25 MG PO TABS: 25.0000 mg | ORAL_TABLET | Freq: Three times a day (TID) | ORAL | 5 refills | Status: DC | PRN

## 2022-08-29 MED ORDER — ALPRAZOLAM 1 MG PO TABS: ORAL_TABLET | ORAL | 2 refills | Status: DC

## 2022-08-29 NOTE — Progress Notes (Signed)
Anthony Calhoun [id number] 24-Aug-1966 57 y.o.  Virtual Visit via Video Note  I connected with pt @ on 08/29/22 at  4:40 PM EST by a video enabled telemedicine application and verified that I am speaking with the correct person using two identifiers.   I discussed the limitations of evaluation and management by telemedicine and the availability of in person appointments. The patient expressed understanding and agreed to proceed.  I discussed the assessment and treatment plan with the patient. The patient was provided an opportunity to ask questions and all were answered. The patient agreed with the plan and demonstrated an understanding of the instructions.   The patient was advised to call back or seek an in-person evaluation if the symptoms worsen or if the condition fails to improve as anticipated.  I provided 25 minutes of non-face-to-face time during this encounter.  The patient was located at home.  The provider was located at [place].   Aloha Gell, NP   Subjective:   Patient ID:  Anthony Calhoun is a 57 y.o. (DOB 03/24/1966) male.  Chief Complaint: No chief complaint on file.   HPI Usbaldo Pannone Calhoun presents for follow-up of GAD, MDD, OCD, panic attacks.   Describes mood today as "better". Pleasant. Denies tearfulness. Mood symptoms - reports increased depression and anxiety - "a lot". Feels irritable at times - "angry". Reports increased worry and rumination. Reports 2 new tumors and will be restarting chemotherapy - upcoming PET scan. Stating "I'm worried" - "I've been told the cancer is incurable". Trying to keep a positive attitude. Varying interest and motivation. Taking medications as prescribed.  Energy levels lower. Active, does not have a regular exercise routine.  Enjoys some usual interests and activities. Engaged. Lives with fiance - has an adult son and daughter - 2 grandchildren. Spending time with family. Appetite decreased. Weight  loss - 44 pounds over past 2 months. Sleeping difficulties. Having nightmares. Focus and concentration stable. Completing tasks. Managing aspects of household. Out of work currently - totally disabled. Denies SI or HI.  Denies AH or VH.  Previous medication trials:  Celexa, Buspar  Review of Systems:  Review of Systems  Musculoskeletal:  Negative for gait problem.  Neurological:  Negative for tremors.  Psychiatric/Behavioral:         Please refer to HPI    Medications: I have reviewed the patient's current medications.  Current Outpatient Medications  Medication Sig Dispense Refill   hydrOXYzine (ATARAX) 25 MG tablet Take 1 tablet (25 mg total) by mouth 3 (three) times daily as needed. 90 tablet 5   ALPRAZolam (XANAX) 1 MG tablet Take one tablet three times daily as needed for anxiety. 90 tablet 2   amLODipine (NORVASC) 5 MG tablet Take 5 mg by mouth daily.     atorvastatin (LIPITOR) 40 MG tablet Take 40 mg by mouth daily.     carvedilol (COREG) 6.25 MG tablet Take 6.25 mg by mouth 2 (two) times daily with a meal.     docusate sodium (COLACE) 100 MG capsule Take 1 capsule (100 mg total) by mouth 2 (two) times daily. (Patient not taking: Reported on 05/22/2022) 10 capsule 0   magic mouthwash SOLN Take 5 mLs by mouth 4 (four) times daily as needed for mouth pain (swish for 1 minute and then spit for mouth pain). 240 mL 1   ondansetron (ZOFRAN) 8 MG tablet Take 1 tablet (8 mg total) by mouth every 8 (eight) hours as needed for nausea. (Patient  not taking: Reported on 08/16/2022) 30 tablet 1   pantoprazole (PROTONIX) 40 MG tablet TAKE 1 TABLET BY MOUTH EVERY DAY 30 tablet 2   polyethylene glycol powder (GLYCOLAX) 17 GM/SCOOP powder Take 17 g by mouth daily. (Patient not taking: Reported on 06/02/2022) 255 g 1   promethazine (PHENERGAN) 25 MG tablet Take 25 mg by mouth every 6 (six) hours as needed for nausea or vomiting.     sertraline (ZOLOFT) 100 MG tablet Take 1 tablet (100 mg total) by  mouth daily. 90 tablet 3   triamcinolone (KENALOG) 0.1 % paste Use as directed 1 Application in the mouth or throat 2 (two) times daily as needed (oral irritation). (Patient not taking: Reported on 08/16/2022) 5 g 1   No current facility-administered medications for this visit.    Medication Side Effects: None  Allergies:  Allergies  Allergen Reactions   Chlorthalidone Swelling    Swelling of lips   Cyclobenzaprine Other (See Comments)    Unknown reaction   Gabapentin Nausea Only   Losartan Potassium-Hctz Other (See Comments)   Prednisone Anxiety    "panic attack" Panic attacks    Past Medical History:  Diagnosis Date   Anemia    Anxiety    Arthritis    back,    Cancer ([place])    cecum   Depression    Diabetes mellitus without complication (HCC)    Dyspnea    started with anemia   GERD (gastroesophageal reflux disease)    History of kidney stones    Hypertension    Neuromuscular disorder (HCC)    carpal tunnel    No family history on file.  Social History   Socioeconomic History   Marital status: Divorced    Spouse name: Not on file   Number of children: Not on file   Years of education: Not on file   Highest education level: Not on file  Occupational History   Not on file  Tobacco Use   Smoking status: Never   Smokeless tobacco: Never  Vaping Use   Vaping Use: Never used  Substance and Sexual Activity   Alcohol use: No   Drug use: No   Sexual activity: Not on file  Other Topics Concern   Not on file  Social History Narrative   Not on file   Social Determinants of Health   Financial Resource Strain: Medium Risk (01/17/2022)   Overall Financial Resource Strain (CARDIA)    Difficulty of Paying Living Expenses: Somewhat hard  Food Insecurity: No Food Insecurity (05/26/2022)   Hunger Vital Sign    Worried About Running Out of Food in the Last Year: Never true    Ran Out of Food in the Last Year: Never true  Transportation Needs: No Transportation  Needs (05/26/2022)   PRAPARE - Hydrologist (Medical): No    Lack of Transportation (Non-Medical): No  Physical Activity: Inactive (01/17/2022)   Exercise Vital Sign    Days of Exercise per Week: 0 days    Minutes of Exercise per Session: 0 min  Stress: Stress Concern Present (01/17/2022)   [place]    Feeling of Stress : Rather much  Social Connections: Socially Isolated (01/17/2022)   Social Connection and Isolation Panel [NHANES]    Frequency of Communication with Friends and Family: Three times a week    Frequency of Social Gatherings with Friends and Family: Three times a week  Attends Religious Services: Never    Active Member of Clubs or Organizations: No    Attends Archivist Meetings: Never    Marital Status: Divorced  Human resources officer Violence: Not At Risk (05/26/2022)   Humiliation, Afraid, Rape, and Kick questionnaire    Fear of Current or Ex-Partner: No    Emotionally Abused: No    Physically Abused: No    Sexually Abused: No    Past Medical History, Surgical history, Social history, and Family history were reviewed and updated as appropriate.   Please see review of systems for further details on the patient's review from today.   Objective:   Physical Exam:  There were no vitals taken for this visit.  Physical Exam Constitutional:      General: He is not in acute distress. Musculoskeletal:        General: No deformity.  Neurological:     Mental Status: He is alert and oriented to person, place, and time.     Coordination: Coordination normal.  Psychiatric:        Attention and Perception: Attention and perception normal. He does not perceive auditory or visual hallucinations.        Mood and Affect: Mood normal. Mood is not anxious or depressed. Affect is not labile, blunt, angry or inappropriate.        Speech: Speech normal.        Behavior: Behavior  normal.        Thought Content: Thought content normal. Thought content is not paranoid or delusional. Thought content does not include homicidal or suicidal ideation. Thought content does not include homicidal or suicidal plan.        Cognition and Memory: Cognition and memory normal.        Judgment: Judgment normal.     Comments: Insight intact     Lab Review:     Component Value Date/Time   NA 136 08/16/2022 1308   K 3.5 08/16/2022 1308   CL 101 08/16/2022 1308   CO2 27 08/16/2022 1308   GLUCOSE 339 (H) 08/16/2022 1308   BUN 8 08/16/2022 1308   CREATININE 0.76 08/16/2022 1308   CALCIUM 9.0 08/16/2022 1308   PROT 7.6 08/16/2022 1308   ALBUMIN 4.0 08/16/2022 1308   AST 23 08/16/2022 1308   ALT 17 08/16/2022 1308   ALKPHOS 190 (H) 08/16/2022 1308   BILITOT 0.7 08/16/2022 1308   GFRNONAA >60 08/16/2022 1308   GFRAA >60 05/10/2020 1543       Component Value Date/Time   WBC 4.7 08/16/2022 1308   WBC 8.4 05/27/2022 0527   RBC 5.46 08/16/2022 1308   HGB 12.9 (L) 08/16/2022 1308   HCT 42.0 08/16/2022 1308   PLT 261 08/16/2022 1308   MCV 76.9 (L) 08/16/2022 1308   MCH 23.6 (L) 08/16/2022 1308   MCHC 30.7 08/16/2022 1308   RDW 19.9 (H) 08/16/2022 1308   LYMPHSABS 0.9 08/16/2022 1308   MONOABS 0.3 08/16/2022 1308   EOSABS 0.3 08/16/2022 1308   BASOSABS 0.0 08/16/2022 1308    No results found for: "POCLITH", "LITHIUM"   No results found for: "PHENYTOIN", "PHENOBARB", "VALPROATE", "CBMZ"   .res Assessment: Plan:    Plan:  PDMP reviewed  Xanax '1mg'$  TID Zoloft '100mg'$  daily  Add Hydroxyzine '25mg'$  TID   RTC 3 months  Patient advised to contact office with any questions, adverse effects, or acute worsening in signs and symptoms.  Discussed potential benefits, risk, and side effects of benzodiazepines to include  potential risk of tolerance and dependence, as well as possible drowsiness.  Advised patient not to drive if experiencing drowsiness and to take lowest  possible effective dose to minimize risk of dependence and tolerance.   Time spent with patient was 25 minutes. Greater than 50% of face to face time with patient was spent on counseling and coordination of care.    Diagnoses and all orders for this visit:  Major depressive disorder, recurrent episode, mild (HCC)  Generalized anxiety disorder -     ALPRAZolam (XANAX) 1 MG tablet; Take one tablet three times daily as needed for anxiety. -     sertraline (ZOLOFT) 100 MG tablet; Take 1 tablet (100 mg total) by mouth daily. -     hydrOXYzine (ATARAX) 25 MG tablet; Take 1 tablet (25 mg total) by mouth 3 (three) times daily as needed.  Panic attacks -     ALPRAZolam (XANAX) 1 MG tablet; Take one tablet three times daily as needed for anxiety. -     sertraline (ZOLOFT) 100 MG tablet; Take 1 tablet (100 mg total) by mouth daily.  Obsessive-compulsive disorder, unspecified type -     sertraline (ZOLOFT) 100 MG tablet; Take 1 tablet (100 mg total) by mouth daily.  Major depressive disorder, recurrent episode, moderate (HCC) -     sertraline (ZOLOFT) 100 MG tablet; Take 1 tablet (100 mg total) by mouth daily.     Please see After Visit Summary for patient specific instructions.  No future appointments.  No orders of the defined types were placed in this encounter.     -------------------------------

## 2022-09-01 ENCOUNTER — Encounter: Payer: Self-pay | Admitting: Oncology

## 2022-09-05 ENCOUNTER — Encounter: Payer: Self-pay | Admitting: *Deleted

## 2022-09-05 ENCOUNTER — Encounter: Payer: Self-pay | Admitting: Oncology

## 2022-09-05 NOTE — Progress Notes (Signed)
Faxed clearance letter for xrays, exam and cleaning with no restrictions to [facility] [phone]

## 2022-09-06 ENCOUNTER — Encounter: Payer: Self-pay | Admitting: Oncology

## 2022-09-06 ENCOUNTER — Telehealth: Payer: Self-pay | Admitting: *Deleted

## 2022-09-06 NOTE — Telephone Encounter (Signed)
[name] reports dental assessment today reports the following: Infection in gums due to decay and will need 7 fillings Needs #4 extractions He will need to be referred to oral surgeon. Since he has Medicaid, all this needs to get approved, which can take 7 days to 1 month. Asking for Dr. Benay Spice to advise in what he needs to do in regards to this, port and chemotherapy?

## 2022-09-07 ENCOUNTER — Ambulatory Visit
Admission: RE | Admit: 2022-09-07 | Discharge: 2022-09-07 | Disposition: A | Discharge status: Home / Self Care | Payer: Commercial Managed Care - HMO | Source: Ambulatory Visit | Attending: Interventional Radiology | Admitting: Interventional Radiology

## 2022-09-07 ENCOUNTER — Other Ambulatory Visit: Payer: Self-pay | Admitting: Interventional Radiology

## 2022-09-07 ENCOUNTER — Encounter: Payer: Self-pay | Admitting: Oncology

## 2022-09-07 DIAGNOSIS — C182 Malignant neoplasm of ascending colon: Secondary | ICD-10-CM

## 2022-09-07 HISTORY — PX: IR RADIOLOGIST EVAL & MGMT: IMG5224

## 2022-09-07 NOTE — Progress Notes (Signed)
Chief Complaint: Patient was consulted remotely today (TeleHealth) for stage 4 colorectal cancer with liver mets at the request of [name].    Referring Physician(s): Vivan Agostino K  History of Present Illness: Anthony Calhoun is a 57 y.o. 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  Subsequent surveillance imaging demonstrated no evidence of recurrent disease.  However, CEA was mildly elevated and continued to rise in early 2023.  In April 2023 Guardant Reveal DNA testing demonstrated circulating adenocarcinoma DNA.  Therefore, PET/CT was performed in May 2023 identifying a solitary hypermetabolic right liver lesion.  Subsequently, MRI was performed also in May 2023 identifying a solitary 1.2 cm right liver lesion position between segment 7 and 8 with imaging features most compatible with metastasis.  Patient does have hepatic steatosis and subtle changes of cirrhosis.  Hepatocellular carcinoma is a consideration but considered less likely given the imaging features.   He is essentially asymptomatic although he does have some mild fatigue.  He explains to me that he has a long history of OCD and anxiety.  He presents with his fiance and is also recording the conversation so that he will be able to listen to it again and not forget anything.  He has already seen   Dr. Michaelle Birks felt that he was a suboptimal surgical candidate due to his underlying steatosis and possible early cirrhosis.  He was deemed a good candidate for percutaneous thermal ablation which he underwent on 02/07/2022.  The procedure was uneventful and he was discharged home the next day.  Biopsy was performed which confirmed adenocarcinoma of probable colonic primary origin.    He has since recovered from surgery.  Additional follow-up CT  imaging was obtained 07/10/2022.   CT 07/10/22:  1. Stable ablation site in the RIGHT hepatic lobe with no evidence of local recurrence. 2. Unfortunately, new subtle lesion in the more inferior RIGHT hepatic lobe is concerning for a second hepatic metastasis. Contrast enhanced MRI is likely best modality to image this lesion unless concern for extrahepatic metastasis than FDG PET scan would be advisable. ADDENDUM: Rounded lymph node in the RIGHT upper quadrant mesentery adjacent to the surgical bowel anastomosis measures 11 mm (image 84/2). This node is enlarged from pre surgical scan. Differential would include metastatic mesenteric lymph node versus reactive adenopathy related to surgery. With the new liver lesions described below, consider FDG PET scan for further characterization liver metastasis as well as above lymph node or any other extrahepatic malignancy.   He requested an appointment with me this afternoon to discuss having a portacatheter placed.  Dr. Benay Spice has advised him to get a portacatheter as he will likely need chemotherapy.  1 complicating factor is the fact that he has poor dentition with multiple dental caries and periodontal disease.  This poses a slight risk for bacteremia and infection.  However, his cancer does require treatment quickly.  He has seen a dentist and is awaiting approval to see an oral surgeon for extractions.  Past Medical History:  Diagnosis Date   Anemia    Anxiety    Arthritis    back,    Cancer ([place])    cecum   Depression    Diabetes mellitus without complication ([place])    Dyspnea    started with anemia   GERD (gastroesophageal reflux disease)    History of  kidney stones    Hypertension    Neuromuscular disorder ([place])    carpal tunnel    Past Surgical History:  Procedure Laterality Date   APPENDECTOMY     BOWEL RESECTION N/A 05/26/2022   Procedure: SMALL BOWEL RESECTION;  Surgeon: Leighton Ruff, MD;  Location: [facility];  Service: General;   Laterality: N/A;   COLON SURGERY     COLONOSCOPY     CYSTOSCOPY W/ URETEROSCOPY W/ LITHOTRIPSY  1981   IR RADIOLOGIST EVAL & MGMT  01/17/2022   IR RADIOLOGIST EVAL & MGMT  03/07/2022   IR RADIOLOGIST EVAL & MGMT  07/13/2022   IR RADIOLOGIST EVAL & MGMT  09/07/2022   LAPAROSCOPY N/A 05/26/2022   Procedure: LAPAROSCOPY DIAGNOSTIC;  Surgeon: Leighton Ruff, MD;  Location: [facility];  Service: General;  Laterality: N/A;   RADIOLOGY WITH ANESTHESIA N/A 02/08/2022   Procedure: CT MICROWAVE ABLATION;  Surgeon: Criselda Peaches, MD;  Location: [facility];  Service: Radiology;  Laterality: N/A;    Allergies: Chlorthalidone, Cyclobenzaprine, Gabapentin, Losartan potassium-hctz, and Prednisone  Medications: Prior to Admission medications   Medication Sig Start Date End Date Taking? Authorizing Provider  ALPRAZolam Duanne Moron) 1 MG tablet Take one tablet three times daily as needed for anxiety. 08/29/22   Mozingo, Berdie Ogren, NP  amLODipine (NORVASC) 5 MG tablet Take 5 mg by mouth daily. 03/11/20   [provider]  atorvastatin (LIPITOR) 40 MG tablet Take 40 mg by mouth daily. 02/18/20   [provider]  carvedilol (COREG) 6.25 MG tablet Take 6.25 mg by mouth 2 (two) times daily with a meal. 10/01/19   [provider]  docusate sodium (COLACE) 100 MG capsule Take 1 capsule (100 mg total) by mouth 2 (two) times daily. Patient not taking: Reported on 05/22/2022 02/09/22   Allred, Darrell K, PA-C  hydrOXYzine (ATARAX) 25 MG tablet Take 1 tablet (25 mg total) by mouth 3 (three) times daily as needed. 08/29/22   Mozingo, Berdie Ogren, NP  magic mouthwash SOLN Take 5 mLs by mouth 4 (four) times daily as needed for mouth pain (swish for 1 minute and then spit for mouth pain). 06/02/22   Ladell Pier, MD  ondansetron (ZOFRAN) 8 MG tablet Take 1 tablet (8 mg total) by mouth every 8 (eight) hours as needed for nausea. Patient not taking: Reported on 08/16/2022 03/23/22   Ladell Pier, MD   pantoprazole (PROTONIX) 40 MG tablet TAKE 1 TABLET BY MOUTH EVERY DAY 07/27/22   Ladell Pier, MD  polyethylene glycol powder (GLYCOLAX) 17 GM/SCOOP powder Take 17 g by mouth daily. Patient not taking: Reported on 06/02/2022 05/28/22 05/28/23  Erroll Luna, MD  promethazine (PHENERGAN) 25 MG tablet Take 25 mg by mouth every 6 (six) hours as needed for nausea or vomiting.    [provider]  sertraline (ZOLOFT) 100 MG tablet Take 1 tablet (100 mg total) by mouth daily. 08/29/22   Mozingo, Berdie Ogren, NP  triamcinolone (KENALOG) 0.1 % paste Use as directed 1 Application in the mouth or throat 2 (two) times daily as needed (oral irritation). Patient not taking: Reported on 08/16/2022 06/02/22   Ladell Pier, MD     No family history on file.  Social History   Socioeconomic History   Marital status: Divorced    Spouse name: Not on file   Number of children: Not on file   Years of education: Not on file   Highest education level: Not on file  Occupational History  Not on file  Tobacco Use   Smoking status: Never   Smokeless tobacco: Never  Vaping Use   Vaping Use: Never used  Substance and Sexual Activity   Alcohol use: No   Drug use: No   Sexual activity: Not on file  Other Topics Concern   Not on file  Social History Narrative   Not on file   Social Determinants of Health   Financial Resource Strain: Medium Risk (01/17/2022)   Overall Financial Resource Strain (CARDIA)    Difficulty of Paying Living Expenses: Somewhat hard  Food Insecurity: No Food Insecurity (05/26/2022)   Hunger Vital Sign    Worried About Running Out of Food in the Last Year: Never true    Ran Out of Food in the Last Year: Never true  Transportation Needs: No Transportation Needs (05/26/2022)   PRAPARE - Hydrologist (Medical): No    Lack of Transportation (Non-Medical): No  Physical Activity: Inactive (01/17/2022)   Exercise Vital Sign    Days of  Exercise per Week: 0 days    Minutes of Exercise per Session: 0 min  Stress: Stress Concern Present (01/17/2022)   [place]    Feeling of Stress : Rather much  Social Connections: Socially Isolated (01/17/2022)   Social Connection and Isolation Panel [NHANES]    Frequency of Communication with Friends and Family: Three times a week    Frequency of Social Gatherings with Friends and Family: Three times a week    Attends Religious Services: Never    Active Member of Clubs or Organizations: No    Attends Archivist Meetings: Never    Marital Status: Divorced    ECOG Status: 1 - Symptomatic but completely ambulatory  Review of Systems  Review of Systems: A 12 point ROS discussed and pertinent positives are indicated in the HPI above.  All other systems are negative.    Physical Exam No direct physical exam was performed (except for noted visual exam findings with Video Visits).    Vital Signs: There were no vitals taken for this visit.  Imaging: IR Radiologist Eval & Mgmt  Result Date: 09/07/2022 EXAM: ESTABLISHED PATIENT OFFICE VISIT CHIEF COMPLAINT: SEE EPIC NOTE HISTORY OF PRESENT ILLNESS: SEE EPIC NOTE REVIEW OF SYSTEMS: SEE EPIC NOTE PHYSICAL EXAMINATION: SEE EPIC NOTE ASSESSMENT AND PLAN: SEE EPIC NOTE Electronically Signed   By: Jacqulynn Cadet M.D.   On: 09/07/2022 14:01    Labs:  CBC: Recent Labs    06/02/22 1415 06/21/22 1418 07/18/22 0750 08/16/22 1308  WBC 7.0 6.1 6.1 4.7  HGB 9.4* 10.8* 11.6* 12.9*  HCT 31.8* 37.1* 38.9* 42.0  PLT 338 337 303 261    COAGS: Recent Labs    02/08/22 1250  INR 1.0    BMP: Recent Labs    05/18/22 1119 05/26/22 0900 05/27/22 0527 07/10/22 1532 08/16/22 1308  NA 136 136 138  --  136  K 3.8 3.8 4.1  --  3.5  CL 102 105 104  --  101  CO2 '23 24 27  '$ --  27  GLUCOSE 221* 150* 172*  --  339*  BUN '8 7 8  '$ --  8  CALCIUM 8.9 8.4* 8.4*  --  9.0   CREATININE 0.73 0.68 0.67 0.60* 0.76  GFRNONAA >60 >60 >60  --  >60    LIVER FUNCTION TESTS: Recent Labs    02/08/22 1250 05/18/22 1119 05/26/22 0900  08/16/22 1308  BILITOT 0.6 0.6 0.9 0.7  AST '25 23 29 23  '$ ALT '20 20 21 17  '$ ALKPHOS 93 204* 192* 190*  PROT 7.3 7.5 7.5 7.6  ALBUMIN 3.6 3.5 3.0* 4.0    TUMOR MARKERS: Recent Labs    03/24/22 1256 04/27/22 1411 06/21/22 1418 07/18/22 0750  CEA 8.14* 19.56* 25.11* 45.03*    Assessment and Plan:  Pleasant 57 year old gentleman with stage IV colon cancer metastatic to the liver.  His oncologist, Dr. Benay Spice, recommended he get a portacatheter prying to initiate chemotherapy.  Due to his significant anxiety, he wanted to speak to me first.  I explained in detail the process of a portacatheter, the risks, benefits and alternatives.  He is a good candidate for a portacatheter.  We discussed placing the device in the hospital versus in our outpatient center.  He thinks he would do better in the outpatient center due to his anxiety.  He feels strongly that he wants me to place his port.  1.)  Please schedule for portacatheter placement at [facility] on 09/21/2022 if possible.  If not possible due to insurance or scheduling constraints, he can be scheduled at either [facility] or [facility] the next time I am scheduled there.     Electronically Signed: Criselda Peaches 09/07/2022, 2:26 PM   I spent a total of 10 Minutes in remote  clinical consultation, greater than 50% of which was counseling/coordinating care for stage 4 colorectal cancer.    Visit type: Audio only (telephone). Audio (no video) only due to patient preference. Alternative for in-person consultation at [facility] [place], [address] [employer], [place], [place]. This visit type was conducted due to national recommendations for restrictions regarding the COVID-19 Pandemic (e.g. social distancing).  This format is felt to be most appropriate for this patient  at this time.  All issues noted in this document were discussed and addressed.

## 2022-09-08 ENCOUNTER — Encounter: Payer: Self-pay | Admitting: Oncology

## 2022-09-08 ENCOUNTER — Other Ambulatory Visit: Payer: Self-pay | Admitting: *Deleted

## 2022-09-08 DIAGNOSIS — C182 Malignant neoplasm of ascending colon: Secondary | ICD-10-CM

## 2022-09-08 NOTE — Progress Notes (Signed)
Mr. Baller has scheduled his port and reports he is ready to start treatment on 09/25/22. Scheduling order sent.

## 2022-09-11 ENCOUNTER — Encounter: Payer: Self-pay | Admitting: Oncology

## 2022-09-18 ENCOUNTER — Encounter: Payer: Self-pay | Admitting: Oncology

## 2022-09-20 ENCOUNTER — Inpatient Hospital Stay: Payer: Commercial Managed Care - HMO | Attending: Oncology | Admitting: Licensed Clinical Social Worker

## 2022-09-20 ENCOUNTER — Other Ambulatory Visit: Payer: Commercial Managed Care - HMO | Admitting: Licensed Clinical Social Worker

## 2022-09-20 DIAGNOSIS — F410 Panic disorder [episodic paroxysmal anxiety] without agoraphobia: Secondary | ICD-10-CM | POA: Insufficient documentation

## 2022-09-20 DIAGNOSIS — I10 Essential (primary) hypertension: Secondary | ICD-10-CM | POA: Insufficient documentation

## 2022-09-20 DIAGNOSIS — E785 Hyperlipidemia, unspecified: Secondary | ICD-10-CM | POA: Insufficient documentation

## 2022-09-20 DIAGNOSIS — D509 Iron deficiency anemia, unspecified: Secondary | ICD-10-CM | POA: Insufficient documentation

## 2022-09-20 DIAGNOSIS — K219 Gastro-esophageal reflux disease without esophagitis: Secondary | ICD-10-CM | POA: Insufficient documentation

## 2022-09-20 DIAGNOSIS — C182 Malignant neoplasm of ascending colon: Secondary | ICD-10-CM | POA: Insufficient documentation

## 2022-09-20 DIAGNOSIS — Z79899 Other long term (current) drug therapy: Secondary | ICD-10-CM | POA: Insufficient documentation

## 2022-09-20 DIAGNOSIS — F32A Depression, unspecified: Secondary | ICD-10-CM | POA: Insufficient documentation

## 2022-09-20 DIAGNOSIS — Z5111 Encounter for antineoplastic chemotherapy: Secondary | ICD-10-CM | POA: Insufficient documentation

## 2022-09-20 NOTE — Discharge Instructions (Signed)
Implanted Port Insertion After Care   What can I expect after the procedure?   After the procedure, it is common to have:   Discomfort at the port insertion site.   Bruising on the skin over the port. This should improve over 3-4 days.    Port care   After your port is placed, you will get a manufacturer's information card. The card has information about your port. Keep this card with you at all times.   Take care of the port as told by your health care provider. Ask your health care provider if you or a family member can get training for taking care of the port at home.   Make sure to remember what type of port you have.       Incision care   Follow instructions from your health care provider about how to take care of your port insertion site. Make sure you:   Wash your hands with soap and water for at least 20 seconds before and after you change your bandage (dressing). If soap and water are not available, use hand sanitizer.        Leave your initial bandage on for a full 24 hours.     After 24 hours you may remove the dressing and shower.  Do not scrub directly on the incision site but rather above it and let the soapy water run over the incision.  Pat dry after.    You may then opt to redress your incision for your comfort but you may just leave it open to air.  The Dermabond (surgical super glue) will protect your incision and keep it clean and dry.    Leave the layer of skin glue in place. If the glue edges start to loosen and curl up, you may trim the loose edges but do not pull or pick at it.   Do NOT apply neosporin or other antibacterial ointment to the surgical glue.  It will dissolve the glue and expose your new incision to possible infection.     Do NOT apply EMLA numbing cream to the surgical glue.  It will dissolve the glue and expose your new incision to possible infection.  You may have to wait to use the EMLA cream until your incision has healed.    Check  your port insertion site every day for signs of infection. Check for:   Redness, swelling, or pain.   Fluid or blood.   Warmth.   Pus or a bad smell.      Activity   Return to your normal activities as told by your health care provider. Ask your health care provider what activities are safe for you.   You may have to avoid lifting. Ask your health care provider how much you can safely lift.   General instructions   Take over-the-counter and prescription medicines only as told by your health care provider.   Do not take baths, swim, or use a hot tub until your incision has healed completely (usually 2 weeks).    If you were given a sedative during the procedure, it can affect you for several hours. Do not drive, operate machinery or sign important documents for 24 hours after your procedure.   Keep all follow-up visits. This is important.   Please contact our office at [phone] for the following symptoms:   You have a fever or chills.   You have redness, swelling, or pain around your port insertion site.  You have fluid or blood coming from your port insertion site.   Your port insertion site feels warm to the touch.   You have pus or a bad smell coming from the port insertion site.   Get help right away if:   You have chest pain or shortness of breath.   You have bleeding from your port that you cannot control.   Do not wait to see if the symptoms will go away.   Do not drive yourself to the hospital.      These symptoms may be an emergency.    Get help right away. Call 911.     Summary   Take care of the port as told by your health care provider. Keep the manufacturer's information card with you at all times.   Keep your dressing on for 24 hours!  After that you can shower and redress the site only as needed.    No neosporin, antibiotic ointment or EMLA numbing cream on the glue over your incision   Do not submerge your incision under water in a  bath, pool or hot tube until fully healed   Contact a health care provider if you have a fever or chills or if you have redness, swelling, or pain around your port insertion site.   Keep all follow-up visits.   Thank you for visiting [facility] today!

## 2022-09-20 NOTE — Progress Notes (Signed)
[facility] CSW Progress Note  Clinical Education officer, museum returned patient's call.  He stated he was anxious about his port placement tomorrow.  He said he did not wish to discuss his feelings at this time, but will contact CSW in the future to schedule a counseling session.  He requested additional resources for financial assistance.  He said his disability has been approved, but he is not clear when this will start or when he will receive funds.  He would like to apply for the [employer].  CSW made referral to Otilio Carpen.  Per patient's request, will email patient [place] phone number and colon cancer grant information.  Patient expressed no other needs.  Provided active listening and supportive counseling.    Rodman Pickle Luverne Zerkle, LCSW

## 2022-09-21 ENCOUNTER — Ambulatory Visit
Admission: RE | Admit: 2022-09-21 | Discharge: 2022-09-21 | Disposition: A | Discharge status: Home / Self Care | Payer: Medicare Other | Source: Ambulatory Visit | Attending: Oncology | Admitting: Oncology

## 2022-09-21 ENCOUNTER — Encounter: Payer: Self-pay | Admitting: Nurse Practitioner

## 2022-09-21 ENCOUNTER — Other Ambulatory Visit: Payer: Commercial Managed Care - HMO

## 2022-09-21 DIAGNOSIS — C182 Malignant neoplasm of ascending colon: Secondary | ICD-10-CM

## 2022-09-21 HISTORY — PX: IR IMAGING GUIDED PORT INSERTION: IMG5740

## 2022-09-21 MED ORDER — CLINDAMYCIN PHOSPHATE 900 MG/50ML IV SOLN
900.0000 mg | Freq: Once | INTRAVENOUS | Status: AC
  Administered 2022-09-21: 900 mg via INTRAVENOUS

## 2022-09-21 MED ORDER — MIDAZOLAM HCL 2 MG/2ML IJ SOLN
1.0000 mg | INTRAMUSCULAR | Status: DC | PRN
  Administered 2022-09-21 (×5): 1 mg via INTRAVENOUS

## 2022-09-21 MED ORDER — FENTANYL CITRATE PF 50 MCG/ML IJ SOSY
25.0000 ug | PREFILLED_SYRINGE | INTRAMUSCULAR | Status: DC | PRN
  Administered 2022-09-21: 25 ug via INTRAVENOUS
  Administered 2022-09-21: 50 ug via INTRAVENOUS
  Administered 2022-09-21 (×2): 25 ug via INTRAVENOUS
  Administered 2022-09-21: 50 ug via INTRAVENOUS

## 2022-09-21 MED ORDER — SODIUM CHLORIDE 0.9 % IV SOLN: INTRAVENOUS | Status: DC

## 2022-09-21 NOTE — Progress Notes (Signed)
Pt back in nursing recovery area. Pt still drowsy from procedure but will wake up when spoken to. Pt follows commands, talks in complete sentences and has no complaints at this time. Pt will remain in nursing station until discharge.  ?

## 2022-09-22 ENCOUNTER — Encounter: Payer: Self-pay | Admitting: Oncology

## 2022-09-25 ENCOUNTER — Other Ambulatory Visit: Payer: Self-pay | Admitting: Nurse Practitioner

## 2022-09-25 ENCOUNTER — Encounter: Payer: Self-pay | Admitting: Nurse Practitioner

## 2022-09-25 ENCOUNTER — Encounter: Payer: Self-pay | Admitting: *Deleted

## 2022-09-25 ENCOUNTER — Inpatient Hospital Stay: Payer: Commercial Managed Care - HMO

## 2022-09-25 ENCOUNTER — Encounter: Payer: Self-pay | Admitting: Oncology

## 2022-09-25 ENCOUNTER — Other Ambulatory Visit: Payer: Self-pay | Admitting: *Deleted

## 2022-09-25 ENCOUNTER — Inpatient Hospital Stay (HOSPITAL_BASED_OUTPATIENT_CLINIC_OR_DEPARTMENT_OTHER): Payer: Commercial Managed Care - HMO | Admitting: Oncology

## 2022-09-25 VITALS — BP 119/66 | HR 71

## 2022-09-25 VITALS — BP 131/81 | HR 78 | Temp 98.2°F | Resp 18 | Ht 66.0 in | Wt 245.0 lb

## 2022-09-25 DIAGNOSIS — C189 Malignant neoplasm of colon, unspecified: Secondary | ICD-10-CM

## 2022-09-25 DIAGNOSIS — K219 Gastro-esophageal reflux disease without esophagitis: Secondary | ICD-10-CM | POA: Diagnosis not present

## 2022-09-25 DIAGNOSIS — D509 Iron deficiency anemia, unspecified: Secondary | ICD-10-CM | POA: Diagnosis present

## 2022-09-25 DIAGNOSIS — C182 Malignant neoplasm of ascending colon: Secondary | ICD-10-CM | POA: Diagnosis present

## 2022-09-25 DIAGNOSIS — C787 Secondary malignant neoplasm of liver and intrahepatic bile duct: Secondary | ICD-10-CM | POA: Diagnosis not present

## 2022-09-25 DIAGNOSIS — I10 Essential (primary) hypertension: Secondary | ICD-10-CM | POA: Diagnosis not present

## 2022-09-25 DIAGNOSIS — F410 Panic disorder [episodic paroxysmal anxiety] without agoraphobia: Secondary | ICD-10-CM | POA: Diagnosis not present

## 2022-09-25 DIAGNOSIS — Z79899 Other long term (current) drug therapy: Secondary | ICD-10-CM | POA: Diagnosis not present

## 2022-09-25 DIAGNOSIS — E785 Hyperlipidemia, unspecified: Secondary | ICD-10-CM | POA: Diagnosis not present

## 2022-09-25 DIAGNOSIS — F32A Depression, unspecified: Secondary | ICD-10-CM | POA: Diagnosis not present

## 2022-09-25 DIAGNOSIS — Z5111 Encounter for antineoplastic chemotherapy: Secondary | ICD-10-CM | POA: Diagnosis present

## 2022-09-25 LAB — CMP (CANCER CENTER ONLY)
ALT: 40 U/L (ref 0–44)
AST: 31 U/L (ref 15–41)
Albumin: 4.2 g/dL (ref 3.5–5.0)
Alkaline Phosphatase: 174 U/L — ABNORMAL HIGH (ref 38–126)
Anion gap: 7 (ref 5–15)
BUN: 10 mg/dL (ref 6–20)
CO2: 28 mmol/L (ref 22–32)
Calcium: 9.5 mg/dL (ref 8.9–10.3)
Chloride: 100 mmol/L (ref 98–111)
Creatinine: 0.75 mg/dL (ref 0.61–1.24)
GFR, Estimated: >60 mL/min (ref >60)
Glucose, Bld: 288 mg/dL — ABNORMAL HIGH (ref 70–99)
Potassium: 4.1 mmol/L (ref 3.5–5.1)
Sodium: 135 mmol/L (ref 135–145)
Total Bilirubin: 0.7 mg/dL (ref 0.3–1.2)
Total Protein: 8 g/dL (ref 6.5–8.1)

## 2022-09-25 LAB — CBC WITH DIFFERENTIAL (CANCER CENTER ONLY)
Abs Immature Granulocytes: 0.01 10*3/uL (ref 0.00–0.07)
Basophils Absolute: 0.1 10*3/uL (ref 0.0–0.1)
Basophils Relative: 1 %
Eosinophils Absolute: 0.2 10*3/uL (ref 0.0–0.5)
Eosinophils Relative: 4 %
HCT: 41.8 % (ref 39.0–52.0)
Hemoglobin: 13.5 g/dL (ref 13.0–17.0)
Immature Granulocytes: 0 %
Lymphocytes Relative: 21 %
Lymphs Abs: 1 10*3/uL (ref 0.7–4.0)
MCH: 24.7 pg — ABNORMAL LOW (ref 26.0–34.0)
MCHC: 32.3 g/dL (ref 30.0–36.0)
MCV: 76.6 fL — ABNORMAL LOW (ref 80.0–100.0)
Monocytes Absolute: 0.3 10*3/uL (ref 0.1–1.0)
Monocytes Relative: 7 %
Neutro Abs: 3.4 10*3/uL (ref 1.7–7.7)
Neutrophils Relative %: 67 %
Platelet Count: 212 10*3/uL (ref 150–400)
RBC: 5.46 MIL/uL (ref 4.22–5.81)
RDW: 16 % — ABNORMAL HIGH (ref 11.5–15.5)
WBC Count: 5 10*3/uL (ref 4.0–10.5)
nRBC: 0 % (ref 0.0–0.2)

## 2022-09-25 LAB — TOTAL PROTEIN, URINE DIPSTICK: Protein, ur: NEGATIVE mg/dL

## 2022-09-25 LAB — FERRITIN: Ferritin: 23 ng/mL — ABNORMAL LOW (ref 24–336)

## 2022-09-25 LAB — CEA (ACCESS): CEA (CHCC): 120.8 ng/mL — ABNORMAL HIGH (ref 0.00–5.00)

## 2022-09-25 MED ORDER — SODIUM CHLORIDE 0.9% FLUSH: 10.0000 mL | INTRAVENOUS | Status: DC | PRN

## 2022-09-25 MED ORDER — SODIUM CHLORIDE 0.9 % IV SOLN
5000.0000 mg | INTRAVENOUS | Status: DC
  Administered 2022-09-25: 5000 mg via INTRAVENOUS
  Filled 2022-09-25: qty 100

## 2022-09-25 MED ORDER — PALONOSETRON HCL INJECTION 0.25 MG/5ML
0.2500 mg | Freq: Once | INTRAVENOUS | Status: AC
  Administered 2022-09-25: 0.25 mg via INTRAVENOUS
  Filled 2022-09-25: qty 5

## 2022-09-25 MED ORDER — FLUOROURACIL CHEMO INJECTION 2.5 GM/50ML
400.0000 mg/m2 | Freq: Once | INTRAVENOUS | Status: AC
  Administered 2022-09-25: 900 mg via INTRAVENOUS
  Filled 2022-09-25: qty 18

## 2022-09-25 MED ORDER — DEXTROSE 5 % IV SOLN: Freq: Once | INTRAVENOUS | Status: AC

## 2022-09-25 MED ORDER — LEUCOVORIN CALCIUM INJECTION 350 MG
400.0000 mg/m2 | Freq: Once | INTRAVENOUS | Status: AC
  Administered 2022-09-25: 908 mg via INTRAVENOUS
  Filled 2022-09-25: qty 45.4

## 2022-09-25 MED ORDER — OXALIPLATIN CHEMO INJECTION 100 MG/20ML
85.0000 mg/m2 | Freq: Once | INTRAVENOUS | Status: AC
  Administered 2022-09-25: 195 mg via INTRAVENOUS
  Filled 2022-09-25: qty 39

## 2022-09-25 MED ORDER — SODIUM CHLORIDE 0.9 % IV SOLN
10.0000 mg | Freq: Once | INTRAVENOUS | Status: AC
  Administered 2022-09-25: 10 mg via INTRAVENOUS
  Filled 2022-09-25: qty 1

## 2022-09-25 NOTE — Progress Notes (Signed)
Patient seen by Dr. Benay Spice today  Vitals are within treatment parameters.  Labs reviewed by Dr. Benay Spice and are within treatment parameters.  Per physician team, patient is ready for treatment. Please note that modifications are being made to the treatment plan including Will not receive Avastin this cycle.

## 2022-09-25 NOTE — Progress Notes (Signed)
[facility] OFFICE PROGRESS NOTE   Diagnosis: Colon cancer  INTERVAL HISTORY:   [name] returns as scheduled.  He underwent placement of a Port-A-Cath on 09/21/2022.  He reports marked improvement in his sense of taste medially following the Port-A-Cath procedure.  He has mild nausea.  No other complaint.  Objective:  Vital signs in last 24 hours:  Blood pressure 131/81, pulse 78, temperature 98.2 F (36.8 C), resp. rate 18, height '5\' 6"'$  (1.676 m), weight 245 lb (111.1 kg), SpO2 98 %.    HEENT: No thrush or ulcers Lymphatics: No cervical, supraclavicular, axillary, or inguinal nodes Resp: Lungs clear bilaterally Cardio: Regular rate and rhythm GI: No hepatosplenomegaly, no mass, nontender Vascular: No leg edema   Portacath/PICC-without erythema  Lab Results:  Lab Results  Component Value Date   WBC 5.0 09/25/2022   HGB 13.5 09/25/2022   HCT 41.8 09/25/2022   MCV 76.6 (L) 09/25/2022   PLT 212 09/25/2022   NEUTROABS 3.4 09/25/2022    CMP  Lab Results  Component Value Date   NA 135 09/25/2022   K 4.1 09/25/2022   CL 100 09/25/2022   CO2 28 09/25/2022   GLUCOSE 288 (H) 09/25/2022   BUN 10 09/25/2022   CREATININE 0.75 09/25/2022   CALCIUM 9.5 09/25/2022   PROT 8.0 09/25/2022   ALBUMIN 4.2 09/25/2022   AST 31 09/25/2022   ALT 40 09/25/2022   ALKPHOS 174 (H) 09/25/2022   BILITOT 0.7 09/25/2022   GFRNONAA >60 09/25/2022   GFRAA >60 05/10/2020    Lab Results  Component Value Date   CEA1 1.08 03/07/2021   CEA 120.80 (H) 09/25/2022    Medications: I have reviewed the patient's current medications.   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 secondary to nausea (patient discontinued Xeloda on day 11 due to colonoscopy) Colonoscopy 07/09/2020-nodular mucosa at the colonic anastomosis, biopsied; patent end-to-side ileocolonic anastomosis characterized by healthy-appearing mucosa and visible sutures; nonbleeding internal hemorrhoids, biopsy with benign ulcerated anastomotic mucosa with granulation tissue Cycle 4 adjuvant Xeloda 07/19/2020, 2000 mg every morning and 1500 mg every afternoon Cycle 5 adjuvant Xeloda 08/09/2020 Cycle 6 adjuvant Xeloda 08/31/2019 Cycle 7 adjuvant Xeloda 09/20/2020 Cycle 8 adjuvant Xeloda 10/11/2020 CTs 03/07/2021-no evidence of recurrent disease, hepatic steatosis, slight wall thickening at the junction of the descending and horizontal duodenum Mild elevation of CEA 2023 12/01/2021-Guardant Reveal-ct DNA detected PET 12/30/2021-hypermetabolic right liver lesion, hypermetabolic area in a loop of mid small bowel with an SUV of 12.5, review of PET images at GI tumor conference 01/11/2022-small bowel uptake felt to be a benign finding MRI liver 01/10/2022-solitary 1.2 cm right liver lesion between segments 7 and 8 compatible with metastasis, subtle changes suggesting cirrhosis, hepatic steatosis CT enteroscopy 02/02/2022-small bowel loop with hypermetabolic activity on PET continues to have a bandlike density along the margin felt to represent a diverticulum or adjacent nodal tissue.  Small bowel tumor not excluded.  3-4 mm left omental nodule 02/08/2022-biopsy and ablation of solitary liver lesion, adenocarcinoma consistent with metastatic colorectal adenocarcinoma CTs 03/31/2022-unchanged circumstantial soft tissue thickening surrounding a  loop of mid to distal small bowel with an adjacent nodular focus measuring 1.2 cm.,  Ablation  cavity in segment 7, no other evidence of metastatic disease Diagnostic laparoscopy 05/26/2022-mid small bowel mass-resected, well to moderately differentiated adenocarcinoma, 0/2 lymph nodes, morphology consistent with a colon primary, tumor involved full-thickness including serosa with perineural and large vessel involvement; foundation 1-microsatellite stable, tumor mutation burden 2, K-ras Q61H CT abdomen/pelvis 07/10/2022-the right liver ablation defect is smaller, new subtle right liver lesion measuring 13 x 9 mm, enlarged lymph node in the right upper quadrant mesentery adjacent to surgical anastomosis PET 07/26/2022-new segment 6 liver lesion, enlarging hypermetabolic left omental lesion, there is another mildly hypermetabolic peritoneal implant, 7 mm omental nodule at the midline without hypermetabolism Cycle 1 FOLFOX 09/25/2022 Microcytic anemia secondary to #1-improved Colon polyps-sessile serrated adenoma of the descending colon, tubular adenomas with focal high-grade dysplasia of the transverse colon on colonoscopy 02/12/2020, tubular adenoma of the mid ascending colon on the surgical specimen 04/12/2020 Family history of colon cancer Depression Anxiety/panic attacks Hypertension Hyperlipidemia Nausea secondary to Xeloda?-Resolved Gastroesophageal reflux disease-improved following discontinuation of Xeloda Anemia, microcytic; ferritin 6 01/24/2022; stool positive for blood x3 01/25/2022; 02/02/2022 CT abdomen/pelvis enterography-bowel loop demonstrating hypermetabolic activity continues to have a bandlike density along its margin possibly representing a diverticulum or adjacent nodal tissue, difficult to exclude small bowel tumor, 3 x 4 mm left omental nodule or lymph node, known right hepatic lobe tumor is occult on CT. Venofer weekly x3 beginning 03/03/2022 Negative capsule endoscopy 03/09/2022 Venofer 03/03/2022 for 3 weekly doses Venofer 05/03/2022, 05/19/2022 Venofer 07/28/2022, 08/04/2022 12.   Anterior left neck nodule 03/17/2022-cyst?,  Lymph node?     Disposition: [name] appears stable.  He underwent Port-A-Cath placement last week.  The plan is to begin FOLFOX today.  Avastin will be added with cycle 2.  We reviewed potential toxicities associated with the FOLFOX regimen including the chance for mucositis, nausea, diarrhea, alopecia, hematologic toxicity, infection, and bleeding.  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  He agrees to proceed.  He will return for an office visit and chemotherapy in 2 weeks.  Betsy Coder, MD  09/25/2022  3:48 PM

## 2022-09-25 NOTE — Progress Notes (Signed)
Pharmacist Chemotherapy Monitoring - Initial Assessment    Anticipated start date: 09/25/22   The following has been reviewed per standard work regarding the patient's treatment regimen: The patient's diagnosis, treatment plan and drug doses, and organ/hematologic function Lab orders and baseline tests specific to treatment regimen  The treatment plan start date, drug sequencing, and pre-medications Prior authorization status  Patient's documented medication list, including drug-drug interaction screen and prescriptions for anti-emetics and supportive care specific to the treatment regimen The drug concentrations, fluid compatibility, administration routes, and timing of the medications to be used The patient's access for treatment and lifetime cumulative dose history, if applicable  The patient's medication allergies and previous infusion related reactions, if applicable   Changes made to treatment plan:  N/A  Follow up needed:  N/A   Patrica Duel, RPH, 09/25/2022  10:16 AM

## 2022-09-25 NOTE — Patient Instructions (Signed)

## 2022-09-25 NOTE — Patient Instructions (Addendum)
The chemotherapy medication bag should finish at 46 hours, 96 hours, or 7 days. For example, if your pump is scheduled for 46 hours and it was put on at 4:00 p.m., it should finish at 2:00 p.m. the day it is scheduled to come off regardless of your appointment time.     Estimated time to finish at 1130am on Wednesday 09/27/22.   If the display on your pump reads "Low Volume" and it is beeping, take the batteries out of the pump and come to the cancer center for it to be taken off.   If the pump alarms go off prior to the pump reading "Low Volume" then call [phone] and someone can assist you.  If the plunger comes out and the chemotherapy medication is leaking out, please use your home chemo spill kit to clean up the spill. Do NOT use paper towels or other household products.  If you have problems or questions regarding your pump, please call either [phone] (24 hours a day) or the cancer center Monday-Friday 8:00 a.m.- 4:30 p.m. at the clinic number and we will assist you. If you are unable to get assistance, then go to the nearest Emergency Department and ask the staff to contact the IV team for assistance.   [facility]  Discharge Instructions: Thank you for choosing [facility] to provide your oncology and hematology care.   If you have a lab appointment with the [facility], please go directly to the [facility] and check in at the registration area.   Wear comfortable clothing and clothing appropriate for easy access to any Portacath or PICC line.   We strive to give you quality time with your provider. You may need to reschedule your appointment if you arrive late (15 or more minutes).  Arriving late affects you and other patients whose appointments are after yours.  Also, if you miss three or more appointments without notifying the office, you may be dismissed from the clinic at the provider's discretion.      For  prescription refill requests, have your pharmacy contact our office and allow 72 hours for refills to be completed.    Today you received the following chemotherapy and/or immunotherapy agents: oxaliplatin, leucovorin, fluorouracil      To help prevent nausea and vomiting after your treatment, we encourage you to take your nausea medication as directed.  BELOW ARE SYMPTOMS THAT SHOULD BE REPORTED IMMEDIATELY: *FEVER GREATER THAN 100.4 F (38 C) OR HIGHER *CHILLS OR SWEATING *NAUSEA AND VOMITING THAT IS NOT CONTROLLED WITH YOUR NAUSEA MEDICATION *UNUSUAL SHORTNESS OF BREATH *UNUSUAL BRUISING OR BLEEDING *URINARY PROBLEMS (pain or burning when urinating, or frequent urination) *BOWEL PROBLEMS (unusual diarrhea, constipation, pain near the anus) TENDERNESS IN MOUTH AND THROAT WITH OR WITHOUT PRESENCE OF ULCERS (sore throat, sores in mouth, or a toothache) UNUSUAL RASH, SWELLING OR PAIN  UNUSUAL VAGINAL DISCHARGE OR ITCHING   Items with * indicate a potential emergency and should be followed up as soon as possible or go to the Emergency Department if any problems should occur.  Please show the CHEMOTHERAPY ALERT CARD or IMMUNOTHERAPY ALERT CARD at check-in to the Emergency Department and triage nurse.  Should you have questions after your visit or need to cancel or reschedule your appointment, please contact [facility]  Dept: [phone]  and follow the prompts.  Office hours are 8:00 a.m. to 4:30 p.m. Monday - Friday. Please note that  voicemails left after 4:00 p.m. may not be returned until the following business day.  We are closed weekends and major holidays. You have access to a nurse at all times for urgent questions. Please call the main number to the clinic Dept: [phone] and follow the prompts.   For any non-urgent questions, you may also contact your provider using MyChart. We now offer e-Visits for anyone 41 and older to request care online  for non-urgent symptoms. For details visit [email or web address].   Also download the MyChart app! Go to the app store, search "MyChart", open the app, select [facility], and log in with your MyChart username and password.  Oxaliplatin Injection What is this medication? OXALIPLATIN (ox AL i PLA tin) treats some types of cancer. It works by slowing down the growth of cancer cells. This medicine may be used for other purposes; ask your health care provider or pharmacist if you have questions. COMMON BRAND NAME(S): Eloxatin What should I tell my care team before I take this medication? They need to know if you have any of these conditions: Heart disease History of irregular heartbeat or rhythm Liver disease Low blood cell levels (white cells, red cells, and platelets) Lung or breathing disease, such as asthma Take medications that treat or prevent blood clots Tingling of the fingers, toes, or other nerve disorder An unusual or allergic reaction to oxaliplatin, other medications, foods, dyes, or preservatives If you or your partner are pregnant or trying to get pregnant Breast-feeding How should I use this medication? This medication is injected into a vein. It is given by your care team in a hospital or clinic setting. Talk to your care team about the use of this medication in children. Special care may be needed. Overdosage: If you think you have taken too much of this medicine contact a poison control center or emergency room at once. NOTE: This medicine is only for you. Do not share this medicine with others. What if I miss a dose? Keep appointments for follow-up doses. It is important not to miss a dose. Call your care team if you are unable to keep an appointment. What may interact with this medication? Do not take this medication with any of the following: Cisapride Dronedarone Pimozide Thioridazine This medication may also interact with the following: Aspirin and aspirin-like  medications Certain medications that treat or prevent blood clots, such as warfarin, apixaban, dabigatran, and rivaroxaban Cisplatin Cyclosporine Diuretics Medications for infection, such as acyclovir, adefovir, amphotericin B, bacitracin, cidofovir, foscarnet, ganciclovir, gentamicin, pentamidine, vancomycin NSAIDs, medications for pain and inflammation, such as ibuprofen or naproxen Other medications that cause heart rhythm changes Pamidronate Zoledronic acid This list may not describe all possible interactions. Give your health care provider a list of all the medicines, herbs, non-prescription drugs, or dietary supplements you use. Also tell them if you smoke, drink alcohol, or use illegal drugs. Some items may interact with your medicine. What should I watch for while using this medication? Your condition will be monitored carefully while you are receiving this medication. You may need blood work while taking this medication. This medication may make you feel generally unwell. This is not uncommon as chemotherapy can affect healthy cells as well as cancer cells. Report any side effects. Continue your course of treatment even though you feel ill unless your care team tells you to stop. This medication may increase your risk of getting an infection. Call your care team for advice if you get a fever, chills, sore throat,  or other symptoms of a cold or flu. Do not treat yourself. Try to avoid being around people who are sick. Avoid taking medications that contain aspirin, acetaminophen, ibuprofen, naproxen, or ketoprofen unless instructed by your care team. These medications may hide a fever. Be careful brushing or flossing your teeth or using a toothpick because you may get an infection or bleed more easily. If you have any dental work done, tell your dentist you are receiving this medication. This medication can make you more sensitive to cold. Do not drink cold drinks or use ice. Cover exposed  skin before coming in contact with cold temperatures or cold objects. When out in cold weather wear warm clothing and cover your mouth and nose to warm the air that goes into your lungs. Tell your care team if you get sensitive to the cold. Talk to your care team if you or your partner are pregnant or think either of you might be pregnant. This medication can cause serious birth defects if taken during pregnancy and for 9 months after the last dose. A negative pregnancy test is required before starting this medication. A reliable form of contraception is recommended while taking this medication and for 9 months after the last dose. Talk to your care team about effective forms of contraception. Do not father a child while taking this medication and for 6 months after the last dose. Use a condom while having sex during this time period. Do not breastfeed while taking this medication and for 3 months after the last dose. This medication may cause infertility. Talk to your care team if you are concerned about your fertility. What side effects may I notice from receiving this medication? Side effects that you should report to your care team as soon as possible: Allergic reactions--skin rash, itching, hives, swelling of the face, lips, tongue, or throat Bleeding--bloody or black, tar-like stools, vomiting blood or brown material that looks like coffee grounds, red or dark brown urine, small red or purple spots on skin, unusual bruising or bleeding Dry cough, shortness of breath or trouble breathing Heart rhythm changes--fast or irregular heartbeat, dizziness, feeling faint or lightheaded, chest pain, trouble breathing Infection--fever, chills, cough, sore throat, wounds that don't heal, pain or trouble when passing urine, general feeling of discomfort or being unwell Liver injury--right upper belly pain, loss of appetite, nausea, light-colored stool, dark yellow or brown urine, yellowing skin or eyes, unusual  weakness or fatigue Low red blood cell level--unusual weakness or fatigue, dizziness, headache, trouble breathing Muscle injury--unusual weakness or fatigue, muscle pain, dark yellow or brown urine, decrease in amount of urine Pain, tingling, or numbness in the hands or feet Sudden and severe headache, confusion, change in vision, seizures, which may be signs of posterior reversible encephalopathy syndrome (PRES) Unusual bruising or bleeding Side effects that usually do not require medical attention (report to your care team if they continue or are bothersome): Diarrhea Nausea Pain, redness, or swelling with sores inside the mouth or throat Unusual weakness or fatigue Vomiting This list may not describe all possible side effects. Call your doctor for medical advice about side effects. You may report side effects to FDA at 1-800-FDA-1088. Where should I keep my medication? This medication is given in a hospital or clinic. It will not be stored at home. NOTE: This sheet is a summary. It may not cover all possible information. If you have questions about this medicine, talk to your doctor, pharmacist, or health care provider.  2023 Elsevier/Gold  Standard (2007-10-05 00:00:00) Leucovorin Injection What is this medication? LEUCOVORIN (loo koe VOR in) prevents side effects from certain medications, such as methotrexate. It works by increasing folate levels. This helps protect healthy cells in your body. It may also be used to treat anemia caused by low levels of folate. It can also be used with fluorouracil, a type of chemotherapy, to treat colorectal cancer. It works by increasing the effects of fluorouracil in the body. This medicine may be used for other purposes; ask your health care provider or pharmacist if you have questions. What should I tell my care team before I take this medication? They need to know if you have any of these conditions: Anemia from low levels of vitamin B12 in the  blood An unusual or allergic reaction to leucovorin, folic acid, other medications, foods, dyes, or preservatives Pregnant or trying to get pregnant Breastfeeding How should I use this medication? This medication is injected into a vein or a muscle. It is given by your care team in a hospital or clinic setting. Talk to your care team about the use of this medication in children. Special care may be needed. Overdosage: If you think you have taken too much of this medicine contact a poison control center or emergency room at once. NOTE: This medicine is only for you. Do not share this medicine with others. What if I miss a dose? Keep appointments for follow-up doses. It is important not to miss your dose. Call your care team if you are unable to keep an appointment. What may interact with this medication? Capecitabine Fluorouracil Phenobarbital Phenytoin Primidone Trimethoprim;sulfamethoxazole This list may not describe all possible interactions. Give your health care provider a list of all the medicines, herbs, non-prescription drugs, or dietary supplements you use. Also tell them if you smoke, drink alcohol, or use illegal drugs. Some items may interact with your medicine. What should I watch for while using this medication? Your condition will be monitored carefully while you are receiving this medication. This medication may increase the side effects of 5-fluorouracil. Tell your care team if you have diarrhea or mouth sores that do not get better or that get worse. What side effects may I notice from receiving this medication? Side effects that you should report to your care team as soon as possible: Allergic reactions--skin rash, itching, hives, swelling of the face, lips, tongue, or throat This list may not describe all possible side effects. Call your doctor for medical advice about side effects. You may report side effects to FDA at 1-800-FDA-1088. Where should I keep my  medication? This medication is given in a hospital or clinic. It will not be stored at home. NOTE: This sheet is a summary. It may not cover all possible information. If you have questions about this medicine, talk to your doctor, pharmacist, or health care provider.  2023 Elsevier/Gold Standard (2022-01-17 00:00:00) Fluorouracil Injection What is this medication? FLUOROURACIL (flure oh YOOR a sil) treats some types of cancer. It works by slowing down the growth of cancer cells. This medicine may be used for other purposes; ask your health care provider or pharmacist if you have questions. COMMON BRAND NAME(S): Adrucil What should I tell my care team before I take this medication? They need to know if you have any of these conditions: Blood disorders Dihydropyrimidine dehydrogenase (DPD) deficiency Infection, such as chickenpox, cold sores, herpes Kidney disease Liver disease Poor nutrition Recent or ongoing radiation therapy An unusual or allergic reaction to fluorouracil,  other medications, foods, dyes, or preservatives If you or your partner are pregnant or trying to get pregnant Breast-feeding How should I use this medication? This medication is injected into a vein. It is administered by your care team in a hospital or clinic setting. Talk to your care team about the use of this medication in children. Special care may be needed. Overdosage: If you think you have taken too much of this medicine contact a poison control center or emergency room at once. NOTE: This medicine is only for you. Do not share this medicine with others. What if I miss a dose? Keep appointments for follow-up doses. It is important not to miss your dose. Call your care team if you are unable to keep an appointment. What may interact with this medication? Do not take this medication with any of the following: Live virus vaccines This medication may also interact with the following: Medications that treat or  prevent blood clots, such as warfarin, enoxaparin, dalteparin This list may not describe all possible interactions. Give your health care provider a list of all the medicines, herbs, non-prescription drugs, or dietary supplements you use. Also tell them if you smoke, drink alcohol, or use illegal drugs. Some items may interact with your medicine. What should I watch for while using this medication? Your condition will be monitored carefully while you are receiving this medication. This medication may make you feel generally unwell. This is not uncommon as chemotherapy can affect healthy cells as well as cancer cells. Report any side effects. Continue your course of treatment even though you feel ill unless your care team tells you to stop. In some cases, you may be given additional medications to help with side effects. Follow all directions for their use. This medication may increase your risk of getting an infection. Call your care team for advice if you get a fever, chills, sore throat, or other symptoms of a cold or flu. Do not treat yourself. Try to avoid being around people who are sick. This medication may increase your risk to bruise or bleed. Call your care team if you notice any unusual bleeding. Be careful brushing or flossing your teeth or using a toothpick because you may get an infection or bleed more easily. If you have any dental work done, tell your dentist you are receiving this medication. Avoid taking medications that contain aspirin, acetaminophen, ibuprofen, naproxen, or ketoprofen unless instructed by your care team. These medications may hide a fever. Do not treat diarrhea with over the counter products. Contact your care team if you have diarrhea that lasts more than 2 days or if it is severe and watery. This medication can make you more sensitive to the sun. Keep out of the sun. If you cannot avoid being in the sun, wear protective clothing and sunscreen. Do not use sun lamps,  tanning beds, or tanning booths. Talk to your care team if you or your partner wish to become pregnant or think you might be pregnant. This medication can cause serious birth defects if taken during pregnancy and for 3 months after the last dose. A reliable form of contraception is recommended while taking this medication and for 3 months after the last dose. Talk to your care team about effective forms of contraception. Do not father a child while taking this medication and for 3 months after the last dose. Use a condom while having sex during this time period. Do not breastfeed while taking this medication. This medication may  cause infertility. Talk to your care team if you are concerned about your fertility. What side effects may I notice from receiving this medication? Side effects that you should report to your care team as soon as possible: Allergic reactions--skin rash, itching, hives, swelling of the face, lips, tongue, or throat Heart attack--pain or tightness in the chest, shoulders, arms, or jaw, nausea, shortness of breath, cold or clammy skin, feeling faint or lightheaded Heart failure--shortness of breath, swelling of the ankles, feet, or hands, sudden weight gain, unusual weakness or fatigue Heart rhythm changes--fast or irregular heartbeat, dizziness, feeling faint or lightheaded, chest pain, trouble breathing High ammonia level--unusual weakness or fatigue, confusion, loss of appetite, nausea, vomiting, seizures Infection--fever, chills, cough, sore throat, wounds that don't heal, pain or trouble when passing urine, general feeling of discomfort or being unwell Low red blood cell level--unusual weakness or fatigue, dizziness, headache, trouble breathing Pain, tingling, or numbness in the hands or feet, muscle weakness, change in vision, confusion or trouble speaking, loss of balance or coordination, trouble walking, seizures Redness, swelling, and blistering of the skin over hands and  feet Severe or prolonged diarrhea Unusual bruising or bleeding Side effects that usually do not require medical attention (report to your care team if they continue or are bothersome): Dry skin Headache Increased tears Nausea Pain, redness, or swelling with sores inside the mouth or throat Sensitivity to light Vomiting This list may not describe all possible side effects. Call your doctor for medical advice about side effects. You may report side effects to FDA at 1-800-FDA-1088. Where should I keep my medication? This medication is given in a hospital or clinic. It will not be stored at home. NOTE: This sheet is a summary. It may not cover all possible information. If you have questions about this medicine, talk to your doctor, pharmacist, or health care provider.  2023 Elsevier/Gold Standard (2021-12-13 00:00:00)

## 2022-09-25 NOTE — Progress Notes (Signed)
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

## 2022-09-26 ENCOUNTER — Telehealth: Payer: Self-pay

## 2022-09-26 ENCOUNTER — Telehealth: Payer: Self-pay | Admitting: *Deleted

## 2022-09-26 ENCOUNTER — Other Ambulatory Visit: Payer: Self-pay

## 2022-09-26 ENCOUNTER — Encounter: Payer: Self-pay | Admitting: Oncology

## 2022-09-26 NOTE — Telephone Encounter (Signed)
Called patient for first time chemo follow-up.  Patient stated that he did have some mild nausea that subsided, had some redness in his hands for a brief period yesterday that did subside.  Patient states he has otherwise been tolerating the treatment ok with no issues to report with the chemo pump.  Reminded patient to continue to drink plenty of water and to take his nausea home meds if needed.  Reminded patient of pump stop time at 11:30 am on 09/27/22.  Patient verbalized understanding.  All questions were answered during phone call.

## 2022-09-26 NOTE — Telephone Encounter (Signed)
Patient left VM regarding request to discuss some symptoms he is having thinking he may be having a reaction. Called [name] and he reports last night he had the following symptoms: Hands got red L > R Felt hot, clamy Runny nose Felt shaky Informed him that these symptoms most likely could have been related to the IV Decadron he received and are not worrisome-should self-resolve. Reminded him to eat low concentrated sweets and keep journal of how his blood sugars run. Call if > 250.

## 2022-09-27 ENCOUNTER — Encounter: Payer: Self-pay | Admitting: Oncology

## 2022-09-27 ENCOUNTER — Telehealth: Payer: Self-pay | Admitting: *Deleted

## 2022-09-27 ENCOUNTER — Inpatient Hospital Stay: Payer: Commercial Managed Care - HMO

## 2022-09-27 ENCOUNTER — Other Ambulatory Visit: Payer: Self-pay | Admitting: Nurse Practitioner

## 2022-09-27 ENCOUNTER — Telehealth: Payer: Self-pay

## 2022-09-27 VITALS — BP 124/73 | HR 87 | Temp 98.3°F | Resp 18

## 2022-09-27 DIAGNOSIS — C189 Malignant neoplasm of colon, unspecified: Secondary | ICD-10-CM

## 2022-09-27 DIAGNOSIS — Z5111 Encounter for antineoplastic chemotherapy: Secondary | ICD-10-CM | POA: Diagnosis not present

## 2022-09-27 MED ORDER — FLUCONAZOLE 100 MG PO TABS: 100.0000 mg | ORAL_TABLET | Freq: Every day | ORAL | 0 refills | Status: AC

## 2022-09-27 MED ORDER — HEPARIN SOD (PORK) LOCK FLUSH 100 UNIT/ML IV SOLN
500.0000 [IU] | Freq: Once | INTRAVENOUS | Status: AC | PRN
  Administered 2022-09-27: 500 [IU]

## 2022-09-27 MED ORDER — SODIUM CHLORIDE 0.9% FLUSH
10.0000 mL | INTRAVENOUS | Status: DC | PRN
  Administered 2022-09-27: 10 mL

## 2022-09-27 NOTE — Patient Instructions (Signed)

## 2022-09-27 NOTE — Telephone Encounter (Signed)
Spoke with patient on 01/26, the day after his port-a-cath placement on 01/25, to see how the patient was feeling. The patient stated that he was having "a little soreness as expected." But other than that is doing great. Pt. Does not report any signs of infection. Pt. Instructed to call back with any problems or concerns. Pt. Verbalized understanding.

## 2022-09-27 NOTE — Telephone Encounter (Signed)
Faxed CMP glucose reading for 09/25/22 and 08/16/22 with message that his reported blood sugar last night was 336 and that December reading predates chemotherapy. Dr. Benay Spice requesting PCP managed his glucose. Also called office and reported readings. Dr. Moreen Fowler not in office, but will be reported to Dr. Harlan Stains today (on call).

## 2022-09-28 ENCOUNTER — Encounter: Payer: Self-pay | Admitting: Oncology

## 2022-09-28 LAB — VITAMIN B1: Vitamin B1 (Thiamine): 165 nmol/L (ref 66.5–200.0)

## 2022-09-28 NOTE — Progress Notes (Signed)
Paperwork for [employer] faxed with confirmation received.    Fax # [phone]

## 2022-09-29 ENCOUNTER — Encounter: Payer: Self-pay | Admitting: Oncology

## 2022-09-29 ENCOUNTER — Other Ambulatory Visit: Payer: Self-pay | Admitting: *Deleted

## 2022-09-29 MED ORDER — MAGIC MOUTHWASH: 5.0000 mL | Freq: Four times a day (QID) | ORAL | 1 refills | Status: DC | PRN

## 2022-09-30 LAB — VITAMIN B6: Vitamin B6: 3.9 ug/L (ref 3.4–65.2)

## 2022-10-02 ENCOUNTER — Encounter: Payer: Self-pay | Admitting: Oncology

## 2022-10-03 ENCOUNTER — Inpatient Hospital Stay: Payer: Medicaid Other | Admitting: Licensed Clinical Social Worker

## 2022-10-03 ENCOUNTER — Encounter: Payer: Self-pay | Admitting: *Deleted

## 2022-10-03 ENCOUNTER — Inpatient Hospital Stay: Payer: Medicaid Other | Attending: Oncology | Admitting: Oncology

## 2022-10-03 VITALS — BP 126/71 | HR 82 | Temp 98.1°F | Resp 18 | Ht 66.0 in | Wt 237.0 lb

## 2022-10-03 DIAGNOSIS — E785 Hyperlipidemia, unspecified: Secondary | ICD-10-CM | POA: Diagnosis not present

## 2022-10-03 DIAGNOSIS — F32A Depression, unspecified: Secondary | ICD-10-CM | POA: Insufficient documentation

## 2022-10-03 DIAGNOSIS — C182 Malignant neoplasm of ascending colon: Secondary | ICD-10-CM | POA: Diagnosis present

## 2022-10-03 DIAGNOSIS — C787 Secondary malignant neoplasm of liver and intrahepatic bile duct: Secondary | ICD-10-CM

## 2022-10-03 DIAGNOSIS — I10 Essential (primary) hypertension: Secondary | ICD-10-CM | POA: Diagnosis not present

## 2022-10-03 DIAGNOSIS — Z5111 Encounter for antineoplastic chemotherapy: Secondary | ICD-10-CM | POA: Diagnosis present

## 2022-10-03 DIAGNOSIS — C189 Malignant neoplasm of colon, unspecified: Secondary | ICD-10-CM

## 2022-10-03 DIAGNOSIS — D509 Iron deficiency anemia, unspecified: Secondary | ICD-10-CM | POA: Diagnosis present

## 2022-10-03 DIAGNOSIS — K219 Gastro-esophageal reflux disease without esophagitis: Secondary | ICD-10-CM | POA: Diagnosis not present

## 2022-10-03 DIAGNOSIS — Z79899 Other long term (current) drug therapy: Secondary | ICD-10-CM | POA: Diagnosis not present

## 2022-10-03 DIAGNOSIS — F410 Panic disorder [episodic paroxysmal anxiety] without agoraphobia: Secondary | ICD-10-CM | POA: Diagnosis not present

## 2022-10-03 DIAGNOSIS — R599 Enlarged lymph nodes, unspecified: Secondary | ICD-10-CM | POA: Insufficient documentation

## 2022-10-03 DIAGNOSIS — K769 Liver disease, unspecified: Secondary | ICD-10-CM | POA: Insufficient documentation

## 2022-10-03 DIAGNOSIS — Z800 Family history of malignant neoplasm of digestive organs: Secondary | ICD-10-CM | POA: Insufficient documentation

## 2022-10-03 MED ORDER — HYDROCODONE-ACETAMINOPHEN 5-325 MG PO TABS: 1.0000 | ORAL_TABLET | Freq: Two times a day (BID) | ORAL | 0 refills | Status: DC | PRN

## 2022-10-03 NOTE — Progress Notes (Signed)
[place] OFFICE PROGRESS NOTE   Diagnosis: Colon cancer  INTERVAL HISTORY:   Anthony Calhoun completed a cycle of FOLFOX on 09/25/2022.  No nausea/vomiting.  He reports developing mouth soreness beginning on day 4 and 5.  The mouth sore has progressed.  He has difficulty eating and drinking due to mouth pain.  Magic mouthwash and Orajel helped partially.  He completed a course of Diflucan.  No cold sensitivity or neuropathy symptoms.  He reports "soreness "of the neck glands.  Objective:  Vital signs in last 24 hours:  Blood pressure 126/71, pulse 82, temperature 98.1 F (36.7 C), temperature source Oral, resp. rate 18, height '5\' 6"'$  (1.676 m), weight 237 lb (107.5 kg), SpO2 98 %.    HEENT: Ulcers at the left buccal mucosa, right upper inner lip, and tongue, no thrush.  Area of neck tenderness is at the submandibular glands on the right greater than left. Resp: Lungs clear bilaterally Cardio: Regular rate and rhythm GI: Soft, no hepatosplenomegaly, mild tenderness in the right subcostal region Vascular: No leg edema  Skin: Palms without erythema  Portacath/PICC-without erythema  Lab Results:  Lab Results  Component Value Date   WBC 5.0 09/25/2022   HGB 13.5 09/25/2022   HCT 41.8 09/25/2022   MCV 76.6 (L) 09/25/2022   PLT 212 09/25/2022   NEUTROABS 3.4 09/25/2022    CMP  Lab Results  Component Value Date   NA 135 09/25/2022   K 4.1 09/25/2022   CL 100 09/25/2022   CO2 28 09/25/2022   GLUCOSE 288 (H) 09/25/2022   BUN 10 09/25/2022   CREATININE 0.75 09/25/2022   CALCIUM 9.5 09/25/2022   PROT 8.0 09/25/2022   ALBUMIN 4.2 09/25/2022   AST 31 09/25/2022   ALT 40 09/25/2022   ALKPHOS 174 (H) 09/25/2022   BILITOT 0.7 09/25/2022   GFRNONAA >60 09/25/2022   GFRAA >60 05/10/2020    Lab Results  Component Value Date   CEA1 1.08 03/07/2021   CEA 120.80 (H) 09/25/2022    Medications: I have reviewed the patient's current  medications.   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 secondary to nausea (patient discontinued Xeloda on day 11 due to colonoscopy) Colonoscopy 07/09/2020-nodular mucosa at the colonic anastomosis, biopsied; patent end-to-side ileocolonic anastomosis characterized by healthy-appearing mucosa and visible sutures; nonbleeding internal hemorrhoids, biopsy with benign ulcerated anastomotic mucosa with granulation tissue Cycle 4 adjuvant Xeloda 07/19/2020, 2000 mg every morning and 1500 mg every afternoon Cycle 5 adjuvant Xeloda 08/09/2020 Cycle 6 adjuvant Xeloda 08/31/2019 Cycle 7 adjuvant Xeloda 09/20/2020 Cycle 8 adjuvant Xeloda 10/11/2020 CTs 03/07/2021-no evidence of recurrent disease, hepatic steatosis, slight wall thickening at the junction of the descending and horizontal duodenum Mild elevation of CEA 2023 12/01/2021-Guardant Reveal-ct DNA detected PET 12/30/2021-hypermetabolic right liver lesion, hypermetabolic area in a loop of mid small bowel with an SUV of 12.5, review of PET images at GI tumor conference 01/11/2022-small bowel uptake felt to be a benign finding MRI liver 01/10/2022-solitary 1.2 cm right liver lesion between segments 7 and 8 compatible with metastasis, subtle changes suggesting cirrhosis, hepatic steatosis  CT enteroscopy 02/02/2022-small bowel loop with hypermetabolic activity on PET continues to have a bandlike density along the margin felt to represent a diverticulum or adjacent nodal tissue.   Small bowel tumor not excluded.  3-4 mm left omental nodule 02/08/2022-biopsy and ablation of solitary liver lesion, adenocarcinoma consistent with metastatic colorectal adenocarcinoma CTs 03/31/2022-unchanged circumstantial soft tissue thickening surrounding a loop of mid to distal small bowel with an adjacent nodular focus measuring 1.2 cm.,  Ablation cavity in segment 7, no other evidence of metastatic disease Diagnostic laparoscopy 05/26/2022-mid small bowel mass-resected, well to moderately differentiated adenocarcinoma, 0/2 lymph nodes, morphology consistent with a colon primary, tumor involved full-thickness including serosa with perineural and large vessel involvement; foundation 1-microsatellite stable, tumor mutation burden 2, K-ras Q61H CT abdomen/pelvis 07/10/2022-the right liver ablation defect is smaller, new subtle right liver lesion measuring 13 x 9 mm, enlarged lymph node in the right upper quadrant mesentery adjacent to surgical anastomosis PET 07/26/2022-new segment 6 liver lesion, enlarging hypermetabolic left omental lesion, there is another mildly hypermetabolic peritoneal implant, 7 mm omental nodule at the midline without hypermetabolism Cycle 1 FOLFOX 09/25/2022 Microcytic anemia secondary to #1-improved Colon polyps-sessile serrated adenoma of the descending colon, tubular adenomas with focal high-grade dysplasia of the transverse colon on colonoscopy 02/12/2020, tubular adenoma of the mid ascending colon on the surgical specimen 04/12/2020 Family history of colon cancer Depression Anxiety/panic attacks Hypertension Hyperlipidemia Nausea secondary to Xeloda?-Resolved Gastroesophageal reflux disease-improved following discontinuation of Xeloda Anemia, microcytic; ferritin 6 01/24/2022; stool positive for blood x3 01/25/2022; 02/02/2022 CT abdomen/pelvis enterography-bowel loop demonstrating hypermetabolic activity continues to have a bandlike density along its margin possibly  representing a diverticulum or adjacent nodal tissue, difficult to exclude small bowel tumor, 3 x 4 mm left omental nodule or lymph node, known right hepatic lobe tumor is occult on CT. Venofer weekly x3 beginning 03/03/2022 Negative capsule endoscopy 03/09/2022 Venofer 03/03/2022 for 3 weekly doses Venofer 05/03/2022, 05/19/2022 Venofer 07/28/2022, 08/04/2022 12.  Anterior left neck nodule 03/17/2022-cyst?,  Lymph node? 13.  Mucositis secondary to chemotherapy at office visit 10/03/2022-the 5-FU will be dose reduced with cycle 2 FOLFOX       Disposition: Mr Dietz has metastatic colon cancer.  He completed cycle 1 FOLFOX on 09/25/2022.  He has developed oral mucositis from chemotherapy.  He does not have cold sensitivity.  He will continue to use Orajel and Magic mouthwash.  I prescribed hydrocodone to use as needed for pain.  He will try cold liquids today.  He will call if he is unable to maintain his hydration.  The 5-FU will be dose reduced with cycle 2 FOLFOX next week.  Betsy Coder, MD  10/03/2022  9:34 AM

## 2022-10-03 NOTE — Progress Notes (Signed)
Faxed clearance for necessary dental work to Dr. Benedict Needy at [phone].

## 2022-10-03 NOTE — Progress Notes (Signed)
[place] CSW Counseling Note  Patient was referred by nurse. Treatment type: Couples  Presenting Concerns: Patient and/or family reports the following symptoms/concerns: anxiety, depression, and Obsessive Compulsive Disorder Duration of problem: several years; Severity of problem: moderate   Orientation:oriented to person, place, time/date, situation, day of week, month of year, and year.   Affect: Appropriate Risk of harm to self or others: No plan to harm self or others  Patient and/or Family's Strengths/Protective Factors: Social connections and Concrete supports in place (healthy food, safe environments, etc.)Capable of independent living  Marketing executive fund of knowledge  Motivation for treatment/growth  Supportive family/friends      Goals Addressed: Patient will:  Reduce symptoms of: anxiety, depression, and obsessions Increase knowledge and/or ability of: coping skills and self-management skills  Increase healthy adjustment to current life circumstances   Progress towards Goals: Progressing   Interventions: Interventions utilized:  Solution Focused, Strength-based, Supportive, and Meditation: Encouraged patient to continue playing and writing Calhoun.       Assessment: Patient currently experiencing increased anxiety, depression and OCD.  Patient and his girlfriend, Anthony Calhoun, met with CSW after meeting with Dr. Benay Calhoun.  He said he is obsessing about his cancer and chemotherapy side effects.  Anthony Calhoun confirmed.  Explored his current tools.  Encouraged him to use self-talk when he becomes overwhelmed and engage in self-care behavior. He is concerned about his weight-loss this week and stated he is meeting with the dietician next week.    Plan: Follow up with CSW: Per patient's request. Behavioral recommendations: Anthony Calhoun, be pro-active before the weekend to anticipate needs. Referral(s): Individual psychiatrist.  CSW encouraged patient to consult the  medical provider that is prescribing his psychotropic medications to assess effectiveness.  He is apprehensive about changing his medications, but stated his Zoloft and Xanax are minimally managing his symptoms.       Anthony Pickle Anthony Mamaril, LCSW   Patient is participating in a Managed Medicaid Plan:  Yes

## 2022-10-07 ENCOUNTER — Other Ambulatory Visit: Payer: Self-pay | Admitting: Oncology

## 2022-10-09 ENCOUNTER — Other Ambulatory Visit: Payer: Self-pay | Admitting: *Deleted

## 2022-10-09 ENCOUNTER — Inpatient Hospital Stay: Payer: Medicaid Other

## 2022-10-09 ENCOUNTER — Inpatient Hospital Stay: Payer: Medicaid Other | Admitting: Oncology

## 2022-10-09 ENCOUNTER — Encounter: Payer: Self-pay | Admitting: *Deleted

## 2022-10-09 VITALS — BP 113/69 | HR 80 | Temp 98.1°F | Resp 18 | Ht 66.0 in | Wt 243.0 lb

## 2022-10-09 VITALS — BP 112/68 | HR 72

## 2022-10-09 DIAGNOSIS — Z5111 Encounter for antineoplastic chemotherapy: Secondary | ICD-10-CM | POA: Diagnosis not present

## 2022-10-09 DIAGNOSIS — C189 Malignant neoplasm of colon, unspecified: Secondary | ICD-10-CM

## 2022-10-09 DIAGNOSIS — C787 Secondary malignant neoplasm of liver and intrahepatic bile duct: Secondary | ICD-10-CM | POA: Diagnosis not present

## 2022-10-09 LAB — CBC WITH DIFFERENTIAL (CANCER CENTER ONLY)
Abs Immature Granulocytes: 0 10*3/uL (ref 0.00–0.07)
Basophils Absolute: 0 10*3/uL (ref 0.0–0.1)
Basophils Relative: 1 %
Eosinophils Absolute: 0.2 10*3/uL (ref 0.0–0.5)
Eosinophils Relative: 4 %
HCT: 39.2 % (ref 39.0–52.0)
Hemoglobin: 12.8 g/dL — ABNORMAL LOW (ref 13.0–17.0)
Immature Granulocytes: 0 %
Lymphocytes Relative: 25 %
Lymphs Abs: 1 10*3/uL (ref 0.7–4.0)
MCH: 25 pg — ABNORMAL LOW (ref 26.0–34.0)
MCHC: 32.7 g/dL (ref 30.0–36.0)
MCV: 76.7 fL — ABNORMAL LOW (ref 80.0–100.0)
Monocytes Absolute: 0.4 10*3/uL (ref 0.1–1.0)
Monocytes Relative: 10 %
Neutro Abs: 2.3 10*3/uL (ref 1.7–7.7)
Neutrophils Relative %: 60 %
Platelet Count: 151 10*3/uL (ref 150–400)
RBC: 5.11 MIL/uL (ref 4.22–5.81)
RDW: 16.4 % — ABNORMAL HIGH (ref 11.5–15.5)
WBC Count: 3.9 10*3/uL — ABNORMAL LOW (ref 4.0–10.5)
nRBC: 0 % (ref 0.0–0.2)

## 2022-10-09 LAB — CMP (CANCER CENTER ONLY)
ALT: 40 U/L (ref 0–44)
AST: 26 U/L (ref 15–41)
Albumin: 3.9 g/dL (ref 3.5–5.0)
Alkaline Phosphatase: 185 U/L — ABNORMAL HIGH (ref 38–126)
Anion gap: 8 (ref 5–15)
BUN: 11 mg/dL (ref 6–20)
CO2: 27 mmol/L (ref 22–32)
Calcium: 9 mg/dL (ref 8.9–10.3)
Chloride: 102 mmol/L (ref 98–111)
Creatinine: 0.68 mg/dL (ref 0.61–1.24)
GFR, Estimated: >60 mL/min (ref >60)
Glucose, Bld: 216 mg/dL — ABNORMAL HIGH (ref 70–99)
Potassium: 3.8 mmol/L (ref 3.5–5.1)
Sodium: 137 mmol/L (ref 135–145)
Total Bilirubin: 0.6 mg/dL (ref 0.3–1.2)
Total Protein: 7.2 g/dL (ref 6.5–8.1)

## 2022-10-09 MED ORDER — PALONOSETRON HCL INJECTION 0.25 MG/5ML
0.2500 mg | Freq: Once | INTRAVENOUS | Status: AC
  Administered 2022-10-09: 0.25 mg via INTRAVENOUS
  Filled 2022-10-09: qty 5

## 2022-10-09 MED ORDER — SODIUM CHLORIDE 0.9 % IV SOLN
10.0000 mg | Freq: Once | INTRAVENOUS | Status: AC
  Administered 2022-10-09: 10 mg via INTRAVENOUS
  Filled 2022-10-09: qty 10

## 2022-10-09 MED ORDER — FLUOROURACIL CHEMO INJECTION 500 MG/10ML
200.0000 mg/m2 | Freq: Once | INTRAVENOUS | Status: AC
  Administered 2022-10-09: 450 mg via INTRAVENOUS
  Filled 2022-10-09: qty 9

## 2022-10-09 MED ORDER — DEXTROSE 5 % IV SOLN: Freq: Once | INTRAVENOUS | Status: AC

## 2022-10-09 MED ORDER — SODIUM CHLORIDE 0.9 % IV SOLN: Freq: Once | INTRAVENOUS | Status: AC

## 2022-10-09 MED ORDER — LEUCOVORIN CALCIUM INJECTION 350 MG
200.0000 mg/m2 | Freq: Once | INTRAVENOUS | Status: AC
  Administered 2022-10-09: 448 mg via INTRAVENOUS
  Filled 2022-10-09: qty 22.4

## 2022-10-09 MED ORDER — OXALIPLATIN CHEMO INJECTION 100 MG/20ML
85.0000 mg/m2 | Freq: Once | INTRAVENOUS | Status: AC
  Administered 2022-10-09: 200 mg via INTRAVENOUS
  Filled 2022-10-09: qty 40

## 2022-10-09 MED ORDER — SODIUM CHLORIDE 0.9 % IV SOLN
5.0000 mg/kg | Freq: Once | INTRAVENOUS | Status: AC
  Administered 2022-10-09: 500 mg via INTRAVENOUS
  Filled 2022-10-09: qty 16

## 2022-10-09 MED ORDER — SODIUM CHLORIDE 0.9 % IV SOLN
1600.0000 mg/m2 | INTRAVENOUS | Status: DC
  Administered 2022-10-09: 3500 mg via INTRAVENOUS
  Filled 2022-10-09: qty 70

## 2022-10-09 NOTE — Progress Notes (Signed)
[place] OFFICE PROGRESS NOTE   Diagnosis: Colon cancer  INTERVAL HISTORY:   Anthony Calhoun returns as scheduled.  He reports the mouth ulcers and discomfort have resolved.  He feels well.  Good appetite.  No nausea.  His dentist "filed "down several teeth and associated irritation at the buccal mucosa has improved.  No neuropathy symptoms.  Objective:  Vital signs in last 24 hours:  Blood pressure 113/69, pulse 80, temperature 98.1 F (36.7 C), temperature source Oral, resp. rate 18, height 5' 6"$  (1.676 m), weight 243 lb (110.2 kg), SpO2 98 %.    HEENT: No thrush or ulcers Resp: Lungs clear bilaterally Cardio: Regular rate and rhythm GI: No hepatosplenomegaly, nontender Vascular: No leg edema     Portacath/PICC-without erythema  Lab Results:  Lab Results  Component Value Date   WBC 3.9 (L) 10/09/2022   HGB 12.8 (L) 10/09/2022   HCT 39.2 10/09/2022   MCV 76.7 (L) 10/09/2022   PLT 151 10/09/2022   NEUTROABS 2.3 10/09/2022    CMP  Lab Results  Component Value Date   NA 137 10/09/2022   K 3.8 10/09/2022   CL 102 10/09/2022   CO2 27 10/09/2022   GLUCOSE 216 (H) 10/09/2022   BUN 11 10/09/2022   CREATININE 0.68 10/09/2022   CALCIUM 9.0 10/09/2022   PROT 7.2 10/09/2022   ALBUMIN 3.9 10/09/2022   AST 26 10/09/2022   ALT 40 10/09/2022   ALKPHOS 185 (H) 10/09/2022   BILITOT 0.6 10/09/2022   GFRNONAA >60 10/09/2022   GFRAA >60 05/10/2020    Lab Results  Component Value Date   CEA1 1.08 03/07/2021   CEA 120.80 (H) 09/25/2022    Lab Results  Component Value Date   INR 1.0 02/08/2022   LABPROT 13.5 02/08/2022    Imaging:  No results found.  Medications: I have reviewed the patient's current medications.   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 secondary to nausea (patient discontinued Xeloda on day 11 due to colonoscopy) Colonoscopy 07/09/2020-nodular mucosa at the colonic anastomosis, biopsied; patent end-to-side ileocolonic anastomosis characterized by healthy-appearing mucosa and visible sutures; nonbleeding internal hemorrhoids, biopsy with benign ulcerated anastomotic mucosa with granulation tissue Cycle 4 adjuvant Xeloda 07/19/2020, 2000 mg every morning and 1500 mg every afternoon Cycle 5 adjuvant Xeloda 08/09/2020 Cycle 6 adjuvant Xeloda 08/31/2019 Cycle 7 adjuvant Xeloda 09/20/2020 Cycle 8 adjuvant Xeloda 10/11/2020 CTs 03/07/2021-no evidence of recurrent disease, hepatic steatosis, slight wall thickening at the junction of the descending and horizontal duodenum Mild elevation of CEA 2023 12/01/2021-Guardant Reveal-ct DNA detected PET 12/30/2021-hypermetabolic right liver lesion, hypermetabolic area in a loop of mid small bowel with an SUV of 12.5, review of PET images at GI tumor conference 01/11/2022-small bowel uptake felt to be a benign finding MRI liver 01/10/2022-solitary 1.2 cm right liver lesion between segments 7 and 8 compatible with metastasis, subtle changes suggesting cirrhosis, hepatic steatosis CT enteroscopy 02/02/2022-small bowel loop with hypermetabolic activity on PET continues to have a bandlike density along the margin felt to represent a diverticulum or adjacent nodal tissue.  Small bowel tumor not  excluded.  3-4 mm left omental nodule 02/08/2022-biopsy and ablation of solitary liver lesion, adenocarcinoma consistent with metastatic colorectal adenocarcinoma CTs 03/31/2022-unchanged circumstantial soft tissue thickening  surrounding a loop of mid to distal small bowel with an adjacent nodular focus measuring 1.2 cm.,  Ablation cavity in segment 7, no other evidence of metastatic disease Diagnostic laparoscopy 05/26/2022-mid small bowel mass-resected, well to moderately differentiated adenocarcinoma, 0/2 lymph nodes, morphology consistent with a colon primary, tumor involved full-thickness including serosa with perineural and large vessel involvement; foundation 1-microsatellite stable, tumor mutation burden 2, K-ras Q61H CT abdomen/pelvis 07/10/2022-the right liver ablation defect is smaller, new subtle right liver lesion measuring 13 x 9 mm, enlarged lymph node in the right upper quadrant mesentery adjacent to surgical anastomosis PET 07/26/2022-new segment 6 liver lesion, enlarging hypermetabolic left omental lesion, there is another mildly hypermetabolic peritoneal implant, 7 mm omental nodule at the midline without hypermetabolism Cycle 1 FOLFOX 09/25/2022 Cycle 2 FOLFOX/bevacizumab 10/09/2022, 5-FU dose reduced secondary to mucositis Microcytic anemia secondary to #1-improved Colon polyps-sessile serrated adenoma of the descending colon, tubular adenomas with focal high-grade dysplasia of the transverse colon on colonoscopy 02/12/2020, tubular adenoma of the mid ascending colon on the surgical specimen 04/12/2020 Family history of colon cancer Depression Anxiety/panic attacks Hypertension Hyperlipidemia Nausea secondary to Xeloda?-Resolved Gastroesophageal reflux disease-improved following discontinuation of Xeloda Anemia, microcytic; ferritin 6 01/24/2022; stool positive for blood x3 01/25/2022; 02/02/2022 CT abdomen/pelvis enterography-bowel loop demonstrating hypermetabolic activity continues to have a bandlike density along its margin possibly representing a diverticulum or adjacent nodal tissue, difficult to exclude small bowel tumor, 3 x 4 mm left omental nodule or lymph node, known right hepatic lobe tumor is  occult on CT. Venofer weekly x3 beginning 03/03/2022 Negative capsule endoscopy 03/09/2022 Venofer 03/03/2022 for 3 weekly doses Venofer 05/03/2022, 05/19/2022 Venofer 07/28/2022, 08/04/2022 12.  Anterior left neck nodule 03/17/2022-cyst?,  Lymph node? 13.  Mucositis secondary to chemotherapy at office visit 10/03/2022-the 5-FU will be dose reduced with cycle 2 FOLFOX     Disposition: Anthony Calhoun completed 1 cycle of FOLFOX 09/25/2022.  Chemotherapy was complicated by mucositis.  The mouth ulcers have healed.  He will complete cycle 2 FOLFOX today.  Bevacizumab will be added.  He would like to place planned tooth extractions on hold so he can proceed with bevacizumab therapy.  The 5-FU will be dose reduced with this cycle of chemotherapy.  Anthony Calhoun will return for an office visit and chemotherapy in 2 weeks.  Betsy Coder, MD  10/09/2022  10:47 AM

## 2022-10-09 NOTE — Progress Notes (Signed)
Mr. Janis is requesting script for EMLA cream for port access. He has also decided against dental extractions at this time. Wants to have the Avastin added to his chemo as planned. Notified dental practice of [facility] of the change in his plans [phone]).

## 2022-10-09 NOTE — Patient Instructions (Addendum)
[facility]   The chemotherapy medication bag should finish at 46 hours, 96 hours, or 7 days. For example, if your pump is scheduled for 46 hours and it was put on at 4:00 p.m., it should finish at 2:00 p.m. the day it is scheduled to come off regardless of your appointment time.     Estimated time to finish at 12:30 Wednesday, October 11, 2022.   If the display on your pump reads "Low Volume" and it is beeping, take the batteries out of the pump and come to the cancer center for it to be taken off.   If the pump alarms go off prior to the pump reading "Low Volume" then call [phone] and someone can assist you.  If the plunger comes out and the chemotherapy medication is leaking out, please use your home chemo spill kit to clean up the spill. Do NOT use paper towels or other household products.  If you have problems or questions regarding your pump, please call either [phone] (24 hours a day) or the cancer center Monday-Friday 8:00 a.m.- 4:30 p.m. at the clinic number and we will assist you. If you are unable to get assistance, then go to the nearest Emergency Department and ask the staff to contact the IV team for assistance.  Discharge Instructions: Thank you for choosing [facility] to provide your oncology and hematology care.   If you have a lab appointment with the [facility], please go directly to the [facility] and check in at the registration area.   Wear comfortable clothing and clothing appropriate for easy access to any Portacath or PICC line.   We strive to give you quality time with your provider. You may need to reschedule your appointment if you arrive late (15 or more minutes).  Arriving late affects you and other patients whose appointments are after yours.  Also, if you miss three or more appointments without notifying the office, you may be dismissed from the clinic at the provider's discretion.      For  prescription refill requests, have your pharmacy contact our office and allow 72 hours for refills to be completed.    Today you received the following chemotherapy and/or immunotherapy agents Avastin, Oxaliplatin, Leucovorin, Fluorouracil.      To help prevent nausea and vomiting after your treatment, we encourage you to take your nausea medication as directed.  BELOW ARE SYMPTOMS THAT SHOULD BE REPORTED IMMEDIATELY: *FEVER GREATER THAN 100.4 F (38 C) OR HIGHER *CHILLS OR SWEATING *NAUSEA AND VOMITING THAT IS NOT CONTROLLED WITH YOUR NAUSEA MEDICATION *UNUSUAL SHORTNESS OF BREATH *UNUSUAL BRUISING OR BLEEDING *URINARY PROBLEMS (pain or burning when urinating, or frequent urination) *BOWEL PROBLEMS (unusual diarrhea, constipation, pain near the anus) TENDERNESS IN MOUTH AND THROAT WITH OR WITHOUT PRESENCE OF ULCERS (sore throat, sores in mouth, or a toothache) UNUSUAL RASH, SWELLING OR PAIN  UNUSUAL VAGINAL DISCHARGE OR ITCHING   Items with * indicate a potential emergency and should be followed up as soon as possible or go to the Emergency Department if any problems should occur.  Please show the CHEMOTHERAPY ALERT CARD or IMMUNOTHERAPY ALERT CARD at check-in to the Emergency Department and triage nurse.  Should you have questions after your visit or need to cancel or reschedule your appointment, please contact [facility]  Dept: [phone]  and follow the prompts.  Office hours are 8:00 a.m. to 4:30 p.m. Monday - Friday. Please  note that voicemails left after 4:00 p.m. may not be returned until the following business day.  We are closed weekends and major holidays. You have access to a nurse at all times for urgent questions. Please call the main number to the clinic Dept: [phone] and follow the prompts.   For any non-urgent questions, you may also contact your provider using MyChart. We now offer e-Visits for anyone 38 and older to request care  online for non-urgent symptoms. For details visit [email or web address].   Also download the MyChart app! Go to the app store, search "MyChart", open the app, select [facility], and log in with your MyChart username and password.  Bevacizumab Injection What is this medication? BEVACIZUMAB (be va SIZ yoo mab) treats some types of cancer. It works by blocking a protein that causes cancer cells to grow and multiply. This helps to slow or stop the spread of cancer cells. It is a monoclonal antibody. This medicine may be used for other purposes; ask your health care provider or pharmacist if you have questions. COMMON BRAND NAME(S): Alymsys, Avastin, MVASI, Noah Charon What should I tell my care team before I take this medication? They need to know if you have any of these conditions: Blood clots Coughing up blood Having or recent surgery Heart failure High blood pressure History of a connection between 2 or more body parts that do not usually connect (fistula) History of a tear in your stomach or intestines Protein in your urine An unusual or allergic reaction to bevacizumab, other medications, foods, dyes, or preservatives Pregnant or trying to get pregnant Breast-feeding How should I use this medication? This medication is injected into a vein. It is given by your care team in a hospital or clinic setting. Talk to your care team the use of this medication in children. Special care may be needed. Overdosage: If you think you have taken too much of this medicine contact a poison control center or emergency room at once. NOTE: This medicine is only for you. Do not share this medicine with others. What if I miss a dose? Keep appointments for follow-up doses. It is important not to miss your dose. Call your care team if you are unable to keep an appointment. What may interact with this medication? Interactions are not expected. This list may not describe all possible interactions. Give your  health care provider a list of all the medicines, herbs, non-prescription drugs, or dietary supplements you use. Also tell them if you smoke, drink alcohol, or use illegal drugs. Some items may interact with your medicine. What should I watch for while using this medication? Your condition will be monitored carefully while you are receiving this medication. You may need blood work while taking this medication. This medication may make you feel generally unwell. This is not uncommon as chemotherapy can affect healthy cells as well as cancer cells. Report any side effects. Continue your course of treatment even though you feel ill unless your care team tells you to stop. This medication may increase your risk to bruise or bleed. Call your care team if you notice any unusual bleeding. Before having surgery, talk to your care team to make sure it is ok. This medication can increase the risk of poor healing of your surgical site or wound. You will need to stop this medication for 28 days before surgery. After surgery, wait at least 28 days before restarting this medication. Make sure the surgical site or wound is healed enough before restarting  this medication. Talk to your care team if questions. Talk to your care team if you may be pregnant. Serious birth defects can occur if you take this medication during pregnancy and for 6 months after the last dose. Contraception is recommended while taking this medication and for 6 months after the last dose. Your care team can help you find the option that works for you. Do not breastfeed while taking this medication and for 6 months after the last dose. This medication can cause infertility. Talk to your care team if you are concerned about your fertility. What side effects may I notice from receiving this medication? Side effects that you should report to your care team as soon as possible: Allergic reactions--skin rash, itching, hives, swelling of the face, lips,  tongue, or throat Bleeding--bloody or black, tar-like stools, vomiting blood or brown material that looks like coffee grounds, red or dark brown urine, small red or purple spots on skin, unusual bruising or bleeding Blood clot--pain, swelling, or warmth in the leg, shortness of breath, chest pain Heart attack--pain or tightness in the chest, shoulders, arms, or jaw, nausea, shortness of breath, cold or clammy skin, feeling faint or lightheaded Heart failure--shortness of breath, swelling of the ankles, feet, or hands, sudden weight gain, unusual weakness or fatigue Increase in blood pressure Infection--fever, chills, cough, sore throat, wounds that don't heal, pain or trouble when passing urine, general feeling of discomfort or being unwell Infusion reactions--chest pain, shortness of breath or trouble breathing, feeling faint or lightheaded Kidney injury--decrease in the amount of urine, swelling of the ankles, hands, or feet Stomach pain that is severe, does not go away, or gets worse Stroke--sudden numbness or weakness of the face, arm, or leg, trouble speaking, confusion, trouble walking, loss of balance or coordination, dizziness, severe headache, change in vision Sudden and severe headache, confusion, change in vision, seizures, which may be signs of posterior reversible encephalopathy syndrome (PRES) Side effects that usually do not require medical attention (report to your care team if they continue or are bothersome): Back pain Change in taste Diarrhea Dry skin Increased tears Nosebleed This list may not describe all possible side effects. Call your doctor for medical advice about side effects. You may report side effects to FDA at 1-800-FDA-1088. Where should I keep my medication? This medication is given in a hospital or clinic. It will not be stored at home. NOTE: This sheet is a summary. It may not cover all possible information. If you have questions about this medicine, talk to  your doctor, pharmacist, or health care provider.  2023 Elsevier/Gold Standard (2021-12-16 00:00:00)  Oxaliplatin Injection What is this medication? OXALIPLATIN (ox AL i PLA tin) treats some types of cancer. It works by slowing down the growth of cancer cells. This medicine may be used for other purposes; ask your health care provider or pharmacist if you have questions. COMMON BRAND NAME(S): Eloxatin What should I tell my care team before I take this medication? They need to know if you have any of these conditions: Heart disease History of irregular heartbeat or rhythm Liver disease Low blood cell levels (white cells, red cells, and platelets) Lung or breathing disease, such as asthma Take medications that treat or prevent blood clots Tingling of the fingers, toes, or other nerve disorder An unusual or allergic reaction to oxaliplatin, other medications, foods, dyes, or preservatives If you or your partner are pregnant or trying to get pregnant Breast-feeding How should I use this medication? This medication  is injected into a vein. It is given by your care team in a hospital or clinic setting. Talk to your care team about the use of this medication in children. Special care may be needed. Overdosage: If you think you have taken too much of this medicine contact a poison control center or emergency room at once. NOTE: This medicine is only for you. Do not share this medicine with others. What if I miss a dose? Keep appointments for follow-up doses. It is important not to miss a dose. Call your care team if you are unable to keep an appointment. What may interact with this medication? Do not take this medication with any of the following: Cisapride Dronedarone Pimozide Thioridazine This medication may also interact with the following: Aspirin and aspirin-like medications Certain medications that treat or prevent blood clots, such as warfarin, apixaban, dabigatran, and  rivaroxaban Cisplatin Cyclosporine Diuretics Medications for infection, such as acyclovir, adefovir, amphotericin B, bacitracin, cidofovir, foscarnet, ganciclovir, gentamicin, pentamidine, vancomycin NSAIDs, medications for pain and inflammation, such as ibuprofen or naproxen Other medications that cause heart rhythm changes Pamidronate Zoledronic acid This list may not describe all possible interactions. Give your health care provider a list of all the medicines, herbs, non-prescription drugs, or dietary supplements you use. Also tell them if you smoke, drink alcohol, or use illegal drugs. Some items may interact with your medicine. What should I watch for while using this medication? Your condition will be monitored carefully while you are receiving this medication. You may need blood work while taking this medication. This medication may make you feel generally unwell. This is not uncommon as chemotherapy can affect healthy cells as well as cancer cells. Report any side effects. Continue your course of treatment even though you feel ill unless your care team tells you to stop. This medication may increase your risk of getting an infection. Call your care team for advice if you get a fever, chills, sore throat, or other symptoms of a cold or flu. Do not treat yourself. Try to avoid being around people who are sick. Avoid taking medications that contain aspirin, acetaminophen, ibuprofen, naproxen, or ketoprofen unless instructed by your care team. These medications may hide a fever. Be careful brushing or flossing your teeth or using a toothpick because you may get an infection or bleed more easily. If you have any dental work done, tell your dentist you are receiving this medication. This medication can make you more sensitive to cold. Do not drink cold drinks or use ice. Cover exposed skin before coming in contact with cold temperatures or cold objects. When out in cold weather wear warm clothing  and cover your mouth and nose to warm the air that goes into your lungs. Tell your care team if you get sensitive to the cold. Talk to your care team if you or your partner are pregnant or think either of you might be pregnant. This medication can cause serious birth defects if taken during pregnancy and for 9 months after the last dose. A negative pregnancy test is required before starting this medication. A reliable form of contraception is recommended while taking this medication and for 9 months after the last dose. Talk to your care team about effective forms of contraception. Do not father a child while taking this medication and for 6 months after the last dose. Use a condom while having sex during this time period. Do not breastfeed while taking this medication and for 3 months after the last  dose. This medication may cause infertility. Talk to your care team if you are concerned about your fertility. What side effects may I notice from receiving this medication? Side effects that you should report to your care team as soon as possible: Allergic reactions--skin rash, itching, hives, swelling of the face, lips, tongue, or throat Bleeding--bloody or black, tar-like stools, vomiting blood or brown material that looks like coffee grounds, red or dark brown urine, small red or purple spots on skin, unusual bruising or bleeding Dry cough, shortness of breath or trouble breathing Heart rhythm changes--fast or irregular heartbeat, dizziness, feeling faint or lightheaded, chest pain, trouble breathing Infection--fever, chills, cough, sore throat, wounds that don't heal, pain or trouble when passing urine, general feeling of discomfort or being unwell Liver injury--right upper belly pain, loss of appetite, nausea, light-colored stool, dark yellow or brown urine, yellowing skin or eyes, unusual weakness or fatigue Low red blood cell level--unusual weakness or fatigue, dizziness, headache, trouble  breathing Muscle injury--unusual weakness or fatigue, muscle pain, dark yellow or brown urine, decrease in amount of urine Pain, tingling, or numbness in the hands or feet Sudden and severe headache, confusion, change in vision, seizures, which may be signs of posterior reversible encephalopathy syndrome (PRES) Unusual bruising or bleeding Side effects that usually do not require medical attention (report to your care team if they continue or are bothersome): Diarrhea Nausea Pain, redness, or swelling with sores inside the mouth or throat Unusual weakness or fatigue Vomiting This list may not describe all possible side effects. Call your doctor for medical advice about side effects. You may report side effects to FDA at 1-800-FDA-1088. Where should I keep my medication? This medication is given in a hospital or clinic. It will not be stored at home. NOTE: This sheet is a summary. It may not cover all possible information. If you have questions about this medicine, talk to your doctor, pharmacist, or health care provider.  2023 Elsevier/Gold Standard (2007-10-05 00:00:00)  Leucovorin Injection What is this medication? LEUCOVORIN (loo koe VOR in) prevents side effects from certain medications, such as methotrexate. It works by increasing folate levels. This helps protect healthy cells in your body. It may also be used to treat anemia caused by low levels of folate. It can also be used with fluorouracil, a type of chemotherapy, to treat colorectal cancer. It works by increasing the effects of fluorouracil in the body. This medicine may be used for other purposes; ask your health care provider or pharmacist if you have questions. What should I tell my care team before I take this medication? They need to know if you have any of these conditions: Anemia from low levels of vitamin B12 in the blood An unusual or allergic reaction to leucovorin, folic acid, other medications, foods, dyes, or  preservatives Pregnant or trying to get pregnant Breastfeeding How should I use this medication? This medication is injected into a vein or a muscle. It is given by your care team in a hospital or clinic setting. Talk to your care team about the use of this medication in children. Special care may be needed. Overdosage: If you think you have taken too much of this medicine contact a poison control center or emergency room at once. NOTE: This medicine is only for you. Do not share this medicine with others. What if I miss a dose? Keep appointments for follow-up doses. It is important not to miss your dose. Call your care team if you are unable to  keep an appointment. What may interact with this medication? Capecitabine Fluorouracil Phenobarbital Phenytoin Primidone Trimethoprim;sulfamethoxazole This list may not describe all possible interactions. Give your health care provider a list of all the medicines, herbs, non-prescription drugs, or dietary supplements you use. Also tell them if you smoke, drink alcohol, or use illegal drugs. Some items may interact with your medicine. What should I watch for while using this medication? Your condition will be monitored carefully while you are receiving this medication. This medication may increase the side effects of 5-fluorouracil. Tell your care team if you have diarrhea or mouth sores that do not get better or that get worse. What side effects may I notice from receiving this medication? Side effects that you should report to your care team as soon as possible: Allergic reactions--skin rash, itching, hives, swelling of the face, lips, tongue, or throat This list may not describe all possible side effects. Call your doctor for medical advice about side effects. You may report side effects to FDA at 1-800-FDA-1088. Where should I keep my medication? This medication is given in a hospital or clinic. It will not be stored at home. NOTE: This sheet is  a summary. It may not cover all possible information. If you have questions about this medicine, talk to your doctor, pharmacist, or health care provider.  2023 Elsevier/Gold Standard (2022-01-17 00:00:00)  Fluorouracil Injection What is this medication? FLUOROURACIL (flure oh YOOR a sil) treats some types of cancer. It works by slowing down the growth of cancer cells. This medicine may be used for other purposes; ask your health care provider or pharmacist if you have questions. COMMON BRAND NAME(S): Adrucil What should I tell my care team before I take this medication? They need to know if you have any of these conditions: Blood disorders Dihydropyrimidine dehydrogenase (DPD) deficiency Infection, such as chickenpox, cold sores, herpes Kidney disease Liver disease Poor nutrition Recent or ongoing radiation therapy An unusual or allergic reaction to fluorouracil, other medications, foods, dyes, or preservatives If you or your partner are pregnant or trying to get pregnant Breast-feeding How should I use this medication? This medication is injected into a vein. It is administered by your care team in a hospital or clinic setting. Talk to your care team about the use of this medication in children. Special care may be needed. Overdosage: If you think you have taken too much of this medicine contact a poison control center or emergency room at once. NOTE: This medicine is only for you. Do not share this medicine with others. What if I miss a dose? Keep appointments for follow-up doses. It is important not to miss your dose. Call your care team if you are unable to keep an appointment. What may interact with this medication? Do not take this medication with any of the following: Live virus vaccines This medication may also interact with the following: Medications that treat or prevent blood clots, such as warfarin, enoxaparin, dalteparin This list may not describe all possible  interactions. Give your health care provider a list of all the medicines, herbs, non-prescription drugs, or dietary supplements you use. Also tell them if you smoke, drink alcohol, or use illegal drugs. Some items may interact with your medicine. What should I watch for while using this medication? Your condition will be monitored carefully while you are receiving this medication. This medication may make you feel generally unwell. This is not uncommon as chemotherapy can affect healthy cells as well as cancer cells. Report any  side effects. Continue your course of treatment even though you feel ill unless your care team tells you to stop. In some cases, you may be given additional medications to help with side effects. Follow all directions for their use. This medication may increase your risk of getting an infection. Call your care team for advice if you get a fever, chills, sore throat, or other symptoms of a cold or flu. Do not treat yourself. Try to avoid being around people who are sick. This medication may increase your risk to bruise or bleed. Call your care team if you notice any unusual bleeding. Be careful brushing or flossing your teeth or using a toothpick because you may get an infection or bleed more easily. If you have any dental work done, tell your dentist you are receiving this medication. Avoid taking medications that contain aspirin, acetaminophen, ibuprofen, naproxen, or ketoprofen unless instructed by your care team. These medications may hide a fever. Do not treat diarrhea with over the counter products. Contact your care team if you have diarrhea that lasts more than 2 days or if it is severe and watery. This medication can make you more sensitive to the sun. Keep out of the sun. If you cannot avoid being in the sun, wear protective clothing and sunscreen. Do not use sun lamps, tanning beds, or tanning booths. Talk to your care team if you or your partner wish to become pregnant  or think you might be pregnant. This medication can cause serious birth defects if taken during pregnancy and for 3 months after the last dose. A reliable form of contraception is recommended while taking this medication and for 3 months after the last dose. Talk to your care team about effective forms of contraception. Do not father a child while taking this medication and for 3 months after the last dose. Use a condom while having sex during this time period. Do not breastfeed while taking this medication. This medication may cause infertility. Talk to your care team if you are concerned about your fertility. What side effects may I notice from receiving this medication? Side effects that you should report to your care team as soon as possible: Allergic reactions--skin rash, itching, hives, swelling of the face, lips, tongue, or throat Heart attack--pain or tightness in the chest, shoulders, arms, or jaw, nausea, shortness of breath, cold or clammy skin, feeling faint or lightheaded Heart failure--shortness of breath, swelling of the ankles, feet, or hands, sudden weight gain, unusual weakness or fatigue Heart rhythm changes--fast or irregular heartbeat, dizziness, feeling faint or lightheaded, chest pain, trouble breathing High ammonia level--unusual weakness or fatigue, confusion, loss of appetite, nausea, vomiting, seizures Infection--fever, chills, cough, sore throat, wounds that don't heal, pain or trouble when passing urine, general feeling of discomfort or being unwell Low red blood cell level--unusual weakness or fatigue, dizziness, headache, trouble breathing Pain, tingling, or numbness in the hands or feet, muscle weakness, change in vision, confusion or trouble speaking, loss of balance or coordination, trouble walking, seizures Redness, swelling, and blistering of the skin over hands and feet Severe or prolonged diarrhea Unusual bruising or bleeding Side effects that usually do not  require medical attention (report to your care team if they continue or are bothersome): Dry skin Headache Increased tears Nausea Pain, redness, or swelling with sores inside the mouth or throat Sensitivity to light Vomiting This list may not describe all possible side effects. Call your doctor for medical advice about side effects. You may report  side effects to FDA at 1-800-FDA-1088. Where should I keep my medication? This medication is given in a hospital or clinic. It will not be stored at home. NOTE: This sheet is a summary. It may not cover all possible information. If you have questions about this medicine, talk to your doctor, pharmacist, or health care provider.  2023 Elsevier/Gold Standard (2021-12-13 00:00:00)

## 2022-10-09 NOTE — Progress Notes (Signed)
Patient seen by Dr. Benay Spice today  Vitals are within treatment parameters.  Labs reviewed by Dr. Benay Spice and are within treatment parameters. May use urine protein result of 1/29 for treatment today.  Per physician team, patient is ready for treatment. Please note that modifications are being made to the treatment plan including MD has added Avastin back to treatment. Patient wants to hold off on dental extractions for now.

## 2022-10-10 ENCOUNTER — Encounter: Payer: Self-pay | Admitting: Oncology

## 2022-10-10 ENCOUNTER — Other Ambulatory Visit: Payer: Self-pay

## 2022-10-10 ENCOUNTER — Telehealth: Payer: Self-pay | Admitting: Emergency Medicine

## 2022-10-10 NOTE — Telephone Encounter (Signed)
24 Hour Callback  24 Hour callback post 1st time Bevacizumab. Pt reports that he is feeling well from the new treatment. Pt does report sensitivity to even room temperature drinks from oxaliplatin with occasional jaw pain. Pt also reports that he sent a message to Dr Gearldine Shown nurse regarding vision changes and eye pressure that comes and goes and is hoping to hear something from her this evening. Otherwise patient is eating and drinking well. Denies any N/V/D. Pt knows to contact office with any questions or concerns.

## 2022-10-11 ENCOUNTER — Inpatient Hospital Stay: Payer: Medicaid Other

## 2022-10-11 ENCOUNTER — Inpatient Hospital Stay: Payer: Medicaid Other | Admitting: Nutrition

## 2022-10-11 VITALS — BP 110/68 | HR 74 | Temp 98.1°F | Resp 18

## 2022-10-11 DIAGNOSIS — Z5111 Encounter for antineoplastic chemotherapy: Secondary | ICD-10-CM | POA: Diagnosis not present

## 2022-10-11 DIAGNOSIS — C189 Malignant neoplasm of colon, unspecified: Secondary | ICD-10-CM

## 2022-10-11 MED ORDER — HEPARIN SOD (PORK) LOCK FLUSH 100 UNIT/ML IV SOLN
500.0000 [IU] | Freq: Once | INTRAVENOUS | Status: AC | PRN
  Administered 2022-10-11: 500 [IU]

## 2022-10-11 MED ORDER — SODIUM CHLORIDE 0.9% FLUSH
10.0000 mL | INTRAVENOUS | Status: DC | PRN
  Administered 2022-10-11: 10 mL

## 2022-10-11 NOTE — Progress Notes (Signed)
57 year old male diagnosed with met Colon cancer with liver lesions in November 2023. He is followed by Dr. Benay Spice and is receiving FOLFOX.  PMH includes Depression, anxiety, HTN, HLD, GERD, Anemia.  Medications include Xanax, MMW, Zoloft  Labs include Glucose 216 on Feb 12.  Height: 5'6". Weight: 243 pounds on Feb 6.  UBW: 245 pounds on January 25.  BMI: 38.27.  Patient reports he is interested in learning how to eat to better control his blood sugars. He would like to maintain his weight on treatment but wants to make healthier choices.  Reports mouth sores with increased pain and decreased oral intake after FOLFOX. This has improved and he is back to eating his usual foods. He has consumed Equate shakes for DM and likes them. He has financial insecurity to purchase these and asks for assistance.  Nutrition Diagnosis: Food and Nutrition Related Knowledge Deficit related to colon cancer and associated treatments as evidenced by no prior need for nutrition related information.  Intervention: Educated on NCS diet and provided tips on pairing proteins with CHO to improve glycemic control. Reviewed sources of protein foods. Educated on soft foods if mucositis occurs again. Reviewed plant-based diet. Provided suggestions for snacks. Encouraged small meals. Nutrition fact sheets given. Contact information provided.  Will investigate availability of sample nutrition supplements for patient. Provided coupons.  Monitoring, Evaluation, Goals: Patient will tolerate adequate calories and protein for wt maintenance with acceptable glycemic control  No follow up scheduled.

## 2022-10-11 NOTE — Patient Instructions (Signed)

## 2022-10-12 ENCOUNTER — Inpatient Hospital Stay: Payer: Medicaid Other

## 2022-10-12 ENCOUNTER — Inpatient Hospital Stay: Payer: Medicaid Other | Admitting: Nutrition

## 2022-10-12 NOTE — Progress Notes (Signed)
[facility] CSW Progress Note  Clinical Education officer, museum contacted patient by phone to assess needs.  He stated his disability has been approved.  He had questions about the [employer].  CSW requested the financial counselor contact him.  He said the chemo treatments are causing him to not feel well.  CSW provided active listening and supportive counseling.    Rodman Pickle Karlen Barbar, LCSW    Patient is participating in a Managed Medicaid Plan:  Yes

## 2022-10-12 NOTE — Progress Notes (Signed)
Nutrition follow-up completed with patient on telephone regarding samples of oral nutrition supplements.  Patient prefers chocolate flavored Glucerna.  I contacted him to inform him that I did have a few samples of chocolate flavored Glucerna.  I also suggested he try the vanilla Glucerna 1 1.5 mixed with a small amount of chocolate syrup or cocoa.  Patient agreeable.  He would like to wait until Monday February 26 to pick up his samples from the [facility].  Patient is very [employer] attorney.

## 2022-10-16 ENCOUNTER — Encounter: Payer: Self-pay | Admitting: Oncology

## 2022-10-16 ENCOUNTER — Encounter: Payer: Self-pay | Admitting: Nurse Practitioner

## 2022-10-22 ENCOUNTER — Other Ambulatory Visit: Payer: Self-pay | Admitting: Oncology

## 2022-10-23 ENCOUNTER — Inpatient Hospital Stay: Payer: Medicaid Other

## 2022-10-23 ENCOUNTER — Encounter: Payer: Self-pay | Admitting: Nurse Practitioner

## 2022-10-23 ENCOUNTER — Inpatient Hospital Stay: Payer: Medicaid Other | Admitting: Oncology

## 2022-10-23 ENCOUNTER — Encounter: Payer: Self-pay | Admitting: Oncology

## 2022-10-23 VITALS — BP 132/77 | HR 96 | Temp 98.1°F | Resp 18 | Ht 66.0 in | Wt 244.2 lb

## 2022-10-23 DIAGNOSIS — C189 Malignant neoplasm of colon, unspecified: Secondary | ICD-10-CM

## 2022-10-23 DIAGNOSIS — C787 Secondary malignant neoplasm of liver and intrahepatic bile duct: Secondary | ICD-10-CM | POA: Diagnosis not present

## 2022-10-23 DIAGNOSIS — Z5111 Encounter for antineoplastic chemotherapy: Secondary | ICD-10-CM | POA: Diagnosis not present

## 2022-10-23 LAB — CMP (CANCER CENTER ONLY)
ALT: 35 U/L (ref 0–44)
AST: 34 U/L (ref 15–41)
Albumin: 4 g/dL (ref 3.5–5.0)
Alkaline Phosphatase: 178 U/L — ABNORMAL HIGH (ref 38–126)
Anion gap: 9 (ref 5–15)
BUN: 10 mg/dL (ref 6–20)
CO2: 25 mmol/L (ref 22–32)
Calcium: 9.1 mg/dL (ref 8.9–10.3)
Chloride: 101 mmol/L (ref 98–111)
Creatinine: 0.81 mg/dL (ref 0.61–1.24)
GFR, Estimated: >60 mL/min (ref >60)
Glucose, Bld: 301 mg/dL — ABNORMAL HIGH (ref 70–99)
Potassium: 3.6 mmol/L (ref 3.5–5.1)
Sodium: 135 mmol/L (ref 135–145)
Total Bilirubin: 0.8 mg/dL (ref 0.3–1.2)
Total Protein: 7.3 g/dL (ref 6.5–8.1)

## 2022-10-23 LAB — CBC WITH DIFFERENTIAL (CANCER CENTER ONLY)
Abs Immature Granulocytes: 0.01 10*3/uL (ref 0.00–0.07)
Basophils Absolute: 0.1 10*3/uL (ref 0.0–0.1)
Basophils Relative: 1 %
Eosinophils Absolute: 0.1 10*3/uL (ref 0.0–0.5)
Eosinophils Relative: 3 %
HCT: 41.7 % (ref 39.0–52.0)
Hemoglobin: 13.3 g/dL (ref 13.0–17.0)
Immature Granulocytes: 0 %
Lymphocytes Relative: 23 %
Lymphs Abs: 1.1 10*3/uL (ref 0.7–4.0)
MCH: 25 pg — ABNORMAL LOW (ref 26.0–34.0)
MCHC: 31.9 g/dL (ref 30.0–36.0)
MCV: 78.5 fL — ABNORMAL LOW (ref 80.0–100.0)
Monocytes Absolute: 0.6 10*3/uL (ref 0.1–1.0)
Monocytes Relative: 12 %
Neutro Abs: 2.9 10*3/uL (ref 1.7–7.7)
Neutrophils Relative %: 61 %
Platelet Count: 146 10*3/uL — ABNORMAL LOW (ref 150–400)
RBC: 5.31 MIL/uL (ref 4.22–5.81)
RDW: 17.8 % — ABNORMAL HIGH (ref 11.5–15.5)
WBC Count: 4.8 10*3/uL (ref 4.0–10.5)
nRBC: 0 % (ref 0.0–0.2)

## 2022-10-23 LAB — CEA (ACCESS): CEA (CHCC): 59.36 ng/mL — ABNORMAL HIGH (ref 0.00–5.00)

## 2022-10-23 LAB — TOTAL PROTEIN, URINE DIPSTICK: Protein, ur: NEGATIVE mg/dL

## 2022-10-23 MED ORDER — OXALIPLATIN CHEMO INJECTION 100 MG/20ML
85.0000 mg/m2 | Freq: Once | INTRAVENOUS | Status: AC
  Administered 2022-10-23: 200 mg via INTRAVENOUS
  Filled 2022-10-23: qty 40

## 2022-10-23 MED ORDER — SODIUM CHLORIDE 0.9 % IV SOLN
5.0000 mg/kg | Freq: Once | INTRAVENOUS | Status: AC
  Administered 2022-10-23: 500 mg via INTRAVENOUS
  Filled 2022-10-23: qty 16

## 2022-10-23 MED ORDER — SODIUM CHLORIDE 0.9 % IV SOLN: Freq: Once | INTRAVENOUS | Status: AC

## 2022-10-23 MED ORDER — FLUOROURACIL CHEMO INJECTION 500 MG/10ML
200.0000 mg/m2 | Freq: Once | INTRAVENOUS | Status: AC
  Administered 2022-10-23: 450 mg via INTRAVENOUS
  Filled 2022-10-23: qty 9

## 2022-10-23 MED ORDER — PALONOSETRON HCL INJECTION 0.25 MG/5ML
0.2500 mg | Freq: Once | INTRAVENOUS | Status: AC
  Administered 2022-10-23: 0.25 mg via INTRAVENOUS
  Filled 2022-10-23: qty 5

## 2022-10-23 MED ORDER — LEUCOVORIN CALCIUM INJECTION 350 MG
200.0000 mg/m2 | Freq: Once | INTRAVENOUS | Status: AC
  Administered 2022-10-23: 448 mg via INTRAVENOUS
  Filled 2022-10-23: qty 22.4

## 2022-10-23 MED ORDER — SODIUM CHLORIDE 0.9 % IV SOLN
10.0000 mg | Freq: Once | INTRAVENOUS | Status: AC
  Administered 2022-10-23: 10 mg via INTRAVENOUS
  Filled 2022-10-23: qty 1

## 2022-10-23 MED ORDER — SODIUM CHLORIDE 0.9 % IV SOLN
1600.0000 mg/m2 | INTRAVENOUS | Status: DC
  Administered 2022-10-23: 3500 mg via INTRAVENOUS
  Filled 2022-10-23: qty 70

## 2022-10-23 MED ORDER — DEXTROSE 5 % IV SOLN: Freq: Once | INTRAVENOUS | Status: AC

## 2022-10-23 NOTE — Patient Instructions (Addendum)
[facility]   The chemotherapy medication bag should finish at 46 hours, 96 hours, or 7 days. For example, if your pump is scheduled for 46 hours and it was put on at 4:00 p.m., it should finish at 2:00 p.m. the day it is scheduled to come off regardless of your appointment time.     Estimated time to finish at 12:15pm Wednesday, October 25, 2022.   If the display on your pump reads "Low Volume" and it is beeping, take the batteries out of the pump and come to the cancer center for it to be taken off.   If the pump alarms go off prior to the pump reading "Low Volume" then call [phone] and someone can assist you.  If the plunger comes out and the chemotherapy medication is leaking out, please use your home chemo spill kit to clean up the spill. Do NOT use paper towels or other household products.  If you have problems or questions regarding your pump, please call either [phone] (24 hours a day) or the cancer center Monday-Friday 8:00 a.m.- 4:30 p.m. at the clinic number and we will assist you. If you are unable to get assistance, then go to the nearest Emergency Department and ask the staff to contact the IV team for assistance.  Discharge Instructions: Thank you for choosing [facility] to provide your oncology and hematology care.   If you have a lab appointment with the [facility], please go directly to the [facility] and check in at the registration area.   Wear comfortable clothing and clothing appropriate for easy access to any Portacath or PICC line.   We strive to give you quality time with your provider. You may need to reschedule your appointment if you arrive late (15 or more minutes).  Arriving late affects you and other patients whose appointments are after yours.  Also, if you miss three or more appointments without notifying the office, you may be dismissed from the clinic at the provider's discretion.      For  prescription refill requests, have your pharmacy contact our office and allow 72 hours for refills to be completed.    Today you received the following chemotherapy and/or immunotherapy agents Avastin, Oxaliplatin, Leucovorin, Fluorouracil.      To help prevent nausea and vomiting after your treatment, we encourage you to take your nausea medication as directed.  BELOW ARE SYMPTOMS THAT SHOULD BE REPORTED IMMEDIATELY: *FEVER GREATER THAN 100.4 F (38 C) OR HIGHER *CHILLS OR SWEATING *NAUSEA AND VOMITING THAT IS NOT CONTROLLED WITH YOUR NAUSEA MEDICATION *UNUSUAL SHORTNESS OF BREATH *UNUSUAL BRUISING OR BLEEDING *URINARY PROBLEMS (pain or burning when urinating, or frequent urination) *BOWEL PROBLEMS (unusual diarrhea, constipation, pain near the anus) TENDERNESS IN MOUTH AND THROAT WITH OR WITHOUT PRESENCE OF ULCERS (sore throat, sores in mouth, or a toothache) UNUSUAL RASH, SWELLING OR PAIN  UNUSUAL VAGINAL DISCHARGE OR ITCHING   Items with * indicate a potential emergency and should be followed up as soon as possible or go to the Emergency Department if any problems should occur.  Please show the CHEMOTHERAPY ALERT CARD or IMMUNOTHERAPY ALERT CARD at check-in to the Emergency Department and triage nurse.  Should you have questions after your visit or need to cancel or reschedule your appointment, please contact [facility]  Dept: [phone]  and follow the prompts.  Office hours are 8:00 a.m. to 4:30 p.m. Monday - Friday. Please  note that voicemails left after 4:00 p.m. may not be returned until the following business day.  We are closed weekends and major holidays. You have access to a nurse at all times for urgent questions. Please call the main number to the clinic Dept: [phone] and follow the prompts.   For any non-urgent questions, you may also contact your provider using MyChart. We now offer e-Visits for anyone 71 and older to request care  online for non-urgent symptoms. For details visit [email or web address].   Also download the MyChart app! Go to the app store, search "MyChart", open the app, select [facility], and log in with your MyChart username and password.  Bevacizumab Injection What is this medication? BEVACIZUMAB (be va SIZ yoo mab) treats some types of cancer. It works by blocking a protein that causes cancer cells to grow and multiply. This helps to slow or stop the spread of cancer cells. It is a monoclonal antibody. This medicine may be used for other purposes; ask your health care provider or pharmacist if you have questions. COMMON BRAND NAME(S): Alymsys, Avastin, MVASI, Noah Charon What should I tell my care team before I take this medication? They need to know if you have any of these conditions: Blood clots Coughing up blood Having or recent surgery Heart failure High blood pressure History of a connection between 2 or more body parts that do not usually connect (fistula) History of a tear in your stomach or intestines Protein in your urine An unusual or allergic reaction to bevacizumab, other medications, foods, dyes, or preservatives Pregnant or trying to get pregnant Breast-feeding How should I use this medication? This medication is injected into a vein. It is given by your care team in a hospital or clinic setting. Talk to your care team the use of this medication in children. Special care may be needed. Overdosage: If you think you have taken too much of this medicine contact a poison control center or emergency room at once. NOTE: This medicine is only for you. Do not share this medicine with others. What if I miss a dose? Keep appointments for follow-up doses. It is important not to miss your dose. Call your care team if you are unable to keep an appointment. What may interact with this medication? Interactions are not expected. This list may not describe all possible interactions. Give your  health care provider a list of all the medicines, herbs, non-prescription drugs, or dietary supplements you use. Also tell them if you smoke, drink alcohol, or use illegal drugs. Some items may interact with your medicine. What should I watch for while using this medication? Your condition will be monitored carefully while you are receiving this medication. You may need blood work while taking this medication. This medication may make you feel generally unwell. This is not uncommon as chemotherapy can affect healthy cells as well as cancer cells. Report any side effects. Continue your course of treatment even though you feel ill unless your care team tells you to stop. This medication may increase your risk to bruise or bleed. Call your care team if you notice any unusual bleeding. Before having surgery, talk to your care team to make sure it is ok. This medication can increase the risk of poor healing of your surgical site or wound. You will need to stop this medication for 28 days before surgery. After surgery, wait at least 28 days before restarting this medication. Make sure the surgical site or wound is healed enough before restarting  this medication. Talk to your care team if questions. Talk to your care team if you may be pregnant. Serious birth defects can occur if you take this medication during pregnancy and for 6 months after the last dose. Contraception is recommended while taking this medication and for 6 months after the last dose. Your care team can help you find the option that works for you. Do not breastfeed while taking this medication and for 6 months after the last dose. This medication can cause infertility. Talk to your care team if you are concerned about your fertility. What side effects may I notice from receiving this medication? Side effects that you should report to your care team as soon as possible: Allergic reactions--skin rash, itching, hives, swelling of the face, lips,  tongue, or throat Bleeding--bloody or black, tar-like stools, vomiting blood or brown material that looks like coffee grounds, red or dark brown urine, small red or purple spots on skin, unusual bruising or bleeding Blood clot--pain, swelling, or warmth in the leg, shortness of breath, chest pain Heart attack--pain or tightness in the chest, shoulders, arms, or jaw, nausea, shortness of breath, cold or clammy skin, feeling faint or lightheaded Heart failure--shortness of breath, swelling of the ankles, feet, or hands, sudden weight gain, unusual weakness or fatigue Increase in blood pressure Infection--fever, chills, cough, sore throat, wounds that don't heal, pain or trouble when passing urine, general feeling of discomfort or being unwell Infusion reactions--chest pain, shortness of breath or trouble breathing, feeling faint or lightheaded Kidney injury--decrease in the amount of urine, swelling of the ankles, hands, or feet Stomach pain that is severe, does not go away, or gets worse Stroke--sudden numbness or weakness of the face, arm, or leg, trouble speaking, confusion, trouble walking, loss of balance or coordination, dizziness, severe headache, change in vision Sudden and severe headache, confusion, change in vision, seizures, which may be signs of posterior reversible encephalopathy syndrome (PRES) Side effects that usually do not require medical attention (report to your care team if they continue or are bothersome): Back pain Change in taste Diarrhea Dry skin Increased tears Nosebleed This list may not describe all possible side effects. Call your doctor for medical advice about side effects. You may report side effects to FDA at 1-800-FDA-1088. Where should I keep my medication? This medication is given in a hospital or clinic. It will not be stored at home. NOTE: This sheet is a summary. It may not cover all possible information. If you have questions about this medicine, talk to  your doctor, pharmacist, or health care provider.  2023 Elsevier/Gold Standard (2021-12-16 00:00:00)  Oxaliplatin Injection What is this medication? OXALIPLATIN (ox AL i PLA tin) treats some types of cancer. It works by slowing down the growth of cancer cells. This medicine may be used for other purposes; ask your health care provider or pharmacist if you have questions. COMMON BRAND NAME(S): Eloxatin What should I tell my care team before I take this medication? They need to know if you have any of these conditions: Heart disease History of irregular heartbeat or rhythm Liver disease Low blood cell levels (white cells, red cells, and platelets) Lung or breathing disease, such as asthma Take medications that treat or prevent blood clots Tingling of the fingers, toes, or other nerve disorder An unusual or allergic reaction to oxaliplatin, other medications, foods, dyes, or preservatives If you or your partner are pregnant or trying to get pregnant Breast-feeding How should I use this medication? This medication  is injected into a vein. It is given by your care team in a hospital or clinic setting. Talk to your care team about the use of this medication in children. Special care may be needed. Overdosage: If you think you have taken too much of this medicine contact a poison control center or emergency room at once. NOTE: This medicine is only for you. Do not share this medicine with others. What if I miss a dose? Keep appointments for follow-up doses. It is important not to miss a dose. Call your care team if you are unable to keep an appointment. What may interact with this medication? Do not take this medication with any of the following: Cisapride Dronedarone Pimozide Thioridazine This medication may also interact with the following: Aspirin and aspirin-like medications Certain medications that treat or prevent blood clots, such as warfarin, apixaban, dabigatran, and  rivaroxaban Cisplatin Cyclosporine Diuretics Medications for infection, such as acyclovir, adefovir, amphotericin B, bacitracin, cidofovir, foscarnet, ganciclovir, gentamicin, pentamidine, vancomycin NSAIDs, medications for pain and inflammation, such as ibuprofen or naproxen Other medications that cause heart rhythm changes Pamidronate Zoledronic acid This list may not describe all possible interactions. Give your health care provider a list of all the medicines, herbs, non-prescription drugs, or dietary supplements you use. Also tell them if you smoke, drink alcohol, or use illegal drugs. Some items may interact with your medicine. What should I watch for while using this medication? Your condition will be monitored carefully while you are receiving this medication. You may need blood work while taking this medication. This medication may make you feel generally unwell. This is not uncommon as chemotherapy can affect healthy cells as well as cancer cells. Report any side effects. Continue your course of treatment even though you feel ill unless your care team tells you to stop. This medication may increase your risk of getting an infection. Call your care team for advice if you get a fever, chills, sore throat, or other symptoms of a cold or flu. Do not treat yourself. Try to avoid being around people who are sick. Avoid taking medications that contain aspirin, acetaminophen, ibuprofen, naproxen, or ketoprofen unless instructed by your care team. These medications may hide a fever. Be careful brushing or flossing your teeth or using a toothpick because you may get an infection or bleed more easily. If you have any dental work done, tell your dentist you are receiving this medication. This medication can make you more sensitive to cold. Do not drink cold drinks or use ice. Cover exposed skin before coming in contact with cold temperatures or cold objects. When out in cold weather wear warm clothing  and cover your mouth and nose to warm the air that goes into your lungs. Tell your care team if you get sensitive to the cold. Talk to your care team if you or your partner are pregnant or think either of you might be pregnant. This medication can cause serious birth defects if taken during pregnancy and for 9 months after the last dose. A negative pregnancy test is required before starting this medication. A reliable form of contraception is recommended while taking this medication and for 9 months after the last dose. Talk to your care team about effective forms of contraception. Do not father a child while taking this medication and for 6 months after the last dose. Use a condom while having sex during this time period. Do not breastfeed while taking this medication and for 3 months after the last  dose. This medication may cause infertility. Talk to your care team if you are concerned about your fertility. What side effects may I notice from receiving this medication? Side effects that you should report to your care team as soon as possible: Allergic reactions--skin rash, itching, hives, swelling of the face, lips, tongue, or throat Bleeding--bloody or black, tar-like stools, vomiting blood or brown material that looks like coffee grounds, red or dark brown urine, small red or purple spots on skin, unusual bruising or bleeding Dry cough, shortness of breath or trouble breathing Heart rhythm changes--fast or irregular heartbeat, dizziness, feeling faint or lightheaded, chest pain, trouble breathing Infection--fever, chills, cough, sore throat, wounds that don't heal, pain or trouble when passing urine, general feeling of discomfort or being unwell Liver injury--right upper belly pain, loss of appetite, nausea, light-colored stool, dark yellow or brown urine, yellowing skin or eyes, unusual weakness or fatigue Low red blood cell level--unusual weakness or fatigue, dizziness, headache, trouble  breathing Muscle injury--unusual weakness or fatigue, muscle pain, dark yellow or brown urine, decrease in amount of urine Pain, tingling, or numbness in the hands or feet Sudden and severe headache, confusion, change in vision, seizures, which may be signs of posterior reversible encephalopathy syndrome (PRES) Unusual bruising or bleeding Side effects that usually do not require medical attention (report to your care team if they continue or are bothersome): Diarrhea Nausea Pain, redness, or swelling with sores inside the mouth or throat Unusual weakness or fatigue Vomiting This list may not describe all possible side effects. Call your doctor for medical advice about side effects. You may report side effects to FDA at 1-800-FDA-1088. Where should I keep my medication? This medication is given in a hospital or clinic. It will not be stored at home. NOTE: This sheet is a summary. It may not cover all possible information. If you have questions about this medicine, talk to your doctor, pharmacist, or health care provider.  2023 Elsevier/Gold Standard (2007-10-05 00:00:00)  Leucovorin Injection What is this medication? LEUCOVORIN (loo koe VOR in) prevents side effects from certain medications, such as methotrexate. It works by increasing folate levels. This helps protect healthy cells in your body. It may also be used to treat anemia caused by low levels of folate. It can also be used with fluorouracil, a type of chemotherapy, to treat colorectal cancer. It works by increasing the effects of fluorouracil in the body. This medicine may be used for other purposes; ask your health care provider or pharmacist if you have questions. What should I tell my care team before I take this medication? They need to know if you have any of these conditions: Anemia from low levels of vitamin B12 in the blood An unusual or allergic reaction to leucovorin, folic acid, other medications, foods, dyes, or  preservatives Pregnant or trying to get pregnant Breastfeeding How should I use this medication? This medication is injected into a vein or a muscle. It is given by your care team in a hospital or clinic setting. Talk to your care team about the use of this medication in children. Special care may be needed. Overdosage: If you think you have taken too much of this medicine contact a poison control center or emergency room at once. NOTE: This medicine is only for you. Do not share this medicine with others. What if I miss a dose? Keep appointments for follow-up doses. It is important not to miss your dose. Call your care team if you are unable to  keep an appointment. What may interact with this medication? Capecitabine Fluorouracil Phenobarbital Phenytoin Primidone Trimethoprim;sulfamethoxazole This list may not describe all possible interactions. Give your health care provider a list of all the medicines, herbs, non-prescription drugs, or dietary supplements you use. Also tell them if you smoke, drink alcohol, or use illegal drugs. Some items may interact with your medicine. What should I watch for while using this medication? Your condition will be monitored carefully while you are receiving this medication. This medication may increase the side effects of 5-fluorouracil. Tell your care team if you have diarrhea or mouth sores that do not get better or that get worse. What side effects may I notice from receiving this medication? Side effects that you should report to your care team as soon as possible: Allergic reactions--skin rash, itching, hives, swelling of the face, lips, tongue, or throat This list may not describe all possible side effects. Call your doctor for medical advice about side effects. You may report side effects to FDA at 1-800-FDA-1088. Where should I keep my medication? This medication is given in a hospital or clinic. It will not be stored at home. NOTE: This sheet is  a summary. It may not cover all possible information. If you have questions about this medicine, talk to your doctor, pharmacist, or health care provider.  2023 Elsevier/Gold Standard (2022-01-17 00:00:00)  Fluorouracil Injection What is this medication? FLUOROURACIL (flure oh YOOR a sil) treats some types of cancer. It works by slowing down the growth of cancer cells. This medicine may be used for other purposes; ask your health care provider or pharmacist if you have questions. COMMON BRAND NAME(S): Adrucil What should I tell my care team before I take this medication? They need to know if you have any of these conditions: Blood disorders Dihydropyrimidine dehydrogenase (DPD) deficiency Infection, such as chickenpox, cold sores, herpes Kidney disease Liver disease Poor nutrition Recent or ongoing radiation therapy An unusual or allergic reaction to fluorouracil, other medications, foods, dyes, or preservatives If you or your partner are pregnant or trying to get pregnant Breast-feeding How should I use this medication? This medication is injected into a vein. It is administered by your care team in a hospital or clinic setting. Talk to your care team about the use of this medication in children. Special care may be needed. Overdosage: If you think you have taken too much of this medicine contact a poison control center or emergency room at once. NOTE: This medicine is only for you. Do not share this medicine with others. What if I miss a dose? Keep appointments for follow-up doses. It is important not to miss your dose. Call your care team if you are unable to keep an appointment. What may interact with this medication? Do not take this medication with any of the following: Live virus vaccines This medication may also interact with the following: Medications that treat or prevent blood clots, such as warfarin, enoxaparin, dalteparin This list may not describe all possible  interactions. Give your health care provider a list of all the medicines, herbs, non-prescription drugs, or dietary supplements you use. Also tell them if you smoke, drink alcohol, or use illegal drugs. Some items may interact with your medicine. What should I watch for while using this medication? Your condition will be monitored carefully while you are receiving this medication. This medication may make you feel generally unwell. This is not uncommon as chemotherapy can affect healthy cells as well as cancer cells. Report any  side effects. Continue your course of treatment even though you feel ill unless your care team tells you to stop. In some cases, you may be given additional medications to help with side effects. Follow all directions for their use. This medication may increase your risk of getting an infection. Call your care team for advice if you get a fever, chills, sore throat, or other symptoms of a cold or flu. Do not treat yourself. Try to avoid being around people who are sick. This medication may increase your risk to bruise or bleed. Call your care team if you notice any unusual bleeding. Be careful brushing or flossing your teeth or using a toothpick because you may get an infection or bleed more easily. If you have any dental work done, tell your dentist you are receiving this medication. Avoid taking medications that contain aspirin, acetaminophen, ibuprofen, naproxen, or ketoprofen unless instructed by your care team. These medications may hide a fever. Do not treat diarrhea with over the counter products. Contact your care team if you have diarrhea that lasts more than 2 days or if it is severe and watery. This medication can make you more sensitive to the sun. Keep out of the sun. If you cannot avoid being in the sun, wear protective clothing and sunscreen. Do not use sun lamps, tanning beds, or tanning booths. Talk to your care team if you or your partner wish to become pregnant  or think you might be pregnant. This medication can cause serious birth defects if taken during pregnancy and for 3 months after the last dose. A reliable form of contraception is recommended while taking this medication and for 3 months after the last dose. Talk to your care team about effective forms of contraception. Do not father a child while taking this medication and for 3 months after the last dose. Use a condom while having sex during this time period. Do not breastfeed while taking this medication. This medication may cause infertility. Talk to your care team if you are concerned about your fertility. What side effects may I notice from receiving this medication? Side effects that you should report to your care team as soon as possible: Allergic reactions--skin rash, itching, hives, swelling of the face, lips, tongue, or throat Heart attack--pain or tightness in the chest, shoulders, arms, or jaw, nausea, shortness of breath, cold or clammy skin, feeling faint or lightheaded Heart failure--shortness of breath, swelling of the ankles, feet, or hands, sudden weight gain, unusual weakness or fatigue Heart rhythm changes--fast or irregular heartbeat, dizziness, feeling faint or lightheaded, chest pain, trouble breathing High ammonia level--unusual weakness or fatigue, confusion, loss of appetite, nausea, vomiting, seizures Infection--fever, chills, cough, sore throat, wounds that don't heal, pain or trouble when passing urine, general feeling of discomfort or being unwell Low red blood cell level--unusual weakness or fatigue, dizziness, headache, trouble breathing Pain, tingling, or numbness in the hands or feet, muscle weakness, change in vision, confusion or trouble speaking, loss of balance or coordination, trouble walking, seizures Redness, swelling, and blistering of the skin over hands and feet Severe or prolonged diarrhea Unusual bruising or bleeding Side effects that usually do not  require medical attention (report to your care team if they continue or are bothersome): Dry skin Headache Increased tears Nausea Pain, redness, or swelling with sores inside the mouth or throat Sensitivity to light Vomiting This list may not describe all possible side effects. Call your doctor for medical advice about side effects. You may report  side effects to FDA at 1-800-FDA-1088. Where should I keep my medication? This medication is given in a hospital or clinic. It will not be stored at home. NOTE: This sheet is a summary. It may not cover all possible information. If you have questions about this medicine, talk to your doctor, pharmacist, or health care provider.  2023 Elsevier/Gold Standard (2021-12-13 00:00:00)

## 2022-10-23 NOTE — Progress Notes (Signed)
Patient seen by Dr. Sherrill today ? ?Vitals are within treatment parameters. ? ?Labs reviewed by Dr. Sherrill and are within treatment parameters. ? ?Per physician team, patient is ready for treatment and there are NO modifications to the treatment plan.  ?

## 2022-10-23 NOTE — Patient Instructions (Signed)

## 2022-10-23 NOTE — Progress Notes (Signed)
[facility] OFFICE PROGRESS NOTE   Diagnosis: Colon cancer  INTERVAL HISTORY:   [name] completed another cycle of FOLFOX on 10/09/2022.  He reports mild nausea following chemotherapy.  He had cold sensitivity during the week following chemotherapy.  No other neuropathy symptoms.  No mouth sores.  He feels well at present.  Objective:  Vital signs in last 24 hours:  Blood pressure 132/77, pulse 96, temperature 98.1 F (36.7 C), temperature source Oral, resp. rate 18, height '5\' 6"'$  (1.676 m), weight 244 lb 3.2 oz (110.8 kg), SpO2 100 %.    HEENT: No thrush or ulcers Resp: Lungs clear bilaterally Cardio: Regular rate and rhythm GI: No hepatosplenomegaly, no mass, mild tenderness in the mid upper abdomen and right subcostal region Vascular: No leg edema  Skin: Palms without erythema  Portacath/PICC-without erythema  Lab Results:  Lab Results  Component Value Date   WBC 4.8 10/23/2022   HGB 13.3 10/23/2022   HCT 41.7 10/23/2022   MCV 78.5 (L) 10/23/2022   PLT 146 (L) 10/23/2022   NEUTROABS 2.9 10/23/2022    CMP  Lab Results  Component Value Date   NA 135 10/23/2022   K 3.6 10/23/2022   CL 101 10/23/2022   CO2 25 10/23/2022   GLUCOSE 301 (H) 10/23/2022   BUN 10 10/23/2022   CREATININE 0.81 10/23/2022   CALCIUM 9.1 10/23/2022   PROT 7.3 10/23/2022   ALBUMIN 4.0 10/23/2022   AST 34 10/23/2022   ALT 35 10/23/2022   ALKPHOS 178 (H) 10/23/2022   BILITOT 0.8 10/23/2022   GFRNONAA >60 10/23/2022   GFRAA >60 05/10/2020    Lab Results  Component Value Date   CEA1 1.08 03/07/2021   CEA 120.80 (H) 09/25/2022     Medications: I have reviewed the patient's current medications.   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 secondary to nausea (patient discontinued Xeloda on day 11 due to colonoscopy) Colonoscopy 07/09/2020-nodular mucosa at the colonic anastomosis, biopsied; patent end-to-side ileocolonic anastomosis characterized by healthy-appearing mucosa and visible sutures; nonbleeding internal hemorrhoids, biopsy with benign ulcerated anastomotic mucosa with granulation tissue Cycle 4 adjuvant Xeloda 07/19/2020, 2000 mg every morning and 1500 mg every afternoon Cycle 5 adjuvant Xeloda 08/09/2020 Cycle 6 adjuvant Xeloda 08/31/2019 Cycle 7 adjuvant Xeloda 09/20/2020 Cycle 8 adjuvant Xeloda 10/11/2020 CTs 03/07/2021-no evidence of recurrent disease, hepatic steatosis, slight wall thickening at the junction of the descending and horizontal duodenum Mild elevation of CEA 2023 12/01/2021-Guardant Reveal-ct DNA detected PET 12/30/2021-hypermetabolic right liver lesion, hypermetabolic area in a loop of mid small bowel with an SUV of 12.5, review of PET images at GI tumor conference 01/11/2022-small bowel uptake felt to be a benign finding MRI liver 01/10/2022-solitary 1.2 cm right liver lesion between segments 7 and 8 compatible with metastasis, subtle changes suggesting cirrhosis, hepatic steatosis CT enteroscopy 02/02/2022-small bowel loop with hypermetabolic activity on PET continues to have a bandlike density along the margin felt to represent a diverticulum or adjacent nodal tissue.  Small bowel tumor not excluded.  3-4 mm left omental nodule 02/08/2022-biopsy and ablation of solitary liver  lesion, adenocarcinoma consistent with metastatic colorectal adenocarcinoma CTs 03/31/2022-unchanged circumstantial soft tissue thickening surrounding a loop of mid to distal small  bowel with an adjacent nodular focus measuring 1.2 cm.,  Ablation cavity in segment 7, no other evidence of metastatic disease Diagnostic laparoscopy 05/26/2022-mid small bowel mass-resected, well to moderately differentiated adenocarcinoma, 0/2 lymph nodes, morphology consistent with a colon primary, tumor involved full-thickness including serosa with perineural and large vessel involvement; foundation 1-microsatellite stable, tumor mutation burden 2, K-ras Q61H CT abdomen/pelvis 07/10/2022-the right liver ablation defect is smaller, new subtle right liver lesion measuring 13 x 9 mm, enlarged lymph node in the right upper quadrant mesentery adjacent to surgical anastomosis PET 07/26/2022-new segment 6 liver lesion, enlarging hypermetabolic left omental lesion, there is another mildly hypermetabolic peritoneal implant, 7 mm omental nodule at the midline without hypermetabolism Cycle 1 FOLFOX 09/25/2022 Cycle 2 FOLFOX/bevacizumab 10/09/2022, 5-FU dose reduced secondary to mucositis Cycle 3 FOLFOX/bevacizumab 10/23/2022 Microcytic anemia secondary to #1-improved Colon polyps-sessile serrated adenoma of the descending colon, tubular adenomas with focal high-grade dysplasia of the transverse colon on colonoscopy 02/12/2020, tubular adenoma of the mid ascending colon on the surgical specimen 04/12/2020 Family history of colon cancer Depression Anxiety/panic attacks Hypertension Hyperlipidemia Nausea secondary to Xeloda?-Resolved Gastroesophageal reflux disease-improved following discontinuation of Xeloda Anemia, microcytic; ferritin 6 01/24/2022; stool positive for blood x3 01/25/2022; 02/02/2022 CT abdomen/pelvis enterography-bowel loop demonstrating hypermetabolic activity continues to have a bandlike density along its margin possibly representing a diverticulum or adjacent nodal tissue, difficult to exclude small bowel tumor, 3 x 4 mm left omental nodule or lymph node, known right hepatic lobe tumor is occult on  CT. Venofer weekly x3 beginning 03/03/2022 Negative capsule endoscopy 03/09/2022 Venofer 03/03/2022 for 3 weekly doses Venofer 05/03/2022, 05/19/2022 Venofer 07/28/2022, 08/04/2022 12.  Anterior left neck nodule 03/17/2022-cyst?,  Lymph node? 13.  Mucositis secondary to chemotherapy at office visit 10/03/2022-the 5-FU will be dose reduced with cycle 2 FOLFOX      Disposition: Anthony Calhoun has metastatic colon cancer.  He has completed 2 cycles of FOLFOX/bevacizumab.  The CEA is lower.  His clinical status has improved.  He will complete cycle 3 today.  He will return for an office visit and chemotherapy in 2 weeks.  Sherlynn Stalls [place] will be referred for restaging CTs after cycle 5 FOLFOX/bevacizumab.  Betsy Coder, MD  10/23/2022  10:27 AM

## 2022-10-24 ENCOUNTER — Other Ambulatory Visit: Payer: Self-pay | Admitting: Genetic Counselor

## 2022-10-24 ENCOUNTER — Inpatient Hospital Stay (HOSPITAL_BASED_OUTPATIENT_CLINIC_OR_DEPARTMENT_OTHER): Payer: Medicaid Other | Admitting: Genetic Counselor

## 2022-10-24 ENCOUNTER — Telehealth: Payer: Self-pay

## 2022-10-24 DIAGNOSIS — Z8041 Family history of malignant neoplasm of ovary: Secondary | ICD-10-CM

## 2022-10-24 DIAGNOSIS — C787 Secondary malignant neoplasm of liver and intrahepatic bile duct: Secondary | ICD-10-CM

## 2022-10-24 DIAGNOSIS — Z801 Family history of malignant neoplasm of trachea, bronchus and lung: Secondary | ICD-10-CM

## 2022-10-24 DIAGNOSIS — Z800 Family history of malignant neoplasm of digestive organs: Secondary | ICD-10-CM

## 2022-10-24 DIAGNOSIS — C189 Malignant neoplasm of colon, unspecified: Secondary | ICD-10-CM

## 2022-10-24 NOTE — Telephone Encounter (Signed)
Patient is schedule to see Dr Rockwell Germany on 11/09/22. The only concerns he had this morning was he had a cold cup of chocolate milk, and cause his lip to freeze. It was very painful felt like  pins and needle. I advice the patient to made sure his liquids are room temperature. Patient gave verbal understanding and had no further questions or concerns.

## 2022-10-24 NOTE — Telephone Encounter (Signed)
-----   Message from Ladell Pier, MD sent at 10/23/2022  7:32 PM EST ----- Please call patient, his blood sugar has been high on multiple anterior, he needs to follow-up with Dr. Rockwell Germany to be evaluated for diabetes

## 2022-10-25 ENCOUNTER — Encounter: Payer: Self-pay | Admitting: Nurse Practitioner

## 2022-10-25 ENCOUNTER — Inpatient Hospital Stay: Payer: Medicaid Other

## 2022-10-25 ENCOUNTER — Encounter: Payer: Self-pay | Admitting: Oncology

## 2022-10-25 VITALS — BP 110/76 | HR 69 | Temp 98.1°F | Resp 18

## 2022-10-25 DIAGNOSIS — Z5111 Encounter for antineoplastic chemotherapy: Secondary | ICD-10-CM | POA: Diagnosis not present

## 2022-10-25 DIAGNOSIS — C189 Malignant neoplasm of colon, unspecified: Secondary | ICD-10-CM

## 2022-10-25 MED ORDER — HEPARIN SOD (PORK) LOCK FLUSH 100 UNIT/ML IV SOLN
500.0000 [IU] | Freq: Once | INTRAVENOUS | Status: AC | PRN
  Administered 2022-10-25: 500 [IU]

## 2022-10-25 MED ORDER — SODIUM CHLORIDE 0.9% FLUSH
10.0000 mL | INTRAVENOUS | Status: DC | PRN
  Administered 2022-10-25: 10 mL

## 2022-10-25 NOTE — Patient Instructions (Signed)

## 2022-10-26 ENCOUNTER — Inpatient Hospital Stay: Payer: Medicaid Other | Admitting: Nutrition

## 2022-10-26 ENCOUNTER — Encounter: Payer: Self-pay | Admitting: Genetic Counselor

## 2022-10-26 ENCOUNTER — Encounter: Payer: Self-pay | Admitting: Oncology

## 2022-10-26 ENCOUNTER — Encounter: Payer: Self-pay | Admitting: Nurse Practitioner

## 2022-10-26 NOTE — Progress Notes (Signed)
REFERRING PROVIDER: Ladell Pier, MD [place],  [place] [address]  PRIMARY PROVIDER:  Antony Contras, MD  PRIMARY REASON FOR VISIT:  1. Colon cancer metastasized to liver ([place])   2. Family history of colon cancer   3. Family history of lung cancer   4. Family history of ovarian cancer     HISTORY OF PRESENT ILLNESS:   Mr. Andolina, a 57 y.o. male, was seen for a [facility] cancer genetics consultation at the request of Dr. Benay Spice due to a personal history of cancer an a potential germline mutation detected by tumor testing.  Mr. Fenger presents to clinic today to discuss the possibility of a hereditary predisposition to cancer, to discuss genetic testing, and to further clarify his future cancer risks, as well as potential cancer risks for family members.   In April 10, 2020, at the age of 48, Mr. Iwanicki was diagnosed with adenocarcinoma of the colon (MMR protein intact and MSI-S).  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   CANCER HISTORY:  Oncology History  Colon cancer, ascending ([facility]  04/12/2020 Initial Diagnosis   Colon cancer, ascending ([facility]   05/10/2020 Cancer Staging   Staging form: Colon and Rectum, AJCC 8th Edition - Pathologic: Stage IIB (pT4a, pN0, cM0) - Signed by SLadell Pier MD on 05/10/2020   Colon cancer metastasized to liver ([facility]  02/08/2022 Initial Diagnosis   Colon cancer metastasized to liver ([facility]   09/25/2022 -  Chemotherapy   Patient is on Treatment Plan : COLORECTAL FOLFOX + Bevacizumab q14d         Past Medical History:  Diagnosis Date   Anemia    Anxiety    Arthritis    back,    Cancer ([facility]    cecum   Depression    Diabetes mellitus without complication ([place]    Dyspnea    started with anemia   GERD (gastroesophageal reflux disease)    History of kidney stones    Hypertension    Neuromuscular disorder  ([place]    carpal tunnel    Past Surgical History:  Procedure Laterality Date   APPENDECTOMY     BOWEL RESECTION N/A 05/26/2022   Procedure: SMALL BOWEL RESECTION;  Surgeon: TLeighton Ruff MD;  Location: [facility];  Service: General;  Laterality: N/A;   COLON SURGERY     COLONOSCOPY     CYSTOSCOPY W/ URETEROSCOPY W/ LITHOTRIPSY  1981   IR IMAGING GUIDED PORT INSERTION  09/21/2022   IR RADIOLOGIST EVAL & MGMT  01/17/2022   IR RADIOLOGIST EVAL & MGMT  03/07/2022   IR RADIOLOGIST EVAL & MGMT  07/13/2022   IR RADIOLOGIST EVAL & MGMT  09/07/2022   LAPAROSCOPY N/A 05/26/2022   Procedure: LAPAROSCOPY DIAGNOSTIC;  Surgeon: TLeighton Ruff MD;  Location: [facility];  Service: General;  Laterality: N/A;   RADIOLOGY WITH ANESTHESIA N/A 02/08/2022   Procedure: CT MICROWAVE ABLATION;  Surgeon: MCriselda Peaches MD;  Location: [facility];  Service: Radiology;  Laterality: N/A;    FAMILY HISTORY:  We obtained a detailed, 4-generation family history.  Significant diagnoses are listed below: Family History  Problem Relation Age of Onset   Leukemia Mother        dx 654s  Lung cancer Father        dx and d. 344s smoking hx   Ovarian cancer Maternal Aunt        d. after 564  Colon  cancer Maternal Uncle        mother's pat half brother; dx unknown age   Lung cancer Half-Brother        mat half brother     Mr. Devers is unaware of previous family history of genetic testing for hereditary cancer risks. There is no reported Ashkenazi Jewish ancestry. There is no known consanguinity.  GENETIC COUNSELING ASSESSMENT: Mr. Artrip is a 57 y.o. male with a personal and family history of Lynch-related cancers and a potential germline finding by somatic testing. We, therefore, discussed and recommended the following at today's visit.   DISCUSSION: We discussed that 5 - 10% of cancer is hereditary.  Most cases of hereditary colon cancer are associated with Lynch syndrome.  We reviewed the colon/uterine cancer  risks and management strategies associated with germline pathogenic variants in PMS2 given his potential germline finding by [facility].  There are other genes that can be associated with hereditary colon or ovarian cancer syndromes.  We discussed that testing is beneficial for several reasons including understanding if other family members could be at risk for cancer and allowing them to undergo genetic testing.   We reviewed the characteristics, features and inheritance patterns of hereditary cancer syndromes. We also discussed genetic testing, including the appropriate family members to test, the process of testing, insurance coverage and turn-around-time for results. We discussed the implications of a negative, positive, carrier and/or variant of uncertain significant result. We recommended Mr. Philibert pursue genetic testing for a panel that includes genes associated with colon, ovarian, and other cancers.   The CustomNext-Cancer+RNAinsight panel offered by [facility] includes sequencing, rearrangement, and RNA analysis for the following 47 genes:  APC, ATM, AXIN2, BAP1, BARD1, BMPR1A, BRCA1, BRCA2, BRIP1, CDH1, CDK4, CDKN2A, CHEK2, CTNNA1, DICER1, EGFR, EPCAM, FH, GREM1, HOXB13, KIT, MBD4, MEN1, MLH1, MSH2, MSH3, MSH6, MUTYH, NF1, NTHL1, PALB2, PDGFRA, PMS2, POLD1, POLE, PTEN, RAD51C, RAD51D, SDHA, SDHB, SDHC, SDHD, SMAD4, SMARCA4, STK11, TP53, TSC1, TSC2, VHL.  (Common Hereditary Cancer Genes + EGFR)  Based on Mr. Hirst's potential germline finding in PMS2 by somatic testing, he meets medical criteria for genetic testing.   PLAN: After considering the risks, benefits, and limitations, Mr. Curb provided informed consent to pursue genetic testing.  The blood sample will [facility] on 3/11 when other labs are being collected.  Results should be available in approximately 3 weeks after sample collection, at which point they will be disclosed by telephone  to Mr. Bobe. Mr. Contreraz will receive a summary of his genetic counseling visit and a copy of his results once available. This information will also be available in Epic.   Mr. Yabut's questions were answered to his satisfaction today. Our contact information was provided should additional questions or concerns arise. Thank you for the referral and allowing [place] to share in the care of your patient.   Marializ Ferrebee M. Joette Catching, [place], [facility] Genetic Counselor [email or web address] (P) [phone]  The patient was seen for less than 15 minutes of telephone-based genetic counseling.

## 2022-10-26 NOTE — Progress Notes (Signed)
Patient requested a call to answer questions about cold sensitivity after Oxaliplatin. He  reports he tried to drink a cool beverage and experienced pain. He is interested in learning more about warm alternatives.  Educated on warm beverages and other foods to supplement his oral intake. Patient is disappointed there isn't a cold food he can consume after treatment. Provided support and encouragement.

## 2022-10-27 ENCOUNTER — Inpatient Hospital Stay: Payer: Medicaid Other | Attending: Oncology

## 2022-10-27 DIAGNOSIS — D509 Iron deficiency anemia, unspecified: Secondary | ICD-10-CM | POA: Insufficient documentation

## 2022-10-27 DIAGNOSIS — K1231 Oral mucositis (ulcerative) due to antineoplastic therapy: Secondary | ICD-10-CM | POA: Insufficient documentation

## 2022-10-27 DIAGNOSIS — C182 Malignant neoplasm of ascending colon: Secondary | ICD-10-CM | POA: Insufficient documentation

## 2022-10-27 DIAGNOSIS — Z5111 Encounter for antineoplastic chemotherapy: Secondary | ICD-10-CM | POA: Insufficient documentation

## 2022-10-27 DIAGNOSIS — Z79899 Other long term (current) drug therapy: Secondary | ICD-10-CM | POA: Insufficient documentation

## 2022-10-27 NOTE — Progress Notes (Signed)
[facility] CSW Progress Note  Clinical Education officer, museum returned patient's call.  He said he was approved for the [employer], but wanted to make sure he had the correct information to provide the financial counselor.  CSW verified with Otilio Carpen.  Informed patient his gas cards were also available at the [facility].  Patient expressed no other needs at this time.    Rodman Pickle Briony Parveen, LCSW    Patient is participating in a Managed Medicaid Plan:  Yes

## 2022-10-29 ENCOUNTER — Encounter: Payer: Self-pay | Admitting: Oncology

## 2022-10-29 ENCOUNTER — Encounter: Payer: Self-pay | Admitting: Nurse Practitioner

## 2022-10-30 ENCOUNTER — Encounter: Payer: Self-pay | Admitting: Nurse Practitioner

## 2022-10-30 ENCOUNTER — Encounter: Payer: Self-pay | Admitting: Oncology

## 2022-10-31 ENCOUNTER — Telehealth: Payer: Self-pay | Admitting: Oncology

## 2022-10-31 NOTE — Telephone Encounter (Signed)
[facility] [employer] NOTES:  The balance amount to $2000 and put in admin notes that patient reapplied with approval date and was approved by L.Somers.

## 2022-11-02 ENCOUNTER — Encounter: Payer: Self-pay | Admitting: Oncology

## 2022-11-02 ENCOUNTER — Other Ambulatory Visit: Payer: Self-pay | Admitting: Oncology

## 2022-11-03 ENCOUNTER — Other Ambulatory Visit: Payer: Self-pay | Admitting: *Deleted

## 2022-11-03 MED ORDER — MAGIC MOUTHWASH: 5.0000 mL | Freq: Four times a day (QID) | ORAL | 1 refills | Status: DC | PRN

## 2022-11-03 MED ORDER — PANTOPRAZOLE SODIUM 40 MG PO TBEC: 40.0000 mg | DELAYED_RELEASE_TABLET | Freq: Every day | ORAL | 0 refills | Status: DC

## 2022-11-03 NOTE — Telephone Encounter (Signed)
Requesting refill on Protonix and MMW to [facility] since [facility] will not compound the MMW.

## 2022-11-05 ENCOUNTER — Other Ambulatory Visit: Payer: Self-pay | Admitting: Oncology

## 2022-11-06 ENCOUNTER — Inpatient Hospital Stay: Payer: Medicaid Other

## 2022-11-06 ENCOUNTER — Encounter: Payer: Self-pay | Admitting: Nurse Practitioner

## 2022-11-06 ENCOUNTER — Inpatient Hospital Stay: Payer: Medicaid Other | Admitting: Nurse Practitioner

## 2022-11-06 VITALS — BP 123/75 | HR 78

## 2022-11-06 VITALS — BP 128/68 | HR 86 | Temp 98.1°F | Resp 20 | Ht 66.0 in | Wt 241.0 lb

## 2022-11-06 DIAGNOSIS — C189 Malignant neoplasm of colon, unspecified: Secondary | ICD-10-CM | POA: Diagnosis not present

## 2022-11-06 DIAGNOSIS — C182 Malignant neoplasm of ascending colon: Secondary | ICD-10-CM | POA: Diagnosis present

## 2022-11-06 DIAGNOSIS — C787 Secondary malignant neoplasm of liver and intrahepatic bile duct: Secondary | ICD-10-CM | POA: Diagnosis not present

## 2022-11-06 DIAGNOSIS — K1231 Oral mucositis (ulcerative) due to antineoplastic therapy: Secondary | ICD-10-CM | POA: Diagnosis not present

## 2022-11-06 DIAGNOSIS — Z5111 Encounter for antineoplastic chemotherapy: Secondary | ICD-10-CM | POA: Diagnosis present

## 2022-11-06 DIAGNOSIS — D509 Iron deficiency anemia, unspecified: Secondary | ICD-10-CM | POA: Diagnosis present

## 2022-11-06 DIAGNOSIS — Z79899 Other long term (current) drug therapy: Secondary | ICD-10-CM | POA: Diagnosis not present

## 2022-11-06 LAB — CBC WITH DIFFERENTIAL (CANCER CENTER ONLY)
Abs Immature Granulocytes: 0.01 10*3/uL (ref 0.00–0.07)
Basophils Absolute: 0 10*3/uL (ref 0.0–0.1)
Basophils Relative: 1 %
Eosinophils Absolute: 0.2 10*3/uL (ref 0.0–0.5)
Eosinophils Relative: 5 %
HCT: 41.7 % (ref 39.0–52.0)
Hemoglobin: 13.6 g/dL (ref 13.0–17.0)
Immature Granulocytes: 0 %
Lymphocytes Relative: 22 %
Lymphs Abs: 1 10*3/uL (ref 0.7–4.0)
MCH: 25.7 pg — ABNORMAL LOW (ref 26.0–34.0)
MCHC: 32.6 g/dL (ref 30.0–36.0)
MCV: 78.8 fL — ABNORMAL LOW (ref 80.0–100.0)
Monocytes Absolute: 0.5 10*3/uL (ref 0.1–1.0)
Monocytes Relative: 12 %
Neutro Abs: 2.7 10*3/uL (ref 1.7–7.7)
Neutrophils Relative %: 60 %
Platelet Count: 116 10*3/uL — ABNORMAL LOW (ref 150–400)
RBC: 5.29 MIL/uL (ref 4.22–5.81)
RDW: 18.3 % — ABNORMAL HIGH (ref 11.5–15.5)
WBC Count: 4.4 10*3/uL (ref 4.0–10.5)
nRBC: 0 % (ref 0.0–0.2)

## 2022-11-06 LAB — CMP (CANCER CENTER ONLY)
ALT: 49 U/L — ABNORMAL HIGH (ref 0–44)
AST: 48 U/L — ABNORMAL HIGH (ref 15–41)
Albumin: 3.9 g/dL (ref 3.5–5.0)
Alkaline Phosphatase: 186 U/L — ABNORMAL HIGH (ref 38–126)
Anion gap: 7 (ref 5–15)
BUN: 7 mg/dL (ref 6–20)
CO2: 27 mmol/L (ref 22–32)
Calcium: 9.2 mg/dL (ref 8.9–10.3)
Chloride: 104 mmol/L (ref 98–111)
Creatinine: 0.69 mg/dL (ref 0.61–1.24)
GFR, Estimated: >60 mL/min (ref >60)
Glucose, Bld: 151 mg/dL — ABNORMAL HIGH (ref 70–99)
Potassium: 3.8 mmol/L (ref 3.5–5.1)
Sodium: 138 mmol/L (ref 135–145)
Total Bilirubin: 1 mg/dL (ref 0.3–1.2)
Total Protein: 7.4 g/dL (ref 6.5–8.1)

## 2022-11-06 LAB — CEA (ACCESS): CEA (CHCC): 46.89 ng/mL — ABNORMAL HIGH (ref 0.00–5.00)

## 2022-11-06 MED ORDER — PALONOSETRON HCL INJECTION 0.25 MG/5ML
0.2500 mg | Freq: Once | INTRAVENOUS | Status: AC
  Administered 2022-11-06: 0.25 mg via INTRAVENOUS
  Filled 2022-11-06: qty 5

## 2022-11-06 MED ORDER — LEUCOVORIN CALCIUM INJECTION 350 MG
200.0000 mg/m2 | Freq: Once | INTRAVENOUS | Status: AC
  Administered 2022-11-06: 448 mg via INTRAVENOUS
  Filled 2022-11-06: qty 22.4

## 2022-11-06 MED ORDER — SODIUM CHLORIDE 0.9 % IV SOLN
5.0000 mg/kg | Freq: Once | INTRAVENOUS | Status: AC
  Administered 2022-11-06: 500 mg via INTRAVENOUS
  Filled 2022-11-06: qty 16

## 2022-11-06 MED ORDER — OXALIPLATIN CHEMO INJECTION 100 MG/20ML
85.0000 mg/m2 | Freq: Once | INTRAVENOUS | Status: AC
  Administered 2022-11-06: 200 mg via INTRAVENOUS
  Filled 2022-11-06: qty 40

## 2022-11-06 MED ORDER — SODIUM CHLORIDE 0.9 % IV SOLN
10.0000 mg | Freq: Once | INTRAVENOUS | Status: AC
  Administered 2022-11-06: 10 mg via INTRAVENOUS
  Filled 2022-11-06: qty 1

## 2022-11-06 MED ORDER — FLUOROURACIL CHEMO INJECTION 500 MG/10ML
200.0000 mg/m2 | Freq: Once | INTRAVENOUS | Status: AC
  Administered 2022-11-06: 450 mg via INTRAVENOUS
  Filled 2022-11-06: qty 9

## 2022-11-06 MED ORDER — DEXTROSE 5 % IV SOLN: Freq: Once | INTRAVENOUS | Status: AC

## 2022-11-06 MED ORDER — SODIUM CHLORIDE 0.9 % IV SOLN
1600.0000 mg/m2 | INTRAVENOUS | Status: DC
  Administered 2022-11-06: 3500 mg via INTRAVENOUS
  Filled 2022-11-06: qty 70

## 2022-11-06 MED ORDER — SODIUM CHLORIDE 0.9 % IV SOLN: Freq: Once | INTRAVENOUS | Status: AC

## 2022-11-06 NOTE — Progress Notes (Signed)
Patient seen by Lisa Thomas NP today  Vitals are within treatment parameters.  Labs reviewed by Lisa Thomas NP and are within treatment parameters.  Per physician team, patient is ready for treatment and there are NO modifications to the treatment plan.     

## 2022-11-06 NOTE — Patient Instructions (Signed)
[facility]   The chemotherapy medication bag should finish at 46 hours, 96 hours, or 7 days. For example, if your pump is scheduled for 46 hours and it was put on at 4:00 p.m., it should finish at 2:00 p.m. the day it is scheduled to come off regardless of your appointment time.     Estimated time to finish at 1:30 Wednesday, November 08, 2022.   If the display on your pump reads "Low Volume" and it is beeping, take the batteries out of the pump and come to the cancer center for it to be taken off.   If the pump alarms go off prior to the pump reading "Low Volume" then call [phone] and someone can assist you.  If the plunger comes out and the chemotherapy medication is leaking out, please use your home chemo spill kit to clean up the spill. Do NOT use paper towels or other household products.  If you have problems or questions regarding your pump, please call either [phone] (24 hours a day) or the cancer center Monday-Friday 8:00 a.m.- 4:30 p.m. at the clinic number and we will assist you. If you are unable to get assistance, then go to the nearest Emergency Department and ask the staff to contact the IV team for assistance.  Discharge Instructions: Thank you for choosing [facility] to provide your oncology and hematology care.   If you have a lab appointment with the [facility], please go directly to the [facility] and check in at the registration area.   Wear comfortable clothing and clothing appropriate for easy access to any Portacath or PICC line.   We strive to give you quality time with your provider. You may need to reschedule your appointment if you arrive late (15 or more minutes).  Arriving late affects you and other patients whose appointments are after yours.  Also, if you miss three or more appointments without notifying the office, you may be dismissed from the clinic at the provider's discretion.      For  prescription refill requests, have your pharmacy contact our office and allow 72 hours for refills to be completed.    Today you received the following chemotherapy and/or immunotherapy agents Bevacizumab, Oxaliplatin, Leucovorin, Fluorouracil.      To help prevent nausea and vomiting after your treatment, we encourage you to take your nausea medication as directed.  BELOW ARE SYMPTOMS THAT SHOULD BE REPORTED IMMEDIATELY: *FEVER GREATER THAN 100.4 F (38 C) OR HIGHER *CHILLS OR SWEATING *NAUSEA AND VOMITING THAT IS NOT CONTROLLED WITH YOUR NAUSEA MEDICATION *UNUSUAL SHORTNESS OF BREATH *UNUSUAL BRUISING OR BLEEDING *URINARY PROBLEMS (pain or burning when urinating, or frequent urination) *BOWEL PROBLEMS (unusual diarrhea, constipation, pain near the anus) TENDERNESS IN MOUTH AND THROAT WITH OR WITHOUT PRESENCE OF ULCERS (sore throat, sores in mouth, or a toothache) UNUSUAL RASH, SWELLING OR PAIN  UNUSUAL VAGINAL DISCHARGE OR ITCHING   Items with * indicate a potential emergency and should be followed up as soon as possible or go to the Emergency Department if any problems should occur.  Please show the CHEMOTHERAPY ALERT CARD or IMMUNOTHERAPY ALERT CARD at check-in to the Emergency Department and triage nurse.  Should you have questions after your visit or need to cancel or reschedule your appointment, please contact [facility]  Dept: [phone]  and follow the prompts.  Office hours are 8:00 a.m. to 4:30 p.m. Monday - Friday. Please  note that voicemails left after 4:00 p.m. may not be returned until the following business day.  We are closed weekends and major holidays. You have access to a nurse at all times for urgent questions. Please call the main number to the clinic Dept: [phone] and follow the prompts.   For any non-urgent questions, you may also contact your provider using MyChart. We now offer e-Visits for anyone 6 and older to request  care online for non-urgent symptoms. For details visit [email or web address].   Also download the MyChart app! Go to the app store, search "MyChart", open the app, select [facility], and log in with your MyChart username and password.  Bevacizumab Injection What is this medication? BEVACIZUMAB (be va SIZ yoo mab) treats some types of cancer. It works by blocking a protein that causes cancer cells to grow and multiply. This helps to slow or stop the spread of cancer cells. It is a monoclonal antibody. This medicine may be used for other purposes; ask your health care provider or pharmacist if you have questions. COMMON BRAND NAME(S): Alymsys, Avastin, MVASI, Noah Charon What should I tell my care team before I take this medication? They need to know if you have any of these conditions: Blood clots Coughing up blood Having or recent surgery Heart failure High blood pressure History of a connection between 2 or more body parts that do not usually connect (fistula) History of a tear in your stomach or intestines Protein in your urine An unusual or allergic reaction to bevacizumab, other medications, foods, dyes, or preservatives Pregnant or trying to get pregnant Breast-feeding How should I use this medication? This medication is injected into a vein. It is given by your care team in a hospital or clinic setting. Talk to your care team the use of this medication in children. Special care may be needed. Overdosage: If you think you have taken too much of this medicine contact a poison control center or emergency room at once. NOTE: This medicine is only for you. Do not share this medicine with others. What if I miss a dose? Keep appointments for follow-up doses. It is important not to miss your dose. Call your care team if you are unable to keep an appointment. What may interact with this medication? Interactions are not expected. This list may not describe all possible interactions. Give  your health care provider a list of all the medicines, herbs, non-prescription drugs, or dietary supplements you use. Also tell them if you smoke, drink alcohol, or use illegal drugs. Some items may interact with your medicine. What should I watch for while using this medication? Your condition will be monitored carefully while you are receiving this medication. You may need blood work while taking this medication. This medication may make you feel generally unwell. This is not uncommon as chemotherapy can affect healthy cells as well as cancer cells. Report any side effects. Continue your course of treatment even though you feel ill unless your care team tells you to stop. This medication may increase your risk to bruise or bleed. Call your care team if you notice any unusual bleeding. Before having surgery, talk to your care team to make sure it is ok. This medication can increase the risk of poor healing of your surgical site or wound. You will need to stop this medication for 28 days before surgery. After surgery, wait at least 28 days before restarting this medication. Make sure the surgical site or wound is healed enough before restarting  this medication. Talk to your care team if questions. Talk to your care team if you may be pregnant. Serious birth defects can occur if you take this medication during pregnancy and for 6 months after the last dose. Contraception is recommended while taking this medication and for 6 months after the last dose. Your care team can help you find the option that works for you. Do not breastfeed while taking this medication and for 6 months after the last dose. This medication can cause infertility. Talk to your care team if you are concerned about your fertility. What side effects may I notice from receiving this medication? Side effects that you should report to your care team as soon as possible: Allergic reactions--skin rash, itching, hives, swelling of the face,  lips, tongue, or throat Bleeding--bloody or black, tar-like stools, vomiting blood or brown material that looks like coffee grounds, red or dark brown urine, small red or purple spots on skin, unusual bruising or bleeding Blood clot--pain, swelling, or warmth in the leg, shortness of breath, chest pain Heart attack--pain or tightness in the chest, shoulders, arms, or jaw, nausea, shortness of breath, cold or clammy skin, feeling faint or lightheaded Heart failure--shortness of breath, swelling of the ankles, feet, or hands, sudden weight gain, unusual weakness or fatigue Increase in blood pressure Infection--fever, chills, cough, sore throat, wounds that don't heal, pain or trouble when passing urine, general feeling of discomfort or being unwell Infusion reactions--chest pain, shortness of breath or trouble breathing, feeling faint or lightheaded Kidney injury--decrease in the amount of urine, swelling of the ankles, hands, or feet Stomach pain that is severe, does not go away, or gets worse Stroke--sudden numbness or weakness of the face, arm, or leg, trouble speaking, confusion, trouble walking, loss of balance or coordination, dizziness, severe headache, change in vision Sudden and severe headache, confusion, change in vision, seizures, which may be signs of posterior reversible encephalopathy syndrome (PRES) Side effects that usually do not require medical attention (report to your care team if they continue or are bothersome): Back pain Change in taste Diarrhea Dry skin Increased tears Nosebleed This list may not describe all possible side effects. Call your doctor for medical advice about side effects. You may report side effects to FDA at 1-800-FDA-1088. Where should I keep my medication? This medication is given in a hospital or clinic. It will not be stored at home. NOTE: This sheet is a summary. It may not cover all possible information. If you have questions about this medicine,  talk to your doctor, pharmacist, or health care provider.  2023 Elsevier/Gold Standard (2021-12-16 00:00:00)  Oxaliplatin Injection What is this medication? OXALIPLATIN (ox AL i PLA tin) treats colorectal cancer. It works by slowing down the growth of cancer cells. This medicine may be used for other purposes; ask your health care provider or pharmacist if you have questions. COMMON BRAND NAME(S): Eloxatin What should I tell my care team before I take this medication? They need to know if you have any of these conditions: Heart disease History of irregular heartbeat or rhythm Liver disease Low blood cell levels (white cells, red cells, and platelets) Lung or breathing disease, such as asthma Take medications that treat or prevent blood clots Tingling of the fingers, toes, or other nerve disorder An unusual or allergic reaction to oxaliplatin, other medications, foods, dyes, or preservatives If you or your partner are pregnant or trying to get pregnant Breast-feeding How should I use this medication? This medication is injected  into a vein. It is given by your care team in a hospital or clinic setting. Talk to your care team about the use of this medication in children. Special care may be needed. Overdosage: If you think you have taken too much of this medicine contact a poison control center or emergency room at once. NOTE: This medicine is only for you. Do not share this medicine with others. What if I miss a dose? Keep appointments for follow-up doses. It is important not to miss a dose. Call your care team if you are unable to keep an appointment. What may interact with this medication? Do not take this medication with any of the following: Cisapride Dronedarone Pimozide Thioridazine This medication may also interact with the following: Aspirin and aspirin-like medications Certain medications that treat or prevent blood clots, such as warfarin, apixaban, dabigatran, and  rivaroxaban Cisplatin Cyclosporine Diuretics Medications for infection, such as acyclovir, adefovir, amphotericin B, bacitracin, cidofovir, foscarnet, ganciclovir, gentamicin, pentamidine, vancomycin NSAIDs, medications for pain and inflammation, such as ibuprofen or naproxen Other medications that cause heart rhythm changes Pamidronate Zoledronic acid This list may not describe all possible interactions. Give your health care provider a list of all the medicines, herbs, non-prescription drugs, or dietary supplements you use. Also tell them if you smoke, drink alcohol, or use illegal drugs. Some items may interact with your medicine. What should I watch for while using this medication? Your condition will be monitored carefully while you are receiving this medication. You may need blood work while taking this medication. This medication may make you feel generally unwell. This is not uncommon as chemotherapy can affect healthy cells as well as cancer cells. Report any side effects. Continue your course of treatment even though you feel ill unless your care team tells you to stop. This medication may increase your risk of getting an infection. Call your care team for advice if you get a fever, chills, sore throat, or other symptoms of a cold or flu. Do not treat yourself. Try to avoid being around people who are sick. Avoid taking medications that contain aspirin, acetaminophen, ibuprofen, naproxen, or ketoprofen unless instructed by your care team. These medications may hide a fever. Be careful brushing or flossing your teeth or using a toothpick because you may get an infection or bleed more easily. If you have any dental work done, tell your dentist you are receiving this medication. This medication can make you more sensitive to cold. Do not drink cold drinks or use ice. Cover exposed skin before coming in contact with cold temperatures or cold objects. When out in cold weather wear warm clothing  and cover your mouth and nose to warm the air that goes into your lungs. Tell your care team if you get sensitive to the cold. Talk to your care team if you or your partner are pregnant or think either of you might be pregnant. This medication can cause serious birth defects if taken during pregnancy and for 9 months after the last dose. A negative pregnancy test is required before starting this medication. A reliable form of contraception is recommended while taking this medication and for 9 months after the last dose. Talk to your care team about effective forms of contraception. Do not father a child while taking this medication and for 6 months after the last dose. Use a condom while having sex during this time period. Do not breastfeed while taking this medication and for 3 months after the last dose. This  medication may cause infertility. Talk to your care team if you are concerned about your fertility. What side effects may I notice from receiving this medication? Side effects that you should report to your care team as soon as possible: Allergic reactions--skin rash, itching, hives, swelling of the face, lips, tongue, or throat Bleeding--bloody or black, tar-like stools, vomiting blood or brown material that looks like coffee grounds, red or dark brown urine, small red or purple spots on skin, unusual bruising or bleeding Dry cough, shortness of breath or trouble breathing Heart rhythm changes--fast or irregular heartbeat, dizziness, feeling faint or lightheaded, chest pain, trouble breathing Infection--fever, chills, cough, sore throat, wounds that don't heal, pain or trouble when passing urine, general feeling of discomfort or being unwell Liver injury--right upper belly pain, loss of appetite, nausea, light-colored stool, dark yellow or brown urine, yellowing skin or eyes, unusual weakness or fatigue Low red blood cell level--unusual weakness or fatigue, dizziness, headache, trouble  breathing Muscle injury--unusual weakness or fatigue, muscle pain, dark yellow or brown urine, decrease in amount of urine Pain, tingling, or numbness in the hands or feet Sudden and severe headache, confusion, change in vision, seizures, which may be signs of posterior reversible encephalopathy syndrome (PRES) Unusual bruising or bleeding Side effects that usually do not require medical attention (report to your care team if they continue or are bothersome): Diarrhea Nausea Pain, redness, or swelling with sores inside the mouth or throat Unusual weakness or fatigue Vomiting This list may not describe all possible side effects. Call your doctor for medical advice about side effects. You may report side effects to FDA at 1-800-FDA-1088. Where should I keep my medication? This medication is given in a hospital or clinic. It will not be stored at home. NOTE: This sheet is a summary. It may not cover all possible information. If you have questions about this medicine, talk to your doctor, pharmacist, or health care provider.  2023 Elsevier/Gold Standard (2007-10-05 00:00:00)  Leucovorin Injection What is this medication? LEUCOVORIN (loo koe VOR in) prevents side effects from certain medications, such as methotrexate. It works by increasing folate levels. This helps protect healthy cells in your body. It may also be used to treat anemia caused by low levels of folate. It can also be used with fluorouracil, a type of chemotherapy, to treat colorectal cancer. It works by increasing the effects of fluorouracil in the body. This medicine may be used for other purposes; ask your health care provider or pharmacist if you have questions. What should I tell my care team before I take this medication? They need to know if you have any of these conditions: Anemia from low levels of vitamin B12 in the blood An unusual or allergic reaction to leucovorin, folic acid, other medications, foods, dyes, or  preservatives Pregnant or trying to get pregnant Breastfeeding How should I use this medication? This medication is injected into a vein or a muscle. It is given by your care team in a hospital or clinic setting. Talk to your care team about the use of this medication in children. Special care may be needed. Overdosage: If you think you have taken too much of this medicine contact a poison control center or emergency room at once. NOTE: This medicine is only for you. Do not share this medicine with others. What if I miss a dose? Keep appointments for follow-up doses. It is important not to miss your dose. Call your care team if you are unable to keep an  appointment. What may interact with this medication? Capecitabine Fluorouracil Phenobarbital Phenytoin Primidone Trimethoprim;sulfamethoxazole This list may not describe all possible interactions. Give your health care provider a list of all the medicines, herbs, non-prescription drugs, or dietary supplements you use. Also tell them if you smoke, drink alcohol, or use illegal drugs. Some items may interact with your medicine. What should I watch for while using this medication? Your condition will be monitored carefully while you are receiving this medication. This medication may increase the side effects of 5-fluorouracil. Tell your care team if you have diarrhea or mouth sores that do not get better or that get worse. What side effects may I notice from receiving this medication? Side effects that you should report to your care team as soon as possible: Allergic reactions--skin rash, itching, hives, swelling of the face, lips, tongue, or throat This list may not describe all possible side effects. Call your doctor for medical advice about side effects. You may report side effects to FDA at 1-800-FDA-1088. Where should I keep my medication? This medication is given in a hospital or clinic. It will not be stored at home. NOTE: This sheet is  a summary. It may not cover all possible information. If you have questions about this medicine, talk to your doctor, pharmacist, or health care provider.  2023 Elsevier/Gold Standard (2021-12-23 00:00:00)  Fluorouracil Injection What is this medication? FLUOROURACIL (flure oh YOOR a sil) treats some types of cancer. It works by slowing down the growth of cancer cells. This medicine may be used for other purposes; ask your health care provider or pharmacist if you have questions. COMMON BRAND NAME(S): Adrucil What should I tell my care team before I take this medication? They need to know if you have any of these conditions: Blood disorders Dihydropyrimidine dehydrogenase (DPD) deficiency Infection, such as chickenpox, cold sores, herpes Kidney disease Liver disease Poor nutrition Recent or ongoing radiation therapy An unusual or allergic reaction to fluorouracil, other medications, foods, dyes, or preservatives If you or your partner are pregnant or trying to get pregnant Breast-feeding How should I use this medication? This medication is injected into a vein. It is administered by your care team in a hospital or clinic setting. Talk to your care team about the use of this medication in children. Special care may be needed. Overdosage: If you think you have taken too much of this medicine contact a poison control center or emergency room at once. NOTE: This medicine is only for you. Do not share this medicine with others. What if I miss a dose? Keep appointments for follow-up doses. It is important not to miss your dose. Call your care team if you are unable to keep an appointment. What may interact with this medication? Do not take this medication with any of the following: Live virus vaccines This medication may also interact with the following: Medications that treat or prevent blood clots, such as warfarin, enoxaparin, dalteparin This list may not describe all possible  interactions. Give your health care provider a list of all the medicines, herbs, non-prescription drugs, or dietary supplements you use. Also tell them if you smoke, drink alcohol, or use illegal drugs. Some items may interact with your medicine. What should I watch for while using this medication? Your condition will be monitored carefully while you are receiving this medication. This medication may make you feel generally unwell. This is not uncommon as chemotherapy can affect healthy cells as well as cancer cells. Report any side effects.  Continue your course of treatment even though you feel ill unless your care team tells you to stop. In some cases, you may be given additional medications to help with side effects. Follow all directions for their use. This medication may increase your risk of getting an infection. Call your care team for advice if you get a fever, chills, sore throat, or other symptoms of a cold or flu. Do not treat yourself. Try to avoid being around people who are sick. This medication may increase your risk to bruise or bleed. Call your care team if you notice any unusual bleeding. Be careful brushing or flossing your teeth or using a toothpick because you may get an infection or bleed more easily. If you have any dental work done, tell your dentist you are receiving this medication. Avoid taking medications that contain aspirin, acetaminophen, ibuprofen, naproxen, or ketoprofen unless instructed by your care team. These medications may hide a fever. Do not treat diarrhea with over the counter products. Contact your care team if you have diarrhea that lasts more than 2 days or if it is severe and watery. This medication can make you more sensitive to the sun. Keep out of the sun. If you cannot avoid being in the sun, wear protective clothing and sunscreen. Do not use sun lamps, tanning beds, or tanning booths. Talk to your care team if you or your partner wish to become pregnant  or think you might be pregnant. This medication can cause serious birth defects if taken during pregnancy and for 3 months after the last dose. A reliable form of contraception is recommended while taking this medication and for 3 months after the last dose. Talk to your care team about effective forms of contraception. Do not father a child while taking this medication and for 3 months after the last dose. Use a condom while having sex during this time period. Do not breastfeed while taking this medication. This medication may cause infertility. Talk to your care team if you are concerned about your fertility. What side effects may I notice from receiving this medication? Side effects that you should report to your care team as soon as possible: Allergic reactions--skin rash, itching, hives, swelling of the face, lips, tongue, or throat Heart attack--pain or tightness in the chest, shoulders, arms, or jaw, nausea, shortness of breath, cold or clammy skin, feeling faint or lightheaded Heart failure--shortness of breath, swelling of the ankles, feet, or hands, sudden weight gain, unusual weakness or fatigue Heart rhythm changes--fast or irregular heartbeat, dizziness, feeling faint or lightheaded, chest pain, trouble breathing High ammonia level--unusual weakness or fatigue, confusion, loss of appetite, nausea, vomiting, seizures Infection--fever, chills, cough, sore throat, wounds that don't heal, pain or trouble when passing urine, general feeling of discomfort or being unwell Low red blood cell level--unusual weakness or fatigue, dizziness, headache, trouble breathing Pain, tingling, or numbness in the hands or feet, muscle weakness, change in vision, confusion or trouble speaking, loss of balance or coordination, trouble walking, seizures Redness, swelling, and blistering of the skin over hands and feet Severe or prolonged diarrhea Unusual bruising or bleeding Side effects that usually do not  require medical attention (report to your care team if they continue or are bothersome): Dry skin Headache Increased tears Nausea Pain, redness, or swelling with sores inside the mouth or throat Sensitivity to light Vomiting This list may not describe all possible side effects. Call your doctor for medical advice about side effects. You may report side effects  to FDA at 1-800-FDA-1088. Where should I keep my medication? This medication is given in a hospital or clinic. It will not be stored at home. NOTE: This sheet is a summary. It may not cover all possible information. If you have questions about this medicine, talk to your doctor, pharmacist, or health care provider.  2023 Elsevier/Gold Standard (2021-12-13 00:00:00)

## 2022-11-06 NOTE — Progress Notes (Signed)
[facility] OFFICE PROGRESS NOTE   Diagnosis: Colon cancer  INTERVAL HISTORY:   Mr. Kotz returns as scheduled.  He completed cycle 3 FOLFOX/bevacizumab 10/23/2022.  He denies vomiting.  He has mild intermittent nausea.  No mouth sores.  He has periodic loose stools.  He occasionally notes cold sensitivity affecting his lips.  Objective:  Vital signs in last 24 hours:  Blood pressure 128/68, pulse 86, temperature 98.1 F (36.7 C), temperature source Oral, resp. rate 20, height '5\' 6"'$  (1.676 m), weight 241 lb (109.3 kg), SpO2 98 %.    HEENT: No thrush or ulcers. Resp: Lungs clear bilaterally. Cardio: Regular rate and rhythm. GI: Abdomen soft and nontender.  No hepatomegaly. Vascular: No leg edema. Neuro: Vibratory sense intact over the fingertips per tuning fork exam. Skin: Palms without erythema. Portacath without erythema.  Lab Results:  Lab Results  Component Value Date   WBC 4.4 11/06/2022   HGB 13.6 11/06/2022   HCT 41.7 11/06/2022   MCV 78.8 (L) 11/06/2022   PLT 116 (L) 11/06/2022   NEUTROABS 2.7 11/06/2022    Imaging:  No results found.  Medications: I have reviewed the patient's current medications.  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 secondary to nausea (patient discontinued Xeloda on day 11 due to colonoscopy) Colonoscopy 07/09/2020-nodular mucosa at the colonic anastomosis, biopsied; patent end-to-side ileocolonic anastomosis characterized by  healthy-appearing mucosa and visible sutures; nonbleeding internal hemorrhoids, biopsy with benign ulcerated anastomotic mucosa with granulation tissue Cycle 4 adjuvant Xeloda 07/19/2020, 2000 mg every morning and 1500 mg every afternoon Cycle 5 adjuvant Xeloda 08/09/2020 Cycle 6 adjuvant Xeloda 08/31/2019 Cycle 7 adjuvant Xeloda 09/20/2020 Cycle 8 adjuvant Xeloda 10/11/2020 CTs 03/07/2021-no evidence of recurrent disease, hepatic steatosis, slight wall thickening at the junction of the descending and horizontal duodenum Mild elevation of CEA 2023 12/01/2021-Guardant Reveal-ct DNA detected PET 12/30/2021-hypermetabolic right liver lesion, hypermetabolic area in a loop of mid small bowel with an SUV of 12.5, review of PET images at GI tumor conference 01/11/2022-small bowel uptake felt to be a benign finding MRI liver 01/10/2022-solitary 1.2 cm right liver lesion between segments 7 and 8 compatible with metastasis, subtle changes suggesting cirrhosis, hepatic steatosis CT enteroscopy 02/02/2022-small bowel loop with hypermetabolic activity on PET continues to have a bandlike density along the margin felt to represent a diverticulum or adjacent nodal tissue.  Small bowel tumor not excluded.  3-4 mm left omental nodule 02/08/2022-biopsy and ablation of solitary liver lesion, adenocarcinoma consistent with metastatic colorectal adenocarcinoma CTs 03/31/2022-unchanged circumstantial soft tissue thickening surrounding a loop of mid to distal small bowel with an adjacent nodular focus measuring 1.2 cm.,  Ablation cavity in segment 7, no other evidence of metastatic disease Diagnostic laparoscopy 05/26/2022-mid small bowel mass-resected, well to moderately differentiated adenocarcinoma, 0/2 lymph nodes, morphology consistent with a colon primary, tumor involved full-thickness including serosa with perineural and large vessel involvement; foundation 1-microsatellite stable, tumor mutation burden 2, K-ras Q61H CT abdomen/pelvis  07/10/2022-the right liver ablation defect is smaller, new subtle right liver lesion measuring 13 x 9 mm, enlarged lymph node in the right upper  quadrant mesentery adjacent to surgical anastomosis PET 07/26/2022-new segment 6 liver lesion, enlarging hypermetabolic left omental lesion, there is another mildly hypermetabolic peritoneal implant, 7 mm omental nodule at the midline without hypermetabolism Cycle 1 FOLFOX 09/25/2022 Cycle 2 FOLFOX/bevacizumab 10/09/2022, 5-FU dose reduced secondary to mucositis Cycle 3 FOLFOX/bevacizumab 10/23/2022 Cycle 4 FOLFOX/bevacizumab 11/06/2022 Microcytic anemia secondary to #1-improved Colon polyps-sessile serrated adenoma of the descending colon, tubular adenomas with focal high-grade dysplasia of the transverse colon on colonoscopy 02/12/2020, tubular adenoma of the mid ascending colon on the surgical specimen 04/12/2020 Family history of colon cancer Depression Anxiety/panic attacks Hypertension Hyperlipidemia Nausea secondary to Xeloda?-Resolved Gastroesophageal reflux disease-improved following discontinuation of Xeloda Anemia, microcytic; ferritin 6 01/24/2022; stool positive for blood x3 01/25/2022; 02/02/2022 CT abdomen/pelvis enterography-bowel loop demonstrating hypermetabolic activity continues to have a bandlike density along its margin possibly representing a diverticulum or adjacent nodal tissue, difficult to exclude small bowel tumor, 3 x 4 mm left omental nodule or lymph node, known right hepatic lobe tumor is occult on CT. Venofer weekly x3 beginning 03/03/2022 Negative capsule endoscopy 03/09/2022 Venofer 03/03/2022 for 3 weekly doses Venofer 05/03/2022, 05/19/2022 Venofer 07/28/2022, 08/04/2022 12.  Anterior left neck nodule 03/17/2022-cyst?,  Lymph node? 13.  Mucositis secondary to chemotherapy at office visit 10/03/2022-the 5-FU will be dose reduced with cycle 2 FOLFOX      Disposition: Mr. Zuege appears stable.  He has completed 3 cycles of  FOLFOX/bevacizumab.  Overall he is tolerating well.  Plan to proceed with cycle 4 today as scheduled.  CBC and chemistry panel reviewed.  Labs adequate to proceed as above.  Transaminases mildly elevated.  This may be related to oxaliplatin.  Continue to monitor.  CEA is better.  He will return for lab, follow-up, cycle 5 FOLFOX/bevacizumab in 2 weeks.  Restaging CTs after cycle 5.    Ned Card ANP/GNP-BC   11/06/2022  10:50 AM

## 2022-11-07 ENCOUNTER — Encounter: Payer: Self-pay | Admitting: Nutrition

## 2022-11-07 ENCOUNTER — Telehealth: Payer: Self-pay | Admitting: Genetic Counselor

## 2022-11-07 LAB — GENETIC SCREENING ORDER

## 2022-11-07 NOTE — Progress Notes (Signed)
Provided one complimentary case of Ensure Plus HP for patient to pick up on Wednesday, March 13.

## 2022-11-07 NOTE — Telephone Encounter (Signed)
Answered patient questions about billing and medicaid coverage for genetic testing.  Also answered questions about what a positive result in genetic testing would mean for him and his family.

## 2022-11-08 ENCOUNTER — Telehealth: Payer: Self-pay

## 2022-11-08 ENCOUNTER — Inpatient Hospital Stay: Payer: Medicaid Other

## 2022-11-08 VITALS — BP 117/69 | HR 83 | Temp 98.2°F | Resp 18

## 2022-11-08 DIAGNOSIS — Z5111 Encounter for antineoplastic chemotherapy: Secondary | ICD-10-CM | POA: Diagnosis not present

## 2022-11-08 DIAGNOSIS — C189 Malignant neoplasm of colon, unspecified: Secondary | ICD-10-CM

## 2022-11-08 MED ORDER — HEPARIN SOD (PORK) LOCK FLUSH 100 UNIT/ML IV SOLN
500.0000 [IU] | Freq: Once | INTRAVENOUS | Status: AC | PRN
  Administered 2022-11-08: 500 [IU]

## 2022-11-08 MED ORDER — SODIUM CHLORIDE 0.9% FLUSH
10.0000 mL | INTRAVENOUS | Status: DC | PRN
  Administered 2022-11-08: 10 mL

## 2022-11-08 NOTE — Patient Instructions (Signed)

## 2022-11-08 NOTE — Telephone Encounter (Signed)
Per Dory Peru, case of ensure given to patient during appointment today.

## 2022-11-16 ENCOUNTER — Telehealth: Payer: Self-pay | Admitting: Genetic Counselor

## 2022-11-16 ENCOUNTER — Ambulatory Visit: Payer: Self-pay | Admitting: Genetic Counselor

## 2022-11-16 ENCOUNTER — Encounter: Payer: Self-pay | Admitting: Genetic Counselor

## 2022-11-16 DIAGNOSIS — Z1379 Encounter for other screening for genetic and chromosomal anomalies: Secondary | ICD-10-CM

## 2022-11-16 DIAGNOSIS — C189 Malignant neoplasm of colon, unspecified: Secondary | ICD-10-CM

## 2022-11-16 DIAGNOSIS — Z1509 Genetic susceptibility to other malignant neoplasm: Secondary | ICD-10-CM | POA: Insufficient documentation

## 2022-11-16 DIAGNOSIS — Z800 Family history of malignant neoplasm of digestive organs: Secondary | ICD-10-CM

## 2022-11-16 HISTORY — DX: Genetic susceptibility to other malignant neoplasm: Z15.09

## 2022-11-16 NOTE — Progress Notes (Signed)
GENETIC TEST RESULTS   Patient Name: Anthony Calhoun Patient Age: 57 y.o. Encounter Date: 11/16/2022  Referring Provider: Betsy Coder, MD   Anthony Calhoun was seen virtually in the [facility] clinic on October 24, 2022 due to a personal history of colon cancer and a potential germline finding in the PMS2 gene on his [facility] somatic testing.   Please refer to the prior Genetics clinic note for more information regarding Anthony Calhoun's medical and family histories and our assessment at the time.   FAMILY HISTORY:  We obtained a detailed, 4-generation family history.  Significant diagnoses are listed below:      Family History  Problem Relation Age of Onset   Leukemia Mother          dx 36s   Lung cancer Father          dx and d. 67s; smoking hx   Ovarian cancer Maternal Aunt          d. after 46   Colon cancer Maternal Uncle          mother's pat half brother; dx unknown age   Lung cancer Half-Brother          mat half brother       Anthony Calhoun is unaware of previous family history of genetic testing for hereditary cancer risks. There is no reported Ashkenazi Jewish ancestry. There is no known consanguinity.  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 Specifically, this variant is  [id number] ([id number].[id number]).  No other deleterious variants were detected in the [facility] CustomNext-Cancer +RNAinsight Panel.  The CustomNext-Cancer+RNAinsight panel offered by [facility] includes sequencing, rearrangement, and RNA analysis for the following 48 genes:   APC, ATM, BAP1, BARD1, BMPR1A, BRCA1, BRCA2, BRIP1, CDH1, CDK4, CDKN2A, CHEK2, DICER1, FH, MEN1, MLH1, MSH2, MSH6, MUTYH, NF1, NTHL1, PALB2, PMS2, PTEN, RAD51C, RAD51D, SDHA, SDHB, SDHC, SDHD, SMAD4, SMARCA4, STK11, TP53, TSC1, TSC2 and VHL (sequencing and deletion/duplication); AXIN2, CTNNA1, EGFR, HOXB13, KIT, MSH3, PDGFRA, POLD1 and  POLE (sequencing only); EPCAM and GREM1 (deletion/duplication only). RNA data is routinely analyzed for use in variant interpretation for all genes.    The test report has been scanned into EPIC and is located under the Molecular Pathology section of the Results Review tab.  A portion of the result report is included below for reference. Genetic testing reported out on November 15, 2022.      Clinical Information Pathogenic variants in the PMS2 gene are associated with Lynch syndrome.  The cancers associated with PMS2 are: Colorectal cancer, 8.7-20% risk (average age of diagnosis is 30-66) Endometrial cancer, 13-26% risk (average age of diagnosis is 24-50) Ovarian cancer, up to a 3% risk (average age of diagnosis is 25-59) Renal pelvis and/or ureter, up to a 3.7% risk (average age of diagnosis is unknown) Prostate, pancreatic, gastric, small bowel, bladder, biliary tract, and brain cancer risk may be elevated---current evidence is not consistent with a significantly increased risk due to PMS2 mutation   Management Recommendations:  Colorectal Cancer Screening: High quality colonoscopy at age 54-35 or 2-5 years prior to the earliest colon cancer if it is diagnosed before age 58 and repeat every 1-3 years Studies have demonstrated that the use of daily aspirin decreases the colorectal cancer risk in patients with Lynch Syndrome. Ongoing studies are investigating the optimal dose and duration of use of aspirin for colon cancer prevention in Lynch Syndrome. The decision to use aspirin should be made  on an individualized basis including a discussion of dose, benefits, and adverse effects.    Endometrial Cancer Screening/Risk Reduction: Women should report any abnormal uterine bleeding or postmenopausal bleeding. The evaluation of these symptoms should include an endometrial biopsy. A hysterectomy may be considered. The timing should be individualized based on whether childbearing is complete,  comorbidities, and family history. Endometrial cancer screening does not have a proven benefit in women with Lynch Syndrome. However, endometrial biopsy is highly sensitive and specific as a diagnostic procedure. Screening via endometrial biopsy every 1-2 years starting at age 11-35 can be considered. Transvaginal ultrasounds may be considered in postmenopausal women at their clinician's discretion.  Ovarian Cancer Screening/Risk Reduction: There is currently insufficient evidence to make a specific recommendation for a prophylactic bilateral salpingo-oophorectomy (BSO), or having the ovaries and fallopian tubes removed, for individuals who carry a PMS2 pathogenic variant.  PMS2 pathogenic variant carriers appear to be at no greater than average risk for ovarian cancer, and may consider deferring surveillance and may reasonably elect not to have an oophorectomy.  Current data does not support routine ovarian screening for Lynch syndrome.  If there is a family history of ovarian cancer, have a discussion with your physician about the benefits and limitations of screening and risk reduction strategies. Transvaginal ultrasound for ovarian cancer screening has not been shown to be sufficiently sensitive or specific as to support a routine recommendation, but may be considered at the clinician's discretion. Serum CA-125 is an additional ovarian screening test with caveats similar to transvaginal ultrasound Women should be aware of symptoms that might be associated with the development of ovarian cancer including pelvic or abdominal pain, bloating, increased abdominal girth, difficulty eating, early satiety, or urinary frequency or urgency. Symptoms that persist for several weeks and are a change from a woman's baseline should prompt evaluation by her physician.  Urothelial Cancer Screening: There is no clear evidence to support surveillance for urothelial cancer in Lynch syndrome.  Annual urinalysis starting  at age 62-35 may be considered in selected individuals such as those with a family history of urothelial cancer (renal pelvis, ureter, and/or bladder).  Gastric and Small Bowel Cancer Screening: Consider upper GI surveillance with EGD starting at age 43-40 and repeat every 2-4 years, preferrably in conjuction with colonoscopy.  Individuals not undergoing upper endoscopic surveillance should have one-time non-invasive testing for H. pylori at the time of Lynch syndrome diagnosis, with treatment indicated if H. pylori is detected.    Pancreatic Cancer Screening: PMS2 carriers have not been shown to be at increased risk for pancreatic cancer.  Patients with a family history of pancreaitc cancer should receive care based on careful assessment and clinical judgement.   Prostate Cancer Screening: Consider beginning annual PSA blood test and digital rectal exams at age 68.  Brain Cancer Screening: Patients should be educated regarding signs and symptoms of neurologic cancer and the importance of prompt reporting of abnormal symptoms to their physicians.  Additional Considerations: Patients of reproductive age should be made aware of options for prenatal diagnosis and assisted reproduction including pre-implantation genetic diagnosis.  Individuals with a single pathogenic PMS2 variant are also carriers of constitutional MMR deficiency (CMMRD) syndrome. CMMRD is a childhood-onset cancer predisposition syndrome that can present with hematological malignancies, cancers of the brain and central nervous system, Lynch syndrome-associated cancers (colon, uterine, small bowel, urinary tract), embryonic tumors, and sarcomas. Some affected individuals may also display cafe-au-lait macules. For there to be a risk of CMMRD in offspring, an individual  and their partner would each have to have a single pathogenic variant in the same MMR gene; in such a case, the risk of having an affected child is 25%.   This  information is based on current understanding of the gene and may change in the future.  Implications for Family Members: Hereditary predisposition to cancer due to pathogenic variants in the PMS2 gene has autosomal dominant inheritance. This means that an individual with a pathogenic variant has a 50% chance of passing the condition on to his/her offspring. Identification of a pathogenic variant allows for the recognition of at-risk relatives who can pursue testing for the familial variant.  Family members are encouraged to consider genetic testing for this familial pathogenic variant. As there are generally no childhood cancer risks associated with single pathogenic variants in the PSM2 gene, individuals in the family are not recommended to have testing until they reach at least 58 years of age. They may contact our office at [phone] for more information or to schedule an appointment.  Complimentary testing for the familial variant is available for 90 days from the report date. Family members who live outside of the area are encouraged to find a genetic counselor in their area by visiting: [email or web address].  Resources: FORCE (Facing Our Risk of Cancer Empowered) is a resource for those with a hereditary predisposition to develop cancer.  FORCE provides information about risk reduction, advocacy, legislation, and clinical trials.  Additionally, FORCE provides a platform for collaboration and support which includes: peer navigation, message boards, local support groups, a toll-free helpline, research registry and recruitment, advocate training, published medical research, webinars, brochures, mastectomy photos, and more.  For more information, visit www.facingourrisk.[place] for Research and Education for Lynch syndrome: www.fightlynch.org  Plan:  Family sharing letter sent to Anthony Calhoun to encourage family testing.  He plans to share information  with his children.  He stated that his more distant relatives are deceased or not in contact with him.    Brooks Kinnan M. Joette Catching, [place], [facility] Genetic Counselor [email or web address] (P) [phone]

## 2022-11-16 NOTE — Telephone Encounter (Signed)
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  Discussed cancer risks and management strategies for those with PMS2  mutations. Encouraged family testing.

## 2022-11-19 ENCOUNTER — Other Ambulatory Visit: Payer: Self-pay | Admitting: Oncology

## 2022-11-20 ENCOUNTER — Inpatient Hospital Stay: Payer: Medicaid Other

## 2022-11-20 ENCOUNTER — Encounter: Payer: Self-pay | Admitting: Oncology

## 2022-11-20 ENCOUNTER — Encounter: Payer: Self-pay | Admitting: *Deleted

## 2022-11-20 ENCOUNTER — Inpatient Hospital Stay: Payer: Medicaid Other | Admitting: Oncology

## 2022-11-20 VITALS — BP 133/82 | HR 87 | Temp 98.2°F | Resp 18 | Ht 66.0 in | Wt 244.6 lb

## 2022-11-20 VITALS — BP 112/70 | HR 77 | Resp 18

## 2022-11-20 DIAGNOSIS — C189 Malignant neoplasm of colon, unspecified: Secondary | ICD-10-CM | POA: Diagnosis not present

## 2022-11-20 DIAGNOSIS — C787 Secondary malignant neoplasm of liver and intrahepatic bile duct: Secondary | ICD-10-CM | POA: Diagnosis not present

## 2022-11-20 DIAGNOSIS — Z5111 Encounter for antineoplastic chemotherapy: Secondary | ICD-10-CM | POA: Diagnosis not present

## 2022-11-20 LAB — CBC WITH DIFFERENTIAL (CANCER CENTER ONLY)
Abs Immature Granulocytes: 0.01 10*3/uL (ref 0.00–0.07)
Basophils Absolute: 0 10*3/uL (ref 0.0–0.1)
Basophils Relative: 1 %
Eosinophils Absolute: 0.1 10*3/uL (ref 0.0–0.5)
Eosinophils Relative: 3 %
HCT: 38.6 % — ABNORMAL LOW (ref 39.0–52.0)
Hemoglobin: 12.5 g/dL — ABNORMAL LOW (ref 13.0–17.0)
Immature Granulocytes: 0 %
Lymphocytes Relative: 21 %
Lymphs Abs: 0.8 10*3/uL (ref 0.7–4.0)
MCH: 25.8 pg — ABNORMAL LOW (ref 26.0–34.0)
MCHC: 32.4 g/dL (ref 30.0–36.0)
MCV: 79.8 fL — ABNORMAL LOW (ref 80.0–100.0)
Monocytes Absolute: 0.6 10*3/uL (ref 0.1–1.0)
Monocytes Relative: 16 %
Neutro Abs: 2.2 10*3/uL (ref 1.7–7.7)
Neutrophils Relative %: 59 %
Platelet Count: 123 10*3/uL — ABNORMAL LOW (ref 150–400)
RBC: 4.84 MIL/uL (ref 4.22–5.81)
RDW: 18.6 % — ABNORMAL HIGH (ref 11.5–15.5)
WBC Count: 3.8 10*3/uL — ABNORMAL LOW (ref 4.0–10.5)
nRBC: 0 % (ref 0.0–0.2)

## 2022-11-20 LAB — CMP (CANCER CENTER ONLY)
ALT: 42 U/L (ref 0–44)
AST: 44 U/L — ABNORMAL HIGH (ref 15–41)
Albumin: 3.9 g/dL (ref 3.5–5.0)
Alkaline Phosphatase: 179 U/L — ABNORMAL HIGH (ref 38–126)
Anion gap: 7 (ref 5–15)
BUN: 7 mg/dL (ref 6–20)
CO2: 26 mmol/L (ref 22–32)
Calcium: 8.8 mg/dL — ABNORMAL LOW (ref 8.9–10.3)
Chloride: 105 mmol/L (ref 98–111)
Creatinine: 0.62 mg/dL (ref 0.61–1.24)
GFR, Estimated: >60 mL/min (ref >60)
Glucose, Bld: 167 mg/dL — ABNORMAL HIGH (ref 70–99)
Potassium: 3.6 mmol/L (ref 3.5–5.1)
Sodium: 138 mmol/L (ref 135–145)
Total Bilirubin: 0.9 mg/dL (ref 0.3–1.2)
Total Protein: 7 g/dL (ref 6.5–8.1)

## 2022-11-20 LAB — CEA (ACCESS): CEA (CHCC): 37.78 ng/mL — ABNORMAL HIGH (ref 0.00–5.00)

## 2022-11-20 MED ORDER — FLUOROURACIL CHEMO INJECTION 500 MG/10ML
200.0000 mg/m2 | Freq: Once | INTRAVENOUS | Status: AC
  Administered 2022-11-20: 450 mg via INTRAVENOUS
  Filled 2022-11-20: qty 9

## 2022-11-20 MED ORDER — SODIUM CHLORIDE 0.9 % IV SOLN
5.0000 mg/kg | Freq: Once | INTRAVENOUS | Status: AC
  Administered 2022-11-20: 500 mg via INTRAVENOUS
  Filled 2022-11-20: qty 16

## 2022-11-20 MED ORDER — OXALIPLATIN CHEMO INJECTION 100 MG/20ML
85.0000 mg/m2 | Freq: Once | INTRAVENOUS | Status: AC
  Administered 2022-11-20: 200 mg via INTRAVENOUS
  Filled 2022-11-20: qty 40

## 2022-11-20 MED ORDER — SODIUM CHLORIDE 0.9 % IV SOLN
10.0000 mg | Freq: Once | INTRAVENOUS | Status: AC
  Administered 2022-11-20: 10 mg via INTRAVENOUS
  Filled 2022-11-20: qty 10

## 2022-11-20 MED ORDER — LEUCOVORIN CALCIUM INJECTION 350 MG
200.0000 mg/m2 | Freq: Once | INTRAVENOUS | Status: AC
  Administered 2022-11-20: 448 mg via INTRAVENOUS
  Filled 2022-11-20: qty 22.4

## 2022-11-20 MED ORDER — SODIUM CHLORIDE 0.9 % IV SOLN: Freq: Once | INTRAVENOUS | Status: AC

## 2022-11-20 MED ORDER — PALONOSETRON HCL INJECTION 0.25 MG/5ML
0.2500 mg | Freq: Once | INTRAVENOUS | Status: AC
  Administered 2022-11-20: 0.25 mg via INTRAVENOUS
  Filled 2022-11-20: qty 5

## 2022-11-20 MED ORDER — DEXTROSE 5 % IV SOLN: Freq: Once | INTRAVENOUS | Status: AC

## 2022-11-20 MED ORDER — SODIUM CHLORIDE 0.9 % IV SOLN
1600.0000 mg/m2 | INTRAVENOUS | Status: DC
  Administered 2022-11-20: 3500 mg via INTRAVENOUS
  Filled 2022-11-20: qty 70

## 2022-11-20 NOTE — Patient Instructions (Signed)
Anthony Calhoun   Discharge Instructions: Thank you for choosing [facility] to provide your oncology and hematology care.   If you have a lab appointment with the [facility], please go directly to the [facility] and check in at the registration area.   Wear comfortable clothing and clothing appropriate for easy access to any Portacath or PICC line.   We strive to give you quality time with your provider. You may need to reschedule your appointment if you arrive late (15 or more minutes).  Arriving late affects you and other patients whose appointments are after yours.  Also, if you miss three or more appointments without notifying the office, you may be dismissed from the clinic at the provider's discretion.      For prescription refill requests, have your pharmacy contact our office and allow 72 hours for refills to be completed.    Today you received the following chemotherapy and/or immunotherapy agents Bevacizumab-adcd (VEGZELMA), Oxaliplatin (ELOXATIN), Leucovorin & Flourouracil (ADRUCIL).      To help prevent nausea and vomiting after your treatment, we encourage you to take your nausea medication as directed.  BELOW ARE SYMPTOMS THAT SHOULD BE REPORTED IMMEDIATELY: *FEVER GREATER THAN 100.4 F (38 C) OR HIGHER *CHILLS OR SWEATING *NAUSEA AND VOMITING THAT IS NOT CONTROLLED WITH YOUR NAUSEA MEDICATION *UNUSUAL SHORTNESS OF BREATH *UNUSUAL BRUISING OR BLEEDING *URINARY PROBLEMS (pain or burning when urinating, or frequent urination) *BOWEL PROBLEMS (unusual diarrhea, constipation, pain near the anus) TENDERNESS IN MOUTH AND THROAT WITH OR WITHOUT PRESENCE OF ULCERS (sore throat, sores in mouth, or a toothache) UNUSUAL RASH, SWELLING OR PAIN  UNUSUAL VAGINAL DISCHARGE OR ITCHING   Items with * indicate a potential emergency and should be followed up as soon as possible or go to the Emergency Department if any problems should  occur.  Please show the CHEMOTHERAPY ALERT CARD or IMMUNOTHERAPY ALERT CARD at check-in to the Emergency Department and triage nurse.  Should you have questions after your visit or need to cancel or reschedule your appointment, please contact [facility]  Dept: [phone]  and follow the prompts.  Office hours are 8:00 a.m. to 4:30 p.m. Monday - Friday. Please note that voicemails left after 4:00 p.m. may not be returned until the following business day.  We are closed weekends and major holidays. You have access to a nurse at all times for urgent questions. Please call the main number to the clinic Dept: [phone] and follow the prompts.   For any non-urgent questions, you may also contact your provider using MyChart. We now offer e-Visits for anyone 19 and older to request care online for non-urgent symptoms. For details visit [email or web address].   Also download the MyChart app! Go to the app store, search "MyChart", open the app, select [facility], and log in with your MyChart username and password.  Bevacizumab Injection What is this medication? BEVACIZUMAB (be va SIZ yoo mab) treats some types of cancer. It works by blocking a protein that causes cancer cells to grow and multiply. This helps to slow or stop the spread of cancer cells. It is a monoclonal antibody. This medicine may be used for other purposes; ask your health care provider or pharmacist if you have questions. COMMON BRAND NAME(S): Alymsys, Avastin, MVASI, Noah Charon What should I tell my care team before I take this medication? They need to know if you have any of these conditions: Blood clots Coughing up  blood Having or recent surgery Heart failure High blood pressure History of a connection between 2 or more body parts that do not usually connect (fistula) History of a tear in your stomach or intestines Protein in your urine An unusual or allergic reaction to bevacizumab,  other medications, foods, dyes, or preservatives Pregnant or trying to get pregnant Breast-feeding How should I use this medication? This medication is injected into a vein. It is given by your care team in a hospital or clinic setting. Talk to your care team the use of this medication in children. Special care may be needed. Overdosage: If you think you have taken too much of this medicine contact a poison control center or emergency room at once. NOTE: This medicine is only for you. Do not share this medicine with others. What if I miss a dose? Keep appointments for follow-up doses. It is important not to miss your dose. Call your care team if you are unable to keep an appointment. What may interact with this medication? Interactions are not expected. This list may not describe all possible interactions. Give your health care provider a list of all the medicines, herbs, non-prescription drugs, or dietary supplements you use. Also tell them if you smoke, drink alcohol, or use illegal drugs. Some items may interact with your medicine. What should I watch for while using this medication? Your condition will be monitored carefully while you are receiving this medication. You may need blood work while taking this medication. This medication may make you feel generally unwell. This is not uncommon as chemotherapy can affect healthy cells as well as cancer cells. Report any side effects. Continue your course of treatment even though you feel ill unless your care team tells you to stop. This medication may increase your risk to bruise or bleed. Call your care team if you notice any unusual bleeding. Before having surgery, talk to your care team to make sure it is ok. This medication can increase the risk of poor healing of your surgical site or wound. You will need to stop this medication for 28 days before surgery. After surgery, wait at least 28 days before restarting this medication. Make sure the  surgical site or wound is healed enough before restarting this medication. Talk to your care team if questions. Talk to your care team if you may be pregnant. Serious birth defects can occur if you take this medication during pregnancy and for 6 months after the last dose. Contraception is recommended while taking this medication and for 6 months after the last dose. Your care team can help you find the option that works for you. Do not breastfeed while taking this medication and for 6 months after the last dose. This medication can cause infertility. Talk to your care team if you are concerned about your fertility. What side effects may I notice from receiving this medication? Side effects that you should report to your care team as soon as possible: Allergic reactions--skin rash, itching, hives, swelling of the face, lips, tongue, or throat Bleeding--bloody or black, tar-like stools, vomiting blood or brown material that looks like coffee grounds, red or dark brown urine, small red or purple spots on skin, unusual bruising or bleeding Blood clot--pain, swelling, or warmth in the leg, shortness of breath, chest pain Heart attack--pain or tightness in the chest, shoulders, arms, or jaw, nausea, shortness of breath, cold or clammy skin, feeling faint or lightheaded Heart failure--shortness of breath, swelling of the ankles, feet, or hands,  sudden weight gain, unusual weakness or fatigue Increase in blood pressure Infection--fever, chills, cough, sore throat, wounds that don't heal, pain or trouble when passing urine, general feeling of discomfort or being unwell Infusion reactions--chest pain, shortness of breath or trouble breathing, feeling faint or lightheaded Kidney injury--decrease in the amount of urine, swelling of the ankles, hands, or feet Stomach pain that is severe, does not go away, or gets worse Stroke--sudden numbness or weakness of the face, arm, or leg, trouble speaking, confusion,  trouble walking, loss of balance or coordination, dizziness, severe headache, change in vision Sudden and severe headache, confusion, change in vision, seizures, which may be signs of posterior reversible encephalopathy syndrome (PRES) Side effects that usually do not require medical attention (report to your care team if they continue or are bothersome): Back pain Change in taste Diarrhea Dry skin Increased tears Nosebleed This list may not describe all possible side effects. Call your doctor for medical advice about side effects. You may report side effects to FDA at 1-800-FDA-1088. Where should I keep my medication? This medication is given in a hospital or clinic. It will not be stored at home. NOTE: This sheet is a summary. It may not cover all possible information. If you have questions about this medicine, talk to your doctor, pharmacist, or health care provider.  2023 Elsevier/Gold Standard (2021-12-16 00:00:00)  Oxaliplatin Injection What is this medication? OXALIPLATIN (ox AL i PLA tin) treats colorectal cancer. It works by slowing down the growth of cancer cells. This medicine may be used for other purposes; ask your health care provider or pharmacist if you have questions. COMMON BRAND NAME(S): Eloxatin What should I tell my care team before I take this medication? They need to know if you have any of these conditions: Heart disease History of irregular heartbeat or rhythm Liver disease Low blood cell levels (white cells, red cells, and platelets) Lung or breathing disease, such as asthma Take medications that treat or prevent blood clots Tingling of the fingers, toes, or other nerve disorder An unusual or allergic reaction to oxaliplatin, other medications, foods, dyes, or preservatives If you or your partner are pregnant or trying to get pregnant Breast-feeding How should I use this medication? This medication is injected into a vein. It is given by your care team in a  hospital or clinic setting. Talk to your care team about the use of this medication in children. Special care may be needed. Overdosage: If you think you have taken too much of this medicine contact a poison control center or emergency room at once. NOTE: This medicine is only for you. Do not share this medicine with others. What if I miss a dose? Keep appointments for follow-up doses. It is important not to miss a dose. Call your care team if you are unable to keep an appointment. What may interact with this medication? Do not take this medication with any of the following: Cisapride Dronedarone Pimozide Thioridazine This medication may also interact with the following: Aspirin and aspirin-like medications Certain medications that treat or prevent blood clots, such as warfarin, apixaban, dabigatran, and rivaroxaban Cisplatin Cyclosporine Diuretics Medications for infection, such as acyclovir, adefovir, amphotericin B, bacitracin, cidofovir, foscarnet, ganciclovir, gentamicin, pentamidine, vancomycin NSAIDs, medications for pain and inflammation, such as ibuprofen or naproxen Other medications that cause heart rhythm changes Pamidronate Zoledronic acid This list may not describe all possible interactions. Give your health care provider a list of all the medicines, herbs, non-prescription drugs, or dietary supplements you  use. Also tell them if you smoke, drink alcohol, or use illegal drugs. Some items may interact with your medicine. What should I watch for while using this medication? Your condition will be monitored carefully while you are receiving this medication. You may need blood work while taking this medication. This medication may make you feel generally unwell. This is not uncommon as chemotherapy can affect healthy cells as well as cancer cells. Report any side effects. Continue your course of treatment even though you feel ill unless your care team tells you to stop. This  medication may increase your risk of getting an infection. Call your care team for advice if you get a fever, chills, sore throat, or other symptoms of a cold or flu. Do not treat yourself. Try to avoid being around people who are sick. Avoid taking medications that contain aspirin, acetaminophen, ibuprofen, naproxen, or ketoprofen unless instructed by your care team. These medications may hide a fever. Be careful brushing or flossing your teeth or using a toothpick because you may get an infection or bleed more easily. If you have any dental work done, tell your dentist you are receiving this medication. This medication can make you more sensitive to cold. Do not drink cold drinks or use ice. Cover exposed skin before coming in contact with cold temperatures or cold objects. When out in cold weather wear warm clothing and cover your mouth and nose to warm the air that goes into your lungs. Tell your care team if you get sensitive to the cold. Talk to your care team if you or your partner are pregnant or think either of you might be pregnant. This medication can cause serious birth defects if taken during pregnancy and for 9 months after the last dose. A negative pregnancy test is required before starting this medication. A reliable form of contraception is recommended while taking this medication and for 9 months after the last dose. Talk to your care team about effective forms of contraception. Do not father a child while taking this medication and for 6 months after the last dose. Use a condom while having sex during this time period. Do not breastfeed while taking this medication and for 3 months after the last dose. This medication may cause infertility. Talk to your care team if you are concerned about your fertility. What side effects may I notice from receiving this medication? Side effects that you should report to your care team as soon as possible: Allergic reactions--skin rash, itching, hives,  swelling of the face, lips, tongue, or throat Bleeding--bloody or black, tar-like stools, vomiting blood or brown material that looks like coffee grounds, red or dark brown urine, small red or purple spots on skin, unusual bruising or bleeding Dry cough, shortness of breath or trouble breathing Heart rhythm changes--fast or irregular heartbeat, dizziness, feeling faint or lightheaded, chest pain, trouble breathing Infection--fever, chills, cough, sore throat, wounds that don't heal, pain or trouble when passing urine, general feeling of discomfort or being unwell Liver injury--right upper belly pain, loss of appetite, nausea, light-colored stool, dark yellow or brown urine, yellowing skin or eyes, unusual weakness or fatigue Low red blood cell level--unusual weakness or fatigue, dizziness, headache, trouble breathing Muscle injury--unusual weakness or fatigue, muscle pain, dark yellow or brown urine, decrease in amount of urine Pain, tingling, or numbness in the hands or feet Sudden and severe headache, confusion, change in vision, seizures, which may be signs of posterior reversible encephalopathy syndrome (PRES) Unusual bruising or bleeding  Side effects that usually do not require medical attention (report to your care team if they continue or are bothersome): Diarrhea Nausea Pain, redness, or swelling with sores inside the mouth or throat Unusual weakness or fatigue Vomiting This list may not describe all possible side effects. Call your doctor for medical advice about side effects. You may report side effects to FDA at 1-800-FDA-1088. Where should I keep my medication? This medication is given in a hospital or clinic. It will not be stored at home. NOTE: This sheet is a summary. It may not cover all possible information. If you have questions about this medicine, talk to your doctor, pharmacist, or health care provider.  2023 Elsevier/Gold Standard (2007-10-05 00:00:00)  Leucovorin  Injection What is this medication? LEUCOVORIN (loo koe VOR in) prevents side effects from certain medications, such as methotrexate. It works by increasing folate levels. This helps protect healthy cells in your body. It may also be used to treat anemia caused by low levels of folate. It can also be used with fluorouracil, a type of chemotherapy, to treat colorectal cancer. It works by increasing the effects of fluorouracil in the body. This medicine may be used for other purposes; ask your health care provider or pharmacist if you have questions. What should I tell my care team before I take this medication? They need to know if you have any of these conditions: Anemia from low levels of vitamin B12 in the blood An unusual or allergic reaction to leucovorin, folic acid, other medications, foods, dyes, or preservatives Pregnant or trying to get pregnant Breastfeeding How should I use this medication? This medication is injected into a vein or a muscle. It is given by your care team in a hospital or clinic setting. Talk to your care team about the use of this medication in children. Special care may be needed. Overdosage: If you think you have taken too much of this medicine contact a poison control center or emergency room at once. NOTE: This medicine is only for you. Do not share this medicine with others. What if I miss a dose? Keep appointments for follow-up doses. It is important not to miss your dose. Call your care team if you are unable to keep an appointment. What may interact with this medication? Capecitabine Fluorouracil Phenobarbital Phenytoin Primidone Trimethoprim;sulfamethoxazole This list may not describe all possible interactions. Give your health care provider a list of all the medicines, herbs, non-prescription drugs, or dietary supplements you use. Also tell them if you smoke, drink alcohol, or use illegal drugs. Some items may interact with your medicine. What should I  watch for while using this medication? Your condition will be monitored carefully while you are receiving this medication. This medication may increase the side effects of 5-fluorouracil. Tell your care team if you have diarrhea or mouth sores that do not get better or that get worse. What side effects may I notice from receiving this medication? Side effects that you should report to your care team as soon as possible: Allergic reactions--skin rash, itching, hives, swelling of the face, lips, tongue, or throat This list may not describe all possible side effects. Call your doctor for medical advice about side effects. You may report side effects to FDA at 1-800-FDA-1088. Where should I keep my medication? This medication is given in a hospital or clinic. It will not be stored at home. NOTE: This sheet is a summary. It may not cover all possible information. If you have questions about this  medicine, talk to your doctor, pharmacist, or health care provider.  2023 Elsevier/Gold Standard (2021-12-23 00:00:00)  Fluorouracil Injection What is this medication? FLUOROURACIL (flure oh YOOR a sil) treats some types of cancer. It works by slowing down the growth of cancer cells. This medicine may be used for other purposes; ask your health care provider or pharmacist if you have questions. COMMON BRAND NAME(S): Adrucil What should I tell my care team before I take this medication? They need to know if you have any of these conditions: Blood disorders Dihydropyrimidine dehydrogenase (DPD) deficiency Infection, such as chickenpox, cold sores, herpes Kidney disease Liver disease Poor nutrition Recent or ongoing radiation therapy An unusual or allergic reaction to fluorouracil, other medications, foods, dyes, or preservatives If you or your partner are pregnant or trying to get pregnant Breast-feeding How should I use this medication? This medication is injected into a vein. It is administered by  your care team in a hospital or clinic setting. Talk to your care team about the use of this medication in children. Special care may be needed. Overdosage: If you think you have taken too much of this medicine contact a poison control center or emergency room at once. NOTE: This medicine is only for you. Do not share this medicine with others. What if I miss a dose? Keep appointments for follow-up doses. It is important not to miss your dose. Call your care team if you are unable to keep an appointment. What may interact with this medication? Do not take this medication with any of the following: Live virus vaccines This medication may also interact with the following: Medications that treat or prevent blood clots, such as warfarin, enoxaparin, dalteparin This list may not describe all possible interactions. Give your health care provider a list of all the medicines, herbs, non-prescription drugs, or dietary supplements you use. Also tell them if you smoke, drink alcohol, or use illegal drugs. Some items may interact with your medicine. What should I watch for while using this medication? Your condition will be monitored carefully while you are receiving this medication. This medication may make you feel generally unwell. This is not uncommon as chemotherapy can affect healthy cells as well as cancer cells. Report any side effects. Continue your course of treatment even though you feel ill unless your care team tells you to stop. In some cases, you may be given additional medications to help with side effects. Follow all directions for their use. This medication may increase your risk of getting an infection. Call your care team for advice if you get a fever, chills, sore throat, or other symptoms of a cold or flu. Do not treat yourself. Try to avoid being around people who are sick. This medication may increase your risk to bruise or bleed. Call your care team if you notice any unusual  bleeding. Be careful brushing or flossing your teeth or using a toothpick because you may get an infection or bleed more easily. If you have any dental work done, tell your dentist you are receiving this medication. Avoid taking medications that contain aspirin, acetaminophen, ibuprofen, naproxen, or ketoprofen unless instructed by your care team. These medications may hide a fever. Do not treat diarrhea with over the counter products. Contact your care team if you have diarrhea that lasts more than 2 days or if it is severe and watery. This medication can make you more sensitive to the sun. Keep out of the sun. If you cannot avoid being in the  sun, wear protective clothing and sunscreen. Do not use sun lamps, tanning beds, or tanning booths. Talk to your care team if you or your partner wish to become pregnant or think you might be pregnant. This medication can cause serious birth defects if taken during pregnancy and for 3 months after the last dose. A reliable form of contraception is recommended while taking this medication and for 3 months after the last dose. Talk to your care team about effective forms of contraception. Do not father a child while taking this medication and for 3 months after the last dose. Use a condom while having sex during this time period. Do not breastfeed while taking this medication. This medication may cause infertility. Talk to your care team if you are concerned about your fertility. What side effects may I notice from receiving this medication? Side effects that you should report to your care team as soon as possible: Allergic reactions--skin rash, itching, hives, swelling of the face, lips, tongue, or throat Heart attack--pain or tightness in the chest, shoulders, arms, or jaw, nausea, shortness of breath, cold or clammy skin, feeling faint or lightheaded Heart failure--shortness of breath, swelling of the ankles, feet, or hands, sudden weight gain, unusual weakness  or fatigue Heart rhythm changes--fast or irregular heartbeat, dizziness, feeling faint or lightheaded, chest pain, trouble breathing High ammonia level--unusual weakness or fatigue, confusion, loss of appetite, nausea, vomiting, seizures Infection--fever, chills, cough, sore throat, wounds that don't heal, pain or trouble when passing urine, general feeling of discomfort or being unwell Low red blood cell level--unusual weakness or fatigue, dizziness, headache, trouble breathing Pain, tingling, or numbness in the hands or feet, muscle weakness, change in vision, confusion or trouble speaking, loss of balance or coordination, trouble walking, seizures Redness, swelling, and blistering of the skin over hands and feet Severe or prolonged diarrhea Unusual bruising or bleeding Side effects that usually do not require medical attention (report to your care team if they continue or are bothersome): Dry skin Headache Increased tears Nausea Pain, redness, or swelling with sores inside the mouth or throat Sensitivity to light Vomiting This list may not describe all possible side effects. Call your doctor for medical advice about side effects. You may report side effects to FDA at 1-800-FDA-1088. Where should I keep my medication? This medication is given in a hospital or clinic. It will not be stored at home. NOTE: This sheet is a summary. It may not cover all possible information. If you have questions about this medicine, talk to your doctor, pharmacist, or health care provider.  2023 Elsevier/Gold Standard (2021-12-13 00:00:00)  The chemotherapy medication bag should finish at 46 hours, 96 hours, or 7 days. For example, if your pump is scheduled for 46 hours and it was put on at 4:00 p.m., it should finish at 2:00 p.m. the day it is scheduled to come off regardless of your appointment time.     Estimated time to finish at 1:00 p.m. on Wednesday 11/22/2022.   If the display on your pump reads  "Low Volume" and it is beeping, take the batteries out of the pump and come to the cancer center for it to be taken off.   If the pump alarms go off prior to the pump reading "Low Volume" then call [phone] and someone can assist you.  If the plunger comes out and the chemotherapy medication is leaking out, please use your home chemo spill kit to clean up the spill. Do NOT use paper towels  or other household products.  If you have problems or questions regarding your pump, please call either [phone] (24 hours a day) or the cancer center Monday-Friday 8:00 a.m.- 4:30 p.m. at the clinic number and we will assist you. If you are unable to get assistance, then go to the nearest Emergency Department and ask the staff to contact the IV team for assistance.

## 2022-11-20 NOTE — Progress Notes (Signed)
Patient seen by Dr. Benay Spice today  Vitals are within treatment parameters.No intervention needed for BP 133/83  Labs reviewed by Dr. Benay Spice and are within treatment parameters.  Per physician team, patient is ready for treatment and there are NO modifications to the treatment plan.

## 2022-11-20 NOTE — Progress Notes (Signed)
[facility] OFFICE PROGRESS NOTE   Diagnosis: Colon cancer  INTERVAL HISTORY:   Anthony Calhoun completed another cycle of FOLFOX/Avastin on 11/06/2022.  No bleeding or symptom of thrombosis.  No mouth sores, vomiting, or diarrhea.  Nausea following chemotherapy is relieved with Phenergan.  He reports mild cold sensitivity during the week following chemotherapy.  No neuropathy symptoms at present.  No tongue ornis.  Objective:  Vital signs in last 24 hours:  Blood pressure 133/82, pulse 87, temperature 98.2 F (36.8 C), temperature source Oral, resp. rate 18, height 5\' 6"  (1.676 m), weight 244 lb 9.6 oz (110.9 kg), SpO2 98 %.    HEENT: No thrush or ulcers Resp: Clear bilaterally Cardio: Regular rate and rhythm GI: No hepatosplenomegaly, no mass, mild tenderness in the right subcostal region Vascular: No leg edema  Skin: Without erythema  Portacath/PICC-without erythema  Lab Results:  Lab Results  Component Value Date   WBC 4.4 11/06/2022   HGB 13.6 11/06/2022   HCT 41.7 11/06/2022   MCV 78.8 (L) 11/06/2022   PLT 116 (L) 11/06/2022   NEUTROABS 2.7 11/06/2022    CMP  Lab Results  Component Value Date   NA 138 11/06/2022   K 3.8 11/06/2022   CL 104 11/06/2022   CO2 27 11/06/2022   GLUCOSE 151 (H) 11/06/2022   BUN 7 11/06/2022   CREATININE 0.69 11/06/2022   CALCIUM 9.2 11/06/2022   PROT 7.4 11/06/2022   ALBUMIN 3.9 11/06/2022   AST 48 (H) 11/06/2022   ALT 49 (H) 11/06/2022   ALKPHOS 186 (H) 11/06/2022   BILITOT 1.0 11/06/2022   GFRNONAA >60 11/06/2022   GFRAA >60 05/10/2020    Lab Results  Component Value Date   CEA1 1.08 03/07/2021   CEA 46.89 (H) 11/06/2022      Medications: I have reviewed the patient's current medications.   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 secondary to nausea (patient discontinued Xeloda on day 11 due to colonoscopy) Colonoscopy 07/09/2020-nodular mucosa at the colonic anastomosis, biopsied; patent end-to-side ileocolonic anastomosis characterized by healthy-appearing mucosa and visible sutures; nonbleeding internal hemorrhoids, biopsy with benign ulcerated anastomotic mucosa with granulation tissue Cycle 4 adjuvant Xeloda 07/19/2020, 2000 mg every morning and 1500 mg every afternoon Cycle 5 adjuvant Xeloda 08/09/2020 Cycle 6 adjuvant Xeloda 08/31/2019 Cycle 7 adjuvant Xeloda 09/20/2020 Cycle 8 adjuvant Xeloda 10/11/2020 CTs 03/07/2021-no evidence of recurrent disease, hepatic steatosis, slight wall thickening at the junction of the descending and horizontal duodenum Mild elevation of CEA 2023 12/01/2021-Guardant Reveal-ct DNA detected PET 12/30/2021-hypermetabolic right liver lesion, hypermetabolic area in a loop of mid small bowel with an SUV of 12.5, review of PET images at GI tumor conference 01/11/2022-small bowel uptake felt to be a benign finding MRI liver 01/10/2022-solitary 1.2 cm right liver lesion between segments 7 and 8 compatible with metastasis, subtle changes suggesting cirrhosis, hepatic steatosis CT enteroscopy 02/02/2022-small bowel loop with hypermetabolic activity on PET continues to have a bandlike density along the margin felt to represent a diverticulum or adjacent nodal tissue.  Small bowel tumor not excluded.  3-4 mm left  omental nodule 02/08/2022-biopsy and ablation of solitary liver lesion, adenocarcinoma consistent with metastatic colorectal adenocarcinoma CTs 03/31/2022-unchanged circumstantial  soft tissue thickening surrounding a loop of mid to distal small bowel with an adjacent nodular focus measuring 1.2 cm.,  Ablation cavity in segment 7, no other evidence of metastatic disease Diagnostic laparoscopy 05/26/2022-mid small bowel mass-resected, well to moderately differentiated adenocarcinoma, 0/2 lymph nodes, morphology consistent with a colon primary, tumor involved full-thickness including serosa with perineural and large vessel involvement; foundation 1-microsatellite stable, tumor mutation burden 2, K-ras Q61H CT abdomen/pelvis 07/10/2022-the right liver ablation defect is smaller, new subtle right liver lesion measuring 13 x 9 mm, enlarged lymph node in the right upper quadrant mesentery adjacent to surgical anastomosis PET 07/26/2022-new segment 6 liver lesion, enlarging hypermetabolic left omental lesion, there is another mildly hypermetabolic peritoneal implant, 7 mm omental nodule at the midline without hypermetabolism Cycle 1 FOLFOX 09/25/2022 Cycle 2 FOLFOX/bevacizumab 10/09/2022, 5-FU dose reduced secondary to mucositis Cycle 3 FOLFOX/bevacizumab 10/23/2022 Cycle 4 FOLFOX/bevacizumab 11/06/2022 Cycle 5 FOLFOX/bevacizumab 11/20/2022 Microcytic anemia secondary to #1-improved Colon polyps-sessile serrated adenoma of the descending colon, tubular adenomas with focal high-grade dysplasia of the transverse colon on colonoscopy 02/12/2020, tubular adenoma of the mid ascending colon on the surgical specimen 04/12/2020 Family history of colon cancer Depression Anxiety/panic attacks Hypertension Hyperlipidemia Nausea secondary to Xeloda?-Resolved Gastroesophageal reflux disease-improved following discontinuation of Xeloda Anemia, microcytic; ferritin 6 01/24/2022; stool positive for blood x3 01/25/2022; 02/02/2022 CT abdomen/pelvis enterography-bowel loop demonstrating hypermetabolic activity continues to have a bandlike density along its margin possibly representing a diverticulum or adjacent  nodal tissue, difficult to exclude small bowel tumor, 3 x 4 mm left omental nodule or lymph node, known right hepatic lobe tumor is occult on CT. Venofer weekly x3 beginning 03/03/2022 Negative capsule endoscopy 03/09/2022 Venofer 03/03/2022 for 3 weekly doses Venofer 05/03/2022, 05/19/2022 Venofer 07/28/2022, 08/04/2022 12.  Anterior left neck nodule 03/17/2022-cyst?,  Lymph node? 13.  Mucositis secondary to chemotherapy at office visit 10/03/2022-the 5-FU will be dose reduced with cycle 2 FOLFOX 14.  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  He has tolerated the treatment well.  His clinical status is significantly improved.  The CEA is lower.  He will complete cycle 5 today.  The plan is to complete 6 cycles of FOLFOX/bevacizumab prior to a restaging CT evaluation.  He will return for an office visit and chemotherapy in 2 weeks.  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  However testing on the original resected colon tumor and small bowel mass returned microsatellite stable.  Betsy Coder, MD  11/20/2022  10:16 AM

## 2022-11-22 ENCOUNTER — Inpatient Hospital Stay: Payer: Medicaid Other

## 2022-11-22 VITALS — BP 117/71 | HR 80 | Temp 97.9°F | Resp 18

## 2022-11-22 DIAGNOSIS — C189 Malignant neoplasm of colon, unspecified: Secondary | ICD-10-CM

## 2022-11-22 DIAGNOSIS — Z5111 Encounter for antineoplastic chemotherapy: Secondary | ICD-10-CM | POA: Diagnosis not present

## 2022-11-22 MED ORDER — SODIUM CHLORIDE 0.9% FLUSH
10.0000 mL | INTRAVENOUS | Status: DC | PRN
  Administered 2022-11-22: 10 mL

## 2022-11-22 MED ORDER — HEPARIN SOD (PORK) LOCK FLUSH 100 UNIT/ML IV SOLN
500.0000 [IU] | Freq: Once | INTRAVENOUS | Status: AC | PRN
  Administered 2022-11-22: 500 [IU]

## 2022-11-22 NOTE — Patient Instructions (Signed)

## 2022-11-28 ENCOUNTER — Other Ambulatory Visit: Payer: Self-pay

## 2022-11-28 ENCOUNTER — Telehealth: Payer: Self-pay | Admitting: Genetic Counselor

## 2022-11-28 NOTE — Telephone Encounter (Signed)
Returned call to discuss family variant testing for his son, Rodman Key, and daughter, Marylin Crosby.  Appt scheduled for 4/11.

## 2022-12-03 ENCOUNTER — Other Ambulatory Visit: Payer: Self-pay | Admitting: Oncology

## 2022-12-04 ENCOUNTER — Inpatient Hospital Stay: Payer: Medicaid Other

## 2022-12-04 ENCOUNTER — Inpatient Hospital Stay: Payer: Medicaid Other | Admitting: Oncology

## 2022-12-04 ENCOUNTER — Inpatient Hospital Stay: Payer: Medicaid Other | Attending: Oncology

## 2022-12-04 ENCOUNTER — Encounter: Payer: Self-pay | Admitting: *Deleted

## 2022-12-04 VITALS — BP 126/78 | HR 86 | Resp 18

## 2022-12-04 VITALS — BP 130/88 | HR 84 | Temp 98.2°F | Resp 18 | Ht 66.0 in | Wt 245.0 lb

## 2022-12-04 DIAGNOSIS — D509 Iron deficiency anemia, unspecified: Secondary | ICD-10-CM | POA: Insufficient documentation

## 2022-12-04 DIAGNOSIS — E785 Hyperlipidemia, unspecified: Secondary | ICD-10-CM | POA: Insufficient documentation

## 2022-12-04 DIAGNOSIS — C787 Secondary malignant neoplasm of liver and intrahepatic bile duct: Secondary | ICD-10-CM | POA: Insufficient documentation

## 2022-12-04 DIAGNOSIS — K219 Gastro-esophageal reflux disease without esophagitis: Secondary | ICD-10-CM | POA: Insufficient documentation

## 2022-12-04 DIAGNOSIS — F410 Panic disorder [episodic paroxysmal anxiety] without agoraphobia: Secondary | ICD-10-CM | POA: Diagnosis not present

## 2022-12-04 DIAGNOSIS — K1231 Oral mucositis (ulcerative) due to antineoplastic therapy: Secondary | ICD-10-CM | POA: Diagnosis not present

## 2022-12-04 DIAGNOSIS — I10 Essential (primary) hypertension: Secondary | ICD-10-CM | POA: Insufficient documentation

## 2022-12-04 DIAGNOSIS — C189 Malignant neoplasm of colon, unspecified: Secondary | ICD-10-CM

## 2022-12-04 DIAGNOSIS — C182 Malignant neoplasm of ascending colon: Secondary | ICD-10-CM | POA: Diagnosis present

## 2022-12-04 DIAGNOSIS — Z79899 Other long term (current) drug therapy: Secondary | ICD-10-CM | POA: Insufficient documentation

## 2022-12-04 DIAGNOSIS — F32A Depression, unspecified: Secondary | ICD-10-CM | POA: Diagnosis not present

## 2022-12-04 DIAGNOSIS — Z5111 Encounter for antineoplastic chemotherapy: Secondary | ICD-10-CM | POA: Insufficient documentation

## 2022-12-04 LAB — CMP (CANCER CENTER ONLY)
ALT: 37 U/L (ref 0–44)
AST: 37 U/L (ref 15–41)
Albumin: 3.9 g/dL (ref 3.5–5.0)
Alkaline Phosphatase: 207 U/L — ABNORMAL HIGH (ref 38–126)
Anion gap: 8 (ref 5–15)
BUN: 10 mg/dL (ref 6–20)
CO2: 26 mmol/L (ref 22–32)
Calcium: 9.1 mg/dL (ref 8.9–10.3)
Chloride: 102 mmol/L (ref 98–111)
Creatinine: 0.67 mg/dL (ref 0.61–1.24)
GFR, Estimated: >60 mL/min (ref >60)
Glucose, Bld: 195 mg/dL — ABNORMAL HIGH (ref 70–99)
Potassium: 3.9 mmol/L (ref 3.5–5.1)
Sodium: 136 mmol/L (ref 135–145)
Total Bilirubin: 1.1 mg/dL (ref 0.3–1.2)
Total Protein: 7.1 g/dL (ref 6.5–8.1)

## 2022-12-04 LAB — CBC WITH DIFFERENTIAL (CANCER CENTER ONLY)
Abs Immature Granulocytes: 0.01 10*3/uL (ref 0.00–0.07)
Basophils Absolute: 0 10*3/uL (ref 0.0–0.1)
Basophils Relative: 1 %
Eosinophils Absolute: 0.1 10*3/uL (ref 0.0–0.5)
Eosinophils Relative: 2 %
HCT: 38.4 % — ABNORMAL LOW (ref 39.0–52.0)
Hemoglobin: 12.5 g/dL — ABNORMAL LOW (ref 13.0–17.0)
Immature Granulocytes: 0 %
Lymphocytes Relative: 19 %
Lymphs Abs: 0.8 10*3/uL (ref 0.7–4.0)
MCH: 26.1 pg (ref 26.0–34.0)
MCHC: 32.6 g/dL (ref 30.0–36.0)
MCV: 80.2 fL (ref 80.0–100.0)
Monocytes Absolute: 0.5 10*3/uL (ref 0.1–1.0)
Monocytes Relative: 13 %
Neutro Abs: 2.7 10*3/uL (ref 1.7–7.7)
Neutrophils Relative %: 65 %
Platelet Count: 128 10*3/uL — ABNORMAL LOW (ref 150–400)
RBC: 4.79 MIL/uL (ref 4.22–5.81)
RDW: 18.7 % — ABNORMAL HIGH (ref 11.5–15.5)
WBC Count: 4.1 10*3/uL (ref 4.0–10.5)
nRBC: 0 % (ref 0.0–0.2)

## 2022-12-04 LAB — TOTAL PROTEIN, URINE DIPSTICK: Protein, ur: 30 mg/dL — AB

## 2022-12-04 LAB — CEA (ACCESS): CEA (CHCC): 35.38 ng/mL — ABNORMAL HIGH (ref 0.00–5.00)

## 2022-12-04 MED ORDER — SODIUM CHLORIDE 0.9 % IV SOLN
10.0000 mg | Freq: Once | INTRAVENOUS | Status: AC
  Administered 2022-12-04: 10 mg via INTRAVENOUS
  Filled 2022-12-04: qty 10

## 2022-12-04 MED ORDER — LEUCOVORIN CALCIUM INJECTION 350 MG
200.0000 mg/m2 | Freq: Once | INTRAVENOUS | Status: AC
  Administered 2022-12-04: 448 mg via INTRAVENOUS
  Filled 2022-12-04: qty 22.4

## 2022-12-04 MED ORDER — SODIUM CHLORIDE 0.9 % IV SOLN: Freq: Once | INTRAVENOUS | Status: AC

## 2022-12-04 MED ORDER — PALONOSETRON HCL INJECTION 0.25 MG/5ML
0.2500 mg | Freq: Once | INTRAVENOUS | Status: AC
  Administered 2022-12-04: 0.25 mg via INTRAVENOUS
  Filled 2022-12-04: qty 5

## 2022-12-04 MED ORDER — SODIUM CHLORIDE 0.9 % IV SOLN
1600.0000 mg/m2 | INTRAVENOUS | Status: DC
  Administered 2022-12-04: 3500 mg via INTRAVENOUS
  Filled 2022-12-04: qty 70

## 2022-12-04 MED ORDER — SODIUM CHLORIDE 0.9 % IV SOLN
5.0000 mg/kg | Freq: Once | INTRAVENOUS | Status: AC
  Administered 2022-12-04: 500 mg via INTRAVENOUS
  Filled 2022-12-04: qty 16

## 2022-12-04 MED ORDER — FLUOROURACIL CHEMO INJECTION 500 MG/10ML
200.0000 mg/m2 | Freq: Once | INTRAVENOUS | Status: AC
  Administered 2022-12-04: 450 mg via INTRAVENOUS
  Filled 2022-12-04: qty 9

## 2022-12-04 MED ORDER — OXALIPLATIN CHEMO INJECTION 100 MG/20ML
85.0000 mg/m2 | Freq: Once | INTRAVENOUS | Status: AC
  Administered 2022-12-04: 200 mg via INTRAVENOUS
  Filled 2022-12-04: qty 40

## 2022-12-04 MED ORDER — DEXTROSE 5 % IV SOLN: Freq: Once | INTRAVENOUS | Status: AC

## 2022-12-04 NOTE — Progress Notes (Signed)
[facility] OFFICE PROGRESS NOTE   Diagnosis: Colon cancer  INTERVAL HISTORY:   Mr Schwebke completed another cycle of FOLFOX on 11/20/2022.  He reports developing nausea beginning approximately day 4.  No emesis.  He continues to have nausea.  No mouth sores, diarrhea, cold sensitivity, or neuropathy symptoms.  He generally feels well.  Good appetite.  No bleeding or symptom of thrombosis.  Objective:  Vital signs in last 24 hours:  Blood pressure 130/88, pulse 84, temperature 98.2 F (36.8 C), temperature source Oral, resp. rate 18, height 5\' 6"  (1.676 m), weight 245 lb (111.1 kg), SpO2 98 %.    HEENT: No thrush, 1-2 mm ulcer at the lateral left posterior palate Resp: Lungs clear bilaterally Cardio: Regular rate and rhythm GI: Nontender, no mass, no hepatosplenomegaly Vascular: No leg edema Neuro: Mild loss of vibratory sense at the fingertips bilaterally  Skin: Palms without erythema  Portacath/PICC-without erythema  Lab Results:  Lab Results  Component Value Date   WBC 4.1 12/04/2022   HGB 12.5 (L) 12/04/2022   HCT 38.4 (L) 12/04/2022   MCV 80.2 12/04/2022   PLT 128 (L) 12/04/2022   NEUTROABS 2.7 12/04/2022    CMP  Lab Results  Component Value Date   NA 136 12/04/2022   K 3.9 12/04/2022   CL 102 12/04/2022   CO2 26 12/04/2022   GLUCOSE 195 (H) 12/04/2022   BUN 10 12/04/2022   CREATININE 0.67 12/04/2022   CALCIUM 9.1 12/04/2022   PROT 7.1 12/04/2022   ALBUMIN 3.9 12/04/2022   AST 37 12/04/2022   ALT 37 12/04/2022   ALKPHOS 207 (H) 12/04/2022   BILITOT 1.1 12/04/2022   GFRNONAA >60 12/04/2022   GFRAA >60 05/10/2020    Lab Results  Component Value Date   CEA1 1.08 03/07/2021   CEA 35.38 (H) 12/04/2022    Lab Results  Component Value Date   INR 1.0 02/08/2022   LABPROT 13.5 02/08/2022    Imaging:  No results found.  Medications: I have reviewed the patient's current medications.   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 secondary to nausea (patient discontinued Xeloda on day 11 due to colonoscopy) Colonoscopy 07/09/2020-nodular mucosa at the colonic anastomosis, biopsied; patent end-to-side ileocolonic anastomosis characterized by healthy-appearing mucosa and visible sutures; nonbleeding internal hemorrhoids, biopsy with benign ulcerated anastomotic mucosa with granulation tissue Cycle 4 adjuvant Xeloda 07/19/2020, 2000 mg every morning and 1500 mg every afternoon Cycle 5 adjuvant Xeloda 08/09/2020 Cycle 6 adjuvant Xeloda 08/31/2019 Cycle 7 adjuvant Xeloda 09/20/2020 Cycle 8 adjuvant Xeloda 10/11/2020 CTs 03/07/2021-no evidence of recurrent disease, hepatic steatosis, slight wall thickening at the junction of the descending and horizontal duodenum Mild elevation of CEA 2023 12/01/2021-Guardant Reveal-ct DNA detected PET 12/30/2021-hypermetabolic right liver lesion, hypermetabolic area in a loop of mid small bowel with an SUV of 12.5, review of PET images at GI tumor conference 01/11/2022-small bowel uptake felt to be a benign finding MRI liver 01/10/2022-solitary 1.2 cm right liver lesion between segments 7 and 8 compatible with metastasis, subtle changes suggesting cirrhosis, hepatic steatosis CT enteroscopy 02/02/2022-small bowel loop  with hypermetabolic activity on PET continues to have a bandlike density along the margin felt to represent a diverticulum or adjacent nodal tissue.  Small bowel tumor not excluded.  3-4 mm left  omental nodule 02/08/2022-biopsy and ablation of solitary liver lesion, adenocarcinoma consistent with metastatic colorectal adenocarcinoma CTs 03/31/2022-unchanged circumstantial soft tissue thickening surrounding a loop of mid to distal small bowel with an adjacent nodular focus measuring 1.2 cm.,  Ablation cavity in segment 7, no other evidence of metastatic disease Diagnostic laparoscopy 05/26/2022-mid small bowel mass-resected, well to moderately differentiated adenocarcinoma, 0/2 lymph nodes, morphology consistent with a colon primary, tumor involved full-thickness including serosa with perineural and large vessel involvement; foundation 1-microsatellite stable, tumor mutation burden 2, K-ras Q61H CT abdomen/pelvis 07/10/2022-the right liver ablation defect is smaller, new subtle right liver lesion measuring 13 x 9 mm, enlarged lymph node in the right upper quadrant mesentery adjacent to surgical anastomosis PET 07/26/2022-new segment 6 liver lesion, enlarging hypermetabolic left omental lesion, there is another mildly hypermetabolic peritoneal implant, 7 mm omental nodule at the midline without hypermetabolism Cycle 1 FOLFOX 09/25/2022 Cycle 2 FOLFOX/bevacizumab 10/09/2022, 5-FU dose reduced secondary to mucositis Cycle 3 FOLFOX/bevacizumab 10/23/2022 Cycle 4 FOLFOX/bevacizumab 11/06/2022 Cycle 5 FOLFOX/bevacizumab 11/20/2022 Microcytic anemia secondary to #1-improved Colon polyps-sessile serrated adenoma of the descending colon, tubular adenomas with focal high-grade dysplasia of the transverse colon on colonoscopy 02/12/2020, tubular adenoma of the mid ascending colon on the surgical specimen 04/12/2020 Family history of colon cancer Depression Anxiety/panic attacks Hypertension Hyperlipidemia Nausea secondary to Xeloda?-Resolved Gastroesophageal reflux disease-improved following discontinuation of Xeloda Anemia, microcytic; ferritin 6 01/24/2022; stool positive for blood x3 01/25/2022; 02/02/2022 CT  abdomen/pelvis enterography-bowel loop demonstrating hypermetabolic activity continues to have a bandlike density along its margin possibly representing a diverticulum or adjacent nodal tissue, difficult to exclude small bowel tumor, 3 x 4 mm left omental nodule or lymph node, known right hepatic lobe tumor is occult on CT. Venofer weekly x3 beginning 03/03/2022 Negative capsule endoscopy 03/09/2022 Venofer 03/03/2022 for 3 weekly doses Venofer 05/03/2022, 05/19/2022 Venofer 07/28/2022, 08/04/2022 12.  Anterior left neck nodule 03/17/2022-cyst?,  Lymph node? 13.  Mucositis secondary to chemotherapy at office visit 10/03/2022-the 5-FU will be dose reduced with cycle 2 FOLFOX 14.  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  He has tolerated the chemotherapy well.  His clinical status is significantly improved.  The CEA is lower.  He will complete cycle 6 today.  He will undergo a restaging CT evaluation after this cycle.  He will call if he has significant nausea following this cycle of chemotherapy.  We will add home Decadron.  Mr. Eimers will return for an office visit and chemotherapy in 2 weeks.  Thornton Papas, MD  12/04/2022  9:29 AM

## 2022-12-04 NOTE — Progress Notes (Signed)
Patient seen by Dr. Truett Perna today  Vitals are within treatment parameters.  Labs reviewed by Dr. Truett Perna and are not all within treatment parameters. May proceed w/urine protein 30 and alk phos 207  Per physician team, patient is ready for treatment and there are NO modifications to the treatment plan.

## 2022-12-04 NOTE — Patient Instructions (Signed)
[facility]   Discharge Instructions: Thank you for choosing [facility] to provide your oncology and hematology care.   If you have a lab appointment with the [facility], please go directly to the [facility] and check in at the registration area.   Wear comfortable clothing and clothing appropriate for easy access to any Portacath or PICC line.   We strive to give you quality time with your provider. You may need to reschedule your appointment if you arrive late (15 or more minutes).  Arriving late affects you and other patients whose appointments are after yours.  Also, if you miss three or more appointments without notifying the office, you may be dismissed from the clinic at the provider's discretion.      For prescription refill requests, have your pharmacy contact our office and allow 72 hours for refills to be completed.    Today you received the following chemotherapy and/or immunotherapy agents Bevacizumab-adcd (VEGZELMA), Oxaliplatin (ELOXATIN), Leucovorin & Flourouracil (ADRUCIL).      To help prevent nausea and vomiting after your treatment, we encourage you to take your nausea medication as directed.  BELOW ARE SYMPTOMS THAT SHOULD BE REPORTED IMMEDIATELY: *FEVER GREATER THAN 100.4 F (38 C) OR HIGHER *CHILLS OR SWEATING *NAUSEA AND VOMITING THAT IS NOT CONTROLLED WITH YOUR NAUSEA MEDICATION *UNUSUAL SHORTNESS OF BREATH *UNUSUAL BRUISING OR BLEEDING *URINARY PROBLEMS (pain or burning when urinating, or frequent urination) *BOWEL PROBLEMS (unusual diarrhea, constipation, pain near the anus) TENDERNESS IN MOUTH AND THROAT WITH OR WITHOUT PRESENCE OF ULCERS (sore throat, sores in mouth, or a toothache) UNUSUAL RASH, SWELLING OR PAIN  UNUSUAL VAGINAL DISCHARGE OR ITCHING   Items with * indicate a potential emergency and should be followed up as soon as possible or go to the Emergency Department if any problems should  occur.  Please show the CHEMOTHERAPY ALERT CARD or IMMUNOTHERAPY ALERT CARD at check-in to the Emergency Department and triage nurse.  Should you have questions after your visit or need to cancel or reschedule your appointment, please contact [facility]  Dept: [phone]  and follow the prompts.  Office hours are 8:00 a.m. to 4:30 p.m. Monday - Friday. Please note that voicemails left after 4:00 p.m. may not be returned until the following business day.  We are closed weekends and major holidays. You have access to a nurse at all times for urgent questions. Please call the main number to the clinic Dept: [phone] and follow the prompts.   For any non-urgent questions, you may also contact your provider using MyChart. We now offer e-Visits for anyone 19 and older to request care online for non-urgent symptoms. For details visit [email or web address].   Also download the MyChart app! Go to the app store, search "MyChart", open the app, select [facility], and log in with your MyChart username and password.  Bevacizumab Injection What is this medication? BEVACIZUMAB (be va SIZ yoo mab) treats some types of cancer. It works by blocking a protein that causes cancer cells to grow and multiply. This helps to slow or stop the spread of cancer cells. It is a monoclonal antibody. This medicine may be used for other purposes; ask your health care provider or pharmacist if you have questions. COMMON BRAND NAME(S): Alymsys, Avastin, MVASI, Noah Charon What should I tell my care team before I take this medication? They need to know if you have any of these conditions: Blood clots Coughing up  blood Having or recent surgery Heart failure High blood pressure History of a connection between 2 or more body parts that do not usually connect (fistula) History of a tear in your stomach or intestines Protein in your urine An unusual or allergic reaction to bevacizumab,  other medications, foods, dyes, or preservatives Pregnant or trying to get pregnant Breast-feeding How should I use this medication? This medication is injected into a vein. It is given by your care team in a hospital or clinic setting. Talk to your care team the use of this medication in children. Special care may be needed. Overdosage: If you think you have taken too much of this medicine contact a poison control center or emergency room at once. NOTE: This medicine is only for you. Do not share this medicine with others. What if I miss a dose? Keep appointments for follow-up doses. It is important not to miss your dose. Call your care team if you are unable to keep an appointment. What may interact with this medication? Interactions are not expected. This list may not describe all possible interactions. Give your health care provider a list of all the medicines, herbs, non-prescription drugs, or dietary supplements you use. Also tell them if you smoke, drink alcohol, or use illegal drugs. Some items may interact with your medicine. What should I watch for while using this medication? Your condition will be monitored carefully while you are receiving this medication. You may need blood work while taking this medication. This medication may make you feel generally unwell. This is not uncommon as chemotherapy can affect healthy cells as well as cancer cells. Report any side effects. Continue your course of treatment even though you feel ill unless your care team tells you to stop. This medication may increase your risk to bruise or bleed. Call your care team if you notice any unusual bleeding. Before having surgery, talk to your care team to make sure it is ok. This medication can increase the risk of poor healing of your surgical site or wound. You will need to stop this medication for 28 days before surgery. After surgery, wait at least 28 days before restarting this medication. Make sure the  surgical site or wound is healed enough before restarting this medication. Talk to your care team if questions. Talk to your care team if you may be pregnant. Serious birth defects can occur if you take this medication during pregnancy and for 6 months after the last dose. Contraception is recommended while taking this medication and for 6 months after the last dose. Your care team can help you find the option that works for you. Do not breastfeed while taking this medication and for 6 months after the last dose. This medication can cause infertility. Talk to your care team if you are concerned about your fertility. What side effects may I notice from receiving this medication? Side effects that you should report to your care team as soon as possible: Allergic reactions--skin rash, itching, hives, swelling of the face, lips, tongue, or throat Bleeding--bloody or black, tar-like stools, vomiting blood or brown material that looks like coffee grounds, red or dark brown urine, small red or purple spots on skin, unusual bruising or bleeding Blood clot--pain, swelling, or warmth in the leg, shortness of breath, chest pain Heart attack--pain or tightness in the chest, shoulders, arms, or jaw, nausea, shortness of breath, cold or clammy skin, feeling faint or lightheaded Heart failure--shortness of breath, swelling of the ankles, feet, or hands,  sudden weight gain, unusual weakness or fatigue Increase in blood pressure Infection--fever, chills, cough, sore throat, wounds that don't heal, pain or trouble when passing urine, general feeling of discomfort or being unwell Infusion reactions--chest pain, shortness of breath or trouble breathing, feeling faint or lightheaded Kidney injury--decrease in the amount of urine, swelling of the ankles, hands, or feet Stomach pain that is severe, does not go away, or gets worse Stroke--sudden numbness or weakness of the face, arm, or leg, trouble speaking, confusion,  trouble walking, loss of balance or coordination, dizziness, severe headache, change in vision Sudden and severe headache, confusion, change in vision, seizures, which may be signs of posterior reversible encephalopathy syndrome (PRES) Side effects that usually do not require medical attention (report to your care team if they continue or are bothersome): Back pain Change in taste Diarrhea Dry skin Increased tears Nosebleed This list may not describe all possible side effects. Call your doctor for medical advice about side effects. You may report side effects to FDA at 1-800-FDA-1088. Where should I keep my medication? This medication is given in a hospital or clinic. It will not be stored at home. NOTE: This sheet is a summary. It may not cover all possible information. If you have questions about this medicine, talk to your doctor, pharmacist, or health care provider.  2023 Elsevier/Gold Standard (2021-12-16 00:00:00)  Oxaliplatin Injection What is this medication? OXALIPLATIN (ox AL i PLA tin) treats colorectal cancer. It works by slowing down the growth of cancer cells. This medicine may be used for other purposes; ask your health care provider or pharmacist if you have questions. COMMON BRAND NAME(S): Eloxatin What should I tell my care team before I take this medication? They need to know if you have any of these conditions: Heart disease History of irregular heartbeat or rhythm Liver disease Low blood cell levels (white cells, red cells, and platelets) Lung or breathing disease, such as asthma Take medications that treat or prevent blood clots Tingling of the fingers, toes, or other nerve disorder An unusual or allergic reaction to oxaliplatin, other medications, foods, dyes, or preservatives If you or your partner are pregnant or trying to get pregnant Breast-feeding How should I use this medication? This medication is injected into a vein. It is given by your care team in a  hospital or clinic setting. Talk to your care team about the use of this medication in children. Special care may be needed. Overdosage: If you think you have taken too much of this medicine contact a poison control center or emergency room at once. NOTE: This medicine is only for you. Do not share this medicine with others. What if I miss a dose? Keep appointments for follow-up doses. It is important not to miss a dose. Call your care team if you are unable to keep an appointment. What may interact with this medication? Do not take this medication with any of the following: Cisapride Dronedarone Pimozide Thioridazine This medication may also interact with the following: Aspirin and aspirin-like medications Certain medications that treat or prevent blood clots, such as warfarin, apixaban, dabigatran, and rivaroxaban Cisplatin Cyclosporine Diuretics Medications for infection, such as acyclovir, adefovir, amphotericin B, bacitracin, cidofovir, foscarnet, ganciclovir, gentamicin, pentamidine, vancomycin NSAIDs, medications for pain and inflammation, such as ibuprofen or naproxen Other medications that cause heart rhythm changes Pamidronate Zoledronic acid This list may not describe all possible interactions. Give your health care provider a list of all the medicines, herbs, non-prescription drugs, or dietary supplements you  use. Also tell them if you smoke, drink alcohol, or use illegal drugs. Some items may interact with your medicine. What should I watch for while using this medication? Your condition will be monitored carefully while you are receiving this medication. You may need blood work while taking this medication. This medication may make you feel generally unwell. This is not uncommon as chemotherapy can affect healthy cells as well as cancer cells. Report any side effects. Continue your course of treatment even though you feel ill unless your care team tells you to stop. This  medication may increase your risk of getting an infection. Call your care team for advice if you get a fever, chills, sore throat, or other symptoms of a cold or flu. Do not treat yourself. Try to avoid being around people who are sick. Avoid taking medications that contain aspirin, acetaminophen, ibuprofen, naproxen, or ketoprofen unless instructed by your care team. These medications may hide a fever. Be careful brushing or flossing your teeth or using a toothpick because you may get an infection or bleed more easily. If you have any dental work done, tell your dentist you are receiving this medication. This medication can make you more sensitive to cold. Do not drink cold drinks or use ice. Cover exposed skin before coming in contact with cold temperatures or cold objects. When out in cold weather wear warm clothing and cover your mouth and nose to warm the air that goes into your lungs. Tell your care team if you get sensitive to the cold. Talk to your care team if you or your partner are pregnant or think either of you might be pregnant. This medication can cause serious birth defects if taken during pregnancy and for 9 months after the last dose. A negative pregnancy test is required before starting this medication. A reliable form of contraception is recommended while taking this medication and for 9 months after the last dose. Talk to your care team about effective forms of contraception. Do not father a child while taking this medication and for 6 months after the last dose. Use a condom while having sex during this time period. Do not breastfeed while taking this medication and for 3 months after the last dose. This medication may cause infertility. Talk to your care team if you are concerned about your fertility. What side effects may I notice from receiving this medication? Side effects that you should report to your care team as soon as possible: Allergic reactions--skin rash, itching, hives,  swelling of the face, lips, tongue, or throat Bleeding--bloody or black, tar-like stools, vomiting blood or brown material that looks like coffee grounds, red or dark brown urine, small red or purple spots on skin, unusual bruising or bleeding Dry cough, shortness of breath or trouble breathing Heart rhythm changes--fast or irregular heartbeat, dizziness, feeling faint or lightheaded, chest pain, trouble breathing Infection--fever, chills, cough, sore throat, wounds that don't heal, pain or trouble when passing urine, general feeling of discomfort or being unwell Liver injury--right upper belly pain, loss of appetite, nausea, light-colored stool, dark yellow or brown urine, yellowing skin or eyes, unusual weakness or fatigue Low red blood cell level--unusual weakness or fatigue, dizziness, headache, trouble breathing Muscle injury--unusual weakness or fatigue, muscle pain, dark yellow or brown urine, decrease in amount of urine Pain, tingling, or numbness in the hands or feet Sudden and severe headache, confusion, change in vision, seizures, which may be signs of posterior reversible encephalopathy syndrome (PRES) Unusual bruising or bleeding  Side effects that usually do not require medical attention (report to your care team if they continue or are bothersome): Diarrhea Nausea Pain, redness, or swelling with sores inside the mouth or throat Unusual weakness or fatigue Vomiting This list may not describe all possible side effects. Call your doctor for medical advice about side effects. You may report side effects to FDA at 1-800-FDA-1088. Where should I keep my medication? This medication is given in a hospital or clinic. It will not be stored at home. NOTE: This sheet is a summary. It may not cover all possible information. If you have questions about this medicine, talk to your doctor, pharmacist, or health care provider.  2023 Elsevier/Gold Standard (2007-10-05 00:00:00)  Leucovorin  Injection What is this medication? LEUCOVORIN (loo koe VOR in) prevents side effects from certain medications, such as methotrexate. It works by increasing folate levels. This helps protect healthy cells in your body. It may also be used to treat anemia caused by low levels of folate. It can also be used with fluorouracil, a type of chemotherapy, to treat colorectal cancer. It works by increasing the effects of fluorouracil in the body. This medicine may be used for other purposes; ask your health care provider or pharmacist if you have questions. What should I tell my care team before I take this medication? They need to know if you have any of these conditions: Anemia from low levels of vitamin B12 in the blood An unusual or allergic reaction to leucovorin, folic acid, other medications, foods, dyes, or preservatives Pregnant or trying to get pregnant Breastfeeding How should I use this medication? This medication is injected into a vein or a muscle. It is given by your care team in a hospital or clinic setting. Talk to your care team about the use of this medication in children. Special care may be needed. Overdosage: If you think you have taken too much of this medicine contact a poison control center or emergency room at once. NOTE: This medicine is only for you. Do not share this medicine with others. What if I miss a dose? Keep appointments for follow-up doses. It is important not to miss your dose. Call your care team if you are unable to keep an appointment. What may interact with this medication? Capecitabine Fluorouracil Phenobarbital Phenytoin Primidone Trimethoprim;sulfamethoxazole This list may not describe all possible interactions. Give your health care provider a list of all the medicines, herbs, non-prescription drugs, or dietary supplements you use. Also tell them if you smoke, drink alcohol, or use illegal drugs. Some items may interact with your medicine. What should I  watch for while using this medication? Your condition will be monitored carefully while you are receiving this medication. This medication may increase the side effects of 5-fluorouracil. Tell your care team if you have diarrhea or mouth sores that do not get better or that get worse. What side effects may I notice from receiving this medication? Side effects that you should report to your care team as soon as possible: Allergic reactions--skin rash, itching, hives, swelling of the face, lips, tongue, or throat This list may not describe all possible side effects. Call your doctor for medical advice about side effects. You may report side effects to FDA at 1-800-FDA-1088. Where should I keep my medication? This medication is given in a hospital or clinic. It will not be stored at home. NOTE: This sheet is a summary. It may not cover all possible information. If you have questions about this  medicine, talk to your doctor, pharmacist, or health care provider.  2023 Elsevier/Gold Standard (2021-12-23 00:00:00)  Fluorouracil Injection What is this medication? FLUOROURACIL (flure oh YOOR a sil) treats some types of cancer. It works by slowing down the growth of cancer cells. This medicine may be used for other purposes; ask your health care provider or pharmacist if you have questions. COMMON BRAND NAME(S): Adrucil What should I tell my care team before I take this medication? They need to know if you have any of these conditions: Blood disorders Dihydropyrimidine dehydrogenase (DPD) deficiency Infection, such as chickenpox, cold sores, herpes Kidney disease Liver disease Poor nutrition Recent or ongoing radiation therapy An unusual or allergic reaction to fluorouracil, other medications, foods, dyes, or preservatives If you or your partner are pregnant or trying to get pregnant Breast-feeding How should I use this medication? This medication is injected into a vein. It is administered by  your care team in a hospital or clinic setting. Talk to your care team about the use of this medication in children. Special care may be needed. Overdosage: If you think you have taken too much of this medicine contact a poison control center or emergency room at once. NOTE: This medicine is only for you. Do not share this medicine with others. What if I miss a dose? Keep appointments for follow-up doses. It is important not to miss your dose. Call your care team if you are unable to keep an appointment. What may interact with this medication? Do not take this medication with any of the following: Live virus vaccines This medication may also interact with the following: Medications that treat or prevent blood clots, such as warfarin, enoxaparin, dalteparin This list may not describe all possible interactions. Give your health care provider a list of all the medicines, herbs, non-prescription drugs, or dietary supplements you use. Also tell them if you smoke, drink alcohol, or use illegal drugs. Some items may interact with your medicine. What should I watch for while using this medication? Your condition will be monitored carefully while you are receiving this medication. This medication may make you feel generally unwell. This is not uncommon as chemotherapy can affect healthy cells as well as cancer cells. Report any side effects. Continue your course of treatment even though you feel ill unless your care team tells you to stop. In some cases, you may be given additional medications to help with side effects. Follow all directions for their use. This medication may increase your risk of getting an infection. Call your care team for advice if you get a fever, chills, sore throat, or other symptoms of a cold or flu. Do not treat yourself. Try to avoid being around people who are sick. This medication may increase your risk to bruise or bleed. Call your care team if you notice any unusual  bleeding. Be careful brushing or flossing your teeth or using a toothpick because you may get an infection or bleed more easily. If you have any dental work done, tell your dentist you are receiving this medication. Avoid taking medications that contain aspirin, acetaminophen, ibuprofen, naproxen, or ketoprofen unless instructed by your care team. These medications may hide a fever. Do not treat diarrhea with over the counter products. Contact your care team if you have diarrhea that lasts more than 2 days or if it is severe and watery. This medication can make you more sensitive to the sun. Keep out of the sun. If you cannot avoid being in the  sun, wear protective clothing and sunscreen. Do not use sun lamps, tanning beds, or tanning booths. Talk to your care team if you or your partner wish to become pregnant or think you might be pregnant. This medication can cause serious birth defects if taken during pregnancy and for 3 months after the last dose. A reliable form of contraception is recommended while taking this medication and for 3 months after the last dose. Talk to your care team about effective forms of contraception. Do not father a child while taking this medication and for 3 months after the last dose. Use a condom while having sex during this time period. Do not breastfeed while taking this medication. This medication may cause infertility. Talk to your care team if you are concerned about your fertility. What side effects may I notice from receiving this medication? Side effects that you should report to your care team as soon as possible: Allergic reactions--skin rash, itching, hives, swelling of the face, lips, tongue, or throat Heart attack--pain or tightness in the chest, shoulders, arms, or jaw, nausea, shortness of breath, cold or clammy skin, feeling faint or lightheaded Heart failure--shortness of breath, swelling of the ankles, feet, or hands, sudden weight gain, unusual weakness  or fatigue Heart rhythm changes--fast or irregular heartbeat, dizziness, feeling faint or lightheaded, chest pain, trouble breathing High ammonia level--unusual weakness or fatigue, confusion, loss of appetite, nausea, vomiting, seizures Infection--fever, chills, cough, sore throat, wounds that don't heal, pain or trouble when passing urine, general feeling of discomfort or being unwell Low red blood cell level--unusual weakness or fatigue, dizziness, headache, trouble breathing Pain, tingling, or numbness in the hands or feet, muscle weakness, change in vision, confusion or trouble speaking, loss of balance or coordination, trouble walking, seizures Redness, swelling, and blistering of the skin over hands and feet Severe or prolonged diarrhea Unusual bruising or bleeding Side effects that usually do not require medical attention (report to your care team if they continue or are bothersome): Dry skin Headache Increased tears Nausea Pain, redness, or swelling with sores inside the mouth or throat Sensitivity to light Vomiting This list may not describe all possible side effects. Call your doctor for medical advice about side effects. You may report side effects to FDA at 1-800-FDA-1088. Where should I keep my medication? This medication is given in a hospital or clinic. It will not be stored at home. NOTE: This sheet is a summary. It may not cover all possible information. If you have questions about this medicine, talk to your doctor, pharmacist, or health care provider.  2023 Elsevier/Gold Standard (2021-12-13 00:00:00)  The chemotherapy medication bag should finish at 46 hours, 96 hours, or 7 days. For example, if your pump is scheduled for 46 hours and it was put on at 4:00 p.m., it should finish at 2:00 p.m. the day it is scheduled to come off regardless of your appointment time.     Estimated time to finish at 11:45 a.m. on Wednesday 12/06/2022.   If the display on your pump reads  "Low Volume" and it is beeping, take the batteries out of the pump and come to the cancer center for it to be taken off.   If the pump alarms go off prior to the pump reading "Low Volume" then call [phone] and someone can assist you.  If the plunger comes out and the chemotherapy medication is leaking out, please use your home chemo spill kit to clean up the spill. Do NOT use paper towels  or other household products.  If you have problems or questions regarding your pump, please call either [phone] (24 hours a day) or the cancer center Monday-Friday 8:00 a.m.- 4:30 p.m. at the clinic number and we will assist you. If you are unable to get assistance, then go to the nearest Emergency Department and ask the staff to contact the IV team for assistance.

## 2022-12-05 ENCOUNTER — Other Ambulatory Visit: Payer: Self-pay

## 2022-12-06 ENCOUNTER — Encounter: Payer: Self-pay | Admitting: Oncology

## 2022-12-06 ENCOUNTER — Inpatient Hospital Stay: Payer: Medicaid Other

## 2022-12-06 VITALS — BP 130/74 | HR 87 | Temp 98.6°F | Resp 20

## 2022-12-06 DIAGNOSIS — C189 Malignant neoplasm of colon, unspecified: Secondary | ICD-10-CM

## 2022-12-06 DIAGNOSIS — Z5111 Encounter for antineoplastic chemotherapy: Secondary | ICD-10-CM | POA: Diagnosis not present

## 2022-12-06 MED ORDER — SODIUM CHLORIDE 0.9% FLUSH
10.0000 mL | INTRAVENOUS | Status: DC | PRN
  Administered 2022-12-06: 10 mL

## 2022-12-06 MED ORDER — HEPARIN SOD (PORK) LOCK FLUSH 100 UNIT/ML IV SOLN
500.0000 [IU] | Freq: Once | INTRAVENOUS | Status: AC | PRN
  Administered 2022-12-06: 500 [IU]

## 2022-12-06 NOTE — Patient Instructions (Signed)

## 2022-12-14 ENCOUNTER — Ambulatory Visit (HOSPITAL_BASED_OUTPATIENT_CLINIC_OR_DEPARTMENT_OTHER)
Admission: RE | Admit: 2022-12-14 | Discharge: 2022-12-14 | Disposition: A | Discharge status: Home / Self Care | Payer: Medicaid Other | Source: Ambulatory Visit | Attending: Oncology | Admitting: Oncology

## 2022-12-14 ENCOUNTER — Inpatient Hospital Stay: Payer: Medicaid Other

## 2022-12-14 DIAGNOSIS — C787 Secondary malignant neoplasm of liver and intrahepatic bile duct: Secondary | ICD-10-CM | POA: Diagnosis present

## 2022-12-14 DIAGNOSIS — C189 Malignant neoplasm of colon, unspecified: Secondary | ICD-10-CM | POA: Diagnosis not present

## 2022-12-14 MED ORDER — HEPARIN SOD (PORK) LOCK FLUSH 100 UNIT/ML IV SOLN
500.0000 [IU] | Freq: Once | INTRAVENOUS | Status: AC
  Administered 2022-12-14: 500 [IU] via INTRAVENOUS

## 2022-12-14 MED ORDER — IOHEXOL 300 MG/ML  SOLN
100.0000 mL | Freq: Once | INTRAMUSCULAR | Status: AC | PRN
  Administered 2022-12-14: 85 mL via INTRAVENOUS

## 2022-12-17 ENCOUNTER — Other Ambulatory Visit: Payer: Self-pay | Admitting: Oncology

## 2022-12-18 ENCOUNTER — Inpatient Hospital Stay: Payer: Medicaid Other

## 2022-12-18 ENCOUNTER — Encounter: Payer: Self-pay | Admitting: Oncology

## 2022-12-18 ENCOUNTER — Inpatient Hospital Stay: Payer: Medicaid Other | Admitting: Oncology

## 2022-12-18 VITALS — BP 133/78 | HR 95 | Temp 98.1°F | Resp 18 | Ht 66.0 in | Wt 240.0 lb

## 2022-12-18 VITALS — BP 127/76 | HR 83 | Resp 18

## 2022-12-18 DIAGNOSIS — Z5111 Encounter for antineoplastic chemotherapy: Secondary | ICD-10-CM | POA: Diagnosis not present

## 2022-12-18 DIAGNOSIS — C787 Secondary malignant neoplasm of liver and intrahepatic bile duct: Secondary | ICD-10-CM | POA: Diagnosis not present

## 2022-12-18 DIAGNOSIS — C189 Malignant neoplasm of colon, unspecified: Secondary | ICD-10-CM | POA: Diagnosis not present

## 2022-12-18 LAB — CBC WITH DIFFERENTIAL (CANCER CENTER ONLY)
Abs Immature Granulocytes: 0.01 10*3/uL (ref 0.00–0.07)
Basophils Absolute: 0 10*3/uL (ref 0.0–0.1)
Basophils Relative: 1 %
Eosinophils Absolute: 0.1 10*3/uL (ref 0.0–0.5)
Eosinophils Relative: 3 %
HCT: 38.1 % — ABNORMAL LOW (ref 39.0–52.0)
Hemoglobin: 12.4 g/dL — ABNORMAL LOW (ref 13.0–17.0)
Immature Granulocytes: 0 %
Lymphocytes Relative: 23 %
Lymphs Abs: 0.9 10*3/uL (ref 0.7–4.0)
MCH: 26.4 pg (ref 26.0–34.0)
MCHC: 32.5 g/dL (ref 30.0–36.0)
MCV: 81.2 fL (ref 80.0–100.0)
Monocytes Absolute: 0.5 10*3/uL (ref 0.1–1.0)
Monocytes Relative: 12 %
Neutro Abs: 2.4 10*3/uL (ref 1.7–7.7)
Neutrophils Relative %: 61 %
Platelet Count: 110 10*3/uL — ABNORMAL LOW (ref 150–400)
RBC: 4.69 MIL/uL (ref 4.22–5.81)
RDW: 19.5 % — ABNORMAL HIGH (ref 11.5–15.5)
WBC Count: 3.8 10*3/uL — ABNORMAL LOW (ref 4.0–10.5)
nRBC: 0 % (ref 0.0–0.2)

## 2022-12-18 LAB — CMP (CANCER CENTER ONLY)
ALT: 36 U/L (ref 0–44)
AST: 43 U/L — ABNORMAL HIGH (ref 15–41)
Albumin: 3.7 g/dL (ref 3.5–5.0)
Alkaline Phosphatase: 188 U/L — ABNORMAL HIGH (ref 38–126)
Anion gap: 8 (ref 5–15)
BUN: 10 mg/dL (ref 6–20)
CO2: 25 mmol/L (ref 22–32)
Calcium: 8.9 mg/dL (ref 8.9–10.3)
Chloride: 102 mmol/L (ref 98–111)
Creatinine: 0.78 mg/dL (ref 0.61–1.24)
GFR, Estimated: >60 mL/min (ref >60)
Glucose, Bld: 260 mg/dL — ABNORMAL HIGH (ref 70–99)
Potassium: 3.7 mmol/L (ref 3.5–5.1)
Sodium: 135 mmol/L (ref 135–145)
Total Bilirubin: 1.2 mg/dL (ref 0.3–1.2)
Total Protein: 7.3 g/dL (ref 6.5–8.1)

## 2022-12-18 LAB — TOTAL PROTEIN, URINE DIPSTICK: Protein, ur: NEGATIVE mg/dL

## 2022-12-18 LAB — CEA (ACCESS): CEA (CHCC): 40.78 ng/mL — ABNORMAL HIGH (ref 0.00–5.00)

## 2022-12-18 MED ORDER — LEUCOVORIN CALCIUM INJECTION 350 MG
200.0000 mg/m2 | Freq: Once | INTRAVENOUS | Status: AC
  Administered 2022-12-18: 448 mg via INTRAVENOUS
  Filled 2022-12-18: qty 22.4

## 2022-12-18 MED ORDER — SODIUM CHLORIDE 0.9 % IV SOLN: Freq: Once | INTRAVENOUS | Status: AC

## 2022-12-18 MED ORDER — LEUCOVORIN CALCIUM INJECTION 350 MG
200.0000 mg/m2 | Freq: Once | INTRAVENOUS | Status: DC
  Filled 2022-12-18: qty 22.4

## 2022-12-18 MED ORDER — SODIUM CHLORIDE 0.9 % IV SOLN
10.0000 mg | Freq: Once | INTRAVENOUS | Status: AC
  Administered 2022-12-18: 10 mg via INTRAVENOUS
  Filled 2022-12-18: qty 10

## 2022-12-18 MED ORDER — DEXTROSE 5 % IV SOLN: Freq: Once | INTRAVENOUS | Status: AC

## 2022-12-18 MED ORDER — SODIUM CHLORIDE 0.9 % IV SOLN
1600.0000 mg/m2 | INTRAVENOUS | Status: DC
  Administered 2022-12-18: 3500 mg via INTRAVENOUS
  Filled 2022-12-18: qty 70

## 2022-12-18 MED ORDER — FLUOROURACIL CHEMO INJECTION 500 MG/10ML
200.0000 mg/m2 | Freq: Once | INTRAVENOUS | Status: AC
  Administered 2022-12-18: 450 mg via INTRAVENOUS
  Filled 2022-12-18: qty 9

## 2022-12-18 MED ORDER — SODIUM CHLORIDE 0.9 % IV SOLN
5.0000 mg/kg | Freq: Once | INTRAVENOUS | Status: AC
  Administered 2022-12-18: 500 mg via INTRAVENOUS
  Filled 2022-12-18: qty 20

## 2022-12-18 MED ORDER — OXALIPLATIN CHEMO INJECTION 100 MG/20ML
85.0000 mg/m2 | Freq: Once | INTRAVENOUS | Status: AC
  Administered 2022-12-18: 200 mg via INTRAVENOUS
  Filled 2022-12-18: qty 40

## 2022-12-18 MED ORDER — PALONOSETRON HCL INJECTION 0.25 MG/5ML
0.2500 mg | Freq: Once | INTRAVENOUS | Status: AC
  Administered 2022-12-18: 0.25 mg via INTRAVENOUS
  Filled 2022-12-18: qty 5

## 2022-12-18 NOTE — Progress Notes (Signed)
[facility] OFFICE PROGRESS NOTE   Diagnosis: Colon cancer  INTERVAL HISTORY:   Anthony Calhoun completed another cycle of FOLFOX/Avastin on 12/04/2022.  He reports cold sensitivity for 2 days following chemotherapy.  No nausea/vomiting or diarrhea.  No neuropathy symptoms at present.  Objective:  Vital signs in last 24 hours:  Blood pressure 133/78, pulse 95, temperature 98.1 F (36.7 C), temperature source Oral, resp. rate 18, height 5\' 6"  (1.676 m), weight 240 lb (108.9 kg), SpO2 98 %.    HEENT: No thrush or ulcers Resp: Lungs clear bilaterally Cardio: Regular rate and rhythm GI: Nontender, no mass, no hepatosplenomegaly Vascular: No leg edema  Neurologic: Mild loss of vibratory sense at the fingertips bilaterally  Portacath/PICC-without erythema  Lab Results:  Lab Results  Component Value Date   WBC 3.8 (L) 12/18/2022   HGB 12.4 (L) 12/18/2022   HCT 38.1 (L) 12/18/2022   MCV 81.2 12/18/2022   PLT 110 (L) 12/18/2022   NEUTROABS 2.4 12/18/2022    CMP  Lab Results  Component Value Date   NA 135 12/18/2022   K 3.7 12/18/2022   CL 102 12/18/2022   CO2 25 12/18/2022   GLUCOSE 260 (H) 12/18/2022   BUN 10 12/18/2022   CREATININE 0.78 12/18/2022   CALCIUM 8.9 12/18/2022   PROT 7.3 12/18/2022   ALBUMIN 3.7 12/18/2022   AST 43 (H) 12/18/2022   ALT 36 12/18/2022   ALKPHOS 188 (H) 12/18/2022   BILITOT 1.2 12/18/2022   GFRNONAA >60 12/18/2022   GFRAA >60 05/10/2020    Lab Results  Component Value Date   CEA1 1.08 03/07/2021   CEA 40.78 (H) 12/18/2022    Lab Results  Component Value Date   INR 1.0 02/08/2022   LABPROT 13.5 02/08/2022    Imaging:  CT ABDOMEN PELVIS W CONTRAST  Result Date: 12/15/2022 CLINICAL DATA:  Restaging metastatic colon cancer. * Tracking Code: BO * EXAM: CT ABDOMEN AND PELVIS WITH CONTRAST TECHNIQUE: Multidetector CT imaging of the abdomen and pelvis was performed using the standard protocol following bolus  administration of intravenous contrast. RADIATION DOSE REDUCTION: This exam was performed according to the departmental dose-optimization program which includes automated exposure control, adjustment of the mA and/or kV according to patient size and/or use of iterative reconstruction technique. CONTRAST:  85mL OMNIPAQUE IOHEXOL 300 MG/ML  SOLN COMPARISON:  07/10/2022 FINDINGS: Lower chest: The lung bases are clear of acute process. No pleural effusion or pulmonary lesions. The heart is normal in size. No pericardial effusion. The distal esophagus and aorta are unremarkable. Hepatobiliary: The treated segment 7 lesion shows further contraction. It measures 2.2 x 1.4 cm and previously measured 3.0 x 2.1 cm. The segment 6 lesion measures approximately 15 x 15 mm and previously measured 13 x 9 mm. No new hepatic lesions. No intrahepatic biliary dilatation. Gallbladder is unremarkable. Pancreas: No mass, inflammation or ductal dilatation. Spleen: Stable splenomegaly.  No splenic lesions. Adrenals/Urinary Tract: Adrenal glands and kidneys are unremarkable and stable. No bladder lesions. Stomach/Bowel: The stomach, duodenum, small and colon are unremarkable. No acute inflammatory process, mass lesions or obstructive findings. Stable surgical changes involving the cecum and ascending colon. Vascular/Lymphatic: The aorta and branch vessels are patent. Scattered atherosclerotic calcifications are stable. Small scattered retroperitoneal lymph nodes are stable. Ileocolic lymph node on image 48/2 measures 22 x 14 mm and previously measured 18 x 13 mm. Slightly more inferiorly there is a 10 mm node which previously measured 4 mm. Omental lesion on image 42/2 measures  26 x 23 mm and previously measured 19 x 15 mm. Reproductive: The prostate gland and seminal vesicles are unremarkable. Other: No ascites or abdominal wall hernia. No subcutaneous lesions. Musculoskeletal: No significant bony findings. IMPRESSION: 1. Interval  contraction of the treated segment 7 hepatic lesion. The segment 6 lesion is slightly larger. No new hepatic lesions. 2. Slight interval enlargement of the ileocolic lymph nodes and omental lesion. 3. Stable surgical changes involving the cecum and ascending colon. 4. Stable splenomegaly. 5. Stable small scattered retroperitoneal lymph nodes. Aortic Atherosclerosis (ICD10-I70.0). Electronically Signed   By: Rudie Meyer M.D.   On: 12/15/2022 17:14    Medications: I have reviewed the patient's current medications.   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 secondary to nausea (patient discontinued Xeloda on day 11 due to colonoscopy) Colonoscopy 07/09/2020-nodular mucosa at the colonic anastomosis, biopsied; patent end-to-side ileocolonic anastomosis characterized by healthy-appearing mucosa and visible sutures; nonbleeding internal hemorrhoids, biopsy with benign ulcerated anastomotic mucosa with granulation tissue Cycle 4 adjuvant Xeloda 07/19/2020, 2000 mg every morning and 1500 mg every afternoon Cycle 5 adjuvant Xeloda 08/09/2020 Cycle 6 adjuvant Xeloda 08/31/2019 Cycle 7 adjuvant Xeloda 09/20/2020 Cycle 8 adjuvant Xeloda 10/11/2020 CTs 03/07/2021-no evidence of recurrent disease, hepatic steatosis, slight wall thickening at the junction of the descending and horizontal duodenum Mild elevation of CEA 2023 12/01/2021-Guardant Reveal-ct DNA detected PET 12/30/2021-hypermetabolic right liver lesion, hypermetabolic area in  a loop of mid small bowel with an SUV of 12.5, review of PET images at GI tumor conference 01/11/2022-small bowel uptake felt to be a benign finding MRI liver 01/10/2022-solitary 1.2 cm right liver lesion between segments 7 and 8 compatible with metastasis, subtle changes suggesting cirrhosis, hepatic steatosis CT enteroscopy 02/02/2022-small bowel loop with hypermetabolic activity on PET continues to have a bandlike density along the margin felt to represent a diverticulum or adjacent nodal tissue.  Small bowel tumor not excluded.  3-4 mm left omental nodule 02/08/2022-biopsy and ablation of solitary liver lesion, adenocarcinoma consistent with metastatic colorectal adenocarcinoma CTs 03/31/2022-unchanged circumstantial soft tissue thickening surrounding a loop of mid to distal small bowel with an adjacent nodular focus measuring 1.2 cm.,  Ablation cavity in segment 7, no other evidence of metastatic disease Diagnostic laparoscopy 05/26/2022-mid small bowel mass-resected, well to moderately differentiated adenocarcinoma, 0/2 lymph nodes, morphology consistent with a colon primary, tumor involved full-thickness including serosa with perineural and large vessel involvement; foundation 1-microsatellite stable, tumor mutation burden 2, K-ras Q61H CT abdomen/pelvis 07/10/2022-the right liver ablation defect is smaller, new subtle right liver lesion measuring 13 x 9 mm, enlarged lymph node in the right upper quadrant mesentery adjacent to surgical anastomosis PET 07/26/2022-new segment 6 liver lesion, enlarging hypermetabolic left omental lesion, there is another mildly hypermetabolic peritoneal implant, 7 mm omental nodule at the midline without hypermetabolism Cycle 1 FOLFOX 09/25/2022 Cycle 2 FOLFOX/bevacizumab 10/09/2022, 5-FU dose reduced secondary to mucositis Cycle 3 FOLFOX/bevacizumab 10/23/2022 Cycle 4 FOLFOX/bevacizumab 11/06/2022 Cycle 5 FOLFOX/bevacizumab 11/20/2022 Cycle 6 FOLFOX/bevacizumab 12/04/2022 CT  abdomen/pelvis 12/14/2022-further contraction of the treated segment 7 lesion, slight enlargement of segment 6 lesion, no new liver lesion, increased  size of ileocolic nodes and an omental lesion, stable small retroperitoneal nodes Cycle 7 FOLFOX/bevacizumab 12/18/2022 Microcytic anemia secondary to #1-improved Colon polyps-sessile serrated adenoma of the descending colon, tubular adenomas with focal high-grade dysplasia of the transverse colon on colonoscopy 02/12/2020, tubular adenoma of the mid ascending colon on the surgical specimen 04/12/2020 Family history of colon cancer Depression Anxiety/panic attacks Hypertension Hyperlipidemia Nausea secondary to Xeloda?-Resolved Gastroesophageal reflux disease-improved following discontinuation of Xeloda Anemia, microcytic; ferritin 6 01/24/2022; stool positive for blood x3 01/25/2022; 02/02/2022 CT abdomen/pelvis enterography-bowel loop demonstrating hypermetabolic activity continues to have a bandlike density along its margin possibly representing a diverticulum or adjacent nodal tissue, difficult to exclude small bowel tumor, 3 x 4 mm left omental nodule or lymph node, known right hepatic lobe tumor is occult on CT. Venofer weekly x3 beginning 03/03/2022 Negative capsule endoscopy 03/09/2022 Venofer 03/03/2022 for 3 weekly doses Venofer 05/03/2022, 05/19/2022 Venofer 07/28/2022, 08/04/2022 12.  Anterior left neck nodule 03/17/2022-cyst?,  Lymph node? 13.  Mucositis secondary to chemotherapy at office visit 10/03/2022-the 5-FU will be dose reduced with cycle 2 FOLFOX 14.  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  Oxaliplatin neuropathy-mild loss of vibratory sense 12/04/2022, 12/18/2022         Disposition: Anthony Calhoun has metastatic colon cancer.  He has completed 6 cycles of FOLFOX/bevacizumab.  The CEA is lower and his clinical status has improved.  I reviewed the CT findings and images with him.  There has been slight  enlargement of a liver lesion, omental lesion, and ileocolic nodes.  However there was a greater than 1-month interval between the baseline CT and initiation of systemic therapy.  The clinical course is most consistent with a response to FOLFOX/bevacizumab.  I recommend continuing FOLFOX/bevacizumab.  He agrees.  The plan is to complete 4 more cycles of FOLFOX/bevacizumab prior to another restaging CT evaluation.  We will continue following the CEA closely.  He will complete another cycle of chemotherapy today.  He will return for an office visit and chemotherapy in 2 weeks.  Thornton Papas, MD  12/18/2022  9:37 AM

## 2022-12-18 NOTE — Patient Instructions (Addendum)
Anthony Calhoun   Discharge Instructions: Thank you for choosing [facility] to provide your oncology and hematology care.   If you have a lab appointment with the [facility], please go directly to the [facility] and check in at the registration area.   Wear comfortable clothing and clothing appropriate for easy access to any Portacath or PICC line.   We strive to give you quality time with your provider. You may need to reschedule your appointment if you arrive late (15 or more minutes).  Arriving late affects you and other patients whose appointments are after yours.  Also, if you miss three or more appointments without notifying the office, you may be dismissed from the clinic at the provider's discretion.      For prescription refill requests, have your pharmacy contact our office and allow 72 hours for refills to be completed.    Today you received the following chemotherapy and/or immunotherapy agents Bevacizumab-adcd (VEGZELMA), Oxaliplatin (ELOXATIN), Leucovorin & Flourouracil (ADRUCIL).      To help prevent nausea and vomiting after your treatment, we encourage you to take your nausea medication as directed.  BELOW ARE SYMPTOMS THAT SHOULD BE REPORTED IMMEDIATELY: *FEVER GREATER THAN 100.4 F (38 C) OR HIGHER *CHILLS OR SWEATING *NAUSEA AND VOMITING THAT IS NOT CONTROLLED WITH YOUR NAUSEA MEDICATION *UNUSUAL SHORTNESS OF BREATH *UNUSUAL BRUISING OR BLEEDING *URINARY PROBLEMS (pain or burning when urinating, or frequent urination) *BOWEL PROBLEMS (unusual diarrhea, constipation, pain near the anus) TENDERNESS IN MOUTH AND THROAT WITH OR WITHOUT PRESENCE OF ULCERS (sore throat, sores in mouth, or a toothache) UNUSUAL RASH, SWELLING OR PAIN  UNUSUAL VAGINAL DISCHARGE OR ITCHING   Items with * indicate a potential emergency and should be followed up as soon as possible or go to the Emergency Department if any problems should  occur.  Please show the CHEMOTHERAPY ALERT CARD or IMMUNOTHERAPY ALERT CARD at check-in to the Emergency Department and triage nurse.  Should you have questions after your visit or need to cancel or reschedule your appointment, please contact [facility]  Dept: [phone]  and follow the prompts.  Office hours are 8:00 a.m. to 4:30 p.m. Monday - Friday. Please note that voicemails left after 4:00 p.m. may not be returned until the following business day.  We are closed weekends and major holidays. You have access to a nurse at all times for urgent questions. Please call the main number to the clinic Dept: [phone] and follow the prompts.   For any non-urgent questions, you may also contact your provider using MyChart. We now offer e-Visits for anyone 19 and older to request care online for non-urgent symptoms. For details visit [email or web address].   Also download the MyChart app! Go to the app store, search "MyChart", open the app, select [facility], and log in with your MyChart username and password.  Bevacizumab Injection What is this medication? BEVACIZUMAB (be va SIZ yoo mab) treats some types of cancer. It works by blocking a protein that causes cancer cells to grow and multiply. This helps to slow or stop the spread of cancer cells. It is a monoclonal antibody. This medicine may be used for other purposes; ask your health care provider or pharmacist if you have questions. COMMON BRAND NAME(S): Alymsys, Avastin, MVASI, Noah Charon What should I tell my care team before I take this medication? They need to know if you have any of these conditions: Blood clots Coughing up  blood Having or recent surgery Heart failure High blood pressure History of a connection between 2 or more body parts that do not usually connect (fistula) History of a tear in your stomach or intestines Protein in your urine An unusual or allergic reaction to bevacizumab,  other medications, foods, dyes, or preservatives Pregnant or trying to get pregnant Breast-feeding How should I use this medication? This medication is injected into a vein. It is given by your care team in a hospital or clinic setting. Talk to your care team the use of this medication in children. Special care may be needed. Overdosage: If you think you have taken too much of this medicine contact a poison control center or emergency room at once. NOTE: This medicine is only for you. Do not share this medicine with others. What if I miss a dose? Keep appointments for follow-up doses. It is important not to miss your dose. Call your care team if you are unable to keep an appointment. What may interact with this medication? Interactions are not expected. This list may not describe all possible interactions. Give your health care provider a list of all the medicines, herbs, non-prescription drugs, or dietary supplements you use. Also tell them if you smoke, drink alcohol, or use illegal drugs. Some items may interact with your medicine. What should I watch for while using this medication? Your condition will be monitored carefully while you are receiving this medication. You may need blood work while taking this medication. This medication may make you feel generally unwell. This is not uncommon as chemotherapy can affect healthy cells as well as cancer cells. Report any side effects. Continue your course of treatment even though you feel ill unless your care team tells you to stop. This medication may increase your risk to bruise or bleed. Call your care team if you notice any unusual bleeding. Before having surgery, talk to your care team to make sure it is ok. This medication can increase the risk of poor healing of your surgical site or wound. You will need to stop this medication for 28 days before surgery. After surgery, wait at least 28 days before restarting this medication. Make sure the  surgical site or wound is healed enough before restarting this medication. Talk to your care team if questions. Talk to your care team if you may be pregnant. Serious birth defects can occur if you take this medication during pregnancy and for 6 months after the last dose. Contraception is recommended while taking this medication and for 6 months after the last dose. Your care team can help you find the option that works for you. Do not breastfeed while taking this medication and for 6 months after the last dose. This medication can cause infertility. Talk to your care team if you are concerned about your fertility. What side effects may I notice from receiving this medication? Side effects that you should report to your care team as soon as possible: Allergic reactions--skin rash, itching, hives, swelling of the face, lips, tongue, or throat Bleeding--bloody or black, tar-like stools, vomiting blood or brown material that looks like coffee grounds, red or dark brown urine, small red or purple spots on skin, unusual bruising or bleeding Blood clot--pain, swelling, or warmth in the leg, shortness of breath, chest pain Heart attack--pain or tightness in the chest, shoulders, arms, or jaw, nausea, shortness of breath, cold or clammy skin, feeling faint or lightheaded Heart failure--shortness of breath, swelling of the ankles, feet, or hands,  sudden weight gain, unusual weakness or fatigue Increase in blood pressure Infection--fever, chills, cough, sore throat, wounds that don't heal, pain or trouble when passing urine, general feeling of discomfort or being unwell Infusion reactions--chest pain, shortness of breath or trouble breathing, feeling faint or lightheaded Kidney injury--decrease in the amount of urine, swelling of the ankles, hands, or feet Stomach pain that is severe, does not go away, or gets worse Stroke--sudden numbness or weakness of the face, arm, or leg, trouble speaking, confusion,  trouble walking, loss of balance or coordination, dizziness, severe headache, change in vision Sudden and severe headache, confusion, change in vision, seizures, which may be signs of posterior reversible encephalopathy syndrome (PRES) Side effects that usually do not require medical attention (report to your care team if they continue or are bothersome): Back pain Change in taste Diarrhea Dry skin Increased tears Nosebleed This list may not describe all possible side effects. Call your doctor for medical advice about side effects. You may report side effects to FDA at 1-800-FDA-1088. Where should I keep my medication? This medication is given in a hospital or clinic. It will not be stored at home. NOTE: This sheet is a summary. It may not cover all possible information. If you have questions about this medicine, talk to your doctor, pharmacist, or health care provider.  2023 Elsevier/Gold Standard (2021-12-16 00:00:00)   Oxaliplatin Injection What is this medication? OXALIPLATIN (ox AL i PLA tin) treats colorectal cancer. It works by slowing down the growth of cancer cells. This medicine may be used for other purposes; ask your health care provider or pharmacist if you have questions. COMMON BRAND NAME(S): Eloxatin What should I tell my care team before I take this medication? They need to know if you have any of these conditions: Heart disease History of irregular heartbeat or rhythm Liver disease Low blood cell levels (white cells, red cells, and platelets) Lung or breathing disease, such as asthma Take medications that treat or prevent blood clots Tingling of the fingers, toes, or other nerve disorder An unusual or allergic reaction to oxaliplatin, other medications, foods, dyes, or preservatives If you or your partner are pregnant or trying to get pregnant Breast-feeding How should I use this medication? This medication is injected into a vein. It is given by your care team in  a hospital or clinic setting. Talk to your care team about the use of this medication in children. Special care may be needed. Overdosage: If you think you have taken too much of this medicine contact a poison control center or emergency room at once. NOTE: This medicine is only for you. Do not share this medicine with others. What if I miss a dose? Keep appointments for follow-up doses. It is important not to miss a dose. Call your care team if you are unable to keep an appointment. What may interact with this medication? Do not take this medication with any of the following: Cisapride Dronedarone Pimozide Thioridazine This medication may also interact with the following: Aspirin and aspirin-like medications Certain medications that treat or prevent blood clots, such as warfarin, apixaban, dabigatran, and rivaroxaban Cisplatin Cyclosporine Diuretics Medications for infection, such as acyclovir, adefovir, amphotericin B, bacitracin, cidofovir, foscarnet, ganciclovir, gentamicin, pentamidine, vancomycin NSAIDs, medications for pain and inflammation, such as ibuprofen or naproxen Other medications that cause heart rhythm changes Pamidronate Zoledronic acid This list may not describe all possible interactions. Give your health care provider a list of all the medicines, herbs, non-prescription drugs, or dietary supplements  you use. Also tell them if you smoke, drink alcohol, or use illegal drugs. Some items may interact with your medicine. What should I watch for while using this medication? Your condition will be monitored carefully while you are receiving this medication. You may need blood work while taking this medication. This medication may make you feel generally unwell. This is not uncommon as chemotherapy can affect healthy cells as well as cancer cells. Report any side effects. Continue your course of treatment even though you feel ill unless your care team tells you to stop. This  medication may increase your risk of getting an infection. Call your care team for advice if you get a fever, chills, sore throat, or other symptoms of a cold or flu. Do not treat yourself. Try to avoid being around people who are sick. Avoid taking medications that contain aspirin, acetaminophen, ibuprofen, naproxen, or ketoprofen unless instructed by your care team. These medications may hide a fever. Be careful brushing or flossing your teeth or using a toothpick because you may get an infection or bleed more easily. If you have any dental work done, tell your dentist you are receiving this medication. This medication can make you more sensitive to cold. Do not drink cold drinks or use ice. Cover exposed skin before coming in contact with cold temperatures or cold objects. When out in cold weather wear warm clothing and cover your mouth and nose to warm the air that goes into your lungs. Tell your care team if you get sensitive to the cold. Talk to your care team if you or your partner are pregnant or think either of you might be pregnant. This medication can cause serious birth defects if taken during pregnancy and for 9 months after the last dose. A negative pregnancy test is required before starting this medication. A reliable form of contraception is recommended while taking this medication and for 9 months after the last dose. Talk to your care team about effective forms of contraception. Do not father a child while taking this medication and for 6 months after the last dose. Use a condom while having sex during this time period. Do not breastfeed while taking this medication and for 3 months after the last dose. This medication may cause infertility. Talk to your care team if you are concerned about your fertility. What side effects may I notice from receiving this medication? Side effects that you should report to your care team as soon as possible: Allergic reactions--skin rash, itching, hives,  swelling of the face, lips, tongue, or throat Bleeding--bloody or black, tar-like stools, vomiting blood or brown material that looks like coffee grounds, red or dark brown urine, small red or purple spots on skin, unusual bruising or bleeding Dry cough, shortness of breath or trouble breathing Heart rhythm changes--fast or irregular heartbeat, dizziness, feeling faint or lightheaded, chest pain, trouble breathing Infection--fever, chills, cough, sore throat, wounds that don't heal, pain or trouble when passing urine, general feeling of discomfort or being unwell Liver injury--right upper belly pain, loss of appetite, nausea, light-colored stool, dark yellow or brown urine, yellowing skin or eyes, unusual weakness or fatigue Low red blood cell level--unusual weakness or fatigue, dizziness, headache, trouble breathing Muscle injury--unusual weakness or fatigue, muscle pain, dark yellow or brown urine, decrease in amount of urine Pain, tingling, or numbness in the hands or feet Sudden and severe headache, confusion, change in vision, seizures, which may be signs of posterior reversible encephalopathy syndrome (PRES) Unusual bruising or  bleeding Side effects that usually do not require medical attention (report to your care team if they continue or are bothersome): Diarrhea Nausea Pain, redness, or swelling with sores inside the mouth or throat Unusual weakness or fatigue Vomiting This list may not describe all possible side effects. Call your doctor for medical advice about side effects. You may report side effects to FDA at 1-800-FDA-1088. Where should I keep my medication? This medication is given in a hospital or clinic. It will not be stored at home. NOTE: This sheet is a summary. It may not cover all possible information. If you have questions about this medicine, talk to your doctor, pharmacist, or health care provider.  2023 Elsevier/Gold Standard (2007-10-05 00:00:00)   Leucovorin  Injection What is this medication? LEUCOVORIN (loo koe VOR in) prevents side effects from certain medications, such as methotrexate. It works by increasing folate levels. This helps protect healthy cells in your body. It may also be used to treat anemia caused by low levels of folate. It can also be used with fluorouracil, a type of chemotherapy, to treat colorectal cancer. It works by increasing the effects of fluorouracil in the body. This medicine may be used for other purposes; ask your health care provider or pharmacist if you have questions. What should I tell my care team before I take this medication? They need to know if you have any of these conditions: Anemia from low levels of vitamin B12 in the blood An unusual or allergic reaction to leucovorin, folic acid, other medications, foods, dyes, or preservatives Pregnant or trying to get pregnant Breastfeeding How should I use this medication? This medication is injected into a vein or a muscle. It is given by your care team in a hospital or clinic setting. Talk to your care team about the use of this medication in children. Special care may be needed. Overdosage: If you think you have taken too much of this medicine contact a poison control center or emergency room at once. NOTE: This medicine is only for you. Do not share this medicine with others. What if I miss a dose? Keep appointments for follow-up doses. It is important not to miss your dose. Call your care team if you are unable to keep an appointment. What may interact with this medication? Capecitabine Fluorouracil Phenobarbital Phenytoin Primidone Trimethoprim;sulfamethoxazole This list may not describe all possible interactions. Give your health care provider a list of all the medicines, herbs, non-prescription drugs, or dietary supplements you use. Also tell them if you smoke, drink alcohol, or use illegal drugs. Some items may interact with your medicine. What should I  watch for while using this medication? Your condition will be monitored carefully while you are receiving this medication. This medication may increase the side effects of 5-fluorouracil. Tell your care team if you have diarrhea or mouth sores that do not get better or that get worse. What side effects may I notice from receiving this medication? Side effects that you should report to your care team as soon as possible: Allergic reactions--skin rash, itching, hives, swelling of the face, lips, tongue, or throat This list may not describe all possible side effects. Call your doctor for medical advice about side effects. You may report side effects to FDA at 1-800-FDA-1088. Where should I keep my medication? This medication is given in a hospital or clinic. It will not be stored at home. NOTE: This sheet is a summary. It may not cover all possible information. If you have questions  about this medicine, talk to your doctor, pharmacist, or health care provider.  2023 Elsevier/Gold Standard (2021-12-23 00:00:00)   Fluorouracil Injection What is this medication? FLUOROURACIL (flure oh YOOR a sil) treats some types of cancer. It works by slowing down the growth of cancer cells. This medicine may be used for other purposes; ask your health care provider or pharmacist if you have questions. COMMON BRAND NAME(S): Adrucil What should I tell my care team before I take this medication? They need to know if you have any of these conditions: Blood disorders Dihydropyrimidine dehydrogenase (DPD) deficiency Infection, such as chickenpox, cold sores, herpes Kidney disease Liver disease Poor nutrition Recent or ongoing radiation therapy An unusual or allergic reaction to fluorouracil, other medications, foods, dyes, or preservatives If you or your partner are pregnant or trying to get pregnant Breast-feeding How should I use this medication? This medication is injected into a vein. It is administered by  your care team in a hospital or clinic setting. Talk to your care team about the use of this medication in children. Special care may be needed. Overdosage: If you think you have taken too much of this medicine contact a poison control center or emergency room at once. NOTE: This medicine is only for you. Do not share this medicine with others. What if I miss a dose? Keep appointments for follow-up doses. It is important not to miss your dose. Call your care team if you are unable to keep an appointment. What may interact with this medication? Do not take this medication with any of the following: Live virus vaccines This medication may also interact with the following: Medications that treat or prevent blood clots, such as warfarin, enoxaparin, dalteparin This list may not describe all possible interactions. Give your health care provider a list of all the medicines, herbs, non-prescription drugs, or dietary supplements you use. Also tell them if you smoke, drink alcohol, or use illegal drugs. Some items may interact with your medicine. What should I watch for while using this medication? Your condition will be monitored carefully while you are receiving this medication. This medication may make you feel generally unwell. This is not uncommon as chemotherapy can affect healthy cells as well as cancer cells. Report any side effects. Continue your course of treatment even though you feel ill unless your care team tells you to stop. In some cases, you may be given additional medications to help with side effects. Follow all directions for their use. This medication may increase your risk of getting an infection. Call your care team for advice if you get a fever, chills, sore throat, or other symptoms of a cold or flu. Do not treat yourself. Try to avoid being around people who are sick. This medication may increase your risk to bruise or bleed. Call your care team if you notice any unusual  bleeding. Be careful brushing or flossing your teeth or using a toothpick because you may get an infection or bleed more easily. If you have any dental work done, tell your dentist you are receiving this medication. Avoid taking medications that contain aspirin, acetaminophen, ibuprofen, naproxen, or ketoprofen unless instructed by your care team. These medications may hide a fever. Do not treat diarrhea with over the counter products. Contact your care team if you have diarrhea that lasts more than 2 days or if it is severe and watery. This medication can make you more sensitive to the sun. Keep out of the sun. If you cannot avoid  being in the sun, wear protective clothing and sunscreen. Do not use sun lamps, tanning beds, or tanning booths. Talk to your care team if you or your partner wish to become pregnant or think you might be pregnant. This medication can cause serious birth defects if taken during pregnancy and for 3 months after the last dose. A reliable form of contraception is recommended while taking this medication and for 3 months after the last dose. Talk to your care team about effective forms of contraception. Do not father a child while taking this medication and for 3 months after the last dose. Use a condom while having sex during this time period. Do not breastfeed while taking this medication. This medication may cause infertility. Talk to your care team if you are concerned about your fertility. What side effects may I notice from receiving this medication? Side effects that you should report to your care team as soon as possible: Allergic reactions--skin rash, itching, hives, swelling of the face, lips, tongue, or throat Heart attack--pain or tightness in the chest, shoulders, arms, or jaw, nausea, shortness of breath, cold or clammy skin, feeling faint or lightheaded Heart failure--shortness of breath, swelling of the ankles, feet, or hands, sudden weight gain, unusual weakness  or fatigue Heart rhythm changes--fast or irregular heartbeat, dizziness, feeling faint or lightheaded, chest pain, trouble breathing High ammonia level--unusual weakness or fatigue, confusion, loss of appetite, nausea, vomiting, seizures Infection--fever, chills, cough, sore throat, wounds that don't heal, pain or trouble when passing urine, general feeling of discomfort or being unwell Low red blood cell level--unusual weakness or fatigue, dizziness, headache, trouble breathing Pain, tingling, or numbness in the hands or feet, muscle weakness, change in vision, confusion or trouble speaking, loss of balance or coordination, trouble walking, seizures Redness, swelling, and blistering of the skin over hands and feet Severe or prolonged diarrhea Unusual bruising or bleeding Side effects that usually do not require medical attention (report to your care team if they continue or are bothersome): Dry skin Headache Increased tears Nausea Pain, redness, or swelling with sores inside the mouth or throat Sensitivity to light Vomiting This list may not describe all possible side effects. Call your doctor for medical advice about side effects. You may report side effects to FDA at 1-800-FDA-1088. Where should I keep my medication? This medication is given in a hospital or clinic. It will not be stored at home. NOTE: This sheet is a summary. It may not cover all possible information. If you have questions about this medicine, talk to your doctor, pharmacist, or health care provider.  2023 [employer] Standard (2021-12-13 00:00:00)   The chemotherapy medication bag should finish at 46 hours, 96 hours, or 7 days. For example, if your pump is scheduled for 46 hours and it was put on at 4:00 p.m., it should finish at 2:00 p.m. the day it is scheduled to come off regardless of your appointment time.     Estimated time to finish at 12:15 p.m. on Wednesday 08/21/2023.   If the display on your pump reads  "Low Volume" and it is beeping, take the batteries out of the pump and come to the cancer center for it to be taken off.   If the pump alarms go off prior to the pump reading "Low Volume" then call [phone] and someone can assist you.  If the plunger comes out and the chemotherapy medication is leaking out, please use your home chemo spill kit to clean up the spill. Do  NOT use paper towels or other household products.  If you have problems or questions regarding your pump, please call either [phone] (24 hours a day) or the cancer center Monday-Friday 8:00 a.m.- 4:30 p.m. at the clinic number and we will assist you. If you are unable to get assistance, then go to the nearest Emergency Department and ask the staff to contact the IV team for assistance.

## 2022-12-18 NOTE — Progress Notes (Signed)
Patient seen by Dr. Sherrill today ? ?Vitals are within treatment parameters. ? ?Labs reviewed by Dr. Sherrill and are within treatment parameters. ? ?Per physician team, patient is ready for treatment and there are NO modifications to the treatment plan.  ?

## 2022-12-19 ENCOUNTER — Other Ambulatory Visit: Payer: Self-pay

## 2022-12-20 ENCOUNTER — Inpatient Hospital Stay: Payer: Medicaid Other

## 2022-12-20 VITALS — BP 131/77 | HR 80 | Temp 98.1°F | Resp 18

## 2022-12-20 DIAGNOSIS — Z5111 Encounter for antineoplastic chemotherapy: Secondary | ICD-10-CM | POA: Diagnosis not present

## 2022-12-20 DIAGNOSIS — C189 Malignant neoplasm of colon, unspecified: Secondary | ICD-10-CM

## 2022-12-20 MED ORDER — SODIUM CHLORIDE 0.9% FLUSH
10.0000 mL | INTRAVENOUS | Status: DC | PRN
  Administered 2022-12-20: 10 mL

## 2022-12-20 MED ORDER — HEPARIN SOD (PORK) LOCK FLUSH 100 UNIT/ML IV SOLN
500.0000 [IU] | Freq: Once | INTRAVENOUS | Status: AC | PRN
  Administered 2022-12-20: 500 [IU]

## 2022-12-20 NOTE — Patient Instructions (Signed)

## 2022-12-21 ENCOUNTER — Other Ambulatory Visit: Payer: Self-pay | Admitting: Oncology

## 2022-12-27 ENCOUNTER — Encounter: Payer: Self-pay | Admitting: Oncology

## 2022-12-30 ENCOUNTER — Other Ambulatory Visit: Payer: Self-pay | Admitting: Oncology

## 2023-01-02 ENCOUNTER — Inpatient Hospital Stay: Payer: Medicaid Other | Admitting: Licensed Clinical Social Worker

## 2023-01-02 ENCOUNTER — Encounter: Payer: Self-pay | Admitting: *Deleted

## 2023-01-02 ENCOUNTER — Other Ambulatory Visit: Payer: Self-pay | Admitting: *Deleted

## 2023-01-02 ENCOUNTER — Inpatient Hospital Stay: Payer: Medicaid Other

## 2023-01-02 ENCOUNTER — Inpatient Hospital Stay: Payer: Medicaid Other | Attending: Oncology

## 2023-01-02 ENCOUNTER — Encounter: Payer: Self-pay | Admitting: Oncology

## 2023-01-02 ENCOUNTER — Inpatient Hospital Stay: Payer: Medicaid Other | Admitting: Oncology

## 2023-01-02 VITALS — BP 127/78 | HR 97 | Temp 98.2°F | Resp 18 | Ht 66.0 in | Wt 239.0 lb

## 2023-01-02 VITALS — BP 125/79 | HR 85 | Resp 18

## 2023-01-02 DIAGNOSIS — C189 Malignant neoplasm of colon, unspecified: Secondary | ICD-10-CM | POA: Diagnosis not present

## 2023-01-02 DIAGNOSIS — C787 Secondary malignant neoplasm of liver and intrahepatic bile duct: Secondary | ICD-10-CM | POA: Diagnosis not present

## 2023-01-02 DIAGNOSIS — F32A Depression, unspecified: Secondary | ICD-10-CM | POA: Diagnosis not present

## 2023-01-02 DIAGNOSIS — K219 Gastro-esophageal reflux disease without esophagitis: Secondary | ICD-10-CM | POA: Insufficient documentation

## 2023-01-02 DIAGNOSIS — E785 Hyperlipidemia, unspecified: Secondary | ICD-10-CM | POA: Diagnosis not present

## 2023-01-02 DIAGNOSIS — Z800 Family history of malignant neoplasm of digestive organs: Secondary | ICD-10-CM | POA: Insufficient documentation

## 2023-01-02 DIAGNOSIS — D509 Iron deficiency anemia, unspecified: Secondary | ICD-10-CM | POA: Insufficient documentation

## 2023-01-02 DIAGNOSIS — Z5111 Encounter for antineoplastic chemotherapy: Secondary | ICD-10-CM | POA: Insufficient documentation

## 2023-01-02 DIAGNOSIS — Z86010 Personal history of colonic polyps: Secondary | ICD-10-CM | POA: Insufficient documentation

## 2023-01-02 DIAGNOSIS — F410 Panic disorder [episodic paroxysmal anxiety] without agoraphobia: Secondary | ICD-10-CM | POA: Diagnosis not present

## 2023-01-02 DIAGNOSIS — I10 Essential (primary) hypertension: Secondary | ICD-10-CM | POA: Insufficient documentation

## 2023-01-02 DIAGNOSIS — C182 Malignant neoplasm of ascending colon: Secondary | ICD-10-CM | POA: Insufficient documentation

## 2023-01-02 DIAGNOSIS — Z79899 Other long term (current) drug therapy: Secondary | ICD-10-CM | POA: Insufficient documentation

## 2023-01-02 LAB — CBC WITH DIFFERENTIAL (CANCER CENTER ONLY)
Abs Immature Granulocytes: 0.01 10*3/uL (ref 0.00–0.07)
Basophils Absolute: 0 10*3/uL (ref 0.0–0.1)
Basophils Relative: 1 %
Eosinophils Absolute: 0.1 10*3/uL (ref 0.0–0.5)
Eosinophils Relative: 3 %
HCT: 37.5 % — ABNORMAL LOW (ref 39.0–52.0)
Hemoglobin: 12 g/dL — ABNORMAL LOW (ref 13.0–17.0)
Immature Granulocytes: 0 %
Lymphocytes Relative: 19 %
Lymphs Abs: 0.6 10*3/uL — ABNORMAL LOW (ref 0.7–4.0)
MCH: 26.3 pg (ref 26.0–34.0)
MCHC: 32 g/dL (ref 30.0–36.0)
MCV: 82.1 fL (ref 80.0–100.0)
Monocytes Absolute: 0.4 10*3/uL (ref 0.1–1.0)
Monocytes Relative: 12 %
Neutro Abs: 2.1 10*3/uL (ref 1.7–7.7)
Neutrophils Relative %: 65 %
Platelet Count: 136 10*3/uL — ABNORMAL LOW (ref 150–400)
RBC: 4.57 MIL/uL (ref 4.22–5.81)
RDW: 18.9 % — ABNORMAL HIGH (ref 11.5–15.5)
WBC Count: 3.2 10*3/uL — ABNORMAL LOW (ref 4.0–10.5)
nRBC: 0 % (ref 0.0–0.2)

## 2023-01-02 LAB — CMP (CANCER CENTER ONLY)
ALT: 52 U/L — ABNORMAL HIGH (ref 0–44)
AST: 53 U/L — ABNORMAL HIGH (ref 15–41)
Albumin: 3.7 g/dL (ref 3.5–5.0)
Alkaline Phosphatase: 220 U/L — ABNORMAL HIGH (ref 38–126)
Anion gap: 8 (ref 5–15)
BUN: 9 mg/dL (ref 6–20)
CO2: 24 mmol/L (ref 22–32)
Calcium: 9 mg/dL (ref 8.9–10.3)
Chloride: 99 mmol/L (ref 98–111)
Creatinine: 0.73 mg/dL (ref 0.61–1.24)
GFR, Estimated: >60 mL/min (ref >60)
Glucose, Bld: 418 mg/dL — ABNORMAL HIGH (ref 70–99)
Potassium: 4 mmol/L (ref 3.5–5.1)
Sodium: 131 mmol/L — ABNORMAL LOW (ref 135–145)
Total Bilirubin: 1.4 mg/dL — ABNORMAL HIGH (ref 0.3–1.2)
Total Protein: 7.6 g/dL (ref 6.5–8.1)

## 2023-01-02 LAB — CEA (ACCESS): CEA (CHCC): 45.39 ng/mL — ABNORMAL HIGH (ref 0.00–5.00)

## 2023-01-02 LAB — TOTAL PROTEIN, URINE DIPSTICK: Protein, ur: NEGATIVE mg/dL

## 2023-01-02 MED ORDER — LIDOCAINE-PRILOCAINE 2.5-2.5 % EX CREA: 1.0000 | TOPICAL_CREAM | CUTANEOUS | 0 refills | Status: DC | PRN

## 2023-01-02 MED ORDER — SODIUM CHLORIDE 0.9 % IV SOLN
5.0000 mg/kg | Freq: Once | INTRAVENOUS | Status: AC
  Administered 2023-01-02: 500 mg via INTRAVENOUS
  Filled 2023-01-02: qty 16

## 2023-01-02 MED ORDER — PALONOSETRON HCL INJECTION 0.25 MG/5ML
0.2500 mg | Freq: Once | INTRAVENOUS | Status: AC
  Administered 2023-01-02: 0.25 mg via INTRAVENOUS
  Filled 2023-01-02: qty 5

## 2023-01-02 MED ORDER — INSULIN ASPART 100 UNIT/ML IJ SOLN
10.0000 [IU] | Freq: Once | INTRAMUSCULAR | Status: AC
  Administered 2023-01-02: 10 [IU] via SUBCUTANEOUS
  Filled 2023-01-02: qty 0.1

## 2023-01-02 MED ORDER — SODIUM CHLORIDE 0.9 % IV SOLN
1600.0000 mg/m2 | INTRAVENOUS | Status: DC
  Administered 2023-01-02: 3500 mg via INTRAVENOUS
  Filled 2023-01-02: qty 70

## 2023-01-02 MED ORDER — SODIUM CHLORIDE 0.9 % IV SOLN
150.0000 mg | Freq: Once | INTRAVENOUS | Status: AC
  Administered 2023-01-02: 150 mg via INTRAVENOUS
  Filled 2023-01-02: qty 150

## 2023-01-02 MED ORDER — DEXAMETHASONE SODIUM PHOSPHATE 10 MG/ML IJ SOLN
5.0000 mg | Freq: Once | INTRAMUSCULAR | Status: AC
  Administered 2023-01-02: 5 mg via INTRAVENOUS
  Filled 2023-01-02: qty 1

## 2023-01-02 MED ORDER — LEUCOVORIN CALCIUM INJECTION 350 MG
200.0000 mg/m2 | Freq: Once | INTRAVENOUS | Status: AC
  Administered 2023-01-02: 448 mg via INTRAVENOUS
  Filled 2023-01-02: qty 22.4

## 2023-01-02 MED ORDER — DEXTROSE 5 % IV SOLN: Freq: Once | INTRAVENOUS | Status: AC

## 2023-01-02 MED ORDER — SODIUM CHLORIDE 0.9 % IV SOLN: 5.0000 mg | Freq: Once | INTRAVENOUS | Status: DC

## 2023-01-02 MED ORDER — FLUOROURACIL CHEMO INJECTION 500 MG/10ML
200.0000 mg/m2 | Freq: Once | INTRAVENOUS | Status: AC
  Administered 2023-01-02: 450 mg via INTRAVENOUS
  Filled 2023-01-02: qty 9

## 2023-01-02 MED ORDER — LEUCOVORIN CALCIUM INJECTION 350 MG
200.0000 mg/m2 | Freq: Once | INTRAVENOUS | Status: DC
  Filled 2023-01-02: qty 22.4

## 2023-01-02 MED ORDER — OXALIPLATIN CHEMO INJECTION 100 MG/20ML
65.0000 mg/m2 | Freq: Once | INTRAVENOUS | Status: AC
  Administered 2023-01-02: 150 mg via INTRAVENOUS
  Filled 2023-01-02: qty 10

## 2023-01-02 MED ORDER — SODIUM CHLORIDE 0.9 % IV SOLN: Freq: Once | INTRAVENOUS | Status: AC

## 2023-01-02 NOTE — Progress Notes (Addendum)
Patient seen by Dr. Truett Perna today  Vitals are within treatment parameters.  Labs reviewed by Dr. Truett Perna and are within treatment parameters. OK to treat w/glucose 418  Per physician team, patient is ready for treatment. Please note that modifications are being made to the treatment plan including Delayed nausea: trying to add Emend and has dose reduced Oxaliplatin.  Also reduced decadron dose to 5 mg Please give 10 units regular insulin SQ today while in treatment room. Provided patient handout on diabetic nutrition and how to use EMLA cream.

## 2023-01-02 NOTE — Patient Instructions (Signed)
Anthony Calhoun   Discharge Instructions: Thank you for choosing [facility] to provide your oncology and hematology care.   If you have a lab appointment with the [facility], please go directly to the [facility] and check in at the registration area.   Wear comfortable clothing and clothing appropriate for easy access to any Portacath or PICC line.   We strive to give you quality time with your provider. You may need to reschedule your appointment if you arrive late (15 or more minutes).  Arriving late affects you and other patients whose appointments are after yours.  Also, if you miss three or more appointments without notifying the office, you may be dismissed from the clinic at the provider's discretion.      For prescription refill requests, have your pharmacy contact our office and allow 72 hours for refills to be completed.    Today you received the following chemotherapy and/or immunotherapy agents Bevacizumab-adcd (VEGZELMA), Oxaliplatin (ELOXATIN), Leucovorin & Flourouracil (ADRUCIL).      To help prevent nausea and vomiting after your treatment, we encourage you to take your nausea medication as directed.  BELOW ARE SYMPTOMS THAT SHOULD BE REPORTED IMMEDIATELY: *FEVER GREATER THAN 100.4 F (38 C) OR HIGHER *CHILLS OR SWEATING *NAUSEA AND VOMITING THAT IS NOT CONTROLLED WITH YOUR NAUSEA MEDICATION *UNUSUAL SHORTNESS OF BREATH *UNUSUAL BRUISING OR BLEEDING *URINARY PROBLEMS (pain or burning when urinating, or frequent urination) *BOWEL PROBLEMS (unusual diarrhea, constipation, pain near the anus) TENDERNESS IN MOUTH AND THROAT WITH OR WITHOUT PRESENCE OF ULCERS (sore throat, sores in mouth, or a toothache) UNUSUAL RASH, SWELLING OR PAIN  UNUSUAL VAGINAL DISCHARGE OR ITCHING   Items with * indicate a potential emergency and should be followed up as soon as possible or go to the Emergency Department if any problems should  occur.  Please show the CHEMOTHERAPY ALERT CARD or IMMUNOTHERAPY ALERT CARD at check-in to the Emergency Department and triage nurse.  Should you have questions after your visit or need to cancel or reschedule your appointment, please contact [facility]  Dept: [phone]  and follow the prompts.  Office hours are 8:00 a.m. to 4:30 p.m. Monday - Friday. Please note that voicemails left after 4:00 p.m. may not be returned until the following business day.  We are closed weekends and major holidays. You have access to a nurse at all times for urgent questions. Please call the main number to the clinic Dept: [phone] and follow the prompts.   For any non-urgent questions, you may also contact your provider using MyChart. We now offer e-Visits for anyone 19 and older to request care online for non-urgent symptoms. For details visit [email or web address].   Also download the MyChart app! Go to the app store, search "MyChart", open the app, select [facility], and log in with your MyChart username and password.  Bevacizumab Injection What is this medication? BEVACIZUMAB (be va SIZ yoo mab) treats some types of cancer. It works by blocking a protein that causes cancer cells to grow and multiply. This helps to slow or stop the spread of cancer cells. It is a monoclonal antibody. This medicine may be used for other purposes; ask your health care provider or pharmacist if you have questions. COMMON BRAND NAME(S): Alymsys, Avastin, MVASI, Noah Charon What should I tell my care team before I take this medication? They need to know if you have any of these conditions: Blood clots Coughing up  blood Having or recent surgery Heart failure High blood pressure History of a connection between 2 or more body parts that do not usually connect (fistula) History of a tear in your stomach or intestines Protein in your urine An unusual or allergic reaction to bevacizumab,  other medications, foods, dyes, or preservatives Pregnant or trying to get pregnant Breast-feeding How should I use this medication? This medication is injected into a vein. It is given by your care team in a hospital or clinic setting. Talk to your care team the use of this medication in children. Special care may be needed. Overdosage: If you think you have taken too much of this medicine contact a poison control center or emergency room at once. NOTE: This medicine is only for you. Do not share this medicine with others. What if I miss a dose? Keep appointments for follow-up doses. It is important not to miss your dose. Call your care team if you are unable to keep an appointment. What may interact with this medication? Interactions are not expected. This list may not describe all possible interactions. Give your health care provider a list of all the medicines, herbs, non-prescription drugs, or dietary supplements you use. Also tell them if you smoke, drink alcohol, or use illegal drugs. Some items may interact with your medicine. What should I watch for while using this medication? Your condition will be monitored carefully while you are receiving this medication. You may need blood work while taking this medication. This medication may make you feel generally unwell. This is not uncommon as chemotherapy can affect healthy cells as well as cancer cells. Report any side effects. Continue your course of treatment even though you feel ill unless your care team tells you to stop. This medication may increase your risk to bruise or bleed. Call your care team if you notice any unusual bleeding. Before having surgery, talk to your care team to make sure it is ok. This medication can increase the risk of poor healing of your surgical site or wound. You will need to stop this medication for 28 days before surgery. After surgery, wait at least 28 days before restarting this medication. Make sure the  surgical site or wound is healed enough before restarting this medication. Talk to your care team if questions. Talk to your care team if you may be pregnant. Serious birth defects can occur if you take this medication during pregnancy and for 6 months after the last dose. Contraception is recommended while taking this medication and for 6 months after the last dose. Your care team can help you find the option that works for you. Do not breastfeed while taking this medication and for 6 months after the last dose. This medication can cause infertility. Talk to your care team if you are concerned about your fertility. What side effects may I notice from receiving this medication? Side effects that you should report to your care team as soon as possible: Allergic reactions--skin rash, itching, hives, swelling of the face, lips, tongue, or throat Bleeding--bloody or black, tar-like stools, vomiting blood or brown material that looks like coffee grounds, red or dark brown urine, small red or purple spots on skin, unusual bruising or bleeding Blood clot--pain, swelling, or warmth in the leg, shortness of breath, chest pain Heart attack--pain or tightness in the chest, shoulders, arms, or jaw, nausea, shortness of breath, cold or clammy skin, feeling faint or lightheaded Heart failure--shortness of breath, swelling of the ankles, feet, or hands,  sudden weight gain, unusual weakness or fatigue Increase in blood pressure Infection--fever, chills, cough, sore throat, wounds that don't heal, pain or trouble when passing urine, general feeling of discomfort or being unwell Infusion reactions--chest pain, shortness of breath or trouble breathing, feeling faint or lightheaded Kidney injury--decrease in the amount of urine, swelling of the ankles, hands, or feet Stomach pain that is severe, does not go away, or gets worse Stroke--sudden numbness or weakness of the face, arm, or leg, trouble speaking, confusion,  trouble walking, loss of balance or coordination, dizziness, severe headache, change in vision Sudden and severe headache, confusion, change in vision, seizures, which may be signs of posterior reversible encephalopathy syndrome (PRES) Side effects that usually do not require medical attention (report to your care team if they continue or are bothersome): Back pain Change in taste Diarrhea Dry skin Increased tears Nosebleed This list may not describe all possible side effects. Call your doctor for medical advice about side effects. You may report side effects to FDA at 1-800-FDA-1088. Where should I keep my medication? This medication is given in a hospital or clinic. It will not be stored at home. NOTE: This sheet is a summary. It may not cover all possible information. If you have questions about this medicine, talk to your doctor, pharmacist, or health care provider.  2023 Elsevier/Gold Standard (2021-12-16 00:00:00)  Oxaliplatin Injection What is this medication? OXALIPLATIN (ox AL i PLA tin) treats colorectal cancer. It works by slowing down the growth of cancer cells. This medicine may be used for other purposes; ask your health care provider or pharmacist if you have questions. COMMON BRAND NAME(S): Eloxatin What should I tell my care team before I take this medication? They need to know if you have any of these conditions: Heart disease History of irregular heartbeat or rhythm Liver disease Low blood cell levels (white cells, red cells, and platelets) Lung or breathing disease, such as asthma Take medications that treat or prevent blood clots Tingling of the fingers, toes, or other nerve disorder An unusual or allergic reaction to oxaliplatin, other medications, foods, dyes, or preservatives If you or your partner are pregnant or trying to get pregnant Breast-feeding How should I use this medication? This medication is injected into a vein. It is given by your care team in a  hospital or clinic setting. Talk to your care team about the use of this medication in children. Special care may be needed. Overdosage: If you think you have taken too much of this medicine contact a poison control center or emergency room at once. NOTE: This medicine is only for you. Do not share this medicine with others. What if I miss a dose? Keep appointments for follow-up doses. It is important not to miss a dose. Call your care team if you are unable to keep an appointment. What may interact with this medication? Do not take this medication with any of the following: Cisapride Dronedarone Pimozide Thioridazine This medication may also interact with the following: Aspirin and aspirin-like medications Certain medications that treat or prevent blood clots, such as warfarin, apixaban, dabigatran, and rivaroxaban Cisplatin Cyclosporine Diuretics Medications for infection, such as acyclovir, adefovir, amphotericin B, bacitracin, cidofovir, foscarnet, ganciclovir, gentamicin, pentamidine, vancomycin NSAIDs, medications for pain and inflammation, such as ibuprofen or naproxen Other medications that cause heart rhythm changes Pamidronate Zoledronic acid This list may not describe all possible interactions. Give your health care provider a list of all the medicines, herbs, non-prescription drugs, or dietary supplements you  use. Also tell them if you smoke, drink alcohol, or use illegal drugs. Some items may interact with your medicine. What should I watch for while using this medication? Your condition will be monitored carefully while you are receiving this medication. You may need blood work while taking this medication. This medication may make you feel generally unwell. This is not uncommon as chemotherapy can affect healthy cells as well as cancer cells. Report any side effects. Continue your course of treatment even though you feel ill unless your care team tells you to stop. This  medication may increase your risk of getting an infection. Call your care team for advice if you get a fever, chills, sore throat, or other symptoms of a cold or flu. Do not treat yourself. Try to avoid being around people who are sick. Avoid taking medications that contain aspirin, acetaminophen, ibuprofen, naproxen, or ketoprofen unless instructed by your care team. These medications may hide a fever. Be careful brushing or flossing your teeth or using a toothpick because you may get an infection or bleed more easily. If you have any dental work done, tell your dentist you are receiving this medication. This medication can make you more sensitive to cold. Do not drink cold drinks or use ice. Cover exposed skin before coming in contact with cold temperatures or cold objects. When out in cold weather wear warm clothing and cover your mouth and nose to warm the air that goes into your lungs. Tell your care team if you get sensitive to the cold. Talk to your care team if you or your partner are pregnant or think either of you might be pregnant. This medication can cause serious birth defects if taken during pregnancy and for 9 months after the last dose. A negative pregnancy test is required before starting this medication. A reliable form of contraception is recommended while taking this medication and for 9 months after the last dose. Talk to your care team about effective forms of contraception. Do not father a child while taking this medication and for 6 months after the last dose. Use a condom while having sex during this time period. Do not breastfeed while taking this medication and for 3 months after the last dose. This medication may cause infertility. Talk to your care team if you are concerned about your fertility. What side effects may I notice from receiving this medication? Side effects that you should report to your care team as soon as possible: Allergic reactions--skin rash, itching, hives,  swelling of the face, lips, tongue, or throat Bleeding--bloody or black, tar-like stools, vomiting blood or brown material that looks like coffee grounds, red or dark brown urine, small red or purple spots on skin, unusual bruising or bleeding Dry cough, shortness of breath or trouble breathing Heart rhythm changes--fast or irregular heartbeat, dizziness, feeling faint or lightheaded, chest pain, trouble breathing Infection--fever, chills, cough, sore throat, wounds that don't heal, pain or trouble when passing urine, general feeling of discomfort or being unwell Liver injury--right upper belly pain, loss of appetite, nausea, light-colored stool, dark yellow or brown urine, yellowing skin or eyes, unusual weakness or fatigue Low red blood cell level--unusual weakness or fatigue, dizziness, headache, trouble breathing Muscle injury--unusual weakness or fatigue, muscle pain, dark yellow or brown urine, decrease in amount of urine Pain, tingling, or numbness in the hands or feet Sudden and severe headache, confusion, change in vision, seizures, which may be signs of posterior reversible encephalopathy syndrome (PRES) Unusual bruising or bleeding  Side effects that usually do not require medical attention (report to your care team if they continue or are bothersome): Diarrhea Nausea Pain, redness, or swelling with sores inside the mouth or throat Unusual weakness or fatigue Vomiting This list may not describe all possible side effects. Call your doctor for medical advice about side effects. You may report side effects to FDA at 1-800-FDA-1088. Where should I keep my medication? This medication is given in a hospital or clinic. It will not be stored at home. NOTE: This sheet is a summary. It may not cover all possible information. If you have questions about this medicine, talk to your doctor, pharmacist, or health care provider.  2023 Elsevier/Gold Standard (2007-10-05 00:00:00)  Leucovorin  Injection What is this medication? LEUCOVORIN (loo koe VOR in) prevents side effects from certain medications, such as methotrexate. It works by increasing folate levels. This helps protect healthy cells in your body. It may also be used to treat anemia caused by low levels of folate. It can also be used with fluorouracil, a type of chemotherapy, to treat colorectal cancer. It works by increasing the effects of fluorouracil in the body. This medicine may be used for other purposes; ask your health care provider or pharmacist if you have questions. What should I tell my care team before I take this medication? They need to know if you have any of these conditions: Anemia from low levels of vitamin B12 in the blood An unusual or allergic reaction to leucovorin, folic acid, other medications, foods, dyes, or preservatives Pregnant or trying to get pregnant Breastfeeding How should I use this medication? This medication is injected into a vein or a muscle. It is given by your care team in a hospital or clinic setting. Talk to your care team about the use of this medication in children. Special care may be needed. Overdosage: If you think you have taken too much of this medicine contact a poison control center or emergency room at once. NOTE: This medicine is only for you. Do not share this medicine with others. What if I miss a dose? Keep appointments for follow-up doses. It is important not to miss your dose. Call your care team if you are unable to keep an appointment. What may interact with this medication? Capecitabine Fluorouracil Phenobarbital Phenytoin Primidone Trimethoprim;sulfamethoxazole This list may not describe all possible interactions. Give your health care provider a list of all the medicines, herbs, non-prescription drugs, or dietary supplements you use. Also tell them if you smoke, drink alcohol, or use illegal drugs. Some items may interact with your medicine. What should I  watch for while using this medication? Your condition will be monitored carefully while you are receiving this medication. This medication may increase the side effects of 5-fluorouracil. Tell your care team if you have diarrhea or mouth sores that do not get better or that get worse. What side effects may I notice from receiving this medication? Side effects that you should report to your care team as soon as possible: Allergic reactions--skin rash, itching, hives, swelling of the face, lips, tongue, or throat This list may not describe all possible side effects. Call your doctor for medical advice about side effects. You may report side effects to FDA at 1-800-FDA-1088. Where should I keep my medication? This medication is given in a hospital or clinic. It will not be stored at home. NOTE: This sheet is a summary. It may not cover all possible information. If you have questions about this  medicine, talk to your doctor, pharmacist, or health care provider.  2023 Elsevier/Gold Standard (2021-12-23 00:00:00)  Fluorouracil Injection What is this medication? FLUOROURACIL (flure oh YOOR a sil) treats some types of cancer. It works by slowing down the growth of cancer cells. This medicine may be used for other purposes; ask your health care provider or pharmacist if you have questions. COMMON BRAND NAME(S): Adrucil What should I tell my care team before I take this medication? They need to know if you have any of these conditions: Blood disorders Dihydropyrimidine dehydrogenase (DPD) deficiency Infection, such as chickenpox, cold sores, herpes Kidney disease Liver disease Poor nutrition Recent or ongoing radiation therapy An unusual or allergic reaction to fluorouracil, other medications, foods, dyes, or preservatives If you or your partner are pregnant or trying to get pregnant Breast-feeding How should I use this medication? This medication is injected into a vein. It is administered by  your care team in a hospital or clinic setting. Talk to your care team about the use of this medication in children. Special care may be needed. Overdosage: If you think you have taken too much of this medicine contact a poison control center or emergency room at once. NOTE: This medicine is only for you. Do not share this medicine with others. What if I miss a dose? Keep appointments for follow-up doses. It is important not to miss your dose. Call your care team if you are unable to keep an appointment. What may interact with this medication? Do not take this medication with any of the following: Live virus vaccines This medication may also interact with the following: Medications that treat or prevent blood clots, such as warfarin, enoxaparin, dalteparin This list may not describe all possible interactions. Give your health care provider a list of all the medicines, herbs, non-prescription drugs, or dietary supplements you use. Also tell them if you smoke, drink alcohol, or use illegal drugs. Some items may interact with your medicine. What should I watch for while using this medication? Your condition will be monitored carefully while you are receiving this medication. This medication may make you feel generally unwell. This is not uncommon as chemotherapy can affect healthy cells as well as cancer cells. Report any side effects. Continue your course of treatment even though you feel ill unless your care team tells you to stop. In some cases, you may be given additional medications to help with side effects. Follow all directions for their use. This medication may increase your risk of getting an infection. Call your care team for advice if you get a fever, chills, sore throat, or other symptoms of a cold or flu. Do not treat yourself. Try to avoid being around people who are sick. This medication may increase your risk to bruise or bleed. Call your care team if you notice any unusual  bleeding. Be careful brushing or flossing your teeth or using a toothpick because you may get an infection or bleed more easily. If you have any dental work done, tell your dentist you are receiving this medication. Avoid taking medications that contain aspirin, acetaminophen, ibuprofen, naproxen, or ketoprofen unless instructed by your care team. These medications may hide a fever. Do not treat diarrhea with over the counter products. Contact your care team if you have diarrhea that lasts more than 2 days or if it is severe and watery. This medication can make you more sensitive to the sun. Keep out of the sun. If you cannot avoid being in the  sun, wear protective clothing and sunscreen. Do not use sun lamps, tanning beds, or tanning booths. Talk to your care team if you or your partner wish to become pregnant or think you might be pregnant. This medication can cause serious birth defects if taken during pregnancy and for 3 months after the last dose. A reliable form of contraception is recommended while taking this medication and for 3 months after the last dose. Talk to your care team about effective forms of contraception. Do not father a child while taking this medication and for 3 months after the last dose. Use a condom while having sex during this time period. Do not breastfeed while taking this medication. This medication may cause infertility. Talk to your care team if you are concerned about your fertility. What side effects may I notice from receiving this medication? Side effects that you should report to your care team as soon as possible: Allergic reactions--skin rash, itching, hives, swelling of the face, lips, tongue, or throat Heart attack--pain or tightness in the chest, shoulders, arms, or jaw, nausea, shortness of breath, cold or clammy skin, feeling faint or lightheaded Heart failure--shortness of breath, swelling of the ankles, feet, or hands, sudden weight gain, unusual weakness  or fatigue Heart rhythm changes--fast or irregular heartbeat, dizziness, feeling faint or lightheaded, chest pain, trouble breathing High ammonia level--unusual weakness or fatigue, confusion, loss of appetite, nausea, vomiting, seizures Infection--fever, chills, cough, sore throat, wounds that don't heal, pain or trouble when passing urine, general feeling of discomfort or being unwell Low red blood cell level--unusual weakness or fatigue, dizziness, headache, trouble breathing Pain, tingling, or numbness in the hands or feet, muscle weakness, change in vision, confusion or trouble speaking, loss of balance or coordination, trouble walking, seizures Redness, swelling, and blistering of the skin over hands and feet Severe or prolonged diarrhea Unusual bruising or bleeding Side effects that usually do not require medical attention (report to your care team if they continue or are bothersome): Dry skin Headache Increased tears Nausea Pain, redness, or swelling with sores inside the mouth or throat Sensitivity to light Vomiting This list may not describe all possible side effects. Call your doctor for medical advice about side effects. You may report side effects to FDA at 1-800-FDA-1088. Where should I keep my medication? This medication is given in a hospital or clinic. It will not be stored at home. NOTE: This sheet is a summary. It may not cover all possible information. If you have questions about this medicine, talk to your doctor, pharmacist, or health care provider.  2023 Elsevier/Gold Standard (2021-12-13 00:00:00)  The chemotherapy medication bag should finish at 46 hours, 96 hours, or 7 days. For example, if your pump is scheduled for 46 hours and it was put on at 4:00 p.m., it should finish at 2:00 p.m. the day it is scheduled to come off regardless of your appointment time.     Estimated time to finish at 12:00 p.m. on Thursday 01/04/2023.   If the display on your pump reads  "Low Volume" and it is beeping, take the batteries out of the pump and come to the cancer center for it to be taken off.   If the pump alarms go off prior to the pump reading "Low Volume" then call [phone] and someone can assist you.  If the plunger comes out and the chemotherapy medication is leaking out, please use your home chemo spill kit to clean up the spill. Do NOT use paper towels  or other household products.  If you have problems or questions regarding your pump, please call either [phone] (24 hours a day) or the cancer center Monday-Friday 8:00 a.m.- 4:30 p.m. at the clinic number and we will assist you. If you are unable to get assistance, then go to the nearest Emergency Department and ask the staff to contact the IV team for assistance.

## 2023-01-02 NOTE — Progress Notes (Signed)
[facility] CSW Progress Note  Visual merchandiser met with patient to assess needs.  Patient was receiving treatment and his girlfriend was present.  Patient discussed his current treatment regimen and reports nausea.  Patient is experiencing financial strain.  [employer] cards were given to patient for food and gas.  He stated he understood.    Dorothey Baseman, LCSW Clinical Social Worker [facility]    Patient is participating in a Managed Medicaid Plan:  Yes

## 2023-01-02 NOTE — Progress Notes (Signed)
[facility] OFFICE PROGRESS NOTE   Diagnosis: Colon cancer  INTERVAL HISTORY:   Anthony Calhoun completed another cycle of FOLFOX/bevacizumab 12/18/2022.  He reports prolonged nausea following chemotherapy.  The nausea is partially relieved with Phenergan.  No vomiting.  No mouth sores or diarrhea.  Cold sensitivity lasted a few days following chemotherapy.  No neuropathy symptoms at present.  He is not following a diabetic diet.  Objective:  Vital signs in last 24 hours:  Blood pressure 127/78, pulse 97, temperature 98.2 F (36.8 C), temperature source Oral, resp. rate 18, height 5\' 6"  (1.676 m), weight 239 lb (108.4 kg), SpO2 99 %.    HEENT: The mouth is dry, mild white coat over the tongue, no buccal thrush or ulcers Resp: Lungs clear bilaterally Cardio: Regular rate and rhythm GI: No hepatosplenomegaly, no mass, nontender Vascular: No leg edema Neuro: Mild loss of vibratory sense in the fingertips bilaterally,    Portacath/PICC-without erythema  Lab Results:  Lab Results  Component Value Date   WBC 3.2 (L) 01/02/2023   HGB 12.0 (L) 01/02/2023   HCT 37.5 (L) 01/02/2023   MCV 82.1 01/02/2023   PLT 136 (L) 01/02/2023   NEUTROABS 2.1 01/02/2023    CMP  Lab Results  Component Value Date   NA 131 (L) 01/02/2023   K 4.0 01/02/2023   CL 99 01/02/2023   CO2 24 01/02/2023   GLUCOSE 418 (H) 01/02/2023   BUN 9 01/02/2023   CREATININE 0.73 01/02/2023   CALCIUM 9.0 01/02/2023   PROT 7.6 01/02/2023   ALBUMIN 3.7 01/02/2023   AST 53 (H) 01/02/2023   ALT 52 (H) 01/02/2023   ALKPHOS 220 (H) 01/02/2023   BILITOT 1.4 (H) 01/02/2023   GFRNONAA >60 01/02/2023   GFRAA >60 05/10/2020    Lab Results  Component Value Date   CEA1 1.08 03/07/2021   CEA 40.78 (H) 12/18/2022     Medications: I have reviewed the patient's current medications.   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 secondary to nausea (patient discontinued Xeloda on day 11 due to colonoscopy) Colonoscopy 07/09/2020-nodular mucosa at the colonic anastomosis, biopsied; patent end-to-side ileocolonic anastomosis characterized by healthy-appearing mucosa and visible sutures; nonbleeding internal hemorrhoids, biopsy with benign ulcerated anastomotic mucosa with granulation tissue Cycle 4 adjuvant Xeloda 07/19/2020, 2000 mg every morning and 1500 mg every afternoon Cycle 5 adjuvant Xeloda 08/09/2020 Cycle 6 adjuvant Xeloda 08/31/2019 Cycle 7 adjuvant Xeloda 09/20/2020 Cycle 8 adjuvant Xeloda 10/11/2020 CTs 03/07/2021-no evidence of recurrent disease, hepatic steatosis, slight wall thickening at the junction of the descending and horizontal duodenum Mild elevation of CEA 2023 12/01/2021-Guardant Reveal-ct DNA detected PET 12/30/2021-hypermetabolic right liver lesion, hypermetabolic area in a loop of mid small bowel with an SUV of 12.5, review of PET images at GI tumor conference 01/11/2022-small bowel uptake felt to be a benign finding MRI liver 01/10/2022-solitary 1.2 cm right liver lesion between segments 7 and 8 compatible with metastasis, subtle changes suggesting cirrhosis, hepatic steatosis CT enteroscopy 02/02/2022-small bowel loop with hypermetabolic activity on PET continues to have a bandlike density along the margin felt  to represent a diverticulum or adjacent nodal tissue.  Small bowel tumor not excluded.  3-4 mm left omental nodule 02/08/2022-biopsy and ablation of solitary liver lesion, adenocarcinoma  consistent with metastatic colorectal adenocarcinoma CTs 03/31/2022-unchanged circumstantial soft tissue thickening surrounding a loop of mid to distal small bowel with an adjacent nodular focus measuring 1.2 cm.,  Ablation cavity in segment 7, no other evidence of metastatic disease Diagnostic laparoscopy 05/26/2022-mid small bowel mass-resected, well to moderately differentiated adenocarcinoma, 0/2 lymph nodes, morphology consistent with a colon primary, tumor involved full-thickness including serosa with perineural and large vessel involvement; foundation 1-microsatellite stable, tumor mutation burden 2, K-ras Q61H CT abdomen/pelvis 07/10/2022-the right liver ablation defect is smaller, new subtle right liver lesion measuring 13 x 9 mm, enlarged lymph node in the right upper quadrant mesentery adjacent to surgical anastomosis PET 07/26/2022-new segment 6 liver lesion, enlarging hypermetabolic left omental lesion, there is another mildly hypermetabolic peritoneal implant, 7 mm omental nodule at the midline without hypermetabolism Cycle 1 FOLFOX 09/25/2022 Cycle 2 FOLFOX/bevacizumab 10/09/2022, 5-FU dose reduced secondary to mucositis Cycle 3 FOLFOX/bevacizumab 10/23/2022 Cycle 4 FOLFOX/bevacizumab 11/06/2022 Cycle 5 FOLFOX/bevacizumab 11/20/2022 Cycle 6 FOLFOX/bevacizumab 12/04/2022 CT abdomen/pelvis 12/14/2022-further contraction of the treated segment 7 lesion, slight enlargement of segment 6 lesion, no new liver lesion, increased size of ileocolic nodes and an omental lesion, stable small retroperitoneal nodes Cycle 7 FOLFOX/bevacizumab 12/18/2022 Cycle 8 FOLFOX/bevacizumab 01/02/2023, oxaliplatin dose reduced secondary to prolonged nausea and neuropathy symptoms, Emend added Microcytic anemia secondary to #1-improved Colon polyps-sessile serrated adenoma of the descending colon, tubular adenomas with focal high-grade dysplasia of the transverse colon on colonoscopy 02/12/2020, tubular adenoma of the mid  ascending colon on the surgical specimen 04/12/2020 Family history of colon cancer Depression Anxiety/panic attacks Hypertension Hyperlipidemia Nausea secondary to Xeloda?-Resolved Gastroesophageal reflux disease-improved following discontinuation of Xeloda Anemia, microcytic; ferritin 6 01/24/2022; stool positive for blood x3 01/25/2022; 02/02/2022 CT abdomen/pelvis enterography-bowel loop demonstrating hypermetabolic activity continues to have a bandlike density along its margin possibly representing a diverticulum or adjacent nodal tissue, difficult to exclude small bowel tumor, 3 x 4 mm left omental nodule or lymph node, known right hepatic lobe tumor is occult on CT. Venofer weekly x3 beginning 03/03/2022 Negative capsule endoscopy 03/09/2022 Venofer 03/03/2022 for 3 weekly doses Venofer 05/03/2022, 05/19/2022 Venofer 07/28/2022, 08/04/2022 12.  Anterior left neck nodule 03/17/2022-cyst?,  Lymph node? 13.  Mucositis secondary to chemotherapy at office visit 10/03/2022-the 5-FU will be dose reduced with cycle 2 FOLFOX 14.  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  Oxaliplatin neuropathy-mild loss of vibratory sense 12/04/2022, 12/18/2022, 01/02/2023          Disposition: Anthony Calhoun appears unchanged.  He has completed 7 cycles of FOLFOX/bevacizumab.  He will complete cycle 8 today.  He has prolonged nausea following chemotherapy.  Emend will be added to the antiemetic premedication today.  He will let us know if he has significant nausea following this cycle.  We will add Decadron prophylaxis with monitoring of the blood sugar.  He will receive a dose of insulin in the chemotherapy room today.  I commended he follow a diabetic diet and check the blood sugar each morning for the next several days.  He will call if the blood sugar returns at greater than 350.  The CEA has been slightly higher over the past month.  The plan is to refer him for restaging CTs after cycle 10  FOLFOX/bevacizumab.  Thornton Papas, MD  01/02/2023  9:22 AM

## 2023-01-03 ENCOUNTER — Other Ambulatory Visit: Payer: Self-pay

## 2023-01-03 ENCOUNTER — Other Ambulatory Visit: Payer: Self-pay | Admitting: Adult Health

## 2023-01-03 ENCOUNTER — Telehealth: Payer: Self-pay | Admitting: *Deleted

## 2023-01-03 ENCOUNTER — Encounter: Payer: Self-pay | Admitting: *Deleted

## 2023-01-03 DIAGNOSIS — F411 Generalized anxiety disorder: Secondary | ICD-10-CM

## 2023-01-03 DIAGNOSIS — F410 Panic disorder [episodic paroxysmal anxiety] without agoraphobia: Secondary | ICD-10-CM

## 2023-01-03 NOTE — Progress Notes (Deleted)
Received fax from [facility] that patient died on 01/17/23 at 6:58 PM. MD made aware.

## 2023-01-03 NOTE — Telephone Encounter (Signed)
Faxed recent office note, labs and med list to Dr. Azucena Cecil [phone] requesting appointment asap to manage his diabetes. Followed up with phone call and spoke w/Donna and she will relay the request to provider.

## 2023-01-04 ENCOUNTER — Encounter: Payer: Self-pay | Admitting: *Deleted

## 2023-01-04 ENCOUNTER — Inpatient Hospital Stay: Payer: Medicaid Other

## 2023-01-04 ENCOUNTER — Encounter: Payer: Self-pay | Admitting: Oncology

## 2023-01-04 VITALS — BP 129/78 | HR 85 | Temp 98.6°F | Resp 18

## 2023-01-04 DIAGNOSIS — Z5111 Encounter for antineoplastic chemotherapy: Secondary | ICD-10-CM | POA: Diagnosis not present

## 2023-01-04 DIAGNOSIS — C189 Malignant neoplasm of colon, unspecified: Secondary | ICD-10-CM

## 2023-01-04 MED ORDER — SODIUM CHLORIDE 0.9% FLUSH
10.0000 mL | INTRAVENOUS | Status: DC | PRN
  Administered 2023-01-04: 10 mL

## 2023-01-04 MED ORDER — HEPARIN SOD (PORK) LOCK FLUSH 100 UNIT/ML IV SOLN
500.0000 [IU] | Freq: Once | INTRAVENOUS | Status: AC | PRN
  Administered 2023-01-04: 500 [IU]

## 2023-01-04 NOTE — Patient Instructions (Signed)

## 2023-01-05 NOTE — Telephone Encounter (Signed)
MyChart message sent to schedule appt. 

## 2023-01-08 ENCOUNTER — Encounter: Payer: Self-pay | Admitting: Nurse Practitioner

## 2023-01-08 ENCOUNTER — Encounter: Payer: Self-pay | Admitting: Oncology

## 2023-01-09 NOTE — Telephone Encounter (Signed)
Please call to schedule an appt. Sent MyChart message but he has not responded to it.

## 2023-01-09 NOTE — Telephone Encounter (Signed)
Lvm for patient to call and schedule 

## 2023-01-10 ENCOUNTER — Encounter: Payer: Self-pay | Admitting: Oncology

## 2023-01-10 ENCOUNTER — Encounter: Payer: Self-pay | Admitting: Nurse Practitioner

## 2023-01-11 ENCOUNTER — Other Ambulatory Visit: Payer: Self-pay | Admitting: Adult Health

## 2023-01-11 ENCOUNTER — Other Ambulatory Visit: Payer: Self-pay | Admitting: Oncology

## 2023-01-11 ENCOUNTER — Telehealth: Payer: Self-pay | Admitting: Genetic Counselor

## 2023-01-11 ENCOUNTER — Encounter: Payer: Self-pay | Admitting: Genetic Counselor

## 2023-01-11 DIAGNOSIS — F411 Generalized anxiety disorder: Secondary | ICD-10-CM

## 2023-01-11 DIAGNOSIS — F410 Panic disorder [episodic paroxysmal anxiety] without agoraphobia: Secondary | ICD-10-CM

## 2023-01-11 NOTE — Telephone Encounter (Signed)
Patient mother recently dx with rectal cancer with loss of MLH1/PMS2.  Discussed high likelihood of Lynch syndrome in his mother.  Recommended other maternal relatives be tested for Lynch syndrome.  Mr. Konopa said his mother has a half sister that is still living but the rest of the family members are deceased.  Knows and other relatives are welcome to call my direct number with questions/concerns.

## 2023-01-12 ENCOUNTER — Other Ambulatory Visit: Payer: Self-pay | Admitting: Adult Health

## 2023-01-12 ENCOUNTER — Telehealth: Payer: Self-pay | Admitting: Adult Health

## 2023-01-12 DIAGNOSIS — F411 Generalized anxiety disorder: Secondary | ICD-10-CM

## 2023-01-12 DIAGNOSIS — F410 Panic disorder [episodic paroxysmal anxiety] without agoraphobia: Secondary | ICD-10-CM

## 2023-01-12 MED ORDER — ALPRAZOLAM 1 MG PO TABS
ORAL_TABLET | ORAL | 2 refills | Status: DC
Start: 2023-01-12 — End: 2023-02-22

## 2023-01-12 NOTE — Telephone Encounter (Signed)
Pt calleda t 11:10 requesting refill of Alprazolam.  He said he is down to 4 pills and he takes 3 a day.  Pls send to  [id number] G[place] Otto, [place] - [address] Anthony Calhoun Anthony Calhoun Phone: [phone]  Fax: [phone]   Next appt 5/30

## 2023-01-12 NOTE — Telephone Encounter (Signed)
Script sent  

## 2023-01-16 ENCOUNTER — Inpatient Hospital Stay: Payer: Medicaid Other

## 2023-01-16 ENCOUNTER — Encounter: Payer: Self-pay | Admitting: *Deleted

## 2023-01-16 ENCOUNTER — Inpatient Hospital Stay: Payer: Medicaid Other | Admitting: Oncology

## 2023-01-16 VITALS — BP 123/69 | HR 74 | Resp 18

## 2023-01-16 DIAGNOSIS — C189 Malignant neoplasm of colon, unspecified: Secondary | ICD-10-CM

## 2023-01-16 DIAGNOSIS — Z5111 Encounter for antineoplastic chemotherapy: Secondary | ICD-10-CM | POA: Diagnosis not present

## 2023-01-16 DIAGNOSIS — C787 Secondary malignant neoplasm of liver and intrahepatic bile duct: Secondary | ICD-10-CM | POA: Diagnosis not present

## 2023-01-16 LAB — CMP (CANCER CENTER ONLY)
ALT: 56 U/L — ABNORMAL HIGH (ref 0–44)
AST: 53 U/L — ABNORMAL HIGH (ref 15–41)
Albumin: 3.6 g/dL (ref 3.5–5.0)
Alkaline Phosphatase: 212 U/L — ABNORMAL HIGH (ref 38–126)
Anion gap: 9 (ref 5–15)
BUN: 12 mg/dL (ref 6–20)
CO2: 25 mmol/L (ref 22–32)
Calcium: 8.7 mg/dL — ABNORMAL LOW (ref 8.9–10.3)
Chloride: 102 mmol/L (ref 98–111)
Creatinine: 0.74 mg/dL (ref 0.61–1.24)
GFR, Estimated: >60 mL/min (ref >60)
Glucose, Bld: 318 mg/dL — ABNORMAL HIGH (ref 70–99)
Potassium: 3.8 mmol/L (ref 3.5–5.1)
Sodium: 136 mmol/L (ref 135–145)
Total Bilirubin: 1.3 mg/dL — ABNORMAL HIGH (ref 0.3–1.2)
Total Protein: 6.9 g/dL (ref 6.5–8.1)

## 2023-01-16 LAB — CBC WITH DIFFERENTIAL (CANCER CENTER ONLY)
Abs Immature Granulocytes: 0 10*3/uL (ref 0.00–0.07)
Basophils Absolute: 0 10*3/uL (ref 0.0–0.1)
Basophils Relative: 1 %
Eosinophils Absolute: 0.1 10*3/uL (ref 0.0–0.5)
Eosinophils Relative: 3 %
HCT: 36.3 % — ABNORMAL LOW (ref 39.0–52.0)
Hemoglobin: 11.3 g/dL — ABNORMAL LOW (ref 13.0–17.0)
Immature Granulocytes: 0 %
Lymphocytes Relative: 25 %
Lymphs Abs: 0.7 10*3/uL (ref 0.7–4.0)
MCH: 25.8 pg — ABNORMAL LOW (ref 26.0–34.0)
MCHC: 31.1 g/dL (ref 30.0–36.0)
MCV: 82.9 fL (ref 80.0–100.0)
Monocytes Absolute: 0.4 10*3/uL (ref 0.1–1.0)
Monocytes Relative: 15 %
Neutro Abs: 1.6 10*3/uL — ABNORMAL LOW (ref 1.7–7.7)
Neutrophils Relative %: 56 %
Platelet Count: 123 10*3/uL — ABNORMAL LOW (ref 150–400)
RBC: 4.38 MIL/uL (ref 4.22–5.81)
RDW: 18.1 % — ABNORMAL HIGH (ref 11.5–15.5)
WBC Count: 2.9 10*3/uL — ABNORMAL LOW (ref 4.0–10.5)
nRBC: 0 % (ref 0.0–0.2)

## 2023-01-16 LAB — CEA (ACCESS): CEA (CHCC): 50.14 ng/mL — ABNORMAL HIGH (ref 0.00–5.00)

## 2023-01-16 LAB — TOTAL PROTEIN, URINE DIPSTICK: Protein, ur: NEGATIVE mg/dL

## 2023-01-16 MED ORDER — SODIUM CHLORIDE 0.9 % IV SOLN
5.0000 mg/kg | Freq: Once | INTRAVENOUS | Status: AC
  Administered 2023-01-16: 500 mg via INTRAVENOUS
  Filled 2023-01-16: qty 20

## 2023-01-16 MED ORDER — SODIUM CHLORIDE 0.9 % IV SOLN
1600.0000 mg/m2 | INTRAVENOUS | Status: DC
  Administered 2023-01-16: 3500 mg via INTRAVENOUS
  Filled 2023-01-16: qty 70

## 2023-01-16 MED ORDER — SODIUM CHLORIDE 0.9 % IV SOLN: Freq: Once | INTRAVENOUS | Status: AC

## 2023-01-16 MED ORDER — LEUCOVORIN CALCIUM INJECTION 350 MG
200.0000 mg/m2 | Freq: Once | INTRAVENOUS | Status: AC
  Administered 2023-01-16: 448 mg via INTRAVENOUS
  Filled 2023-01-16: qty 22.4

## 2023-01-16 MED ORDER — FLUOROURACIL CHEMO INJECTION 500 MG/10ML
200.0000 mg/m2 | Freq: Once | INTRAVENOUS | Status: AC
  Administered 2023-01-16: 450 mg via INTRAVENOUS
  Filled 2023-01-16: qty 9

## 2023-01-16 NOTE — Progress Notes (Signed)
[facility] OFFICE PROGRESS NOTE   Diagnosis: Colon cancer  INTERVAL HISTORY:   Anthony Calhoun completed another cycle of FOLFOX on 01/02/2023.  No nausea/vomiting, mouth sores, or diarrhea.  He reports loss of taste.  He has developed mild numbness of the left hand.  He had a tick bite at the right thigh approximately 1 week ago.  He "cut the tick out ".  He had less nausea following the last cycle of chemotherapy.  Objective:  Vital signs in last 24 hours:  Blood pressure 122/68, pulse 92, temperature 98.2 F (36.8 C), temperature source Oral, resp. rate 20, weight 238 lb (108 kg), SpO2 98 %.    HEENT: No thrush or ulcers Resp: Clear bilaterally Cardio: Regular rate and rhythm GI: No hepatosplenomegaly Vascular: No leg edema Neuro: Mild to moderate loss of vibratory sense at the fingertips bilaterally Skin: Eschar at the right upper thigh without evidence of infection  Portacath/PICC-without erythema  Lab Results:  Lab Results  Component Value Date   WBC 2.9 (L) 01/16/2023   HGB 11.3 (L) 01/16/2023   HCT 36.3 (L) 01/16/2023   MCV 82.9 01/16/2023   PLT 123 (L) 01/16/2023   NEUTROABS 1.6 (L) 01/16/2023    CMP  Lab Results  Component Value Date   NA 136 01/16/2023   K 3.8 01/16/2023   CL 102 01/16/2023   CO2 25 01/16/2023   GLUCOSE 318 (H) 01/16/2023   BUN 12 01/16/2023   CREATININE 0.74 01/16/2023   CALCIUM 8.7 (L) 01/16/2023   PROT 6.9 01/16/2023   ALBUMIN 3.6 01/16/2023   AST 53 (H) 01/16/2023   ALT 56 (H) 01/16/2023   ALKPHOS 212 (H) 01/16/2023   BILITOT 1.3 (H) 01/16/2023   GFRNONAA >60 01/16/2023   GFRAA >60 05/10/2020    Lab Results  Component Value Date   CEA1 1.08 03/07/2021   CEA 50.14 (H) 01/16/2023     Medications: I have reviewed the patient's current medications.   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 secondary to nausea (patient discontinued Xeloda on day 11 due to colonoscopy) Colonoscopy 07/09/2020-nodular mucosa at the colonic anastomosis, biopsied; patent end-to-side ileocolonic anastomosis characterized by healthy-appearing mucosa and visible sutures; nonbleeding internal hemorrhoids, biopsy with benign ulcerated anastomotic mucosa with granulation tissue Cycle 4 adjuvant Xeloda 07/19/2020, 2000 mg every morning and 1500 mg every afternoon Cycle 5 adjuvant Xeloda 08/09/2020 Cycle 6 adjuvant Xeloda 08/31/2019 Cycle 7 adjuvant Xeloda 09/20/2020 Cycle 8 adjuvant Xeloda 10/11/2020 CTs 03/07/2021-no evidence of recurrent disease, hepatic steatosis, slight wall thickening at the junction of the descending and horizontal duodenum Mild elevation of CEA 2023 12/01/2021-Guardant Reveal-ct DNA detected PET 12/30/2021-hypermetabolic right liver lesion, hypermetabolic area in a loop of mid small bowel with an SUV of 12.5, review of PET images at GI tumor conference 01/11/2022-small bowel uptake felt to be a benign finding MRI liver 01/10/2022-solitary 1.2 cm right liver lesion between segments 7 and 8 compatible with metastasis, subtle changes suggesting cirrhosis, hepatic steatosis CT enteroscopy 02/02/2022-small bowel loop with hypermetabolic activity on PET continues to have a bandlike density along the margin  felt to represent a diverticulum or adjacent nodal tissue.  Small bowel tumor not excluded.  3-4 mm left omental nodule 02/08/2022-biopsy and ablation of solitary liver lesion, adenocarcinoma consistent with metastatic colorectal  adenocarcinoma CTs 03/31/2022-unchanged circumstantial soft tissue thickening surrounding a loop of mid to distal small bowel with an adjacent nodular focus measuring 1.2 cm.,  Ablation cavity in segment 7, no other evidence of metastatic disease Diagnostic laparoscopy 05/26/2022-mid small bowel mass-resected, well to moderately differentiated adenocarcinoma, 0/2 lymph nodes, morphology consistent with a colon primary, tumor involved full-thickness including serosa with perineural and large vessel involvement; foundation 1-microsatellite stable, tumor mutation burden 2, K-ras Q61H CT abdomen/pelvis 07/10/2022-the right liver ablation defect is smaller, new subtle right liver lesion measuring 13 x 9 mm, enlarged lymph node in the right upper quadrant mesentery adjacent to surgical anastomosis PET 07/26/2022-new segment 6 liver lesion, enlarging hypermetabolic left omental lesion, there is another mildly hypermetabolic peritoneal implant, 7 mm omental nodule at the midline without hypermetabolism Cycle 1 FOLFOX 09/25/2022 Cycle 2 FOLFOX/bevacizumab 10/09/2022, 5-FU dose reduced secondary to mucositis Cycle 3 FOLFOX/bevacizumab 10/23/2022 Cycle 4 FOLFOX/bevacizumab 11/06/2022 Cycle 5 FOLFOX/bevacizumab 11/20/2022 Cycle 6 FOLFOX/bevacizumab 12/04/2022 CT abdomen/pelvis 12/14/2022-further contraction of the treated segment 7 lesion, slight enlargement of segment 6 lesion, no new liver lesion, increased size of ileocolic nodes and an omental lesion, stable small retroperitoneal nodes Cycle 7 FOLFOX/bevacizumab 12/18/2022 Cycle 8 FOLFOX/bevacizumab 01/02/2023, oxaliplatin dose reduced secondary to prolonged nausea and neuropathy symptoms, Emend added Cycle 9 FOLFOX/bevacizumab 01/16/2023, oxaliplatin discontinued secondary to neuropathy Microcytic anemia secondary to #1-improved Colon polyps-sessile serrated adenoma of the descending colon, tubular adenomas with focal high-grade dysplasia of the transverse colon on  colonoscopy 02/12/2020, tubular adenoma of the mid ascending colon on the surgical specimen 04/12/2020 Family history of colon cancer Depression Anxiety/panic attacks Hypertension Hyperlipidemia Nausea secondary to Xeloda?-Resolved Gastroesophageal reflux disease-improved following discontinuation of Xeloda Anemia, microcytic; ferritin 6 01/24/2022; stool positive for blood x3 01/25/2022; 02/02/2022 CT abdomen/pelvis enterography-bowel loop demonstrating hypermetabolic activity continues to have a bandlike density along its margin possibly representing a diverticulum or adjacent nodal tissue, difficult to exclude small bowel tumor, 3 x 4 mm left omental nodule or lymph node, known right hepatic lobe tumor is occult on CT. Venofer weekly x3 beginning 03/03/2022 Negative capsule endoscopy 03/09/2022 Venofer 03/03/2022 for 3 weekly doses Venofer 05/03/2022, 05/19/2022 Venofer 07/28/2022, 08/04/2022 12.  Anterior left neck nodule 03/17/2022-cyst?,  Lymph node? 13.  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  Oxaliplatin neuropathy-mild loss of vibratory sense 12/04/2022, 12/18/2022, 01/02/2023          Disposition: Anthony Calhoun appears unchanged.  He has completed 9 cycles of FOLFOX/bevacizumab.  He is developing oxaliplatin neuropathy.  We decided to hold oxaliplatin with chemotherapy today.  He will complete a cycle of 5-FU and bevacizumab.  He will return for an office visit and chemotherapy in 2 weeks.  He will be scheduled undergo restaging CTs after cycle 10 chemotherapy.  Thornton Papas, MD  01/16/2023  10:43 AM

## 2023-01-16 NOTE — Progress Notes (Signed)
Patient seen by Dr. Truett Perna today  Vitals are all within treatment parameters.  Labs reviewed by Dr. Truett Perna and are not all within treatment parameters. OK to treat w/ANC 1.6  Per physician team, patient is ready for treatment. Please note that modifications are being made to the treatment plan including Discontinue oxaliplatin and premeds.

## 2023-01-16 NOTE — Patient Instructions (Addendum)
[facility]   Discharge Instructions: Thank you for choosing [facility] to provide your oncology and hematology care.   If you have a lab appointment with the Cancer Center, please go directly to the Cancer Center and check in at the registration area.   Wear comfortable clothing and clothing appropriate for easy access to any Portacath or PICC line.   We strive to give you quality time with your provider. You may need to reschedule your appointment if you arrive late (15 or more minutes).  Arriving late affects you and other patients whose appointments are after yours.  Also, if you miss three or more appointments without notifying the office, you may be dismissed from the clinic at the provider's discretion.      For prescription refill requests, have your pharmacy contact our office and allow 72 hours for refills to be completed.    Today you received the following chemotherapy and/or immunotherapy agents Bevacizumab-adcd (VEGZELMA), Leucovorin & Flourouracil (ADRUCIL).      To help prevent nausea and vomiting after your treatment, we encourage you to take your nausea medication as directed.  BELOW ARE SYMPTOMS THAT SHOULD BE REPORTED IMMEDIATELY: *FEVER GREATER THAN 100.4 F (38 C) OR HIGHER *CHILLS OR SWEATING *NAUSEA AND VOMITING THAT IS NOT CONTROLLED WITH YOUR NAUSEA MEDICATION *UNUSUAL SHORTNESS OF BREATH *UNUSUAL BRUISING OR BLEEDING *URINARY PROBLEMS (pain or burning when urinating, or frequent urination) *BOWEL PROBLEMS (unusual diarrhea, constipation, pain near the anus) TENDERNESS IN MOUTH AND THROAT WITH OR WITHOUT PRESENCE OF ULCERS (sore throat, sores in mouth, or a toothache) UNUSUAL RASH, SWELLING OR PAIN  UNUSUAL VAGINAL DISCHARGE OR ITCHING   Items with * indicate a potential emergency and should be followed up as soon as possible or go to the Emergency Department if any problems should occur.  Please show the  CHEMOTHERAPY ALERT CARD or IMMUNOTHERAPY ALERT CARD at check-in to the Emergency Department and triage nurse.  Should you have questions after your visit or need to cancel or reschedule your appointment, please contact [facility]  Dept: [phone]  and follow the prompts.  Office hours are 8:00 a.m. to 4:30 p.m. Monday - Friday. Please note that voicemails left after 4:00 p.m. may not be returned until the following business day.  We are closed weekends and major holidays. You have access to a nurse at all times for urgent questions. Please call the main number to the clinic Dept: [phone] and follow the prompts.   For any non-urgent questions, you may also contact your provider using MyChart. We now offer e-Visits for anyone 54 and older to request care online for non-urgent symptoms. For details visit [email or web address].   Also download the MyChart app! Go to the app store, search "MyChart", open the app, select [place], and log in with your MyChart username and password.  Bevacizumab Injection What is this medication? BEVACIZUMAB (be va SIZ yoo mab) treats some types of cancer. It works by blocking a protein that causes cancer cells to grow and multiply. This helps to slow or stop the spread of cancer cells. It is a monoclonal antibody. This medicine may be used for other purposes; ask your health care provider or pharmacist if you have questions. COMMON BRAND NAME(S): Alymsys, Avastin, MVASI, Omer Jack What should I tell my care team before I take this medication? They need to know if you have any of these conditions: Blood clots Coughing up blood Having  or recent surgery Heart failure High blood pressure History of a connection between 2 or more body parts that do not usually connect (fistula) History of a tear in your stomach or intestines Protein in your urine An unusual or allergic reaction to bevacizumab, other medications, foods,  dyes, or preservatives Pregnant or trying to get pregnant Breast-feeding How should I use this medication? This medication is injected into a vein. It is given by your care team in a hospital or clinic setting. Talk to your care team the use of this medication in children. Special care may be needed. Overdosage: If you think you have taken too much of this medicine contact a poison control center or emergency room at once. NOTE: This medicine is only for you. Do not share this medicine with others. What if I miss a dose? Keep appointments for follow-up doses. It is important not to miss your dose. Call your care team if you are unable to keep an appointment. What may interact with this medication? Interactions are not expected. This list may not describe all possible interactions. Give your health care provider a list of all the medicines, herbs, non-prescription drugs, or dietary supplements you use. Also tell them if you smoke, drink alcohol, or use illegal drugs. Some items may interact with your medicine. What should I watch for while using this medication? Your condition will be monitored carefully while you are receiving this medication. You may need blood work while taking this medication. This medication may make you feel generally unwell. This is not uncommon as chemotherapy can affect healthy cells as well as cancer cells. Report any side effects. Continue your course of treatment even though you feel ill unless your care team tells you to stop. This medication may increase your risk to bruise or bleed. Call your care team if you notice any unusual bleeding. Before having surgery, talk to your care team to make sure it is ok. This medication can increase the risk of poor healing of your surgical site or wound. You will need to stop this medication for 28 days before surgery. After surgery, wait at least 28 days before restarting this medication. Make sure the surgical site or wound is  healed enough before restarting this medication. Talk to your care team if questions. Talk to your care team if you may be pregnant. Serious birth defects can occur if you take this medication during pregnancy and for 6 months after the last dose. Contraception is recommended while taking this medication and for 6 months after the last dose. Your care team can help you find the option that works for you. Do not breastfeed while taking this medication and for 6 months after the last dose. This medication can cause infertility. Talk to your care team if you are concerned about your fertility. What side effects may I notice from receiving this medication? Side effects that you should report to your care team as soon as possible: Allergic reactions--skin rash, itching, hives, swelling of the face, lips, tongue, or throat Bleeding--bloody or black, tar-like stools, vomiting blood or brown material that looks like coffee grounds, red or dark brown urine, small red or purple spots on skin, unusual bruising or bleeding Blood clot--pain, swelling, or warmth in the leg, shortness of breath, chest pain Heart attack--pain or tightness in the chest, shoulders, arms, or jaw, nausea, shortness of breath, cold or clammy skin, feeling faint or lightheaded Heart failure--shortness of breath, swelling of the ankles, feet, or hands, sudden weight  gain, unusual weakness or fatigue Increase in blood pressure Infection--fever, chills, cough, sore throat, wounds that don't heal, pain or trouble when passing urine, general feeling of discomfort or being unwell Infusion reactions--chest pain, shortness of breath or trouble breathing, feeling faint or lightheaded Kidney injury--decrease in the amount of urine, swelling of the ankles, hands, or feet Stomach pain that is severe, does not go away, or gets worse Stroke--sudden numbness or weakness of the face, arm, or leg, trouble speaking, confusion, trouble walking, loss of  balance or coordination, dizziness, severe headache, change in vision Sudden and severe headache, confusion, change in vision, seizures, which may be signs of posterior reversible encephalopathy syndrome (PRES) Side effects that usually do not require medical attention (report to your care team if they continue or are bothersome): Back pain Change in taste Diarrhea Dry skin Increased tears Nosebleed This list may not describe all possible side effects. Call your doctor for medical advice about side effects. You may report side effects to FDA at 1-800-FDA-1088. Where should I keep my medication? This medication is given in a hospital or clinic. It will not be stored at home. NOTE: This sheet is a summary. It may not cover all possible information. If you have questions about this medicine, talk to your doctor, pharmacist, or health care provider.  2023 Elsevier/Gold Standard (2021-12-16 00:00:00)  Leucovorin Injection What is this medication? LEUCOVORIN (loo koe VOR in) prevents side effects from certain medications, such as methotrexate. It works by increasing folate levels. This helps protect healthy cells in your body. It may also be used to treat anemia caused by low levels of folate. It can also be used with fluorouracil, a type of chemotherapy, to treat colorectal cancer. It works by increasing the effects of fluorouracil in the body. This medicine may be used for other purposes; ask your health care provider or pharmacist if you have questions. What should I tell my care team before I take this medication? They need to know if you have any of these conditions: Anemia from low levels of vitamin B12 in the blood An unusual or allergic reaction to leucovorin, folic acid, other medications, foods, dyes, or preservatives Pregnant or trying to get pregnant Breastfeeding How should I use this medication? This medication is injected into a vein or a muscle. It is given by your care team in a  hospital or clinic setting. Talk to your care team about the use of this medication in children. Special care may be needed. Overdosage: If you think you have taken too much of this medicine contact a poison control center or emergency room at once. NOTE: This medicine is only for you. Do not share this medicine with others. What if I miss a dose? Keep appointments for follow-up doses. It is important not to miss your dose. Call your care team if you are unable to keep an appointment. What may interact with this medication? Capecitabine Fluorouracil Phenobarbital Phenytoin Primidone Trimethoprim;sulfamethoxazole This list may not describe all possible interactions. Give your health care provider a list of all the medicines, herbs, non-prescription drugs, or dietary supplements you use. Also tell them if you smoke, drink alcohol, or use illegal drugs. Some items may interact with your medicine. What should I watch for while using this medication? Your condition will be monitored carefully while you are receiving this medication. This medication may increase the side effects of 5-fluorouracil. Tell your care team if you have diarrhea or mouth sores that do not get better or  that get worse. What side effects may I notice from receiving this medication? Side effects that you should report to your care team as soon as possible: Allergic reactions--skin rash, itching, hives, swelling of the face, lips, tongue, or throat This list may not describe all possible side effects. Call your doctor for medical advice about side effects. You may report side effects to FDA at 1-800-FDA-1088. Where should I keep my medication? This medication is given in a hospital or clinic. It will not be stored at home. NOTE: This sheet is a summary. It may not cover all possible information. If you have questions about this medicine, talk to your doctor, pharmacist, or health care provider.  2023 Elsevier/Gold Standard  (2021-12-23 00:00:00)  Fluorouracil Injection What is this medication? FLUOROURACIL (flure oh YOOR a sil) treats some types of cancer. It works by slowing down the growth of cancer cells. This medicine may be used for other purposes; ask your health care provider or pharmacist if you have questions. COMMON BRAND NAME(S): Adrucil What should I tell my care team before I take this medication? They need to know if you have any of these conditions: Blood disorders Dihydropyrimidine dehydrogenase (DPD) deficiency Infection, such as chickenpox, cold sores, herpes Kidney disease Liver disease Poor nutrition Recent or ongoing radiation therapy An unusual or allergic reaction to fluorouracil, other medications, foods, dyes, or preservatives If you or your partner are pregnant or trying to get pregnant Breast-feeding How should I use this medication? This medication is injected into a vein. It is administered by your care team in a hospital or clinic setting. Talk to your care team about the use of this medication in children. Special care may be needed. Overdosage: If you think you have taken too much of this medicine contact a poison control center or emergency room at once. NOTE: This medicine is only for you. Do not share this medicine with others. What if I miss a dose? Keep appointments for follow-up doses. It is important not to miss your dose. Call your care team if you are unable to keep an appointment. What may interact with this medication? Do not take this medication with any of the following: Live virus vaccines This medication may also interact with the following: Medications that treat or prevent blood clots, such as warfarin, enoxaparin, dalteparin This list may not describe all possible interactions. Give your health care provider a list of all the medicines, herbs, non-prescription drugs, or dietary supplements you use. Also tell them if you smoke, drink alcohol, or use illegal  drugs. Some items may interact with your medicine. What should I watch for while using this medication? Your condition will be monitored carefully while you are receiving this medication. This medication may make you feel generally unwell. This is not uncommon as chemotherapy can affect healthy cells as well as cancer cells. Report any side effects. Continue your course of treatment even though you feel ill unless your care team tells you to stop. In some cases, you may be given additional medications to help with side effects. Follow all directions for their use. This medication may increase your risk of getting an infection. Call your care team for advice if you get a fever, chills, sore throat, or other symptoms of a cold or flu. Do not treat yourself. Try to avoid being around people who are sick. This medication may increase your risk to bruise or bleed. Call your care team if you notice any unusual bleeding. Be careful brushing  or flossing your teeth or using a toothpick because you may get an infection or bleed more easily. If you have any dental work done, tell your dentist you are receiving this medication. Avoid taking medications that contain aspirin, acetaminophen, ibuprofen, naproxen, or ketoprofen unless instructed by your care team. These medications may hide a fever. Do not treat diarrhea with over the counter products. Contact your care team if you have diarrhea that lasts more than 2 days or if it is severe and watery. This medication can make you more sensitive to the sun. Keep out of the sun. If you cannot avoid being in the sun, wear protective clothing and sunscreen. Do not use sun lamps, tanning beds, or tanning booths. Talk to your care team if you or your partner wish to become pregnant or think you might be pregnant. This medication can cause serious birth defects if taken during pregnancy and for 3 months after the last dose. A reliable form of contraception is recommended while  taking this medication and for 3 months after the last dose. Talk to your care team about effective forms of contraception. Do not father a child while taking this medication and for 3 months after the last dose. Use a condom while having sex during this time period. Do not breastfeed while taking this medication. This medication may cause infertility. Talk to your care team if you are concerned about your fertility. What side effects may I notice from receiving this medication? Side effects that you should report to your care team as soon as possible: Allergic reactions--skin rash, itching, hives, swelling of the face, lips, tongue, or throat Heart attack--pain or tightness in the chest, shoulders, arms, or jaw, nausea, shortness of breath, cold or clammy skin, feeling faint or lightheaded Heart failure--shortness of breath, swelling of the ankles, feet, or hands, sudden weight gain, unusual weakness or fatigue Heart rhythm changes--fast or irregular heartbeat, dizziness, feeling faint or lightheaded, chest pain, trouble breathing High ammonia level--unusual weakness or fatigue, confusion, loss of appetite, nausea, vomiting, seizures Infection--fever, chills, cough, sore throat, wounds that don't heal, pain or trouble when passing urine, general feeling of discomfort or being unwell Low red blood cell level--unusual weakness or fatigue, dizziness, headache, trouble breathing Pain, tingling, or numbness in the hands or feet, muscle weakness, change in vision, confusion or trouble speaking, loss of balance or coordination, trouble walking, seizures Redness, swelling, and blistering of the skin over hands and feet Severe or prolonged diarrhea Unusual bruising or bleeding Side effects that usually do not require medical attention (report to your care team if they continue or are bothersome): Dry skin Headache Increased tears Nausea Pain, redness, or swelling with sores inside the mouth or  throat Sensitivity to light Vomiting This list may not describe all possible side effects. Call your doctor for medical advice about side effects. You may report side effects to FDA at 1-800-FDA-1088. Where should I keep my medication? This medication is given in a hospital or clinic. It will not be stored at home. NOTE: This sheet is a summary. It may not cover all possible information. If you have questions about this medicine, talk to your doctor, pharmacist, or health care provider.  2023 Elsevier/Gold Standard (2021-12-13 00:00:00)   The chemotherapy medication bag should finish at 46 hours, 96 hours, or 7 days. For example, if your pump is scheduled for 46 hours and it was put on at 4:00 p.m., it should finish at 2:00 p.m. the day it is scheduled  to come off regardless of your appointment time.     Estimated time to finish at 11:00 a.m. on Thursday 01/18/2023.   If the display on your pump reads "Low Volume" and it is beeping, take the batteries out of the pump and come to the cancer center for it to be taken off.   If the pump alarms go off prior to the pump reading "Low Volume" then call [phone] and someone can assist you.  If the plunger comes out and the chemotherapy medication is leaking out, please use your home chemo spill kit to clean up the spill. Do NOT use paper towels or other household products.  If you have problems or questions regarding your pump, please call either [phone] (24 hours a day) or the cancer center Monday-Friday 8:00 a.m.- 4:30 p.m. at the clinic number and we will assist you. If you are unable to get assistance, then go to the nearest Emergency Department and ask the staff to contact the IV team for assistance.

## 2023-01-17 ENCOUNTER — Encounter: Payer: Self-pay | Admitting: Oncology

## 2023-01-18 ENCOUNTER — Inpatient Hospital Stay: Payer: Medicaid Other

## 2023-01-18 ENCOUNTER — Encounter: Payer: Self-pay | Admitting: Oncology

## 2023-01-18 VITALS — BP 128/70 | HR 87 | Temp 98.3°F | Resp 18

## 2023-01-18 DIAGNOSIS — Z5111 Encounter for antineoplastic chemotherapy: Secondary | ICD-10-CM | POA: Diagnosis not present

## 2023-01-18 DIAGNOSIS — C787 Secondary malignant neoplasm of liver and intrahepatic bile duct: Secondary | ICD-10-CM

## 2023-01-18 MED ORDER — SODIUM CHLORIDE 0.9% FLUSH
10.0000 mL | INTRAVENOUS | Status: DC | PRN
  Administered 2023-01-18: 10 mL

## 2023-01-18 MED ORDER — HEPARIN SOD (PORK) LOCK FLUSH 100 UNIT/ML IV SOLN
500.0000 [IU] | Freq: Once | INTRAVENOUS | Status: AC | PRN
  Administered 2023-01-18: 500 [IU]

## 2023-01-18 NOTE — Patient Instructions (Signed)

## 2023-01-25 ENCOUNTER — Telehealth: Payer: Medicaid Other | Admitting: Adult Health

## 2023-01-26 ENCOUNTER — Other Ambulatory Visit: Payer: Self-pay

## 2023-01-28 ENCOUNTER — Other Ambulatory Visit: Payer: Self-pay | Admitting: Oncology

## 2023-01-28 DIAGNOSIS — C189 Malignant neoplasm of colon, unspecified: Secondary | ICD-10-CM

## 2023-01-29 ENCOUNTER — Inpatient Hospital Stay: Payer: Medicaid Other | Attending: Oncology

## 2023-01-29 ENCOUNTER — Encounter: Payer: Self-pay | Admitting: *Deleted

## 2023-01-29 ENCOUNTER — Inpatient Hospital Stay: Payer: Medicaid Other | Admitting: Oncology

## 2023-01-29 ENCOUNTER — Inpatient Hospital Stay: Payer: Medicaid Other

## 2023-01-29 VITALS — BP 130/70 | HR 85 | Temp 98.2°F | Resp 18 | Ht 66.0 in | Wt 237.5 lb

## 2023-01-29 DIAGNOSIS — Z79899 Other long term (current) drug therapy: Secondary | ICD-10-CM | POA: Diagnosis not present

## 2023-01-29 DIAGNOSIS — C787 Secondary malignant neoplasm of liver and intrahepatic bile duct: Secondary | ICD-10-CM

## 2023-01-29 DIAGNOSIS — C182 Malignant neoplasm of ascending colon: Secondary | ICD-10-CM | POA: Diagnosis present

## 2023-01-29 DIAGNOSIS — Z5111 Encounter for antineoplastic chemotherapy: Secondary | ICD-10-CM | POA: Insufficient documentation

## 2023-01-29 DIAGNOSIS — C189 Malignant neoplasm of colon, unspecified: Secondary | ICD-10-CM

## 2023-01-29 DIAGNOSIS — D509 Iron deficiency anemia, unspecified: Secondary | ICD-10-CM | POA: Diagnosis not present

## 2023-01-29 DIAGNOSIS — Z95828 Presence of other vascular implants and grafts: Secondary | ICD-10-CM | POA: Diagnosis not present

## 2023-01-29 LAB — CBC WITH DIFFERENTIAL (CANCER CENTER ONLY)
Abs Immature Granulocytes: 0.01 10*3/uL (ref 0.00–0.07)
Basophils Absolute: 0 10*3/uL (ref 0.0–0.1)
Basophils Relative: 1 %
Eosinophils Absolute: 0.1 10*3/uL (ref 0.0–0.5)
Eosinophils Relative: 3 %
HCT: 36.3 % — ABNORMAL LOW (ref 39.0–52.0)
Hemoglobin: 11.4 g/dL — ABNORMAL LOW (ref 13.0–17.0)
Immature Granulocytes: 0 %
Lymphocytes Relative: 28 %
Lymphs Abs: 0.9 10*3/uL (ref 0.7–4.0)
MCH: 25.7 pg — ABNORMAL LOW (ref 26.0–34.0)
MCHC: 31.4 g/dL (ref 30.0–36.0)
MCV: 81.8 fL (ref 80.0–100.0)
Monocytes Absolute: 0.4 10*3/uL (ref 0.1–1.0)
Monocytes Relative: 13 %
Neutro Abs: 1.8 10*3/uL (ref 1.7–7.7)
Neutrophils Relative %: 55 %
Platelet Count: 130 10*3/uL — ABNORMAL LOW (ref 150–400)
RBC: 4.44 MIL/uL (ref 4.22–5.81)
RDW: 17.9 % — ABNORMAL HIGH (ref 11.5–15.5)
WBC Count: 3.3 10*3/uL — ABNORMAL LOW (ref 4.0–10.5)
nRBC: 0 % (ref 0.0–0.2)

## 2023-01-29 LAB — CMP (CANCER CENTER ONLY)
ALT: 43 U/L (ref 0–44)
AST: 48 U/L — ABNORMAL HIGH (ref 15–41)
Albumin: 3.5 g/dL (ref 3.5–5.0)
Alkaline Phosphatase: 246 U/L — ABNORMAL HIGH (ref 38–126)
Anion gap: 6 (ref 5–15)
BUN: 8 mg/dL (ref 6–20)
CO2: 27 mmol/L (ref 22–32)
Calcium: 8.8 mg/dL — ABNORMAL LOW (ref 8.9–10.3)
Chloride: 105 mmol/L (ref 98–111)
Creatinine: 0.64 mg/dL (ref 0.61–1.24)
GFR, Estimated: >60 mL/min (ref >60)
Glucose, Bld: 208 mg/dL — ABNORMAL HIGH (ref 70–99)
Potassium: 3.6 mmol/L (ref 3.5–5.1)
Sodium: 138 mmol/L (ref 135–145)
Total Bilirubin: 1.1 mg/dL (ref 0.3–1.2)
Total Protein: 7 g/dL (ref 6.5–8.1)

## 2023-01-29 LAB — CEA (ACCESS): CEA (CHCC): 55.42 ng/mL — ABNORMAL HIGH (ref 0.00–5.00)

## 2023-01-29 LAB — TOTAL PROTEIN, URINE DIPSTICK: Protein, ur: NEGATIVE mg/dL

## 2023-01-29 MED ORDER — HEPARIN SOD (PORK) LOCK FLUSH 100 UNIT/ML IV SOLN
500.0000 [IU] | Freq: Once | INTRAVENOUS | Status: AC
  Administered 2023-01-29: 500 [IU] via INTRAVENOUS

## 2023-01-29 MED ORDER — MAGIC MOUTHWASH: 5.0000 mL | Freq: Four times a day (QID) | ORAL | 1 refills | Status: DC | PRN

## 2023-01-29 MED ORDER — SODIUM CHLORIDE 0.9% FLUSH
10.0000 mL | INTRAVENOUS | Status: DC | PRN
  Administered 2023-01-29: 10 mL via INTRAVENOUS

## 2023-01-29 NOTE — Progress Notes (Signed)
[facility] OFFICE PROGRESS NOTE   Diagnosis: Colon cancer  INTERVAL HISTORY:   Anthony Calhoun completed cycle of 5-FU and bevacizumab on 01/16/2023.  No nausea/vomiting, diarrhea, or mouth sores.  He continues to have altered taste.  He reports resolution of peripheral neuropathy symptoms.  Objective:  Vital signs in last 24 hours:  Blood pressure 130/70, pulse 85, temperature 98.2 F (36.8 C), temperature source Oral, resp. rate 18, height 5\' 6"  (1.676 m), weight 237 lb 8 oz (107.7 kg), SpO2 98 %.    HEENT: No thrush or ulcers Resp: Lungs clear bilaterally Cardio: Regular rate and rhythm GI: No hepatosplenomegaly, nontender, no mass Vascular: No leg edema    Portacath/PICC-without erythema  Lab Results:  Lab Results  Component Value Date   WBC 3.3 (L) 01/29/2023   HGB 11.4 (L) 01/29/2023   HCT 36.3 (L) 01/29/2023   MCV 81.8 01/29/2023   PLT 130 (L) 01/29/2023   NEUTROABS 1.8 01/29/2023    CMP  Lab Results  Component Value Date   NA 138 01/29/2023   K 3.6 01/29/2023   CL 105 01/29/2023   CO2 27 01/29/2023   GLUCOSE 208 (H) 01/29/2023   BUN 8 01/29/2023   CREATININE 0.64 01/29/2023   CALCIUM 8.8 (L) 01/29/2023   PROT 7.0 01/29/2023   ALBUMIN 3.5 01/29/2023   AST 48 (H) 01/29/2023   ALT 43 01/29/2023   ALKPHOS 246 (H) 01/29/2023   BILITOT 1.1 01/29/2023   GFRNONAA >60 01/29/2023   GFRAA >60 05/10/2020    Lab Results  Component Value Date   CEA1 1.08 03/07/2021   CEA 55.42 (H) 01/29/2023    Medications: I have reviewed the patient's current medications.   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 secondary to nausea (patient discontinued Xeloda on day 11 due to colonoscopy) Colonoscopy 07/09/2020-nodular mucosa at the colonic anastomosis, biopsied; patent end-to-side ileocolonic anastomosis characterized by healthy-appearing mucosa and visible sutures; nonbleeding internal hemorrhoids, biopsy with benign ulcerated anastomotic mucosa with granulation tissue Cycle 4 adjuvant Xeloda 07/19/2020, 2000 mg every morning and 1500 mg every afternoon Cycle 5 adjuvant Xeloda 08/09/2020 Cycle 6 adjuvant Xeloda 08/31/2019 Cycle 7 adjuvant Xeloda 09/20/2020 Cycle 8 adjuvant Xeloda 10/11/2020 CTs 03/07/2021-no evidence of recurrent disease, hepatic steatosis, slight wall thickening at the junction of the descending and horizontal duodenum Mild elevation of CEA 2023 12/01/2021-Guardant Reveal-ct DNA detected PET 12/30/2021-hypermetabolic right liver lesion, hypermetabolic area in a loop of mid small bowel with an SUV of 12.5, review of PET images at GI tumor conference 01/11/2022-small bowel uptake felt to be a benign finding MRI liver 01/10/2022-solitary 1.2 cm right liver lesion between segments 7 and 8 compatible with metastasis, subtle changes suggesting cirrhosis, hepatic steatosis CT enteroscopy 02/02/2022-small bowel loop with hypermetabolic activity on PET continues to have a bandlike density along the margin felt to represent a diverticulum or adjacent nodal tissue.  Small bowel tumor not excluded.  3-4 mm left omental nodule 02/08/2022-biopsy and ablation of solitary liver lesion, adenocarcinoma consistent with metastatic colorectal adenocarcinoma CTs 03/31/2022-unchanged circumstantial soft tissue thickening surrounding a loop of mid to  distal small bowel with an adjacent nodular focus measuring 1.2 cm.,  Ablation cavity in segment 7, no other evidence of  metastatic disease Diagnostic laparoscopy 05/26/2022-mid small bowel mass-resected, well to moderately differentiated adenocarcinoma, 0/2 lymph nodes, morphology consistent with a colon primary, tumor involved full-thickness including serosa with perineural and large vessel involvement; foundation 1-microsatellite stable, tumor mutation burden 2, K-ras Q61H CT abdomen/pelvis 07/10/2022-the right liver ablation defect is smaller, new subtle right liver lesion measuring 13 x 9 mm, enlarged lymph node in the right upper quadrant mesentery adjacent to surgical anastomosis PET 07/26/2022-new segment 6 liver lesion, enlarging hypermetabolic left omental lesion, there is another mildly hypermetabolic peritoneal implant, 7 mm omental nodule at the midline without hypermetabolism Cycle 1 FOLFOX 09/25/2022 Cycle 2 FOLFOX/bevacizumab 10/09/2022, 5-FU dose reduced secondary to mucositis Cycle 3 FOLFOX/bevacizumab 10/23/2022 Cycle 4 FOLFOX/bevacizumab 11/06/2022 Cycle 5 FOLFOX/bevacizumab 11/20/2022 Cycle 6 FOLFOX/bevacizumab 12/04/2022 CT abdomen/pelvis 12/14/2022-further contraction of the treated segment 7 lesion, slight enlargement of segment 6 lesion, no new liver lesion, increased size of ileocolic nodes and an omental lesion, stable small retroperitoneal nodes Cycle 7 FOLFOX/bevacizumab 12/18/2022 Cycle 8 FOLFOX/bevacizumab 01/02/2023, oxaliplatin dose reduced secondary to prolonged nausea and neuropathy symptoms, Emend added Cycle 9 FOLFOX/bevacizumab 01/16/2023, oxaliplatin discontinued secondary to neuropathy Microcytic anemia secondary to #1-improved Colon polyps-sessile serrated adenoma of the descending colon, tubular adenomas with focal high-grade dysplasia of the transverse colon on colonoscopy 02/12/2020, tubular adenoma of the mid ascending colon on the surgical specimen 04/12/2020 Family history of colon cancer Depression Anxiety/panic attacks Hypertension Hyperlipidemia Nausea secondary to  Xeloda?-Resolved Gastroesophageal reflux disease-improved following discontinuation of Xeloda Anemia, microcytic; ferritin 6 01/24/2022; stool positive for blood x3 01/25/2022; 02/02/2022 CT abdomen/pelvis enterography-bowel loop demonstrating hypermetabolic activity continues to have a bandlike density along its margin possibly representing a diverticulum or adjacent nodal tissue, difficult to exclude small bowel tumor, 3 x 4 mm left omental nodule or lymph node, known right hepatic lobe tumor is occult on CT. Venofer weekly x3 beginning 03/03/2022 Negative capsule endoscopy 03/09/2022 Venofer 03/03/2022 for 3 weekly doses Venofer 05/03/2022, 05/19/2022 Venofer 07/28/2022, 08/04/2022 12.  Anterior left neck nodule 03/17/2022-cyst?,  Lymph node? 13.  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  Oxaliplatin neuropathy-mild loss of vibratory sense 12/04/2022, 12/18/2022, 01/02/2023      Disposition: Anthony Calhoun has completed 9 cycles of FOLFOX/bevacizumab.  Oxaliplatin was held with cycle 9 due to neuropathy symptoms.  The symptoms have improved.  The CEA has been higher over the past 2 months.  Anthony Calhoun is concerned the metastatic colon cancer is no longer responding to the FOLFOX/Avastin.  We decided to hold chemotherapy today.  He will undergo a restaging CT prior to an office visit next week.  We discussed potential salvage treatment options including FOLFIRI, Lonsurf/bevacizumab, and fruquintinib.  We also discussed referral to [facility] for a K-ras inhibitor trial.  I reviewed the case with the medical oncology service at [facility] with regard to the PMS 2 mutation.  The chance of clinical improvement with immunotherapy is small since the tumor is microsatellite stable. Thornton Papas, MD  01/29/2023   10:53 AM

## 2023-01-29 NOTE — Progress Notes (Signed)
Patient seen by Dr. Truett Perna today  Vitals are within treatment parameters.  Labs reviewed by Dr. Truett Perna and are within treatment parameters.  Per physician team, patient will not be receiving treatment today. Pt will proceed with scans prior to next tx.

## 2023-01-30 ENCOUNTER — Other Ambulatory Visit: Payer: Self-pay

## 2023-01-31 ENCOUNTER — Inpatient Hospital Stay: Payer: Medicaid Other

## 2023-02-05 ENCOUNTER — Telehealth: Payer: Medicaid Other | Admitting: Adult Health

## 2023-02-05 ENCOUNTER — Inpatient Hospital Stay: Payer: Medicaid Other

## 2023-02-05 ENCOUNTER — Encounter: Payer: Self-pay | Admitting: Oncology

## 2023-02-05 ENCOUNTER — Ambulatory Visit (HOSPITAL_BASED_OUTPATIENT_CLINIC_OR_DEPARTMENT_OTHER)
Admission: RE | Admit: 2023-02-05 | Discharge: 2023-02-05 | Disposition: A | Discharge status: Home / Self Care | Payer: Medicaid Other | Source: Ambulatory Visit | Attending: Oncology | Admitting: Oncology

## 2023-02-05 ENCOUNTER — Telehealth: Payer: Self-pay

## 2023-02-05 DIAGNOSIS — C787 Secondary malignant neoplasm of liver and intrahepatic bile duct: Secondary | ICD-10-CM | POA: Insufficient documentation

## 2023-02-05 DIAGNOSIS — C189 Malignant neoplasm of colon, unspecified: Secondary | ICD-10-CM | POA: Insufficient documentation

## 2023-02-05 MED ORDER — IOHEXOL 300 MG/ML  SOLN
100.0000 mL | Freq: Once | INTRAMUSCULAR | Status: AC | PRN
  Administered 2023-02-05: 90 mL via INTRAVENOUS

## 2023-02-05 NOTE — Telephone Encounter (Signed)
The patient requested a case of Ensure. I provided the patient with a pack of Glucerna instead.

## 2023-02-06 ENCOUNTER — Other Ambulatory Visit: Payer: Self-pay

## 2023-02-07 ENCOUNTER — Inpatient Hospital Stay: Payer: Medicaid Other | Admitting: Oncology

## 2023-02-07 ENCOUNTER — Encounter: Payer: Self-pay | Admitting: Oncology

## 2023-02-07 ENCOUNTER — Telehealth: Payer: Self-pay | Admitting: Oncology

## 2023-02-07 VITALS — BP 121/68 | HR 85 | Temp 98.1°F | Resp 18 | Ht 66.0 in | Wt 233.9 lb

## 2023-02-07 DIAGNOSIS — C189 Malignant neoplasm of colon, unspecified: Secondary | ICD-10-CM

## 2023-02-07 DIAGNOSIS — Z5111 Encounter for antineoplastic chemotherapy: Secondary | ICD-10-CM | POA: Diagnosis not present

## 2023-02-07 DIAGNOSIS — C787 Secondary malignant neoplasm of liver and intrahepatic bile duct: Secondary | ICD-10-CM | POA: Diagnosis not present

## 2023-02-07 NOTE — Progress Notes (Signed)
[facility] OFFICE PROGRESS NOTE   Diagnosis: Colon cancer  INTERVAL HISTORY:   Anthony Calhoun returns as scheduled.  He reports mild burning at the tongue and altered taste.  No numbness or tingling.  No peripheral numbness.  Good appetite.  He reports his blood sugars under better control.  He has altered his diet.  He has been seen by endocrinology.  Objective:  Vital signs in last 24 hours:  Blood pressure 121/68, pulse 85, temperature 98.1 F (36.7 C), temperature source Oral, resp. rate 18, height 5\' 6"  (1.676 m), weight 233 lb 14.4 oz (106.1 kg), SpO2 98 %.    HEENT: No thrush, 1-2 mm ulcer at the anterior right buccal mucosa Resp: Lungs clear bilaterally Cardio: Regular rate and rhythm GI: Nontender, no hepatosplenomegaly, no mass Vascular: No leg edema     Portacath/PICC-without erythema  Lab Results:  Lab Results  Component Value Date   WBC 3.3 (L) 01/29/2023   HGB 11.4 (L) 01/29/2023   HCT 36.3 (L) 01/29/2023   MCV 81.8 01/29/2023   PLT 130 (L) 01/29/2023   NEUTROABS 1.8 01/29/2023    CMP  Lab Results  Component Value Date   NA 138 01/29/2023   K 3.6 01/29/2023   CL 105 01/29/2023   CO2 27 01/29/2023   GLUCOSE 208 (H) 01/29/2023   BUN 8 01/29/2023   CREATININE 0.64 01/29/2023   CALCIUM 8.8 (L) 01/29/2023   PROT 7.0 01/29/2023   ALBUMIN 3.5 01/29/2023   AST 48 (H) 01/29/2023   ALT 43 01/29/2023   ALKPHOS 246 (H) 01/29/2023   BILITOT 1.1 01/29/2023   GFRNONAA >60 01/29/2023   GFRAA >60 05/10/2020    Lab Results  Component Value Date   CEA1 1.08 03/07/2021   CEA 55.42 (H) 01/29/2023    Lab Results  Component Value Date   INR 1.0 02/08/2022   LABPROT 13.5 02/08/2022    Imaging:  CT CHEST ABDOMEN PELVIS W CONTRAST  Result Date: 02/06/2023 CLINICAL DATA:  Metastatic colon cancer; * Tracking Code: BO * EXAM: CT CHEST, ABDOMEN, AND PELVIS WITH CONTRAST TECHNIQUE: Multidetector CT imaging of the chest, abdomen and pelvis  was performed following the standard protocol during bolus administration of intravenous contrast. RADIATION DOSE REDUCTION: This exam was performed according to the departmental dose-optimization program which includes automated exposure control, adjustment of the mA and/or kV according to patient size and/or use of iterative reconstruction technique. CONTRAST:  90mL OMNIPAQUE IOHEXOL 300 MG/ML  SOLN COMPARISON:  CT abdomen and pelvis dated December 14, 2018; PET-CT dated July 26, 2022 FINDINGS: CT CHEST FINDINGS Cardiovascular: Normal heart size. No pericardial effusion. Normal caliber thoracic aorta with mild atherosclerotic disease. Moderate coronary artery calcifications. Mediastinum/Nodes: Esophagus and thyroid are unremarkable. Stable mildly enlarged right lower paratracheal lymph nodes, non FDG avid prior PET-CT and likely reactive. Reference right upper paratracheal lymph node measuring 11 mm in short axis on series 4, image 16. No enlarged lymph nodes seen in the chest. Lungs/Pleura: Central airways are patent. Mild bibasilar atelectasis. No consolidation, pleural effusion or pneumothorax. Musculoskeletal: No chest wall mass or suspicious bone lesions identified. CT ABDOMEN PELVIS FINDINGS Hepatobiliary: Stable low-attenuation lesions of the right hepatic lobe. Reference lesion measuring 2.3 x 1.4 cm on series 4, image 463. Normal appearance of the gallbladder. No biliary ductal dilation. Pancreas: Unremarkable. No pancreatic ductal dilatation or surrounding inflammatory changes. Spleen: Stable splenomegaly. Adrenals/Urinary Tract: Bilateral adrenal glands are unremarkable. No hydronephrosis or nephrolithiasis. Bladder is decompressed. Stomach/Bowel: Stomach is within  normal limits. Prior right hemicolectomy. No evidence of bowel wall thickening, distention, or inflammatory changes. Vascular/Lymphatic: Aortic atherosclerosis. Stable mesenteric nodules. Reference lesion measuring 2.1 x 1.4 cm on series 4,  image 78 and reference lesion measuring 9 mm on image 83. Stable mildly enlarged periportal lymph node measuring 11 mm on series 4, image 58. Reproductive: Prostate is unremarkable. Other: Stable omental of the anterior left abdomen measuring 2.6 x 2.3 cm, unchanged when compared with the prior exam. No abdominopelvic ascites. Musculoskeletal: No aggressive appearing osseous lesions. IMPRESSION: 1. Stable liver lesions, omental nodule, and mesenteric lymph nodes. No evidence of progressive disease in the chest, abdomen or pelvis. 2. Unchanged splenomegaly. 3. Aortic Atherosclerosis (ICD10-I70.0). Electronically Signed   By: Allegra Lai M.D.   On: 02/06/2023 15:34    Medications: I have reviewed the patient's current medications.   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 secondary to nausea (patient discontinued Xeloda on day 11 due to colonoscopy) Colonoscopy 07/09/2020-nodular mucosa at the colonic anastomosis, biopsied; patent end-to-side ileocolonic anastomosis characterized by healthy-appearing mucosa and visible sutures; nonbleeding internal hemorrhoids, biopsy with benign ulcerated anastomotic mucosa with granulation tissue Cycle 4 adjuvant Xeloda 07/19/2020, 2000 mg every morning and 1500 mg every afternoon Cycle 5 adjuvant Xeloda 08/09/2020 Cycle 6 adjuvant Xeloda 08/31/2019 Cycle 7 adjuvant Xeloda 09/20/2020 Cycle 8 adjuvant Xeloda 10/11/2020 CTs 03/07/2021-no evidence of recurrent disease, hepatic  steatosis, slight wall thickening at the junction of the descending and horizontal duodenum Mild elevation of CEA 2023 12/01/2021-Guardant Reveal-ct DNA detected PET 12/30/2021-hypermetabolic right liver lesion, hypermetabolic area in a loop of mid small bowel with an SUV of 12.5, review of PET images at GI tumor conference 01/11/2022-small bowel uptake felt to be a benign finding MRI liver 01/10/2022-solitary 1.2 cm right liver lesion between segments 7 and 8 compatible with metastasis, subtle changes suggesting cirrhosis, hepatic steatosis CT enteroscopy 02/02/2022-small bowel loop with hypermetabolic activity on PET continues to have a bandlike density along the margin felt to represent a diverticulum or adjacent nodal tissue.  Small bowel tumor not excluded.  3-4 mm left omental nodule 02/08/2022-biopsy and ablation of solitary liver lesion, adenocarcinoma consistent with metastatic colorectal adenocarcinoma CTs 03/31/2022-unchanged circumstantial soft tissue thickening surrounding a loop of mid to distal small bowel with an adjacent nodular focus measuring 1.2 cm.,  Ablation cavity in segment 7, no other evidence of metastatic disease Diagnostic laparoscopy 05/26/2022-mid small bowel mass-resected, well to moderately differentiated adenocarcinoma, 0/2 lymph nodes, morphology consistent with a colon primary, tumor involved full-thickness including serosa with perineural and large vessel involvement; foundation 1-microsatellite stable, tumor mutation burden 2, K-ras Q61H CT abdomen/pelvis 07/10/2022-the right liver ablation defect is smaller, new subtle right liver lesion measuring 13 x 9 mm, enlarged lymph node in the right upper quadrant mesentery adjacent to surgical anastomosis PET 07/26/2022-new segment 6 liver lesion, enlarging hypermetabolic left omental lesion, there is another mildly hypermetabolic peritoneal implant, 7 mm omental nodule at the midline without hypermetabolism Cycle 1 FOLFOX  09/25/2022 Cycle 2 FOLFOX/bevacizumab 10/09/2022, 5-FU dose reduced secondary to mucositis Cycle 3 FOLFOX/bevacizumab 10/23/2022 Cycle 4 FOLFOX/bevacizumab 11/06/2022 Cycle 5 FOLFOX/bevacizumab  11/20/2022 Cycle 6 FOLFOX/bevacizumab 12/04/2022 CT abdomen/pelvis 12/14/2022-further contraction of the treated segment 7 lesion, slight enlargement of segment 6 lesion, no new liver lesion, increased size of ileocolic nodes and an omental lesion, stable small retroperitoneal nodes Cycle 7 FOLFOX/bevacizumab 12/18/2022 Cycle 8 FOLFOX/bevacizumab 01/02/2023, oxaliplatin dose reduced secondary to prolonged nausea and neuropathy symptoms, Emend added Cycle 9 FOLFOX/bevacizumab 01/16/2023, oxaliplatin discontinued secondary to neuropathy Microcytic anemia secondary to #1-improved Colon polyps-sessile serrated adenoma of the descending colon, tubular adenomas with focal high-grade dysplasia of the transverse colon on colonoscopy 02/12/2020, tubular adenoma of the mid ascending colon on the surgical specimen 04/12/2020 Family history of colon cancer Depression Anxiety/panic attacks Hypertension Hyperlipidemia Nausea secondary to Xeloda?-Resolved Gastroesophageal reflux disease-improved following discontinuation of Xeloda Anemia, microcytic; ferritin 6 01/24/2022; stool positive for blood x3 01/25/2022; 02/02/2022 CT abdomen/pelvis enterography-bowel loop demonstrating hypermetabolic activity continues to have a bandlike density along its margin possibly representing a diverticulum or adjacent nodal tissue, difficult to exclude small bowel tumor, 3 x 4 mm left omental nodule or lymph node, known right hepatic lobe tumor is occult on CT. Venofer weekly x3 beginning 03/03/2022 Negative capsule endoscopy 03/09/2022 Venofer 03/03/2022 for 3 weekly doses Venofer 05/03/2022, 05/19/2022 Venofer 07/28/2022, 08/04/2022 12.  Anterior left neck nodule 03/17/2022-cyst?,  Lymph node? 13.  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  Oxaliplatin neuropathy-mild loss of vibratory sense 12/04/2022, 12/18/2022, 01/02/2023     Disposition: Anthony Calhoun has metastatic colon cancer.  He has completed 9 cycles of FOLFOX/bevacizumab, oxaliplatin was held with cycle 9.  I reviewed the restaging CT findings and images with him.  The CT is consistent with stable disease.  The CEA has been higher over the past 2 months.  We discussed continuing 5-FU/bevacizumab.  He is likely reached maximum benefit from oxaliplatin and he is developing oxaliplatin neuropathy.  We also discussed changing treatment to FOLFIRI/bevacizumab.  We discussed referral for a K-ras inhibitor trial, Lonsurf/bevacizumab, and fruquintinib.  Anthony Calhoun is not comfortable continuing FOLFOX/bevacizumab due to the rise in the CEA in lack of additional shrinkage of measurable disease.  He would like to change to a different systemic therapy regimen.  I recommend FOLFIRI/bevacizumab.  We reviewed potential toxicities associated with irinotecan including the chance of nausea/vomiting, hematologic toxicity, alopecia, and diarrhea.  He was given reading materials on irinotecan.  He plans to see a dentist next week for potential extraction of a sore tooth.  He will be 4 weeks out from the last dose of bevacizumab next week.  He will alert his dentist of the bevacizumab treatment.  40 minutes were spent with the patient today.  The majority of the time was used for counseling and coordination of care.  A chemotherapy plan was entered today. Thornton Papas, MD  02/07/2023  8:56 AM

## 2023-02-07 NOTE — Progress Notes (Signed)
DISCONTINUE ON PATHWAY REGIMEN - Colorectal     A cycle is every 14 days:     Bevacizumab-xxxx      Oxaliplatin      Leucovorin      Fluorouracil      Fluorouracil   **Always confirm dose/schedule in your pharmacy ordering system**  REASON: Disease Progression PRIOR TREATMENT: [id number]: [id number] + Bevacizumab q14 Days TREATMENT RESPONSE: Partial Response (PR)  START ON PATHWAY REGIMEN - Colorectal     A cycle is every 14 days:     Bevacizumab-xxxx      Irinotecan      Leucovorin      Fluorouracil      Fluorouracil   **Always confirm dose/schedule in your pharmacy ordering system**  Patient Characteristics: Distant Metastases, Nonsurgical Candidate, Non-KRAS G12C, RAS Mutation Positive/Unknown (BRAF V600 Wild-Type/Unknown), Standard Cytotoxic Therapy, Second Line Standard Cytotoxic Therapy, Bevacizumab Eligible Tumor Location: Colon Therapeutic Status: Distant Metastases Microsatellite/Mismatch Repair Status: MSS/pMMR BRAF Mutation Status: Wild-Type (no mutation) KRAS/NRAS Mutation Status: Non-KRAS G12C, RAS Mutation Positive Preferred Therapy Approach: Standard Cytotoxic Therapy Standard Cytotoxic Line of Therapy: Second Line Standard Cytotoxic Therapy Bevacizumab Eligibility: Eligible Intent of Therapy: Non-Curative / Palliative Intent, Discussed with Patient

## 2023-02-07 NOTE — Telephone Encounter (Signed)
Scheduled per 06/12 los/woek-queue, patient has been called and notified.

## 2023-02-08 ENCOUNTER — Other Ambulatory Visit: Payer: Self-pay

## 2023-02-08 ENCOUNTER — Inpatient Hospital Stay: Payer: Medicaid Other

## 2023-02-08 NOTE — Progress Notes (Signed)
[facility] CSW Progress Note  Visual merchandiser spoke with patient via telephone to discuss dentists.  He is having difficulty getting an appointment before he begins treatment on 6/24.  Consulted Nurse Navigator and she provided a list which CSW securely emailed to patient.  Also referred him to Vernell Leep for his dietary supplements.   Dorothey Baseman, LCSW Clinical Social Worker [facility]    Patient is participating in a Managed Medicaid Plan:  Yes

## 2023-02-09 ENCOUNTER — Encounter: Payer: Self-pay | Admitting: Nurse Practitioner

## 2023-02-09 ENCOUNTER — Encounter: Payer: Self-pay | Admitting: Oncology

## 2023-02-09 ENCOUNTER — Inpatient Hospital Stay: Payer: Medicaid Other

## 2023-02-09 NOTE — Progress Notes (Signed)
[facility] CSW Progress Note  Clinical Child psychotherapist contacted patient by phone to discuss dental needs.  He said he did not receive the secured email.  He provided an alternate email:  [email or web address].  CSW securely sent him the list again.  He stated his mother, [name], is a patient of Dr. Gustavo Lah in [facility] and that she would like to speak with CSW.    Dorothey Baseman, LCSW Clinical Social Worker [facility]    Patient is participating in a Managed Medicaid Plan:  Yes

## 2023-02-13 ENCOUNTER — Telehealth: Payer: Medicaid Other | Admitting: Adult Health

## 2023-02-17 ENCOUNTER — Other Ambulatory Visit: Payer: Self-pay | Admitting: Oncology

## 2023-02-19 ENCOUNTER — Inpatient Hospital Stay: Payer: Medicaid Other

## 2023-02-19 ENCOUNTER — Ambulatory Visit: Payer: Medicaid Other

## 2023-02-19 ENCOUNTER — Encounter: Payer: Self-pay | Admitting: Nurse Practitioner

## 2023-02-19 ENCOUNTER — Inpatient Hospital Stay: Payer: Medicaid Other | Admitting: Nurse Practitioner

## 2023-02-19 VITALS — BP 137/81 | HR 91 | Temp 98.1°F | Resp 18 | Ht 66.0 in | Wt 235.6 lb

## 2023-02-19 VITALS — BP 130/69 | HR 93 | Resp 18

## 2023-02-19 DIAGNOSIS — C189 Malignant neoplasm of colon, unspecified: Secondary | ICD-10-CM | POA: Diagnosis not present

## 2023-02-19 DIAGNOSIS — C787 Secondary malignant neoplasm of liver and intrahepatic bile duct: Secondary | ICD-10-CM

## 2023-02-19 DIAGNOSIS — Z5111 Encounter for antineoplastic chemotherapy: Secondary | ICD-10-CM | POA: Diagnosis not present

## 2023-02-19 LAB — CMP (CANCER CENTER ONLY)
ALT: 47 U/L — ABNORMAL HIGH (ref 0–44)
AST: 50 U/L — ABNORMAL HIGH (ref 15–41)
Albumin: 3.7 g/dL (ref 3.5–5.0)
Alkaline Phosphatase: 205 U/L — ABNORMAL HIGH (ref 38–126)
Anion gap: 6 (ref 5–15)
BUN: 7 mg/dL (ref 6–20)
CO2: 27 mmol/L (ref 22–32)
Calcium: 8.9 mg/dL (ref 8.9–10.3)
Chloride: 104 mmol/L (ref 98–111)
Creatinine: 0.65 mg/dL (ref 0.61–1.24)
GFR, Estimated: >60 mL/min (ref >60)
Glucose, Bld: 144 mg/dL — ABNORMAL HIGH (ref 70–99)
Potassium: 3.9 mmol/L (ref 3.5–5.1)
Sodium: 137 mmol/L (ref 135–145)
Total Bilirubin: 0.9 mg/dL (ref 0.3–1.2)
Total Protein: 7.6 g/dL (ref 6.5–8.1)

## 2023-02-19 LAB — CBC WITH DIFFERENTIAL (CANCER CENTER ONLY)
Abs Immature Granulocytes: 0.01 10*3/uL (ref 0.00–0.07)
Basophils Absolute: 0 10*3/uL (ref 0.0–0.1)
Basophils Relative: 1 %
Eosinophils Absolute: 0.2 10*3/uL (ref 0.0–0.5)
Eosinophils Relative: 4 %
HCT: 37.3 % — ABNORMAL LOW (ref 39.0–52.0)
Hemoglobin: 11.4 g/dL — ABNORMAL LOW (ref 13.0–17.0)
Immature Granulocytes: 0 %
Lymphocytes Relative: 24 %
Lymphs Abs: 1 10*3/uL (ref 0.7–4.0)
MCH: 24.6 pg — ABNORMAL LOW (ref 26.0–34.0)
MCHC: 30.6 g/dL (ref 30.0–36.0)
MCV: 80.6 fL (ref 80.0–100.0)
Monocytes Absolute: 0.4 10*3/uL (ref 0.1–1.0)
Monocytes Relative: 9 %
Neutro Abs: 2.7 10*3/uL (ref 1.7–7.7)
Neutrophils Relative %: 62 %
Platelet Count: 181 10*3/uL (ref 150–400)
RBC: 4.63 MIL/uL (ref 4.22–5.81)
RDW: 16.7 % — ABNORMAL HIGH (ref 11.5–15.5)
WBC Count: 4.3 10*3/uL (ref 4.0–10.5)
nRBC: 0 % (ref 0.0–0.2)

## 2023-02-19 LAB — TOTAL PROTEIN, URINE DIPSTICK: Protein, ur: NEGATIVE mg/dL

## 2023-02-19 LAB — CEA (ACCESS): CEA (CHCC): 75.2 ng/mL — ABNORMAL HIGH (ref 0.00–5.00)

## 2023-02-19 MED ORDER — SODIUM CHLORIDE 0.9 % IV SOLN
200.0000 mg/m2 | Freq: Once | INTRAVENOUS | Status: AC
  Administered 2023-02-19: 444 mg via INTRAVENOUS
  Filled 2023-02-19: qty 22.2

## 2023-02-19 MED ORDER — SODIUM CHLORIDE 0.9 % IV SOLN: Freq: Once | INTRAVENOUS | Status: AC

## 2023-02-19 MED ORDER — SODIUM CHLORIDE 0.9 % IV SOLN
10.0000 mg | Freq: Once | INTRAVENOUS | Status: AC
  Administered 2023-02-19: 10 mg via INTRAVENOUS
  Filled 2023-02-19: qty 10

## 2023-02-19 MED ORDER — SODIUM CHLORIDE 0.9 % IV SOLN
180.0000 mg/m2 | Freq: Once | INTRAVENOUS | Status: AC
  Administered 2023-02-19: 400 mg via INTRAVENOUS
  Filled 2023-02-19: qty 15

## 2023-02-19 MED ORDER — PALONOSETRON HCL INJECTION 0.25 MG/5ML
0.2500 mg | Freq: Once | INTRAVENOUS | Status: AC
  Administered 2023-02-19: 0.25 mg via INTRAVENOUS
  Filled 2023-02-19: qty 5

## 2023-02-19 MED ORDER — ATROPINE SULFATE 1 MG/ML IV SOLN
0.5000 mg | Freq: Once | INTRAVENOUS | Status: AC | PRN
  Administered 2023-02-19: 0.5 mg via INTRAVENOUS
  Filled 2023-02-19: qty 1

## 2023-02-19 MED ORDER — FLUOROURACIL CHEMO INJECTION 500 MG/10ML
200.0000 mg/m2 | Freq: Once | INTRAVENOUS | Status: AC
  Administered 2023-02-19: 450 mg via INTRAVENOUS
  Filled 2023-02-19: qty 9

## 2023-02-19 MED ORDER — SODIUM CHLORIDE 0.9 % IV SOLN
1600.0000 mg/m2 | INTRAVENOUS | Status: DC
  Administered 2023-02-19: 3500 mg via INTRAVENOUS
  Filled 2023-02-19: qty 70

## 2023-02-19 NOTE — Progress Notes (Signed)
[facility] OFFICE PROGRESS NOTE   Diagnosis: Colon cancer  INTERVAL HISTORY:   Mr. Chretien returns as scheduled.  He reports undergoing extraction of 2 teeth a week ago.  He continues to have pain which he feels is more so than he should be experiencing at this point.  He continues to have mild intermittent nausea.  Bowels are moving.  Blood sugars ranging from the 100s to 300s.  Objective:  Vital signs in last 24 hours:  Blood pressure 137/81, pulse 91, temperature 98.1 F (36.7 C), temperature source Oral, resp. rate 18, height 5\' 6"  (1.676 m), weight 235 lb 9.6 oz (106.9 kg), SpO2 98 %.    HEENT: Extraction site at the left upper gumline and right lower gumline do not appear completely healed. Resp: Lungs clear bilaterally. Cardio: Regular rate and rhythm. GI: No hepatosplenomegaly. Vascular: No leg edema. Skin: Palms without erythema. Port-A-Cath without erythema.  Lab Results:  Lab Results  Component Value Date   WBC 4.3 02/19/2023   HGB 11.4 (L) 02/19/2023   HCT 37.3 (L) 02/19/2023   MCV 80.6 02/19/2023   PLT 181 02/19/2023   NEUTROABS 2.7 02/19/2023    Imaging:  No results found.  Medications: I have reviewed the patient's current medications.  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 secondary to nausea (patient discontinued Xeloda on day 11 due to colonoscopy) Colonoscopy 07/09/2020-nodular mucosa at the colonic anastomosis, biopsied; patent end-to-side  ileocolonic anastomosis characterized by healthy-appearing mucosa and visible sutures; nonbleeding internal hemorrhoids, biopsy with benign ulcerated anastomotic mucosa with granulation tissue Cycle 4 adjuvant Xeloda 07/19/2020, 2000 mg every morning and 1500 mg every afternoon Cycle 5 adjuvant Xeloda 08/09/2020 Cycle 6 adjuvant Xeloda 08/31/2019 Cycle 7 adjuvant Xeloda 09/20/2020 Cycle 8 adjuvant Xeloda 10/11/2020 CTs 03/07/2021-no evidence of recurrent disease, hepatic steatosis, slight wall thickening at the junction of the descending and horizontal duodenum Mild elevation of CEA 2023 12/01/2021-Guardant Reveal-ct DNA detected PET 12/30/2021-hypermetabolic right liver lesion, hypermetabolic area in a loop of mid small bowel with an SUV of 12.5, review of PET images at GI tumor conference 01/11/2022-small bowel uptake felt to be a benign finding MRI liver 01/10/2022-solitary 1.2 cm right liver lesion between segments 7 and 8 compatible with metastasis, subtle changes suggesting cirrhosis, hepatic steatosis CT enteroscopy 02/02/2022-small bowel loop with hypermetabolic activity on PET continues to have a bandlike density along the margin felt to represent a diverticulum or adjacent nodal tissue.  Small bowel tumor not excluded.  3-4 mm left omental nodule 02/08/2022-biopsy and ablation of solitary liver lesion, adenocarcinoma consistent with metastatic colorectal adenocarcinoma CTs 03/31/2022-unchanged circumstantial soft tissue thickening surrounding a loop of mid to distal small bowel with an adjacent nodular focus measuring 1.2 cm.,  Ablation cavity in segment 7, no other evidence of metastatic disease Diagnostic laparoscopy 05/26/2022-mid small bowel mass-resected, well to moderately differentiated adenocarcinoma, 0/2 lymph nodes, morphology consistent with a colon primary, tumor involved full-thickness including serosa with perineural and large vessel involvement; foundation 1-microsatellite stable, tumor  mutation burden 2, K-ras Q61H CT abdomen/pelvis 07/10/2022-the right liver ablation defect is smaller, new subtle right  liver lesion measuring 13 x 9 mm, enlarged lymph node in the right upper quadrant mesentery adjacent to surgical anastomosis PET 07/26/2022-new segment 6 liver lesion, enlarging hypermetabolic left omental lesion, there is another mildly hypermetabolic peritoneal implant, 7 mm omental nodule at the midline without hypermetabolism Cycle 1 FOLFOX 09/25/2022 Cycle 2 FOLFOX/bevacizumab 10/09/2022, 5-FU dose reduced secondary to mucositis Cycle 3 FOLFOX/bevacizumab 10/23/2022 Cycle 4 FOLFOX/bevacizumab 11/06/2022 Cycle 5 FOLFOX/bevacizumab 11/20/2022 Cycle 6 FOLFOX/bevacizumab 12/04/2022 CT abdomen/pelvis 12/14/2022-further contraction of the treated segment 7 lesion, slight enlargement of segment 6 lesion, no new liver lesion, increased size of ileocolic nodes and an omental lesion, stable small retroperitoneal nodes Cycle 7 FOLFOX/bevacizumab 12/18/2022 Cycle 8 FOLFOX/bevacizumab 01/02/2023, oxaliplatin dose reduced secondary to prolonged nausea and neuropathy symptoms, Emend added Cycle 9 FOLFOX/bevacizumab 01/16/2023, oxaliplatin discontinued secondary to neuropathy Cycle 1 FOLFIRI 02/19/2023, Avastin held due to recent dental extractions Microcytic anemia secondary to #1-improved Colon polyps-sessile serrated adenoma of the descending colon, tubular adenomas with focal high-grade dysplasia of the transverse colon on colonoscopy 02/12/2020, tubular adenoma of the mid ascending colon on the surgical specimen 04/12/2020 Family history of colon cancer Depression Anxiety/panic attacks Hypertension Hyperlipidemia Nausea secondary to Xeloda?-Resolved Gastroesophageal reflux disease-improved following discontinuation of Xeloda Anemia, microcytic; ferritin 6 01/24/2022; stool positive for blood x3 01/25/2022; 02/02/2022 CT abdomen/pelvis enterography-bowel loop demonstrating hypermetabolic activity  continues to have a bandlike density along its margin possibly representing a diverticulum or adjacent nodal tissue, difficult to exclude small bowel tumor, 3 x 4 mm left omental nodule or lymph node, known right hepatic lobe tumor is occult on CT. Venofer weekly x3 beginning 03/03/2022 Negative capsule endoscopy 03/09/2022 Venofer 03/03/2022 for 3 weekly doses Venofer 05/03/2022, 05/19/2022 Venofer 07/28/2022, 08/04/2022 12.  Anterior left neck nodule 03/17/2022-cyst?,  Lymph node? 13.  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  Oxaliplatin neuropathy-mild loss of vibratory sense 12/04/2022, 12/18/2022, 01/02/2023  Disposition: [name] appears stable.  He is scheduled to begin treatment today with FOLFIRI/Avastin.  We again reviewed potential toxicities.  He agrees to proceed.  Plan for FOLFIRI alone with treatment today, Avastin will be held due to recent dental extractions.  CBC and chemistry panel reviewed.  Labs adequate to proceed as above.  He will return for follow-up in 2 weeks.  He will schedule follow-up with his dentist in the interim.    Lonna Cobb ANP/GNP-BC   02/19/2023  9:39 AM

## 2023-02-19 NOTE — Progress Notes (Signed)
Patient seen by Lonna Cobb NP today  Vitals are within treatment parameters.  Labs reviewed by Lonna Cobb NP and are within treatment parameters.  Per physician team, patient is ready for treatment. Please note that modifications are being made to the treatment plan including holding avastin.

## 2023-02-19 NOTE — Patient Instructions (Signed)
Anthony Calhoun   Discharge Instructions: Thank you for choosing [facility] to provide your oncology and hematology care.   If you have a lab appointment with the [facility], please go directly to the [facility] and check in at the registration area.   Wear comfortable clothing and clothing appropriate for easy access to any Portacath or PICC line.   We strive to give you quality time with your provider. You may need to reschedule your appointment if you arrive late (15 or more minutes).  Arriving late affects you and other patients whose appointments are after yours.  Also, if you miss three or more appointments without notifying the office, you may be dismissed from the clinic at the provider's discretion.      For prescription refill requests, have your pharmacy contact our office and allow 72 hours for refills to be completed.    Today you received the following chemotherapy and/or immunotherapy agents Irinotecan (CAMPTOSAR), Leucovorin & Flourouracil (ADRUCIL).      To help prevent nausea and vomiting after your treatment, we encourage you to take your nausea medication as directed.  BELOW ARE SYMPTOMS THAT SHOULD BE REPORTED IMMEDIATELY: *FEVER GREATER THAN 100.4 F (38 C) OR HIGHER *CHILLS OR SWEATING *NAUSEA AND VOMITING THAT IS NOT CONTROLLED WITH YOUR NAUSEA MEDICATION *UNUSUAL SHORTNESS OF BREATH *UNUSUAL BRUISING OR BLEEDING *URINARY PROBLEMS (pain or burning when urinating, or frequent urination) *BOWEL PROBLEMS (unusual diarrhea, constipation, pain near the anus) TENDERNESS IN MOUTH AND THROAT WITH OR WITHOUT PRESENCE OF ULCERS (sore throat, sores in mouth, or a toothache) UNUSUAL RASH, SWELLING OR PAIN  UNUSUAL VAGINAL DISCHARGE OR ITCHING   Items with * indicate a potential emergency and should be followed up as soon as possible or go to the Emergency Department if any problems should occur.  Please show the  CHEMOTHERAPY ALERT CARD or IMMUNOTHERAPY ALERT CARD at check-in to the Emergency Department and triage nurse.  Should you have questions after your visit or need to cancel or reschedule your appointment, please contact [facility]  Dept: [phone]  and follow the prompts.  Office hours are 8:00 a.m. to 4:30 p.m. Monday - Friday. Please note that voicemails left after 4:00 p.m. may not be returned until the following business day.  We are closed weekends and major holidays. You have access to a nurse at all times for urgent questions. Please call the main number to the clinic Dept: [phone] and follow the prompts.   For any non-urgent questions, you may also contact your provider using MyChart. We now offer e-Visits for anyone 4 and older to request care online for non-urgent symptoms. For details visit [email or web address].   Also download the MyChart app! Go to the app store, search "MyChart", open the app, select [facility], and log in with your MyChart username and password.  Irinotecan Injection What is this medication? IRINOTECAN (ir in oh TEE kan) treats some types of cancer. It works by slowing down the growth of cancer cells. This medicine may be used for other purposes; ask your health care provider or pharmacist if you have questions. COMMON BRAND NAME(S): Camptosar What should I tell my care team before I take this medication? They need to know if you have any of these conditions: Dehydration Diarrhea Infection, especially a viral infection, such as chickenpox, cold sores, herpes Liver disease Low blood cell levels (white cells, red cells, and platelets) Low levels of electrolytes,  such as calcium, magnesium, or potassium in your blood Recent or ongoing radiation An unusual or allergic reaction to irinotecan, other medications, foods, dyes, or preservatives If you or your partner are pregnant or trying to get  pregnant Breast-feeding How should I use this medication? This medication is injected into a vein. It is given by your care team in a hospital or clinic setting. Talk to your care team about the use of this medication in children. Special care may be needed. Overdosage: If you think you have taken too much of this medicine contact a poison control center or emergency room at once. NOTE: This medicine is only for you. Do not share this medicine with others. What if I miss a dose? Keep appointments for follow-up doses. It is important not to miss your dose. Call your care team if you are unable to keep an appointment. What may interact with this medication? Do not take this medication with any of the following: Cobicistat Itraconazole This medication may also interact with the following: Certain antibiotics, such as clarithromycin, rifampin, rifabutin Certain antivirals for HIV or AIDS Certain medications for fungal infections, such as ketoconazole, posaconazole, voriconazole Certain medications for seizures, such as carbamazepine, phenobarbital, phenytoin Gemfibrozil Nefazodone St. John's wort This list may not describe all possible interactions. Give your health care provider a list of all the medicines, herbs, non-prescription drugs, or dietary supplements you use. Also tell them if you smoke, drink alcohol, or use illegal drugs. Some items may interact with your medicine. What should I watch for while using this medication? Your condition will be monitored carefully while you are receiving this medication. You may need blood work while taking this medication. This medication may make you feel generally unwell. This is not uncommon as chemotherapy can affect healthy cells as well as cancer cells. Report any side effects. Continue your course of treatment even though you feel ill unless your care team tells you to stop. This medication can cause serious side effects. To reduce the risk, your  care team may give you other medications to take before receiving this one. Be sure to follow the directions from your care team. This medication may affect your coordination, reaction time, or judgement. Do not drive or operate machinery until you know how this medication affects you. Sit up or stand slowly to reduce the risk of dizzy or fainting spells. Drinking alcohol with this medication can increase the risk of these side effects. This medication may increase your risk of getting an infection. Call your care team for advice if you get a fever, chills, sore throat, or other symptoms of a cold or flu. Do not treat yourself. Try to avoid being around people who are sick. Avoid taking medications that contain aspirin, acetaminophen, ibuprofen, naproxen, or ketoprofen unless instructed by your care team. These medications may hide a fever. This medication may increase your risk to bruise or bleed. Call your care team if you notice any unusual bleeding. Be careful brushing or flossing your teeth or using a toothpick because you may get an infection or bleed more easily. If you have any dental work done, tell your dentist you are receiving this medication. Talk to your care team if you or your partner are pregnant or think either of you might be pregnant. This medication can cause serious birth defects if taken during pregnancy and for 6 months after the last dose. You will need a negative pregnancy test before starting this medication. Contraception is recommended while  taking this medication and for 6 months after the last dose. Your care team can help you find the option that works for you. Do not father a child while taking this medication and for 3 months after the last dose. Use a condom for contraception during this time period. Do not breastfeed while taking this medication and for 7 days after the last dose. This medication may cause infertility. Talk to your care team if you are concerned about  your fertility. What side effects may I notice from receiving this medication? Side effects that you should report to your care team as soon as possible: Allergic reactions--skin rash, itching, hives, swelling of the face, lips, tongue, or throat Dry cough, shortness of breath or trouble breathing Increased saliva or tears, increased sweating, stomach cramping, diarrhea, small pupils, unusual weakness or fatigue, slow heartbeat Infection--fever, chills, cough, sore throat, wounds that don't heal, pain or trouble when passing urine, general feeling of discomfort or being unwell Kidney injury--decrease in the amount of urine, swelling of the ankles, hands, or feet Low red blood cell level--unusual weakness or fatigue, dizziness, headache, trouble breathing Severe or prolonged diarrhea Unusual bruising or bleeding Side effects that usually do not require medical attention (report to your care team if they continue or are bothersome): Constipation Diarrhea Hair loss Loss of appetite Nausea Stomach pain This list may not describe all possible side effects. Call your doctor for medical advice about side effects. You may report side effects to FDA at 1-800-FDA-1088. Where should I keep my medication? This medication is given in a hospital or clinic. It will not be stored at home. NOTE: This sheet is a summary. It may not cover all possible information. If you have questions about this medicine, talk to your doctor, pharmacist, or health care provider.  2024 Elsevier/Gold Standard (2021-12-26 00:00:00)  Leucovorin Injection What is this medication? LEUCOVORIN (loo koe VOR in) prevents side effects from certain medications, such as methotrexate. It works by increasing folate levels. This helps protect healthy cells in your body. It may also be used to treat anemia caused by low levels of folate. It can also be used with fluorouracil, a type of chemotherapy, to treat colorectal cancer. It works by  increasing the effects of fluorouracil in the body. This medicine may be used for other purposes; ask your health care provider or pharmacist if you have questions. What should I tell my care team before I take this medication? They need to know if you have any of these conditions: Anemia from low levels of vitamin B12 in the blood An unusual or allergic reaction to leucovorin, folic acid, other medications, foods, dyes, or preservatives Pregnant or trying to get pregnant Breastfeeding How should I use this medication? This medication is injected into a vein or a muscle. It is given by your care team in a hospital or clinic setting. Talk to your care team about the use of this medication in children. Special care may be needed. Overdosage: If you think you have taken too much of this medicine contact a poison control center or emergency room at once. NOTE: This medicine is only for you. Do not share this medicine with others. What if I miss a dose? Keep appointments for follow-up doses. It is important not to miss your dose. Call your care team if you are unable to keep an appointment. What may interact with this medication? Capecitabine Fluorouracil Phenobarbital Phenytoin Primidone Trimethoprim;sulfamethoxazole This list may not describe all possible interactions. Give  your health care provider a list of all the medicines, herbs, non-prescription drugs, or dietary supplements you use. Also tell them if you smoke, drink alcohol, or use illegal drugs. Some items may interact with your medicine. What should I watch for while using this medication? Your condition will be monitored carefully while you are receiving this medication. This medication may increase the side effects of 5-fluorouracil. Tell your care team if you have diarrhea or mouth sores that do not get better or that get worse. What side effects may I notice from receiving this medication? Side effects that you should report to  your care team as soon as possible: Allergic reactions--skin rash, itching, hives, swelling of the face, lips, tongue, or throat This list may not describe all possible side effects. Call your doctor for medical advice about side effects. You may report side effects to FDA at 1-800-FDA-1088. Where should I keep my medication? This medication is given in a hospital or clinic. It will not be stored at home. NOTE: This sheet is a summary. It may not cover all possible information. If you have questions about this medicine, talk to your doctor, pharmacist, or health care provider.  2024 Elsevier/Gold Standard (2022-01-17 00:00:00)  Fluorouracil Injection What is this medication? FLUOROURACIL (flure oh YOOR a sil) treats some types of cancer. It works by slowing down the growth of cancer cells. This medicine may be used for other purposes; ask your health care provider or pharmacist if you have questions. COMMON BRAND NAME(S): Adrucil What should I tell my care team before I take this medication? They need to know if you have any of these conditions: Blood disorders Dihydropyrimidine dehydrogenase (DPD) deficiency Infection, such as chickenpox, cold sores, herpes Kidney disease Liver disease Poor nutrition Recent or ongoing radiation therapy An unusual or allergic reaction to fluorouracil, other medications, foods, dyes, or preservatives If you or your partner are pregnant or trying to get pregnant Breast-feeding How should I use this medication? This medication is injected into a vein. It is administered by your care team in a hospital or clinic setting. Talk to your care team about the use of this medication in children. Special care may be needed. Overdosage: If you think you have taken too much of this medicine contact a poison control center or emergency room at once. NOTE: This medicine is only for you. Do not share this medicine with others. What if I miss a dose? Keep appointments  for follow-up doses. It is important not to miss your dose. Call your care team if you are unable to keep an appointment. What may interact with this medication? Do not take this medication with any of the following: Live virus vaccines This medication may also interact with the following: Medications that treat or prevent blood clots, such as warfarin, enoxaparin, dalteparin This list may not describe all possible interactions. Give your health care provider a list of all the medicines, herbs, non-prescription drugs, or dietary supplements you use. Also tell them if you smoke, drink alcohol, or use illegal drugs. Some items may interact with your medicine. What should I watch for while using this medication? Your condition will be monitored carefully while you are receiving this medication. This medication may make you feel generally unwell. This is not uncommon as chemotherapy can affect healthy cells as well as cancer cells. Report any side effects. Continue your course of treatment even though you feel ill unless your care team tells you to stop. In some cases, you  may be given additional medications to help with side effects. Follow all directions for their use. This medication may increase your risk of getting an infection. Call your care team for advice if you get a fever, chills, sore throat, or other symptoms of a cold or flu. Do not treat yourself. Try to avoid being around people who are sick. This medication may increase your risk to bruise or bleed. Call your care team if you notice any unusual bleeding. Be careful brushing or flossing your teeth or using a toothpick because you may get an infection or bleed more easily. If you have any dental work done, tell your dentist you are receiving this medication. Avoid taking medications that contain aspirin, acetaminophen, ibuprofen, naproxen, or ketoprofen unless instructed by your care team. These medications may hide a fever. Do not treat  diarrhea with over the counter products. Contact your care team if you have diarrhea that lasts more than 2 days or if it is severe and watery. This medication can make you more sensitive to the sun. Keep out of the sun. If you cannot avoid being in the sun, wear protective clothing and sunscreen. Do not use sun lamps, tanning beds, or tanning booths. Talk to your care team if you or your partner wish to become pregnant or think you might be pregnant. This medication can cause serious birth defects if taken during pregnancy and for 3 months after the last dose. A reliable form of contraception is recommended while taking this medication and for 3 months after the last dose. Talk to your care team about effective forms of contraception. Do not father a child while taking this medication and for 3 months after the last dose. Use a condom while having sex during this time period. Do not breastfeed while taking this medication. This medication may cause infertility. Talk to your care team if you are concerned about your fertility. What side effects may I notice from receiving this medication? Side effects that you should report to your care team as soon as possible: Allergic reactions--skin rash, itching, hives, swelling of the face, lips, tongue, or throat Heart attack--pain or tightness in the chest, shoulders, arms, or jaw, nausea, shortness of breath, cold or clammy skin, feeling faint or lightheaded Heart failure--shortness of breath, swelling of the ankles, feet, or hands, sudden weight gain, unusual weakness or fatigue Heart rhythm changes--fast or irregular heartbeat, dizziness, feeling faint or lightheaded, chest pain, trouble breathing High ammonia level--unusual weakness or fatigue, confusion, loss of appetite, nausea, vomiting, seizures Infection--fever, chills, cough, sore throat, wounds that don't heal, pain or trouble when passing urine, general feeling of discomfort or being unwell Low red  blood cell level--unusual weakness or fatigue, dizziness, headache, trouble breathing Pain, tingling, or numbness in the hands or feet, muscle weakness, change in vision, confusion or trouble speaking, loss of balance or coordination, trouble walking, seizures Redness, swelling, and blistering of the skin over hands and feet Severe or prolonged diarrhea Unusual bruising or bleeding Side effects that usually do not require medical attention (report to your care team if they continue or are bothersome): Dry skin Headache Increased tears Nausea Pain, redness, or swelling with sores inside the mouth or throat Sensitivity to light Vomiting This list may not describe all possible side effects. Call your doctor for medical advice about side effects. You may report side effects to FDA at 1-800-FDA-1088. Where should I keep my medication? This medication is given in a hospital or clinic. It will not  be stored at home. NOTE: This sheet is a summary. It may not cover all possible information. If you have questions about this medicine, talk to your doctor, pharmacist, or health care provider.  2024 Elsevier/Gold Standard (2021-12-20 00:00:00)  The chemotherapy medication bag should finish at 46 hours, 96 hours, or 7 days. For example, if your pump is scheduled for 46 hours and it was put on at 4:00 p.m., it should finish at 2:00 p.m. the day it is scheduled to come off regardless of your appointment time.     Estimated time to finish at 11:30 a.m. on Wednesday 02/21/2023.   If the display on your pump reads "Low Volume" and it is beeping, take the batteries out of the pump and come to the cancer center for it to be taken off.   If the pump alarms go off prior to the pump reading "Low Volume" then call [phone] and someone can assist you.  If the plunger comes out and the chemotherapy medication is leaking out, please use your home chemo spill kit to clean up the spill. Do NOT use paper towels  or other household products.  If you have problems or questions regarding your pump, please call either [phone] (24 hours a day) or the cancer center Monday-Friday 8:00 a.m.- 4:30 p.m. at the clinic number and we will assist you. If you are unable to get assistance, then go to the nearest Emergency Department and ask the staff to contact the IV team for assistance.

## 2023-02-20 ENCOUNTER — Encounter: Payer: Self-pay | Admitting: Oncology

## 2023-02-20 ENCOUNTER — Telehealth: Payer: Self-pay

## 2023-02-20 ENCOUNTER — Other Ambulatory Visit: Payer: Self-pay

## 2023-02-20 NOTE — Telephone Encounter (Signed)
24 Hour Call Back  Telephone call to patient post his first time Folfiri infusion. Patient denies any concerns. Reported some mild nausea this morning that went away after breakfast. He reports doing fine overall. He knows to call the clinic if this changes. Patient will be back tomorrow for his pump stop appointment.

## 2023-02-21 ENCOUNTER — Inpatient Hospital Stay: Payer: Medicaid Other

## 2023-02-21 VITALS — BP 121/65 | HR 82 | Temp 97.9°F | Resp 18

## 2023-02-21 DIAGNOSIS — Z5111 Encounter for antineoplastic chemotherapy: Secondary | ICD-10-CM | POA: Diagnosis not present

## 2023-02-21 DIAGNOSIS — C189 Malignant neoplasm of colon, unspecified: Secondary | ICD-10-CM

## 2023-02-21 MED ORDER — HEPARIN SOD (PORK) LOCK FLUSH 100 UNIT/ML IV SOLN
500.0000 [IU] | Freq: Once | INTRAVENOUS | Status: AC | PRN
  Administered 2023-02-21: 500 [IU]

## 2023-02-21 MED ORDER — SODIUM CHLORIDE 0.9% FLUSH
10.0000 mL | INTRAVENOUS | Status: DC | PRN
  Administered 2023-02-21: 10 mL

## 2023-02-21 NOTE — Patient Instructions (Signed)

## 2023-02-22 ENCOUNTER — Inpatient Hospital Stay: Payer: Medicaid Other

## 2023-02-22 ENCOUNTER — Encounter: Payer: Self-pay | Admitting: Adult Health

## 2023-02-22 ENCOUNTER — Telehealth (INDEPENDENT_AMBULATORY_CARE_PROVIDER_SITE_OTHER): Payer: Medicaid Other | Admitting: Adult Health

## 2023-02-22 DIAGNOSIS — F411 Generalized anxiety disorder: Secondary | ICD-10-CM

## 2023-02-22 DIAGNOSIS — F331 Major depressive disorder, recurrent, moderate: Secondary | ICD-10-CM

## 2023-02-22 DIAGNOSIS — F410 Panic disorder [episodic paroxysmal anxiety] without agoraphobia: Secondary | ICD-10-CM

## 2023-02-22 DIAGNOSIS — F429 Obsessive-compulsive disorder, unspecified: Secondary | ICD-10-CM | POA: Diagnosis not present

## 2023-02-22 MED ORDER — SERTRALINE HCL 100 MG PO TABS
100.0000 mg | ORAL_TABLET | Freq: Every day | ORAL | 3 refills | Status: DC
Start: 2023-02-22 — End: 2024-02-07

## 2023-02-22 MED ORDER — ALPRAZOLAM 1 MG PO TABS
ORAL_TABLET | ORAL | 2 refills | Status: DC
Start: 2023-02-22 — End: 2023-08-24

## 2023-02-22 NOTE — Progress Notes (Signed)
Anthony Calhoun [id number] [birth year] 57 y.o.  Virtual Visit via Video Note  I connected with pt @ on 02/22/23 at  3:40 PM EDT by a video enabled telemedicine application and verified that I am speaking with the correct person using two identifiers.   I discussed the limitations of evaluation and management by telemedicine and the availability of in person appointments. The patient expressed understanding and agreed to proceed.  I discussed the assessment and treatment plan with the patient. The patient was provided an opportunity to ask questions and all were answered. The patient agreed with the plan and demonstrated an understanding of the instructions.   The patient was advised to call back or seek an in-person evaluation if the symptoms worsen or if the condition fails to improve as anticipated.  I provided 25 minutes of non-face-to-face time during this encounter.  The patient was located at home.  The provider was located at [facility].   Dorothyann Gibbs, NP   Subjective:   Patient ID:  Anthony Calhoun is a 57 y.o. (DOB 07-03-1966) male.  Chief Complaint: No chief complaint on file.   HPI Anthony Calhoun presents for follow-up of GAD, MDD, OCD, panic attacks.  Describes mood today as "ok". Pleasant. Denies tearfulness. Mood symptoms - reports depression, anxiety, and irritability. Reports worry, rumination, and over thinking. Reports obsessive thoughts and acts. Trying to keep a positive attitude. Mother recently diagnosed with cancer - 19 years old. Mood is variable. Stating "I'm taking it a day at time". Feels like medications are helpful. Varying interest and motivation. Taking medications as prescribed.  Energy levels lower. Active, does not have a regular exercise routine.  Enjoys some usual interests and activities. Engaged. Lives with fiance - has an adult son and daughter - 2 grandchildren. Spending time with family. Appetite improved. Weight loss  - 45 pounds - 233 pounds. Reports sleeping difficulties - "still sporadic". Reports daytime napping. Focus and concentration stable. Completing tasks. Managing aspects of household. Out of work currently - totally disabled. Denies SI or HI.  Denies AH or VH. Denies self harm. Denies substance use.  Previous medication trials:  Celexa, Buspar  Review of Systems:  Review of Systems  Musculoskeletal:  Negative for gait problem.  Neurological:  Negative for tremors.  Psychiatric/Behavioral:         Please refer to HPI    Medications: I have reviewed the patient's current medications.  Current Outpatient Medications  Medication Sig Dispense Refill   acetaminophen (TYLENOL) 500 MG tablet Take 500 mg by mouth every 6 (six) hours as needed. (Patient not taking: Reported on 12/18/2022)     ALPRAZolam (XANAX) 1 MG tablet Take one tablet three times daily as needed for anxiety. 90 tablet 2   amLODipine (NORVASC) 5 MG tablet Take 5 mg by mouth daily.     amoxicillin (AMOXIL) 500 MG tablet Take 500 mg by mouth every 8 (eight) hours.     atorvastatin (LIPITOR) 40 MG tablet Take 40 mg by mouth daily.     carvedilol (COREG) 6.25 MG tablet Take 6.25 mg by mouth 2 (two) times daily with a meal.     HYDROcodone-acetaminophen (NORCO) 5-325 MG tablet Take 1 tablet by mouth every 12 (twelve) hours as needed for moderate pain. (Patient not taking: Reported on 12/18/2022) 30 tablet 0   LANTUS SOLOSTAR 100 UNIT/ML Solostar Pen Inject 5 Units into the skin daily. Taper up daily     lidocaine-prilocaine (EMLA) cream Apply 1 Application  topically as needed. Apply 1/2 tablespoon to port site 1-2 hours prior to stick and cover w/Press-and-Seal to numb site for port access 30 g 0   magic mouthwash SOLN Take 5 mLs by mouth 4 (four) times daily as needed for mouth pain (swish and spit). (Patient not taking: Reported on 02/07/2023) 240 mL 1   ondansetron (ZOFRAN) 8 MG tablet Take 1 tablet (8 mg total) by mouth every 8  (eight) hours as needed for nausea. (Patient not taking: Reported on 01/16/2023) 30 tablet 1   pantoprazole (PROTONIX) 40 MG tablet TAKE 1 TABLET BY MOUTH EVERY DAY 30 tablet 2   polyethylene glycol powder (GLYCOLAX) 17 GM/SCOOP powder Take 17 g by mouth daily. (Patient not taking: Reported on 06/02/2022) 255 g 1   promethazine (PHENERGAN) 25 MG tablet TAKE 1 TABLET BY MOUTH EVERY 6 HOURS AS NEEDED FOR NAUSEA OR VOMITING. 60 tablet 1   sertraline (ZOLOFT) 100 MG tablet Take 1 tablet (100 mg total) by mouth daily. 90 tablet 3   triamcinolone (KENALOG) 0.1 % paste Use as directed 1 Application in the mouth or throat 2 (two) times daily as needed (oral irritation). 5 g 1   No current facility-administered medications for this visit.    Medication Side Effects: None  Allergies:  Allergies  Allergen Reactions   Chlorthalidone Swelling    Swelling of lips   Losartan Potassium-Hctz Swelling    Lip swelling   Cyclobenzaprine Other (See Comments)    Unknown reaction   Gabapentin Nausea Only   Prednisone Anxiety    "panic attack" Panic attacks    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 30s; smoking hx   Ovarian cancer Maternal Aunt        d. after 83   Colon cancer Maternal Uncle        mother's pat half brother; dx unknown age   Lung cancer Half-Brother        mat half brother    Social History   Socioeconomic History   Marital status: Divorced    Spouse name:  Not on file   Number of children: Not on file   Years of education: Not on file   Highest education level: Not on file  Occupational History   Not on file  Tobacco Use   Smoking status: Never   Smokeless tobacco: Never  Vaping Use   Vaping Use: Never used  Substance and Sexual Activity   Alcohol use: No   Drug use: No   Sexual activity: Not on file  Other Topics Concern   Not on file  Social History Narrative   Not on file   Social Determinants of Health   Financial Resource Strain: Medium Risk (01/17/2022)  Overall Financial Resource Strain (CARDIA)    Difficulty of Paying Living Expenses: Somewhat hard  Food Insecurity: No Food Insecurity (05/26/2022)   Hunger Vital Sign    Worried About Running Out of Food in the Last Year: Never true    Ran Out of Food in the Last Year: Never true  Transportation Needs: No Transportation Needs (05/26/2022)   PRAPARE - Administrator, Civil Service (Medical): No    Lack of Transportation (Non-Medical): No  Physical Activity: Inactive (01/17/2022)   Exercise Vital Sign    Days of Exercise per Week: 0 days    Minutes of Exercise per Session: 0 min  Stress: Stress Concern Present (01/17/2022)   [employer] of Occupational Health - Occupational Stress Questionnaire    Feeling of Stress : Rather much  Social Connections: Socially Isolated (01/17/2022)   Social Connection and Isolation Panel [NHANES]    Frequency of Communication with Friends and Family: Three times a week    Frequency of Social Gatherings with Friends and Family: Three times a week    Attends Religious Services: Never    Active Member of Clubs or Organizations: No    Attends Banker Meetings: Never    Marital Status: Divorced  Catering manager Violence: Not At Risk (05/26/2022)   Humiliation, Afraid, Rape, and Kick questionnaire    Fear of Current or Ex-Partner: No    Emotionally Abused: No    Physically Abused: No    Sexually Abused: No     Past Medical History, Surgical history, Social history, and Family history were reviewed and updated as appropriate.   Please see review of systems for further details on the patient's review from today.   Objective:   Physical Exam:  There were no vitals taken for this visit.  Physical Exam Constitutional:      General: He is not in acute distress. Musculoskeletal:        General: No deformity.  Neurological:     Mental Status: He is alert and oriented to person, place, and time.     Coordination: Coordination normal.  Psychiatric:        Attention and Perception: Attention and perception normal. He does not perceive auditory or visual hallucinations.        Mood and Affect: Mood normal. Mood is not anxious or depressed. Affect is not labile, blunt, angry or inappropriate.        Speech: Speech normal.        Behavior: Behavior normal.        Thought Content: Thought content normal. Thought content is not paranoid or delusional. Thought content does not include homicidal or suicidal ideation. Thought content does not include homicidal or suicidal plan.        Cognition and Memory: Cognition and memory normal.        Judgment: Judgment normal.     Comments: Insight intact     Lab Review:     Component Value Date/Time   NA 137 02/19/2023 0857   K 3.9 02/19/2023 0857   CL 104 02/19/2023 0857   CO2 27 02/19/2023 0857   GLUCOSE 144 (H) 02/19/2023 0857   BUN 7 02/19/2023 0857   CREATININE 0.65 02/19/2023 0857   CALCIUM 8.9 02/19/2023 0857   PROT 7.6 02/19/2023 0857   ALBUMIN 3.7 02/19/2023 0857   AST 50 (H) 02/19/2023 0857   ALT 47 (H) 02/19/2023 0857   ALKPHOS 205 (H) 02/19/2023 0857   BILITOT 0.9 02/19/2023 0857  GFRNONAA >60 02/19/2023 0857   GFRAA >60 05/10/2020 1543       Component Value Date/Time   WBC 4.3 02/19/2023 0857   WBC 8.4 05/27/2022 0527   RBC 4.63 02/19/2023 0857   HGB 11.4 (L) 02/19/2023 0857   HCT 37.3 (L) 02/19/2023 0857   PLT 181  02/19/2023 0857   MCV 80.6 02/19/2023 0857   MCH 24.6 (L) 02/19/2023 0857   MCHC 30.6 02/19/2023 0857   RDW 16.7 (H) 02/19/2023 0857   LYMPHSABS 1.0 02/19/2023 0857   MONOABS 0.4 02/19/2023 0857   EOSABS 0.2 02/19/2023 0857   BASOSABS 0.0 02/19/2023 0857    No results found for: "POCLITH", "LITHIUM"   No results found for: "PHENYTOIN", "PHENOBARB", "VALPROATE", "CBMZ"   .res Assessment: Plan:    Plan:  PDMP reviewed  Xanax 1mg  TID Zoloft 100mg  daily   RTC 6 months  Patient advised to contact office with any questions, adverse effects, or acute worsening in signs and symptoms.  Discussed potential benefits, risk, and side effects of benzodiazepines to include potential risk of tolerance and dependence, as well as possible drowsiness.  Advised patient not to drive if experiencing drowsiness and to take lowest possible effective dose to minimize risk of dependence and tolerance.   Time spent with patient was 25 minutes. Greater than 50% of face to face time with patient was spent on counseling and coordination of care.    Diagnoses and all orders for this visit:  Major depressive disorder, recurrent episode, moderate (HCC) -     sertraline (ZOLOFT) 100 MG tablet; Take 1 tablet (100 mg total) by mouth daily.  Generalized anxiety disorder -     ALPRAZolam (XANAX) 1 MG tablet; Take one tablet three times daily as needed for anxiety. -     sertraline (ZOLOFT) 100 MG tablet; Take 1 tablet (100 mg total) by mouth daily.  Obsessive-compulsive disorder, unspecified type -     sertraline (ZOLOFT) 100 MG tablet; Take 1 tablet (100 mg total) by mouth daily.  Panic attacks -     ALPRAZolam (XANAX) 1 MG tablet; Take one tablet three times daily as needed for anxiety. -     sertraline (ZOLOFT) 100 MG tablet; Take 1 tablet (100 mg total) by mouth daily.     Please see After Visit Summary for patient specific instructions.  Future Appointments  Date Time Provider Department Center   03/05/2023  8:15 AM [facility] PHLEBOTOMIST [facility] None  03/05/2023  8:30 AM [facility] FLUSH ROOM [facility] None  03/05/2023  9:00 AM Ladene Artist, MD [facility] None  03/05/2023 10:00 AM [facility] CHAIR 2 [facility] None  03/07/2023  1:30 PM [facility] FLUSH ROOM [facility] None  03/19/2023 10:30 AM [facility] PHLEBOTOMIST [facility] None  03/19/2023 10:45 AM [facility] FLUSH ROOM [facility] None  03/19/2023 11:15 AM Rana Snare, NP [facility] None  03/19/2023 12:00 PM [facility] CHAIR 2 [facility] None  03/21/2023  1:00 PM [facility] FLUSH ROOM [facility] None    No orders of the defined types were placed in this encounter.     -------------------------------

## 2023-02-22 NOTE — Progress Notes (Signed)
[facility] CSW Progress Note  Clinical Social Worker returned patient's call regarding his mother, who is a patient of Dr. Gustavo Lah.  He asked about an outstanding medical bill she has.  Provided information regarding various [place] assistance programs and he stated he understood.  He will follow up with the Billing Department.    Dorothey Baseman, LCSW Clinical Social Worker [facility]    Patient is participating in a Managed Medicaid Plan:  Yes

## 2023-02-26 ENCOUNTER — Encounter: Payer: Self-pay | Admitting: Nurse Practitioner

## 2023-02-26 ENCOUNTER — Encounter: Payer: Self-pay | Admitting: Oncology

## 2023-03-04 ENCOUNTER — Other Ambulatory Visit: Payer: Self-pay | Admitting: Oncology

## 2023-03-04 DIAGNOSIS — C787 Secondary malignant neoplasm of liver and intrahepatic bile duct: Secondary | ICD-10-CM

## 2023-03-05 ENCOUNTER — Inpatient Hospital Stay: Payer: Medicare HMO

## 2023-03-05 ENCOUNTER — Encounter: Payer: Self-pay | Admitting: Oncology

## 2023-03-05 ENCOUNTER — Other Ambulatory Visit: Payer: Medicaid Other

## 2023-03-05 ENCOUNTER — Encounter: Payer: Self-pay | Admitting: *Deleted

## 2023-03-05 ENCOUNTER — Encounter: Payer: Self-pay | Admitting: Nurse Practitioner

## 2023-03-05 ENCOUNTER — Inpatient Hospital Stay: Payer: Medicare HMO | Attending: Oncology | Admitting: Oncology

## 2023-03-05 ENCOUNTER — Ambulatory Visit: Payer: Medicaid Other

## 2023-03-05 VITALS — BP 118/66 | HR 79 | Temp 98.1°F | Resp 18 | Ht 66.0 in | Wt 240.0 lb

## 2023-03-05 VITALS — BP 120/67 | HR 79 | Resp 18

## 2023-03-05 DIAGNOSIS — F32A Depression, unspecified: Secondary | ICD-10-CM | POA: Diagnosis not present

## 2023-03-05 DIAGNOSIS — C189 Malignant neoplasm of colon, unspecified: Secondary | ICD-10-CM | POA: Diagnosis not present

## 2023-03-05 DIAGNOSIS — C182 Malignant neoplasm of ascending colon: Secondary | ICD-10-CM | POA: Insufficient documentation

## 2023-03-05 DIAGNOSIS — Z5111 Encounter for antineoplastic chemotherapy: Secondary | ICD-10-CM | POA: Insufficient documentation

## 2023-03-05 DIAGNOSIS — K219 Gastro-esophageal reflux disease without esophagitis: Secondary | ICD-10-CM | POA: Diagnosis not present

## 2023-03-05 DIAGNOSIS — Z79899 Other long term (current) drug therapy: Secondary | ICD-10-CM | POA: Insufficient documentation

## 2023-03-05 DIAGNOSIS — C787 Secondary malignant neoplasm of liver and intrahepatic bile duct: Secondary | ICD-10-CM

## 2023-03-05 DIAGNOSIS — Z800 Family history of malignant neoplasm of digestive organs: Secondary | ICD-10-CM | POA: Insufficient documentation

## 2023-03-05 DIAGNOSIS — I10 Essential (primary) hypertension: Secondary | ICD-10-CM | POA: Diagnosis not present

## 2023-03-05 DIAGNOSIS — E785 Hyperlipidemia, unspecified: Secondary | ICD-10-CM | POA: Diagnosis not present

## 2023-03-05 DIAGNOSIS — D509 Iron deficiency anemia, unspecified: Secondary | ICD-10-CM | POA: Insufficient documentation

## 2023-03-05 DIAGNOSIS — F410 Panic disorder [episodic paroxysmal anxiety] without agoraphobia: Secondary | ICD-10-CM | POA: Diagnosis not present

## 2023-03-05 LAB — CEA (ACCESS): CEA (CHCC): 83.24 ng/mL — ABNORMAL HIGH (ref 0.00–5.00)

## 2023-03-05 LAB — CMP (CANCER CENTER ONLY)
ALT: 37 U/L (ref 0–44)
AST: 41 U/L (ref 15–41)
Albumin: 3.7 g/dL (ref 3.5–5.0)
Alkaline Phosphatase: 174 U/L — ABNORMAL HIGH (ref 38–126)
Anion gap: 7 (ref 5–15)
BUN: 11 mg/dL (ref 6–20)
CO2: 27 mmol/L (ref 22–32)
Calcium: 8.5 mg/dL — ABNORMAL LOW (ref 8.9–10.3)
Chloride: 103 mmol/L (ref 98–111)
Creatinine: 0.65 mg/dL (ref 0.61–1.24)
GFR, Estimated: >60 mL/min (ref >60)
Glucose, Bld: 284 mg/dL — ABNORMAL HIGH (ref 70–99)
Potassium: 3.7 mmol/L (ref 3.5–5.1)
Sodium: 137 mmol/L (ref 135–145)
Total Bilirubin: 0.9 mg/dL (ref 0.3–1.2)
Total Protein: 6.7 g/dL (ref 6.5–8.1)

## 2023-03-05 LAB — CBC WITH DIFFERENTIAL (CANCER CENTER ONLY)
Abs Immature Granulocytes: 0.01 10*3/uL (ref 0.00–0.07)
Basophils Absolute: 0 10*3/uL (ref 0.0–0.1)
Basophils Relative: 1 %
Eosinophils Absolute: 0.1 10*3/uL (ref 0.0–0.5)
Eosinophils Relative: 5 %
HCT: 33.8 % — ABNORMAL LOW (ref 39.0–52.0)
Hemoglobin: 10.2 g/dL — ABNORMAL LOW (ref 13.0–17.0)
Immature Granulocytes: 0 %
Lymphocytes Relative: 23 %
Lymphs Abs: 0.7 10*3/uL (ref 0.7–4.0)
MCH: 24.4 pg — ABNORMAL LOW (ref 26.0–34.0)
MCHC: 30.2 g/dL (ref 30.0–36.0)
MCV: 80.9 fL (ref 80.0–100.0)
Monocytes Absolute: 0.3 10*3/uL (ref 0.1–1.0)
Monocytes Relative: 9 %
Neutro Abs: 1.9 10*3/uL (ref 1.7–7.7)
Neutrophils Relative %: 62 %
Platelet Count: 162 10*3/uL (ref 150–400)
RBC: 4.18 MIL/uL — ABNORMAL LOW (ref 4.22–5.81)
RDW: 16.4 % — ABNORMAL HIGH (ref 11.5–15.5)
WBC Count: 3 10*3/uL — ABNORMAL LOW (ref 4.0–10.5)
nRBC: 0 % (ref 0.0–0.2)

## 2023-03-05 MED ORDER — ATROPINE SULFATE 1 MG/ML IV SOLN
0.5000 mg | Freq: Once | INTRAVENOUS | Status: AC | PRN
  Administered 2023-03-05: 0.5 mg via INTRAVENOUS
  Filled 2023-03-05: qty 1

## 2023-03-05 MED ORDER — SODIUM CHLORIDE 0.9 % IV SOLN
1600.0000 mg/m2 | INTRAVENOUS | Status: DC
  Administered 2023-03-05: 3500 mg via INTRAVENOUS
  Filled 2023-03-05: qty 70

## 2023-03-05 MED ORDER — PALONOSETRON HCL INJECTION 0.25 MG/5ML
0.2500 mg | Freq: Once | INTRAVENOUS | Status: AC
  Administered 2023-03-05: 0.25 mg via INTRAVENOUS
  Filled 2023-03-05: qty 5

## 2023-03-05 MED ORDER — SODIUM CHLORIDE 0.9 % IV SOLN
5.0000 mg/kg | Freq: Once | INTRAVENOUS | Status: AC
  Administered 2023-03-05: 500 mg via INTRAVENOUS
  Filled 2023-03-05: qty 16

## 2023-03-05 MED ORDER — SODIUM CHLORIDE 0.9 % IV SOLN
180.0000 mg/m2 | Freq: Once | INTRAVENOUS | Status: AC
  Administered 2023-03-05: 400 mg via INTRAVENOUS
  Filled 2023-03-05: qty 15

## 2023-03-05 MED ORDER — SODIUM CHLORIDE 0.9 % IV SOLN
10.0000 mg | Freq: Once | INTRAVENOUS | Status: AC
  Administered 2023-03-05: 10 mg via INTRAVENOUS
  Filled 2023-03-05: qty 10

## 2023-03-05 MED ORDER — FLUOROURACIL CHEMO INJECTION 500 MG/10ML
200.0000 mg/m2 | Freq: Once | INTRAVENOUS | Status: AC
  Administered 2023-03-05: 450 mg via INTRAVENOUS
  Filled 2023-03-05: qty 9

## 2023-03-05 MED ORDER — SODIUM CHLORIDE 0.9 % IV SOLN: Freq: Once | INTRAVENOUS | Status: AC

## 2023-03-05 MED ORDER — SODIUM CHLORIDE 0.9 % IV SOLN
200.0000 mg/m2 | Freq: Once | INTRAVENOUS | Status: AC
  Administered 2023-03-05: 444 mg via INTRAVENOUS
  Filled 2023-03-05: qty 22.2

## 2023-03-05 NOTE — Progress Notes (Addendum)
Patient seen by Dr. Truett Perna today  Vitals are within treatment parameters.No intervention for BP 142/80  Labs reviewed by Dr. Truett Perna and are within treatment parameters.No intervention needed for glucose 284. OK to use urine protein of 6/24 for Avastin today.  Per physician team, patient is ready for treatment. Please note that modifications are being made to the treatment plan including MD has added bevacizumab back to regimen.

## 2023-03-05 NOTE — Progress Notes (Signed)
[facility] OFFICE PROGRESS NOTE   Diagnosis: Colon cancer  INTERVAL HISTORY:   Anthony Calhoun completed a cycle of FOLFIRI on 02/19/2023.  He developed hypersalivation during the irinotecan treatment.  He reports this was relieved with atropine.  He had 2 episodes of diarrhea on the day following chemotherapy.  Good appetite.  His teeth were pulled 2 weeks ago.  The fracture areas have healed.  Objective:  Vital signs in last 24 hours:  Blood pressure 118/66, pulse 79, temperature 98.1 F (36.7 C), temperature source Oral, resp. rate 18, height 5\' 6"  (1.676 m), weight 240 lb (108.9 kg), SpO2 99 %.    HEENT: No thrush or ulcers.  Dental extraction sites without bleeding or signs of infection. Resp: Lungs clear bilaterally Cardio: Regular rate and rhythm GI: No hepatosplenomegaly, mild tenderness in the right upper abdomen, no mass Vascular: No leg edema  Portacath/PICC-without erythema  Lab Results:  Lab Results  Component Value Date   WBC 3.0 (L) 03/05/2023   HGB 10.2 (L) 03/05/2023   HCT 33.8 (L) 03/05/2023   MCV 80.9 03/05/2023   PLT 162 03/05/2023   NEUTROABS 1.9 03/05/2023    CMP  Lab Results  Component Value Date   NA 137 02/19/2023   K 3.9 02/19/2023   CL 104 02/19/2023   CO2 27 02/19/2023   GLUCOSE 144 (H) 02/19/2023   BUN 7 02/19/2023   CREATININE 0.65 02/19/2023   CALCIUM 8.9 02/19/2023   PROT 7.6 02/19/2023   ALBUMIN 3.7 02/19/2023   AST 50 (H) 02/19/2023   ALT 47 (H) 02/19/2023   ALKPHOS 205 (H) 02/19/2023   BILITOT 0.9 02/19/2023   GFRNONAA >60 02/19/2023   GFRAA >60 05/10/2020    Lab Results  Component Value Date   CEA1 1.08 03/07/2021   CEA 75.20 (H) 02/19/2023     Medications: I have reviewed the patient's current medications.   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 secondary to nausea (patient discontinued Xeloda on day 11 due to colonoscopy) Colonoscopy 07/09/2020-nodular mucosa at the colonic anastomosis, biopsied; patent end-to-side ileocolonic anastomosis characterized by healthy-appearing mucosa and visible sutures; nonbleeding internal hemorrhoids, biopsy with benign ulcerated anastomotic mucosa with granulation tissue Cycle 4 adjuvant Xeloda 07/19/2020, 2000 mg every morning and 1500 mg every afternoon Cycle 5 adjuvant Xeloda 08/09/2020 Cycle 6 adjuvant Xeloda 08/31/2019 Cycle 7 adjuvant Xeloda 09/20/2020 Cycle 8 adjuvant Xeloda 10/11/2020 CTs 03/07/2021-no evidence of recurrent disease, hepatic steatosis, slight wall thickening at the junction of the descending and horizontal duodenum Mild elevation of CEA 2023 12/01/2021-Guardant Reveal-ct DNA detected PET 12/30/2021-hypermetabolic right liver lesion, hypermetabolic area in a loop of mid small bowel with an SUV of 12.5, review of PET images at GI tumor conference 01/11/2022-small bowel uptake felt to be a benign finding MRI liver 01/10/2022-solitary 1.2 cm right liver lesion between segments 7 and 8 compatible with metastasis, subtle changes suggesting cirrhosis, hepatic steatosis CT enteroscopy 02/02/2022-small bowel loop with hypermetabolic activity on PET continues to have a bandlike density along the margin felt to represent a diverticulum or adjacent nodal tissue.  Small bowel  tumor not excluded.  3-4 mm left omental nodule 02/08/2022-biopsy and ablation of solitary liver lesion, adenocarcinoma consistent with metastatic colorectal adenocarcinoma CTs 03/31/2022-unchanged  circumstantial soft tissue thickening surrounding a loop of mid to distal small bowel with an adjacent nodular focus measuring 1.2 cm.,  Ablation cavity in segment 7, no other evidence of metastatic disease Diagnostic laparoscopy 05/26/2022-mid small bowel mass-resected, well to moderately differentiated adenocarcinoma, 0/2 lymph nodes, morphology consistent with a colon primary, tumor involved full-thickness including serosa with perineural and large vessel involvement; foundation 1-microsatellite stable, tumor mutation burden 2, K-ras Q61H CT abdomen/pelvis 07/10/2022-the right liver ablation defect is smaller, new subtle right liver lesion measuring 13 x 9 mm, enlarged lymph node in the right upper quadrant mesentery adjacent to surgical anastomosis PET 07/26/2022-new segment 6 liver lesion, enlarging hypermetabolic left omental lesion, there is another mildly hypermetabolic peritoneal implant, 7 mm omental nodule at the midline without hypermetabolism Cycle 1 FOLFOX 09/25/2022 Cycle 2 FOLFOX/bevacizumab 10/09/2022, 5-FU dose reduced secondary to mucositis Cycle 3 FOLFOX/bevacizumab 10/23/2022 Cycle 4 FOLFOX/bevacizumab 11/06/2022 Cycle 5 FOLFOX/bevacizumab 11/20/2022 Cycle 6 FOLFOX/bevacizumab 12/04/2022 CT abdomen/pelvis 12/14/2022-further contraction of the treated segment 7 lesion, slight enlargement of segment 6 lesion, no new liver lesion, increased size of ileocolic nodes and an omental lesion, stable small retroperitoneal nodes Cycle 7 FOLFOX/bevacizumab 12/18/2022 Cycle 8 FOLFOX/bevacizumab 01/02/2023, oxaliplatin dose reduced secondary to prolonged nausea and neuropathy symptoms, Emend added Cycle 9 FOLFOX/bevacizumab 01/16/2023, oxaliplatin discontinued secondary to neuropathy Cycle 1 FOLFIRI 02/19/2023, Avastin held due to recent dental extractions Cycle 2 FOLFIRI/Avastin 03/05/2023 Microcytic anemia secondary to #1-improved Colon polyps-sessile serrated adenoma of the descending colon, tubular  adenomas with focal high-grade dysplasia of the transverse colon on colonoscopy 02/12/2020, tubular adenoma of the mid ascending colon on the surgical specimen 04/12/2020 Family history of colon cancer Depression Anxiety/panic attacks Hypertension Hyperlipidemia Nausea secondary to Xeloda?-Resolved Gastroesophageal reflux disease-improved following discontinuation of Xeloda Anemia, microcytic; ferritin 6 01/24/2022; stool positive for blood x3 01/25/2022; 02/02/2022 CT abdomen/pelvis enterography-bowel loop demonstrating hypermetabolic activity continues to have a bandlike density along its margin possibly representing a diverticulum or adjacent nodal tissue, difficult to exclude small bowel tumor, 3 x 4 mm left omental nodule or lymph node, known right hepatic lobe tumor is occult on CT. Venofer weekly x3 beginning 03/03/2022 Negative capsule endoscopy 03/09/2022 Venofer 03/03/2022 for 3 weekly doses Venofer 05/03/2022, 05/19/2022 Venofer 07/28/2022, 08/04/2022 12.  Anterior left neck nodule 03/17/2022-cyst?,  Lymph node? 13.  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  Oxaliplatin neuropathy-mild loss of vibratory sense 12/04/2022, 12/18/2022, 01/02/2023    Disposition: Anthony Calhoun completed a cycle of FOLFIRI 02/19/2023.  He tolerated the treatment well.  He will complete cycle 2 today.  Avastin will be added with cycle 2.  The white count is mildly low today.  We will request approval for G-CSF beginning with cycle 3.  Anthony Calhoun will return for an office visit and chemotherapy in 2 weeks.  Thornton Papas, MD  03/05/2023  9:09 AM

## 2023-03-05 NOTE — Patient Instructions (Signed)
[facility]   Discharge Instructions: Thank you for choosing [facility] to provide your oncology and hematology care.   If you have a lab appointment with the Cancer Center, please go directly to the Cancer Center and check in at the registration area.   Wear comfortable clothing and clothing appropriate for easy access to any Portacath or PICC line.   We strive to give you quality time with your provider. You may need to reschedule your appointment if you arrive late (15 or more minutes).  Arriving late affects you and other patients whose appointments are after yours.  Also, if you miss three or more appointments without notifying the office, you may be dismissed from the clinic at the provider's discretion.      For prescription refill requests, have your pharmacy contact our office and allow 72 hours for refills to be completed.    Today you received the following chemotherapy and/or immunotherapy agents Bevacizumab-adcd (VEGZELMA), Irinotecan (CAMPTOSAR), Leucovorin & Flourouracil (ADRUCIL).      To help prevent nausea and vomiting after your treatment, we encourage you to take your nausea medication as directed.  BELOW ARE SYMPTOMS THAT SHOULD BE REPORTED IMMEDIATELY: *FEVER GREATER THAN 100.4 F (38 C) OR HIGHER *CHILLS OR SWEATING *NAUSEA AND VOMITING THAT IS NOT CONTROLLED WITH YOUR NAUSEA MEDICATION *UNUSUAL SHORTNESS OF BREATH *UNUSUAL BRUISING OR BLEEDING *URINARY PROBLEMS (pain or burning when urinating, or frequent urination) *BOWEL PROBLEMS (unusual diarrhea, constipation, pain near the anus) TENDERNESS IN MOUTH AND THROAT WITH OR WITHOUT PRESENCE OF ULCERS (sore throat, sores in mouth, or a toothache) UNUSUAL RASH, SWELLING OR PAIN  UNUSUAL VAGINAL DISCHARGE OR ITCHING   Items with * indicate a potential emergency and should be followed up as soon as possible or go to the Emergency Department if any problems should  occur.  Please show the CHEMOTHERAPY ALERT CARD or IMMUNOTHERAPY ALERT CARD at check-in to the Emergency Department and triage nurse.  Should you have questions after your visit or need to cancel or reschedule your appointment, please contact [facility]  Dept: [phone]  and follow the prompts.  Office hours are 8:00 a.m. to 4:30 p.m. Monday - Friday. Please note that voicemails left after 4:00 p.m. may not be returned until the following business day.  We are closed weekends and major holidays. You have access to a nurse at all times for urgent questions. Please call the main number to the clinic Dept: [phone] and follow the prompts.   For any non-urgent questions, you may also contact your provider using MyChart. We now offer e-Visits for anyone 89 and older to request care online for non-urgent symptoms. For details visit [email or web address].   Also download the MyChart app! Go to the app store, search "MyChart", open the app, select [facility], and log in with your MyChart username and password.  Bevacizumab Injection What is this medication? BEVACIZUMAB (be va SIZ yoo mab) treats some types of cancer. It works by blocking a protein that causes cancer cells to grow and multiply. This helps to slow or stop the spread of cancer cells. It is a monoclonal antibody. This medicine may be used for other purposes; ask your health care provider or pharmacist if you have questions. COMMON BRAND NAME(S): Alymsys, Avastin, MVASI, Omer Jack What should I tell my care team before I take this medication? They need to know if you have any of these conditions: Blood clots Coughing up  blood Having or recent surgery Heart failure High blood pressure History of a connection between 2 or more body parts that do not usually connect (fistula) History of a tear in your stomach or intestines Protein in your urine An unusual or allergic reaction to bevacizumab,  other medications, foods, dyes, or preservatives Pregnant or trying to get pregnant Breast-feeding How should I use this medication? This medication is injected into a vein. It is given by your care team in a hospital or clinic setting. Talk to your care team the use of this medication in children. Special care may be needed. Overdosage: If you think you have taken too much of this medicine contact a poison control center or emergency room at once. NOTE: This medicine is only for you. Do not share this medicine with others. What if I miss a dose? Keep appointments for follow-up doses. It is important not to miss your dose. Call your care team if you are unable to keep an appointment. What may interact with this medication? Interactions are not expected. This list may not describe all possible interactions. Give your health care provider a list of all the medicines, herbs, non-prescription drugs, or dietary supplements you use. Also tell them if you smoke, drink alcohol, or use illegal drugs. Some items may interact with your medicine. What should I watch for while using this medication? Your condition will be monitored carefully while you are receiving this medication. You may need blood work while taking this medication. This medication may make you feel generally unwell. This is not uncommon as chemotherapy can affect healthy cells as well as cancer cells. Report any side effects. Continue your course of treatment even though you feel ill unless your care team tells you to stop. This medication may increase your risk to bruise or bleed. Call your care team if you notice any unusual bleeding. Before having surgery, talk to your care team to make sure it is ok. This medication can increase the risk of poor healing of your surgical site or wound. You will need to stop this medication for 28 days before surgery. After surgery, wait at least 28 days before restarting this medication. Make sure the  surgical site or wound is healed enough before restarting this medication. Talk to your care team if questions. Talk to your care team if you may be pregnant. Serious birth defects can occur if you take this medication during pregnancy and for 6 months after the last dose. Contraception is recommended while taking this medication and for 6 months after the last dose. Your care team can help you find the option that works for you. Do not breastfeed while taking this medication and for 6 months after the last dose. This medication can cause infertility. Talk to your care team if you are concerned about your fertility. What side effects may I notice from receiving this medication? Side effects that you should report to your care team as soon as possible: Allergic reactions--skin rash, itching, hives, swelling of the face, lips, tongue, or throat Bleeding--bloody or black, tar-like stools, vomiting blood or brown material that looks like coffee grounds, red or dark brown urine, small red or purple spots on skin, unusual bruising or bleeding Blood clot--pain, swelling, or warmth in the leg, shortness of breath, chest pain Heart attack--pain or tightness in the chest, shoulders, arms, or jaw, nausea, shortness of breath, cold or clammy skin, feeling faint or lightheaded Heart failure--shortness of breath, swelling of the ankles, feet, or hands,  sudden weight gain, unusual weakness or fatigue Increase in blood pressure Infection--fever, chills, cough, sore throat, wounds that don't heal, pain or trouble when passing urine, general feeling of discomfort or being unwell Infusion reactions--chest pain, shortness of breath or trouble breathing, feeling faint or lightheaded Kidney injury--decrease in the amount of urine, swelling of the ankles, hands, or feet Stomach pain that is severe, does not go away, or gets worse Stroke--sudden numbness or weakness of the face, arm, or leg, trouble speaking, confusion,  trouble walking, loss of balance or coordination, dizziness, severe headache, change in vision Sudden and severe headache, confusion, change in vision, seizures, which may be signs of posterior reversible encephalopathy syndrome (PRES) Side effects that usually do not require medical attention (report to your care team if they continue or are bothersome): Back pain Change in taste Diarrhea Dry skin Increased tears Nosebleed This list may not describe all possible side effects. Call your doctor for medical advice about side effects. You may report side effects to FDA at 1-800-FDA-1088. Where should I keep my medication? This medication is given in a hospital or clinic. It will not be stored at home. NOTE: This sheet is a summary. It may not cover all possible information. If you have questions about this medicine, talk to your doctor, pharmacist, or health care provider.  2024 Elsevier/Gold Standard (2021-12-30 00:00:00)  Irinotecan Injection What is this medication? IRINOTECAN (ir in oh TEE kan) treats some types of cancer. It works by slowing down the growth of cancer cells. This medicine may be used for other purposes; ask your health care provider or pharmacist if you have questions. COMMON BRAND NAME(S): Camptosar What should I tell my care team before I take this medication? They need to know if you have any of these conditions: Dehydration Diarrhea Infection, especially a viral infection, such as chickenpox, cold sores, herpes Liver disease Low blood cell levels (white cells, red cells, and platelets) Low levels of electrolytes, such as calcium, magnesium, or potassium in your blood Recent or ongoing radiation An unusual or allergic reaction to irinotecan, other medications, foods, dyes, or preservatives If you or your partner are pregnant or trying to get pregnant Breast-feeding How should I use this medication? This medication is injected into a vein. It is given by your  care team in a hospital or clinic setting. Talk to your care team about the use of this medication in children. Special care may be needed. Overdosage: If you think you have taken too much of this medicine contact a poison control center or emergency room at once. NOTE: This medicine is only for you. Do not share this medicine with others. What if I miss a dose? Keep appointments for follow-up doses. It is important not to miss your dose. Call your care team if you are unable to keep an appointment. What may interact with this medication? Do not take this medication with any of the following: Cobicistat Itraconazole This medication may also interact with the following: Certain antibiotics, such as clarithromycin, rifampin, rifabutin Certain antivirals for HIV or AIDS Certain medications for fungal infections, such as ketoconazole, posaconazole, voriconazole Certain medications for seizures, such as carbamazepine, phenobarbital, phenytoin Gemfibrozil Nefazodone St. John's wort This list may not describe all possible interactions. Give your health care provider a list of all the medicines, herbs, non-prescription drugs, or dietary supplements you use. Also tell them if you smoke, drink alcohol, or use illegal drugs. Some items may interact with your medicine. What should I watch  for while using this medication? Your condition will be monitored carefully while you are receiving this medication. You may need blood work while taking this medication. This medication may make you feel generally unwell. This is not uncommon as chemotherapy can affect healthy cells as well as cancer cells. Report any side effects. Continue your course of treatment even though you feel ill unless your care team tells you to stop. This medication can cause serious side effects. To reduce the risk, your care team may give you other medications to take before receiving this one. Be sure to follow the directions from your  care team. This medication may affect your coordination, reaction time, or judgement. Do not drive or operate machinery until you know how this medication affects you. Sit up or stand slowly to reduce the risk of dizzy or fainting spells. Drinking alcohol with this medication can increase the risk of these side effects. This medication may increase your risk of getting an infection. Call your care team for advice if you get a fever, chills, sore throat, or other symptoms of a cold or flu. Do not treat yourself. Try to avoid being around people who are sick. Avoid taking medications that contain aspirin, acetaminophen, ibuprofen, naproxen, or ketoprofen unless instructed by your care team. These medications may hide a fever. This medication may increase your risk to bruise or bleed. Call your care team if you notice any unusual bleeding. Be careful brushing or flossing your teeth or using a toothpick because you may get an infection or bleed more easily. If you have any dental work done, tell your dentist you are receiving this medication. Talk to your care team if you or your partner are pregnant or think either of you might be pregnant. This medication can cause serious birth defects if taken during pregnancy and for 6 months after the last dose. You will need a negative pregnancy test before starting this medication. Contraception is recommended while taking this medication and for 6 months after the last dose. Your care team can help you find the option that works for you. Do not father a child while taking this medication and for 3 months after the last dose. Use a condom for contraception during this time period. Do not breastfeed while taking this medication and for 7 days after the last dose. This medication may cause infertility. Talk to your care team if you are concerned about your fertility. What side effects may I notice from receiving this medication? Side effects that you should report to  your care team as soon as possible: Allergic reactions--skin rash, itching, hives, swelling of the face, lips, tongue, or throat Dry cough, shortness of breath or trouble breathing Increased saliva or tears, increased sweating, stomach cramping, diarrhea, small pupils, unusual weakness or fatigue, slow heartbeat Infection--fever, chills, cough, sore throat, wounds that don't heal, pain or trouble when passing urine, general feeling of discomfort or being unwell Kidney injury--decrease in the amount of urine, swelling of the ankles, hands, or feet Low red blood cell level--unusual weakness or fatigue, dizziness, headache, trouble breathing Severe or prolonged diarrhea Unusual bruising or bleeding Side effects that usually do not require medical attention (report to your care team if they continue or are bothersome): Constipation Diarrhea Hair loss Loss of appetite Nausea Stomach pain This list may not describe all possible side effects. Call your doctor for medical advice about side effects. You may report side effects to FDA at 1-800-FDA-1088. Where should I keep my medication?  This medication is given in a hospital or clinic. It will not be stored at home. NOTE: This sheet is a summary. It may not cover all possible information. If you have questions about this medicine, talk to your doctor, pharmacist, or health care provider.  2024 Elsevier/Gold Standard (2021-12-26 00:00:00)  Leucovorin Injection What is this medication? LEUCOVORIN (loo koe VOR in) prevents side effects from certain medications, such as methotrexate. It works by increasing folate levels. This helps protect healthy cells in your body. It may also be used to treat anemia caused by low levels of folate. It can also be used with fluorouracil, a type of chemotherapy, to treat colorectal cancer. It works by increasing the effects of fluorouracil in the body. This medicine may be used for other purposes; ask your health care  provider or pharmacist if you have questions. What should I tell my care team before I take this medication? They need to know if you have any of these conditions: Anemia from low levels of vitamin B12 in the blood An unusual or allergic reaction to leucovorin, folic acid, other medications, foods, dyes, or preservatives Pregnant or trying to get pregnant Breastfeeding How should I use this medication? This medication is injected into a vein or a muscle. It is given by your care team in a hospital or clinic setting. Talk to your care team about the use of this medication in children. Special care may be needed. Overdosage: If you think you have taken too much of this medicine contact a poison control center or emergency room at once. NOTE: This medicine is only for you. Do not share this medicine with others. What if I miss a dose? Keep appointments for follow-up doses. It is important not to miss your dose. Call your care team if you are unable to keep an appointment. What may interact with this medication? Capecitabine Fluorouracil Phenobarbital Phenytoin Primidone Trimethoprim;sulfamethoxazole This list may not describe all possible interactions. Give your health care provider a list of all the medicines, herbs, non-prescription drugs, or dietary supplements you use. Also tell them if you smoke, drink alcohol, or use illegal drugs. Some items may interact with your medicine. What should I watch for while using this medication? Your condition will be monitored carefully while you are receiving this medication. This medication may increase the side effects of 5-fluorouracil. Tell your care team if you have diarrhea or mouth sores that do not get better or that get worse. What side effects may I notice from receiving this medication? Side effects that you should report to your care team as soon as possible: Allergic reactions--skin rash, itching, hives, swelling of the face, lips, tongue,  or throat This list may not describe all possible side effects. Call your doctor for medical advice about side effects. You may report side effects to FDA at 1-800-FDA-1088. Where should I keep my medication? This medication is given in a hospital or clinic. It will not be stored at home. NOTE: This sheet is a summary. It may not cover all possible information. If you have questions about this medicine, talk to your doctor, pharmacist, or health care provider.  2024 Elsevier/Gold Standard (2022-01-17 00:00:00)  Fluorouracil Injection What is this medication? FLUOROURACIL (flure oh YOOR a sil) treats some types of cancer. It works by slowing down the growth of cancer cells. This medicine may be used for other purposes; ask your health care provider or pharmacist if you have questions. COMMON BRAND NAME(S): Adrucil What should I tell  my care team before I take this medication? They need to know if you have any of these conditions: Blood disorders Dihydropyrimidine dehydrogenase (DPD) deficiency Infection, such as chickenpox, cold sores, herpes Kidney disease Liver disease Poor nutrition Recent or ongoing radiation therapy An unusual or allergic reaction to fluorouracil, other medications, foods, dyes, or preservatives If you or your partner are pregnant or trying to get pregnant Breast-feeding How should I use this medication? This medication is injected into a vein. It is administered by your care team in a hospital or clinic setting. Talk to your care team about the use of this medication in children. Special care may be needed. Overdosage: If you think you have taken too much of this medicine contact a poison control center or emergency room at once. NOTE: This medicine is only for you. Do not share this medicine with others. What if I miss a dose? Keep appointments for follow-up doses. It is important not to miss your dose. Call your care team if you are unable to keep an  appointment. What may interact with this medication? Do not take this medication with any of the following: Live virus vaccines This medication may also interact with the following: Medications that treat or prevent blood clots, such as warfarin, enoxaparin, dalteparin This list may not describe all possible interactions. Give your health care provider a list of all the medicines, herbs, non-prescription drugs, or dietary supplements you use. Also tell them if you smoke, drink alcohol, or use illegal drugs. Some items may interact with your medicine. What should I watch for while using this medication? Your condition will be monitored carefully while you are receiving this medication. This medication may make you feel generally unwell. This is not uncommon as chemotherapy can affect healthy cells as well as cancer cells. Report any side effects. Continue your course of treatment even though you feel ill unless your care team tells you to stop. In some cases, you may be given additional medications to help with side effects. Follow all directions for their use. This medication may increase your risk of getting an infection. Call your care team for advice if you get a fever, chills, sore throat, or other symptoms of a cold or flu. Do not treat yourself. Try to avoid being around people who are sick. This medication may increase your risk to bruise or bleed. Call your care team if you notice any unusual bleeding. Be careful brushing or flossing your teeth or using a toothpick because you may get an infection or bleed more easily. If you have any dental work done, tell your dentist you are receiving this medication. Avoid taking medications that contain aspirin, acetaminophen, ibuprofen, naproxen, or ketoprofen unless instructed by your care team. These medications may hide a fever. Do not treat diarrhea with over the counter products. Contact your care team if you have diarrhea that lasts more than 2  days or if it is severe and watery. This medication can make you more sensitive to the sun. Keep out of the sun. If you cannot avoid being in the sun, wear protective clothing and sunscreen. Do not use sun lamps, tanning beds, or tanning booths. Talk to your care team if you or your partner wish to become pregnant or think you might be pregnant. This medication can cause serious birth defects if taken during pregnancy and for 3 months after the last dose. A reliable form of contraception is recommended while taking this medication and for 3 months after  the last dose. Talk to your care team about effective forms of contraception. Do not father a child while taking this medication and for 3 months after the last dose. Use a condom while having sex during this time period. Do not breastfeed while taking this medication. This medication may cause infertility. Talk to your care team if you are concerned about your fertility. What side effects may I notice from receiving this medication? Side effects that you should report to your care team as soon as possible: Allergic reactions--skin rash, itching, hives, swelling of the face, lips, tongue, or throat Heart attack--pain or tightness in the chest, shoulders, arms, or jaw, nausea, shortness of breath, cold or clammy skin, feeling faint or lightheaded Heart failure--shortness of breath, swelling of the ankles, feet, or hands, sudden weight gain, unusual weakness or fatigue Heart rhythm changes--fast or irregular heartbeat, dizziness, feeling faint or lightheaded, chest pain, trouble breathing High ammonia level--unusual weakness or fatigue, confusion, loss of appetite, nausea, vomiting, seizures Infection--fever, chills, cough, sore throat, wounds that don't heal, pain or trouble when passing urine, general feeling of discomfort or being unwell Low red blood cell level--unusual weakness or fatigue, dizziness, headache, trouble breathing Pain, tingling, or  numbness in the hands or feet, muscle weakness, change in vision, confusion or trouble speaking, loss of balance or coordination, trouble walking, seizures Redness, swelling, and blistering of the skin over hands and feet Severe or prolonged diarrhea Unusual bruising or bleeding Side effects that usually do not require medical attention (report to your care team if they continue or are bothersome): Dry skin Headache Increased tears Nausea Pain, redness, or swelling with sores inside the mouth or throat Sensitivity to light Vomiting This list may not describe all possible side effects. Call your doctor for medical advice about side effects. You may report side effects to FDA at 1-800-FDA-1088. Where should I keep my medication? This medication is given in a hospital or clinic. It will not be stored at home. NOTE: This sheet is a summary. It may not cover all possible information. If you have questions about this medicine, talk to your doctor, pharmacist, or health care provider.  2024 [employer] (2021-12-20 00:00:00)  The chemotherapy medication bag should finish at 46 hours, 96 hours, or 7 days. For example, if your pump is scheduled for 46 hours and it was put on at 4:00 p.m., it should finish at 2:00 p.m. the day it is scheduled to come off regardless of your appointment time.     Estimated time to finish at 11:30 a.m. on Wednesday 03/07/2023.   If the display on your pump reads "Low Volume" and it is beeping, take the batteries out of the pump and come to the cancer center for it to be taken off.   If the pump alarms go off prior to the pump reading "Low Volume" then call [phone] and someone can assist you.  If the plunger comes out and the chemotherapy medication is leaking out, please use your home chemo spill kit to clean up the spill. Do NOT use paper towels or other household products.  If you have problems or questions regarding your pump, please call either  [phone] (24 hours a day) or the cancer center Monday-Friday 8:00 a.m.- 4:30 p.m. at the clinic number and we will assist you. If you are unable to get assistance, then go to the nearest Emergency Department and ask the staff to contact the IV team for assistance.

## 2023-03-06 ENCOUNTER — Other Ambulatory Visit: Payer: Self-pay

## 2023-03-07 ENCOUNTER — Inpatient Hospital Stay: Payer: Medicare HMO

## 2023-03-07 VITALS — BP 105/63 | HR 71 | Temp 98.2°F | Resp 18

## 2023-03-07 DIAGNOSIS — D509 Iron deficiency anemia, unspecified: Secondary | ICD-10-CM | POA: Diagnosis not present

## 2023-03-07 DIAGNOSIS — E785 Hyperlipidemia, unspecified: Secondary | ICD-10-CM | POA: Diagnosis not present

## 2023-03-07 DIAGNOSIS — C182 Malignant neoplasm of ascending colon: Secondary | ICD-10-CM | POA: Diagnosis not present

## 2023-03-07 DIAGNOSIS — F410 Panic disorder [episodic paroxysmal anxiety] without agoraphobia: Secondary | ICD-10-CM | POA: Diagnosis not present

## 2023-03-07 DIAGNOSIS — Z79899 Other long term (current) drug therapy: Secondary | ICD-10-CM | POA: Diagnosis not present

## 2023-03-07 DIAGNOSIS — Z5111 Encounter for antineoplastic chemotherapy: Secondary | ICD-10-CM | POA: Diagnosis not present

## 2023-03-07 DIAGNOSIS — Z800 Family history of malignant neoplasm of digestive organs: Secondary | ICD-10-CM | POA: Diagnosis not present

## 2023-03-07 DIAGNOSIS — I10 Essential (primary) hypertension: Secondary | ICD-10-CM | POA: Diagnosis not present

## 2023-03-07 DIAGNOSIS — F32A Depression, unspecified: Secondary | ICD-10-CM | POA: Diagnosis not present

## 2023-03-07 DIAGNOSIS — K219 Gastro-esophageal reflux disease without esophagitis: Secondary | ICD-10-CM | POA: Diagnosis not present

## 2023-03-07 DIAGNOSIS — C189 Malignant neoplasm of colon, unspecified: Secondary | ICD-10-CM

## 2023-03-07 MED ORDER — SODIUM CHLORIDE 0.9% FLUSH
10.0000 mL | INTRAVENOUS | Status: DC | PRN
  Administered 2023-03-07: 10 mL

## 2023-03-07 MED ORDER — HEPARIN SOD (PORK) LOCK FLUSH 100 UNIT/ML IV SOLN
500.0000 [IU] | Freq: Once | INTRAVENOUS | Status: AC | PRN
  Administered 2023-03-07: 500 [IU]

## 2023-03-07 NOTE — Patient Instructions (Signed)

## 2023-03-08 ENCOUNTER — Encounter: Payer: Self-pay | Admitting: Oncology

## 2023-03-09 ENCOUNTER — Encounter: Payer: Self-pay | Admitting: Oncology

## 2023-03-12 ENCOUNTER — Encounter: Payer: Self-pay | Admitting: Oncology

## 2023-03-12 ENCOUNTER — Other Ambulatory Visit: Payer: Self-pay | Admitting: *Deleted

## 2023-03-12 DIAGNOSIS — C189 Malignant neoplasm of colon, unspecified: Secondary | ICD-10-CM

## 2023-03-15 ENCOUNTER — Other Ambulatory Visit: Payer: Self-pay | Admitting: Oncology

## 2023-03-17 ENCOUNTER — Other Ambulatory Visit: Payer: Self-pay | Admitting: Oncology

## 2023-03-19 ENCOUNTER — Inpatient Hospital Stay (HOSPITAL_BASED_OUTPATIENT_CLINIC_OR_DEPARTMENT_OTHER): Payer: Medicare HMO | Admitting: Nurse Practitioner

## 2023-03-19 ENCOUNTER — Encounter: Payer: Self-pay | Admitting: *Deleted

## 2023-03-19 ENCOUNTER — Encounter: Payer: Self-pay | Admitting: Nurse Practitioner

## 2023-03-19 ENCOUNTER — Inpatient Hospital Stay: Payer: Medicare HMO

## 2023-03-19 VITALS — BP 115/66 | HR 78 | Temp 97.6°F | Resp 18

## 2023-03-19 VITALS — BP 120/69 | HR 88 | Temp 98.1°F | Resp 18 | Ht 66.0 in | Wt 243.2 lb

## 2023-03-19 DIAGNOSIS — C787 Secondary malignant neoplasm of liver and intrahepatic bile duct: Secondary | ICD-10-CM | POA: Diagnosis not present

## 2023-03-19 DIAGNOSIS — K219 Gastro-esophageal reflux disease without esophagitis: Secondary | ICD-10-CM | POA: Diagnosis not present

## 2023-03-19 DIAGNOSIS — I10 Essential (primary) hypertension: Secondary | ICD-10-CM | POA: Diagnosis not present

## 2023-03-19 DIAGNOSIS — C189 Malignant neoplasm of colon, unspecified: Secondary | ICD-10-CM

## 2023-03-19 DIAGNOSIS — E785 Hyperlipidemia, unspecified: Secondary | ICD-10-CM | POA: Diagnosis not present

## 2023-03-19 DIAGNOSIS — F410 Panic disorder [episodic paroxysmal anxiety] without agoraphobia: Secondary | ICD-10-CM | POA: Diagnosis not present

## 2023-03-19 DIAGNOSIS — D509 Iron deficiency anemia, unspecified: Secondary | ICD-10-CM | POA: Diagnosis not present

## 2023-03-19 DIAGNOSIS — Z79899 Other long term (current) drug therapy: Secondary | ICD-10-CM | POA: Diagnosis not present

## 2023-03-19 DIAGNOSIS — Z5111 Encounter for antineoplastic chemotherapy: Secondary | ICD-10-CM | POA: Diagnosis not present

## 2023-03-19 DIAGNOSIS — C182 Malignant neoplasm of ascending colon: Secondary | ICD-10-CM | POA: Diagnosis not present

## 2023-03-19 DIAGNOSIS — F32A Depression, unspecified: Secondary | ICD-10-CM | POA: Diagnosis not present

## 2023-03-19 DIAGNOSIS — Z800 Family history of malignant neoplasm of digestive organs: Secondary | ICD-10-CM | POA: Diagnosis not present

## 2023-03-19 LAB — CBC WITH DIFFERENTIAL (CANCER CENTER ONLY)
Abs Immature Granulocytes: 0.01 10*3/uL (ref 0.00–0.07)
Basophils Absolute: 0 10*3/uL (ref 0.0–0.1)
Basophils Relative: 1 %
Eosinophils Absolute: 0.1 10*3/uL (ref 0.0–0.5)
Eosinophils Relative: 3 %
HCT: 31.5 % — ABNORMAL LOW (ref 39.0–52.0)
Hemoglobin: 9.4 g/dL — ABNORMAL LOW (ref 13.0–17.0)
Immature Granulocytes: 0 %
Lymphocytes Relative: 29 %
Lymphs Abs: 0.7 10*3/uL (ref 0.7–4.0)
MCH: 23.5 pg — ABNORMAL LOW (ref 26.0–34.0)
MCHC: 29.8 g/dL — ABNORMAL LOW (ref 30.0–36.0)
MCV: 78.8 fL — ABNORMAL LOW (ref 80.0–100.0)
Monocytes Absolute: 0.3 10*3/uL (ref 0.1–1.0)
Monocytes Relative: 11 %
Neutro Abs: 1.3 10*3/uL — ABNORMAL LOW (ref 1.7–7.7)
Neutrophils Relative %: 56 %
Platelet Count: 150 10*3/uL (ref 150–400)
RBC: 4 MIL/uL — ABNORMAL LOW (ref 4.22–5.81)
RDW: 17.1 % — ABNORMAL HIGH (ref 11.5–15.5)
WBC Count: 2.4 10*3/uL — ABNORMAL LOW (ref 4.0–10.5)
nRBC: 0 % (ref 0.0–0.2)

## 2023-03-19 LAB — CMP (CANCER CENTER ONLY)
ALT: 38 U/L (ref 0–44)
AST: 39 U/L (ref 15–41)
Albumin: 3.7 g/dL (ref 3.5–5.0)
Alkaline Phosphatase: 167 U/L — ABNORMAL HIGH (ref 38–126)
Anion gap: 9 (ref 5–15)
BUN: 10 mg/dL (ref 6–20)
CO2: 24 mmol/L (ref 22–32)
Calcium: 8.3 mg/dL — ABNORMAL LOW (ref 8.9–10.3)
Chloride: 103 mmol/L (ref 98–111)
Creatinine: 0.67 mg/dL (ref 0.61–1.24)
GFR, Estimated: >60 mL/min (ref >60)
Glucose, Bld: 304 mg/dL — ABNORMAL HIGH (ref 70–99)
Potassium: 3.4 mmol/L — ABNORMAL LOW (ref 3.5–5.1)
Sodium: 136 mmol/L (ref 135–145)
Total Bilirubin: 0.8 mg/dL (ref 0.3–1.2)
Total Protein: 6.9 g/dL (ref 6.5–8.1)

## 2023-03-19 LAB — CEA (ACCESS): CEA (CHCC): 32.43 ng/mL — ABNORMAL HIGH (ref 0.00–5.00)

## 2023-03-19 LAB — FERRITIN: Ferritin: 25 ng/mL (ref 24–336)

## 2023-03-19 LAB — TOTAL PROTEIN, URINE DIPSTICK

## 2023-03-19 MED ORDER — SODIUM CHLORIDE 0.9 % IV SOLN
200.0000 mg/m2 | Freq: Once | INTRAVENOUS | Status: AC
  Administered 2023-03-19: 444 mg via INTRAVENOUS
  Filled 2023-03-19: qty 22.2

## 2023-03-19 MED ORDER — SODIUM CHLORIDE 0.9 % IV SOLN: Freq: Once | INTRAVENOUS | Status: AC

## 2023-03-19 MED ORDER — SODIUM CHLORIDE 0.9% FLUSH: 10.0000 mL | INTRAVENOUS | Status: DC | PRN

## 2023-03-19 MED ORDER — ATROPINE SULFATE 1 MG/ML IV SOLN
0.5000 mg | Freq: Once | INTRAVENOUS | Status: AC | PRN
  Administered 2023-03-19: 0.5 mg via INTRAVENOUS
  Filled 2023-03-19: qty 1

## 2023-03-19 MED ORDER — SODIUM CHLORIDE 0.9 % IV SOLN
10.0000 mg | Freq: Once | INTRAVENOUS | Status: AC
  Administered 2023-03-19: 10 mg via INTRAVENOUS
  Filled 2023-03-19: qty 10

## 2023-03-19 MED ORDER — PALONOSETRON HCL INJECTION 0.25 MG/5ML
0.2500 mg | Freq: Once | INTRAVENOUS | Status: AC
  Administered 2023-03-19: 0.25 mg via INTRAVENOUS
  Filled 2023-03-19: qty 5

## 2023-03-19 MED ORDER — SODIUM CHLORIDE 0.9 % IV SOLN
1600.0000 mg/m2 | INTRAVENOUS | Status: DC
  Administered 2023-03-19: 3500 mg via INTRAVENOUS
  Filled 2023-03-19: qty 70

## 2023-03-19 MED ORDER — SODIUM CHLORIDE 0.9 % IV SOLN
180.0000 mg/m2 | Freq: Once | INTRAVENOUS | Status: AC
  Administered 2023-03-19: 400 mg via INTRAVENOUS
  Filled 2023-03-19: qty 15

## 2023-03-19 MED ORDER — SODIUM CHLORIDE 0.9 % IV SOLN
5.0000 mg/kg | Freq: Once | INTRAVENOUS | Status: AC
  Administered 2023-03-19: 500 mg via INTRAVENOUS
  Filled 2023-03-19: qty 16

## 2023-03-19 MED ORDER — TRIAMCINOLONE ACETONIDE 0.1 % MT PSTE
1.0000 | PASTE | Freq: Two times a day (BID) | OROMUCOSAL | 1 refills | Status: DC | PRN
Start: 2023-03-19 — End: 2023-04-06

## 2023-03-19 NOTE — Patient Instructions (Addendum)
[facility]  The chemotherapy medication bag should finish at 46 hours, 96 hours, or 7 days. For example, if your pump is scheduled for 46 hours and it was put on at 4:00 p.m., it should finish at 2:00 p.m. the day it is scheduled to come off regardless of your appointment time.     Estimated time to finish at 1:00  Wednesday, March 21, 2023.   If the display on your pump reads "Low Volume" and it is beeping, take the batteries out of the pump and come to the cancer center for it to be taken off.   If the pump alarms go off prior to the pump reading "Low Volume" then call [phone] and someone can assist you.  If the plunger comes out and the chemotherapy medication is leaking out, please use your home chemo spill kit to clean up the spill. Do NOT use paper towels or other household products.  If you have problems or questions regarding your pump, please call either [phone] (24 hours a day) or the cancer center Monday-Friday 8:00 a.m.- 4:30 p.m. at the clinic number and we will assist you. If you are unable to get assistance, then go to the nearest Emergency Department and ask the staff to contact the IV team for assistance.  Discharge Instructions: Thank you for choosing [facility] to provide your oncology and hematology care.   If you have a lab appointment with the Cancer Center, please go directly to the Cancer Center and check in at the registration area.   Wear comfortable clothing and clothing appropriate for easy access to any Portacath or PICC line.   We strive to give you quality time with your provider. You may need to reschedule your appointment if you arrive late (15 or more minutes).  Arriving late affects you and other patients whose appointments are after yours.  Also, if you miss three or more appointments without notifying the office, you may be dismissed from the clinic at the provider's discretion.      For  prescription refill requests, have your pharmacy contact our office and allow 72 hours for refills to be completed.    Today you received the following chemotherapy and/or immunotherapy agents Bevacizumab-adcd (VEGZELMA), Irinotecan (CAMPTOSAR), Leucovorin & Flourouracil (ADRUCIL).      To help prevent nausea and vomiting after your treatment, we encourage you to take your nausea medication as directed.  BELOW ARE SYMPTOMS THAT SHOULD BE REPORTED IMMEDIATELY: *FEVER GREATER THAN 100.4 F (38 C) OR HIGHER *CHILLS OR SWEATING *NAUSEA AND VOMITING THAT IS NOT CONTROLLED WITH YOUR NAUSEA MEDICATION *UNUSUAL SHORTNESS OF BREATH *UNUSUAL BRUISING OR BLEEDING *URINARY PROBLEMS (pain or burning when urinating, or frequent urination) *BOWEL PROBLEMS (unusual diarrhea, constipation, pain near the anus) TENDERNESS IN MOUTH AND THROAT WITH OR WITHOUT PRESENCE OF ULCERS (sore throat, sores in mouth, or a toothache) UNUSUAL RASH, SWELLING OR PAIN  UNUSUAL VAGINAL DISCHARGE OR ITCHING   Items with * indicate a potential emergency and should be followed up as soon as possible or go to the Emergency Department if any problems should occur.  Please show the CHEMOTHERAPY ALERT CARD or IMMUNOTHERAPY ALERT CARD at check-in to the Emergency Department and triage nurse.  Should you have questions after your visit or need to cancel or reschedule your appointment, please contact  [facility]  Dept: [phone]  and follow the prompts.  Office hours are 8:00 a.m. to 4:30 p.m.  Monday - Friday. Please note that voicemails left after 4:00 p.m. may not be returned until the following business day.  We are closed weekends and major holidays. You have access to a nurse at all times for urgent questions. Please call the main number to the clinic Dept: [phone] and follow the prompts.   For any non-urgent questions, you may also contact your provider using MyChart. We now offer  e-Visits for anyone 14 and older to request care online for non-urgent symptoms. For details visit [email or web address].   Also download the MyChart app! Go to the app store, search "MyChart", open the app, select [facility], and log in with your MyChart username and password.  Bevacizumab Injection What is this medication? BEVACIZUMAB (be va SIZ yoo mab) treats some types of cancer. It works by blocking a protein that causes cancer cells to grow and multiply. This helps to slow or stop the spread of cancer cells. It is a monoclonal antibody. This medicine may be used for other purposes; ask your health care provider or pharmacist if you have questions. COMMON BRAND NAME(S): Alymsys, Avastin, MVASI, Omer Jack What should I tell my care team before I take this medication? They need to know if you have any of these conditions: Blood clots Coughing up blood Having or recent surgery Heart failure High blood pressure History of a connection between 2 or more body parts that do not usually connect (fistula) History of a tear in your stomach or intestines Protein in your urine An unusual or allergic reaction to bevacizumab, other medications, foods, dyes, or preservatives Pregnant or trying to get pregnant Breast-feeding How should I use this medication? This medication is injected into a vein. It is given by your care team in a hospital or clinic setting. Talk to your care team the use of this medication in children. Special care may be needed. Overdosage: If you think you have taken too much of this medicine contact a poison control center or emergency room at once. NOTE: This medicine is only for you. Do not share this medicine with others. What if I miss a dose? Keep appointments for follow-up doses. It is important not to miss your dose. Call your care team if you are unable to keep an appointment. What may interact with this medication? Interactions are not expected. This list may not  describe all possible interactions. Give your health care provider a list of all the medicines, herbs, non-prescription drugs, or dietary supplements you use. Also tell them if you smoke, drink alcohol, or use illegal drugs. Some items may interact with your medicine. What should I watch for while using this medication? Your condition will be monitored carefully while you are receiving this medication. You may need blood work while taking this medication. This medication may make you feel generally unwell. This is not uncommon as chemotherapy can affect healthy cells as well as cancer cells. Report any side effects. Continue your course of treatment even though you feel ill unless your care team tells you to stop. This medication may increase your risk to bruise or bleed. Call your care team if you notice any unusual bleeding. Before having surgery, talk to your care team to make sure it is ok. This medication can increase the risk of poor healing of your surgical site or wound. You will need to stop this medication for 28 days before surgery. After surgery, wait at least 28 days before restarting this medication. Make sure the surgical site or wound is  healed enough before restarting this medication. Talk to your care team if questions. Talk to your care team if you may be pregnant. Serious birth defects can occur if you take this medication during pregnancy and for 6 months after the last dose. Contraception is recommended while taking this medication and for 6 months after the last dose. Your care team can help you find the option that works for you. Do not breastfeed while taking this medication and for 6 months after the last dose. This medication can cause infertility. Talk to your care team if you are concerned about your fertility. What side effects may I notice from receiving this medication? Side effects that you should report to your care team as soon as possible: Allergic reactions--skin rash,  itching, hives, swelling of the face, lips, tongue, or throat Bleeding--bloody or black, tar-like stools, vomiting blood or brown material that looks like coffee grounds, red or dark brown urine, small red or purple spots on skin, unusual bruising or bleeding Blood clot--pain, swelling, or warmth in the leg, shortness of breath, chest pain Heart attack--pain or tightness in the chest, shoulders, arms, or jaw, nausea, shortness of breath, cold or clammy skin, feeling faint or lightheaded Heart failure--shortness of breath, swelling of the ankles, feet, or hands, sudden weight gain, unusual weakness or fatigue Increase in blood pressure Infection--fever, chills, cough, sore throat, wounds that don't heal, pain or trouble when passing urine, general feeling of discomfort or being unwell Infusion reactions--chest pain, shortness of breath or trouble breathing, feeling faint or lightheaded Kidney injury--decrease in the amount of urine, swelling of the ankles, hands, or feet Stomach pain that is severe, does not go away, or gets worse Stroke--sudden numbness or weakness of the face, arm, or leg, trouble speaking, confusion, trouble walking, loss of balance or coordination, dizziness, severe headache, change in vision Sudden and severe headache, confusion, change in vision, seizures, which may be signs of posterior reversible encephalopathy syndrome (PRES) Side effects that usually do not require medical attention (report to your care team if they continue or are bothersome): Back pain Change in taste Diarrhea Dry skin Increased tears Nosebleed This list may not describe all possible side effects. Call your doctor for medical advice about side effects. You may report side effects to FDA at 1-800-FDA-1088. Where should I keep my medication? This medication is given in a hospital or clinic. It will not be stored at home. NOTE: This sheet is a summary. It may not cover all possible information. If you  have questions about this medicine, talk to your doctor, pharmacist, or health care provider.  2024 Elsevier/Gold Standard (2021-12-30 00:00:00)  Irinotecan Injection What is this medication? IRINOTECAN (ir in oh TEE kan) treats some types of cancer. It works by slowing down the growth of cancer cells. This medicine may be used for other purposes; ask your health care provider or pharmacist if you have questions. COMMON BRAND NAME(S): Camptosar What should I tell my care team before I take this medication? They need to know if you have any of these conditions: Dehydration Diarrhea Infection, especially a viral infection, such as chickenpox, cold sores, herpes Liver disease Low blood cell levels (white cells, red cells, and platelets) Low levels of electrolytes, such as calcium, magnesium, or potassium in your blood Recent or ongoing radiation An unusual or allergic reaction to irinotecan, other medications, foods, dyes, or preservatives If you or your partner are pregnant or trying to get pregnant Breast-feeding How should I use this medication?  This medication is injected into a vein. It is given by your care team in a hospital or clinic setting. Talk to your care team about the use of this medication in children. Special care may be needed. Overdosage: If you think you have taken too much of this medicine contact a poison control center or emergency room at once. NOTE: This medicine is only for you. Do not share this medicine with others. What if I miss a dose? Keep appointments for follow-up doses. It is important not to miss your dose. Call your care team if you are unable to keep an appointment. What may interact with this medication? Do not take this medication with any of the following: Cobicistat Itraconazole This medication may also interact with the following: Certain antibiotics, such as clarithromycin, rifampin, rifabutin Certain antivirals for HIV or AIDS Certain  medications for fungal infections, such as ketoconazole, posaconazole, voriconazole Certain medications for seizures, such as carbamazepine, phenobarbital, phenytoin Gemfibrozil Nefazodone St. John's wort This list may not describe all possible interactions. Give your health care provider a list of all the medicines, herbs, non-prescription drugs, or dietary supplements you use. Also tell them if you smoke, drink alcohol, or use illegal drugs. Some items may interact with your medicine. What should I watch for while using this medication? Your condition will be monitored carefully while you are receiving this medication. You may need blood work while taking this medication. This medication may make you feel generally unwell. This is not uncommon as chemotherapy can affect healthy cells as well as cancer cells. Report any side effects. Continue your course of treatment even though you feel ill unless your care team tells you to stop. This medication can cause serious side effects. To reduce the risk, your care team may give you other medications to take before receiving this one. Be sure to follow the directions from your care team. This medication may affect your coordination, reaction time, or judgement. Do not drive or operate machinery until you know how this medication affects you. Sit up or stand slowly to reduce the risk of dizzy or fainting spells. Drinking alcohol with this medication can increase the risk of these side effects. This medication may increase your risk of getting an infection. Call your care team for advice if you get a fever, chills, sore throat, or other symptoms of a cold or flu. Do not treat yourself. Try to avoid being around people who are sick. Avoid taking medications that contain aspirin, acetaminophen, ibuprofen, naproxen, or ketoprofen unless instructed by your care team. These medications may hide a fever. This medication may increase your risk to bruise or bleed. Call  your care team if you notice any unusual bleeding. Be careful brushing or flossing your teeth or using a toothpick because you may get an infection or bleed more easily. If you have any dental work done, tell your dentist you are receiving this medication. Talk to your care team if you or your partner are pregnant or think either of you might be pregnant. This medication can cause serious birth defects if taken during pregnancy and for 6 months after the last dose. You will need a negative pregnancy test before starting this medication. Contraception is recommended while taking this medication and for 6 months after the last dose. Your care team can help you find the option that works for you. Do not father a child while taking this medication and for 3 months after the last dose. Use a condom for  contraception during this time period. Do not breastfeed while taking this medication and for 7 days after the last dose. This medication may cause infertility. Talk to your care team if you are concerned about your fertility. What side effects may I notice from receiving this medication? Side effects that you should report to your care team as soon as possible: Allergic reactions--skin rash, itching, hives, swelling of the face, lips, tongue, or throat Dry cough, shortness of breath or trouble breathing Increased saliva or tears, increased sweating, stomach cramping, diarrhea, small pupils, unusual weakness or fatigue, slow heartbeat Infection--fever, chills, cough, sore throat, wounds that don't heal, pain or trouble when passing urine, general feeling of discomfort or being unwell Kidney injury--decrease in the amount of urine, swelling of the ankles, hands, or feet Low red blood cell level--unusual weakness or fatigue, dizziness, headache, trouble breathing Severe or prolonged diarrhea Unusual bruising or bleeding Side effects that usually do not require medical attention (report to your care team if  they continue or are bothersome): Constipation Diarrhea Hair loss Loss of appetite Nausea Stomach pain This list may not describe all possible side effects. Call your doctor for medical advice about side effects. You may report side effects to FDA at 1-800-FDA-1088. Where should I keep my medication? This medication is given in a hospital or clinic. It will not be stored at home. NOTE: This sheet is a summary. It may not cover all possible information. If you have questions about this medicine, talk to your doctor, pharmacist, or health care provider.  2024 Elsevier/Gold Standard (2021-12-26 00:00:00)  Leucovorin Injection What is this medication? LEUCOVORIN (loo koe VOR in) prevents side effects from certain medications, such as methotrexate. It works by increasing folate levels. This helps protect healthy cells in your body. It may also be used to treat anemia caused by low levels of folate. It can also be used with fluorouracil, a type of chemotherapy, to treat colorectal cancer. It works by increasing the effects of fluorouracil in the body. This medicine may be used for other purposes; ask your health care provider or pharmacist if you have questions. What should I tell my care team before I take this medication? They need to know if you have any of these conditions: Anemia from low levels of vitamin B12 in the blood An unusual or allergic reaction to leucovorin, folic acid, other medications, foods, dyes, or preservatives Pregnant or trying to get pregnant Breastfeeding How should I use this medication? This medication is injected into a vein or a muscle. It is given by your care team in a hospital or clinic setting. Talk to your care team about the use of this medication in children. Special care may be needed. Overdosage: If you think you have taken too much of this medicine contact a poison control center or emergency room at once. NOTE: This medicine is only for you. Do not  share this medicine with others. What if I miss a dose? Keep appointments for follow-up doses. It is important not to miss your dose. Call your care team if you are unable to keep an appointment. What may interact with this medication? Capecitabine Fluorouracil Phenobarbital Phenytoin Primidone Trimethoprim;sulfamethoxazole This list may not describe all possible interactions. Give your health care provider a list of all the medicines, herbs, non-prescription drugs, or dietary supplements you use. Also tell them if you smoke, drink alcohol, or use illegal drugs. Some items may interact with your medicine. What should I watch for while using this  medication? Your condition will be monitored carefully while you are receiving this medication. This medication may increase the side effects of 5-fluorouracil. Tell your care team if you have diarrhea or mouth sores that do not get better or that get worse. What side effects may I notice from receiving this medication? Side effects that you should report to your care team as soon as possible: Allergic reactions--skin rash, itching, hives, swelling of the face, lips, tongue, or throat This list may not describe all possible side effects. Call your doctor for medical advice about side effects. You may report side effects to FDA at 1-800-FDA-1088. Where should I keep my medication? This medication is given in a hospital or clinic. It will not be stored at home. NOTE: This sheet is a summary. It may not cover all possible information. If you have questions about this medicine, talk to your doctor, pharmacist, or health care provider.  2024 Elsevier/Gold Standard (2022-01-17 00:00:00)  Fluorouracil Injection What is this medication? FLUOROURACIL (flure oh YOOR a sil) treats some types of cancer. It works by slowing down the growth of cancer cells. This medicine may be used for other purposes; ask your health care provider or pharmacist if you have  questions. COMMON BRAND NAME(S): Adrucil What should I tell my care team before I take this medication? They need to know if you have any of these conditions: Blood disorders Dihydropyrimidine dehydrogenase (DPD) deficiency Infection, such as chickenpox, cold sores, herpes Kidney disease Liver disease Poor nutrition Recent or ongoing radiation therapy An unusual or allergic reaction to fluorouracil, other medications, foods, dyes, or preservatives If you or your partner are pregnant or trying to get pregnant Breast-feeding How should I use this medication? This medication is injected into a vein. It is administered by your care team in a hospital or clinic setting. Talk to your care team about the use of this medication in children. Special care may be needed. Overdosage: If you think you have taken too much of this medicine contact a poison control center or emergency room at once. NOTE: This medicine is only for you. Do not share this medicine with others. What if I miss a dose? Keep appointments for follow-up doses. It is important not to miss your dose. Call your care team if you are unable to keep an appointment. What may interact with this medication? Do not take this medication with any of the following: Live virus vaccines This medication may also interact with the following: Medications that treat or prevent blood clots, such as warfarin, enoxaparin, dalteparin This list may not describe all possible interactions. Give your health care provider a list of all the medicines, herbs, non-prescription drugs, or dietary supplements you use. Also tell them if you smoke, drink alcohol, or use illegal drugs. Some items may interact with your medicine. What should I watch for while using this medication? Your condition will be monitored carefully while you are receiving this medication. This medication may make you feel generally unwell. This is not uncommon as chemotherapy can affect  healthy cells as well as cancer cells. Report any side effects. Continue your course of treatment even though you feel ill unless your care team tells you to stop. In some cases, you may be given additional medications to help with side effects. Follow all directions for their use. This medication may increase your risk of getting an infection. Call your care team for advice if you get a fever, chills, sore throat, or other symptoms of a  cold or flu. Do not treat yourself. Try to avoid being around people who are sick. This medication may increase your risk to bruise or bleed. Call your care team if you notice any unusual bleeding. Be careful brushing or flossing your teeth or using a toothpick because you may get an infection or bleed more easily. If you have any dental work done, tell your dentist you are receiving this medication. Avoid taking medications that contain aspirin, acetaminophen, ibuprofen, naproxen, or ketoprofen unless instructed by your care team. These medications may hide a fever. Do not treat diarrhea with over the counter products. Contact your care team if you have diarrhea that lasts more than 2 days or if it is severe and watery. This medication can make you more sensitive to the sun. Keep out of the sun. If you cannot avoid being in the sun, wear protective clothing and sunscreen. Do not use sun lamps, tanning beds, or tanning booths. Talk to your care team if you or your partner wish to become pregnant or think you might be pregnant. This medication can cause serious birth defects if taken during pregnancy and for 3 months after the last dose. A reliable form of contraception is recommended while taking this medication and for 3 months after the last dose. Talk to your care team about effective forms of contraception. Do not father a child while taking this medication and for 3 months after the last dose. Use a condom while having sex during this time period. Do not breastfeed  while taking this medication. This medication may cause infertility. Talk to your care team if you are concerned about your fertility. What side effects may I notice from receiving this medication? Side effects that you should report to your care team as soon as possible: Allergic reactions--skin rash, itching, hives, swelling of the face, lips, tongue, or throat Heart attack--pain or tightness in the chest, shoulders, arms, or jaw, nausea, shortness of breath, cold or clammy skin, feeling faint or lightheaded Heart failure--shortness of breath, swelling of the ankles, feet, or hands, sudden weight gain, unusual weakness or fatigue Heart rhythm changes--fast or irregular heartbeat, dizziness, feeling faint or lightheaded, chest pain, trouble breathing High ammonia level--unusual weakness or fatigue, confusion, loss of appetite, nausea, vomiting, seizures Infection--fever, chills, cough, sore throat, wounds that don't heal, pain or trouble when passing urine, general feeling of discomfort or being unwell Low red blood cell level--unusual weakness or fatigue, dizziness, headache, trouble breathing Pain, tingling, or numbness in the hands or feet, muscle weakness, change in vision, confusion or trouble speaking, loss of balance or coordination, trouble walking, seizures Redness, swelling, and blistering of the skin over hands and feet Severe or prolonged diarrhea Unusual bruising or bleeding Side effects that usually do not require medical attention (report to your care team if they continue or are bothersome): Dry skin Headache Increased tears Nausea Pain, redness, or swelling with sores inside the mouth or throat Sensitivity to light Vomiting This list may not describe all possible side effects. Call your doctor for medical advice about side effects. You may report side effects to FDA at 1-800-FDA-1088. Where should I keep my medication? This medication is given in a hospital or clinic. It  will not be stored at home. NOTE: This sheet is a summary. It may not cover all possible information. If you have questions about this medicine, talk to your doctor, pharmacist, or health care provider.  2024 Elsevier/Gold Standard (2021-12-20 00:00:00)  The chemotherapy medication bag should  finish at 46 hours, 96 hours, or 7 days. For example, if your pump is scheduled for 46 hours and it was put on at 4:00 p.m., it should finish at 2:00 p.m. the day it is scheduled to come off regardless of your appointment time.

## 2023-03-19 NOTE — Progress Notes (Signed)
Patient seen by Lonna Cobb NP today  Vitals are within treatment parameters.  Labs reviewed by Lonna Cobb NP and are not all within treatment parameters. ANC 1.3--OK to proceed. Will receive Fulphila on day 3.  Urine protein-trace-OK to proceed.No intervention needed for elevated glucose or K+ 3.4  Per physician team, patient is ready for treatment. Please note that modifications are being made to the treatment plan including Will hold 5FU bolus today

## 2023-03-19 NOTE — Progress Notes (Signed)
[facility] OFFICE PROGRESS NOTE   Diagnosis: Colon cancer  INTERVAL HISTORY:   Mr. Emanuele returns as scheduled.  He completed cycle 2 FOLFIRI plus Avastin 03/05/2023.  He had mild nausea.  No vomiting.  He developed multiple ulcers on the edges of his tongue.  The ulcers have healed.  He notes recurrent tongue "burning".   He is able to eat and drink without difficulty.  No diarrhea.  He denies bleeding.    Objective:  Vital signs in last 24 hours:  Blood pressure 120/69, pulse 88, temperature 98.1 F (36.7 C), temperature source Oral, resp. rate 18, height 5\' 6"  (1.676 m), weight 243 lb 3.2 oz (110.3 kg), SpO2 98%.    HEENT: No thrush or ulcers. Resp: Lungs clear bilaterally. Cardio: Regular rate and rhythm. GI: Abdomen soft and nontender.  No hepatosplenomegaly. Vascular: No leg edema. Skin: Palms without erythema. Port-A-Cath without erythema.  Lab Results:  Lab Results  Component Value Date   WBC 2.4 (L) 03/19/2023   HGB 9.4 (L) 03/19/2023   HCT 31.5 (L) 03/19/2023   MCV 78.8 (L) 03/19/2023   PLT 150 03/19/2023   NEUTROABS 1.3 (L) 03/19/2023    Imaging:  No results found.  Medications: I have reviewed the patient's current medications.  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 secondary to nausea (patient discontinued Xeloda on day 11 due to colonoscopy) Colonoscopy 07/09/2020-nodular mucosa at the colonic anastomosis, biopsied; patent end-to-side ileocolonic  anastomosis characterized by healthy-appearing mucosa and visible sutures; nonbleeding internal hemorrhoids, biopsy with benign ulcerated anastomotic mucosa with granulation tissue Cycle 4 adjuvant Xeloda 07/19/2020, 2000 mg every morning and 1500 mg every afternoon Cycle 5 adjuvant Xeloda 08/09/2020 Cycle 6 adjuvant Xeloda 08/31/2019 Cycle 7 adjuvant Xeloda 09/20/2020 Cycle 8 adjuvant Xeloda 10/11/2020 CTs 03/07/2021-no evidence of recurrent disease, hepatic steatosis, slight wall thickening at the junction of the descending and horizontal duodenum Mild elevation of CEA 2023 12/01/2021-Guardant Reveal-ct DNA detected PET 12/30/2021-hypermetabolic right liver lesion, hypermetabolic area in a loop of mid small bowel with an SUV of 12.5, review of PET images at GI tumor conference 01/11/2022-small bowel uptake felt to be a benign finding MRI liver 01/10/2022-solitary 1.2 cm right liver lesion between segments 7 and 8 compatible with metastasis, subtle changes suggesting cirrhosis, hepatic steatosis CT enteroscopy 02/02/2022-small bowel loop with hypermetabolic activity on PET continues to have a bandlike density along the margin felt to represent a diverticulum or adjacent nodal tissue.  Small bowel tumor not excluded.  3-4 mm left omental nodule 02/08/2022-biopsy and ablation of solitary liver lesion, adenocarcinoma consistent with metastatic colorectal adenocarcinoma CTs 03/31/2022-unchanged circumstantial soft tissue thickening surrounding a loop of mid to distal small bowel with an adjacent nodular focus measuring 1.2 cm.,  Ablation cavity in segment 7, no other evidence of metastatic disease Diagnostic laparoscopy 05/26/2022-mid small bowel mass-resected, well to moderately differentiated adenocarcinoma, 0/2 lymph nodes, morphology consistent with a colon primary, tumor involved full-thickness including serosa with perineural and large vessel involvement; foundation 1-microsatellite stable, tumor mutation burden 2,  K-ras Q61H CT abdomen/pelvis 07/10/2022-the right liver ablation defect is smaller, new  subtle right liver lesion measuring 13 x 9 mm, enlarged lymph node in the right upper quadrant mesentery adjacent to surgical anastomosis PET 07/26/2022-new segment 6 liver lesion, enlarging hypermetabolic left omental lesion, there is another mildly hypermetabolic peritoneal implant, 7 mm omental nodule at the midline without hypermetabolism Cycle 1 FOLFOX 09/25/2022 Cycle 2 FOLFOX/bevacizumab 10/09/2022, 5-FU dose reduced secondary to mucositis Cycle 3 FOLFOX/bevacizumab 10/23/2022 Cycle 4 FOLFOX/bevacizumab 11/06/2022 Cycle 5 FOLFOX/bevacizumab 11/20/2022 Cycle 6 FOLFOX/bevacizumab 12/04/2022 CT abdomen/pelvis 12/14/2022-further contraction of the treated segment 7 lesion, slight enlargement of segment 6 lesion, no new liver lesion, increased size of ileocolic nodes and an omental lesion, stable small retroperitoneal nodes Cycle 7 FOLFOX/bevacizumab 12/18/2022 Cycle 8 FOLFOX/bevacizumab 01/02/2023, oxaliplatin dose reduced secondary to prolonged nausea and neuropathy symptoms, Emend added Cycle 9 FOLFOX/bevacizumab 01/16/2023, oxaliplatin discontinued secondary to neuropathy Cycle 1 FOLFIRI 02/19/2023, Avastin held due to recent dental extractions Cycle 2 FOLFIRI/Avastin 03/05/2023 Cycle 3 FOLFIRI/Avastin 03/19/2023, 5-FU bolus held due to mucositis after cycle 2, Fulphila added Microcytic anemia secondary to #1-improved Colon polyps-sessile serrated adenoma of the descending colon, tubular adenomas with focal high-grade dysplasia of the transverse colon on colonoscopy 02/12/2020, tubular adenoma of the mid ascending colon on the surgical specimen 04/12/2020 Family history of colon cancer Depression Anxiety/panic attacks Hypertension Hyperlipidemia Nausea secondary to Xeloda?-Resolved Gastroesophageal reflux disease-improved following discontinuation of Xeloda Anemia, microcytic; ferritin 6 01/24/2022; stool positive for  blood x3 01/25/2022; 02/02/2022 CT abdomen/pelvis enterography-bowel loop demonstrating hypermetabolic activity continues to have a bandlike density along its margin possibly representing a diverticulum or adjacent nodal tissue, difficult to exclude small bowel tumor, 3 x 4 mm left omental nodule or lymph node, known right hepatic lobe tumor is occult on CT. Venofer weekly x3 beginning 03/03/2022 Negative capsule endoscopy 03/09/2022 Venofer 03/03/2022 for 3 weekly doses Venofer 05/03/2022, 05/19/2022 Venofer 07/28/2022, 08/04/2022 12.  Anterior left neck nodule 03/17/2022-cyst?,  Lymph node? 13.  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  Oxaliplatin neuropathy-mild loss of vibratory sense 12/04/2022, 12/18/2022, 01/02/2023      Disposition: [name] appears stable.  He has completed 2 cycles of FOLFIRI, Avastin was added 2 weeks ago.  He developed multiple ulcers on his tongue, since healed.  He also notes recurrent "burning" on his tongue.  Plan to proceed with cycle 3 FOLFIRI/Avastin today as scheduled.  5-FU bolus will be held today due to mouth sores after the last cycle.  He will receive white cell growth factor support on the day of pump discontinuation.  Potential side effects reviewed including bone pain, rash, splenic rupture.  He agrees with this plan.  He has had the burning sensation on his tongue with previous iron deficiency.  We will follow-up on the ferritin from today.  CBC and chemistry panel reviewed.  Labs adequate to proceed as above.  He has mild neutropenia.  White cell growth factor support as above.  He will contact the office with fever, chills, other signs of infection.  He will return for follow-up and treatment in 2 weeks.  We are available to see him sooner if  needed.    Lonna Cobb ANP/GNP-BC   03/19/2023  11:02 AM

## 2023-03-19 NOTE — Progress Notes (Signed)
Ferritin is in low normal range and patient's Hgb has dropped and MCV is low. Per NP: will give IV Venofer weekly x 3. 1st dose 7/25. Scheduling message sent.

## 2023-03-20 ENCOUNTER — Encounter: Payer: Self-pay | Admitting: Nurse Practitioner

## 2023-03-20 ENCOUNTER — Encounter: Payer: Self-pay | Admitting: Oncology

## 2023-03-21 ENCOUNTER — Other Ambulatory Visit: Payer: Self-pay | Admitting: *Deleted

## 2023-03-21 ENCOUNTER — Other Ambulatory Visit: Payer: Self-pay

## 2023-03-21 ENCOUNTER — Inpatient Hospital Stay: Payer: Medicare HMO

## 2023-03-21 ENCOUNTER — Encounter: Payer: Self-pay | Admitting: Oncology

## 2023-03-21 VITALS — BP 119/67 | HR 77 | Temp 98.2°F | Resp 18

## 2023-03-21 DIAGNOSIS — Z5111 Encounter for antineoplastic chemotherapy: Secondary | ICD-10-CM | POA: Diagnosis not present

## 2023-03-21 DIAGNOSIS — Z79899 Other long term (current) drug therapy: Secondary | ICD-10-CM | POA: Diagnosis not present

## 2023-03-21 DIAGNOSIS — F32A Depression, unspecified: Secondary | ICD-10-CM | POA: Diagnosis not present

## 2023-03-21 DIAGNOSIS — I10 Essential (primary) hypertension: Secondary | ICD-10-CM | POA: Diagnosis not present

## 2023-03-21 DIAGNOSIS — C189 Malignant neoplasm of colon, unspecified: Secondary | ICD-10-CM

## 2023-03-21 DIAGNOSIS — D509 Iron deficiency anemia, unspecified: Secondary | ICD-10-CM | POA: Diagnosis not present

## 2023-03-21 DIAGNOSIS — C182 Malignant neoplasm of ascending colon: Secondary | ICD-10-CM | POA: Diagnosis not present

## 2023-03-21 DIAGNOSIS — Z800 Family history of malignant neoplasm of digestive organs: Secondary | ICD-10-CM | POA: Diagnosis not present

## 2023-03-21 DIAGNOSIS — E785 Hyperlipidemia, unspecified: Secondary | ICD-10-CM | POA: Diagnosis not present

## 2023-03-21 DIAGNOSIS — F410 Panic disorder [episodic paroxysmal anxiety] without agoraphobia: Secondary | ICD-10-CM | POA: Diagnosis not present

## 2023-03-21 DIAGNOSIS — K219 Gastro-esophageal reflux disease without esophagitis: Secondary | ICD-10-CM | POA: Diagnosis not present

## 2023-03-21 MED ORDER — SODIUM CHLORIDE 0.9% FLUSH
10.0000 mL | INTRAVENOUS | Status: AC | PRN
  Administered 2023-03-21: 10 mL

## 2023-03-21 MED ORDER — HEPARIN SOD (PORK) LOCK FLUSH 100 UNIT/ML IV SOLN
500.0000 [IU] | INTRAVENOUS | Status: AC | PRN
  Administered 2023-03-21: 500 [IU]

## 2023-03-21 MED ORDER — MAGIC MOUTHWASH: 5.0000 mL | Freq: Four times a day (QID) | ORAL | 1 refills | Status: DC | PRN

## 2023-03-21 MED ORDER — PEGFILGRASTIM-JMDB 6 MG/0.6ML ~~LOC~~ SOSY
6.0000 mg | PREFILLED_SYRINGE | Freq: Once | SUBCUTANEOUS | Status: AC
  Administered 2023-03-21: 6 mg via SUBCUTANEOUS
  Filled 2023-03-21: qty 0.6

## 2023-03-21 NOTE — Patient Instructions (Signed)

## 2023-03-21 NOTE — Telephone Encounter (Signed)
MyChart message requesting refill on [facility].  Refill called to [facility]

## 2023-03-22 ENCOUNTER — Inpatient Hospital Stay: Payer: Medicare HMO

## 2023-03-22 VITALS — BP 103/58 | HR 66 | Temp 98.5°F | Resp 18

## 2023-03-22 DIAGNOSIS — I10 Essential (primary) hypertension: Secondary | ICD-10-CM | POA: Diagnosis not present

## 2023-03-22 DIAGNOSIS — F32A Depression, unspecified: Secondary | ICD-10-CM | POA: Diagnosis not present

## 2023-03-22 DIAGNOSIS — F410 Panic disorder [episodic paroxysmal anxiety] without agoraphobia: Secondary | ICD-10-CM | POA: Diagnosis not present

## 2023-03-22 DIAGNOSIS — K219 Gastro-esophageal reflux disease without esophagitis: Secondary | ICD-10-CM | POA: Diagnosis not present

## 2023-03-22 DIAGNOSIS — Z79899 Other long term (current) drug therapy: Secondary | ICD-10-CM | POA: Diagnosis not present

## 2023-03-22 DIAGNOSIS — E785 Hyperlipidemia, unspecified: Secondary | ICD-10-CM | POA: Diagnosis not present

## 2023-03-22 DIAGNOSIS — D509 Iron deficiency anemia, unspecified: Secondary | ICD-10-CM

## 2023-03-22 DIAGNOSIS — C182 Malignant neoplasm of ascending colon: Secondary | ICD-10-CM | POA: Diagnosis not present

## 2023-03-22 DIAGNOSIS — Z5111 Encounter for antineoplastic chemotherapy: Secondary | ICD-10-CM | POA: Diagnosis not present

## 2023-03-22 DIAGNOSIS — Z800 Family history of malignant neoplasm of digestive organs: Secondary | ICD-10-CM | POA: Diagnosis not present

## 2023-03-22 MED ORDER — HEPARIN SOD (PORK) LOCK FLUSH 100 UNIT/ML IV SOLN
500.0000 [IU] | Freq: Once | INTRAVENOUS | Status: AC | PRN
  Administered 2023-03-22: 500 [IU]

## 2023-03-22 MED ORDER — SODIUM CHLORIDE 0.9 % IV SOLN: Freq: Once | INTRAVENOUS | Status: AC

## 2023-03-22 MED ORDER — SODIUM CHLORIDE 0.9 % IV SOLN
300.0000 mg | Freq: Once | INTRAVENOUS | Status: AC
  Administered 2023-03-22: 300 mg via INTRAVENOUS
  Filled 2023-03-22: qty 300

## 2023-03-22 MED ORDER — SODIUM CHLORIDE 0.9% FLUSH
10.0000 mL | Freq: Once | INTRAVENOUS | Status: AC | PRN
  Administered 2023-03-22: 10 mL

## 2023-03-22 NOTE — Patient Instructions (Signed)
Iron Sucrose Injection What is this medication? IRON SUCROSE (EYE ern SOO krose) treats low levels of iron (iron deficiency anemia) in people with kidney disease. Iron is a mineral that plays an important role in making red blood cells, which carry oxygen from your lungs to the rest of your body. This medicine may be used for other purposes; ask your health care provider or pharmacist if you have questions. COMMON BRAND NAME(S): Venofer What should I tell my care team before I take this medication? They need to know if you have any of these conditions: Anemia not caused by low iron levels Heart disease High levels of iron in the blood Kidney disease Liver disease An unusual or allergic reaction to iron, other medications, foods, dyes, or preservatives Pregnant or trying to get pregnant Breastfeeding How should I use this medication? This medication is for infusion into a vein. It is given in a hospital or clinic setting. Talk to your care team about the use of this medication in children. While this medication may be prescribed for children as young as 2 years for selected conditions, precautions do apply. Overdosage: If you think you have taken too much of this medicine contact a poison control center or emergency room at once. NOTE: This medicine is only for you. Do not share this medicine with others. What if I miss a dose? Keep appointments for follow-up doses. It is important not to miss your dose. Call your care team if you are unable to keep an appointment. What may interact with this medication? Do not take this medication with any of the following: Deferoxamine Dimercaprol Other iron products This medication may also interact with the following: Chloramphenicol Deferasirox This list may not describe all possible interactions. Give your health care provider a list of all the medicines, herbs, non-prescription drugs, or dietary supplements you use. Also tell them if you smoke,  drink alcohol, or use illegal drugs. Some items may interact with your medicine. What should I watch for while using this medication? Visit your care team regularly. Tell your care team if your symptoms do not start to get better or if they get worse. You may need blood work done while you are taking this medication. You may need to follow a special diet. Talk to your care team. Foods that contain iron include: whole grains/cereals, dried fruits, beans, or peas, leafy green vegetables, and organ meats (liver, kidney). What side effects may I notice from receiving this medication? Side effects that you should report to your care team as soon as possible: Allergic reactions--skin rash, itching, hives, swelling of the face, lips, tongue, or throat Low blood pressure--dizziness, feeling faint or lightheaded, blurry vision Shortness of breath Side effects that usually do not require medical attention (report to your care team if they continue or are bothersome): Flushing Headache Joint pain Muscle pain Nausea Pain, redness, or irritation at injection site This list may not describe all possible side effects. Call your doctor for medical advice about side effects. You may report side effects to FDA at 1-800-FDA-1088. Where should I keep my medication? This medication is given in a hospital or clinic. It will not be stored at home. NOTE: This sheet is a summary. It may not cover all possible information. If you have questions about this medicine, talk to your doctor, pharmacist, or health care provider.  2024 Elsevier/Gold Standard (2023-01-19 00:00:00)

## 2023-03-26 DIAGNOSIS — C182 Malignant neoplasm of ascending colon: Secondary | ICD-10-CM | POA: Diagnosis not present

## 2023-03-27 ENCOUNTER — Encounter: Payer: Self-pay | Admitting: Oncology

## 2023-03-29 ENCOUNTER — Encounter: Payer: Self-pay | Admitting: Oncology

## 2023-03-29 ENCOUNTER — Encounter: Payer: Self-pay | Admitting: Nurse Practitioner

## 2023-03-29 ENCOUNTER — Inpatient Hospital Stay: Payer: Medicare HMO | Attending: Oncology

## 2023-03-29 ENCOUNTER — Other Ambulatory Visit: Payer: Self-pay | Admitting: Nurse Practitioner

## 2023-03-29 VITALS — BP 128/70 | HR 69 | Temp 98.3°F | Resp 18 | Ht 66.0 in | Wt 243.4 lb

## 2023-03-29 DIAGNOSIS — D509 Iron deficiency anemia, unspecified: Secondary | ICD-10-CM | POA: Insufficient documentation

## 2023-03-29 DIAGNOSIS — F410 Panic disorder [episodic paroxysmal anxiety] without agoraphobia: Secondary | ICD-10-CM | POA: Insufficient documentation

## 2023-03-29 DIAGNOSIS — E785 Hyperlipidemia, unspecified: Secondary | ICD-10-CM | POA: Insufficient documentation

## 2023-03-29 DIAGNOSIS — K219 Gastro-esophageal reflux disease without esophagitis: Secondary | ICD-10-CM | POA: Diagnosis not present

## 2023-03-29 DIAGNOSIS — Z5111 Encounter for antineoplastic chemotherapy: Secondary | ICD-10-CM | POA: Insufficient documentation

## 2023-03-29 DIAGNOSIS — I10 Essential (primary) hypertension: Secondary | ICD-10-CM | POA: Diagnosis not present

## 2023-03-29 DIAGNOSIS — C182 Malignant neoplasm of ascending colon: Secondary | ICD-10-CM | POA: Diagnosis not present

## 2023-03-29 DIAGNOSIS — Z79899 Other long term (current) drug therapy: Secondary | ICD-10-CM | POA: Diagnosis not present

## 2023-03-29 DIAGNOSIS — F32A Depression, unspecified: Secondary | ICD-10-CM | POA: Diagnosis not present

## 2023-03-29 MED ORDER — SODIUM CHLORIDE 0.9 % IV SOLN
300.0000 mg | Freq: Once | INTRAVENOUS | Status: AC
  Administered 2023-03-29: 300 mg via INTRAVENOUS
  Filled 2023-03-29: qty 300

## 2023-03-29 MED ORDER — SODIUM CHLORIDE 0.9 % IV SOLN: INTRAVENOUS | Status: DC

## 2023-03-29 MED ORDER — HEPARIN SOD (PORK) LOCK FLUSH 100 UNIT/ML IV SOLN
500.0000 [IU] | Freq: Once | INTRAVENOUS | Status: AC | PRN
  Administered 2023-03-29: 500 [IU]

## 2023-03-29 MED ORDER — SODIUM CHLORIDE 0.9% FLUSH
10.0000 mL | Freq: Once | INTRAVENOUS | Status: AC | PRN
  Administered 2023-03-29: 10 mL

## 2023-03-29 NOTE — Patient Instructions (Signed)
Iron Sucrose Injection What is this medication? IRON SUCROSE (EYE ern SOO krose) treats low levels of iron (iron deficiency anemia) in people with kidney disease. Iron is a mineral that plays an important role in making red blood cells, which carry oxygen from your lungs to the rest of your body. This medicine may be used for other purposes; ask your health care provider or pharmacist if you have questions. COMMON BRAND NAME(S): Venofer What should I tell my care team before I take this medication? They need to know if you have any of these conditions: Anemia not caused by low iron levels Heart disease High levels of iron in the blood Kidney disease Liver disease An unusual or allergic reaction to iron, other medications, foods, dyes, or preservatives Pregnant or trying to get pregnant Breastfeeding How should I use this medication? This medication is for infusion into a vein. It is given in a hospital or clinic setting. Talk to your care team about the use of this medication in children. While this medication may be prescribed for children as young as 2 years for selected conditions, precautions do apply. Overdosage: If you think you have taken too much of this medicine contact a poison control center or emergency room at once. NOTE: This medicine is only for you. Do not share this medicine with others. What if I miss a dose? Keep appointments for follow-up doses. It is important not to miss your dose. Call your care team if you are unable to keep an appointment. What may interact with this medication? Do not take this medication with any of the following: Deferoxamine Dimercaprol Other iron products This medication may also interact with the following: Chloramphenicol Deferasirox This list may not describe all possible interactions. Give your health care provider a list of all the medicines, herbs, non-prescription drugs, or dietary supplements you use. Also tell them if you smoke,  drink alcohol, or use illegal drugs. Some items may interact with your medicine. What should I watch for while using this medication? Visit your care team regularly. Tell your care team if your symptoms do not start to get better or if they get worse. You may need blood work done while you are taking this medication. You may need to follow a special diet. Talk to your care team. Foods that contain iron include: whole grains/cereals, dried fruits, beans, or peas, leafy green vegetables, and organ meats (liver, kidney). What side effects may I notice from receiving this medication? Side effects that you should report to your care team as soon as possible: Allergic reactions--skin rash, itching, hives, swelling of the face, lips, tongue, or throat Low blood pressure--dizziness, feeling faint or lightheaded, blurry vision Shortness of breath Side effects that usually do not require medical attention (report to your care team if they continue or are bothersome): Flushing Headache Joint pain Muscle pain Nausea Pain, redness, or irritation at injection site This list may not describe all possible side effects. Call your doctor for medical advice about side effects. You may report side effects to FDA at 1-800-FDA-1088. Where should I keep my medication? This medication is given in a hospital or clinic. It will not be stored at home. NOTE: This sheet is a summary. It may not cover all possible information. If you have questions about this medicine, talk to your doctor, pharmacist, or health care provider.  2024 Elsevier/Gold Standard (2023-01-19 00:00:00)

## 2023-03-30 ENCOUNTER — Other Ambulatory Visit: Payer: Self-pay | Admitting: *Deleted

## 2023-03-30 DIAGNOSIS — C189 Malignant neoplasm of colon, unspecified: Secondary | ICD-10-CM

## 2023-04-01 ENCOUNTER — Other Ambulatory Visit: Payer: Self-pay | Admitting: Oncology

## 2023-04-02 ENCOUNTER — Inpatient Hospital Stay: Payer: Medicare HMO

## 2023-04-02 ENCOUNTER — Inpatient Hospital Stay (HOSPITAL_BASED_OUTPATIENT_CLINIC_OR_DEPARTMENT_OTHER): Payer: Medicaid Other | Admitting: Oncology

## 2023-04-02 ENCOUNTER — Encounter: Payer: Self-pay | Admitting: Oncology

## 2023-04-02 VITALS — BP 120/73 | HR 78 | Temp 98.1°F | Resp 18 | Ht 66.0 in | Wt 243.4 lb

## 2023-04-02 VITALS — BP 116/65 | HR 72 | Resp 18

## 2023-04-02 DIAGNOSIS — C189 Malignant neoplasm of colon, unspecified: Secondary | ICD-10-CM

## 2023-04-02 DIAGNOSIS — C787 Secondary malignant neoplasm of liver and intrahepatic bile duct: Secondary | ICD-10-CM | POA: Diagnosis not present

## 2023-04-02 DIAGNOSIS — D509 Iron deficiency anemia, unspecified: Secondary | ICD-10-CM | POA: Diagnosis not present

## 2023-04-02 DIAGNOSIS — K219 Gastro-esophageal reflux disease without esophagitis: Secondary | ICD-10-CM | POA: Diagnosis not present

## 2023-04-02 DIAGNOSIS — F32A Depression, unspecified: Secondary | ICD-10-CM | POA: Diagnosis not present

## 2023-04-02 DIAGNOSIS — E785 Hyperlipidemia, unspecified: Secondary | ICD-10-CM | POA: Diagnosis not present

## 2023-04-02 DIAGNOSIS — C182 Malignant neoplasm of ascending colon: Secondary | ICD-10-CM | POA: Diagnosis not present

## 2023-04-02 DIAGNOSIS — F410 Panic disorder [episodic paroxysmal anxiety] without agoraphobia: Secondary | ICD-10-CM | POA: Diagnosis not present

## 2023-04-02 DIAGNOSIS — Z79899 Other long term (current) drug therapy: Secondary | ICD-10-CM | POA: Diagnosis not present

## 2023-04-02 DIAGNOSIS — Z5111 Encounter for antineoplastic chemotherapy: Secondary | ICD-10-CM | POA: Diagnosis not present

## 2023-04-02 DIAGNOSIS — I10 Essential (primary) hypertension: Secondary | ICD-10-CM | POA: Diagnosis not present

## 2023-04-02 LAB — CMP (CANCER CENTER ONLY)
ALT: 20 U/L (ref 0–44)
AST: 27 U/L (ref 15–41)
Albumin: 3.7 g/dL (ref 3.5–5.0)
Alkaline Phosphatase: 177 U/L — ABNORMAL HIGH (ref 38–126)
Anion gap: 7 (ref 5–15)
BUN: 8 mg/dL (ref 6–20)
CO2: 27 mmol/L (ref 22–32)
Calcium: 8.4 mg/dL — ABNORMAL LOW (ref 8.9–10.3)
Chloride: 104 mmol/L (ref 98–111)
Creatinine: 0.73 mg/dL (ref 0.61–1.24)
GFR, Estimated: >60 mL/min (ref >60)
Glucose, Bld: 187 mg/dL — ABNORMAL HIGH (ref 70–99)
Potassium: 3.8 mmol/L (ref 3.5–5.1)
Sodium: 138 mmol/L (ref 135–145)
Total Bilirubin: 0.7 mg/dL (ref 0.3–1.2)
Total Protein: 6.7 g/dL (ref 6.5–8.1)

## 2023-04-02 LAB — CBC WITH DIFFERENTIAL (CANCER CENTER ONLY)
Abs Immature Granulocytes: 0.07 10*3/uL (ref 0.00–0.07)
Basophils Absolute: 0 10*3/uL (ref 0.0–0.1)
Basophils Relative: 1 %
Eosinophils Absolute: 0.1 10*3/uL (ref 0.0–0.5)
Eosinophils Relative: 2 %
HCT: 32.4 % — ABNORMAL LOW (ref 39.0–52.0)
Hemoglobin: 9.8 g/dL — ABNORMAL LOW (ref 13.0–17.0)
Immature Granulocytes: 1 %
Lymphocytes Relative: 16 %
Lymphs Abs: 1 10*3/uL (ref 0.7–4.0)
MCH: 25 pg — ABNORMAL LOW (ref 26.0–34.0)
MCHC: 30.2 g/dL (ref 30.0–36.0)
MCV: 82.7 fL (ref 80.0–100.0)
Monocytes Absolute: 0.4 10*3/uL (ref 0.1–1.0)
Monocytes Relative: 6 %
Neutro Abs: 4.5 10*3/uL (ref 1.7–7.7)
Neutrophils Relative %: 74 %
Platelet Count: 124 10*3/uL — ABNORMAL LOW (ref 150–400)
RBC: 3.92 MIL/uL — ABNORMAL LOW (ref 4.22–5.81)
RDW: 23.6 % — ABNORMAL HIGH (ref 11.5–15.5)
WBC Count: 6.1 10*3/uL (ref 4.0–10.5)
nRBC: 0 % (ref 0.0–0.2)

## 2023-04-02 LAB — CEA (ACCESS): CEA (CHCC): 25.06 ng/mL — ABNORMAL HIGH (ref 0.00–5.00)

## 2023-04-02 LAB — TOTAL PROTEIN, URINE DIPSTICK: Protein, ur: NEGATIVE mg/dL

## 2023-04-02 MED ORDER — SODIUM CHLORIDE 0.9 % IV SOLN
1600.0000 mg/m2 | INTRAVENOUS | Status: DC
  Administered 2023-04-02: 3500 mg via INTRAVENOUS
  Filled 2023-04-02: qty 70

## 2023-04-02 MED ORDER — SODIUM CHLORIDE 0.9 % IV SOLN: Freq: Once | INTRAVENOUS | Status: AC

## 2023-04-02 MED ORDER — FLUOROURACIL CHEMO INJECTION 500 MG/10ML
200.0000 mg/m2 | Freq: Once | INTRAVENOUS | Status: AC
  Administered 2023-04-02: 450 mg via INTRAVENOUS
  Filled 2023-04-02: qty 9

## 2023-04-02 MED ORDER — ATROPINE SULFATE 1 MG/ML IV SOLN
0.5000 mg | Freq: Once | INTRAVENOUS | Status: AC
  Administered 2023-04-02: 0.5 mg via INTRAVENOUS
  Filled 2023-04-02: qty 1

## 2023-04-02 MED ORDER — SODIUM CHLORIDE 0.9 % IV SOLN
10.0000 mg | Freq: Once | INTRAVENOUS | Status: AC
  Administered 2023-04-02: 10 mg via INTRAVENOUS
  Filled 2023-04-02: qty 10

## 2023-04-02 MED ORDER — PALONOSETRON HCL INJECTION 0.25 MG/5ML
0.2500 mg | Freq: Once | INTRAVENOUS | Status: AC
  Administered 2023-04-02: 0.25 mg via INTRAVENOUS
  Filled 2023-04-02: qty 5

## 2023-04-02 MED ORDER — SODIUM CHLORIDE 0.9 % IV SOLN
200.0000 mg/m2 | Freq: Once | INTRAVENOUS | Status: AC
  Administered 2023-04-02: 444 mg via INTRAVENOUS
  Filled 2023-04-02: qty 22.2

## 2023-04-02 MED ORDER — SODIUM CHLORIDE 0.9% FLUSH: 10.0000 mL | INTRAVENOUS | Status: DC | PRN

## 2023-04-02 MED ORDER — SODIUM CHLORIDE 0.9 % IV SOLN
150.0000 mg | Freq: Once | INTRAVENOUS | Status: AC
  Administered 2023-04-02: 150 mg via INTRAVENOUS
  Filled 2023-04-02: qty 5

## 2023-04-02 MED ORDER — SODIUM CHLORIDE 0.9 % IV SOLN
180.0000 mg/m2 | Freq: Once | INTRAVENOUS | Status: AC
  Administered 2023-04-02: 400 mg via INTRAVENOUS
  Filled 2023-04-02: qty 15

## 2023-04-02 MED ORDER — SODIUM CHLORIDE 0.9 % IV SOLN
5.0000 mg/kg | Freq: Once | INTRAVENOUS | Status: AC
  Administered 2023-04-02: 500 mg via INTRAVENOUS
  Filled 2023-04-02: qty 16

## 2023-04-02 NOTE — Progress Notes (Signed)
[facility] OFFICE PROGRESS NOTE   Diagnosis: Colon cancer  INTERVAL HISTORY:   Anthony Calhoun completed another cycle of FOLFIRI/bevacizumab on 03/19/2023.  He developed nausea for several days following chemotherapy.  He complains of burning and sores on the tongue.  The burning is partially relieved with triamcinolone cream and Magic mouthwash.  No diarrhea.  He continues to have neuropathy symptoms in the extremities. He had flulike symptoms in the bones for 2 to 3 days after receiving G-CSF.  The pain was relieved with Tylenol. Objective:  Vital signs in last 24 hours:  Blood pressure 120/73, pulse 78, temperature 98.1 F (36.7 C), temperature source Oral, resp. rate 18, height 5\' 6"  (1.676 m), weight 243 lb 6.4 oz (110.4 kg), SpO2 98%.    HEENT: No thrush or ulcers Resp: Lungs clear bilaterally Cardio: Regular rate and rhythm GI: Nontender, no hepatosplenomegaly, no mass Vascular: No leg edema  Skin: Palms without erythema  Portacath/PICC-without erythema  Lab Results:  Lab Results  Component Value Date   WBC 2.4 (L) 03/19/2023   HGB 9.4 (L) 03/19/2023   HCT 31.5 (L) 03/19/2023   MCV 78.8 (L) 03/19/2023   PLT 150 03/19/2023   NEUTROABS 1.3 (L) 03/19/2023    CMP  Lab Results  Component Value Date   NA 136 03/19/2023   K 3.4 (L) 03/19/2023   CL 103 03/19/2023   CO2 24 03/19/2023   GLUCOSE 304 (H) 03/19/2023   BUN 10 03/19/2023   CREATININE 0.67 03/19/2023   CALCIUM 8.3 (L) 03/19/2023   PROT 6.9 03/19/2023   ALBUMIN 3.7 03/19/2023   AST 39 03/19/2023   ALT 38 03/19/2023   ALKPHOS 167 (H) 03/19/2023   BILITOT 0.8 03/19/2023   GFRNONAA >60 03/19/2023   GFRAA >60 05/10/2020    Lab Results  Component Value Date   CEA1 1.08 03/07/2021   CEA 32.43 (H) 03/19/2023    Medications: I have reviewed the patient's current medications.   Assessment/Plan: Moderately differentiated adenocarcinoma of the ascending colon, stage IIb (T4aN0), status  post a right colectomy 04/12/2020 Tumor invades the visceral peritoneum, 0/19 lymph nodes, no lymphovascular or perineural invasion, no tumor deposits, MSS, no loss of mismatch repair protein expression Cycle 1 adjuvant Xeloda 05/17/2020 Cycle 2 adjuvant Xeloda 06/07/2020, Xeloda discontinued after 12 days secondary to nausea and rectal bleeding Cycle 3 adjuvant Xeloda 06/28/2020, dose reduced to 2000 mg a.m., 1500 mg p.m. secondary to nausea (patient discontinued Xeloda on day 11 due to colonoscopy) Colonoscopy 07/09/2020-nodular mucosa at the colonic anastomosis, biopsied; patent end-to-side ileocolonic anastomosis characterized by healthy-appearing mucosa and visible sutures; nonbleeding internal hemorrhoids, biopsy with benign ulcerated anastomotic mucosa with granulation tissue Cycle 4 adjuvant Xeloda 07/19/2020, 2000 mg every morning and 1500 mg every afternoon Cycle 5 adjuvant Xeloda 08/09/2020 Cycle 6 adjuvant Xeloda 08/31/2019 Cycle 7 adjuvant Xeloda 09/20/2020 Cycle 8 adjuvant Xeloda 10/11/2020 CTs 03/07/2021-no evidence of recurrent disease, hepatic steatosis, slight wall thickening at the junction of the descending and horizontal duodenum Mild elevation of CEA 2023 12/01/2021-Guardant Reveal-ct DNA detected PET 12/30/2021-hypermetabolic right liver lesion, hypermetabolic area in a loop of mid small bowel with an SUV of 12.5, review of PET images at GI tumor conference 01/11/2022-small bowel uptake felt to be a benign finding MRI liver 01/10/2022-solitary 1.2 cm right liver lesion between segments 7 and 8 compatible with metastasis, subtle changes suggesting cirrhosis, hepatic steatosis CT enteroscopy 02/02/2022-small bowel loop with hypermetabolic activity on PET continues to have a bandlike density along the margin felt  to represent a diverticulum or adjacent nodal tissue.  Small bowel tumor not excluded.  3-4 mm left omental nodule 02/08/2022-biopsy and ablation of solitary liver lesion, adenocarcinoma  consistent with metastatic colorectal adenocarcinoma CTs 03/31/2022-unchanged circumstantial soft tissue thickening surrounding a loop of mid to distal small bowel with an adjacent nodular focus measuring 1.2 cm.,  Ablation cavity in segment 7, no other evidence of metastatic disease Diagnostic laparoscopy 05/26/2022-mid small bowel mass-resected, well to moderately differentiated adenocarcinoma, 0/2 lymph nodes, morphology consistent with a colon primary, tumor involved full-thickness including serosa with perineural and large vessel involvement; foundation 1-microsatellite stable, tumor mutation burden 2, K-ras Q61H CT abdomen/pelvis 07/10/2022-the right liver ablation defect is smaller, new subtle right liver lesion measuring 13 x 9 mm, enlarged lymph node in the right upper quadrant mesentery adjacent to surgical anastomosis PET 07/26/2022-new segment 6 liver lesion, enlarging hypermetabolic left omental lesion, there is another mildly hypermetabolic peritoneal implant, 7 mm omental nodule at the midline without hypermetabolism Cycle 1 FOLFOX 09/25/2022 Cycle 2 FOLFOX/bevacizumab 10/09/2022, 5-FU dose reduced secondary to mucositis Cycle 3 FOLFOX/bevacizumab 10/23/2022 Cycle 4 FOLFOX/bevacizumab 11/06/2022 Cycle 5 FOLFOX/bevacizumab 11/20/2022 Cycle 6 FOLFOX/bevacizumab 12/04/2022 CT abdomen/pelvis 12/14/2022-further contraction of the treated segment 7 lesion, slight enlargement of segment 6 lesion, no new liver lesion, increased size of ileocolic nodes and an omental lesion, stable small retroperitoneal nodes Cycle 7 FOLFOX/bevacizumab 12/18/2022 Cycle 8 FOLFOX/bevacizumab 01/02/2023, oxaliplatin dose reduced secondary to prolonged nausea and neuropathy symptoms, Emend added Cycle 9 FOLFOX/bevacizumab 01/16/2023, oxaliplatin discontinued secondary to neuropathy Cycle 1 FOLFIRI 02/19/2023, Avastin held due to recent dental extractions Cycle 2 FOLFIRI/Avastin 03/05/2023 Cycle 3 FOLFIRI/Avastin 03/19/2023, 5-FU  bolus held due to mucositis after cycle 2, Fulphila added Cycle 4 FOLFIRI/Avastin 04/02/2023, Fulphila, Emend Microcytic anemia secondary to #1-improved Colon polyps-sessile serrated adenoma of the descending colon, tubular adenomas with focal high-grade dysplasia of the transverse colon on colonoscopy 02/12/2020, tubular adenoma of the mid ascending colon on the surgical specimen 04/12/2020 Family history of colon cancer Depression Anxiety/panic attacks Hypertension Hyperlipidemia Nausea secondary to Xeloda?-Resolved Gastroesophageal reflux disease-improved following discontinuation of Xeloda Anemia, microcytic; ferritin 6 01/24/2022; stool positive for blood x3 01/25/2022; 02/02/2022 CT abdomen/pelvis enterography-bowel loop demonstrating hypermetabolic activity continues to have a bandlike density along its margin possibly representing a diverticulum or adjacent nodal tissue, difficult to exclude small bowel tumor, 3 x 4 mm left omental nodule or lymph node, known right hepatic lobe tumor is occult on CT. Venofer weekly x3 beginning 03/03/2022 Negative capsule endoscopy 03/09/2022 Venofer 03/03/2022 for 3 weekly doses Venofer 05/03/2022, 05/19/2022 Venofer 07/28/2022, 08/04/2022 12.  Anterior left neck nodule 03/17/2022-cyst?,  Lymph node? 13.  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  Oxaliplatin neuropathy-mild loss of vibratory sense 12/04/2022, 12/18/2022, 01/02/2023        Disposition: Anthony Calhoun has completed 3 cycles of FOLFIRI/Avastin.  He has tolerated the treatment well.  He will complete cycle 4 today.  He will again receive G-CSF support.  Emend will be added for delayed nausea.  He will continue triamcinolone paste for tongue soreness. The CEA was lower after cycle 2.  We  will follow-up on the CEA from today.  He will return for an office visit and chemotherapy in 2 weeks.  He will be referred for a restaging CT evaluation after cycle 5.  Thornton Papas,  MD  04/02/2023  8:51 AM

## 2023-04-02 NOTE — Patient Instructions (Addendum)
[place] [facility]   The chemotherapy medication bag should finish at 46 hours, 96 hours, or 7 days. For example, if your pump is scheduled for 46 hours and it was put on at 4:00 p.m., it should finish at 2:00 p.m. the day it is scheduled to come off regardless of your appointment time.     Estimated time to finish at 12:00 Wednesday, April 04, 2023.   If the display on your pump reads "Low Volume" and it is beeping, take the batteries out of the pump and come to the cancer center for it to be taken off.   If the pump alarms go off prior to the pump reading "Low Volume" then call [phone] and someone can assist you.  If the plunger comes out and the chemotherapy medication is leaking out, please use your home chemo spill kit to clean up the spill. Do NOT use paper towels or other household products.  If you have problems or questions regarding your pump, please call either [phone] (24 hours a day) or the cancer center Monday-Friday 8:00 a.m.- 4:30 p.m. at the clinic number and we will assist you. If you are unable to get assistance, then go to the nearest Emergency Department and ask the staff to contact the IV team for assistance.  Discharge Instructions: Thank you for choosing [facility] to provide your oncology and hematology care.   If you have a lab appointment with the Cancer Center, please go directly to the Cancer Center and check in at the registration area.   Wear comfortable clothing and clothing appropriate for easy access to any Portacath or PICC line.   We strive to give you quality time with your provider. You may need to reschedule your appointment if you arrive late (15 or more minutes).  Arriving late affects you and other patients whose appointments are after yours.  Also, if you miss three or more appointments without notifying the office, you may be dismissed from the clinic at the provider's discretion.      For  prescription refill requests, have your pharmacy contact our office and allow 72 hours for refills to be completed.    Today you received the following chemotherapy and/or immunotherapy agents Irinotecan, Leucovorin, Fluorouracil.      To help prevent nausea and vomiting after your treatment, we encourage you to take your nausea medication as directed.  BELOW ARE SYMPTOMS THAT SHOULD BE REPORTED IMMEDIATELY: *FEVER GREATER THAN 100.4 F (38 C) OR HIGHER *CHILLS OR SWEATING *NAUSEA AND VOMITING THAT IS NOT CONTROLLED WITH YOUR NAUSEA MEDICATION *UNUSUAL SHORTNESS OF BREATH *UNUSUAL BRUISING OR BLEEDING *URINARY PROBLEMS (pain or burning when urinating, or frequent urination) *BOWEL PROBLEMS (unusual diarrhea, constipation, pain near the anus) TENDERNESS IN MOUTH AND THROAT WITH OR WITHOUT PRESENCE OF ULCERS (sore throat, sores in mouth, or a toothache) UNUSUAL RASH, SWELLING OR PAIN  UNUSUAL VAGINAL DISCHARGE OR ITCHING   Items with * indicate a potential emergency and should be followed up as soon as possible or go to the Emergency Department if any problems should occur.  Please show the CHEMOTHERAPY ALERT CARD or IMMUNOTHERAPY ALERT CARD at check-in to the Emergency Department and triage nurse.  Should you have questions after your visit or need to cancel or reschedule your appointment, please contact [place] [facility]  Dept: [phone]  and follow the prompts.  Office hours are 8:00 a.m. to 4:30 p.m. Monday - Friday. Please note  that voicemails left after 4:00 p.m. may not be returned until the following business day.  We are closed weekends and major holidays. You have access to a nurse at all times for urgent questions. Please call the main number to the clinic Dept: [phone] and follow the prompts.   For any non-urgent questions, you may also contact your provider using MyChart. We now offer e-Visits for anyone 37 and older to request care online for  non-urgent symptoms. For details visit [email or web address].   Also download the MyChart app! Go to the app store, search "MyChart", open the app, select [facility], and log in with your MyChart username and password.  Irinotecan Injection What is this medication? IRINOTECAN (ir in oh TEE kan) treats some types of cancer. It works by slowing down the growth of cancer cells. This medicine may be used for other purposes; ask your health care provider or pharmacist if you have questions. COMMON BRAND NAME(S): Camptosar What should I tell my care team before I take this medication? They need to know if you have any of these conditions: Dehydration Diarrhea Infection, especially a viral infection, such as chickenpox, cold sores, herpes Liver disease Low blood cell levels (white cells, red cells, and platelets) Low levels of electrolytes, such as calcium, magnesium, or potassium in your blood Recent or ongoing radiation An unusual or allergic reaction to irinotecan, other medications, foods, dyes, or preservatives If you or your partner are pregnant or trying to get pregnant Breast-feeding How should I use this medication? This medication is injected into a vein. It is given by your care team in a hospital or clinic setting. Talk to your care team about the use of this medication in children. Special care may be needed. Overdosage: If you think you have taken too much of this medicine contact a poison control center or emergency room at once. NOTE: This medicine is only for you. Do not share this medicine with others. What if I miss a dose? Keep appointments for follow-up doses. It is important not to miss your dose. Call your care team if you are unable to keep an appointment. What may interact with this medication? Do not take this medication with any of the following: Cobicistat Itraconazole This medication may also interact with the following: Certain antibiotics, such as  clarithromycin, rifampin, rifabutin Certain antivirals for HIV or AIDS Certain medications for fungal infections, such as ketoconazole, posaconazole, voriconazole Certain medications for seizures, such as carbamazepine, phenobarbital, phenytoin Gemfibrozil Nefazodone St. John's wort This list may not describe all possible interactions. Give your health care provider a list of all the medicines, herbs, non-prescription drugs, or dietary supplements you use. Also tell them if you smoke, drink alcohol, or use illegal drugs. Some items may interact with your medicine. What should I watch for while using this medication? Your condition will be monitored carefully while you are receiving this medication. You may need blood work while taking this medication. This medication may make you feel generally unwell. This is not uncommon as chemotherapy can affect healthy cells as well as cancer cells. Report any side effects. Continue your course of treatment even though you feel ill unless your care team tells you to stop. This medication can cause serious side effects. To reduce the risk, your care team may give you other medications to take before receiving this one. Be sure to follow the directions from your care team. This medication may affect your coordination, reaction time, or judgement. Do not drive or  operate machinery until you know how this medication affects you. Sit up or stand slowly to reduce the risk of dizzy or fainting spells. Drinking alcohol with this medication can increase the risk of these side effects. This medication may increase your risk of getting an infection. Call your care team for advice if you get a fever, chills, sore throat, or other symptoms of a cold or flu. Do not treat yourself. Try to avoid being around people who are sick. Avoid taking medications that contain aspirin, acetaminophen, ibuprofen, naproxen, or ketoprofen unless instructed by your care team. These medications  may hide a fever. This medication may increase your risk to bruise or bleed. Call your care team if you notice any unusual bleeding. Be careful brushing or flossing your teeth or using a toothpick because you may get an infection or bleed more easily. If you have any dental work done, tell your dentist you are receiving this medication. Talk to your care team if you or your partner are pregnant or think either of you might be pregnant. This medication can cause serious birth defects if taken during pregnancy and for 6 months after the last dose. You will need a negative pregnancy test before starting this medication. Contraception is recommended while taking this medication and for 6 months after the last dose. Your care team can help you find the option that works for you. Do not father a child while taking this medication and for 3 months after the last dose. Use a condom for contraception during this time period. Do not breastfeed while taking this medication and for 7 days after the last dose. This medication may cause infertility. Talk to your care team if you are concerned about your fertility. What side effects may I notice from receiving this medication? Side effects that you should report to your care team as soon as possible: Allergic reactions--skin rash, itching, hives, swelling of the face, lips, tongue, or throat Dry cough, shortness of breath or trouble breathing Increased saliva or tears, increased sweating, stomach cramping, diarrhea, small pupils, unusual weakness or fatigue, slow heartbeat Infection--fever, chills, cough, sore throat, wounds that don't heal, pain or trouble when passing urine, general feeling of discomfort or being unwell Kidney injury--decrease in the amount of urine, swelling of the ankles, hands, or feet Low red blood cell level--unusual weakness or fatigue, dizziness, headache, trouble breathing Severe or prolonged diarrhea Unusual bruising or bleeding Side  effects that usually do not require medical attention (report to your care team if they continue or are bothersome): Constipation Diarrhea Hair loss Loss of appetite Nausea Stomach pain This list may not describe all possible side effects. Call your doctor for medical advice about side effects. You may report side effects to FDA at 1-800-FDA-1088. Where should I keep my medication? This medication is given in a hospital or clinic. It will not be stored at home. NOTE: This sheet is a summary. It may not cover all possible information. If you have questions about this medicine, talk to your doctor, pharmacist, or health care provider.  2024 Elsevier/Gold Standard (2021-12-26 00:00:00)  Leucovorin Injection What is this medication? LEUCOVORIN (loo koe VOR in) prevents side effects from certain medications, such as methotrexate. It works by increasing folate levels. This helps protect healthy cells in your body. It may also be used to treat anemia caused by low levels of folate. It can also be used with fluorouracil, a type of chemotherapy, to treat colorectal cancer. It works by increasing the  effects of fluorouracil in the body. This medicine may be used for other purposes; ask your health care provider or pharmacist if you have questions. What should I tell my care team before I take this medication? They need to know if you have any of these conditions: Anemia from low levels of vitamin B12 in the blood An unusual or allergic reaction to leucovorin, folic acid, other medications, foods, dyes, or preservatives Pregnant or trying to get pregnant Breastfeeding How should I use this medication? This medication is injected into a vein or a muscle. It is given by your care team in a hospital or clinic setting. Talk to your care team about the use of this medication in children. Special care may be needed. Overdosage: If you think you have taken too much of this medicine contact a poison control  center or emergency room at once. NOTE: This medicine is only for you. Do not share this medicine with others. What if I miss a dose? Keep appointments for follow-up doses. It is important not to miss your dose. Call your care team if you are unable to keep an appointment. What may interact with this medication? Capecitabine Fluorouracil Phenobarbital Phenytoin Primidone Trimethoprim;sulfamethoxazole This list may not describe all possible interactions. Give your health care provider a list of all the medicines, herbs, non-prescription drugs, or dietary supplements you use. Also tell them if you smoke, drink alcohol, or use illegal drugs. Some items may interact with your medicine. What should I watch for while using this medication? Your condition will be monitored carefully while you are receiving this medication. This medication may increase the side effects of 5-fluorouracil. Tell your care team if you have diarrhea or mouth sores that do not get better or that get worse. What side effects may I notice from receiving this medication? Side effects that you should report to your care team as soon as possible: Allergic reactions--skin rash, itching, hives, swelling of the face, lips, tongue, or throat This list may not describe all possible side effects. Call your doctor for medical advice about side effects. You may report side effects to FDA at 1-800-FDA-1088. Where should I keep my medication? This medication is given in a hospital or clinic. It will not be stored at home. NOTE: This sheet is a summary. It may not cover all possible information. If you have questions about this medicine, talk to your doctor, pharmacist, or health care provider.  2024 Elsevier/Gold Standard (2022-01-17 00:00:00)  Fluorouracil Injection What is this medication? FLUOROURACIL (flure oh YOOR a sil) treats some types of cancer. It works by slowing down the growth of cancer cells. This medicine may be used  for other purposes; ask your health care provider or pharmacist if you have questions. COMMON BRAND NAME(S): Adrucil What should I tell my care team before I take this medication? They need to know if you have any of these conditions: Blood disorders Dihydropyrimidine dehydrogenase (DPD) deficiency Infection, such as chickenpox, cold sores, herpes Kidney disease Liver disease Poor nutrition Recent or ongoing radiation therapy An unusual or allergic reaction to fluorouracil, other medications, foods, dyes, or preservatives If you or your partner are pregnant or trying to get pregnant Breast-feeding How should I use this medication? This medication is injected into a vein. It is administered by your care team in a hospital or clinic setting. Talk to your care team about the use of this medication in children. Special care may be needed. Overdosage: If you think you have taken  too much of this medicine contact a poison control center or emergency room at once. NOTE: This medicine is only for you. Do not share this medicine with others. What if I miss a dose? Keep appointments for follow-up doses. It is important not to miss your dose. Call your care team if you are unable to keep an appointment. What may interact with this medication? Do not take this medication with any of the following: Live virus vaccines This medication may also interact with the following: Medications that treat or prevent blood clots, such as warfarin, enoxaparin, dalteparin This list may not describe all possible interactions. Give your health care provider a list of all the medicines, herbs, non-prescription drugs, or dietary supplements you use. Also tell them if you smoke, drink alcohol, or use illegal drugs. Some items may interact with your medicine. What should I watch for while using this medication? Your condition will be monitored carefully while you are receiving this medication. This medication may make  you feel generally unwell. This is not uncommon as chemotherapy can affect healthy cells as well as cancer cells. Report any side effects. Continue your course of treatment even though you feel ill unless your care team tells you to stop. In some cases, you may be given additional medications to help with side effects. Follow all directions for their use. This medication may increase your risk of getting an infection. Call your care team for advice if you get a fever, chills, sore throat, or other symptoms of a cold or flu. Do not treat yourself. Try to avoid being around people who are sick. This medication may increase your risk to bruise or bleed. Call your care team if you notice any unusual bleeding. Be careful brushing or flossing your teeth or using a toothpick because you may get an infection or bleed more easily. If you have any dental work done, tell your dentist you are receiving this medication. Avoid taking medications that contain aspirin, acetaminophen, ibuprofen, naproxen, or ketoprofen unless instructed by your care team. These medications may hide a fever. Do not treat diarrhea with over the counter products. Contact your care team if you have diarrhea that lasts more than 2 days or if it is severe and watery. This medication can make you more sensitive to the sun. Keep out of the sun. If you cannot avoid being in the sun, wear protective clothing and sunscreen. Do not use sun lamps, tanning beds, or tanning booths. Talk to your care team if you or your partner wish to become pregnant or think you might be pregnant. This medication can cause serious birth defects if taken during pregnancy and for 3 months after the last dose. A reliable form of contraception is recommended while taking this medication and for 3 months after the last dose. Talk to your care team about effective forms of contraception. Do not father a child while taking this medication and for 3 months after the last dose.  Use a condom while having sex during this time period. Do not breastfeed while taking this medication. This medication may cause infertility. Talk to your care team if you are concerned about your fertility. What side effects may I notice from receiving this medication? Side effects that you should report to your care team as soon as possible: Allergic reactions--skin rash, itching, hives, swelling of the face, lips, tongue, or throat Heart attack--pain or tightness in the chest, shoulders, arms, or jaw, nausea, shortness of breath, cold or clammy skin,  feeling faint or lightheaded Heart failure--shortness of breath, swelling of the ankles, feet, or hands, sudden weight gain, unusual weakness or fatigue Heart rhythm changes--fast or irregular heartbeat, dizziness, feeling faint or lightheaded, chest pain, trouble breathing High ammonia level--unusual weakness or fatigue, confusion, loss of appetite, nausea, vomiting, seizures Infection--fever, chills, cough, sore throat, wounds that don't heal, pain or trouble when passing urine, general feeling of discomfort or being unwell Low red blood cell level--unusual weakness or fatigue, dizziness, headache, trouble breathing Pain, tingling, or numbness in the hands or feet, muscle weakness, change in vision, confusion or trouble speaking, loss of balance or coordination, trouble walking, seizures Redness, swelling, and blistering of the skin over hands and feet Severe or prolonged diarrhea Unusual bruising or bleeding Side effects that usually do not require medical attention (report to your care team if they continue or are bothersome): Dry skin Headache Increased tears Nausea Pain, redness, or swelling with sores inside the mouth or throat Sensitivity to light Vomiting This list may not describe all possible side effects. Call your doctor for medical advice about side effects. You may report side effects to FDA at 1-800-FDA-1088. Where should I  keep my medication? This medication is given in a hospital or clinic. It will not be stored at home. NOTE: This sheet is a summary. It may not cover all possible information. If you have questions about this medicine, talk to your doctor, pharmacist, or health care provider.  2024 Elsevier/Gold Standard (2021-12-20 00:00:00)

## 2023-04-02 NOTE — Progress Notes (Signed)
Patient seen by Dr. Truett Perna today  Vitals are within treatment parameters.  Labs reviewed by Dr. Truett Perna and are within treatment parameters.  Per physician team, patient is ready for treatment. Please note that modifications are being made to the treatment plan including per MD Truett Perna, Emend added to treatment today.

## 2023-04-03 ENCOUNTER — Encounter: Payer: Self-pay | Admitting: Oncology

## 2023-04-04 ENCOUNTER — Inpatient Hospital Stay: Payer: Medicare HMO

## 2023-04-04 ENCOUNTER — Other Ambulatory Visit: Payer: Self-pay

## 2023-04-04 VITALS — BP 105/69 | HR 61 | Temp 98.0°F | Resp 18

## 2023-04-04 DIAGNOSIS — F32A Depression, unspecified: Secondary | ICD-10-CM | POA: Diagnosis not present

## 2023-04-04 DIAGNOSIS — K219 Gastro-esophageal reflux disease without esophagitis: Secondary | ICD-10-CM | POA: Diagnosis not present

## 2023-04-04 DIAGNOSIS — E785 Hyperlipidemia, unspecified: Secondary | ICD-10-CM | POA: Diagnosis not present

## 2023-04-04 DIAGNOSIS — D509 Iron deficiency anemia, unspecified: Secondary | ICD-10-CM

## 2023-04-04 DIAGNOSIS — Z5111 Encounter for antineoplastic chemotherapy: Secondary | ICD-10-CM | POA: Diagnosis not present

## 2023-04-04 DIAGNOSIS — F410 Panic disorder [episodic paroxysmal anxiety] without agoraphobia: Secondary | ICD-10-CM | POA: Diagnosis not present

## 2023-04-04 DIAGNOSIS — I10 Essential (primary) hypertension: Secondary | ICD-10-CM | POA: Diagnosis not present

## 2023-04-04 DIAGNOSIS — C189 Malignant neoplasm of colon, unspecified: Secondary | ICD-10-CM

## 2023-04-04 DIAGNOSIS — Z79899 Other long term (current) drug therapy: Secondary | ICD-10-CM | POA: Diagnosis not present

## 2023-04-04 DIAGNOSIS — C182 Malignant neoplasm of ascending colon: Secondary | ICD-10-CM | POA: Diagnosis not present

## 2023-04-04 MED ORDER — PEGFILGRASTIM-JMDB 6 MG/0.6ML ~~LOC~~ SOSY
6.0000 mg | PREFILLED_SYRINGE | Freq: Once | SUBCUTANEOUS | Status: AC
  Administered 2023-04-04: 6 mg via SUBCUTANEOUS
  Filled 2023-04-04: qty 0.6

## 2023-04-04 MED ORDER — SODIUM CHLORIDE 0.9% FLUSH
10.0000 mL | INTRAVENOUS | Status: DC | PRN
  Administered 2023-04-04: 10 mL

## 2023-04-04 MED ORDER — SODIUM CHLORIDE 0.9 % IV SOLN: INTRAVENOUS | Status: DC

## 2023-04-04 MED ORDER — HEPARIN SOD (PORK) LOCK FLUSH 100 UNIT/ML IV SOLN
500.0000 [IU] | Freq: Once | INTRAVENOUS | Status: AC | PRN
  Administered 2023-04-04: 500 [IU]

## 2023-04-04 MED ORDER — SODIUM CHLORIDE 0.9 % IV SOLN
300.0000 mg | Freq: Once | INTRAVENOUS | Status: AC
  Administered 2023-04-04: 300 mg via INTRAVENOUS
  Filled 2023-04-04: qty 15
  Filled 2023-04-04: qty 300

## 2023-04-04 NOTE — Patient Instructions (Signed)
Iron Sucrose Injection What is this medication? IRON SUCROSE (EYE ern SOO krose) treats low levels of iron (iron deficiency anemia) in people with kidney disease. Iron is a mineral that plays an important role in making red blood cells, which carry oxygen from your lungs to the rest of your body. This medicine may be used for other purposes; ask your health care provider or pharmacist if you have questions. COMMON BRAND NAME(S): Venofer What should I tell my care team before I take this medication? They need to know if you have any of these conditions: Anemia not caused by low iron levels Heart disease High levels of iron in the blood Kidney disease Liver disease An unusual or allergic reaction to iron, other medications, foods, dyes, or preservatives Pregnant or trying to get pregnant Breastfeeding How should I use this medication? This medication is for infusion into a vein. It is given in a hospital or clinic setting. Talk to your care team about the use of this medication in children. While this medication may be prescribed for children as young as 2 years for selected conditions, precautions do apply. Overdosage: If you think you have taken too much of this medicine contact a poison control center or emergency room at once. NOTE: This medicine is only for you. Do not share this medicine with others. What if I miss a dose? Keep appointments for follow-up doses. It is important not to miss your dose. Call your care team if you are unable to keep an appointment. What may interact with this medication? Do not take this medication with any of the following: Deferoxamine Dimercaprol Other iron products This medication may also interact with the following: Chloramphenicol Deferasirox This list may not describe all possible interactions. Give your health care provider a list of all the medicines, herbs, non-prescription drugs, or dietary supplements you use. Also tell them if you smoke,  drink alcohol, or use illegal drugs. Some items may interact with your medicine. What should I watch for while using this medication? Visit your care team regularly. Tell your care team if your symptoms do not start to get better or if they get worse. You may need blood work done while you are taking this medication. You may need to follow a special diet. Talk to your care team. Foods that contain iron include: whole grains/cereals, dried fruits, beans, or peas, leafy green vegetables, and organ meats (liver, kidney). What side effects may I notice from receiving this medication? Side effects that you should report to your care team as soon as possible: Allergic reactions--skin rash, itching, hives, swelling of the face, lips, tongue, or throat Low blood pressure--dizziness, feeling faint or lightheaded, blurry vision Shortness of breath Side effects that usually do not require medical attention (report to your care team if they continue or are bothersome): Flushing Headache Joint pain Muscle pain Nausea Pain, redness, or irritation at injection site This list may not describe all possible side effects. Call your doctor for medical advice about side effects. You may report side effects to FDA at 1-800-FDA-1088. Where should I keep my medication? This medication is given in a hospital or clinic. It will not be stored at home. NOTE: This sheet is a summary. It may not cover all possible information. If you have questions about this medicine, talk to your doctor, pharmacist, or health care provider.  2024 Elsevier/Gold Standard (2023-01-19 00:00:00)   Pegfilgrastim Injection What is this medication? PEGFILGRASTIM (PEG fil gra stim) lowers the risk of infection  in people who are receiving chemotherapy. It works by Systems analyst make more white blood cells, which protects your body from infection. It may also be used to help people who have been exposed to high doses of radiation. This  medicine may be used for other purposes; ask your health care provider or pharmacist if you have questions. COMMON BRAND NAME(S): Cherly Hensen, Neulasta, Nyvepria, Stimufend, UDENYCA, UDENYCA ONBODY, Ziextenzo What should I tell my care team before I take this medication? They need to know if you have any of these conditions: Kidney disease Latex allergy Ongoing radiation therapy Sickle cell disease Skin reactions to acrylic adhesives (On-Body Injector only) An unusual or allergic reaction to pegfilgrastim, filgrastim, other medications, foods, dyes, or preservatives Pregnant or trying to get pregnant Breast-feeding How should I use this medication? This medication is for injection under the skin. If you get this medication at home, you will be taught how to prepare and give the pre-filled syringe or how to use the On-body Injector. Refer to the patient Instructions for Use for detailed instructions. Use exactly as directed. Tell your care team immediately if you suspect that the On-body Injector may not have performed as intended or if you suspect the use of the On-body Injector resulted in a missed or partial dose. It is important that you put your used needles and syringes in a special sharps container. Do not put them in a trash can. If you do not have a sharps container, call your pharmacist or care team to get one. Talk to your care team about the use of this medication in children. While this medication may be prescribed for selected conditions, precautions do apply. Overdosage: If you think you have taken too much of this medicine contact a poison control center or emergency room at once. NOTE: This medicine is only for you. Do not share this medicine with others. What if I miss a dose? It is important not to miss your dose. Call your care team if you miss your dose. If you miss a dose due to an On-body Injector failure or leakage, a new dose should be administered as soon as  possible using a single prefilled syringe for manual use. What may interact with this medication? Interactions have not been studied. This list may not describe all possible interactions. Give your health care provider a list of all the medicines, herbs, non-prescription drugs, or dietary supplements you use. Also tell them if you smoke, drink alcohol, or use illegal drugs. Some items may interact with your medicine. What should I watch for while using this medication? Your condition will be monitored carefully while you are receiving this medication. You may need blood work done while you are taking this medication. Talk to your care team about your risk of cancer. You may be more at risk for certain types of cancer if you take this medication. If you are going to need a MRI, CT scan, or other procedure, tell your care team that you are using this medication (On-Body Injector only). What side effects may I notice from receiving this medication? Side effects that you should report to your care team as soon as possible: Allergic reactions--skin rash, itching, hives, swelling of the face, lips, tongue, or throat Capillary leak syndrome--stomach or muscle pain, unusual weakness or fatigue, feeling faint or lightheaded, decrease in the amount of urine, swelling of the ankles, hands, or feet, trouble breathing High white blood cell level--fever, fatigue, trouble breathing, night sweats, change in  vision, weight loss Inflammation of the aorta--fever, fatigue, back, chest, or stomach pain, severe headache Kidney injury (glomerulonephritis)--decrease in the amount of urine, red or dark brown urine, foamy or bubbly urine, swelling of the ankles, hands, or feet Shortness of breath or trouble breathing Spleen injury--pain in upper left stomach or shoulder Unusual bruising or bleeding Side effects that usually do not require medical attention (report to your care team if they continue or are  bothersome): Bone pain Pain in the hands or feet This list may not describe all possible side effects. Call your doctor for medical advice about side effects. You may report side effects to FDA at 1-800-FDA-1088. Where should I keep my medication? Keep out of the reach of children. If you are using this medication at home, you will be instructed on how to store it. Throw away any unused medication after the expiration date on the label. NOTE: This sheet is a summary. It may not cover all possible information. If you have questions about this medicine, talk to your doctor, pharmacist, or health care provider.  2024 Elsevier/Gold Standard (2021-07-15 00:00:00)

## 2023-04-05 ENCOUNTER — Encounter: Payer: Self-pay | Admitting: Oncology

## 2023-04-05 ENCOUNTER — Other Ambulatory Visit: Payer: Self-pay | Admitting: Oncology

## 2023-04-05 ENCOUNTER — Other Ambulatory Visit: Payer: Self-pay | Admitting: *Deleted

## 2023-04-05 DIAGNOSIS — C189 Malignant neoplasm of colon, unspecified: Secondary | ICD-10-CM

## 2023-04-05 MED ORDER — FLUCONAZOLE 100 MG PO TABS
100.0000 mg | ORAL_TABLET | Freq: Every day | ORAL | 0 refills | Status: DC
Start: 2023-04-05 — End: 2023-05-07

## 2023-04-05 NOTE — Progress Notes (Signed)
Stonewall reports he has thrush. Sent script for fluconazole and reminded him to get his blood glucose levels down as well, as this can contribute to this.

## 2023-04-06 ENCOUNTER — Encounter: Payer: Self-pay | Admitting: Nurse Practitioner

## 2023-04-06 ENCOUNTER — Encounter: Payer: Self-pay | Admitting: Oncology

## 2023-04-09 ENCOUNTER — Encounter: Payer: Self-pay | Admitting: Oncology

## 2023-04-11 DIAGNOSIS — E1165 Type 2 diabetes mellitus with hyperglycemia: Secondary | ICD-10-CM | POA: Diagnosis not present

## 2023-04-11 DIAGNOSIS — K760 Fatty (change of) liver, not elsewhere classified: Secondary | ICD-10-CM | POA: Diagnosis not present

## 2023-04-11 DIAGNOSIS — E119 Type 2 diabetes mellitus without complications: Secondary | ICD-10-CM | POA: Diagnosis not present

## 2023-04-11 DIAGNOSIS — H5213 Myopia, bilateral: Secondary | ICD-10-CM | POA: Diagnosis not present

## 2023-04-11 DIAGNOSIS — I10 Essential (primary) hypertension: Secondary | ICD-10-CM | POA: Diagnosis not present

## 2023-04-11 DIAGNOSIS — E782 Mixed hyperlipidemia: Secondary | ICD-10-CM | POA: Diagnosis not present

## 2023-04-11 DIAGNOSIS — C189 Malignant neoplasm of colon, unspecified: Secondary | ICD-10-CM | POA: Diagnosis not present

## 2023-04-11 DIAGNOSIS — D509 Iron deficiency anemia, unspecified: Secondary | ICD-10-CM | POA: Diagnosis not present

## 2023-04-14 ENCOUNTER — Other Ambulatory Visit: Payer: Self-pay | Admitting: Oncology

## 2023-04-15 MED FILL — Fosaprepitant Dimeglumine For IV Infusion 150 MG (Base Eq): INTRAVENOUS | Qty: 5 | Status: AC

## 2023-04-15 MED FILL — Dexamethasone Sodium Phosphate Inj 100 MG/10ML: INTRAMUSCULAR | Qty: 1 | Status: AC

## 2023-04-16 ENCOUNTER — Inpatient Hospital Stay (HOSPITAL_BASED_OUTPATIENT_CLINIC_OR_DEPARTMENT_OTHER): Payer: Medicare HMO | Admitting: Nurse Practitioner

## 2023-04-16 ENCOUNTER — Inpatient Hospital Stay: Payer: Medicare HMO

## 2023-04-16 ENCOUNTER — Encounter: Payer: Self-pay | Admitting: Nurse Practitioner

## 2023-04-16 VITALS — BP 129/69 | HR 86 | Temp 98.1°F | Resp 18 | Ht 66.0 in | Wt 245.0 lb

## 2023-04-16 VITALS — BP 119/68 | HR 68 | Temp 98.0°F | Resp 18

## 2023-04-16 DIAGNOSIS — Z95828 Presence of other vascular implants and grafts: Secondary | ICD-10-CM

## 2023-04-16 DIAGNOSIS — C189 Malignant neoplasm of colon, unspecified: Secondary | ICD-10-CM

## 2023-04-16 DIAGNOSIS — D509 Iron deficiency anemia, unspecified: Secondary | ICD-10-CM | POA: Diagnosis not present

## 2023-04-16 DIAGNOSIS — E785 Hyperlipidemia, unspecified: Secondary | ICD-10-CM | POA: Diagnosis not present

## 2023-04-16 DIAGNOSIS — C787 Secondary malignant neoplasm of liver and intrahepatic bile duct: Secondary | ICD-10-CM | POA: Diagnosis not present

## 2023-04-16 DIAGNOSIS — Z79899 Other long term (current) drug therapy: Secondary | ICD-10-CM | POA: Diagnosis not present

## 2023-04-16 DIAGNOSIS — F410 Panic disorder [episodic paroxysmal anxiety] without agoraphobia: Secondary | ICD-10-CM | POA: Diagnosis not present

## 2023-04-16 DIAGNOSIS — C182 Malignant neoplasm of ascending colon: Secondary | ICD-10-CM | POA: Diagnosis not present

## 2023-04-16 DIAGNOSIS — F32A Depression, unspecified: Secondary | ICD-10-CM | POA: Diagnosis not present

## 2023-04-16 DIAGNOSIS — I10 Essential (primary) hypertension: Secondary | ICD-10-CM | POA: Diagnosis not present

## 2023-04-16 DIAGNOSIS — K219 Gastro-esophageal reflux disease without esophagitis: Secondary | ICD-10-CM | POA: Diagnosis not present

## 2023-04-16 DIAGNOSIS — Z5111 Encounter for antineoplastic chemotherapy: Secondary | ICD-10-CM | POA: Diagnosis not present

## 2023-04-16 LAB — CMP (CANCER CENTER ONLY)
ALT: 27 U/L (ref 0–44)
AST: 24 U/L (ref 15–41)
Albumin: 3.7 g/dL (ref 3.5–5.0)
Alkaline Phosphatase: 180 U/L — ABNORMAL HIGH (ref 38–126)
Anion gap: 10 (ref 5–15)
BUN: 10 mg/dL (ref 6–20)
CO2: 24 mmol/L (ref 22–32)
Calcium: 8.5 mg/dL — ABNORMAL LOW (ref 8.9–10.3)
Chloride: 103 mmol/L (ref 98–111)
Creatinine: 0.7 mg/dL (ref 0.61–1.24)
GFR, Estimated: >60 mL/min (ref >60)
Glucose, Bld: 293 mg/dL — ABNORMAL HIGH (ref 70–99)
Potassium: 3.6 mmol/L (ref 3.5–5.1)
Sodium: 137 mmol/L (ref 135–145)
Total Bilirubin: 0.7 mg/dL (ref 0.3–1.2)
Total Protein: 6.8 g/dL (ref 6.5–8.1)

## 2023-04-16 LAB — CBC WITH DIFFERENTIAL (CANCER CENTER ONLY)
Abs Immature Granulocytes: 0.07 10*3/uL (ref 0.00–0.07)
Basophils Absolute: 0.1 10*3/uL (ref 0.0–0.1)
Basophils Relative: 1 %
Eosinophils Absolute: 0.1 10*3/uL (ref 0.0–0.5)
Eosinophils Relative: 2 %
HCT: 31.8 % — ABNORMAL LOW (ref 39.0–52.0)
Hemoglobin: 9.6 g/dL — ABNORMAL LOW (ref 13.0–17.0)
Immature Granulocytes: 1 %
Lymphocytes Relative: 16 %
Lymphs Abs: 0.9 10*3/uL (ref 0.7–4.0)
MCH: 25.9 pg — ABNORMAL LOW (ref 26.0–34.0)
MCHC: 30.2 g/dL (ref 30.0–36.0)
MCV: 85.7 fL (ref 80.0–100.0)
Monocytes Absolute: 0.3 10*3/uL (ref 0.1–1.0)
Monocytes Relative: 6 %
Neutro Abs: 3.9 10*3/uL (ref 1.7–7.7)
Neutrophils Relative %: 74 %
Platelet Count: 132 10*3/uL — ABNORMAL LOW (ref 150–400)
RBC: 3.71 MIL/uL — ABNORMAL LOW (ref 4.22–5.81)
RDW: 25.4 % — ABNORMAL HIGH (ref 11.5–15.5)
WBC Count: 5.3 10*3/uL (ref 4.0–10.5)
nRBC: 0 % (ref 0.0–0.2)

## 2023-04-16 LAB — CEA (ACCESS): CEA (CHCC): 20.55 ng/mL — ABNORMAL HIGH (ref 0.00–5.00)

## 2023-04-16 MED ORDER — FLUOROURACIL CHEMO INJECTION 500 MG/10ML
200.0000 mg/m2 | Freq: Once | INTRAVENOUS | Status: AC
  Administered 2023-04-16: 450 mg via INTRAVENOUS
  Filled 2023-04-16: qty 9

## 2023-04-16 MED ORDER — SODIUM CHLORIDE 0.9 % IV SOLN
200.0000 mg/m2 | Freq: Once | INTRAVENOUS | Status: AC
  Administered 2023-04-16: 444 mg via INTRAVENOUS
  Filled 2023-04-16: qty 22.2

## 2023-04-16 MED ORDER — SODIUM CHLORIDE 0.9% FLUSH
10.0000 mL | INTRAVENOUS | Status: DC | PRN
  Administered 2023-04-16: 10 mL via INTRAVENOUS

## 2023-04-16 MED ORDER — SODIUM CHLORIDE 0.9 % IV SOLN
1600.0000 mg/m2 | INTRAVENOUS | Status: DC
  Administered 2023-04-16: 3500 mg via INTRAVENOUS
  Filled 2023-04-16: qty 70

## 2023-04-16 MED ORDER — SODIUM CHLORIDE 0.9 % IV SOLN
10.0000 mg | Freq: Once | INTRAVENOUS | Status: AC
  Administered 2023-04-16: 10 mg via INTRAVENOUS
  Filled 2023-04-16: qty 10

## 2023-04-16 MED ORDER — SODIUM CHLORIDE 0.9 % IV SOLN
150.0000 mg | Freq: Once | INTRAVENOUS | Status: AC
  Administered 2023-04-16: 150 mg via INTRAVENOUS
  Filled 2023-04-16: qty 150

## 2023-04-16 MED ORDER — SODIUM CHLORIDE 0.9 % IV SOLN: Freq: Once | INTRAVENOUS | Status: AC

## 2023-04-16 MED ORDER — PALONOSETRON HCL INJECTION 0.25 MG/5ML
0.2500 mg | Freq: Once | INTRAVENOUS | Status: AC
  Administered 2023-04-16: 0.25 mg via INTRAVENOUS
  Filled 2023-04-16: qty 5

## 2023-04-16 MED ORDER — SODIUM CHLORIDE 0.9 % IV SOLN
5.0000 mg/kg | Freq: Once | INTRAVENOUS | Status: AC
  Administered 2023-04-16: 500 mg via INTRAVENOUS
  Filled 2023-04-16: qty 16

## 2023-04-16 MED ORDER — SODIUM CHLORIDE 0.9 % IV SOLN
180.0000 mg/m2 | Freq: Once | INTRAVENOUS | Status: AC
  Administered 2023-04-16: 400 mg via INTRAVENOUS
  Filled 2023-04-16: qty 20

## 2023-04-16 MED ORDER — ATROPINE SULFATE 1 MG/ML IV SOLN
0.5000 mg | Freq: Once | INTRAVENOUS | Status: AC
  Administered 2023-04-16: 0.5 mg via INTRAVENOUS
  Filled 2023-04-16: qty 1

## 2023-04-16 NOTE — Patient Instructions (Signed)
[facility]   Discharge Instructions: Thank you for choosing [facility] to provide your oncology and hematology care.   If you have a lab appointment with the Cancer Center, please go directly to the Cancer Center and check in at the registration area.   Wear comfortable clothing and clothing appropriate for easy access to any Portacath or PICC line.   We strive to give you quality time with your provider. You may need to reschedule your appointment if you arrive late (15 or more minutes).  Arriving late affects you and other patients whose appointments are after yours.  Also, if you miss three or more appointments without notifying the office, you may be dismissed from the clinic at the provider's discretion.      For prescription refill requests, have your pharmacy contact our office and allow 72 hours for refills to be completed.    Today you received the following chemotherapy and/or immunotherapy agents Bevacizumab-awwb (MVASI), Irinotecan (CAMPTOSAR), Leucovorin & Flourouracil (ADRUCIL).      To help prevent nausea and vomiting after your treatment, we encourage you to take your nausea medication as directed.  BELOW ARE SYMPTOMS THAT SHOULD BE REPORTED IMMEDIATELY: *FEVER GREATER THAN 100.4 F (38 C) OR HIGHER *CHILLS OR SWEATING *NAUSEA AND VOMITING THAT IS NOT CONTROLLED WITH YOUR NAUSEA MEDICATION *UNUSUAL SHORTNESS OF BREATH *UNUSUAL BRUISING OR BLEEDING *URINARY PROBLEMS (pain or burning when urinating, or frequent urination) *BOWEL PROBLEMS (unusual diarrhea, constipation, pain near the anus) TENDERNESS IN MOUTH AND THROAT WITH OR WITHOUT PRESENCE OF ULCERS (sore throat, sores in mouth, or a toothache) UNUSUAL RASH, SWELLING OR PAIN  UNUSUAL VAGINAL DISCHARGE OR ITCHING   Items with * indicate a potential emergency and should be followed up as soon as possible or go to the Emergency Department if any problems should  occur.  Please show the CHEMOTHERAPY ALERT CARD or IMMUNOTHERAPY ALERT CARD at check-in to the Emergency Department and triage nurse.  Should you have questions after your visit or need to cancel or reschedule your appointment, please contact [facility]  Dept: [phone]  and follow the prompts.  Office hours are 8:00 a.m. to 4:30 p.m. Monday - Friday. Please note that voicemails left after 4:00 p.m. may not be returned until the following business day.  We are closed weekends and major holidays. You have access to a nurse at all times for urgent questions. Please call the main number to the clinic Dept: [phone] and follow the prompts.   For any non-urgent questions, you may also contact your provider using MyChart. We now offer e-Visits for anyone 56 and older to request care online for non-urgent symptoms. For details visit [email or web address].   Also download the MyChart app! Go to the app store, search "MyChart", open the app, select [place], and log in with your MyChart username and password.  Bevacizumab Injection What is this medication? BEVACIZUMAB (be va SIZ yoo mab) treats some types of cancer. It works by blocking a protein that causes cancer cells to grow and multiply. This helps to slow or stop the spread of cancer cells. It is a monoclonal antibody. This medicine may be used for other purposes; ask your health care provider or pharmacist if you have questions. COMMON BRAND NAME(S): Alymsys, Avastin, MVASI, Omer Jack What should I tell my care team before I take this medication? They need to know if you have any of these conditions: Blood clots Coughing up  blood Having or recent surgery Heart failure High blood pressure History of a connection between 2 or more body parts that do not usually connect (fistula) History of a tear in your stomach or intestines Protein in your urine An unusual or allergic reaction to bevacizumab,  other medications, foods, dyes, or preservatives Pregnant or trying to get pregnant Breast-feeding How should I use this medication? This medication is injected into a vein. It is given by your care team in a hospital or clinic setting. Talk to your care team the use of this medication in children. Special care may be needed. Overdosage: If you think you have taken too much of this medicine contact a poison control center or emergency room at once. NOTE: This medicine is only for you. Do not share this medicine with others. What if I miss a dose? Keep appointments for follow-up doses. It is important not to miss your dose. Call your care team if you are unable to keep an appointment. What may interact with this medication? Interactions are not expected. This list may not describe all possible interactions. Give your health care provider a list of all the medicines, herbs, non-prescription drugs, or dietary supplements you use. Also tell them if you smoke, drink alcohol, or use illegal drugs. Some items may interact with your medicine. What should I watch for while using this medication? Your condition will be monitored carefully while you are receiving this medication. You may need blood work while taking this medication. This medication may make you feel generally unwell. This is not uncommon as chemotherapy can affect healthy cells as well as cancer cells. Report any side effects. Continue your course of treatment even though you feel ill unless your care team tells you to stop. This medication may increase your risk to bruise or bleed. Call your care team if you notice any unusual bleeding. Before having surgery, talk to your care team to make sure it is ok. This medication can increase the risk of poor healing of your surgical site or wound. You will need to stop this medication for 28 days before surgery. After surgery, wait at least 28 days before restarting this medication. Make sure the  surgical site or wound is healed enough before restarting this medication. Talk to your care team if questions. Talk to your care team if you may be pregnant. Serious birth defects can occur if you take this medication during pregnancy and for 6 months after the last dose. Contraception is recommended while taking this medication and for 6 months after the last dose. Your care team can help you find the option that works for you. Do not breastfeed while taking this medication and for 6 months after the last dose. This medication can cause infertility. Talk to your care team if you are concerned about your fertility. What side effects may I notice from receiving this medication? Side effects that you should report to your care team as soon as possible: Allergic reactions--skin rash, itching, hives, swelling of the face, lips, tongue, or throat Bleeding--bloody or black, tar-like stools, vomiting blood or brown material that looks like coffee grounds, red or dark brown urine, small red or purple spots on skin, unusual bruising or bleeding Blood clot--pain, swelling, or warmth in the leg, shortness of breath, chest pain Heart attack--pain or tightness in the chest, shoulders, arms, or jaw, nausea, shortness of breath, cold or clammy skin, feeling faint or lightheaded Heart failure--shortness of breath, swelling of the ankles, feet, or hands,  sudden weight gain, unusual weakness or fatigue Increase in blood pressure Infection--fever, chills, cough, sore throat, wounds that don't heal, pain or trouble when passing urine, general feeling of discomfort or being unwell Infusion reactions--chest pain, shortness of breath or trouble breathing, feeling faint or lightheaded Kidney injury--decrease in the amount of urine, swelling of the ankles, hands, or feet Stomach pain that is severe, does not go away, or gets worse Stroke--sudden numbness or weakness of the face, arm, or leg, trouble speaking, confusion,  trouble walking, loss of balance or coordination, dizziness, severe headache, change in vision Sudden and severe headache, confusion, change in vision, seizures, which may be signs of posterior reversible encephalopathy syndrome (PRES) Side effects that usually do not require medical attention (report to your care team if they continue or are bothersome): Back pain Change in taste Diarrhea Dry skin Increased tears Nosebleed This list may not describe all possible side effects. Call your doctor for medical advice about side effects. You may report side effects to FDA at 1-800-FDA-1088. Where should I keep my medication? This medication is given in a hospital or clinic. It will not be stored at home. NOTE: This sheet is a summary. It may not cover all possible information. If you have questions about this medicine, talk to your doctor, pharmacist, or health care provider.  2024 Elsevier/Gold Standard (2021-12-30 00:00:00)  Irinotecan Injection What is this medication? IRINOTECAN (ir in oh TEE kan) treats some types of cancer. It works by slowing down the growth of cancer cells. This medicine may be used for other purposes; ask your health care provider or pharmacist if you have questions. COMMON BRAND NAME(S): Camptosar What should I tell my care team before I take this medication? They need to know if you have any of these conditions: Dehydration Diarrhea Infection, especially a viral infection, such as chickenpox, cold sores, herpes Liver disease Low blood cell levels (white cells, red cells, and platelets) Low levels of electrolytes, such as calcium, magnesium, or potassium in your blood Recent or ongoing radiation An unusual or allergic reaction to irinotecan, other medications, foods, dyes, or preservatives If you or your partner are pregnant or trying to get pregnant Breast-feeding How should I use this medication? This medication is injected into a vein. It is given by your  care team in a hospital or clinic setting. Talk to your care team about the use of this medication in children. Special care may be needed. Overdosage: If you think you have taken too much of this medicine contact a poison control center or emergency room at once. NOTE: This medicine is only for you. Do not share this medicine with others. What if I miss a dose? Keep appointments for follow-up doses. It is important not to miss your dose. Call your care team if you are unable to keep an appointment. What may interact with this medication? Do not take this medication with any of the following: Cobicistat Itraconazole This medication may also interact with the following: Certain antibiotics, such as clarithromycin, rifampin, rifabutin Certain antivirals for HIV or AIDS Certain medications for fungal infections, such as ketoconazole, posaconazole, voriconazole Certain medications for seizures, such as carbamazepine, phenobarbital, phenytoin Gemfibrozil Nefazodone St. John's wort This list may not describe all possible interactions. Give your health care provider a list of all the medicines, herbs, non-prescription drugs, or dietary supplements you use. Also tell them if you smoke, drink alcohol, or use illegal drugs. Some items may interact with your medicine. What should I watch  for while using this medication? Your condition will be monitored carefully while you are receiving this medication. You may need blood work while taking this medication. This medication may make you feel generally unwell. This is not uncommon as chemotherapy can affect healthy cells as well as cancer cells. Report any side effects. Continue your course of treatment even though you feel ill unless your care team tells you to stop. This medication can cause serious side effects. To reduce the risk, your care team may give you other medications to take before receiving this one. Be sure to follow the directions from your  care team. This medication may affect your coordination, reaction time, or judgement. Do not drive or operate machinery until you know how this medication affects you. Sit up or stand slowly to reduce the risk of dizzy or fainting spells. Drinking alcohol with this medication can increase the risk of these side effects. This medication may increase your risk of getting an infection. Call your care team for advice if you get a fever, chills, sore throat, or other symptoms of a cold or flu. Do not treat yourself. Try to avoid being around people who are sick. Avoid taking medications that contain aspirin, acetaminophen, ibuprofen, naproxen, or ketoprofen unless instructed by your care team. These medications may hide a fever. This medication may increase your risk to bruise or bleed. Call your care team if you notice any unusual bleeding. Be careful brushing or flossing your teeth or using a toothpick because you may get an infection or bleed more easily. If you have any dental work done, tell your dentist you are receiving this medication. Talk to your care team if you or your partner are pregnant or think either of you might be pregnant. This medication can cause serious birth defects if taken during pregnancy and for 6 months after the last dose. You will need a negative pregnancy test before starting this medication. Contraception is recommended while taking this medication and for 6 months after the last dose. Your care team can help you find the option that works for you. Do not father a child while taking this medication and for 3 months after the last dose. Use a condom for contraception during this time period. Do not breastfeed while taking this medication and for 7 days after the last dose. This medication may cause infertility. Talk to your care team if you are concerned about your fertility. What side effects may I notice from receiving this medication? Side effects that you should report to  your care team as soon as possible: Allergic reactions--skin rash, itching, hives, swelling of the face, lips, tongue, or throat Dry cough, shortness of breath or trouble breathing Increased saliva or tears, increased sweating, stomach cramping, diarrhea, small pupils, unusual weakness or fatigue, slow heartbeat Infection--fever, chills, cough, sore throat, wounds that don't heal, pain or trouble when passing urine, general feeling of discomfort or being unwell Kidney injury--decrease in the amount of urine, swelling of the ankles, hands, or feet Low red blood cell level--unusual weakness or fatigue, dizziness, headache, trouble breathing Severe or prolonged diarrhea Unusual bruising or bleeding Side effects that usually do not require medical attention (report to your care team if they continue or are bothersome): Constipation Diarrhea Hair loss Loss of appetite Nausea Stomach pain This list may not describe all possible side effects. Call your doctor for medical advice about side effects. You may report side effects to FDA at 1-800-FDA-1088. Where should I keep my medication?  This medication is given in a hospital or clinic. It will not be stored at home. NOTE: This sheet is a summary. It may not cover all possible information. If you have questions about this medicine, talk to your doctor, pharmacist, or health care provider.  2024 Elsevier/Gold Standard (2021-12-26 00:00:00)   Leucovorin Injection What is this medication? LEUCOVORIN (loo koe VOR in) prevents side effects from certain medications, such as methotrexate. It works by increasing folate levels. This helps protect healthy cells in your body. It may also be used to treat anemia caused by low levels of folate. It can also be used with fluorouracil, a type of chemotherapy, to treat colorectal cancer. It works by increasing the effects of fluorouracil in the body. This medicine may be used for other purposes; ask your health care  provider or pharmacist if you have questions. What should I tell my care team before I take this medication? They need to know if you have any of these conditions: Anemia from low levels of vitamin B12 in the blood An unusual or allergic reaction to leucovorin, folic acid, other medications, foods, dyes, or preservatives Pregnant or trying to get pregnant Breastfeeding How should I use this medication? This medication is injected into a vein or a muscle. It is given by your care team in a hospital or clinic setting. Talk to your care team about the use of this medication in children. Special care may be needed. Overdosage: If you think you have taken too much of this medicine contact a poison control center or emergency room at once. NOTE: This medicine is only for you. Do not share this medicine with others. What if I miss a dose? Keep appointments for follow-up doses. It is important not to miss your dose. Call your care team if you are unable to keep an appointment. What may interact with this medication? Capecitabine Fluorouracil Phenobarbital Phenytoin Primidone Trimethoprim;sulfamethoxazole This list may not describe all possible interactions. Give your health care provider a list of all the medicines, herbs, non-prescription drugs, or dietary supplements you use. Also tell them if you smoke, drink alcohol, or use illegal drugs. Some items may interact with your medicine. What should I watch for while using this medication? Your condition will be monitored carefully while you are receiving this medication. This medication may increase the side effects of 5-fluorouracil. Tell your care team if you have diarrhea or mouth sores that do not get better or that get worse. What side effects may I notice from receiving this medication? Side effects that you should report to your care team as soon as possible: Allergic reactions--skin rash, itching, hives, swelling of the face, lips, tongue,  or throat This list may not describe all possible side effects. Call your doctor for medical advice about side effects. You may report side effects to FDA at 1-800-FDA-1088. Where should I keep my medication? This medication is given in a hospital or clinic. It will not be stored at home. NOTE: This sheet is a summary. It may not cover all possible information. If you have questions about this medicine, talk to your doctor, pharmacist, or health care provider.  2024 Elsevier/Gold Standard (2022-01-17 00:00:00)   Fluorouracil Injection What is this medication? FLUOROURACIL (flure oh YOOR a sil) treats some types of cancer. It works by slowing down the growth of cancer cells. This medicine may be used for other purposes; ask your health care provider or pharmacist if you have questions. COMMON BRAND NAME(S): Adrucil What should  I tell my care team before I take this medication? They need to know if you have any of these conditions: Blood disorders Dihydropyrimidine dehydrogenase (DPD) deficiency Infection, such as chickenpox, cold sores, herpes Kidney disease Liver disease Poor nutrition Recent or ongoing radiation therapy An unusual or allergic reaction to fluorouracil, other medications, foods, dyes, or preservatives If you or your partner are pregnant or trying to get pregnant Breast-feeding How should I use this medication? This medication is injected into a vein. It is administered by your care team in a hospital or clinic setting. Talk to your care team about the use of this medication in children. Special care may be needed. Overdosage: If you think you have taken too much of this medicine contact a poison control center or emergency room at once. NOTE: This medicine is only for you. Do not share this medicine with others. What if I miss a dose? Keep appointments for follow-up doses. It is important not to miss your dose. Call your care team if you are unable to keep an  appointment. What may interact with this medication? Do not take this medication with any of the following: Live virus vaccines This medication may also interact with the following: Medications that treat or prevent blood clots, such as warfarin, enoxaparin, dalteparin This list may not describe all possible interactions. Give your health care provider a list of all the medicines, herbs, non-prescription drugs, or dietary supplements you use. Also tell them if you smoke, drink alcohol, or use illegal drugs. Some items may interact with your medicine. What should I watch for while using this medication? Your condition will be monitored carefully while you are receiving this medication. This medication may make you feel generally unwell. This is not uncommon as chemotherapy can affect healthy cells as well as cancer cells. Report any side effects. Continue your course of treatment even though you feel ill unless your care team tells you to stop. In some cases, you may be given additional medications to help with side effects. Follow all directions for their use. This medication may increase your risk of getting an infection. Call your care team for advice if you get a fever, chills, sore throat, or other symptoms of a cold or flu. Do not treat yourself. Try to avoid being around people who are sick. This medication may increase your risk to bruise or bleed. Call your care team if you notice any unusual bleeding. Be careful brushing or flossing your teeth or using a toothpick because you may get an infection or bleed more easily. If you have any dental work done, tell your dentist you are receiving this medication. Avoid taking medications that contain aspirin, acetaminophen, ibuprofen, naproxen, or ketoprofen unless instructed by your care team. These medications may hide a fever. Do not treat diarrhea with over the counter products. Contact your care team if you have diarrhea that lasts more than 2  days or if it is severe and watery. This medication can make you more sensitive to the sun. Keep out of the sun. If you cannot avoid being in the sun, wear protective clothing and sunscreen. Do not use sun lamps, tanning beds, or tanning booths. Talk to your care team if you or your partner wish to become pregnant or think you might be pregnant. This medication can cause serious birth defects if taken during pregnancy and for 3 months after the last dose. A reliable form of contraception is recommended while taking this medication and for 3  months after the last dose. Talk to your care team about effective forms of contraception. Do not father a child while taking this medication and for 3 months after the last dose. Use a condom while having sex during this time period. Do not breastfeed while taking this medication. This medication may cause infertility. Talk to your care team if you are concerned about your fertility. What side effects may I notice from receiving this medication? Side effects that you should report to your care team as soon as possible: Allergic reactions--skin rash, itching, hives, swelling of the face, lips, tongue, or throat Heart attack--pain or tightness in the chest, shoulders, arms, or jaw, nausea, shortness of breath, cold or clammy skin, feeling faint or lightheaded Heart failure--shortness of breath, swelling of the ankles, feet, or hands, sudden weight gain, unusual weakness or fatigue Heart rhythm changes--fast or irregular heartbeat, dizziness, feeling faint or lightheaded, chest pain, trouble breathing High ammonia level--unusual weakness or fatigue, confusion, loss of appetite, nausea, vomiting, seizures Infection--fever, chills, cough, sore throat, wounds that don't heal, pain or trouble when passing urine, general feeling of discomfort or being unwell Low red blood cell level--unusual weakness or fatigue, dizziness, headache, trouble breathing Pain, tingling, or  numbness in the hands or feet, muscle weakness, change in vision, confusion or trouble speaking, loss of balance or coordination, trouble walking, seizures Redness, swelling, and blistering of the skin over hands and feet Severe or prolonged diarrhea Unusual bruising or bleeding Side effects that usually do not require medical attention (report to your care team if they continue or are bothersome): Dry skin Headache Increased tears Nausea Pain, redness, or swelling with sores inside the mouth or throat Sensitivity to light Vomiting This list may not describe all possible side effects. Call your doctor for medical advice about side effects. You may report side effects to FDA at 1-800-FDA-1088. Where should I keep my medication? This medication is given in a hospital or clinic. It will not be stored at home. NOTE: This sheet is a summary. It may not cover all possible information. If you have questions about this medicine, talk to your doctor, pharmacist, or health care provider.  2024 Elsevier/Gold Standard (2021-12-20 00:00:00)   The chemotherapy medication bag should finish at 46 hours, 96 hours, or 7 days. For example, if your pump is scheduled for 46 hours and it was put on at 4:00 p.m., it should finish at 2:00 p.m. the day it is scheduled to come off regardless of your appointment time.     Estimated time to finish at 11:30 a.m. on Wednesday 04/18/2023.   If the display on your pump reads "Low Volume" and it is beeping, take the batteries out of the pump and come to the cancer center for it to be taken off.   If the pump alarms go off prior to the pump reading "Low Volume" then call [phone] and someone can assist you.  If the plunger comes out and the chemotherapy medication is leaking out, please use your home chemo spill kit to clean up the spill. Do NOT use paper towels or other household products.  If you have problems or questions regarding your pump, please call either  [phone] (24 hours a day) or the cancer center Monday-Friday 8:00 a.m.- 4:30 p.m. at the clinic number and we will assist you. If you are unable to get assistance, then go to the nearest Emergency Department and ask the staff to contact the IV team for assistance.

## 2023-04-16 NOTE — Progress Notes (Signed)
Patient seen by Lonna Cobb NP today  Vitals are within treatment parameters.  Labs reviewed by Lonna Cobb NP CBC diff reviewed and within treatment parameters, CMP pending.  Per physician team, patient is ready for treatment and there are NO modifications to the treatment plan. Per Lonna Cobb, NP wait for CMP results prior to starting tx.

## 2023-04-16 NOTE — Progress Notes (Signed)
[facility] OFFICE PROGRESS NOTE   Diagnosis: Colon cancer  INTERVAL HISTORY:   Anthony Calhoun returns as scheduled.  He completed another cycle of FOLFIRI/bevacizumab 04/02/2023.  He had less nausea.  A few "bumps" at the tip of the tongue.  No ulcerations.  Noted a small amount of blood with a bowel movement last week.  None since.  No diarrhea.  He recently rolled out of bed while dreaming.  He reports pain at the left mid to low back intermittently.  Objective:  Vital signs in last 24 hours:  Blood pressure 129/69, pulse 86, temperature 98.1 F (36.7 C), temperature source Oral, resp. rate 18, height 5\' 6"  (1.676 m), weight 245 lb (111.1 kg), SpO2 98%.    HEENT: No thrush or ulcers. Resp: Lungs clear bilaterally. Cardio: Regular rate and rhythm. GI: No hepatosplenomegaly. Vascular: No leg edema. Skin: Palms without erythema. Port-A-Cath without erythema.  Lab Results:  Lab Results  Component Value Date   WBC 5.3 04/16/2023   HGB 9.6 (L) 04/16/2023   HCT 31.8 (L) 04/16/2023   MCV 85.7 04/16/2023   PLT 132 (L) 04/16/2023   NEUTROABS 3.9 04/16/2023    Imaging:  No results found.  Medications: I have reviewed the patient's current medications.  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 secondary to nausea (patient discontinued Xeloda on day 11 due to colonoscopy) Colonoscopy 07/09/2020-nodular mucosa at the colonic anastomosis, biopsied; patent end-to-side ileocolonic  anastomosis characterized by healthy-appearing mucosa and visible sutures; nonbleeding internal hemorrhoids, biopsy with benign ulcerated anastomotic mucosa with granulation tissue Cycle 4 adjuvant Xeloda 07/19/2020, 2000 mg every morning and 1500 mg every afternoon Cycle 5 adjuvant Xeloda 08/09/2020 Cycle 6 adjuvant Xeloda 08/31/2019 Cycle 7 adjuvant Xeloda 09/20/2020 Cycle 8 adjuvant Xeloda 10/11/2020 CTs 03/07/2021-no evidence of recurrent disease, hepatic steatosis, slight wall thickening at the junction of the descending and horizontal duodenum Mild elevation of CEA 2023 12/01/2021-Guardant Reveal-ct DNA detected PET 12/30/2021-hypermetabolic right liver lesion, hypermetabolic area in a loop of mid small bowel with an SUV of 12.5, review of PET images at GI tumor conference 01/11/2022-small bowel uptake felt to be a benign finding MRI liver 01/10/2022-solitary 1.2 cm right liver lesion between segments 7 and 8 compatible with metastasis, subtle changes suggesting cirrhosis, hepatic steatosis CT enteroscopy 02/02/2022-small bowel loop with hypermetabolic activity on PET continues to have a bandlike density along the margin felt to represent a diverticulum or adjacent nodal tissue.  Small bowel tumor not excluded.  3-4 mm left omental nodule 02/08/2022-biopsy and ablation of solitary liver lesion, adenocarcinoma consistent with metastatic colorectal adenocarcinoma CTs 03/31/2022-unchanged circumstantial soft tissue thickening surrounding a loop of mid to distal small bowel with an adjacent nodular focus measuring 1.2 cm.,  Ablation cavity in segment 7, no other evidence of metastatic disease Diagnostic laparoscopy 05/26/2022-mid small bowel mass-resected, well to moderately differentiated adenocarcinoma, 0/2 lymph nodes, morphology consistent with a colon primary, tumor involved full-thickness including serosa with perineural and large vessel involvement; foundation 1-microsatellite stable, tumor mutation burden 2,  K-ras Q61H CT abdomen/pelvis 07/10/2022-the right liver ablation defect is smaller, new subtle right  liver lesion measuring 13 x 9 mm, enlarged lymph node in the right upper quadrant mesentery adjacent to surgical anastomosis PET 07/26/2022-new segment 6 liver lesion, enlarging hypermetabolic left omental lesion, there is another mildly hypermetabolic peritoneal implant, 7 mm omental nodule at the midline without hypermetabolism Cycle 1 FOLFOX 09/25/2022 Cycle 2 FOLFOX/bevacizumab 10/09/2022, 5-FU dose reduced secondary to mucositis Cycle 3 FOLFOX/bevacizumab 10/23/2022 Cycle 4 FOLFOX/bevacizumab 11/06/2022 Cycle 5 FOLFOX/bevacizumab 11/20/2022 Cycle 6 FOLFOX/bevacizumab 12/04/2022 CT abdomen/pelvis 12/14/2022-further contraction of the treated segment 7 lesion, slight enlargement of segment 6 lesion, no new liver lesion, increased size of ileocolic nodes and an omental lesion, stable small retroperitoneal nodes Cycle 7 FOLFOX/bevacizumab 12/18/2022 Cycle 8 FOLFOX/bevacizumab 01/02/2023, oxaliplatin dose reduced secondary to prolonged nausea and neuropathy symptoms, Emend added Cycle 9 FOLFOX/bevacizumab 01/16/2023, oxaliplatin discontinued secondary to neuropathy Cycle 1 FOLFIRI 02/19/2023, Avastin held due to recent dental extractions Cycle 2 FOLFIRI/Avastin 03/05/2023 Cycle 3 FOLFIRI/Avastin 03/19/2023, 5-FU bolus held due to mucositis after cycle 2, Fulphila added Cycle 4 FOLFIRI/Avastin 04/02/2023, Fulphila, Emend Cycle 5 FOLFIRI/Avastin 04/16/2023, Fulphila, Emend Microcytic anemia secondary to #1-improved Colon polyps-sessile serrated adenoma of the descending colon, tubular adenomas with focal high-grade dysplasia of the transverse colon on colonoscopy 02/12/2020, tubular adenoma of the mid ascending colon on the surgical specimen 04/12/2020 Family history of colon cancer Depression Anxiety/panic attacks Hypertension Hyperlipidemia Nausea secondary to Xeloda?-Resolved Gastroesophageal reflux  disease-improved following discontinuation of Xeloda Anemia, microcytic; ferritin 6 01/24/2022; stool positive for blood x3 01/25/2022; 02/02/2022 CT abdomen/pelvis enterography-bowel loop demonstrating hypermetabolic activity continues to have a bandlike density along its margin possibly representing a diverticulum or adjacent nodal tissue, difficult to exclude small bowel tumor, 3 x 4 mm left omental nodule or lymph node, known right hepatic lobe tumor is occult on CT. Venofer weekly x3 beginning 03/03/2022 Negative capsule endoscopy 03/09/2022 Venofer 03/03/2022 for 3 weekly doses Venofer 05/03/2022, 05/19/2022 Venofer 07/28/2022, 08/04/2022 12.  Anterior left neck nodule 03/17/2022-cyst?,  Lymph node? 13.  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  Oxaliplatin neuropathy-mild loss of vibratory sense 12/04/2022, 12/18/2022, 01/02/2023    Disposition: Anthony Calhoun appears stable.  He has completed 4 cycles of FOLFIRI/Avastin.  Plan to proceed with cycle 5 today as scheduled.  Restaging CTs prior to next office visit.  CBC reviewed.  Counts adequate to proceed as above.  He will return for follow-up as scheduled in 3 weeks.  We are available to see him sooner if needed.    Lonna Cobb ANP/GNP-BC   04/16/2023  9:32 AM

## 2023-04-18 ENCOUNTER — Telehealth: Payer: Self-pay | Admitting: *Deleted

## 2023-04-18 ENCOUNTER — Inpatient Hospital Stay: Payer: Medicare HMO

## 2023-04-18 VITALS — BP 116/67 | HR 75 | Temp 98.2°F | Resp 18

## 2023-04-18 DIAGNOSIS — D509 Iron deficiency anemia, unspecified: Secondary | ICD-10-CM | POA: Diagnosis not present

## 2023-04-18 DIAGNOSIS — F32A Depression, unspecified: Secondary | ICD-10-CM | POA: Diagnosis not present

## 2023-04-18 DIAGNOSIS — F410 Panic disorder [episodic paroxysmal anxiety] without agoraphobia: Secondary | ICD-10-CM | POA: Diagnosis not present

## 2023-04-18 DIAGNOSIS — Z5111 Encounter for antineoplastic chemotherapy: Secondary | ICD-10-CM | POA: Diagnosis not present

## 2023-04-18 DIAGNOSIS — C189 Malignant neoplasm of colon, unspecified: Secondary | ICD-10-CM

## 2023-04-18 DIAGNOSIS — C182 Malignant neoplasm of ascending colon: Secondary | ICD-10-CM | POA: Diagnosis not present

## 2023-04-18 DIAGNOSIS — Z79899 Other long term (current) drug therapy: Secondary | ICD-10-CM | POA: Diagnosis not present

## 2023-04-18 DIAGNOSIS — I10 Essential (primary) hypertension: Secondary | ICD-10-CM | POA: Diagnosis not present

## 2023-04-18 DIAGNOSIS — K219 Gastro-esophageal reflux disease without esophagitis: Secondary | ICD-10-CM | POA: Diagnosis not present

## 2023-04-18 DIAGNOSIS — E785 Hyperlipidemia, unspecified: Secondary | ICD-10-CM | POA: Diagnosis not present

## 2023-04-18 MED ORDER — HEPARIN SOD (PORK) LOCK FLUSH 100 UNIT/ML IV SOLN
500.0000 [IU] | Freq: Once | INTRAVENOUS | Status: AC | PRN
  Administered 2023-04-18: 500 [IU]

## 2023-04-18 MED ORDER — SODIUM CHLORIDE 0.9% FLUSH
10.0000 mL | INTRAVENOUS | Status: DC | PRN
  Administered 2023-04-18: 10 mL

## 2023-04-18 MED ORDER — PEGFILGRASTIM-JMDB 6 MG/0.6ML ~~LOC~~ SOSY
6.0000 mg | PREFILLED_SYRINGE | Freq: Once | SUBCUTANEOUS | Status: AC
  Administered 2023-04-18: 6 mg via SUBCUTANEOUS
  Filled 2023-04-18: qty 0.6

## 2023-04-18 NOTE — Patient Instructions (Signed)

## 2023-04-18 NOTE — Telephone Encounter (Signed)
Called to report the site of his Fulphila injection is red/slightly raised and splotchy. Suggested he try cool compress (not ice) and topical hydrocortisone or benadryl cream and watch. No need to worry unless redness extends down his arm or he has SOB.

## 2023-04-19 ENCOUNTER — Other Ambulatory Visit: Payer: Self-pay

## 2023-04-26 DIAGNOSIS — C182 Malignant neoplasm of ascending colon: Secondary | ICD-10-CM | POA: Diagnosis not present

## 2023-05-02 ENCOUNTER — Ambulatory Visit (HOSPITAL_BASED_OUTPATIENT_CLINIC_OR_DEPARTMENT_OTHER)
Admission: RE | Admit: 2023-05-02 | Discharge: 2023-05-02 | Disposition: A | Discharge status: Home / Self Care | Payer: Medicare HMO | Source: Ambulatory Visit | Attending: Nurse Practitioner | Admitting: Nurse Practitioner

## 2023-05-02 DIAGNOSIS — C787 Secondary malignant neoplasm of liver and intrahepatic bile duct: Secondary | ICD-10-CM | POA: Diagnosis not present

## 2023-05-02 DIAGNOSIS — K7460 Unspecified cirrhosis of liver: Secondary | ICD-10-CM | POA: Diagnosis not present

## 2023-05-02 DIAGNOSIS — K7290 Hepatic failure, unspecified without coma: Secondary | ICD-10-CM | POA: Diagnosis not present

## 2023-05-02 DIAGNOSIS — C189 Malignant neoplasm of colon, unspecified: Secondary | ICD-10-CM | POA: Diagnosis not present

## 2023-05-02 MED ORDER — IOHEXOL 300 MG/ML  SOLN
100.0000 mL | Freq: Once | INTRAMUSCULAR | Status: AC | PRN
  Administered 2023-05-02: 100 mL via INTRAVENOUS

## 2023-05-06 ENCOUNTER — Other Ambulatory Visit: Payer: Self-pay | Admitting: Oncology

## 2023-05-07 ENCOUNTER — Other Ambulatory Visit: Payer: Self-pay | Admitting: *Deleted

## 2023-05-07 ENCOUNTER — Inpatient Hospital Stay (HOSPITAL_BASED_OUTPATIENT_CLINIC_OR_DEPARTMENT_OTHER): Payer: Medicare HMO | Admitting: Oncology

## 2023-05-07 ENCOUNTER — Inpatient Hospital Stay: Payer: Medicare HMO

## 2023-05-07 ENCOUNTER — Encounter: Payer: Self-pay | Admitting: *Deleted

## 2023-05-07 ENCOUNTER — Inpatient Hospital Stay: Payer: Medicare HMO | Attending: Oncology

## 2023-05-07 VITALS — BP 136/72 | HR 86 | Temp 98.1°F | Resp 18 | Ht 66.0 in | Wt 249.7 lb

## 2023-05-07 VITALS — BP 119/68 | HR 72 | Resp 18

## 2023-05-07 DIAGNOSIS — Z800 Family history of malignant neoplasm of digestive organs: Secondary | ICD-10-CM | POA: Insufficient documentation

## 2023-05-07 DIAGNOSIS — K219 Gastro-esophageal reflux disease without esophagitis: Secondary | ICD-10-CM | POA: Diagnosis not present

## 2023-05-07 DIAGNOSIS — Z5111 Encounter for antineoplastic chemotherapy: Secondary | ICD-10-CM | POA: Insufficient documentation

## 2023-05-07 DIAGNOSIS — F32A Depression, unspecified: Secondary | ICD-10-CM | POA: Insufficient documentation

## 2023-05-07 DIAGNOSIS — D509 Iron deficiency anemia, unspecified: Secondary | ICD-10-CM | POA: Insufficient documentation

## 2023-05-07 DIAGNOSIS — Z79899 Other long term (current) drug therapy: Secondary | ICD-10-CM | POA: Diagnosis not present

## 2023-05-07 DIAGNOSIS — E785 Hyperlipidemia, unspecified: Secondary | ICD-10-CM | POA: Diagnosis not present

## 2023-05-07 DIAGNOSIS — C787 Secondary malignant neoplasm of liver and intrahepatic bile duct: Secondary | ICD-10-CM

## 2023-05-07 DIAGNOSIS — F410 Panic disorder [episodic paroxysmal anxiety] without agoraphobia: Secondary | ICD-10-CM | POA: Diagnosis not present

## 2023-05-07 DIAGNOSIS — C189 Malignant neoplasm of colon, unspecified: Secondary | ICD-10-CM

## 2023-05-07 DIAGNOSIS — C182 Malignant neoplasm of ascending colon: Secondary | ICD-10-CM | POA: Diagnosis not present

## 2023-05-07 DIAGNOSIS — I10 Essential (primary) hypertension: Secondary | ICD-10-CM | POA: Insufficient documentation

## 2023-05-07 LAB — CMP (CANCER CENTER ONLY)
ALT: 22 U/L (ref 0–44)
AST: 27 U/L (ref 15–41)
Albumin: 3.7 g/dL (ref 3.5–5.0)
Alkaline Phosphatase: 173 U/L — ABNORMAL HIGH (ref 38–126)
Anion gap: 5 (ref 5–15)
BUN: 13 mg/dL (ref 6–20)
CO2: 28 mmol/L (ref 22–32)
Calcium: 8.4 mg/dL — ABNORMAL LOW (ref 8.9–10.3)
Chloride: 103 mmol/L (ref 98–111)
Creatinine: 0.67 mg/dL (ref 0.61–1.24)
GFR, Estimated: >60 mL/min (ref >60)
Glucose, Bld: 209 mg/dL — ABNORMAL HIGH (ref 70–99)
Potassium: 3.6 mmol/L (ref 3.5–5.1)
Sodium: 136 mmol/L (ref 135–145)
Total Bilirubin: 0.8 mg/dL (ref 0.3–1.2)
Total Protein: 6.6 g/dL (ref 6.5–8.1)

## 2023-05-07 LAB — CBC WITH DIFFERENTIAL (CANCER CENTER ONLY)
Abs Immature Granulocytes: 0.04 10*3/uL (ref 0.00–0.07)
Basophils Absolute: 0.1 10*3/uL (ref 0.0–0.1)
Basophils Relative: 1 %
Eosinophils Absolute: 0.2 10*3/uL (ref 0.0–0.5)
Eosinophils Relative: 4 %
HCT: 32.1 % — ABNORMAL LOW (ref 39.0–52.0)
Hemoglobin: 9.9 g/dL — ABNORMAL LOW (ref 13.0–17.0)
Immature Granulocytes: 1 %
Lymphocytes Relative: 19 %
Lymphs Abs: 0.8 10*3/uL (ref 0.7–4.0)
MCH: 26.4 pg (ref 26.0–34.0)
MCHC: 30.8 g/dL (ref 30.0–36.0)
MCV: 85.6 fL (ref 80.0–100.0)
Monocytes Absolute: 0.4 10*3/uL (ref 0.1–1.0)
Monocytes Relative: 9 %
Neutro Abs: 3.1 10*3/uL (ref 1.7–7.7)
Neutrophils Relative %: 66 %
Platelet Count: 177 10*3/uL (ref 150–400)
RBC: 3.75 MIL/uL — ABNORMAL LOW (ref 4.22–5.81)
RDW: 22.4 % — ABNORMAL HIGH (ref 11.5–15.5)
WBC Count: 4.6 10*3/uL (ref 4.0–10.5)
nRBC: 0 % (ref 0.0–0.2)

## 2023-05-07 LAB — TOTAL PROTEIN, URINE DIPSTICK: Protein, ur: NEGATIVE mg/dL

## 2023-05-07 LAB — CEA (ACCESS): CEA (CHCC): 17.68 ng/mL — ABNORMAL HIGH (ref 0.00–5.00)

## 2023-05-07 MED ORDER — SODIUM CHLORIDE 0.9 % IV SOLN
200.0000 mg/m2 | Freq: Once | INTRAVENOUS | Status: AC
  Administered 2023-05-07: 444 mg via INTRAVENOUS
  Filled 2023-05-07: qty 22.2

## 2023-05-07 MED ORDER — SODIUM CHLORIDE 0.9 % IV SOLN
1600.0000 mg/m2 | INTRAVENOUS | Status: DC
  Administered 2023-05-07: 3500 mg via INTRAVENOUS
  Filled 2023-05-07: qty 70

## 2023-05-07 MED ORDER — MAGIC MOUTHWASH: 5.0000 mL | Freq: Four times a day (QID) | ORAL | 1 refills | Status: DC | PRN

## 2023-05-07 MED ORDER — PALONOSETRON HCL INJECTION 0.25 MG/5ML
0.2500 mg | Freq: Once | INTRAVENOUS | Status: AC
  Administered 2023-05-07: 0.25 mg via INTRAVENOUS
  Filled 2023-05-07: qty 5

## 2023-05-07 MED ORDER — ATROPINE SULFATE 1 MG/ML IV SOLN
0.5000 mg | Freq: Once | INTRAVENOUS | Status: AC | PRN
  Administered 2023-05-07: 0.5 mg via INTRAVENOUS
  Filled 2023-05-07: qty 1

## 2023-05-07 MED ORDER — SODIUM CHLORIDE 0.9 % IV SOLN: Freq: Once | INTRAVENOUS | Status: AC

## 2023-05-07 MED ORDER — SODIUM CHLORIDE 0.9 % IV SOLN
150.0000 mg | Freq: Once | INTRAVENOUS | Status: AC
  Administered 2023-05-07: 150 mg via INTRAVENOUS
  Filled 2023-05-07: qty 5

## 2023-05-07 MED ORDER — SODIUM CHLORIDE 0.9 % IV SOLN
5.0000 mg/kg | Freq: Once | INTRAVENOUS | Status: AC
  Administered 2023-05-07: 500 mg via INTRAVENOUS
  Filled 2023-05-07: qty 16

## 2023-05-07 MED ORDER — SODIUM CHLORIDE 0.9 % IV SOLN
180.0000 mg/m2 | Freq: Once | INTRAVENOUS | Status: AC
  Administered 2023-05-07: 400 mg via INTRAVENOUS
  Filled 2023-05-07: qty 15

## 2023-05-07 MED ORDER — SODIUM CHLORIDE 0.9 % IV SOLN
10.0000 mg | Freq: Once | INTRAVENOUS | Status: AC
  Administered 2023-05-07: 10 mg via INTRAVENOUS
  Filled 2023-05-07: qty 10

## 2023-05-07 MED ORDER — FLUOROURACIL CHEMO INJECTION 500 MG/10ML
200.0000 mg/m2 | Freq: Once | INTRAVENOUS | Status: AC
  Administered 2023-05-07: 450 mg via INTRAVENOUS
  Filled 2023-05-07: qty 9

## 2023-05-07 NOTE — Progress Notes (Signed)
Patient seen by Dr. Sherrill today ? ?Vitals are within treatment parameters. ? ?Labs reviewed by Dr. Sherrill and are within treatment parameters. ? ?Per physician team, patient is ready for treatment and there are NO modifications to the treatment plan.  ?

## 2023-05-07 NOTE — Progress Notes (Signed)
[facility] OFFICE PROGRESS NOTE   Diagnosis: Colon cancer  INTERVAL HISTORY:   Anthony Calhoun completed another cycle of FOLFIRI/bevacizumab on 04/16/2023.  He complete another cycle FOLFIRI/bevacizumab 04/16/2023.  He reports mild nosebleeding.  He has nausea beginning 1-2 days following chemotherapy and lasting for several days.  Nausea improves during the week off of chemotherapy.  He is eating.  No mouth sores.  He continues to have numbness in the hands and feet.  He reports abdominal "tightness ".  He has bone pain following G-CSF. He reports his blood sugar has been running at less than 200. Objective:  Vital signs in last 24 hours:  Blood pressure 136/72, pulse 86, temperature 98.1 F (36.7 C), temperature source Oral, resp. rate 18, height 5\' 6"  (1.676 m), weight 249 lb 11.2 oz (113.3 kg), SpO2 99%.    HEENT: No thrush or ulcers Resp: Lungs clear bilaterally Cardio: Regular rate and rhythm GI: No hepatosplenomegaly, no apparent ascites, no mass Vascular: No leg edema    Portacath/PICC-without erythema  Lab Results:  Lab Results  Component Value Date   WBC 4.6 05/07/2023   HGB 9.9 (L) 05/07/2023   HCT 32.1 (L) 05/07/2023   MCV 85.6 05/07/2023   PLT 177 05/07/2023   NEUTROABS 3.1 05/07/2023    CMP  Lab Results  Component Value Date   NA 137 04/16/2023   K 3.6 04/16/2023   CL 103 04/16/2023   CO2 24 04/16/2023   GLUCOSE 293 (H) 04/16/2023   BUN 10 04/16/2023   CREATININE 0.70 04/16/2023   CALCIUM 8.5 (L) 04/16/2023   PROT 6.8 04/16/2023   ALBUMIN 3.7 04/16/2023   AST 24 04/16/2023   ALT 27 04/16/2023   ALKPHOS 180 (H) 04/16/2023   BILITOT 0.7 04/16/2023   GFRNONAA >60 04/16/2023   GFRAA >60 05/10/2020    Lab Results  Component Value Date   CEA1 1.08 03/07/2021   CEA 20.55 (H) 04/16/2023    Medications: I have reviewed the patient's current medications.   Assessment/Plan: Moderately differentiated adenocarcinoma of the  ascending colon, stage IIb (T4aN0), status post a right colectomy 04/12/2020 Tumor invades the visceral peritoneum, 0/19 lymph nodes, no lymphovascular or perineural invasion, no tumor deposits, MSS, no loss of mismatch repair protein expression Cycle 1 adjuvant Xeloda 05/17/2020 Cycle 2 adjuvant Xeloda 06/07/2020, Xeloda discontinued after 12 days secondary to nausea and rectal bleeding Cycle 3 adjuvant Xeloda 06/28/2020, dose reduced to 2000 mg a.m., 1500 mg p.m. secondary to nausea (patient discontinued Xeloda on day 11 due to colonoscopy) Colonoscopy 07/09/2020-nodular mucosa at the colonic anastomosis, biopsied; patent end-to-side ileocolonic anastomosis characterized by healthy-appearing mucosa and visible sutures; nonbleeding internal hemorrhoids, biopsy with benign ulcerated anastomotic mucosa with granulation tissue Cycle 4 adjuvant Xeloda 07/19/2020, 2000 mg every morning and 1500 mg every afternoon Cycle 5 adjuvant Xeloda 08/09/2020 Cycle 6 adjuvant Xeloda 08/31/2019 Cycle 7 adjuvant Xeloda 09/20/2020 Cycle 8 adjuvant Xeloda 10/11/2020 CTs 03/07/2021-no evidence of recurrent disease, hepatic steatosis, slight wall thickening at the junction of the descending and horizontal duodenum Mild elevation of CEA 2023 12/01/2021-Guardant Reveal-ct DNA detected PET 12/30/2021-hypermetabolic right liver lesion, hypermetabolic area in a loop of mid small bowel with an SUV of 12.5, review of PET images at GI tumor conference 01/11/2022-small bowel uptake felt to be a benign finding MRI liver 01/10/2022-solitary 1.2 cm right liver lesion between segments 7 and 8 compatible with metastasis, subtle changes suggesting cirrhosis, hepatic steatosis CT enteroscopy 02/02/2022-small bowel loop with hypermetabolic activity on PET continues to  have a bandlike density along the margin felt to represent a diverticulum or adjacent nodal tissue.  Small bowel tumor not excluded.  3-4 mm left omental nodule 02/08/2022-biopsy and  ablation of solitary liver lesion, adenocarcinoma consistent with metastatic colorectal adenocarcinoma CTs 03/31/2022-unchanged circumstantial soft tissue thickening surrounding a loop of mid to distal small bowel with an adjacent nodular focus measuring 1.2 cm.,  Ablation cavity in segment 7, no other evidence of metastatic disease Diagnostic laparoscopy 05/26/2022-mid small bowel mass-resected, well to moderately differentiated adenocarcinoma, 0/2 lymph nodes, morphology consistent with a colon primary, tumor involved full-thickness including serosa with perineural and large vessel involvement; foundation 1-microsatellite stable, tumor mutation burden 2, K-ras Q61H CT abdomen/pelvis 07/10/2022-the right liver ablation defect is smaller, new subtle right liver lesion measuring 13 x 9 mm, enlarged lymph node in the right upper quadrant mesentery adjacent to surgical anastomosis PET 07/26/2022-new segment 6 liver lesion, enlarging hypermetabolic left omental lesion, there is another mildly hypermetabolic peritoneal implant, 7 mm omental nodule at the midline without hypermetabolism Cycle 1 FOLFOX 09/25/2022 Cycle 2 FOLFOX/bevacizumab 10/09/2022, 5-FU dose reduced secondary to mucositis Cycle 3 FOLFOX/bevacizumab 10/23/2022 Cycle 4 FOLFOX/bevacizumab 11/06/2022 Cycle 5 FOLFOX/bevacizumab 11/20/2022 Cycle 6 FOLFOX/bevacizumab 12/04/2022 CT abdomen/pelvis 12/14/2022-further contraction of the treated segment 7 lesion, slight enlargement of segment 6 lesion, no new liver lesion, increased size of ileocolic nodes and an omental lesion, stable small retroperitoneal nodes Cycle 7 FOLFOX/bevacizumab 12/18/2022 Cycle 8 FOLFOX/bevacizumab 01/02/2023, oxaliplatin dose reduced secondary to prolonged nausea and neuropathy symptoms, Emend added Cycle 9 FOLFOX/bevacizumab 01/16/2023, oxaliplatin discontinued secondary to neuropathy Cycle 1 FOLFIRI 02/19/2023, Avastin held due to recent dental extractions Cycle 2 FOLFIRI/Avastin  03/05/2023 Cycle 3 FOLFIRI/Avastin 03/19/2023, 5-FU bolus held due to mucositis after cycle 2, Fulphila added Cycle 4 FOLFIRI/Avastin 04/02/2023, Fulphila, Emend Cycle 5 FOLFIRI/Avastin 04/16/2023, Fulphila, Emend CTs 05/22/2023-similar ileocolic mesenteric adenopathy and omental metastasis, cirrhosis with portal hypertension, stable liver lesions, no new or progressive disease Cycle 6 FOLFIRI/Avastin 05/07/2023, Fulphila, Emend Microcytic anemia secondary to #1-improved Colon polyps-sessile serrated adenoma of the descending colon, tubular adenomas with focal high-grade dysplasia of the transverse colon on colonoscopy 02/12/2020, tubular adenoma of the mid ascending colon on the surgical specimen 04/12/2020 Family history of colon cancer Depression Anxiety/panic attacks Hypertension Hyperlipidemia Nausea secondary to Xeloda?-Resolved Gastroesophageal reflux disease-improved following discontinuation of Xeloda Anemia, microcytic; ferritin 6 01/24/2022; stool positive for blood x3 01/25/2022; 02/02/2022 CT abdomen/pelvis enterography-bowel loop demonstrating hypermetabolic activity continues to have a bandlike density along its margin possibly representing a diverticulum or adjacent nodal tissue, difficult to exclude small bowel tumor, 3 x 4 mm left omental nodule or lymph node, known right hepatic lobe tumor is occult on CT. Venofer weekly x3 beginning 03/03/2022 Negative capsule endoscopy 03/09/2022 Venofer 03/03/2022 for 3 weekly doses Venofer 05/03/2022, 05/19/2022 Venofer 07/28/2022, 08/04/2022 12.  Anterior left neck nodule 03/17/2022-cyst?,  Lymph node? 13.  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  Oxaliplatin neuropathy-mild loss of vibratory sense 12/04/2022, 12/18/2022, 01/02/2023      Disposition: Anthony Calhoun appears stable.  I reviewed the restaging CT findings and images with him.  The CT is consistent with stable to mildly improved disease.  The CEA is lower.  He has completed 5 cycles of FOLFIRI/bevacizumab.  I recommend continuing the current treatment.  He is in agreement.  He will  complete another cycle today.  He has persistent nausea following chemotherapy.  We have not prescribed home Decadron prophylaxis since he has diabetes.  We discussed Zyprexa.  He does not wish to begin this at present.  He will continue Phenergan as needed for nausea.  Anthony Calhoun will return for an office visit and chemotherapy in 2 weeks.  He will undergo a restaging CT after another 5 cycles of FOLFIRI/bevacizumab.  Thornton Papas, MD  05/07/2023  9:09 AM

## 2023-05-07 NOTE — Patient Instructions (Addendum)
[place] [facility]   Discharge Instructions: Thank you for choosing [facility] to provide your oncology and hematology care.   If you have a lab appointment with the Cancer Center, please go directly to the Cancer Center and check in at the registration area.   Wear comfortable clothing and clothing appropriate for easy access to any Portacath or PICC line.   We strive to give you quality time with your provider. You may need to reschedule your appointment if you arrive late (15 or more minutes).  Arriving late affects you and other patients whose appointments are after yours.  Also, if you miss three or more appointments without notifying the office, you may be dismissed from the clinic at the provider's discretion.      For prescription refill requests, have your pharmacy contact our office and allow 72 hours for refills to be completed.    Today you received the following chemotherapy and/or immunotherapy agents Bevacizumab-awwb (MVASI), Irinotecan (CAMPTOSAR), Leucovorin & Flourouracil (ADRUCIL).      To help prevent nausea and vomiting after your treatment, we encourage you to take your nausea medication as directed.  BELOW ARE SYMPTOMS THAT SHOULD BE REPORTED IMMEDIATELY: *FEVER GREATER THAN 100.4 F (38 C) OR HIGHER *CHILLS OR SWEATING *NAUSEA AND VOMITING THAT IS NOT CONTROLLED WITH YOUR NAUSEA MEDICATION *UNUSUAL SHORTNESS OF BREATH *UNUSUAL BRUISING OR BLEEDING *URINARY PROBLEMS (pain or burning when urinating, or frequent urination) *BOWEL PROBLEMS (unusual diarrhea, constipation, pain near the anus) TENDERNESS IN MOUTH AND THROAT WITH OR WITHOUT PRESENCE OF ULCERS (sore throat, sores in mouth, or a toothache) UNUSUAL RASH, SWELLING OR PAIN  UNUSUAL VAGINAL DISCHARGE OR ITCHING   Items with * indicate a potential emergency and should be followed up as soon as possible or go to the Emergency Department if any problems should  occur.  Please show the CHEMOTHERAPY ALERT CARD or IMMUNOTHERAPY ALERT CARD at check-in to the Emergency Department and triage nurse.  Should you have questions after your visit or need to cancel or reschedule your appointment, please contact [place] [facility]  Dept: [phone]  and follow the prompts.  Office hours are 8:00 a.m. to 4:30 p.m. Monday - Friday. Please note that voicemails left after 4:00 p.m. may not be returned until the following business day.  We are closed weekends and major holidays. You have access to a nurse at all times for urgent questions. Please call the main number to the clinic Dept: [phone] and follow the prompts.   For any non-urgent questions, you may also contact your provider using MyChart. We now offer e-Visits for anyone 1 and older to request care online for non-urgent symptoms. For details visit [email or web address].   Also download the MyChart app! Go to the app store, search "MyChart", open the app, select [place], and log in with your MyChart username and password.  Bevacizumab Injection What is this medication? BEVACIZUMAB (be va SIZ yoo mab) treats some types of cancer. It works by blocking a protein that causes cancer cells to grow and multiply. This helps to slow or stop the spread of cancer cells. It is a monoclonal antibody. This medicine may be used for other purposes; ask your health care provider or pharmacist if you have questions. COMMON BRAND NAME(S): Alymsys, Avastin, MVASI, Omer Jack What should I tell my care team before I take this medication? They need to know if you have any of these conditions: Blood clots Coughing up  blood Having or recent surgery Heart failure High blood pressure History of a connection between 2 or more body parts that do not usually connect (fistula) History of a tear in your stomach or intestines Protein in your urine An unusual or allergic reaction to bevacizumab,  other medications, foods, dyes, or preservatives Pregnant or trying to get pregnant Breast-feeding How should I use this medication? This medication is injected into a vein. It is given by your care team in a hospital or clinic setting. Talk to your care team the use of this medication in children. Special care may be needed. Overdosage: If you think you have taken too much of this medicine contact a poison control center or emergency room at once. NOTE: This medicine is only for you. Do not share this medicine with others. What if I miss a dose? Keep appointments for follow-up doses. It is important not to miss your dose. Call your care team if you are unable to keep an appointment. What may interact with this medication? Interactions are not expected. This list may not describe all possible interactions. Give your health care provider a list of all the medicines, herbs, non-prescription drugs, or dietary supplements you use. Also tell them if you smoke, drink alcohol, or use illegal drugs. Some items may interact with your medicine. What should I watch for while using this medication? Your condition will be monitored carefully while you are receiving this medication. You may need blood work while taking this medication. This medication may make you feel generally unwell. This is not uncommon as chemotherapy can affect healthy cells as well as cancer cells. Report any side effects. Continue your course of treatment even though you feel ill unless your care team tells you to stop. This medication may increase your risk to bruise or bleed. Call your care team if you notice any unusual bleeding. Before having surgery, talk to your care team to make sure it is ok. This medication can increase the risk of poor healing of your surgical site or wound. You will need to stop this medication for 28 days before surgery. After surgery, wait at least 28 days before restarting this medication. Make sure the  surgical site or wound is healed enough before restarting this medication. Talk to your care team if questions. Talk to your care team if you may be pregnant. Serious birth defects can occur if you take this medication during pregnancy and for 6 months after the last dose. Contraception is recommended while taking this medication and for 6 months after the last dose. Your care team can help you find the option that works for you. Do not breastfeed while taking this medication and for 6 months after the last dose. This medication can cause infertility. Talk to your care team if you are concerned about your fertility. What side effects may I notice from receiving this medication? Side effects that you should report to your care team as soon as possible: Allergic reactions--skin rash, itching, hives, swelling of the face, lips, tongue, or throat Bleeding--bloody or black, tar-like stools, vomiting blood or brown material that looks like coffee grounds, red or dark brown urine, small red or purple spots on skin, unusual bruising or bleeding Blood clot--pain, swelling, or warmth in the leg, shortness of breath, chest pain Heart attack--pain or tightness in the chest, shoulders, arms, or jaw, nausea, shortness of breath, cold or clammy skin, feeling faint or lightheaded Heart failure--shortness of breath, swelling of the ankles, feet, or hands,  sudden weight gain, unusual weakness or fatigue Increase in blood pressure Infection--fever, chills, cough, sore throat, wounds that don't heal, pain or trouble when passing urine, general feeling of discomfort or being unwell Infusion reactions--chest pain, shortness of breath or trouble breathing, feeling faint or lightheaded Kidney injury--decrease in the amount of urine, swelling of the ankles, hands, or feet Stomach pain that is severe, does not go away, or gets worse Stroke--sudden numbness or weakness of the face, arm, or leg, trouble speaking, confusion,  trouble walking, loss of balance or coordination, dizziness, severe headache, change in vision Sudden and severe headache, confusion, change in vision, seizures, which may be signs of posterior reversible encephalopathy syndrome (PRES) Side effects that usually do not require medical attention (report to your care team if they continue or are bothersome): Back pain Change in taste Diarrhea Dry skin Increased tears Nosebleed This list may not describe all possible side effects. Call your doctor for medical advice about side effects. You may report side effects to FDA at 1-800-FDA-1088. Where should I keep my medication? This medication is given in a hospital or clinic. It will not be stored at home. NOTE: This sheet is a summary. It may not cover all possible information. If you have questions about this medicine, talk to your doctor, pharmacist, or health care provider.  2024 Elsevier/Gold Standard (2021-12-30 00:00:00)   Irinotecan Injection What is this medication? IRINOTECAN (ir in oh TEE kan) treats some types of cancer. It works by slowing down the growth of cancer cells. This medicine may be used for other purposes; ask your health care provider or pharmacist if you have questions. COMMON BRAND NAME(S): Camptosar What should I tell my care team before I take this medication? They need to know if you have any of these conditions: Dehydration Diarrhea Infection, especially a viral infection, such as chickenpox, cold sores, herpes Liver disease Low blood cell levels (white cells, red cells, and platelets) Low levels of electrolytes, such as calcium, magnesium, or potassium in your blood Recent or ongoing radiation An unusual or allergic reaction to irinotecan, other medications, foods, dyes, or preservatives If you or your partner are pregnant or trying to get pregnant Breast-feeding How should I use this medication? This medication is injected into a vein. It is given by your  care team in a hospital or clinic setting. Talk to your care team about the use of this medication in children. Special care may be needed. Overdosage: If you think you have taken too much of this medicine contact a poison control center or emergency room at once. NOTE: This medicine is only for you. Do not share this medicine with others. What if I miss a dose? Keep appointments for follow-up doses. It is important not to miss your dose. Call your care team if you are unable to keep an appointment. What may interact with this medication? Do not take this medication with any of the following: Cobicistat Itraconazole This medication may also interact with the following: Certain antibiotics, such as clarithromycin, rifampin, rifabutin Certain antivirals for HIV or AIDS Certain medications for fungal infections, such as ketoconazole, posaconazole, voriconazole Certain medications for seizures, such as carbamazepine, phenobarbital, phenytoin Gemfibrozil Nefazodone St. John's wort This list may not describe all possible interactions. Give your health care provider a list of all the medicines, herbs, non-prescription drugs, or dietary supplements you use. Also tell them if you smoke, drink alcohol, or use illegal drugs. Some items may interact with your medicine. What should I  watch for while using this medication? Your condition will be monitored carefully while you are receiving this medication. You may need blood work while taking this medication. This medication may make you feel generally unwell. This is not uncommon as chemotherapy can affect healthy cells as well as cancer cells. Report any side effects. Continue your course of treatment even though you feel ill unless your care team tells you to stop. This medication can cause serious side effects. To reduce the risk, your care team may give you other medications to take before receiving this one. Be sure to follow the directions from your  care team. This medication may affect your coordination, reaction time, or judgement. Do not drive or operate machinery until you know how this medication affects you. Sit up or stand slowly to reduce the risk of dizzy or fainting spells. Drinking alcohol with this medication can increase the risk of these side effects. This medication may increase your risk of getting an infection. Call your care team for advice if you get a fever, chills, sore throat, or other symptoms of a cold or flu. Do not treat yourself. Try to avoid being around people who are sick. Avoid taking medications that contain aspirin, acetaminophen, ibuprofen, naproxen, or ketoprofen unless instructed by your care team. These medications may hide a fever. This medication may increase your risk to bruise or bleed. Call your care team if you notice any unusual bleeding. Be careful brushing or flossing your teeth or using a toothpick because you may get an infection or bleed more easily. If you have any dental work done, tell your dentist you are receiving this medication. Talk to your care team if you or your partner are pregnant or think either of you might be pregnant. This medication can cause serious birth defects if taken during pregnancy and for 6 months after the last dose. You will need a negative pregnancy test before starting this medication. Contraception is recommended while taking this medication and for 6 months after the last dose. Your care team can help you find the option that works for you. Do not father a child while taking this medication and for 3 months after the last dose. Use a condom for contraception during this time period. Do not breastfeed while taking this medication and for 7 days after the last dose. This medication may cause infertility. Talk to your care team if you are concerned about your fertility. What side effects may I notice from receiving this medication? Side effects that you should report to  your care team as soon as possible: Allergic reactions--skin rash, itching, hives, swelling of the face, lips, tongue, or throat Dry cough, shortness of breath or trouble breathing Increased saliva or tears, increased sweating, stomach cramping, diarrhea, small pupils, unusual weakness or fatigue, slow heartbeat Infection--fever, chills, cough, sore throat, wounds that don't heal, pain or trouble when passing urine, general feeling of discomfort or being unwell Kidney injury--decrease in the amount of urine, swelling of the ankles, hands, or feet Low red blood cell level--unusual weakness or fatigue, dizziness, headache, trouble breathing Severe or prolonged diarrhea Unusual bruising or bleeding Side effects that usually do not require medical attention (report to your care team if they continue or are bothersome): Constipation Diarrhea Hair loss Loss of appetite Nausea Stomach pain This list may not describe all possible side effects. Call your doctor for medical advice about side effects. You may report side effects to FDA at 1-800-FDA-1088. Where should I keep my  medication? This medication is given in a hospital or clinic. It will not be stored at home. NOTE: This sheet is a summary. It may not cover all possible information. If you have questions about this medicine, talk to your doctor, pharmacist, or health care provider.  2024 Elsevier/Gold Standard (2021-12-26 00:00:00)   Leucovorin Injection What is this medication? LEUCOVORIN (loo koe VOR in) prevents side effects from certain medications, such as methotrexate. It works by increasing folate levels. This helps protect healthy cells in your body. It may also be used to treat anemia caused by low levels of folate. It can also be used with fluorouracil, a type of chemotherapy, to treat colorectal cancer. It works by increasing the effects of fluorouracil in the body. This medicine may be used for other purposes; ask your health care  provider or pharmacist if you have questions. What should I tell my care team before I take this medication? They need to know if you have any of these conditions: Anemia from low levels of vitamin B12 in the blood An unusual or allergic reaction to leucovorin, folic acid, other medications, foods, dyes, or preservatives Pregnant or trying to get pregnant Breastfeeding How should I use this medication? This medication is injected into a vein or a muscle. It is given by your care team in a hospital or clinic setting. Talk to your care team about the use of this medication in children. Special care may be needed. Overdosage: If you think you have taken too much of this medicine contact a poison control center or emergency room at once. NOTE: This medicine is only for you. Do not share this medicine with others. What if I miss a dose? Keep appointments for follow-up doses. It is important not to miss your dose. Call your care team if you are unable to keep an appointment. What may interact with this medication? Capecitabine Fluorouracil Phenobarbital Phenytoin Primidone Trimethoprim;sulfamethoxazole This list may not describe all possible interactions. Give your health care provider a list of all the medicines, herbs, non-prescription drugs, or dietary supplements you use. Also tell them if you smoke, drink alcohol, or use illegal drugs. Some items may interact with your medicine. What should I watch for while using this medication? Your condition will be monitored carefully while you are receiving this medication. This medication may increase the side effects of 5-fluorouracil. Tell your care team if you have diarrhea or mouth sores that do not get better or that get worse. What side effects may I notice from receiving this medication? Side effects that you should report to your care team as soon as possible: Allergic reactions--skin rash, itching, hives, swelling of the face, lips, tongue,  or throat This list may not describe all possible side effects. Call your doctor for medical advice about side effects. You may report side effects to FDA at 1-800-FDA-1088. Where should I keep my medication? This medication is given in a hospital or clinic. It will not be stored at home. NOTE: This sheet is a summary. It may not cover all possible information. If you have questions about this medicine, talk to your doctor, pharmacist, or health care provider.  2024 Elsevier/Gold Standard (2022-01-17 00:00:00)   Fluorouracil Injection What is this medication? FLUOROURACIL (flure oh YOOR a sil) treats some types of cancer. It works by slowing down the growth of cancer cells. This medicine may be used for other purposes; ask your health care provider or pharmacist if you have questions. COMMON BRAND NAME(S): Adrucil What  should I tell my care team before I take this medication? They need to know if you have any of these conditions: Blood disorders Dihydropyrimidine dehydrogenase (DPD) deficiency Infection, such as chickenpox, cold sores, herpes Kidney disease Liver disease Poor nutrition Recent or ongoing radiation therapy An unusual or allergic reaction to fluorouracil, other medications, foods, dyes, or preservatives If you or your partner are pregnant or trying to get pregnant Breast-feeding How should I use this medication? This medication is injected into a vein. It is administered by your care team in a hospital or clinic setting. Talk to your care team about the use of this medication in children. Special care may be needed. Overdosage: If you think you have taken too much of this medicine contact a poison control center or emergency room at once. NOTE: This medicine is only for you. Do not share this medicine with others. What if I miss a dose? Keep appointments for follow-up doses. It is important not to miss your dose. Call your care team if you are unable to keep an  appointment. What may interact with this medication? Do not take this medication with any of the following: Live virus vaccines This medication may also interact with the following: Medications that treat or prevent blood clots, such as warfarin, enoxaparin, dalteparin This list may not describe all possible interactions. Give your health care provider a list of all the medicines, herbs, non-prescription drugs, or dietary supplements you use. Also tell them if you smoke, drink alcohol, or use illegal drugs. Some items may interact with your medicine. What should I watch for while using this medication? Your condition will be monitored carefully while you are receiving this medication. This medication may make you feel generally unwell. This is not uncommon as chemotherapy can affect healthy cells as well as cancer cells. Report any side effects. Continue your course of treatment even though you feel ill unless your care team tells you to stop. In some cases, you may be given additional medications to help with side effects. Follow all directions for their use. This medication may increase your risk of getting an infection. Call your care team for advice if you get a fever, chills, sore throat, or other symptoms of a cold or flu. Do not treat yourself. Try to avoid being around people who are sick. This medication may increase your risk to bruise or bleed. Call your care team if you notice any unusual bleeding. Be careful brushing or flossing your teeth or using a toothpick because you may get an infection or bleed more easily. If you have any dental work done, tell your dentist you are receiving this medication. Avoid taking medications that contain aspirin, acetaminophen, ibuprofen, naproxen, or ketoprofen unless instructed by your care team. These medications may hide a fever. Do not treat diarrhea with over the counter products. Contact your care team if you have diarrhea that lasts more than 2  days or if it is severe and watery. This medication can make you more sensitive to the sun. Keep out of the sun. If you cannot avoid being in the sun, wear protective clothing and sunscreen. Do not use sun lamps, tanning beds, or tanning booths. Talk to your care team if you or your partner wish to become pregnant or think you might be pregnant. This medication can cause serious birth defects if taken during pregnancy and for 3 months after the last dose. A reliable form of contraception is recommended while taking this medication and for  3 months after the last dose. Talk to your care team about effective forms of contraception. Do not father a child while taking this medication and for 3 months after the last dose. Use a condom while having sex during this time period. Do not breastfeed while taking this medication. This medication may cause infertility. Talk to your care team if you are concerned about your fertility. What side effects may I notice from receiving this medication? Side effects that you should report to your care team as soon as possible: Allergic reactions--skin rash, itching, hives, swelling of the face, lips, tongue, or throat Heart attack--pain or tightness in the chest, shoulders, arms, or jaw, nausea, shortness of breath, cold or clammy skin, feeling faint or lightheaded Heart failure--shortness of breath, swelling of the ankles, feet, or hands, sudden weight gain, unusual weakness or fatigue Heart rhythm changes--fast or irregular heartbeat, dizziness, feeling faint or lightheaded, chest pain, trouble breathing High ammonia level--unusual weakness or fatigue, confusion, loss of appetite, nausea, vomiting, seizures Infection--fever, chills, cough, sore throat, wounds that don't heal, pain or trouble when passing urine, general feeling of discomfort or being unwell Low red blood cell level--unusual weakness or fatigue, dizziness, headache, trouble breathing Pain, tingling, or  numbness in the hands or feet, muscle weakness, change in vision, confusion or trouble speaking, loss of balance or coordination, trouble walking, seizures Redness, swelling, and blistering of the skin over hands and feet Severe or prolonged diarrhea Unusual bruising or bleeding Side effects that usually do not require medical attention (report to your care team if they continue or are bothersome): Dry skin Headache Increased tears Nausea Pain, redness, or swelling with sores inside the mouth or throat Sensitivity to light Vomiting This list may not describe all possible side effects. Call your doctor for medical advice about side effects. You may report side effects to FDA at 1-800-FDA-1088. Where should I keep my medication? This medication is given in a hospital or clinic. It will not be stored at home. NOTE: This sheet is a summary. It may not cover all possible information. If you have questions about this medicine, talk to your doctor, pharmacist, or health care provider.  2024 [employer]vier/Gold Standard (2021-12-20 00:00:00)   The chemotherapy medication bag should finish at 46 hours, 96 hours, or 7 days. For example, if your pump is scheduled for 46 hours and it was put on at 4:00 p.m., it should finish at 2:00 p.m. the day it is scheduled to come off regardless of your appointment time.     Estimated time to finish at 11:30 a.m. on Wednesday 05/09/2023.   If the display on your pump reads "Low Volume" and it is beeping, take the batteries out of the pump and come to the cancer center for it to be taken off.   If the pump alarms go off prior to the pump reading "Low Volume" then call [phone] and someone can assist you.  If the plunger comes out and the chemotherapy medication is leaking out, please use your home chemo spill kit to clean up the spill. Do NOT use paper towels or other household products.  If you have problems or questions regarding your pump, please call either  [phone] (24 hours a day) or the cancer center Monday-Friday 8:00 a.m.- 4:30 p.m. at the clinic number and we will assist you. If you are unable to get assistance, then go to the nearest Emergency Department and ask the staff to contact the IV team for assistance.

## 2023-05-08 ENCOUNTER — Encounter: Payer: Self-pay | Admitting: Nurse Practitioner

## 2023-05-08 ENCOUNTER — Encounter: Payer: Self-pay | Admitting: Oncology

## 2023-05-09 ENCOUNTER — Inpatient Hospital Stay: Payer: Medicare HMO

## 2023-05-09 ENCOUNTER — Other Ambulatory Visit: Payer: Self-pay

## 2023-05-09 VITALS — BP 110/70 | HR 67 | Temp 98.0°F | Resp 17

## 2023-05-09 DIAGNOSIS — Z5111 Encounter for antineoplastic chemotherapy: Secondary | ICD-10-CM | POA: Diagnosis not present

## 2023-05-09 DIAGNOSIS — Z79899 Other long term (current) drug therapy: Secondary | ICD-10-CM | POA: Diagnosis not present

## 2023-05-09 DIAGNOSIS — F410 Panic disorder [episodic paroxysmal anxiety] without agoraphobia: Secondary | ICD-10-CM | POA: Diagnosis not present

## 2023-05-09 DIAGNOSIS — D509 Iron deficiency anemia, unspecified: Secondary | ICD-10-CM | POA: Diagnosis not present

## 2023-05-09 DIAGNOSIS — Z800 Family history of malignant neoplasm of digestive organs: Secondary | ICD-10-CM | POA: Diagnosis not present

## 2023-05-09 DIAGNOSIS — C189 Malignant neoplasm of colon, unspecified: Secondary | ICD-10-CM

## 2023-05-09 DIAGNOSIS — E785 Hyperlipidemia, unspecified: Secondary | ICD-10-CM | POA: Diagnosis not present

## 2023-05-09 DIAGNOSIS — I10 Essential (primary) hypertension: Secondary | ICD-10-CM | POA: Diagnosis not present

## 2023-05-09 DIAGNOSIS — F32A Depression, unspecified: Secondary | ICD-10-CM | POA: Diagnosis not present

## 2023-05-09 DIAGNOSIS — K219 Gastro-esophageal reflux disease without esophagitis: Secondary | ICD-10-CM | POA: Diagnosis not present

## 2023-05-09 DIAGNOSIS — C182 Malignant neoplasm of ascending colon: Secondary | ICD-10-CM | POA: Diagnosis not present

## 2023-05-09 MED ORDER — SODIUM CHLORIDE 0.9% FLUSH
10.0000 mL | INTRAVENOUS | Status: DC | PRN
  Administered 2023-05-09: 10 mL

## 2023-05-09 MED ORDER — PEGFILGRASTIM-JMDB 6 MG/0.6ML ~~LOC~~ SOSY
6.0000 mg | PREFILLED_SYRINGE | Freq: Once | SUBCUTANEOUS | Status: AC
  Administered 2023-05-09: 6 mg via SUBCUTANEOUS
  Filled 2023-05-09: qty 0.6

## 2023-05-09 MED ORDER — HEPARIN SOD (PORK) LOCK FLUSH 100 UNIT/ML IV SOLN
500.0000 [IU] | Freq: Once | INTRAVENOUS | Status: AC | PRN
  Administered 2023-05-09: 500 [IU]

## 2023-05-09 NOTE — Patient Instructions (Signed)

## 2023-05-18 MED FILL — Dexamethasone Sodium Phosphate Inj 100 MG/10ML: INTRAMUSCULAR | Qty: 1 | Status: AC

## 2023-05-18 MED FILL — Fosaprepitant Dimeglumine For IV Infusion 150 MG (Base Eq): INTRAVENOUS | Qty: 5 | Status: AC

## 2023-05-20 ENCOUNTER — Other Ambulatory Visit: Payer: Self-pay | Admitting: Oncology

## 2023-05-21 ENCOUNTER — Inpatient Hospital Stay: Payer: Medicare HMO

## 2023-05-21 ENCOUNTER — Inpatient Hospital Stay (HOSPITAL_BASED_OUTPATIENT_CLINIC_OR_DEPARTMENT_OTHER): Payer: Medicare HMO | Admitting: Nurse Practitioner

## 2023-05-21 ENCOUNTER — Telehealth: Payer: Self-pay

## 2023-05-21 ENCOUNTER — Encounter: Payer: Self-pay | Admitting: Nurse Practitioner

## 2023-05-21 VITALS — BP 123/69 | HR 84 | Temp 98.2°F | Resp 18

## 2023-05-21 VITALS — BP 129/65 | HR 95 | Temp 98.1°F | Resp 18 | Ht 66.0 in | Wt 249.2 lb

## 2023-05-21 DIAGNOSIS — C189 Malignant neoplasm of colon, unspecified: Secondary | ICD-10-CM

## 2023-05-21 DIAGNOSIS — Z79899 Other long term (current) drug therapy: Secondary | ICD-10-CM | POA: Diagnosis not present

## 2023-05-21 DIAGNOSIS — C182 Malignant neoplasm of ascending colon: Secondary | ICD-10-CM | POA: Diagnosis not present

## 2023-05-21 DIAGNOSIS — Z800 Family history of malignant neoplasm of digestive organs: Secondary | ICD-10-CM | POA: Diagnosis not present

## 2023-05-21 DIAGNOSIS — K219 Gastro-esophageal reflux disease without esophagitis: Secondary | ICD-10-CM | POA: Diagnosis not present

## 2023-05-21 DIAGNOSIS — C787 Secondary malignant neoplasm of liver and intrahepatic bile duct: Secondary | ICD-10-CM

## 2023-05-21 DIAGNOSIS — I10 Essential (primary) hypertension: Secondary | ICD-10-CM | POA: Diagnosis not present

## 2023-05-21 DIAGNOSIS — E785 Hyperlipidemia, unspecified: Secondary | ICD-10-CM | POA: Diagnosis not present

## 2023-05-21 DIAGNOSIS — Z5111 Encounter for antineoplastic chemotherapy: Secondary | ICD-10-CM | POA: Diagnosis not present

## 2023-05-21 DIAGNOSIS — F410 Panic disorder [episodic paroxysmal anxiety] without agoraphobia: Secondary | ICD-10-CM | POA: Diagnosis not present

## 2023-05-21 DIAGNOSIS — D509 Iron deficiency anemia, unspecified: Secondary | ICD-10-CM | POA: Diagnosis not present

## 2023-05-21 DIAGNOSIS — F32A Depression, unspecified: Secondary | ICD-10-CM | POA: Diagnosis not present

## 2023-05-21 LAB — CBC WITH DIFFERENTIAL (CANCER CENTER ONLY)
Abs Immature Granulocytes: 0.06 10*3/uL (ref 0.00–0.07)
Basophils Absolute: 0.1 10*3/uL (ref 0.0–0.1)
Basophils Relative: 1 %
Eosinophils Absolute: 0.2 10*3/uL (ref 0.0–0.5)
Eosinophils Relative: 3 %
HCT: 29.6 % — ABNORMAL LOW (ref 39.0–52.0)
Hemoglobin: 9.2 g/dL — ABNORMAL LOW (ref 13.0–17.0)
Immature Granulocytes: 1 %
Lymphocytes Relative: 16 %
Lymphs Abs: 0.9 10*3/uL (ref 0.7–4.0)
MCH: 26.5 pg (ref 26.0–34.0)
MCHC: 31.1 g/dL (ref 30.0–36.0)
MCV: 85.3 fL (ref 80.0–100.0)
Monocytes Absolute: 0.3 10*3/uL (ref 0.1–1.0)
Monocytes Relative: 5 %
Neutro Abs: 4.1 10*3/uL (ref 1.7–7.7)
Neutrophils Relative %: 74 %
Platelet Count: 110 10*3/uL — ABNORMAL LOW (ref 150–400)
RBC: 3.47 MIL/uL — ABNORMAL LOW (ref 4.22–5.81)
RDW: 20.8 % — ABNORMAL HIGH (ref 11.5–15.5)
WBC Count: 5.5 10*3/uL (ref 4.0–10.5)
nRBC: 0 % (ref 0.0–0.2)

## 2023-05-21 LAB — CMP (CANCER CENTER ONLY)
ALT: 38 U/L (ref 0–44)
AST: 38 U/L (ref 15–41)
Albumin: 3.7 g/dL (ref 3.5–5.0)
Alkaline Phosphatase: 198 U/L — ABNORMAL HIGH (ref 38–126)
Anion gap: 6 (ref 5–15)
BUN: 11 mg/dL (ref 6–20)
CO2: 27 mmol/L (ref 22–32)
Calcium: 8.3 mg/dL — ABNORMAL LOW (ref 8.9–10.3)
Chloride: 105 mmol/L (ref 98–111)
Creatinine: 0.8 mg/dL (ref 0.61–1.24)
GFR, Estimated: >60 mL/min (ref >60)
Glucose, Bld: 234 mg/dL — ABNORMAL HIGH (ref 70–99)
Potassium: 3.7 mmol/L (ref 3.5–5.1)
Sodium: 138 mmol/L (ref 135–145)
Total Bilirubin: 0.8 mg/dL (ref 0.3–1.2)
Total Protein: 6.7 g/dL (ref 6.5–8.1)

## 2023-05-21 LAB — CEA (ACCESS): CEA (CHCC): 12.16 ng/mL — ABNORMAL HIGH (ref 0.00–5.00)

## 2023-05-21 LAB — TOTAL PROTEIN, URINE DIPSTICK: Protein, ur: NEGATIVE mg/dL

## 2023-05-21 LAB — FERRITIN: Ferritin: 43 ng/mL (ref 24–336)

## 2023-05-21 MED ORDER — SODIUM CHLORIDE 0.9 % IV SOLN
5.0000 mg/kg | Freq: Once | INTRAVENOUS | Status: AC
  Administered 2023-05-21: 500 mg via INTRAVENOUS
  Filled 2023-05-21: qty 16

## 2023-05-21 MED ORDER — SODIUM CHLORIDE 0.9 % IV SOLN
150.0000 mg | Freq: Once | INTRAVENOUS | Status: AC
  Administered 2023-05-21: 150 mg via INTRAVENOUS
  Filled 2023-05-21: qty 150

## 2023-05-21 MED ORDER — FLUOROURACIL CHEMO INJECTION 500 MG/10ML
200.0000 mg/m2 | Freq: Once | INTRAVENOUS | Status: AC
  Administered 2023-05-21: 450 mg via INTRAVENOUS
  Filled 2023-05-21: qty 9

## 2023-05-21 MED ORDER — SODIUM CHLORIDE 0.9 % IV SOLN: Freq: Once | INTRAVENOUS | Status: AC

## 2023-05-21 MED ORDER — SODIUM CHLORIDE 0.9 % IV SOLN
200.0000 mg/m2 | Freq: Once | INTRAVENOUS | Status: AC
  Administered 2023-05-21: 444 mg via INTRAVENOUS
  Filled 2023-05-21: qty 22.2

## 2023-05-21 MED ORDER — ATROPINE SULFATE 1 MG/ML IV SOLN
0.5000 mg | Freq: Once | INTRAVENOUS | Status: AC | PRN
  Administered 2023-05-21: 0.5 mg via INTRAVENOUS
  Filled 2023-05-21: qty 1

## 2023-05-21 MED ORDER — SODIUM CHLORIDE 0.9 % IV SOLN
180.0000 mg/m2 | Freq: Once | INTRAVENOUS | Status: AC
  Administered 2023-05-21: 400 mg via INTRAVENOUS
  Filled 2023-05-21: qty 15

## 2023-05-21 MED ORDER — PALONOSETRON HCL INJECTION 0.25 MG/5ML
0.2500 mg | Freq: Once | INTRAVENOUS | Status: AC
  Administered 2023-05-21: 0.25 mg via INTRAVENOUS
  Filled 2023-05-21: qty 5

## 2023-05-21 MED ORDER — SODIUM CHLORIDE 0.9 % IV SOLN
1600.0000 mg/m2 | INTRAVENOUS | Status: DC
  Administered 2023-05-21: 3500 mg via INTRAVENOUS
  Filled 2023-05-21: qty 20

## 2023-05-21 MED ORDER — SODIUM CHLORIDE 0.9 % IV SOLN
10.0000 mg | Freq: Once | INTRAVENOUS | Status: AC
  Administered 2023-05-21: 10 mg via INTRAVENOUS
  Filled 2023-05-21: qty 10

## 2023-05-21 NOTE — Progress Notes (Signed)
[facility] OFFICE PROGRESS NOTE   Diagnosis: Colon cancer  INTERVAL HISTORY:   [name] returns as scheduled.  He completed another cycle of FOLFIRI/bevacizumab 05/07/2023.  He reports baseline nausea which worsens the evening of treatment and last for about 10 days.  He does not vomit.  Phenergan seems to be effective.  No mouth sores.  No diarrhea.  Mild bleeding with nose blowing.  Objective:  Vital signs in last 24 hours:  Blood pressure 129/65, pulse 95, temperature 98.1 F (36.7 C), temperature source Temporal, resp. rate 18, height 5\' 6"  (1.676 m), weight 249 lb 3.2 oz (113 kg), SpO2 99%.    HEENT: No thrush or ulcers. Resp: Lungs clear bilaterally. Cardio: Regular rate and rhythm. GI: No hepatosplenomegaly.  No mass. Vascular: No leg edema. Skin: Palms without erythema. Port-A-Cath without erythema.  Lab Results:  Lab Results  Component Value Date   WBC 5.5 05/21/2023   HGB 9.2 (L) 05/21/2023   HCT 29.6 (L) 05/21/2023   MCV 85.3 05/21/2023   PLT 110 (L) 05/21/2023   NEUTROABS 4.1 05/21/2023    Imaging:  No results found.  Medications: I have reviewed the patient's current medications.  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 secondary to nausea (patient discontinued Xeloda on day 11 due to colonoscopy) Colonoscopy 07/09/2020-nodular mucosa at the colonic anastomosis, biopsied; patent end-to-side ileocolonic anastomosis characterized by healthy-appearing  mucosa and visible sutures; nonbleeding internal hemorrhoids, biopsy with benign ulcerated anastomotic mucosa with granulation tissue Cycle 4 adjuvant Xeloda 07/19/2020, 2000 mg every morning and 1500 mg every afternoon Cycle 5 adjuvant Xeloda 08/09/2020 Cycle 6 adjuvant Xeloda 08/31/2019 Cycle 7 adjuvant Xeloda 09/20/2020 Cycle 8 adjuvant Xeloda 10/11/2020 CTs 03/07/2021-no evidence of recurrent disease, hepatic steatosis, slight wall thickening at the junction of the descending and horizontal duodenum Mild elevation of CEA 2023 12/01/2021-Guardant Reveal-ct DNA detected PET 12/30/2021-hypermetabolic right liver lesion, hypermetabolic area in a loop of mid small bowel with an SUV of 12.5, review of PET images at GI tumor conference 01/11/2022-small bowel uptake felt to be a benign finding MRI liver 01/10/2022-solitary 1.2 cm right liver lesion between segments 7 and 8 compatible with metastasis, subtle changes suggesting cirrhosis, hepatic steatosis CT enteroscopy 02/02/2022-small bowel loop with hypermetabolic activity on PET continues to have a bandlike density along the margin felt to represent a diverticulum or adjacent nodal tissue.  Small bowel tumor not excluded.  3-4 mm left omental nodule 02/08/2022-biopsy and ablation of solitary liver lesion, adenocarcinoma consistent with metastatic colorectal adenocarcinoma CTs 03/31/2022-unchanged circumstantial soft tissue thickening surrounding a loop of mid to distal small bowel with an adjacent nodular focus measuring 1.2 cm.,  Ablation cavity in segment 7, no other evidence of metastatic disease Diagnostic laparoscopy 05/26/2022-mid small bowel mass-resected, well to moderately differentiated adenocarcinoma, 0/2 lymph nodes, morphology consistent with a colon primary, tumor involved full-thickness including serosa with perineural and large vessel involvement; foundation 1-microsatellite stable, tumor mutation burden 2, K-ras Q61H CT abdomen/pelvis 07/10/2022-the  right liver ablation defect is smaller, new subtle right liver lesion measuring 13 x 9 mm, enlarged lymph node in the  right upper quadrant mesentery adjacent to surgical anastomosis PET 07/26/2022-new segment 6 liver lesion, enlarging hypermetabolic left omental lesion, there is another mildly hypermetabolic peritoneal implant, 7 mm omental nodule at the midline without hypermetabolism Cycle 1 FOLFOX 09/25/2022 Cycle 2 FOLFOX/bevacizumab 10/09/2022, 5-FU dose reduced secondary to mucositis Cycle 3 FOLFOX/bevacizumab 10/23/2022 Cycle 4 FOLFOX/bevacizumab 11/06/2022 Cycle 5 FOLFOX/bevacizumab 11/20/2022 Cycle 6 FOLFOX/bevacizumab 12/04/2022 CT abdomen/pelvis 12/14/2022-further contraction of the treated segment 7 lesion, slight enlargement of segment 6 lesion, no new liver lesion, increased size of ileocolic nodes and an omental lesion, stable small retroperitoneal nodes Cycle 7 FOLFOX/bevacizumab 12/18/2022 Cycle 8 FOLFOX/bevacizumab 01/02/2023, oxaliplatin dose reduced secondary to prolonged nausea and neuropathy symptoms, Emend added Cycle 9 FOLFOX/bevacizumab 01/16/2023, oxaliplatin discontinued secondary to neuropathy Cycle 1 FOLFIRI 02/19/2023, Avastin held due to recent dental extractions Cycle 2 FOLFIRI/Avastin 03/05/2023 Cycle 3 FOLFIRI/Avastin 03/19/2023, 5-FU bolus held due to mucositis after cycle 2, Fulphila added Cycle 4 FOLFIRI/Avastin 04/02/2023, Fulphila, Emend Cycle 5 FOLFIRI/Avastin 04/16/2023, Fulphila, Emend CTs 05/22/2023-similar ileocolic mesenteric adenopathy and omental metastasis, cirrhosis with portal hypertension, stable liver lesions, no new or progressive disease Cycle 6 FOLFIRI/Avastin 05/07/2023, Fulphila, Emend Cycle 7 FOLFIRI/Avastin 05/21/2023, Fulphila, Emend Microcytic anemia secondary to #1-improved Colon polyps-sessile serrated adenoma of the descending colon, tubular adenomas with focal high-grade dysplasia of the transverse colon on colonoscopy 02/12/2020, tubular adenoma of the  mid ascending colon on the surgical specimen 04/12/2020 Family history of colon cancer Depression Anxiety/panic attacks Hypertension Hyperlipidemia Nausea secondary to Xeloda?-Resolved Gastroesophageal reflux disease-improved following discontinuation of Xeloda Anemia, microcytic; ferritin 6 01/24/2022; stool positive for blood x3 01/25/2022; 02/02/2022 CT abdomen/pelvis enterography-bowel loop demonstrating hypermetabolic activity continues to have a bandlike density along its margin possibly representing a diverticulum or adjacent nodal tissue, difficult to exclude small bowel tumor, 3 x 4 mm left omental nodule or lymph node, known right hepatic lobe tumor is occult on CT. Venofer weekly x3 beginning 03/03/2022 Negative capsule endoscopy 03/09/2022 Venofer 03/03/2022 for 3 weekly doses Venofer 05/03/2022, 05/19/2022 Venofer 07/28/2022, 08/04/2022 12.  Anterior left neck nodule 03/17/2022-cyst?,  Lymph node? 13.  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  He has completed 6 cycles of FOLFIRI/Avastin.  He continues to have persistent nausea following chemotherapy.  We again discussed Zyprexa which he declines.  He notes that Phenergan helps.  He will try taking Phenergan on a scheduled basis for several days following treatment.  When he comes in to have the pump discontinued on 05/23/2023 if he is having nausea we will check his blood sugar and if in an acceptable range give Decadron 4 mg IV and a liter of IV fluids.  He agrees with this plan.  Plan to proceed with cycle 7 FOLFIRI/Avastin today as scheduled.  CBC and chemistry panel reviewed.  Labs adequate to proceed  as above.  He has progression of anemia.  We will check a ferritin.  He has mild thrombocytopenia.  He will contact the office with bleeding.  He will return for follow-up and treatment as scheduled in 2 weeks.    Lonna Cobb ANP/GNP-BC   05/21/2023  11:41 AM

## 2023-05-21 NOTE — Progress Notes (Signed)
Patient seen by Lonna Cobb NP today  Vitals are within treatment parameters:Yes   Labs are within treatment parameters: Yes   Treatment plan has been signed: Yes   Per physician team, Patient is ready for treatment and there are NO modifications to the treatment plan.

## 2023-05-21 NOTE — Telephone Encounter (Signed)
Patient gave verbal understanding and and no further questions or concerns.

## 2023-05-21 NOTE — Patient Instructions (Addendum)
[facility]  The chemotherapy medication bag should finish at 46 hours, 96 hours, or 7 days. For example, if your pump is scheduled for 46 hours and it was put on at 4:00 p.m., it should finish at 2:00 p.m. the day it is scheduled to come off regardless of your appointment time.     Estimated time to finish at 1:45 Wednesday, May 23, 2023.   If the display on your pump reads "Low Volume" and it is beeping, take the batteries out of the pump and come to the cancer center for it to be taken off.   If the pump alarms go off prior to the pump reading "Low Volume" then call [phone] and someone can assist you.  If the plunger comes out and the chemotherapy medication is leaking out, please use your home chemo spill kit to clean up the spill. Do NOT use paper towels or other household products.  If you have problems or questions regarding your pump, please call either [phone] (24 hours a day) or the cancer center Monday-Friday 8:00 a.m.- 4:30 p.m. at the clinic number and we will assist you. If you are unable to get assistance, then go to the nearest Emergency Department and ask the staff to contact the IV team for assistance.   Discharge Instructions: Thank you for choosing [facility] to provide your oncology and hematology care.   If you have a lab appointment with the Cancer Center, please go directly to the Cancer Center and check in at the registration area.   Wear comfortable clothing and clothing appropriate for easy access to any Portacath or PICC line.   We strive to give you quality time with your provider. You may need to reschedule your appointment if you arrive late (15 or more minutes).  Arriving late affects you and other patients whose appointments are after yours.  Also, if you miss three or more appointments without notifying the office, you may be dismissed from the clinic at the provider's discretion.      For  prescription refill requests, have your pharmacy contact our office and allow 72 hours for refills to be completed.    Today you received the following chemotherapy and/or immunotherapy agents Avastin, Irinotecan, Leucovorin, Flurouracil.      To help prevent nausea and vomiting after your treatment, we encourage you to take your nausea medication as directed.  BELOW ARE SYMPTOMS THAT SHOULD BE REPORTED IMMEDIATELY: *FEVER GREATER THAN 100.4 F (38 C) OR HIGHER *CHILLS OR SWEATING *NAUSEA AND VOMITING THAT IS NOT CONTROLLED WITH YOUR NAUSEA MEDICATION *UNUSUAL SHORTNESS OF BREATH *UNUSUAL BRUISING OR BLEEDING *URINARY PROBLEMS (pain or burning when urinating, or frequent urination) *BOWEL PROBLEMS (unusual diarrhea, constipation, pain near the anus) TENDERNESS IN MOUTH AND THROAT WITH OR WITHOUT PRESENCE OF ULCERS (sore throat, sores in mouth, or a toothache) UNUSUAL RASH, SWELLING OR PAIN  UNUSUAL VAGINAL DISCHARGE OR ITCHING   Items with * indicate a potential emergency and should be followed up as soon as possible or go to the Emergency Department if any problems should occur.  Please show the CHEMOTHERAPY ALERT CARD or IMMUNOTHERAPY ALERT CARD at check-in to the Emergency Department and triage nurse.  Should you have questions after your visit or need to cancel or reschedule your appointment, please contact [facility]  Dept: [phone]  and follow the prompts.  Office hours are 8:00 a.m. to 4:30 p.m. Monday - Friday. Please  note that voicemails left after 4:00 p.m. may not be returned until the following business day.  We are closed weekends and major holidays. You have access to a nurse at all times for urgent questions. Please call the main number to the clinic Dept: [phone] and follow the prompts.   For any non-urgent questions, you may also contact your provider using MyChart. We now offer e-Visits for anyone 9 and older to request care  online for non-urgent symptoms. For details visit [email or web address].   Also download the MyChart app! Go to the app store, search "MyChart", open the app, select [facility], and log in with your MyChart username and password.  Bevacizumab Injection What is this medication? BEVACIZUMAB (be va SIZ yoo mab) treats some types of cancer. It works by blocking a protein that causes cancer cells to grow and multiply. This helps to slow or stop the spread of cancer cells. It is a monoclonal antibody. This medicine may be used for other purposes; ask your health care provider or pharmacist if you have questions. COMMON BRAND NAME(S): Alymsys, Avastin, MVASI, Omer Jack What should I tell my care team before I take this medication? They need to know if you have any of these conditions: Blood clots Coughing up blood Having or recent surgery Heart failure High blood pressure History of a connection between 2 or more body parts that do not usually connect (fistula) History of a tear in your stomach or intestines Protein in your urine An unusual or allergic reaction to bevacizumab, other medications, foods, dyes, or preservatives Pregnant or trying to get pregnant Breast-feeding How should I use this medication? This medication is injected into a vein. It is given by your care team in a hospital or clinic setting. Talk to your care team the use of this medication in children. Special care may be needed. Overdosage: If you think you have taken too much of this medicine contact a poison control center or emergency room at once. NOTE: This medicine is only for you. Do not share this medicine with others. What if I miss a dose? Keep appointments for follow-up doses. It is important not to miss your dose. Call your care team if you are unable to keep an appointment. What may interact with this medication? Interactions are not expected. This list may not describe all possible interactions. Give your  health care provider a list of all the medicines, herbs, non-prescription drugs, or dietary supplements you use. Also tell them if you smoke, drink alcohol, or use illegal drugs. Some items may interact with your medicine. What should I watch for while using this medication? Your condition will be monitored carefully while you are receiving this medication. You may need blood work while taking this medication. This medication may make you feel generally unwell. This is not uncommon as chemotherapy can affect healthy cells as well as cancer cells. Report any side effects. Continue your course of treatment even though you feel ill unless your care team tells you to stop. This medication may increase your risk to bruise or bleed. Call your care team if you notice any unusual bleeding. Before having surgery, talk to your care team to make sure it is ok. This medication can increase the risk of poor healing of your surgical site or wound. You will need to stop this medication for 28 days before surgery. After surgery, wait at least 28 days before restarting this medication. Make sure the surgical site or wound is healed enough before restarting  this medication. Talk to your care team if questions. Talk to your care team if you may be pregnant. Serious birth defects can occur if you take this medication during pregnancy and for 6 months after the last dose. Contraception is recommended while taking this medication and for 6 months after the last dose. Your care team can help you find the option that works for you. Do not breastfeed while taking this medication and for 6 months after the last dose. This medication can cause infertility. Talk to your care team if you are concerned about your fertility. What side effects may I notice from receiving this medication? Side effects that you should report to your care team as soon as possible: Allergic reactions--skin rash, itching, hives, swelling of the face, lips,  tongue, or throat Bleeding--bloody or black, tar-like stools, vomiting blood or brown material that looks like coffee grounds, red or dark brown urine, small red or purple spots on skin, unusual bruising or bleeding Blood clot--pain, swelling, or warmth in the leg, shortness of breath, chest pain Heart attack--pain or tightness in the chest, shoulders, arms, or jaw, nausea, shortness of breath, cold or clammy skin, feeling faint or lightheaded Heart failure--shortness of breath, swelling of the ankles, feet, or hands, sudden weight gain, unusual weakness or fatigue Increase in blood pressure Infection--fever, chills, cough, sore throat, wounds that don't heal, pain or trouble when passing urine, general feeling of discomfort or being unwell Infusion reactions--chest pain, shortness of breath or trouble breathing, feeling faint or lightheaded Kidney injury--decrease in the amount of urine, swelling of the ankles, hands, or feet Stomach pain that is severe, does not go away, or gets worse Stroke--sudden numbness or weakness of the face, arm, or leg, trouble speaking, confusion, trouble walking, loss of balance or coordination, dizziness, severe headache, change in vision Sudden and severe headache, confusion, change in vision, seizures, which may be signs of posterior reversible encephalopathy syndrome (PRES) Side effects that usually do not require medical attention (report to your care team if they continue or are bothersome): Back pain Change in taste Diarrhea Dry skin Increased tears Nosebleed This list may not describe all possible side effects. Call your doctor for medical advice about side effects. You may report side effects to FDA at 1-800-FDA-1088. Where should I keep my medication? This medication is given in a hospital or clinic. It will not be stored at home. NOTE: This sheet is a summary. It may not cover all possible information. If you have questions about this medicine, talk to  your doctor, pharmacist, or health care provider.  2024 Elsevier/Gold Standard (2021-12-30 00:00:00)  Irinotecan Injection What is this medication? IRINOTECAN (ir in oh TEE kan) treats some types of cancer. It works by slowing down the growth of cancer cells. This medicine may be used for other purposes; ask your health care provider or pharmacist if you have questions. COMMON BRAND NAME(S): Camptosar What should I tell my care team before I take this medication? They need to know if you have any of these conditions: Dehydration Diarrhea Infection, especially a viral infection, such as chickenpox, cold sores, herpes Liver disease Low blood cell levels (white cells, red cells, and platelets) Low levels of electrolytes, such as calcium, magnesium, or potassium in your blood Recent or ongoing radiation An unusual or allergic reaction to irinotecan, other medications, foods, dyes, or preservatives If you or your partner are pregnant or trying to get pregnant Breast-feeding How should I use this medication? This medication is injected  into a vein. It is given by your care team in a hospital or clinic setting. Talk to your care team about the use of this medication in children. Special care may be needed. Overdosage: If you think you have taken too much of this medicine contact a poison control center or emergency room at once. NOTE: This medicine is only for you. Do not share this medicine with others. What if I miss a dose? Keep appointments for follow-up doses. It is important not to miss your dose. Call your care team if you are unable to keep an appointment. What may interact with this medication? Do not take this medication with any of the following: Cobicistat Itraconazole This medication may also interact with the following: Certain antibiotics, such as clarithromycin, rifampin, rifabutin Certain antivirals for HIV or AIDS Certain medications for fungal infections, such as  ketoconazole, posaconazole, voriconazole Certain medications for seizures, such as carbamazepine, phenobarbital, phenytoin Gemfibrozil Nefazodone St. John's wort This list may not describe all possible interactions. Give your health care provider a list of all the medicines, herbs, non-prescription drugs, or dietary supplements you use. Also tell them if you smoke, drink alcohol, or use illegal drugs. Some items may interact with your medicine. What should I watch for while using this medication? Your condition will be monitored carefully while you are receiving this medication. You may need blood work while taking this medication. This medication may make you feel generally unwell. This is not uncommon as chemotherapy can affect healthy cells as well as cancer cells. Report any side effects. Continue your course of treatment even though you feel ill unless your care team tells you to stop. This medication can cause serious side effects. To reduce the risk, your care team may give you other medications to take before receiving this one. Be sure to follow the directions from your care team. This medication may affect your coordination, reaction time, or judgement. Do not drive or operate machinery until you know how this medication affects you. Sit up or stand slowly to reduce the risk of dizzy or fainting spells. Drinking alcohol with this medication can increase the risk of these side effects. This medication may increase your risk of getting an infection. Call your care team for advice if you get a fever, chills, sore throat, or other symptoms of a cold or flu. Do not treat yourself. Try to avoid being around people who are sick. Avoid taking medications that contain aspirin, acetaminophen, ibuprofen, naproxen, or ketoprofen unless instructed by your care team. These medications may hide a fever. This medication may increase your risk to bruise or bleed. Call your care team if you notice any unusual  bleeding. Be careful brushing or flossing your teeth or using a toothpick because you may get an infection or bleed more easily. If you have any dental work done, tell your dentist you are receiving this medication. Talk to your care team if you or your partner are pregnant or think either of you might be pregnant. This medication can cause serious birth defects if taken during pregnancy and for 6 months after the last dose. You will need a negative pregnancy test before starting this medication. Contraception is recommended while taking this medication and for 6 months after the last dose. Your care team can help you find the option that works for you. Do not father a child while taking this medication and for 3 months after the last dose. Use a condom for contraception during this time  period. Do not breastfeed while taking this medication and for 7 days after the last dose. This medication may cause infertility. Talk to your care team if you are concerned about your fertility. What side effects may I notice from receiving this medication? Side effects that you should report to your care team as soon as possible: Allergic reactions--skin rash, itching, hives, swelling of the face, lips, tongue, or throat Dry cough, shortness of breath or trouble breathing Increased saliva or tears, increased sweating, stomach cramping, diarrhea, small pupils, unusual weakness or fatigue, slow heartbeat Infection--fever, chills, cough, sore throat, wounds that don't heal, pain or trouble when passing urine, general feeling of discomfort or being unwell Kidney injury--decrease in the amount of urine, swelling of the ankles, hands, or feet Low red blood cell level--unusual weakness or fatigue, dizziness, headache, trouble breathing Severe or prolonged diarrhea Unusual bruising or bleeding Side effects that usually do not require medical attention (report to your care team if they continue or are  bothersome): Constipation Diarrhea Hair loss Loss of appetite Nausea Stomach pain This list may not describe all possible side effects. Call your doctor for medical advice about side effects. You may report side effects to FDA at 1-800-FDA-1088. Where should I keep my medication? This medication is given in a hospital or clinic. It will not be stored at home. NOTE: This sheet is a summary. It may not cover all possible information. If you have questions about this medicine, talk to your doctor, pharmacist, or health care provider.  2024 Elsevier/Gold Standard (2021-12-26 00:00:00)  Leucovorin Injection What is this medication? LEUCOVORIN (loo koe VOR in) prevents side effects from certain medications, such as methotrexate. It works by increasing folate levels. This helps protect healthy cells in your body. It may also be used to treat anemia caused by low levels of folate. It can also be used with fluorouracil, a type of chemotherapy, to treat colorectal cancer. It works by increasing the effects of fluorouracil in the body. This medicine may be used for other purposes; ask your health care provider or pharmacist if you have questions. What should I tell my care team before I take this medication? They need to know if you have any of these conditions: Anemia from low levels of vitamin B12 in the blood An unusual or allergic reaction to leucovorin, folic acid, other medications, foods, dyes, or preservatives Pregnant or trying to get pregnant Breastfeeding How should I use this medication? This medication is injected into a vein or a muscle. It is given by your care team in a hospital or clinic setting. Talk to your care team about the use of this medication in children. Special care may be needed. Overdosage: If you think you have taken too much of this medicine contact a poison control center or emergency room at once. NOTE: This medicine is only for you. Do not share this medicine with  others. What if I miss a dose? Keep appointments for follow-up doses. It is important not to miss your dose. Call your care team if you are unable to keep an appointment. What may interact with this medication? Capecitabine Fluorouracil Phenobarbital Phenytoin Primidone Trimethoprim;sulfamethoxazole This list may not describe all possible interactions. Give your health care provider a list of all the medicines, herbs, non-prescription drugs, or dietary supplements you use. Also tell them if you smoke, drink alcohol, or use illegal drugs. Some items may interact with your medicine. What should I watch for while using this medication? Your condition will  be monitored carefully while you are receiving this medication. This medication may increase the side effects of 5-fluorouracil. Tell your care team if you have diarrhea or mouth sores that do not get better or that get worse. What side effects may I notice from receiving this medication? Side effects that you should report to your care team as soon as possible: Allergic reactions--skin rash, itching, hives, swelling of the face, lips, tongue, or throat This list may not describe all possible side effects. Call your doctor for medical advice about side effects. You may report side effects to FDA at 1-800-FDA-1088. Where should I keep my medication? This medication is given in a hospital or clinic. It will not be stored at home. NOTE: This sheet is a summary. It may not cover all possible information. If you have questions about this medicine, talk to your doctor, pharmacist, or health care provider.  2024 Elsevier/Gold Standard (2022-01-17 00:00:00)  Fluorouracil Injection What is this medication? FLUOROURACIL (flure oh YOOR a sil) treats some types of cancer. It works by slowing down the growth of cancer cells. This medicine may be used for other purposes; ask your health care provider or pharmacist if you have questions. COMMON BRAND  NAME(S): Adrucil What should I tell my care team before I take this medication? They need to know if you have any of these conditions: Blood disorders Dihydropyrimidine dehydrogenase (DPD) deficiency Infection, such as chickenpox, cold sores, herpes Kidney disease Liver disease Poor nutrition Recent or ongoing radiation therapy An unusual or allergic reaction to fluorouracil, other medications, foods, dyes, or preservatives If you or your partner are pregnant or trying to get pregnant Breast-feeding How should I use this medication? This medication is injected into a vein. It is administered by your care team in a hospital or clinic setting. Talk to your care team about the use of this medication in children. Special care may be needed. Overdosage: If you think you have taken too much of this medicine contact a poison control center or emergency room at once. NOTE: This medicine is only for you. Do not share this medicine with others. What if I miss a dose? Keep appointments for follow-up doses. It is important not to miss your dose. Call your care team if you are unable to keep an appointment. What may interact with this medication? Do not take this medication with any of the following: Live virus vaccines This medication may also interact with the following: Medications that treat or prevent blood clots, such as warfarin, enoxaparin, dalteparin This list may not describe all possible interactions. Give your health care provider a list of all the medicines, herbs, non-prescription drugs, or dietary supplements you use. Also tell them if you smoke, drink alcohol, or use illegal drugs. Some items may interact with your medicine. What should I watch for while using this medication? Your condition will be monitored carefully while you are receiving this medication. This medication may make you feel generally unwell. This is not uncommon as chemotherapy can affect healthy cells as well as  cancer cells. Report any side effects. Continue your course of treatment even though you feel ill unless your care team tells you to stop. In some cases, you may be given additional medications to help with side effects. Follow all directions for their use. This medication may increase your risk of getting an infection. Call your care team for advice if you get a fever, chills, sore throat, or other symptoms of a cold or flu. Do  not treat yourself. Try to avoid being around people who are sick. This medication may increase your risk to bruise or bleed. Call your care team if you notice any unusual bleeding. Be careful brushing or flossing your teeth or using a toothpick because you may get an infection or bleed more easily. If you have any dental work done, tell your dentist you are receiving this medication. Avoid taking medications that contain aspirin, acetaminophen, ibuprofen, naproxen, or ketoprofen unless instructed by your care team. These medications may hide a fever. Do not treat diarrhea with over the counter products. Contact your care team if you have diarrhea that lasts more than 2 days or if it is severe and watery. This medication can make you more sensitive to the sun. Keep out of the sun. If you cannot avoid being in the sun, wear protective clothing and sunscreen. Do not use sun lamps, tanning beds, or tanning booths. Talk to your care team if you or your partner wish to become pregnant or think you might be pregnant. This medication can cause serious birth defects if taken during pregnancy and for 3 months after the last dose. A reliable form of contraception is recommended while taking this medication and for 3 months after the last dose. Talk to your care team about effective forms of contraception. Do not father a child while taking this medication and for 3 months after the last dose. Use a condom while having sex during this time period. Do not breastfeed while taking this  medication. This medication may cause infertility. Talk to your care team if you are concerned about your fertility. What side effects may I notice from receiving this medication? Side effects that you should report to your care team as soon as possible: Allergic reactions--skin rash, itching, hives, swelling of the face, lips, tongue, or throat Heart attack--pain or tightness in the chest, shoulders, arms, or jaw, nausea, shortness of breath, cold or clammy skin, feeling faint or lightheaded Heart failure--shortness of breath, swelling of the ankles, feet, or hands, sudden weight gain, unusual weakness or fatigue Heart rhythm changes--fast or irregular heartbeat, dizziness, feeling faint or lightheaded, chest pain, trouble breathing High ammonia level--unusual weakness or fatigue, confusion, loss of appetite, nausea, vomiting, seizures Infection--fever, chills, cough, sore throat, wounds that don't heal, pain or trouble when passing urine, general feeling of discomfort or being unwell Low red blood cell level--unusual weakness or fatigue, dizziness, headache, trouble breathing Pain, tingling, or numbness in the hands or feet, muscle weakness, change in vision, confusion or trouble speaking, loss of balance or coordination, trouble walking, seizures Redness, swelling, and blistering of the skin over hands and feet Severe or prolonged diarrhea Unusual bruising or bleeding Side effects that usually do not require medical attention (report to your care team if they continue or are bothersome): Dry skin Headache Increased tears Nausea Pain, redness, or swelling with sores inside the mouth or throat Sensitivity to light Vomiting This list may not describe all possible side effects. Call your doctor for medical advice about side effects. You may report side effects to FDA at 1-800-FDA-1088. Where should I keep my medication? This medication is given in a hospital or clinic. It will not be stored  at home. NOTE: This sheet is a summary. It may not cover all possible information. If you have questions about this medicine, talk to your doctor, pharmacist, or health care provider.  2024 Elsevier/Gold Standard (2021-12-20 00:00:00)

## 2023-05-21 NOTE — Progress Notes (Signed)
Maintain Mvasi dose at 500 mg despite slight weight increase.  T.O. Lonna Cobb, NP/Finneas Mathe Yetta Barre, PharmD

## 2023-05-21 NOTE — Telephone Encounter (Signed)
-----   Message from Lonna Cobb sent at 05/21/2023  3:04 PM EDT ----- Please let him know ferritin is higher in normal range.  The anemia may be due to chemotherapy.

## 2023-05-21 NOTE — Telephone Encounter (Signed)
Patient does not need a refill

## 2023-05-22 ENCOUNTER — Other Ambulatory Visit: Payer: Self-pay

## 2023-05-23 ENCOUNTER — Inpatient Hospital Stay: Payer: Medicare HMO

## 2023-05-23 VITALS — BP 112/60 | HR 75 | Temp 97.7°F | Resp 18

## 2023-05-23 DIAGNOSIS — H52223 Regular astigmatism, bilateral: Secondary | ICD-10-CM | POA: Diagnosis not present

## 2023-05-23 DIAGNOSIS — K219 Gastro-esophageal reflux disease without esophagitis: Secondary | ICD-10-CM | POA: Diagnosis not present

## 2023-05-23 DIAGNOSIS — C182 Malignant neoplasm of ascending colon: Secondary | ICD-10-CM | POA: Diagnosis not present

## 2023-05-23 DIAGNOSIS — D509 Iron deficiency anemia, unspecified: Secondary | ICD-10-CM | POA: Diagnosis not present

## 2023-05-23 DIAGNOSIS — Z800 Family history of malignant neoplasm of digestive organs: Secondary | ICD-10-CM | POA: Diagnosis not present

## 2023-05-23 DIAGNOSIS — Z5111 Encounter for antineoplastic chemotherapy: Secondary | ICD-10-CM | POA: Diagnosis not present

## 2023-05-23 DIAGNOSIS — I10 Essential (primary) hypertension: Secondary | ICD-10-CM | POA: Diagnosis not present

## 2023-05-23 DIAGNOSIS — F410 Panic disorder [episodic paroxysmal anxiety] without agoraphobia: Secondary | ICD-10-CM | POA: Diagnosis not present

## 2023-05-23 DIAGNOSIS — F32A Depression, unspecified: Secondary | ICD-10-CM | POA: Diagnosis not present

## 2023-05-23 DIAGNOSIS — E785 Hyperlipidemia, unspecified: Secondary | ICD-10-CM | POA: Diagnosis not present

## 2023-05-23 DIAGNOSIS — C189 Malignant neoplasm of colon, unspecified: Secondary | ICD-10-CM

## 2023-05-23 DIAGNOSIS — H524 Presbyopia: Secondary | ICD-10-CM | POA: Diagnosis not present

## 2023-05-23 DIAGNOSIS — Z79899 Other long term (current) drug therapy: Secondary | ICD-10-CM | POA: Diagnosis not present

## 2023-05-23 MED ORDER — HEPARIN SOD (PORK) LOCK FLUSH 100 UNIT/ML IV SOLN
500.0000 [IU] | Freq: Once | INTRAVENOUS | Status: AC | PRN
  Administered 2023-05-23: 500 [IU]

## 2023-05-23 MED ORDER — PEGFILGRASTIM-JMDB 6 MG/0.6ML ~~LOC~~ SOSY
6.0000 mg | PREFILLED_SYRINGE | Freq: Once | SUBCUTANEOUS | Status: AC
  Administered 2023-05-23: 6 mg via SUBCUTANEOUS
  Filled 2023-05-23: qty 0.6

## 2023-05-23 MED ORDER — SODIUM CHLORIDE 0.9% FLUSH
10.0000 mL | INTRAVENOUS | Status: DC | PRN
  Administered 2023-05-23: 10 mL

## 2023-05-23 NOTE — Patient Instructions (Signed)

## 2023-05-27 DIAGNOSIS — C182 Malignant neoplasm of ascending colon: Secondary | ICD-10-CM | POA: Diagnosis not present

## 2023-06-01 ENCOUNTER — Other Ambulatory Visit: Payer: Self-pay | Admitting: Oncology

## 2023-06-04 ENCOUNTER — Other Ambulatory Visit: Payer: Self-pay

## 2023-06-04 ENCOUNTER — Inpatient Hospital Stay: Payer: Medicare HMO

## 2023-06-04 ENCOUNTER — Encounter: Payer: Self-pay | Admitting: Nurse Practitioner

## 2023-06-04 ENCOUNTER — Inpatient Hospital Stay (HOSPITAL_BASED_OUTPATIENT_CLINIC_OR_DEPARTMENT_OTHER): Payer: Medicare HMO | Admitting: Nurse Practitioner

## 2023-06-04 ENCOUNTER — Inpatient Hospital Stay: Payer: Medicare HMO | Attending: Oncology

## 2023-06-04 VITALS — BP 126/67 | HR 91 | Temp 98.1°F | Resp 18 | Ht 66.0 in | Wt 246.5 lb

## 2023-06-04 DIAGNOSIS — Z5189 Encounter for other specified aftercare: Secondary | ICD-10-CM | POA: Insufficient documentation

## 2023-06-04 DIAGNOSIS — D509 Iron deficiency anemia, unspecified: Secondary | ICD-10-CM | POA: Insufficient documentation

## 2023-06-04 DIAGNOSIS — C787 Secondary malignant neoplasm of liver and intrahepatic bile duct: Secondary | ICD-10-CM

## 2023-06-04 DIAGNOSIS — C182 Malignant neoplasm of ascending colon: Secondary | ICD-10-CM | POA: Diagnosis not present

## 2023-06-04 DIAGNOSIS — Z5111 Encounter for antineoplastic chemotherapy: Secondary | ICD-10-CM | POA: Diagnosis present

## 2023-06-04 DIAGNOSIS — C189 Malignant neoplasm of colon, unspecified: Secondary | ICD-10-CM

## 2023-06-04 DIAGNOSIS — Z9049 Acquired absence of other specified parts of digestive tract: Secondary | ICD-10-CM | POA: Insufficient documentation

## 2023-06-04 LAB — CBC WITH DIFFERENTIAL (CANCER CENTER ONLY)
Abs Immature Granulocytes: 0.06 10*3/uL (ref 0.00–0.07)
Basophils Absolute: 0 10*3/uL (ref 0.0–0.1)
Basophils Relative: 1 %
Eosinophils Absolute: 0.1 10*3/uL (ref 0.0–0.5)
Eosinophils Relative: 2 %
HCT: 29 % — ABNORMAL LOW (ref 39.0–52.0)
Hemoglobin: 8.7 g/dL — ABNORMAL LOW (ref 13.0–17.0)
Immature Granulocytes: 1 %
Lymphocytes Relative: 17 %
Lymphs Abs: 0.8 10*3/uL (ref 0.7–4.0)
MCH: 25.1 pg — ABNORMAL LOW (ref 26.0–34.0)
MCHC: 30 g/dL (ref 30.0–36.0)
MCV: 83.8 fL (ref 80.0–100.0)
Monocytes Absolute: 0.4 10*3/uL (ref 0.1–1.0)
Monocytes Relative: 8 %
Neutro Abs: 3.5 10*3/uL (ref 1.7–7.7)
Neutrophils Relative %: 71 %
Platelet Count: 176 10*3/uL (ref 150–400)
RBC: 3.46 MIL/uL — ABNORMAL LOW (ref 4.22–5.81)
RDW: 19.8 % — ABNORMAL HIGH (ref 11.5–15.5)
WBC Count: 4.9 10*3/uL (ref 4.0–10.5)
nRBC: 0 % (ref 0.0–0.2)

## 2023-06-04 LAB — CMP (CANCER CENTER ONLY)
ALT: 31 U/L (ref 0–44)
AST: 27 U/L (ref 15–41)
Albumin: 3.8 g/dL (ref 3.5–5.0)
Alkaline Phosphatase: 202 U/L — ABNORMAL HIGH (ref 38–126)
Anion gap: 5 (ref 5–15)
BUN: 13 mg/dL (ref 6–20)
CO2: 28 mmol/L (ref 22–32)
Calcium: 8.9 mg/dL (ref 8.9–10.3)
Chloride: 102 mmol/L (ref 98–111)
Creatinine: 0.77 mg/dL (ref 0.61–1.24)
GFR, Estimated: >60 mL/min (ref >60)
Glucose, Bld: 237 mg/dL — ABNORMAL HIGH (ref 70–99)
Potassium: 4 mmol/L (ref 3.5–5.1)
Sodium: 135 mmol/L (ref 135–145)
Total Bilirubin: 0.8 mg/dL (ref 0.3–1.2)
Total Protein: 6.5 g/dL (ref 6.5–8.1)

## 2023-06-04 LAB — TOTAL PROTEIN, URINE DIPSTICK: Protein, ur: NEGATIVE mg/dL

## 2023-06-04 LAB — ABO/RH: ABO/RH(D): O POS

## 2023-06-04 LAB — CEA (ACCESS): CEA (CHCC): 9.75 ng/mL — ABNORMAL HIGH (ref 0.00–5.00)

## 2023-06-04 LAB — IRON AND TIBC
Iron: 40 ug/dL — ABNORMAL LOW (ref 45–182)
Saturation Ratios: 7 % — ABNORMAL LOW (ref 17.9–39.5)
TIBC: 543 ug/dL — ABNORMAL HIGH (ref 250–450)
UIBC: 503 ug/dL

## 2023-06-04 LAB — FERRITIN: Ferritin: 34 ng/mL (ref 24–336)

## 2023-06-04 MED ORDER — SODIUM CHLORIDE 0.9 % IV SOLN: Freq: Once | INTRAVENOUS | Status: AC

## 2023-06-04 MED ORDER — SODIUM CHLORIDE 0.9 % IV SOLN
150.0000 mg | Freq: Once | INTRAVENOUS | Status: AC
  Administered 2023-06-04: 150 mg via INTRAVENOUS
  Filled 2023-06-04: qty 150

## 2023-06-04 MED ORDER — SODIUM CHLORIDE 0.9 % IV SOLN
200.0000 mg/m2 | Freq: Once | INTRAVENOUS | Status: AC
  Administered 2023-06-04: 444 mg via INTRAVENOUS
  Filled 2023-06-04: qty 22.2

## 2023-06-04 MED ORDER — SODIUM CHLORIDE 0.9 % IV SOLN
180.0000 mg/m2 | Freq: Once | INTRAVENOUS | Status: AC
  Administered 2023-06-04: 400 mg via INTRAVENOUS
  Filled 2023-06-04: qty 15

## 2023-06-04 MED ORDER — FLUOROURACIL CHEMO INJECTION 500 MG/10ML
200.0000 mg/m2 | Freq: Once | INTRAVENOUS | Status: AC
  Administered 2023-06-04: 450 mg via INTRAVENOUS
  Filled 2023-06-04: qty 9

## 2023-06-04 MED ORDER — SODIUM CHLORIDE 0.9 % IV SOLN
10.0000 mg | Freq: Once | INTRAVENOUS | Status: AC
  Administered 2023-06-04: 10 mg via INTRAVENOUS
  Filled 2023-06-04: qty 10

## 2023-06-04 MED ORDER — SODIUM CHLORIDE 0.9 % IV SOLN
1600.0000 mg/m2 | INTRAVENOUS | Status: DC
  Administered 2023-06-04: 3500 mg via INTRAVENOUS
  Filled 2023-06-04: qty 20

## 2023-06-04 MED ORDER — ATROPINE SULFATE 1 MG/ML IV SOLN
0.5000 mg | Freq: Once | INTRAVENOUS | Status: AC | PRN
  Administered 2023-06-04: 0.5 mg via INTRAVENOUS
  Filled 2023-06-04: qty 1

## 2023-06-04 MED ORDER — PALONOSETRON HCL INJECTION 0.25 MG/5ML
0.2500 mg | Freq: Once | INTRAVENOUS | Status: AC
  Administered 2023-06-04: 0.25 mg via INTRAVENOUS
  Filled 2023-06-04: qty 5

## 2023-06-04 NOTE — Patient Instructions (Signed)

## 2023-06-04 NOTE — Addendum Note (Signed)
Addended by: Rana Snare on: 06/04/2023 04:20 PM   Modules accepted: Orders

## 2023-06-04 NOTE — Progress Notes (Addendum)
[facility] OFFICE PROGRESS NOTE   Diagnosis: Colon cancer  INTERVAL HISTORY:   Anthony Calhoun returns as scheduled.  He completed another cycle of FOLFIRI/Avastin 05/21/2023.  He notes improved control of nausea.  He felt well until yesterday when he noted recurrent nausea.  He feels weak.  No diarrhea.  No mouth sores.  He denies bleeding.  Objective:  Vital signs in last 24 hours:  Blood pressure 126/67, pulse 91, temperature 98.1 F (36.7 C), temperature source Temporal, resp. rate 18, height 5\' 6"  (1.676 m), weight 246 lb 8 oz (111.8 kg), SpO2 98%.    HEENT: No thrush or ulcers. Resp: Lungs clear bilaterally. Cardio: Regular rate and rhythm. GI: No hepatosplenomegaly. Vascular: No leg edema. Skin: Pale appearing. Port-A-Cath without erythema.  Lab Results:  Lab Results  Component Value Date   WBC 4.9 06/04/2023   HGB 8.7 (L) 06/04/2023   HCT 29.0 (L) 06/04/2023   MCV 83.8 06/04/2023   PLT 176 06/04/2023   NEUTROABS 3.5 06/04/2023    Imaging:  No results found.  Medications: I have reviewed the patient's current medications.  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 secondary to nausea (patient discontinued Xeloda on day 11 due to colonoscopy) Colonoscopy 07/09/2020-nodular mucosa at the colonic anastomosis, biopsied; patent end-to-side ileocolonic anastomosis characterized by healthy-appearing mucosa and visible sutures; nonbleeding internal hemorrhoids, biopsy with  benign ulcerated anastomotic mucosa with granulation tissue Cycle 4 adjuvant Xeloda 07/19/2020, 2000 mg every morning and 1500 mg every afternoon Cycle 5 adjuvant Xeloda 08/09/2020 Cycle 6 adjuvant Xeloda 08/31/2019 Cycle 7 adjuvant Xeloda 09/20/2020 Cycle 8 adjuvant Xeloda 10/11/2020 CTs 03/07/2021-no evidence of recurrent disease, hepatic steatosis, slight wall thickening at the junction of the descending and horizontal duodenum Mild elevation of CEA 2023 12/01/2021-Guardant Reveal-ct DNA detected PET 12/30/2021-hypermetabolic right liver lesion, hypermetabolic area in a loop of mid small bowel with an SUV of 12.5, review of PET images at GI tumor conference 01/11/2022-small bowel uptake felt to be a benign finding MRI liver 01/10/2022-solitary 1.2 cm right liver lesion between segments 7 and 8 compatible with metastasis, subtle changes suggesting cirrhosis, hepatic steatosis CT enteroscopy 02/02/2022-small bowel loop with hypermetabolic activity on PET continues to have a bandlike density along the margin felt to represent a diverticulum or adjacent nodal tissue.  Small bowel tumor not excluded.  3-4 mm left omental nodule 02/08/2022-biopsy and ablation of solitary liver lesion, adenocarcinoma consistent with metastatic colorectal adenocarcinoma CTs 03/31/2022-unchanged circumstantial soft tissue thickening surrounding a loop of mid to distal small bowel with an adjacent nodular focus measuring 1.2 cm.,  Ablation cavity in segment 7, no other evidence of metastatic disease Diagnostic laparoscopy 05/26/2022-mid small bowel mass-resected, well to moderately differentiated adenocarcinoma, 0/2 lymph nodes, morphology consistent with a colon primary, tumor involved full-thickness including serosa with perineural and large vessel involvement; foundation 1-microsatellite stable, tumor mutation burden 2, K-ras Q61H CT abdomen/pelvis 07/10/2022-the right liver ablation defect is smaller, new subtle right liver lesion  measuring 13 x 9 mm, enlarged lymph node in the right upper quadrant mesentery adjacent to surgical anastomosis PET 07/26/2022-new segment 6 liver  lesion, enlarging hypermetabolic left omental lesion, there is another mildly hypermetabolic peritoneal implant, 7 mm omental nodule at the midline without hypermetabolism Cycle 1 FOLFOX 09/25/2022 Cycle 2 FOLFOX/bevacizumab 10/09/2022, 5-FU dose reduced secondary to mucositis Cycle 3 FOLFOX/bevacizumab 10/23/2022 Cycle 4 FOLFOX/bevacizumab 11/06/2022 Cycle 5 FOLFOX/bevacizumab 11/20/2022 Cycle 6 FOLFOX/bevacizumab 12/04/2022 CT abdomen/pelvis 12/14/2022-further contraction of the treated segment 7 lesion, slight enlargement of segment 6 lesion, no new liver lesion, increased size of ileocolic nodes and an omental lesion, stable small retroperitoneal nodes Cycle 7 FOLFOX/bevacizumab 12/18/2022 Cycle 8 FOLFOX/bevacizumab 01/02/2023, oxaliplatin dose reduced secondary to prolonged nausea and neuropathy symptoms, Emend added Cycle 9 FOLFOX/bevacizumab 01/16/2023, oxaliplatin discontinued secondary to neuropathy Cycle 1 FOLFIRI 02/19/2023, Avastin held due to recent dental extractions Cycle 2 FOLFIRI/Avastin 03/05/2023 Cycle 3 FOLFIRI/Avastin 03/19/2023, 5-FU bolus held due to mucositis after cycle 2, Fulphila added Cycle 4 FOLFIRI/Avastin 04/02/2023, Fulphila, Emend Cycle 5 FOLFIRI/Avastin 04/16/2023, Fulphila, Emend CTs 05/02/2023-similar ileocolic mesenteric adenopathy and omental metastasis, cirrhosis with portal hypertension, stable liver lesions, no new or progressive disease Cycle 6 FOLFIRI/Avastin 05/07/2023, Fulphila, Emend Cycle 7 FOLFIRI/Avastin 05/21/2023, Fulphila, Emend Cycle 8 FOLFIRI 06/04/2023, Fulphila, Emend; Avastin held Microcytic anemia secondary to #1-improved Colon polyps-sessile serrated adenoma of the descending colon, tubular adenomas with focal high-grade dysplasia of the transverse colon on colonoscopy 02/12/2020, tubular adenoma of the mid ascending  colon on the surgical specimen 04/12/2020 Family history of colon cancer Depression Anxiety/panic attacks Hypertension Hyperlipidemia Nausea secondary to Xeloda?-Resolved Gastroesophageal reflux disease-improved following discontinuation of Xeloda Anemia, microcytic; ferritin 6 01/24/2022; stool positive for blood x3 01/25/2022; 02/02/2022 CT abdomen/pelvis enterography-bowel loop demonstrating hypermetabolic activity continues to have a bandlike density along its margin possibly representing a diverticulum or adjacent nodal tissue, difficult to exclude small bowel tumor, 3 x 4 mm left omental nodule or lymph node, known right hepatic lobe tumor is occult on CT. Venofer weekly x3 beginning 03/03/2022 Negative capsule endoscopy 03/09/2022 Venofer 03/03/2022 for 3 weekly doses Venofer 05/03/2022, 05/19/2022 Venofer 07/28/2022, 08/04/2022 12.  Anterior left neck nodule 03/17/2022-cyst?,  Lymph node? 13.  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  He has completed 7 cycles of FOLFIRI/Avastin.  Overall he is tolerating treatment well, noted improved nausea with the most recent cycle.  Plan to proceed with FOLFIRI today as scheduled.  Avastin will be held with today's treatment as we investigate the progressive anemia further.  CBC and chemistry panel reviewed.  Labs adequate to proceed with treatment as above.  He has progressive anemia and is now symptomatic.  Plan for an iron panel today.  He will complete stool cards.  We are making arrangements for a blood transfusion.  We reviewed the potential for an allergic reaction and infection transmission.  He agrees to  proceed.  He will return for follow-up and treatment as scheduled in 2 weeks.  He will contact the office in the interim with any problems.  We specifically discussed bleeding.    Lonna Cobb ANP/GNP-BC   06/04/2023  11:58 AM

## 2023-06-04 NOTE — Patient Instructions (Signed)
[facility]  The chemotherapy medication bag should finish at 46 hours, 96 hours, or 7 days. For example, if your pump is scheduled for 46 hours and it was put on at 4:00 p.m., it should finish at 2:00 p.m. the day it is scheduled to come off regardless of your appointment time.     Estimated time to finish at 1:45 Wednesday, May 23, 2023.   If the display on your pump reads "Low Volume" and it is beeping, take the batteries out of the pump and come to the cancer center for it to be taken off.   If the pump alarms go off prior to the pump reading "Low Volume" then call [phone] and someone can assist you.  If the plunger comes out and the chemotherapy medication is leaking out, please use your home chemo spill kit to clean up the spill. Do NOT use paper towels or other household products.  If you have problems or questions regarding your pump, please call either [phone] (24 hours a day) or the cancer center Monday-Friday 8:00 a.m.- 4:30 p.m. at the clinic number and we will assist you. If you are unable to get assistance, then go to the nearest Emergency Department and ask the staff to contact the IV team for assistance.   Discharge Instructions: Thank you for choosing [facility] to provide your oncology and hematology care.   If you have a lab appointment with the Cancer Center, please go directly to the Cancer Center and check in at the registration area.   Wear comfortable clothing and clothing appropriate for easy access to any Portacath or PICC line.   We strive to give you quality time with your provider. You may need to reschedule your appointment if you arrive late (15 or more minutes).  Arriving late affects you and other patients whose appointments are after yours.  Also, if you miss three or more appointments without notifying the office, you may be dismissed from the clinic at the provider's discretion.      For  prescription refill requests, have your pharmacy contact our office and allow 72 hours for refills to be completed.    Today you received the following chemotherapy and/or immunotherapy agents Irinotecan, Leucovorin, Flurouracil.      To help prevent nausea and vomiting after your treatment, we encourage you to take your nausea medication as directed.  BELOW ARE SYMPTOMS THAT SHOULD BE REPORTED IMMEDIATELY: *FEVER GREATER THAN 100.4 F (38 C) OR HIGHER *CHILLS OR SWEATING *NAUSEA AND VOMITING THAT IS NOT CONTROLLED WITH YOUR NAUSEA MEDICATION *UNUSUAL SHORTNESS OF BREATH *UNUSUAL BRUISING OR BLEEDING *URINARY PROBLEMS (pain or burning when urinating, or frequent urination) *BOWEL PROBLEMS (unusual diarrhea, constipation, pain near the anus) TENDERNESS IN MOUTH AND THROAT WITH OR WITHOUT PRESENCE OF ULCERS (sore throat, sores in mouth, or a toothache) UNUSUAL RASH, SWELLING OR PAIN  UNUSUAL VAGINAL DISCHARGE OR ITCHING   Items with * indicate a potential emergency and should be followed up as soon as possible or go to the Emergency Department if any problems should occur.  Please show the CHEMOTHERAPY ALERT CARD or IMMUNOTHERAPY ALERT CARD at check-in to the Emergency Department and triage nurse.  Should you have questions after your visit or need to cancel or reschedule your appointment, please contact [facility]  Dept: [phone]  and follow the prompts.  Office hours are 8:00 a.m. to 4:30 p.m. Monday - Friday. Please note  that voicemails left after 4:00 p.m. may not be returned until the following business day.  We are closed weekends and major holidays. You have access to a nurse at all times for urgent questions. Please call the main number to the clinic Dept: [phone] and follow the prompts.   For any non-urgent questions, you may also contact your provider using MyChart. We now offer e-Visits for anyone 43 and older to request care online for  non-urgent symptoms. For details visit [email or web address].   Also download the MyChart app! Go to the app store, search "MyChart", open the app, select [facility], and log in with your MyChart username and password. Irinotecan Injection What is this medication? IRINOTECAN (ir in oh TEE kan) treats some types of cancer. It works by slowing down the growth of cancer cells. This medicine may be used for other purposes; ask your health care provider or pharmacist if you have questions. COMMON BRAND NAME(S): Camptosar What should I tell my care team before I take this medication? They need to know if you have any of these conditions: Dehydration Diarrhea Infection, especially a viral infection, such as chickenpox, cold sores, herpes Liver disease Low blood cell levels (white cells, red cells, and platelets) Low levels of electrolytes, such as calcium, magnesium, or potassium in your blood Recent or ongoing radiation An unusual or allergic reaction to irinotecan, other medications, foods, dyes, or preservatives If you or your partner are pregnant or trying to get pregnant Breast-feeding How should I use this medication? This medication is injected into a vein. It is given by your care team in a hospital or clinic setting. Talk to your care team about the use of this medication in children. Special care may be needed. Overdosage: If you think you have taken too much of this medicine contact a poison control center or emergency room at once. NOTE: This medicine is only for you. Do not share this medicine with others. What if I miss a dose? Keep appointments for follow-up doses. It is important not to miss your dose. Call your care team if you are unable to keep an appointment. What may interact with this medication? Do not take this medication with any of the following: Cobicistat Itraconazole This medication may also interact with the following: Certain antibiotics, such as  clarithromycin, rifampin, rifabutin Certain antivirals for HIV or AIDS Certain medications for fungal infections, such as ketoconazole, posaconazole, voriconazole Certain medications for seizures, such as carbamazepine, phenobarbital, phenytoin Gemfibrozil Nefazodone St. John's wort This list may not describe all possible interactions. Give your health care provider a list of all the medicines, herbs, non-prescription drugs, or dietary supplements you use. Also tell them if you smoke, drink alcohol, or use illegal drugs. Some items may interact with your medicine. What should I watch for while using this medication? Your condition will be monitored carefully while you are receiving this medication. You may need blood work while taking this medication. This medication may make you feel generally unwell. This is not uncommon as chemotherapy can affect healthy cells as well as cancer cells. Report any side effects. Continue your course of treatment even though you feel ill unless your care team tells you to stop. This medication can cause serious side effects. To reduce the risk, your care team may give you other medications to take before receiving this one. Be sure to follow the directions from your care team. This medication may affect your coordination, reaction time, or judgement. Do not drive or operate  machinery until you know how this medication affects you. Sit up or stand slowly to reduce the risk of dizzy or fainting spells. Drinking alcohol with this medication can increase the risk of these side effects. This medication may increase your risk of getting an infection. Call your care team for advice if you get a fever, chills, sore throat, or other symptoms of a cold or flu. Do not treat yourself. Try to avoid being around people who are sick. Avoid taking medications that contain aspirin, acetaminophen, ibuprofen, naproxen, or ketoprofen unless instructed by your care team. These medications  may hide a fever. This medication may increase your risk to bruise or bleed. Call your care team if you notice any unusual bleeding. Be careful brushing or flossing your teeth or using a toothpick because you may get an infection or bleed more easily. If you have any dental work done, tell your dentist you are receiving this medication. Talk to your care team if you or your partner are pregnant or think either of you might be pregnant. This medication can cause serious birth defects if taken during pregnancy and for 6 months after the last dose. You will need a negative pregnancy test before starting this medication. Contraception is recommended while taking this medication and for 6 months after the last dose. Your care team can help you find the option that works for you. Do not father a child while taking this medication and for 3 months after the last dose. Use a condom for contraception during this time period. Do not breastfeed while taking this medication and for 7 days after the last dose. This medication may cause infertility. Talk to your care team if you are concerned about your fertility. What side effects may I notice from receiving this medication? Side effects that you should report to your care team as soon as possible: Allergic reactions--skin rash, itching, hives, swelling of the face, lips, tongue, or throat Dry cough, shortness of breath or trouble breathing Increased saliva or tears, increased sweating, stomach cramping, diarrhea, small pupils, unusual weakness or fatigue, slow heartbeat Infection--fever, chills, cough, sore throat, wounds that don't heal, pain or trouble when passing urine, general feeling of discomfort or being unwell Kidney injury--decrease in the amount of urine, swelling of the ankles, hands, or feet Low red blood cell level--unusual weakness or fatigue, dizziness, headache, trouble breathing Severe or prolonged diarrhea Unusual bruising or bleeding Side  effects that usually do not require medical attention (report to your care team if they continue or are bothersome): Constipation Diarrhea Hair loss Loss of appetite Nausea Stomach pain This list may not describe all possible side effects. Call your doctor for medical advice about side effects. You may report side effects to FDA at 1-800-FDA-1088. Where should I keep my medication? This medication is given in a hospital or clinic. It will not be stored at home. NOTE: This sheet is a summary. It may not cover all possible information. If you have questions about this medicine, talk to your doctor, pharmacist, or health care provider.  2024 Elsevier/Gold Standard (2021-12-26 00:00:00)  Leucovorin Injection What is this medication? LEUCOVORIN (loo koe VOR in) prevents side effects from certain medications, such as methotrexate. It works by increasing folate levels. This helps protect healthy cells in your body. It may also be used to treat anemia caused by low levels of folate. It can also be used with fluorouracil, a type of chemotherapy, to treat colorectal cancer. It works by increasing the effects  of fluorouracil in the body. This medicine may be used for other purposes; ask your health care provider or pharmacist if you have questions. What should I tell my care team before I take this medication? They need to know if you have any of these conditions: Anemia from low levels of vitamin B12 in the blood An unusual or allergic reaction to leucovorin, folic acid, other medications, foods, dyes, or preservatives Pregnant or trying to get pregnant Breastfeeding How should I use this medication? This medication is injected into a vein or a muscle. It is given by your care team in a hospital or clinic setting. Talk to your care team about the use of this medication in children. Special care may be needed. Overdosage: If you think you have taken too much of this medicine contact a poison control  center or emergency room at once. NOTE: This medicine is only for you. Do not share this medicine with others. What if I miss a dose? Keep appointments for follow-up doses. It is important not to miss your dose. Call your care team if you are unable to keep an appointment. What may interact with this medication? Capecitabine Fluorouracil Phenobarbital Phenytoin Primidone Trimethoprim;sulfamethoxazole This list may not describe all possible interactions. Give your health care provider a list of all the medicines, herbs, non-prescription drugs, or dietary supplements you use. Also tell them if you smoke, drink alcohol, or use illegal drugs. Some items may interact with your medicine. What should I watch for while using this medication? Your condition will be monitored carefully while you are receiving this medication. This medication may increase the side effects of 5-fluorouracil. Tell your care team if you have diarrhea or mouth sores that do not get better or that get worse. What side effects may I notice from receiving this medication? Side effects that you should report to your care team as soon as possible: Allergic reactions--skin rash, itching, hives, swelling of the face, lips, tongue, or throat This list may not describe all possible side effects. Call your doctor for medical advice about side effects. You may report side effects to FDA at 1-800-FDA-1088. Where should I keep my medication? This medication is given in a hospital or clinic. It will not be stored at home. NOTE: This sheet is a summary. It may not cover all possible information. If you have questions about this medicine, talk to your doctor, pharmacist, or health care provider.  2024 Elsevier/Gold Standard (2022-01-17 00:00:00)  Fluorouracil Injection What is this medication? FLUOROURACIL (flure oh YOOR a sil) treats some types of cancer. It works by slowing down the growth of cancer cells. This medicine may be used  for other purposes; ask your health care provider or pharmacist if you have questions. COMMON BRAND NAME(S): Adrucil What should I tell my care team before I take this medication? They need to know if you have any of these conditions: Blood disorders Dihydropyrimidine dehydrogenase (DPD) deficiency Infection, such as chickenpox, cold sores, herpes Kidney disease Liver disease Poor nutrition Recent or ongoing radiation therapy An unusual or allergic reaction to fluorouracil, other medications, foods, dyes, or preservatives If you or your partner are pregnant or trying to get pregnant Breast-feeding How should I use this medication? This medication is injected into a vein. It is administered by your care team in a hospital or clinic setting. Talk to your care team about the use of this medication in children. Special care may be needed. Overdosage: If you think you have taken too  much of this medicine contact a poison control center or emergency room at once. NOTE: This medicine is only for you. Do not share this medicine with others. What if I miss a dose? Keep appointments for follow-up doses. It is important not to miss your dose. Call your care team if you are unable to keep an appointment. What may interact with this medication? Do not take this medication with any of the following: Live virus vaccines This medication may also interact with the following: Medications that treat or prevent blood clots, such as warfarin, enoxaparin, dalteparin This list may not describe all possible interactions. Give your health care provider a list of all the medicines, herbs, non-prescription drugs, or dietary supplements you use. Also tell them if you smoke, drink alcohol, or use illegal drugs. Some items may interact with your medicine. What should I watch for while using this medication? Your condition will be monitored carefully while you are receiving this medication. This medication may make  you feel generally unwell. This is not uncommon as chemotherapy can affect healthy cells as well as cancer cells. Report any side effects. Continue your course of treatment even though you feel ill unless your care team tells you to stop. In some cases, you may be given additional medications to help with side effects. Follow all directions for their use. This medication may increase your risk of getting an infection. Call your care team for advice if you get a fever, chills, sore throat, or other symptoms of a cold or flu. Do not treat yourself. Try to avoid being around people who are sick. This medication may increase your risk to bruise or bleed. Call your care team if you notice any unusual bleeding. Be careful brushing or flossing your teeth or using a toothpick because you may get an infection or bleed more easily. If you have any dental work done, tell your dentist you are receiving this medication. Avoid taking medications that contain aspirin, acetaminophen, ibuprofen, naproxen, or ketoprofen unless instructed by your care team. These medications may hide a fever. Do not treat diarrhea with over the counter products. Contact your care team if you have diarrhea that lasts more than 2 days or if it is severe and watery. This medication can make you more sensitive to the sun. Keep out of the sun. If you cannot avoid being in the sun, wear protective clothing and sunscreen. Do not use sun lamps, tanning beds, or tanning booths. Talk to your care team if you or your partner wish to become pregnant or think you might be pregnant. This medication can cause serious birth defects if taken during pregnancy and for 3 months after the last dose. A reliable form of contraception is recommended while taking this medication and for 3 months after the last dose. Talk to your care team about effective forms of contraception. Do not father a child while taking this medication and for 3 months after the last dose.  Use a condom while having sex during this time period. Do not breastfeed while taking this medication. This medication may cause infertility. Talk to your care team if you are concerned about your fertility. What side effects may I notice from receiving this medication? Side effects that you should report to your care team as soon as possible: Allergic reactions--skin rash, itching, hives, swelling of the face, lips, tongue, or throat Heart attack--pain or tightness in the chest, shoulders, arms, or jaw, nausea, shortness of breath, cold or clammy skin, feeling  faint or lightheaded Heart failure--shortness of breath, swelling of the ankles, feet, or hands, sudden weight gain, unusual weakness or fatigue Heart rhythm changes--fast or irregular heartbeat, dizziness, feeling faint or lightheaded, chest pain, trouble breathing High ammonia level--unusual weakness or fatigue, confusion, loss of appetite, nausea, vomiting, seizures Infection--fever, chills, cough, sore throat, wounds that don't heal, pain or trouble when passing urine, general feeling of discomfort or being unwell Low red blood cell level--unusual weakness or fatigue, dizziness, headache, trouble breathing Pain, tingling, or numbness in the hands or feet, muscle weakness, change in vision, confusion or trouble speaking, loss of balance or coordination, trouble walking, seizures Redness, swelling, and blistering of the skin over hands and feet Severe or prolonged diarrhea Unusual bruising or bleeding Side effects that usually do not require medical attention (report to your care team if they continue or are bothersome): Dry skin Headache Increased tears Nausea Pain, redness, or swelling with sores inside the mouth or throat Sensitivity to light Vomiting This list may not describe all possible side effects. Call your doctor for medical advice about side effects. You may report side effects to FDA at 1-800-FDA-1088. Where should I  keep my medication? This medication is given in a hospital or clinic. It will not be stored at home. NOTE: This sheet is a summary. It may not cover all possible information. If you have questions about this medicine, talk to your doctor, pharmacist, or health care provider.  2024 Elsevier/Gold Standard (2021-12-20 00:00:00)

## 2023-06-04 NOTE — Progress Notes (Signed)
Patient seen by Lonna Cobb NP today  Vitals are within treatment parameters:Yes   Labs are within treatment parameters: Yes   Treatment plan has been signed: Yes   Per physician team, Patient is ready for treatment. Please note the following modifications: Holding the Avastin

## 2023-06-05 ENCOUNTER — Encounter: Payer: Self-pay | Admitting: Oncology

## 2023-06-05 ENCOUNTER — Telehealth: Payer: Self-pay | Admitting: Nurse Practitioner

## 2023-06-05 ENCOUNTER — Other Ambulatory Visit: Payer: Self-pay

## 2023-06-05 NOTE — Telephone Encounter (Signed)
I returned Anthony Calhoun's phone call.  We reviewed the results of the blood work done yesterday.  Labs appear consistent with iron deficiency.  He is completing stool cards.  He will follow-up as scheduled.

## 2023-06-06 ENCOUNTER — Inpatient Hospital Stay: Payer: Medicare HMO

## 2023-06-06 ENCOUNTER — Encounter: Payer: Self-pay | Admitting: Oncology

## 2023-06-06 ENCOUNTER — Other Ambulatory Visit: Payer: Self-pay | Admitting: *Deleted

## 2023-06-06 VITALS — BP 107/53 | HR 76 | Temp 98.1°F | Resp 18

## 2023-06-06 DIAGNOSIS — C189 Malignant neoplasm of colon, unspecified: Secondary | ICD-10-CM

## 2023-06-06 DIAGNOSIS — Z5111 Encounter for antineoplastic chemotherapy: Secondary | ICD-10-CM | POA: Diagnosis not present

## 2023-06-06 LAB — CBC WITH DIFFERENTIAL (CANCER CENTER ONLY)
Abs Immature Granulocytes: 0.01 10*3/uL (ref 0.00–0.07)
Basophils Absolute: 0 10*3/uL (ref 0.0–0.1)
Basophils Relative: 1 %
Eosinophils Absolute: 0 10*3/uL (ref 0.0–0.5)
Eosinophils Relative: 1 %
HCT: 27.8 % — ABNORMAL LOW (ref 39.0–52.0)
Hemoglobin: 8.2 g/dL — ABNORMAL LOW (ref 13.0–17.0)
Immature Granulocytes: 0 %
Lymphocytes Relative: 19 %
Lymphs Abs: 0.6 10*3/uL — ABNORMAL LOW (ref 0.7–4.0)
MCH: 24.8 pg — ABNORMAL LOW (ref 26.0–34.0)
MCHC: 29.5 g/dL — ABNORMAL LOW (ref 30.0–36.0)
MCV: 84 fL (ref 80.0–100.0)
Monocytes Absolute: 0.1 10*3/uL (ref 0.1–1.0)
Monocytes Relative: 4 %
Neutro Abs: 2.6 10*3/uL (ref 1.7–7.7)
Neutrophils Relative %: 75 %
Platelet Count: 184 10*3/uL (ref 150–400)
RBC: 3.31 MIL/uL — ABNORMAL LOW (ref 4.22–5.81)
RDW: 19.5 % — ABNORMAL HIGH (ref 11.5–15.5)
WBC Count: 3.5 10*3/uL — ABNORMAL LOW (ref 4.0–10.5)
nRBC: 0 % (ref 0.0–0.2)

## 2023-06-06 LAB — SAMPLE TO BLOOD BANK

## 2023-06-06 LAB — PREPARE RBC (CROSSMATCH)

## 2023-06-06 MED ORDER — SODIUM CHLORIDE 0.9% FLUSH
10.0000 mL | INTRAVENOUS | Status: DC | PRN
  Administered 2023-06-06: 10 mL

## 2023-06-06 MED ORDER — PEGFILGRASTIM-JMDB 6 MG/0.6ML ~~LOC~~ SOSY
6.0000 mg | PREFILLED_SYRINGE | Freq: Once | SUBCUTANEOUS | Status: AC
  Administered 2023-06-06: 6 mg via SUBCUTANEOUS
  Filled 2023-06-06: qty 0.6

## 2023-06-06 MED ORDER — HEPARIN SOD (PORK) LOCK FLUSH 100 UNIT/ML IV SOLN
500.0000 [IU] | Freq: Once | INTRAVENOUS | Status: AC | PRN
  Administered 2023-06-06: 500 [IU]

## 2023-06-06 NOTE — Patient Instructions (Signed)

## 2023-06-07 ENCOUNTER — Inpatient Hospital Stay: Payer: Medicare HMO

## 2023-06-07 ENCOUNTER — Other Ambulatory Visit (HOSPITAL_BASED_OUTPATIENT_CLINIC_OR_DEPARTMENT_OTHER): Payer: Self-pay

## 2023-06-07 ENCOUNTER — Encounter: Payer: Self-pay | Admitting: Oncology

## 2023-06-07 ENCOUNTER — Other Ambulatory Visit: Payer: Self-pay | Admitting: Nurse Practitioner

## 2023-06-07 DIAGNOSIS — C787 Secondary malignant neoplasm of liver and intrahepatic bile duct: Secondary | ICD-10-CM

## 2023-06-07 DIAGNOSIS — C189 Malignant neoplasm of colon, unspecified: Secondary | ICD-10-CM

## 2023-06-07 DIAGNOSIS — Z5111 Encounter for antineoplastic chemotherapy: Secondary | ICD-10-CM | POA: Diagnosis not present

## 2023-06-07 DIAGNOSIS — D509 Iron deficiency anemia, unspecified: Secondary | ICD-10-CM

## 2023-06-07 LAB — OCCULT BLOOD X 1 CARD TO LAB, STOOL
Fecal Occult Bld: POSITIVE — AB
Fecal Occult Bld: POSITIVE — AB
Fecal Occult Bld: POSITIVE — AB

## 2023-06-07 MED ORDER — SODIUM CHLORIDE 0.9% FLUSH
10.0000 mL | INTRAVENOUS | Status: AC | PRN
  Administered 2023-06-07: 10 mL

## 2023-06-07 MED ORDER — HEPARIN SOD (PORK) LOCK FLUSH 100 UNIT/ML IV SOLN
500.0000 [IU] | Freq: Every day | INTRAVENOUS | Status: AC | PRN
  Administered 2023-06-07: 500 [IU]

## 2023-06-07 MED ORDER — SODIUM CHLORIDE 0.9% IV SOLUTION
250.0000 mL | Freq: Once | INTRAVENOUS | Status: AC
  Administered 2023-06-07: 250 mL via INTRAVENOUS

## 2023-06-07 NOTE — Patient Instructions (Signed)

## 2023-06-08 ENCOUNTER — Encounter: Payer: Self-pay | Admitting: Oncology

## 2023-06-08 ENCOUNTER — Telehealth: Payer: Self-pay | Admitting: *Deleted

## 2023-06-08 LAB — BPAM RBC
Blood Product Expiration Date: 202411082359
Blood Product Expiration Date: 202411092359
ISSUE DATE / TIME: 202410100751
ISSUE DATE / TIME: 202410100751
Unit Type and Rh: 5100
Unit Type and Rh: 5100

## 2023-06-08 LAB — TYPE AND SCREEN
ABO/RH(D): O POS
Antibody Screen: NEGATIVE
Unit division: 0
Unit division: 0

## 2023-06-08 NOTE — Telephone Encounter (Signed)
LVM with [facility] that patient needs urgent return visit/referral to Dr. Marca Ancona for IDA with hemoccult + stools. Faxed recent records to office at [phone]

## 2023-06-09 ENCOUNTER — Telehealth: Payer: Self-pay

## 2023-06-09 NOTE — Telephone Encounter (Signed)
Patient called. Report he had pRBC on Thursday. He is not sure what to watch out for and wants to talk to on call physician.   Chart reviewed. Patient is on FOLFIRI/Avastin for CRC.  ROS found no short of breath, chest pain, dizziness, lightheadedness, abdominal pain, bloody stool. Report stool is dark today, stomach feels queasy . No other concerning symptoms. Discussed report to ED if any of above symptoms, or abdominal pain, distention, bloody stool or recurrently dark and black stool. He says he feels fine now and wants to wait for outpatient GI follow up. I let him know he may report to ED if having above symptoms and have emergent work up and evaluation in the ED. He understands.  Hi Darl Pikes or Misty Stanley. Would you be able to check on him on Monday and see he may need to be seen or getting blood drawn? Thank you.

## 2023-06-11 ENCOUNTER — Telehealth: Payer: Self-pay

## 2023-06-11 NOTE — Telephone Encounter (Signed)
-----   Message from Lonna Cobb sent at 06/11/2023 11:07 AM EDT ----- Please call and check on him.  He contacted the on-call doctor over the weekend with concerns regarding bleeding and the blood transfusion.

## 2023-06-11 NOTE — Telephone Encounter (Signed)
I contacted the patient, who expressed concern about the possibility of his blood levels dropping again. I reassured him that his levels should remain stable following two or three blood transfusions, unless he is experiencing active bleeding. The patient reached out to the on-call line over the weekend, hoping to speak with Dr. Truett Perna. I clarified to him that the on-call line is designated for emergency situations and should be used accordingly. Currently, the patient is not exhibiting any symptoms. Fannie Knee, RN have also sent a referral to [facility] and informed the patient that he can contact them directly to schedule an appointment.

## 2023-06-13 ENCOUNTER — Other Ambulatory Visit: Payer: Self-pay | Admitting: Gastroenterology

## 2023-06-13 DIAGNOSIS — Z85038 Personal history of other malignant neoplasm of large intestine: Secondary | ICD-10-CM | POA: Diagnosis not present

## 2023-06-13 DIAGNOSIS — D649 Anemia, unspecified: Secondary | ICD-10-CM | POA: Diagnosis not present

## 2023-06-13 DIAGNOSIS — K7460 Unspecified cirrhosis of liver: Secondary | ICD-10-CM | POA: Diagnosis not present

## 2023-06-14 ENCOUNTER — Encounter: Payer: Self-pay | Admitting: *Deleted

## 2023-06-14 NOTE — Progress Notes (Signed)
Faxed back clearance to [facility] for colonoscopy and upper endoscopy on 07/03/23. Per Dr. Truett Perna: OK to proceed from oncology standpoint. Will check CBC and fax results to [facility] on 07/02/23. Will delay 11/4 chemo to 07/04/23. Dr. Truett Perna plans to have lab/flush/OV on 11/4 as scheduled.

## 2023-06-15 ENCOUNTER — Encounter: Payer: Self-pay | Admitting: Oncology

## 2023-06-16 ENCOUNTER — Other Ambulatory Visit: Payer: Self-pay | Admitting: Oncology

## 2023-06-18 ENCOUNTER — Inpatient Hospital Stay: Payer: Medicare HMO

## 2023-06-18 ENCOUNTER — Encounter: Payer: Self-pay | Admitting: Oncology

## 2023-06-18 ENCOUNTER — Other Ambulatory Visit: Payer: Self-pay | Admitting: Hematology and Oncology

## 2023-06-18 ENCOUNTER — Inpatient Hospital Stay (HOSPITAL_BASED_OUTPATIENT_CLINIC_OR_DEPARTMENT_OTHER): Payer: Medicare HMO | Admitting: Oncology

## 2023-06-18 VITALS — BP 130/75 | HR 93 | Temp 98.2°F | Resp 18 | Ht 66.0 in | Wt 246.5 lb

## 2023-06-18 VITALS — BP 123/66 | HR 82

## 2023-06-18 DIAGNOSIS — C189 Malignant neoplasm of colon, unspecified: Secondary | ICD-10-CM

## 2023-06-18 DIAGNOSIS — Z5111 Encounter for antineoplastic chemotherapy: Secondary | ICD-10-CM | POA: Diagnosis not present

## 2023-06-18 DIAGNOSIS — C787 Secondary malignant neoplasm of liver and intrahepatic bile duct: Secondary | ICD-10-CM | POA: Diagnosis not present

## 2023-06-18 DIAGNOSIS — C182 Malignant neoplasm of ascending colon: Secondary | ICD-10-CM | POA: Diagnosis not present

## 2023-06-18 LAB — CMP (CANCER CENTER ONLY)
ALT: 34 U/L (ref 0–44)
AST: 30 U/L (ref 15–41)
Albumin: 3.9 g/dL (ref 3.5–5.0)
Alkaline Phosphatase: 189 U/L — ABNORMAL HIGH (ref 38–126)
Anion gap: 7 (ref 5–15)
BUN: 10 mg/dL (ref 6–20)
CO2: 27 mmol/L (ref 22–32)
Calcium: 8.9 mg/dL (ref 8.9–10.3)
Chloride: 103 mmol/L (ref 98–111)
Creatinine: 0.83 mg/dL (ref 0.61–1.24)
GFR, Estimated: >60 mL/min (ref >60)
Glucose, Bld: 177 mg/dL — ABNORMAL HIGH (ref 70–99)
Potassium: 3.6 mmol/L (ref 3.5–5.1)
Sodium: 137 mmol/L (ref 135–145)
Total Bilirubin: 0.9 mg/dL (ref 0.3–1.2)
Total Protein: 6.9 g/dL (ref 6.5–8.1)

## 2023-06-18 LAB — CBC WITH DIFFERENTIAL (CANCER CENTER ONLY)
Abs Immature Granulocytes: 0.1 10*3/uL — ABNORMAL HIGH (ref 0.00–0.07)
Basophils Absolute: 0.1 10*3/uL (ref 0.0–0.1)
Basophils Relative: 1 %
Eosinophils Absolute: 0.1 10*3/uL (ref 0.0–0.5)
Eosinophils Relative: 2 %
HCT: 30.4 % — ABNORMAL LOW (ref 39.0–52.0)
Hemoglobin: 9.5 g/dL — ABNORMAL LOW (ref 13.0–17.0)
Immature Granulocytes: 2 %
Lymphocytes Relative: 17 %
Lymphs Abs: 1 10*3/uL (ref 0.7–4.0)
MCH: 26.3 pg (ref 26.0–34.0)
MCHC: 31.3 g/dL (ref 30.0–36.0)
MCV: 84.2 fL (ref 80.0–100.0)
Monocytes Absolute: 0.4 10*3/uL (ref 0.1–1.0)
Monocytes Relative: 6 %
Neutro Abs: 4.2 10*3/uL (ref 1.7–7.7)
Neutrophils Relative %: 72 %
Platelet Count: 139 10*3/uL — ABNORMAL LOW (ref 150–400)
RBC: 3.61 MIL/uL — ABNORMAL LOW (ref 4.22–5.81)
RDW: 19.1 % — ABNORMAL HIGH (ref 11.5–15.5)
WBC Count: 5.8 10*3/uL (ref 4.0–10.5)
nRBC: 0 % (ref 0.0–0.2)

## 2023-06-18 LAB — CEA (ACCESS): CEA (CHCC): 8.29 ng/mL — ABNORMAL HIGH (ref 0.00–5.00)

## 2023-06-18 LAB — TOTAL PROTEIN, URINE DIPSTICK: Protein, ur: NEGATIVE mg/dL

## 2023-06-18 MED ORDER — SODIUM CHLORIDE 0.9 % IV SOLN
150.0000 mg | Freq: Once | INTRAVENOUS | Status: AC
Start: 2023-06-18 — End: 2023-06-18
  Administered 2023-06-18: 150 mg via INTRAVENOUS
  Filled 2023-06-18: qty 150

## 2023-06-18 MED ORDER — PALONOSETRON HCL INJECTION 0.25 MG/5ML
0.2500 mg | Freq: Once | INTRAVENOUS | Status: AC
  Administered 2023-06-18: 0.25 mg via INTRAVENOUS
  Filled 2023-06-18: qty 5

## 2023-06-18 MED ORDER — SODIUM CHLORIDE 0.9 % IV SOLN
1600.0000 mg/m2 | INTRAVENOUS | Status: DC
  Administered 2023-06-18: 3500 mg via INTRAVENOUS
  Filled 2023-06-18: qty 70

## 2023-06-18 MED ORDER — SODIUM CHLORIDE 0.9 % IV SOLN
200.0000 mg/m2 | Freq: Once | INTRAVENOUS | Status: AC
  Administered 2023-06-18: 444 mg via INTRAVENOUS
  Filled 2023-06-18: qty 22.2

## 2023-06-18 MED ORDER — ATROPINE SULFATE 1 MG/ML IV SOLN
0.5000 mg | Freq: Once | INTRAVENOUS | Status: AC | PRN
  Administered 2023-06-18: 0.5 mg via INTRAVENOUS
  Filled 2023-06-18: qty 1

## 2023-06-18 MED ORDER — SODIUM CHLORIDE 0.9 % IV SOLN: Freq: Once | INTRAVENOUS | Status: AC

## 2023-06-18 MED ORDER — FLUOROURACIL CHEMO INJECTION 500 MG/10ML
200.0000 mg/m2 | Freq: Once | INTRAVENOUS | Status: AC
Start: 2023-06-18 — End: 2023-06-18
  Administered 2023-06-18: 450 mg via INTRAVENOUS
  Filled 2023-06-18: qty 9

## 2023-06-18 MED ORDER — SODIUM CHLORIDE 0.9 % IV SOLN
180.0000 mg/m2 | Freq: Once | INTRAVENOUS | Status: AC
  Administered 2023-06-18: 400 mg via INTRAVENOUS
  Filled 2023-06-18: qty 15

## 2023-06-18 MED ORDER — DEXAMETHASONE SODIUM PHOSPHATE 10 MG/ML IJ SOLN
10.0000 mg | Freq: Once | INTRAMUSCULAR | Status: AC
  Administered 2023-06-18: 10 mg via INTRAVENOUS
  Filled 2023-06-18: qty 1

## 2023-06-18 NOTE — Patient Instructions (Addendum)
[facility]   The chemotherapy medication bag should finish at 46 hours, 96 hours, or 7 days. For example, if your pump is scheduled for 46 hours and it was put on at 4:00 p.m., it should finish at 2:00 p.m. the day it is scheduled to come off regardless of your appointment time.     Estimated time to finish at 12:00 Wednesday, June 20, 2023.   If the display on your pump reads "Low Volume" and it is beeping, take the batteries out of the pump and come to the cancer center for it to be taken off.   If the pump alarms go off prior to the pump reading "Low Volume" then call [phone] and someone can assist you.  If the plunger comes out and the chemotherapy medication is leaking out, please use your home chemo spill kit to clean up the spill. Do NOT use paper towels or other household products.  If you have problems or questions regarding your pump, please call either [phone] (24 hours a day) or the cancer center Monday-Friday 8:00 a.m.- 4:30 p.m. at the clinic number and we will assist you. If you are unable to get assistance, then go to the nearest Emergency Department and ask the staff to contact the IV team for assistance.  Marland KitchenDischarge Instructions: Thank you for choosing [facility] to provide your oncology and hematology care.   If you have a lab appointment with the Cancer Center, please go directly to the Cancer Center and check in at the registration area.   Wear comfortable clothing and clothing appropriate for easy access to any Portacath or PICC line.   We strive to give you quality time with your provider. You may need to reschedule your appointment if you arrive late (15 or more minutes).  Arriving late affects you and other patients whose appointments are after yours.  Also, if you miss three or more appointments without notifying the office, you may be dismissed from the clinic at the provider's discretion.      For  prescription refill requests, have your pharmacy contact our office and allow 72 hours for refills to be completed.    Today you received the following chemotherapy and/or immunotherapy agents Irinotecan, Leucovorin, Fluorouracil.      To help prevent nausea and vomiting after your treatment, we encourage you to take your nausea medication as directed.  BELOW ARE SYMPTOMS THAT SHOULD BE REPORTED IMMEDIATELY: *FEVER GREATER THAN 100.4 F (38 C) OR HIGHER *CHILLS OR SWEATING *NAUSEA AND VOMITING THAT IS NOT CONTROLLED WITH YOUR NAUSEA MEDICATION *UNUSUAL SHORTNESS OF BREATH *UNUSUAL BRUISING OR BLEEDING *URINARY PROBLEMS (pain or burning when urinating, or frequent urination) *BOWEL PROBLEMS (unusual diarrhea, constipation, pain near the anus) TENDERNESS IN MOUTH AND THROAT WITH OR WITHOUT PRESENCE OF ULCERS (sore throat, sores in mouth, or a toothache) UNUSUAL RASH, SWELLING OR PAIN  UNUSUAL VAGINAL DISCHARGE OR ITCHING   Items with * indicate a potential emergency and should be followed up as soon as possible or go to the Emergency Department if any problems should occur.  Please show the CHEMOTHERAPY ALERT CARD or IMMUNOTHERAPY ALERT CARD at check-in to the Emergency Department and triage nurse.  Should you have questions after your visit or need to cancel or reschedule your appointment, please contact [facility]  Dept: [phone]  and follow the prompts.  Office hours are 8:00 a.m. to 4:30 p.m. Monday - Friday. Please note  that voicemails left after 4:00 p.m. may not be returned until the following business day.  We are closed weekends and major holidays. You have access to a nurse at all times for urgent questions. Please call the main number to the clinic Dept: [phone] and follow the prompts.   For any non-urgent questions, you may also contact your provider using MyChart. We now offer e-Visits for anyone 63 and older to request care online for  non-urgent symptoms. For details visit [email or web address].   Also download the MyChart app! Go to the app store, search "MyChart", open the app, select [facility], and log in with your MyChart username and password.  Irinotecan Injection What is this medication? IRINOTECAN (ir in oh TEE kan) treats some types of cancer. It works by slowing down the growth of cancer cells. This medicine may be used for other purposes; ask your health care provider or pharmacist if you have questions. COMMON BRAND NAME(S): Camptosar What should I tell my care team before I take this medication? They need to know if you have any of these conditions: Dehydration Diarrhea Infection, especially a viral infection, such as chickenpox, cold sores, herpes Liver disease Low blood cell levels (white cells, red cells, and platelets) Low levels of electrolytes, such as calcium, magnesium, or potassium in your blood Recent or ongoing radiation An unusual or allergic reaction to irinotecan, other medications, foods, dyes, or preservatives If you or your partner are pregnant or trying to get pregnant Breast-feeding How should I use this medication? This medication is injected into a vein. It is given by your care team in a hospital or clinic setting. Talk to your care team about the use of this medication in children. Special care may be needed. Overdosage: If you think you have taken too much of this medicine contact a poison control center or emergency room at once. NOTE: This medicine is only for you. Do not share this medicine with others. What if I miss a dose? Keep appointments for follow-up doses. It is important not to miss your dose. Call your care team if you are unable to keep an appointment. What may interact with this medication? Do not take this medication with any of the following: Cobicistat Itraconazole This medication may also interact with the following: Certain antibiotics, such as  clarithromycin, rifampin, rifabutin Certain antivirals for HIV or AIDS Certain medications for fungal infections, such as ketoconazole, posaconazole, voriconazole Certain medications for seizures, such as carbamazepine, phenobarbital, phenytoin Gemfibrozil Nefazodone St. John's wort This list may not describe all possible interactions. Give your health care provider a list of all the medicines, herbs, non-prescription drugs, or dietary supplements you use. Also tell them if you smoke, drink alcohol, or use illegal drugs. Some items may interact with your medicine. What should I watch for while using this medication? Your condition will be monitored carefully while you are receiving this medication. You may need blood work while taking this medication. This medication may make you feel generally unwell. This is not uncommon as chemotherapy can affect healthy cells as well as cancer cells. Report any side effects. Continue your course of treatment even though you feel ill unless your care team tells you to stop. This medication can cause serious side effects. To reduce the risk, your care team may give you other medications to take before receiving this one. Be sure to follow the directions from your care team. This medication may affect your coordination, reaction time, or judgement. Do not drive or  operate machinery until you know how this medication affects you. Sit up or stand slowly to reduce the risk of dizzy or fainting spells. Drinking alcohol with this medication can increase the risk of these side effects. This medication may increase your risk of getting an infection. Call your care team for advice if you get a fever, chills, sore throat, or other symptoms of a cold or flu. Do not treat yourself. Try to avoid being around people who are sick. Avoid taking medications that contain aspirin, acetaminophen, ibuprofen, naproxen, or ketoprofen unless instructed by your care team. These medications  may hide a fever. This medication may increase your risk to bruise or bleed. Call your care team if you notice any unusual bleeding. Be careful brushing or flossing your teeth or using a toothpick because you may get an infection or bleed more easily. If you have any dental work done, tell your dentist you are receiving this medication. Talk to your care team if you or your partner are pregnant or think either of you might be pregnant. This medication can cause serious birth defects if taken during pregnancy and for 6 months after the last dose. You will need a negative pregnancy test before starting this medication. Contraception is recommended while taking this medication and for 6 months after the last dose. Your care team can help you find the option that works for you. Do not father a child while taking this medication and for 3 months after the last dose. Use a condom for contraception during this time period. Do not breastfeed while taking this medication and for 7 days after the last dose. This medication may cause infertility. Talk to your care team if you are concerned about your fertility. What side effects may I notice from receiving this medication? Side effects that you should report to your care team as soon as possible: Allergic reactions--skin rash, itching, hives, swelling of the face, lips, tongue, or throat Dry cough, shortness of breath or trouble breathing Increased saliva or tears, increased sweating, stomach cramping, diarrhea, small pupils, unusual weakness or fatigue, slow heartbeat Infection--fever, chills, cough, sore throat, wounds that don't heal, pain or trouble when passing urine, general feeling of discomfort or being unwell Kidney injury--decrease in the amount of urine, swelling of the ankles, hands, or feet Low red blood cell level--unusual weakness or fatigue, dizziness, headache, trouble breathing Severe or prolonged diarrhea Unusual bruising or bleeding Side  effects that usually do not require medical attention (report to your care team if they continue or are bothersome): Constipation Diarrhea Hair loss Loss of appetite Nausea Stomach pain This list may not describe all possible side effects. Call your doctor for medical advice about side effects. You may report side effects to FDA at 1-800-FDA-1088. Where should I keep my medication? This medication is given in a hospital or clinic. It will not be stored at home. NOTE: This sheet is a summary. It may not cover all possible information. If you have questions about this medicine, talk to your doctor, pharmacist, or health care provider.  2024 Elsevier/Gold Standard (2021-12-26 00:00:00)  Leucovorin Injection What is this medication? LEUCOVORIN (loo koe VOR in) prevents side effects from certain medications, such as methotrexate. It works by increasing folate levels. This helps protect healthy cells in your body. It may also be used to treat anemia caused by low levels of folate. It can also be used with fluorouracil, a type of chemotherapy, to treat colorectal cancer. It works by increasing the  effects of fluorouracil in the body. This medicine may be used for other purposes; ask your health care provider or pharmacist if you have questions. What should I tell my care team before I take this medication? They need to know if you have any of these conditions: Anemia from low levels of vitamin B12 in the blood An unusual or allergic reaction to leucovorin, folic acid, other medications, foods, dyes, or preservatives Pregnant or trying to get pregnant Breastfeeding How should I use this medication? This medication is injected into a vein or a muscle. It is given by your care team in a hospital or clinic setting. Talk to your care team about the use of this medication in children. Special care may be needed. Overdosage: If you think you have taken too much of this medicine contact a poison control  center or emergency room at once. NOTE: This medicine is only for you. Do not share this medicine with others. What if I miss a dose? Keep appointments for follow-up doses. It is important not to miss your dose. Call your care team if you are unable to keep an appointment. What may interact with this medication? Capecitabine Fluorouracil Phenobarbital Phenytoin Primidone Trimethoprim;sulfamethoxazole This list may not describe all possible interactions. Give your health care provider a list of all the medicines, herbs, non-prescription drugs, or dietary supplements you use. Also tell them if you smoke, drink alcohol, or use illegal drugs. Some items may interact with your medicine. What should I watch for while using this medication? Your condition will be monitored carefully while you are receiving this medication. This medication may increase the side effects of 5-fluorouracil. Tell your care team if you have diarrhea or mouth sores that do not get better or that get worse. What side effects may I notice from receiving this medication? Side effects that you should report to your care team as soon as possible: Allergic reactions--skin rash, itching, hives, swelling of the face, lips, tongue, or throat This list may not describe all possible side effects. Call your doctor for medical advice about side effects. You may report side effects to FDA at 1-800-FDA-1088. Where should I keep my medication? This medication is given in a hospital or clinic. It will not be stored at home. NOTE: This sheet is a summary. It may not cover all possible information. If you have questions about this medicine, talk to your doctor, pharmacist, or health care provider.  2024 Elsevier/Gold Standard (2022-01-17 00:00:00)  Fluorouracil Injection What is this medication? FLUOROURACIL (flure oh YOOR a sil) treats some types of cancer. It works by slowing down the growth of cancer cells. This medicine may be used  for other purposes; ask your health care provider or pharmacist if you have questions. COMMON BRAND NAME(S): Adrucil What should I tell my care team before I take this medication? They need to know if you have any of these conditions: Blood disorders Dihydropyrimidine dehydrogenase (DPD) deficiency Infection, such as chickenpox, cold sores, herpes Kidney disease Liver disease Poor nutrition Recent or ongoing radiation therapy An unusual or allergic reaction to fluorouracil, other medications, foods, dyes, or preservatives If you or your partner are pregnant or trying to get pregnant Breast-feeding How should I use this medication? This medication is injected into a vein. It is administered by your care team in a hospital or clinic setting. Talk to your care team about the use of this medication in children. Special care may be needed. Overdosage: If you think you have taken  too much of this medicine contact a poison control center or emergency room at once. NOTE: This medicine is only for you. Do not share this medicine with others. What if I miss a dose? Keep appointments for follow-up doses. It is important not to miss your dose. Call your care team if you are unable to keep an appointment. What may interact with this medication? Do not take this medication with any of the following: Live virus vaccines This medication may also interact with the following: Medications that treat or prevent blood clots, such as warfarin, enoxaparin, dalteparin This list may not describe all possible interactions. Give your health care provider a list of all the medicines, herbs, non-prescription drugs, or dietary supplements you use. Also tell them if you smoke, drink alcohol, or use illegal drugs. Some items may interact with your medicine. What should I watch for while using this medication? Your condition will be monitored carefully while you are receiving this medication. This medication may make  you feel generally unwell. This is not uncommon as chemotherapy can affect healthy cells as well as cancer cells. Report any side effects. Continue your course of treatment even though you feel ill unless your care team tells you to stop. In some cases, you may be given additional medications to help with side effects. Follow all directions for their use. This medication may increase your risk of getting an infection. Call your care team for advice if you get a fever, chills, sore throat, or other symptoms of a cold or flu. Do not treat yourself. Try to avoid being around people who are sick. This medication may increase your risk to bruise or bleed. Call your care team if you notice any unusual bleeding. Be careful brushing or flossing your teeth or using a toothpick because you may get an infection or bleed more easily. If you have any dental work done, tell your dentist you are receiving this medication. Avoid taking medications that contain aspirin, acetaminophen, ibuprofen, naproxen, or ketoprofen unless instructed by your care team. These medications may hide a fever. Do not treat diarrhea with over the counter products. Contact your care team if you have diarrhea that lasts more than 2 days or if it is severe and watery. This medication can make you more sensitive to the sun. Keep out of the sun. If you cannot avoid being in the sun, wear protective clothing and sunscreen. Do not use sun lamps, tanning beds, or tanning booths. Talk to your care team if you or your partner wish to become pregnant or think you might be pregnant. This medication can cause serious birth defects if taken during pregnancy and for 3 months after the last dose. A reliable form of contraception is recommended while taking this medication and for 3 months after the last dose. Talk to your care team about effective forms of contraception. Do not father a child while taking this medication and for 3 months after the last dose.  Use a condom while having sex during this time period. Do not breastfeed while taking this medication. This medication may cause infertility. Talk to your care team if you are concerned about your fertility. What side effects may I notice from receiving this medication? Side effects that you should report to your care team as soon as possible: Allergic reactions--skin rash, itching, hives, swelling of the face, lips, tongue, or throat Heart attack--pain or tightness in the chest, shoulders, arms, or jaw, nausea, shortness of breath, cold or clammy skin,  feeling faint or lightheaded Heart failure--shortness of breath, swelling of the ankles, feet, or hands, sudden weight gain, unusual weakness or fatigue Heart rhythm changes--fast or irregular heartbeat, dizziness, feeling faint or lightheaded, chest pain, trouble breathing High ammonia level--unusual weakness or fatigue, confusion, loss of appetite, nausea, vomiting, seizures Infection--fever, chills, cough, sore throat, wounds that don't heal, pain or trouble when passing urine, general feeling of discomfort or being unwell Low red blood cell level--unusual weakness or fatigue, dizziness, headache, trouble breathing Pain, tingling, or numbness in the hands or feet, muscle weakness, change in vision, confusion or trouble speaking, loss of balance or coordination, trouble walking, seizures Redness, swelling, and blistering of the skin over hands and feet Severe or prolonged diarrhea Unusual bruising or bleeding Side effects that usually do not require medical attention (report to your care team if they continue or are bothersome): Dry skin Headache Increased tears Nausea Pain, redness, or swelling with sores inside the mouth or throat Sensitivity to light Vomiting This list may not describe all possible side effects. Call your doctor for medical advice about side effects. You may report side effects to FDA at 1-800-FDA-1088. Where should I  keep my medication? This medication is given in a hospital or clinic. It will not be stored at home. NOTE: This sheet is a summary. It may not cover all possible information. If you have questions about this medicine, talk to your doctor, pharmacist, or health care provider.  2024 Elsevier/Gold Standard (2021-12-20 00:00:00)

## 2023-06-18 NOTE — Progress Notes (Signed)
Patient seen by Dr. Thornton Papas today  Vitals are within treatment parameters:Yes   Labs are within treatment parameters: Yes   Treatment plan has been signed: No   Per physician team, Patient is ready for treatment. Please note the following modifications: Holding Avastin

## 2023-06-18 NOTE — Progress Notes (Signed)
[facility] OFFICE PROGRESS NOTE   Diagnosis: Colon cancer  INTERVAL HISTORY:   Mr. Merenda completed another cycle FOLFIRI 06/04/2023.  He reports mild nausea following chemotherapy.  No diarrhea.  He reports black stools.  He received 2 units of packed red blood cells 06/07/2023 after he was diagnosed with recurrent iron deficiency anemia.  He is not taking iron.  He is scheduled for an endoscopic evaluation by Dr. Marca Ancona on 07/03/2023.  Objective:  Vital signs in last 24 hours:  Blood pressure 130/75, pulse 93, temperature 98.2 F (36.8 C), temperature source Temporal, resp. rate 18, height 5\' 6"  (1.676 m), weight 246 lb 8 oz (111.8 kg), SpO2 97%.    HEENT: No thrush or ulcers Resp: Lungs clear bilaterally Cardio: Regular rate and rhythm GI: No hepatosplenomegaly, no mass, nontender Vascular: No leg edema    Portacath/PICC-without erythema  Lab Results:  Lab Results  Component Value Date   WBC 5.8 06/18/2023   HGB 9.5 (L) 06/18/2023   HCT 30.4 (L) 06/18/2023   MCV 84.2 06/18/2023   PLT 139 (L) 06/18/2023   NEUTROABS 4.2 06/18/2023    CMP  Lab Results  Component Value Date   NA 135 06/04/2023   K 4.0 06/04/2023   CL 102 06/04/2023   CO2 28 06/04/2023   GLUCOSE 237 (H) 06/04/2023   BUN 13 06/04/2023   CREATININE 0.77 06/04/2023   CALCIUM 8.9 06/04/2023   PROT 6.5 06/04/2023   ALBUMIN 3.8 06/04/2023   AST 27 06/04/2023   ALT 31 06/04/2023   ALKPHOS 202 (H) 06/04/2023   BILITOT 0.8 06/04/2023   GFRNONAA >60 06/04/2023   GFRAA >60 05/10/2020    Lab Results  Component Value Date   CEA1 1.08 03/07/2021   CEA 9.75 (H) 06/04/2023     Medications: I have reviewed the patient's current medications.   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 secondary to nausea (patient discontinued Xeloda on day 11 due to colonoscopy) Colonoscopy 07/09/2020-nodular mucosa at the colonic anastomosis, biopsied; patent end-to-side ileocolonic anastomosis characterized by healthy-appearing mucosa and visible sutures; nonbleeding internal hemorrhoids, biopsy with benign ulcerated anastomotic mucosa with granulation tissue Cycle 4 adjuvant Xeloda 07/19/2020, 2000 mg every morning and 1500 mg every afternoon Cycle 5 adjuvant Xeloda 08/09/2020 Cycle 6 adjuvant Xeloda 08/31/2019 Cycle 7 adjuvant Xeloda 09/20/2020 Cycle 8 adjuvant Xeloda 10/11/2020 CTs 03/07/2021-no evidence of recurrent disease, hepatic steatosis, slight wall thickening at the junction of the descending and horizontal duodenum Mild elevation of CEA 2023 12/01/2021-Guardant Reveal-ct DNA detected PET 12/30/2021-hypermetabolic right liver lesion, hypermetabolic area in a loop of mid small bowel with an SUV of 12.5, review of PET images at GI tumor conference 01/11/2022-small bowel uptake felt to be a benign finding MRI liver 01/10/2022-solitary 1.2 cm right liver lesion between segments 7 and 8 compatible with metastasis, subtle changes suggesting cirrhosis, hepatic steatosis CT enteroscopy 02/02/2022-small bowel loop with hypermetabolic activity on PET continues to have a bandlike density along the margin felt to represent a diverticulum or adjacent nodal tissue.  Small bowel tumor not excluded.  3-4 mm left omental  nodule 02/08/2022-biopsy and ablation of solitary liver lesion, adenocarcinoma consistent with metastatic colorectal adenocarcinoma CTs 03/31/2022-unchanged circumstantial soft tissue thickening surrounding a  loop of mid to distal small bowel with an adjacent nodular focus measuring 1.2 cm.,  Ablation cavity in segment 7, no other evidence of metastatic disease Diagnostic laparoscopy 05/26/2022-mid small bowel mass-resected, well to moderately differentiated adenocarcinoma, 0/2 lymph nodes, morphology consistent with a colon primary, tumor involved full-thickness including serosa with perineural and large vessel involvement; foundation 1-microsatellite stable, tumor mutation burden 2, K-ras Q61H CT abdomen/pelvis 07/10/2022-the right liver ablation defect is smaller, new subtle right liver lesion measuring 13 x 9 mm, enlarged lymph node in the right upper quadrant mesentery adjacent to surgical anastomosis PET 07/26/2022-new segment 6 liver lesion, enlarging hypermetabolic left omental lesion, there is another mildly hypermetabolic peritoneal implant, 7 mm omental nodule at the midline without hypermetabolism Cycle 1 FOLFOX 09/25/2022 Cycle 2 FOLFOX/bevacizumab 10/09/2022, 5-FU dose reduced secondary to mucositis Cycle 3 FOLFOX/bevacizumab 10/23/2022 Cycle 4 FOLFOX/bevacizumab 11/06/2022 Cycle 5 FOLFOX/bevacizumab 11/20/2022 Cycle 6 FOLFOX/bevacizumab 12/04/2022 CT abdomen/pelvis 12/14/2022-further contraction of the treated segment 7 lesion, slight enlargement of segment 6 lesion, no new liver lesion, increased size of ileocolic nodes and an omental lesion, stable small retroperitoneal nodes Cycle 7 FOLFOX/bevacizumab 12/18/2022 Cycle 8 FOLFOX/bevacizumab 01/02/2023, oxaliplatin dose reduced secondary to prolonged nausea and neuropathy symptoms, Emend added Cycle 9 FOLFOX/bevacizumab 01/16/2023, oxaliplatin discontinued secondary to neuropathy Cycle 1 FOLFIRI 02/19/2023, Avastin held due to recent dental extractions Cycle 2 FOLFIRI/Avastin 03/05/2023 Cycle 3 FOLFIRI/Avastin 03/19/2023, 5-FU bolus held due to mucositis after cycle 2, Fulphila added Cycle 4 FOLFIRI/Avastin 04/02/2023, Fulphila, Emend Cycle 5  FOLFIRI/Avastin 04/16/2023, Fulphila, Emend CTs 05/02/2023-similar ileocolic mesenteric adenopathy and omental metastasis, cirrhosis with portal hypertension, stable liver lesions, no new or progressive disease Cycle 6 FOLFIRI/Avastin 05/07/2023, Fulphila, Emend Cycle 7 FOLFIRI/Avastin 05/21/2023, Fulphila, Emend Cycle 8 FOLFIRI 06/04/2023, Fulphila, Emend; Avastin held Cycle 9 FOLFIRI 06/18/2023, Fulphila, Emend, Avastin held Microcytic anemia secondary to #1-improved Colon polyps-sessile serrated adenoma of the descending colon, tubular adenomas with focal high-grade dysplasia of the transverse colon on colonoscopy 02/12/2020, tubular adenoma of the mid ascending colon on the surgical specimen 04/12/2020 Family history of colon cancer Depression Anxiety/panic attacks Hypertension Hyperlipidemia Nausea secondary to Xeloda?-Resolved Gastroesophageal reflux disease-improved following discontinuation of Xeloda Anemia, microcytic; ferritin 6 01/24/2022; stool positive for blood x3 01/25/2022; 02/02/2022 CT abdomen/pelvis enterography-bowel loop demonstrating hypermetabolic activity continues to have a bandlike density along its margin possibly representing a diverticulum or adjacent nodal tissue, difficult to exclude small bowel tumor, 3 x 4 mm left omental nodule or lymph node, known right hepatic lobe tumor is occult on CT. Venofer weekly x3 beginning 03/03/2022 Negative capsule endoscopy 03/09/2022 Venofer 03/03/2022 for 3 weekly doses Venofer 05/03/2022, 05/19/2022 Venofer 07/28/2022, 08/04/2022 Venofer 03/22/2023, 03/29/2023, 04/04/2023 12.  Anterior left neck nodule 03/17/2022-cyst?,  Lymph node? 13.  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  Oxaliplatin neuropathy-mild loss of vibratory sense  12/04/2022, 12/18/2022, 01/02/2023    Disposition: [name] has metastatic colon cancer.  He is tolerating the FOLFIRI well.  The CEA is lower again today.  He will complete another cycle of FOLFIRI today.  The neck cycle of chemotherapy will be delayed until he undergoes an endoscopic evaluation.  He will be scheduled for an office visit and chemotherapy on  07/09/2023.  He has iron deficiency anemia.  He will complete another series of IV iron therapy beginning 06/20/2023.  He will undergo restaging CTs within the next few months.  Thornton Papas, MD  06/18/2023  9:33 AM

## 2023-06-19 ENCOUNTER — Other Ambulatory Visit: Payer: Self-pay

## 2023-06-20 ENCOUNTER — Inpatient Hospital Stay: Payer: Medicare HMO

## 2023-06-20 VITALS — BP 117/69 | HR 78 | Temp 98.7°F | Resp 18

## 2023-06-20 DIAGNOSIS — C189 Malignant neoplasm of colon, unspecified: Secondary | ICD-10-CM

## 2023-06-20 DIAGNOSIS — Z5111 Encounter for antineoplastic chemotherapy: Secondary | ICD-10-CM | POA: Diagnosis not present

## 2023-06-20 DIAGNOSIS — D509 Iron deficiency anemia, unspecified: Secondary | ICD-10-CM

## 2023-06-20 MED ORDER — SODIUM CHLORIDE 0.9% FLUSH
10.0000 mL | INTRAVENOUS | Status: DC | PRN
  Administered 2023-06-20: 10 mL

## 2023-06-20 MED ORDER — PEGFILGRASTIM-JMDB 6 MG/0.6ML ~~LOC~~ SOSY
6.0000 mg | PREFILLED_SYRINGE | Freq: Once | SUBCUTANEOUS | Status: AC
  Administered 2023-06-20: 6 mg via SUBCUTANEOUS
  Filled 2023-06-20: qty 0.6

## 2023-06-20 MED ORDER — HEPARIN SOD (PORK) LOCK FLUSH 100 UNIT/ML IV SOLN
500.0000 [IU] | Freq: Once | INTRAVENOUS | Status: AC | PRN
  Administered 2023-06-20: 500 [IU]

## 2023-06-20 MED ORDER — SODIUM CHLORIDE 0.9 % IV SOLN
300.0000 mg | Freq: Once | INTRAVENOUS | Status: AC
  Administered 2023-06-20: 300 mg via INTRAVENOUS
  Filled 2023-06-20: qty 200

## 2023-06-20 MED ORDER — SODIUM CHLORIDE 0.9 % IV SOLN: INTRAVENOUS | Status: DC

## 2023-06-20 NOTE — Patient Instructions (Signed)
Iron Sucrose Injection What is this medication? IRON SUCROSE (EYE ern SOO krose) treats low levels of iron (iron deficiency anemia) in people with kidney disease. Iron is a mineral that plays an important role in making red blood cells, which carry oxygen from your lungs to the rest of your body. This medicine may be used for other purposes; ask your health care provider or pharmacist if you have questions. COMMON BRAND NAME(S): Venofer What should I tell my care team before I take this medication? They need to know if you have any of these conditions: Anemia not caused by low iron levels Heart disease High levels of iron in the blood Kidney disease Liver disease An unusual or allergic reaction to iron, other medications, foods, dyes, or preservatives Pregnant or trying to get pregnant Breastfeeding How should I use this medication? This medication is for infusion into a vein. It is given in a hospital or clinic setting. Talk to your care team about the use of this medication in children. While this medication may be prescribed for children as young as 2 years for selected conditions, precautions do apply. Overdosage: If you think you have taken too much of this medicine contact a poison control center or emergency room at once. NOTE: This medicine is only for you. Do not share this medicine with others. What if I miss a dose? Keep appointments for follow-up doses. It is important not to miss your dose. Call your care team if you are unable to keep an appointment. What may interact with this medication? Do not take this medication with any of the following: Deferoxamine Dimercaprol Other iron products This medication may also interact with the following: Chloramphenicol Deferasirox This list may not describe all possible interactions. Give your health care provider a list of all the medicines, herbs, non-prescription drugs, or dietary supplements you use. Also tell them if you smoke,  drink alcohol, or use illegal drugs. Some items may interact with your medicine. What should I watch for while using this medication? Visit your care team regularly. Tell your care team if your symptoms do not start to get better or if they get worse. You may need blood work done while you are taking this medication. You may need to follow a special diet. Talk to your care team. Foods that contain iron include: whole grains/cereals, dried fruits, beans, or peas, leafy green vegetables, and organ meats (liver, kidney). What side effects may I notice from receiving this medication? Side effects that you should report to your care team as soon as possible: Allergic reactions--skin rash, itching, hives, swelling of the face, lips, tongue, or throat Low blood pressure--dizziness, feeling faint or lightheaded, blurry vision Shortness of breath Side effects that usually do not require medical attention (report to your care team if they continue or are bothersome): Flushing Headache Joint pain Muscle pain Nausea Pain, redness, or irritation at injection site This list may not describe all possible side effects. Call your doctor for medical advice about side effects. You may report side effects to FDA at 1-800-FDA-1088. Where should I keep my medication? This medication is given in a hospital or clinic. It will not be stored at home. NOTE: This sheet is a summary. It may not cover all possible information. If you have questions about this medicine, talk to your doctor, pharmacist, or health care provider.  2024 Elsevier/Gold Standard (2023-01-19 00:00:00)

## 2023-06-25 ENCOUNTER — Other Ambulatory Visit: Payer: Self-pay

## 2023-06-25 ENCOUNTER — Encounter: Payer: Self-pay | Admitting: Oncology

## 2023-06-25 ENCOUNTER — Encounter (HOSPITAL_COMMUNITY): Payer: Self-pay | Admitting: Gastroenterology

## 2023-06-26 DIAGNOSIS — C182 Malignant neoplasm of ascending colon: Secondary | ICD-10-CM | POA: Diagnosis not present

## 2023-07-02 ENCOUNTER — Encounter: Payer: Self-pay | Admitting: Oncology

## 2023-07-02 ENCOUNTER — Inpatient Hospital Stay: Payer: Medicare HMO | Attending: Oncology

## 2023-07-02 ENCOUNTER — Inpatient Hospital Stay: Payer: Medicare HMO | Admitting: Nurse Practitioner

## 2023-07-02 ENCOUNTER — Inpatient Hospital Stay: Payer: Medicare HMO

## 2023-07-02 NOTE — H&P (Signed)
History of Present Illness    General:  57/male with history of metastatic colon cancer, referred for FOBT X 3 positive from 06/07/23 CT chest/abdomen/pelvis 05/02/23:cirrhosis, portal HTN, liver lesions,omental metastasis Moderately differentiated adenocarcinoma of the ascending colon, discovered on colonoscopy from 6/21 for positive FIT stage IIb (T4aN0), status post a right colectomy 04/12/2020 02/08/2022-biopsy and ablation of solitary liver lesion, adenocarcinoma consistent with metastatic colorectal adenocarcinoma Diagnostic laparoscopy 05/26/2022-mid small bowel mass-resected, well to moderately differentiated adenocarcinoma, 0/2 lymph nodes, morphology consistent with a colon primary, tumor involved full-thickness including serosa with perineural and large vessel involvement; foundation 1-microsatellite stable, tumor mutation burden 2, K-ras Q61H   Labs EPIC 06/04/23: Iron sat 7%, TIBC 543,Hb 8.2, MCV 84, Plt 184, normal LFTs and renal function, Capsule endoscopy 03/09/2022: Normal, will did not excision to the cecum at 8 hours, follow-up x-ray next day did not show PillCam retention Colonoscopy 10/28/2021: Hemorrhoids, patent side-to-side ileocolonic anastomosis, small lipoma,ascending colon tubular adenoma removed His stools are dark to normal brown, but his Hb has been dropping and he has needed blood transfusion last week and IV Iron a few months ago. He has acid reflux and heartburn and takes pantoprazole once a day. He has numbness and burning in his tongue from chemotherapy and had dental work so occasionally has difficulty swallowing, denies pain on swallowing. He has mild discomfort in upper abdomen. He has about 2 Bms a day, mostly loose, he had diarrhea while on chemotherapy. He has port a cath and may need chemotherapy for life, as he has been told it is not curative.  Current Medications amLODIPine Besylate 5 MG Tablet 1 tablet Orally Once a day Atorvastatin Calcium 40 MG Tablet 1  tablet Orally Once a day in the evening Carvedilol 6.25 MG Tablet 1 tablet with food Orally Twice a day , Notes to Pharmacist: sometimes once Insulin Glargine Solostar 100 UNIT/ML Solution Pen-injector 16 units Subcutaneous once a day , Notes to Pharmacist: 20 units-04/13/23 Lidocaine Viscous HCl 2 % Solution 1 teaspoon mixed with small amount of water, swish in mouth, then spit Mouth/Throat every 4 hrs Novofine Pen Needle(Insulin Pen Needle) 32G X 6 MM Miscellaneous for use with Lantus insulin as directed Ondansetron HCl 8 MG Tablet 1 tablet as needed for nausea Orally every 8 hrs OneTouch Delica Plus Lancet33G(Lancets) - Miscellaneous use to check blood sugar 3 times daily ICD 10:E11.69 OneTouch Verio(Glucose Blood) - Strip Use to check blood sugar In Vitro three times daily ICD 10:E11.69 OneTouch Verio Reflect(Blood Glucose Monitoring Suppl) w/Device Kit use to check blood sugar 3 times a day ICD 10:E11.69 Pantoprazole Sodium 40 MG Tablet Delayed Release 1 tablet Orally Once a day Pen Needles 32G X 6 MM Miscellaneous use to inject Lantus once a day Promethazine HCl 12.5 MG Tablet 1 tablet as needed for nausea Orally every 6 hrs Xanax(ALPRAZolam) 1 MG Tablet 1 tablet as needed Orally three times a day , Notes to Pharmacist: increased in hospital Zoloft(Sertraline HCl) 100 MG Tablet 1 tablet Orally Once a day Tylenol 325 MG Tablet 1 tablet Orally PRN Triamcinolone Acetonide 0.1 % Paste 1 application Externally Two times a Week , Notes to Pharmacist: Listed in Epic HYDROcodone-Acetaminophen 5-325 MG Tablet 1 tablet as needed Orally every 12 hrs MiraLax(Polyethylene Glycol 3350) 17 GM Packet 1 packet mixed with 8 ounces of fluid Orally Once a day Medication List reviewed and reconciled with the patient  Past Medical History Anxiety with panic attacks and OCD with Dr. Evelene Croon. Nephrolithiasis. Hypercholesterolemia. Hypertension. Positive FIT. Type  II DM. Colon cancer--s/p surg,  chemo.  Surgical History Kidney stone surgery 87 Colonoscopy 01/2020, 11/21, 10/2021 right colectomy Dr. Maisie Fus 03/2020 Laparoscopic assisted small bowel resection - Dr. Maisie Fus 04/2022  Family History Father: deceased, He died in his 30s from lung cancer after being a heavy smoker. Mother: alive 36 yrs, CVA, 41, HTN, DM, Leukemia June 2024 - just diagnosed with colon cancer two weeks ago, diagnosed with Hypertension, Colon cancer, Diabetes Sister 1: HTN No Family History of  Liver Disease Maternal uncle had colon cancer.  Social History    General:  Tobacco use  cigarettes: Never smoked Tobacco history last updated 06/13/2023 Vaping No Alcohol: no, never. Caffeine: tea, 2+ servings daily. Recreational drug use: no, none. DIET: no particular dietary program, does omit spicy foods. Exercise: NONE. Marital Status: Divorced. Children: 2 Matthew 95, Leann 2001. EDUCATION: High School, Some College. OCCUPATION: Astronomer. Religion: [employer]. Seat belt use: always. Living with: (June 2024) - his mother.  Allergies Losartan Potassium-HCTZ: lip swelling - Allergy Flexeril: drowsy/sleepwalking into closet - Side Effects Chlorthalidone: hypokalemia - Side Effects Amlodipine Besylate: 10 mg leads to pedal edema; tolerates 5 mg - Side Effects Prednisone: accelerated heart rate, anxiety - Allergy Hospitalization/Major Diagnostic Procedure Not since Colectomy surgery in August 06/2020 not in the past year 05/2023  Vital Signs Wt: 242.6, Wt change: -2.4 lbs, Ht: 66, BMI: 39.15, Temp: 98.6, Pulse sitting: 93, BP sitting: 131/79. Examination GENERAL APPEARANCE:  Well developed, overweight, no active distress, pleasant.  SCLERA:  anicteric.  CARDIOVASCULAR  Normal RRR .  RESPIRATORY  Breath sounds normal. Respiration even and unlabored.  ABDOMEN  No masses palpated. Liver and spleen not palpated, normal. Bowel sounds normal, Abdomen not distended.  EXTREMITIES:  No edema.   NEURO:  alert, oriented to time, place and person, normal gait.  PSYCH:  mood/affect normal.   Assessments 1. Anemia, unspecified type - D64.9 (Primary)    2. History of colon cancer - Z85.038    3. Hepatic cirrhosis, unspecified hepatic cirrhosis type, unspecified whether ascites present - K74.60    a Treatment 1. Anemia, unspecified type      EGD with Band Ligation of Varices    Colonoscopy (Ordered for 06/13/2023)   Notes: He could be bleeding from esophageal varices, portal hypertensive gastropathy, colonic lesion, hemorrhoids. Recommend diagnostic EGD and possible banding of varices if found. Recommend diagnostic colonoscopy.    2. History of colon cancer        Notes: He has stage IV colon cancer and is undergoing chemotherapy, not for cure but to ensure that there is no further progression of metastatic disease.    3. Hepatic cirrhosis, unspecified hepatic cirrhosis type, unspecified whether ascites present       Notes: No signs of encephalopathy, no history of esophageal variceal bleeding, no ascites noted on CAT scan. Cirrhosis likely related to fatty liver but will get labs for chronic hepatitis B, hepatitis C, iron panel and ferritin, ceruloplasmin, alpha-1 antitrypsin, autoimmune hepatitis and primary biliary cholangitis. Will get CMP, PT/INR for MELD sodium calculation. Will get alpha-fetoprotein, patient has liver lesion consistent with metastatic colon cancer but no history of hepatocellular carcinoma.                       GLUCOSE  252 H 70-99 - mg/dL      BUN  10  3-08 - mg/dL      CREATININE 6.57  8.46-9.62 - mg/dl      eGFR  103  >60 - calc      SODIUM  140  136-145 - mmol/L      POTASSIUM 4.3  3.5-5.5 - mmol/L      CHLORIDE 105  98-107 - mmol/L      C02 30  22-32 - mmol/L      ANION GAP 8.5  6.0-20.0 - mmol/L      CALCIUM  8.6  8.6-10.3 - mg/dL      CA-Alb corrected 9.81  8.60-10.30 - mg/dL      T PROTEIN 6.8  1.9-1.4 - g/dL      ALBUMIN  3.9  7.8-2.9 -  g/dL      T.BILI  0.8  0.3-1.0 - mg/dL      ALP  562 H 13-086 - U/L      AST  37  0-39 - U/L      ALT   40  0-52 - U/L       HBsAg Screen Negative  Negative -      Prothrombin Time 11.0  9.1-12.0 - sec      INR 1.0  0.9-1.2 -      HCV Ab Non Reactive  Non Reactive -      Ceruloplasmin 35.9 H 16.0-31.0 - mg/dL           AFP, Serum, Tumor Marker 2.5  0.0-8.4 - ng/mL       Actin (Smooth Muscle) Antibody 4  0-19 - Units      Mitochondrial (M2) Antibody <20.0  0.0-20.0 - Units

## 2023-07-03 ENCOUNTER — Ambulatory Visit (HOSPITAL_COMMUNITY)
Admission: RE | Admit: 2023-07-03 | Discharge: 2023-07-03 | Disposition: A | Discharge status: Home / Self Care | Payer: Medicare HMO | Attending: Gastroenterology | Admitting: Gastroenterology

## 2023-07-03 ENCOUNTER — Other Ambulatory Visit: Payer: Self-pay

## 2023-07-03 ENCOUNTER — Ambulatory Visit (HOSPITAL_COMMUNITY): Payer: Medicare HMO | Admitting: Anesthesiology

## 2023-07-03 ENCOUNTER — Encounter (HOSPITAL_COMMUNITY): Payer: Self-pay | Admitting: Gastroenterology

## 2023-07-03 ENCOUNTER — Encounter (HOSPITAL_COMMUNITY)
Admission: RE | Disposition: A | Discharge status: Home / Self Care | Payer: Self-pay | Source: Home / Self Care | Attending: Gastroenterology

## 2023-07-03 DIAGNOSIS — D500 Iron deficiency anemia secondary to blood loss (chronic): Secondary | ICD-10-CM | POA: Insufficient documentation

## 2023-07-03 DIAGNOSIS — K7460 Unspecified cirrhosis of liver: Secondary | ICD-10-CM | POA: Insufficient documentation

## 2023-07-03 DIAGNOSIS — Z980 Intestinal bypass and anastomosis status: Secondary | ICD-10-CM | POA: Diagnosis not present

## 2023-07-03 DIAGNOSIS — Z8249 Family history of ischemic heart disease and other diseases of the circulatory system: Secondary | ICD-10-CM | POA: Insufficient documentation

## 2023-07-03 DIAGNOSIS — I10 Essential (primary) hypertension: Secondary | ICD-10-CM

## 2023-07-03 DIAGNOSIS — Z794 Long term (current) use of insulin: Secondary | ICD-10-CM | POA: Diagnosis not present

## 2023-07-03 DIAGNOSIS — K219 Gastro-esophageal reflux disease without esophagitis: Secondary | ICD-10-CM | POA: Insufficient documentation

## 2023-07-03 DIAGNOSIS — Z800 Family history of malignant neoplasm of digestive organs: Secondary | ICD-10-CM | POA: Diagnosis not present

## 2023-07-03 DIAGNOSIS — D649 Anemia, unspecified: Secondary | ICD-10-CM | POA: Diagnosis not present

## 2023-07-03 DIAGNOSIS — K648 Other hemorrhoids: Secondary | ICD-10-CM | POA: Insufficient documentation

## 2023-07-03 DIAGNOSIS — K922 Gastrointestinal hemorrhage, unspecified: Secondary | ICD-10-CM | POA: Diagnosis not present

## 2023-07-03 DIAGNOSIS — Z85038 Personal history of other malignant neoplasm of large intestine: Secondary | ICD-10-CM | POA: Diagnosis not present

## 2023-07-03 DIAGNOSIS — F418 Other specified anxiety disorders: Secondary | ICD-10-CM | POA: Diagnosis not present

## 2023-07-03 DIAGNOSIS — K317 Polyp of stomach and duodenum: Secondary | ICD-10-CM | POA: Insufficient documentation

## 2023-07-03 DIAGNOSIS — M1990 Unspecified osteoarthritis, unspecified site: Secondary | ICD-10-CM | POA: Insufficient documentation

## 2023-07-03 DIAGNOSIS — Z79899 Other long term (current) drug therapy: Secondary | ICD-10-CM | POA: Diagnosis not present

## 2023-07-03 DIAGNOSIS — Z833 Family history of diabetes mellitus: Secondary | ICD-10-CM | POA: Insufficient documentation

## 2023-07-03 DIAGNOSIS — E119 Type 2 diabetes mellitus without complications: Secondary | ICD-10-CM | POA: Insufficient documentation

## 2023-07-03 DIAGNOSIS — K766 Portal hypertension: Secondary | ICD-10-CM | POA: Insufficient documentation

## 2023-07-03 DIAGNOSIS — G4730 Sleep apnea, unspecified: Secondary | ICD-10-CM | POA: Insufficient documentation

## 2023-07-03 DIAGNOSIS — D509 Iron deficiency anemia, unspecified: Secondary | ICD-10-CM | POA: Diagnosis not present

## 2023-07-03 DIAGNOSIS — K3189 Other diseases of stomach and duodenum: Secondary | ICD-10-CM | POA: Insufficient documentation

## 2023-07-03 HISTORY — PX: HOT HEMOSTASIS: SHX5433

## 2023-07-03 HISTORY — PX: COLONOSCOPY WITH PROPOFOL: SHX5780

## 2023-07-03 HISTORY — PX: ESOPHAGOGASTRODUODENOSCOPY (EGD) WITH PROPOFOL: SHX5813

## 2023-07-03 LAB — GLUCOSE, CAPILLARY: Glucose-Capillary: 147 mg/dL — ABNORMAL HIGH (ref 70–99)

## 2023-07-03 SURGERY — ESOPHAGOGASTRODUODENOSCOPY (EGD) WITH PROPOFOL
Anesthesia: Monitor Anesthesia Care

## 2023-07-03 MED ORDER — PROPOFOL 500 MG/50ML IV EMUL
INTRAVENOUS | Status: AC
  Filled 2023-07-03: qty 50

## 2023-07-03 MED ORDER — MIDAZOLAM HCL 5 MG/5ML IJ SOLN
INTRAMUSCULAR | Status: DC | PRN
  Administered 2023-07-03: 2 mg via INTRAVENOUS

## 2023-07-03 MED ORDER — DEXMEDETOMIDINE HCL IN NACL 80 MCG/20ML IV SOLN
INTRAVENOUS | Status: AC
  Filled 2023-07-03: qty 20

## 2023-07-03 MED ORDER — PROPOFOL 1000 MG/100ML IV EMUL
INTRAVENOUS | Status: AC
  Filled 2023-07-03: qty 100

## 2023-07-03 MED ORDER — SODIUM CHLORIDE 0.9 % IV SOLN: INTRAVENOUS | Status: DC | PRN

## 2023-07-03 MED ORDER — PROPOFOL 500 MG/50ML IV EMUL
INTRAVENOUS | Status: DC | PRN
  Administered 2023-07-03: 155 ug/kg/min via INTRAVENOUS

## 2023-07-03 MED ORDER — DEXMEDETOMIDINE HCL IN NACL 80 MCG/20ML IV SOLN
INTRAVENOUS | Status: DC | PRN
  Administered 2023-07-03 (×2): 4 ug via INTRAVENOUS

## 2023-07-03 MED ORDER — SODIUM CHLORIDE 0.9% FLUSH: 10.0000 mL | Freq: Two times a day (BID) | INTRAVENOUS | Status: DC

## 2023-07-03 MED ORDER — MIDAZOLAM HCL 2 MG/2ML IJ SOLN
INTRAMUSCULAR | Status: AC
  Filled 2023-07-03: qty 2

## 2023-07-03 SURGICAL SUPPLY — 25 items
BLOCK BITE 60FR ADLT L/F BLUE (MISCELLANEOUS) ×1 IMPLANT
ELECT REM PT RETURN 9FT ADLT (ELECTROSURGICAL)
ELECTRODE REM PT RTRN 9FT ADLT (ELECTROSURGICAL) IMPLANT
FCP BXJMBJMB 240X2.8X (CUTTING FORCEPS)
FLOOR PAD 36X40 (MISCELLANEOUS) ×1
FORCEP RJ3 GP 1.8X160 W-NEEDLE (CUTTING FORCEPS) IMPLANT
FORCEPS BIOP RAD 4 LRG CAP 4 (CUTTING FORCEPS) IMPLANT
FORCEPS BIOP RJ4 240 W/NDL (CUTTING FORCEPS)
FORCEPS BXJMBJMB 240X2.8X (CUTTING FORCEPS) IMPLANT
INJECTOR/SNARE I SNARE (MISCELLANEOUS) IMPLANT
LUBRICANT JELLY 4.5OZ STERILE (MISCELLANEOUS) IMPLANT
MANIFOLD NEPTUNE II (INSTRUMENTS) IMPLANT
NDL SCLEROTHERAPY 25GX240 (NEEDLE) IMPLANT
NEEDLE SCLEROTHERAPY 25GX240 (NEEDLE)
PAD FLOOR 36X40 (MISCELLANEOUS) ×1 IMPLANT
PROBE APC STR FIRE (PROBE) IMPLANT
PROBE INJECTION GOLD (MISCELLANEOUS)
PROBE INJECTION GOLD 7FR (MISCELLANEOUS) IMPLANT
SNARE ROTATE MED OVAL 20MM (MISCELLANEOUS) IMPLANT
SNARE SHORT THROW 13M SML OVAL (MISCELLANEOUS) IMPLANT
SYR 50ML LL SCALE MARK (SYRINGE) IMPLANT
TRAP SPECIMEN MUCOUS 40CC (MISCELLANEOUS) IMPLANT
TUBING ENDO SMARTCAP PENTAX (MISCELLANEOUS) ×2 IMPLANT
TUBING IRRIGATION ENDOGATOR (MISCELLANEOUS) ×1 IMPLANT
WATER STERILE IRR 1000ML POUR (IV SOLUTION) IMPLANT

## 2023-07-03 NOTE — Op Note (Signed)
[facility] Patient Name: Anthony Calhoun Procedure Date: 07/03/2023 MRN: [id number] Attending MD: Kerin Salen , MD, [id number] Date of Birth: [birth year] CSN: [id number] Age: 57 Admit Type: Outpatient Procedure:                Colonoscopy Indications:              Iron deficiency anemia, history of colon cancer,                            s/p surgery, newly diagnosed cirrhosis Providers:                Kerin Salen, MD, Marge Duncans, RN, Rozetta Nunnery, Technician Referring MD:             Tally Joe, MD Medicines:                Monitored Anesthesia Care Complications:            No immediate complications. Estimated Blood Loss:     Estimated blood loss: none. Procedure:                Pre-Anesthesia Assessment:                           - Prior to the procedure, a History and Physical                            was performed, and patient medications and                            allergies were reviewed. The patient's tolerance of                            previous anesthesia was also reviewed. The risks                            and benefits of the procedure and the sedation                            options and risks were discussed with the patient.                            All questions were answered, and informed consent                            was obtained. Prior Anticoagulants: The patient has                            taken no anticoagulant or antiplatelet agents. ASA                            Grade Assessment: III - A patient with severe  systemic disease. After reviewing the risks and                            benefits, the patient was deemed in satisfactory                            condition to undergo the procedure.                           - Prior to the procedure, a History and Physical                            was performed, and patient medications and                             allergies were reviewed. The patient's tolerance of                            previous anesthesia was also reviewed. The risks                            and benefits of the procedure and the sedation                            options and risks were discussed with the patient.                            All questions were answered, and informed consent                            was obtained. Prior Anticoagulants: The patient has                            taken no anticoagulant or antiplatelet agents. ASA                            Grade Assessment: III - A patient with severe                            systemic disease. After reviewing the risks and                            benefits, the patient was deemed in satisfactory                            condition to undergo the procedure.                           After obtaining informed consent, the colonoscope                            was passed under direct vision. Throughout the  procedure, the patient's blood pressure, pulse, and                            oxygen saturations were monitored continuously. The                            [id number]-HQ190L ([id number]) Olympus colonoscope was                            introduced through the anus and advanced to the the                            ileocolonic anastomosis. The colonoscopy was                            performed without difficulty. The patient tolerated                            the procedure well. The quality of the bowel                            preparation was fair. The neo-terminal ileum,                            ielo-colonic anastomosis and the rectum were                            photographed. Scope In: 9:46:57 AM Scope Out: 9:59:38 AM Scope Withdrawal Time: 0 hours 11 minutes 24 seconds  Total Procedure Duration: 0 hours 12 minutes 41 seconds  Findings:      Hemorrhoids were found on perianal exam.      Dark, old, maroon colored blood was  found in the entire colon. On       thorough lavage, there was no evidence of active or recent bleeding       noted in colon.      The neo-terminal ileum appeared normal. Old blood was noted in the       neoterminal ileum as well.      There was evidence of a prior end-to-side ileo-colonic anastomosis in       the cecum. This was patent and was characterized by healthy appearing       mucosa. The anastomosis was traversed.      Non-bleeding internal hemorrhoids were found during retroflexion.      The exam was otherwise without abnormality. Impression:               - Preparation of the colon was fair.                           - Hemorrhoids found on perianal exam.                           - Blood in the entire examined colon.                           - The examined portion of the ileum was normal.                           -  Patent end-to-side ileo-colonic anastomosis,                            characterized by healthy appearing mucosa.                           - Non-bleeding internal hemorrhoids.                           - The examination was otherwise normal.                           - No specimens collected. Moderate Sedation:      Patient did not receive moderate sedation for this procedure, but       instead received monitored anesthesia care. Recommendation:           - Patient has a contact number available for                            emergencies. The signs and symptoms of potential                            delayed complications were discussed with the                            patient. Return to normal activities tomorrow.                            Written discharge instructions were provided to the                            patient.                           - Resume regular diet.                           - Continue present medications.                           - Repeat colonoscopy in 5 years for surveillance. Procedure Code(s):        --- Professional ---                            [phone], Colonoscopy, flexible; diagnostic, including                            collection of specimen(s) by brushing or washing,                            when performed (separate procedure) Diagnosis Code(s):        --- Professional ---                           X91.4, Other hemorrhoids  K92.2, Gastrointestinal hemorrhage, unspecified                           Z98.0, Intestinal bypass and anastomosis status                           D50.9, Iron deficiency anemia, unspecified CPT copyright 2022 American [employer]. All rights reserved. The codes documented in this report are preliminary and upon coder review may  be revised to meet current compliance requirements. Kerin Salen, MD 07/03/2023 10:11:06 AM This report has been signed electronically. Number of Addenda: 0

## 2023-07-03 NOTE — Anesthesia Postprocedure Evaluation (Signed)
Anesthesia Post Note  Patient: Anthony Calhoun  Procedure(s) Performed: ESOPHAGOGASTRODUODENOSCOPY (EGD) WITH PROPOFOL COLONOSCOPY WITH PROPOFOL HOT HEMOSTASIS (ARGON PLASMA COAGULATION/BICAP)     Patient location during evaluation: PACU Anesthesia Type: MAC Level of consciousness: awake and alert Pain management: pain level controlled Vital Signs Assessment: post-procedure vital signs reviewed and stable Respiratory status: spontaneous breathing, nonlabored ventilation, respiratory function stable and patient connected to nasal cannula oxygen Cardiovascular status: stable and blood pressure returned to baseline Postop Assessment: no apparent nausea or vomiting Anesthetic complications: no   No notable events documented.  Last Vitals:  Vitals:   07/03/23 1014 07/03/23 1024  BP: 122/77 138/71  Pulse: 77 78  Resp: 10 17  Temp:    SpO2: 100% 96%    Last Pain:  Vitals:   07/03/23 1024  TempSrc:   PainSc: 0-No pain                 Trinadee Verhagen

## 2023-07-03 NOTE — Anesthesia Preprocedure Evaluation (Signed)
Anesthesia Evaluation  Patient identified by MRN, date of birth, ID band Patient awake    Reviewed: Allergy & Precautions, NPO status , Patient's Chart, lab work & pertinent test results  Airway Mallampati: III  TM Distance: >3 FB Neck ROM: Full  Mouth opening: Limited Mouth Opening  Dental no notable dental hx. (+) Teeth Intact, Dental Advisory Given   Pulmonary sleep apnea    Pulmonary exam normal breath sounds clear to auscultation       Cardiovascular hypertension, Pt. on medications Normal cardiovascular exam Rhythm:Regular Rate:Normal     Neuro/Psych   Anxiety Depression       GI/Hepatic ,GERD  ,,Metastatic Colon CA   Endo/Other  diabetes    Renal/GU      Musculoskeletal  (+) Arthritis , Osteoarthritis,    Abdominal  (+) + obese  Peds  Hematology  (+) Blood dyscrasia, anemia   Anesthesia Other Findings ALL: chlorthalidone, Cylobenzaprine, gabapentin, losartan,  prednisone  Reproductive/Obstetrics                              Anesthesia Physical Anesthesia Plan  ASA: 3  Anesthesia Plan: MAC   Post-op Pain Management: Minimal or no pain anticipated   Induction: Intravenous  PONV Risk Score and Plan: 2 and Propofol infusion  Airway Management Planned: Natural Airway and Simple Face Mask  Additional Equipment: None  Intra-op Plan:   Post-operative Plan: Extubation in OR  Informed Consent: I have reviewed the patients History and Physical, chart, labs and discussed the procedure including the risks, benefits and alternatives for the proposed anesthesia with the patient or authorized representative who has indicated his/her understanding and acceptance.     Dental advisory given  Plan Discussed with: CRNA and Anesthesiologist  Anesthesia Plan Comments:          Anesthesia Quick Evaluation

## 2023-07-03 NOTE — Discharge Instructions (Signed)

## 2023-07-03 NOTE — Op Note (Signed)
[facility] Patient Name: Anthony Calhoun Procedure Date: 07/03/2023 MRN: [id number] Attending MD: Kerin Salen , MD, [id number] Date of Birth: 1966-04-14 CSN: [id number] Age: 57 Admit Type: Outpatient Procedure:                Upper GI endoscopy Indications:              Iron deficiency anemia secondary to chronic blood                            loss, Cirrhosis rule out esophageal varices Providers:                Kerin Salen, MD, Marge Duncans, RN, Rozetta Nunnery, Technician Referring MD:             Tally Joe, MD Medicines:                Monitored Anesthesia Care Complications:            No immediate complications. Estimated Blood Loss:     Estimated blood loss: none. Procedure:                Pre-Anesthesia Assessment:                           - Prior to the procedure, a History and Physical                            was performed, and patient medications and                            allergies were reviewed. The patient's tolerance of                            previous anesthesia was also reviewed. The risks                            and benefits of the procedure and the sedation                            options and risks were discussed with the patient.                            All questions were answered, and informed consent                            was obtained. Prior Anticoagulants: The patient has                            taken no anticoagulant or antiplatelet agents. ASA                            Grade Assessment: III - A patient with severe  systemic disease. After reviewing the risks and                            benefits, the patient was deemed in satisfactory                            condition to undergo the procedure.                           After obtaining informed consent, the endoscope was                            passed under direct vision. Throughout the                             procedure, the patient's blood pressure, pulse, and                            oxygen saturations were monitored continuously. The                            [id number]-H190 ([id number]) Olympus endoscope was introduced                            through the mouth, and advanced to the second part                            of duodenum. The upper GI endoscopy was                            accomplished without difficulty. The patient                            tolerated the procedure well. Scope In: Scope Out: Findings:      The examined esophagus was normal.      The Z-line was regular and was found 40 cm from the incisors.      Red blood was found on the greater curvature of the stomach and in the       gastric antrum.      Severe portal hypertensive gastropathy was found in the gastric fundus,       in the gastric body and in the stomach. Coagulation for hemostasis using       argon plasma at 1 liter/minute and 20 watts was successful.      A few 6 mm sessile polyps were found in the gastric fundus and in the       gastric body.      The examined duodenum was normal. Impression:               - Normal esophagus.                           - Z-line regular, 40 cm from the incisors.                           - Red blood  in the greater curvature of the stomach                            and in the gastric antrum.                           - Portal hypertensive gastropathy. Treated with                            argon plasma coagulation (APC).                           - A few gastric polyps.                           - Normal examined duodenum.                           - No specimens collected. Moderate Sedation:      Patient did not receive moderate sedation for this procedure, but       instead received monitored anesthesia care. Recommendation:           - Patient has a contact number available for                            emergencies. The signs and symptoms of potential                             delayed complications were discussed with the                            patient. Return to normal activities tomorrow.                            Written discharge instructions were provided to the                            patient.                           - Resume regular diet.                           - Continue present medications.                           - PPI BID indefinitely and ferrous sulfate 325 mg                            po daily indefinitely. Procedure Code(s):        --- Professional ---                           [phone], Esophagogastroduodenoscopy, flexible,                            transoral; with control of  bleeding, any method Diagnosis Code(s):        --- Professional ---                           K92.2, Gastrointestinal hemorrhage, unspecified                           K76.6, Portal hypertension                           K31.89, Other diseases of stomach and duodenum                           K31.7, Polyp of stomach and duodenum                           D50.0, Iron deficiency anemia secondary to blood                            loss (chronic)                           K74.60, Unspecified cirrhosis of liver CPT copyright 2022 [employer]. All rights reserved. The codes documented in this report are preliminary and upon coder review may  be revised to meet current compliance requirements. Kerin Salen, MD 07/03/2023 10:06:31 AM This report has been signed electronically. Number of Addenda: 0

## 2023-07-03 NOTE — Interval H&P Note (Signed)
History and Physical Interval Note: 57/male with cirrhosis, colon cancer, s/p surgery, anemia for EGD with possible banding and colonoscopy with propofol.  07/03/2023 8:44 AM  Anthony Calhoun  has presented today for EGD with possible banding and colonoscopy with propofo, with the diagnosis of anemia and history of colon cancer.  The various methods of treatment have been discussed with the patient and family. After consideration of risks, benefits and other options for treatment, the patient has consented to  Procedure(s) with comments: ESOPHAGOGASTRODUODENOSCOPY (EGD) WITH PROPOFOL (N/A) - with banding COLONOSCOPY WITH PROPOFOL (N/A) as a surgical intervention.  The patient's history has been reviewed, patient examined, no change in status, stable for surgery.  I have reviewed the patient's chart and labs.  Questions were answered to the patient's satisfaction.     Kerin Salen

## 2023-07-03 NOTE — Transfer of Care (Signed)
Immediate Anesthesia Transfer of Care Note  Patient: Anthony Calhoun  Procedure(s) Performed: Procedure(s) with comments: ESOPHAGOGASTRODUODENOSCOPY (EGD) WITH PROPOFOL (N/A) - with banding COLONOSCOPY WITH PROPOFOL (N/A) HOT HEMOSTASIS (ARGON PLASMA COAGULATION/BICAP) (N/A)  Patient Location: Endoscopy Unit  Anesthesia Type:MAC  Level of Consciousness:  sedated, patient cooperative and responds to stimulation  Airway & Oxygen Therapy:Patient Spontanous Breathing and Patient connected to face mask oxgen  Post-op Assessment:  Report given to PACU RN and Post -op Vital signs reviewed and stable  Post vital signs:  Reviewed and stable  Last Vitals:  Vitals:   07/03/23 0847  BP: (!) 168/87  Pulse: 89  Resp: (!) 25  Temp: (!) 36.4 C  SpO2: 97%    Complications: No apparent anesthesia complications

## 2023-07-04 ENCOUNTER — Inpatient Hospital Stay: Payer: Medicare HMO

## 2023-07-05 ENCOUNTER — Encounter (HOSPITAL_COMMUNITY): Payer: Self-pay | Admitting: Gastroenterology

## 2023-07-05 ENCOUNTER — Encounter: Payer: Self-pay | Admitting: Oncology

## 2023-07-06 ENCOUNTER — Encounter: Payer: Self-pay | Admitting: Oncology

## 2023-07-07 ENCOUNTER — Other Ambulatory Visit: Payer: Self-pay | Admitting: Oncology

## 2023-07-09 ENCOUNTER — Inpatient Hospital Stay: Payer: Medicare HMO

## 2023-07-09 ENCOUNTER — Encounter: Payer: Self-pay | Admitting: Oncology

## 2023-07-09 ENCOUNTER — Inpatient Hospital Stay: Payer: Medicare HMO | Attending: Oncology

## 2023-07-09 ENCOUNTER — Inpatient Hospital Stay (HOSPITAL_BASED_OUTPATIENT_CLINIC_OR_DEPARTMENT_OTHER): Payer: Medicare HMO | Admitting: Nurse Practitioner

## 2023-07-09 ENCOUNTER — Encounter: Payer: Self-pay | Admitting: Nurse Practitioner

## 2023-07-09 VITALS — BP 117/63 | HR 77 | Temp 98.1°F | Resp 16 | Ht 66.0 in | Wt 245.8 lb

## 2023-07-09 VITALS — BP 121/64 | HR 75 | Temp 98.1°F | Resp 18

## 2023-07-09 DIAGNOSIS — C189 Malignant neoplasm of colon, unspecified: Secondary | ICD-10-CM

## 2023-07-09 DIAGNOSIS — C182 Malignant neoplasm of ascending colon: Secondary | ICD-10-CM | POA: Diagnosis not present

## 2023-07-09 DIAGNOSIS — F410 Panic disorder [episodic paroxysmal anxiety] without agoraphobia: Secondary | ICD-10-CM | POA: Insufficient documentation

## 2023-07-09 DIAGNOSIS — Z23 Encounter for immunization: Secondary | ICD-10-CM | POA: Diagnosis not present

## 2023-07-09 DIAGNOSIS — Z79899 Other long term (current) drug therapy: Secondary | ICD-10-CM | POA: Insufficient documentation

## 2023-07-09 DIAGNOSIS — K219 Gastro-esophageal reflux disease without esophagitis: Secondary | ICD-10-CM | POA: Diagnosis not present

## 2023-07-09 DIAGNOSIS — I10 Essential (primary) hypertension: Secondary | ICD-10-CM | POA: Diagnosis not present

## 2023-07-09 DIAGNOSIS — K648 Other hemorrhoids: Secondary | ICD-10-CM | POA: Diagnosis not present

## 2023-07-09 DIAGNOSIS — C787 Secondary malignant neoplasm of liver and intrahepatic bile duct: Secondary | ICD-10-CM

## 2023-07-09 DIAGNOSIS — D509 Iron deficiency anemia, unspecified: Secondary | ICD-10-CM

## 2023-07-09 DIAGNOSIS — E785 Hyperlipidemia, unspecified: Secondary | ICD-10-CM | POA: Insufficient documentation

## 2023-07-09 DIAGNOSIS — Z5111 Encounter for antineoplastic chemotherapy: Secondary | ICD-10-CM | POA: Diagnosis not present

## 2023-07-09 DIAGNOSIS — F32A Depression, unspecified: Secondary | ICD-10-CM | POA: Insufficient documentation

## 2023-07-09 LAB — CBC WITH DIFFERENTIAL (CANCER CENTER ONLY)
Abs Immature Granulocytes: 0.05 10*3/uL (ref 0.00–0.07)
Basophils Absolute: 0.1 10*3/uL (ref 0.0–0.1)
Basophils Relative: 1 %
Eosinophils Absolute: 0.1 10*3/uL (ref 0.0–0.5)
Eosinophils Relative: 2 %
HCT: 27.9 % — ABNORMAL LOW (ref 39.0–52.0)
Hemoglobin: 8.1 g/dL — ABNORMAL LOW (ref 13.0–17.0)
Immature Granulocytes: 1 %
Lymphocytes Relative: 18 %
Lymphs Abs: 0.9 10*3/uL (ref 0.7–4.0)
MCH: 24.3 pg — ABNORMAL LOW (ref 26.0–34.0)
MCHC: 29 g/dL — ABNORMAL LOW (ref 30.0–36.0)
MCV: 83.8 fL (ref 80.0–100.0)
Monocytes Absolute: 0.4 10*3/uL (ref 0.1–1.0)
Monocytes Relative: 8 %
Neutro Abs: 3.5 10*3/uL (ref 1.7–7.7)
Neutrophils Relative %: 70 %
Platelet Count: 184 10*3/uL (ref 150–400)
RBC: 3.33 MIL/uL — ABNORMAL LOW (ref 4.22–5.81)
RDW: 18.3 % — ABNORMAL HIGH (ref 11.5–15.5)
WBC Count: 5.1 10*3/uL (ref 4.0–10.5)
nRBC: 0 % (ref 0.0–0.2)

## 2023-07-09 LAB — CMP (CANCER CENTER ONLY)
ALT: 28 U/L (ref 0–44)
AST: 27 U/L (ref 15–41)
Albumin: 3.6 g/dL (ref 3.5–5.0)
Alkaline Phosphatase: 138 U/L — ABNORMAL HIGH (ref 38–126)
Anion gap: 6 (ref 5–15)
BUN: 10 mg/dL (ref 6–20)
CO2: 27 mmol/L (ref 22–32)
Calcium: 8.7 mg/dL — ABNORMAL LOW (ref 8.9–10.3)
Chloride: 103 mmol/L (ref 98–111)
Creatinine: 0.73 mg/dL (ref 0.61–1.24)
GFR, Estimated: >60 mL/min (ref >60)
Glucose, Bld: 261 mg/dL — ABNORMAL HIGH (ref 70–99)
Potassium: 3.7 mmol/L (ref 3.5–5.1)
Sodium: 136 mmol/L (ref 135–145)
Total Bilirubin: 0.7 mg/dL (ref <1.2)
Total Protein: 6.8 g/dL (ref 6.5–8.1)

## 2023-07-09 LAB — CEA (ACCESS): CEA (CHCC): 6.7 ng/mL — ABNORMAL HIGH (ref 0.00–5.00)

## 2023-07-09 LAB — TOTAL PROTEIN, URINE DIPSTICK

## 2023-07-09 MED ORDER — IRON SUCROSE 20 MG/ML IV SOLN
300.0000 mg | Freq: Once | INTRAVENOUS | Status: AC
  Administered 2023-07-09: 300 mg via INTRAVENOUS
  Filled 2023-07-09: qty 300

## 2023-07-09 MED ORDER — SODIUM CHLORIDE 0.9 % IV SOLN
INTRAVENOUS | Status: DC
Start: 2023-07-09 — End: 2023-07-09

## 2023-07-09 MED ORDER — INFLUENZA VIRUS VACC SPLIT PF (FLUZONE) 0.5 ML IM SUSY
0.5000 mL | PREFILLED_SYRINGE | Freq: Once | INTRAMUSCULAR | Status: AC
  Administered 2023-07-09: 0.5 mL via INTRAMUSCULAR
  Filled 2023-07-09: qty 0.5

## 2023-07-09 MED ORDER — SODIUM CHLORIDE 0.9% FLUSH
10.0000 mL | Freq: Once | INTRAVENOUS | Status: AC | PRN
  Administered 2023-07-09: 10 mL

## 2023-07-09 MED ORDER — HEPARIN SOD (PORK) LOCK FLUSH 100 UNIT/ML IV SOLN
500.0000 [IU] | Freq: Once | INTRAVENOUS | Status: AC | PRN
  Administered 2023-07-09: 500 [IU]

## 2023-07-09 NOTE — Progress Notes (Signed)
[facility] OFFICE PROGRESS NOTE   Diagnosis: Colon cancer  INTERVAL HISTORY:   Anthony Calhoun returns as scheduled.  He underwent an upper endoscopy and colonoscopy 07/03/2023.  He was found to have severe portal hypertensive gastropathy in the gastric fundus, gastric body, stomach and was treated with APC.  Colonoscopy showed dark, old, maroon-colored blood in the entire colon; no evidence of active or recent bleeding noted in the colon.  He notes stools are more brown in color, no longer black.  He vomited 1 time with the colonoscopy prep.  He is fatigued.  He does not want to do chemotherapy today.  Objective:  Vital signs in last 24 hours:  Blood pressure 117/63, pulse 77, temperature 98.1 F (36.7 C), temperature source Oral, resp. rate 16, height 5\' 6"  (1.676 m), weight 245 lb 12.8 oz (111.5 kg), SpO2 98%.    HEENT: No thrush or ulcers. Resp: Lungs clear bilaterally. Cardio: Regular rate and rhythm. GI: Abdomen soft and nontender.  No hepatosplenomegaly. Vascular: No leg edema. Neuro: Alert and oriented. Skin: Pale appearing. Port-A-Cath without erythema.  Lab Results:  Lab Results  Component Value Date   WBC 5.1 07/09/2023   HGB 8.1 (L) 07/09/2023   HCT 27.9 (L) 07/09/2023   MCV 83.8 07/09/2023   PLT 184 07/09/2023   NEUTROABS 3.5 07/09/2023    Imaging:  No results found.  Medications: I have reviewed the patient's current medications.  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 secondary to nausea (patient discontinued Xeloda on day 11 due to colonoscopy) Colonoscopy 07/09/2020-nodular mucosa at the colonic anastomosis, biopsied; patent end-to-side ileocolonic anastomosis characterized by healthy-appearing mucosa and visible sutures; nonbleeding internal hemorrhoids, biopsy with benign ulcerated anastomotic mucosa with granulation tissue Cycle 4 adjuvant Xeloda 07/19/2020, 2000 mg every morning and 1500 mg every afternoon Cycle 5 adjuvant Xeloda 08/09/2020 Cycle 6 adjuvant Xeloda 08/31/2019 Cycle 7 adjuvant Xeloda 09/20/2020 Cycle 8 adjuvant Xeloda 10/11/2020 CTs 03/07/2021-no evidence of recurrent disease, hepatic steatosis, slight wall thickening at the junction of the descending and horizontal duodenum Mild elevation of CEA 2023 12/01/2021-Guardant Reveal-ct DNA detected PET 12/30/2021-hypermetabolic right liver lesion, hypermetabolic area in a loop of mid small bowel with an SUV of 12.5, review of PET images at GI tumor conference 01/11/2022-small bowel uptake felt to be a benign finding MRI liver 01/10/2022-solitary 1.2 cm right liver lesion between segments 7 and 8 compatible with metastasis, subtle changes suggesting cirrhosis, hepatic steatosis CT enteroscopy 02/02/2022-small bowel loop with hypermetabolic activity on PET continues to have a bandlike density along the margin felt to represent a diverticulum or adjacent nodal tissue.  Small bowel tumor not excluded.  3-4 mm left omental nodule 02/08/2022-biopsy and ablation of solitary liver lesion, adenocarcinoma consistent with metastatic colorectal adenocarcinoma CTs 03/31/2022-unchanged circumstantial soft tissue thickening surrounding a loop of mid to distal small bowel with an adjacent nodular focus measuring 1.2 cm.,  Ablation cavity in segment 7, no other evidence of metastatic disease Diagnostic laparoscopy 05/26/2022-mid small bowel mass-resected, well to moderately differentiated adenocarcinoma, 0/2 lymph  nodes, morphology consistent with a colon primary, tumor involved  full-thickness including serosa with perineural and large vessel involvement; foundation 1-microsatellite stable, tumor mutation burden 2, K-ras Q61H CT abdomen/pelvis 07/10/2022-the right liver ablation defect is smaller, new subtle right liver lesion measuring 13 x 9 mm, enlarged lymph node in the right upper quadrant mesentery adjacent to surgical anastomosis PET 07/26/2022-new segment 6 liver lesion, enlarging hypermetabolic left omental lesion, there is another mildly hypermetabolic peritoneal implant, 7 mm omental nodule at the midline without hypermetabolism Cycle 1 FOLFOX 09/25/2022 Cycle 2 FOLFOX/bevacizumab 10/09/2022, 5-FU dose reduced secondary to mucositis Cycle 3 FOLFOX/bevacizumab 10/23/2022 Cycle 4 FOLFOX/bevacizumab 11/06/2022 Cycle 5 FOLFOX/bevacizumab 11/20/2022 Cycle 6 FOLFOX/bevacizumab 12/04/2022 CT abdomen/pelvis 12/14/2022-further contraction of the treated segment 7 lesion, slight enlargement of segment 6 lesion, no new liver lesion, increased size of ileocolic nodes and an omental lesion, stable small retroperitoneal nodes Cycle 7 FOLFOX/bevacizumab 12/18/2022 Cycle 8 FOLFOX/bevacizumab 01/02/2023, oxaliplatin dose reduced secondary to prolonged nausea and neuropathy symptoms, Emend added Cycle 9 FOLFOX/bevacizumab 01/16/2023, oxaliplatin discontinued secondary to neuropathy Cycle 1 FOLFIRI 02/19/2023, Avastin held due to recent dental extractions Cycle 2 FOLFIRI/Avastin 03/05/2023 Cycle 3 FOLFIRI/Avastin 03/19/2023, 5-FU bolus held due to mucositis after cycle 2, Fulphila added Cycle 4 FOLFIRI/Avastin 04/02/2023, Fulphila, Emend Cycle 5 FOLFIRI/Avastin 04/16/2023, Fulphila, Emend CTs 05/02/2023-similar ileocolic mesenteric adenopathy and omental metastasis, cirrhosis with portal hypertension, stable liver lesions, no new or progressive disease Cycle 6 FOLFIRI/Avastin 05/07/2023, Fulphila, Emend Cycle 7 FOLFIRI/Avastin  05/21/2023, Fulphila, Emend Cycle 8 FOLFIRI 06/04/2023, Fulphila, Emend; Avastin held Cycle 9 FOLFIRI 06/18/2023, Fulphila, Emend, Avastin held Treatment held 07/09/2023 patient request Microcytic anemia secondary to #1-improved Colon polyps-sessile serrated adenoma of the descending colon, tubular adenomas with focal high-grade dysplasia of the transverse colon on colonoscopy 02/12/2020, tubular adenoma of the mid ascending colon on the surgical specimen 04/12/2020 Family history of colon cancer Depression Anxiety/panic attacks Hypertension Hyperlipidemia Nausea secondary to Xeloda?-Resolved Gastroesophageal reflux disease-improved following discontinuation of Xeloda Anemia, microcytic; ferritin 6 01/24/2022; stool positive for blood x3 01/25/2022; 02/02/2022 CT abdomen/pelvis enterography-bowel loop demonstrating hypermetabolic activity continues to have a bandlike density along its margin possibly representing a diverticulum or adjacent nodal tissue, difficult to exclude small bowel tumor, 3 x 4 mm left omental nodule or lymph node, known right hepatic lobe tumor is occult on CT. Venofer weekly x3 beginning 03/03/2022 Negative capsule endoscopy 03/09/2022 Venofer 03/03/2022 for 3 weekly doses Venofer 05/03/2022, 05/19/2022 Venofer 07/28/2022, 08/04/2022 Venofer 03/22/2023, 03/29/2023, 04/04/2023 Venofer 06/18/2023, 07/09/2023 07/03/2023 upper endoscopy and colonoscopy-severe portal hypertensive gastropathy in the gastric fundus, gastric body, stomach, red blood found greater curvature of the stomach and gastric antrum, treated with APC; colonoscopy showed dark old maroon-colored blood in the entire colon.  No evidence of active or recent bleeding in the colon. 12.  Anterior left neck nodule 03/17/2022-cyst?,  Lymph node? 13.  Mucositis secondary to chemotherapy at office visit 10/03/2022-the 5-FU will be dose reduced with cycle 2 FOLFOX 14.  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  Oxaliplatin neuropathy-mild loss of vibratory sense 12/04/2022, 12/18/2022, 01/02/2023  Disposition: Mr. Davin appears stable.  He has completed 9 cycles of FOLFIRI plus Avastin.  Avastin was held with cycles 8 and 9 due to GI bleeding.  CEA is lower again today.  He does not feel up to resuming chemotherapy at present.  He would like to return for the next chemotherapy in 1 week.    The recent upper endoscopy showed severe portal hypertensive gastropathy.  Red blood  was found greater curvature of the stomach and in the gastric antrum.  He was treated with APC.  We reviewed the CBC.  Hemoglobin is lower today.  He agrees to proceed with IV iron.  He will return for follow-up and treatment 07/18/2023.  We are available to see him sooner if needed.  Patient seen with Dr. Truett Perna.  Anthony Calhoun ANP/GNP-BC   07/09/2023  10:13 AM  This was a shared visit with Anthony Calhoun.  We discussed the endoscopic findings with [name].  The iron deficiency anemia appears to be related to GI bleeding from portal hypertensive gastropathy.  He was treated by Dr. Marca Ancona.  He will complete a course of IV iron.  The portal hypertensive gastropathy may be related to a cirrhosis equivalent from fatty liver disease or liver toxicity from chemotherapy.  There is no clinical evidence for progression of the metastatic colon cancer.  I recommend continuing FOLFIRI.  I was present for greater than 50% of today's visit.  I performed medical decision making.  Mancel Bale, MD

## 2023-07-09 NOTE — Progress Notes (Signed)
Patient seen by Lonna Cobb NP today  Vitals are within treatment parameters:Yes   Labs are within treatment parameters: Yes   Treatment plan has been signed: Yes   Per physician team, Patient will not be receiving treatment today. Patient will received IV iron and Flu injection

## 2023-07-09 NOTE — Patient Instructions (Signed)

## 2023-07-09 NOTE — Patient Instructions (Signed)
Iron Sucrose Injection What is this medication? IRON SUCROSE (EYE ern SOO krose) treats low levels of iron (iron deficiency anemia) in people with kidney disease. Iron is a mineral that plays an important role in making red blood cells, which carry oxygen from your lungs to the rest of your body. This medicine may be used for other purposes; ask your health care provider or pharmacist if you have questions. COMMON BRAND NAME(S): Venofer What should I tell my care team before I take this medication? They need to know if you have any of these conditions: Anemia not caused by low iron levels Heart disease High levels of iron in the blood Kidney disease Liver disease An unusual or allergic reaction to iron, other medications, foods, dyes, or preservatives Pregnant or trying to get pregnant Breastfeeding How should I use this medication? This medication is for infusion into a vein. It is given in a hospital or clinic setting. Talk to your care team about the use of this medication in children. While this medication may be prescribed for children as young as 2 years for selected conditions, precautions do apply. Overdosage: If you think you have taken too much of this medicine contact a poison control center or emergency room at once. NOTE: This medicine is only for you. Do not share this medicine with others. What if I miss a dose? Keep appointments for follow-up doses. It is important not to miss your dose. Call your care team if you are unable to keep an appointment. What may interact with this medication? Do not take this medication with any of the following: Deferoxamine Dimercaprol Other iron products This medication may also interact with the following: Chloramphenicol Deferasirox This list may not describe all possible interactions. Give your health care provider a list of all the medicines, herbs, non-prescription drugs, or dietary supplements you use. Also tell them if you smoke,  drink alcohol, or use illegal drugs. Some items may interact with your medicine. What should I watch for while using this medication? Visit your care team regularly. Tell your care team if your symptoms do not start to get better or if they get worse. You may need blood work done while you are taking this medication. You may need to follow a special diet. Talk to your care team. Foods that contain iron include: whole grains/cereals, dried fruits, beans, or peas, leafy green vegetables, and organ meats (liver, kidney). What side effects may I notice from receiving this medication? Side effects that you should report to your care team as soon as possible: Allergic reactions--skin rash, itching, hives, swelling of the face, lips, tongue, or throat Low blood pressure--dizziness, feeling faint or lightheaded, blurry vision Shortness of breath Side effects that usually do not require medical attention (report to your care team if they continue or are bothersome): Flushing Headache Joint pain Muscle pain Nausea Pain, redness, or irritation at injection site This list may not describe all possible side effects. Call your doctor for medical advice about side effects. You may report side effects to FDA at 1-800-FDA-1088. Where should I keep my medication? This medication is given in a hospital or clinic. It will not be stored at home. NOTE: This sheet is a summary. It may not cover all possible information. If you have questions about this medicine, talk to your doctor, pharmacist, or health care provider.  2024 Elsevier/Gold Standard (2023-01-19 00:00:00)

## 2023-07-11 ENCOUNTER — Inpatient Hospital Stay: Payer: Medicare HMO

## 2023-07-12 ENCOUNTER — Other Ambulatory Visit: Payer: Self-pay

## 2023-07-14 ENCOUNTER — Other Ambulatory Visit: Payer: Self-pay | Admitting: Oncology

## 2023-07-17 ENCOUNTER — Encounter: Payer: Self-pay | Admitting: Oncology

## 2023-07-17 DIAGNOSIS — E1165 Type 2 diabetes mellitus with hyperglycemia: Secondary | ICD-10-CM | POA: Diagnosis not present

## 2023-07-18 ENCOUNTER — Encounter: Payer: Self-pay | Admitting: Oncology

## 2023-07-18 ENCOUNTER — Encounter: Payer: Self-pay | Admitting: Nurse Practitioner

## 2023-07-18 ENCOUNTER — Inpatient Hospital Stay: Payer: Medicare HMO

## 2023-07-18 ENCOUNTER — Other Ambulatory Visit: Payer: Self-pay | Admitting: Nurse Practitioner

## 2023-07-18 ENCOUNTER — Inpatient Hospital Stay (HOSPITAL_BASED_OUTPATIENT_CLINIC_OR_DEPARTMENT_OTHER): Payer: Medicare HMO | Admitting: Nurse Practitioner

## 2023-07-18 VITALS — BP 125/67 | HR 87 | Temp 98.1°F | Resp 18 | Ht 66.0 in | Wt 244.8 lb

## 2023-07-18 VITALS — BP 117/71 | HR 77 | Resp 18

## 2023-07-18 DIAGNOSIS — D509 Iron deficiency anemia, unspecified: Secondary | ICD-10-CM | POA: Diagnosis not present

## 2023-07-18 DIAGNOSIS — C787 Secondary malignant neoplasm of liver and intrahepatic bile duct: Secondary | ICD-10-CM | POA: Diagnosis not present

## 2023-07-18 DIAGNOSIS — C189 Malignant neoplasm of colon, unspecified: Secondary | ICD-10-CM

## 2023-07-18 DIAGNOSIS — I10 Essential (primary) hypertension: Secondary | ICD-10-CM | POA: Diagnosis not present

## 2023-07-18 DIAGNOSIS — K219 Gastro-esophageal reflux disease without esophagitis: Secondary | ICD-10-CM | POA: Diagnosis not present

## 2023-07-18 DIAGNOSIS — K648 Other hemorrhoids: Secondary | ICD-10-CM | POA: Diagnosis not present

## 2023-07-18 DIAGNOSIS — Z5111 Encounter for antineoplastic chemotherapy: Secondary | ICD-10-CM | POA: Diagnosis not present

## 2023-07-18 DIAGNOSIS — C182 Malignant neoplasm of ascending colon: Secondary | ICD-10-CM | POA: Diagnosis not present

## 2023-07-18 DIAGNOSIS — F410 Panic disorder [episodic paroxysmal anxiety] without agoraphobia: Secondary | ICD-10-CM | POA: Diagnosis not present

## 2023-07-18 DIAGNOSIS — F32A Depression, unspecified: Secondary | ICD-10-CM | POA: Diagnosis not present

## 2023-07-18 DIAGNOSIS — Z79899 Other long term (current) drug therapy: Secondary | ICD-10-CM | POA: Diagnosis not present

## 2023-07-18 DIAGNOSIS — Z23 Encounter for immunization: Secondary | ICD-10-CM | POA: Diagnosis not present

## 2023-07-18 DIAGNOSIS — E785 Hyperlipidemia, unspecified: Secondary | ICD-10-CM | POA: Diagnosis not present

## 2023-07-18 LAB — CMP (CANCER CENTER ONLY)
ALT: 25 U/L (ref 0–44)
AST: 27 U/L (ref 15–41)
Albumin: 3.8 g/dL (ref 3.5–5.0)
Alkaline Phosphatase: 138 U/L — ABNORMAL HIGH (ref 38–126)
Anion gap: 5 (ref 5–15)
BUN: 11 mg/dL (ref 6–20)
CO2: 27 mmol/L (ref 22–32)
Calcium: 8.4 mg/dL — ABNORMAL LOW (ref 8.9–10.3)
Chloride: 103 mmol/L (ref 98–111)
Creatinine: 0.86 mg/dL (ref 0.61–1.24)
GFR, Estimated: >60 mL/min (ref >60)
Glucose, Bld: 304 mg/dL — ABNORMAL HIGH (ref 70–99)
Potassium: 4.2 mmol/L (ref 3.5–5.1)
Sodium: 135 mmol/L (ref 135–145)
Total Bilirubin: 0.7 mg/dL (ref <1.2)
Total Protein: 7.2 g/dL (ref 6.5–8.1)

## 2023-07-18 LAB — CBC WITH DIFFERENTIAL (CANCER CENTER ONLY)
Abs Immature Granulocytes: 0.02 10*3/uL (ref 0.00–0.07)
Basophils Absolute: 0.1 10*3/uL (ref 0.0–0.1)
Basophils Relative: 1 %
Eosinophils Absolute: 0.1 10*3/uL (ref 0.0–0.5)
Eosinophils Relative: 4 %
HCT: 30.8 % — ABNORMAL LOW (ref 39.0–52.0)
Hemoglobin: 9.2 g/dL — ABNORMAL LOW (ref 13.0–17.0)
Immature Granulocytes: 1 %
Lymphocytes Relative: 21 %
Lymphs Abs: 0.8 10*3/uL (ref 0.7–4.0)
MCH: 24.5 pg — ABNORMAL LOW (ref 26.0–34.0)
MCHC: 29.9 g/dL — ABNORMAL LOW (ref 30.0–36.0)
MCV: 82.1 fL (ref 80.0–100.0)
Monocytes Absolute: 0.3 10*3/uL (ref 0.1–1.0)
Monocytes Relative: 8 %
Neutro Abs: 2.6 10*3/uL (ref 1.7–7.7)
Neutrophils Relative %: 65 %
Platelet Count: 142 10*3/uL — ABNORMAL LOW (ref 150–400)
RBC: 3.75 MIL/uL — ABNORMAL LOW (ref 4.22–5.81)
RDW: 18.2 % — ABNORMAL HIGH (ref 11.5–15.5)
WBC Count: 4 10*3/uL (ref 4.0–10.5)
nRBC: 0 % (ref 0.0–0.2)

## 2023-07-18 LAB — CEA (ACCESS): CEA (CHCC): 9.16 ng/mL — ABNORMAL HIGH (ref 0.00–5.00)

## 2023-07-18 LAB — TOTAL PROTEIN, URINE DIPSTICK: Protein, ur: NEGATIVE mg/dL

## 2023-07-18 MED ORDER — SODIUM CHLORIDE 0.9 % IV SOLN
200.0000 mg/m2 | Freq: Once | INTRAVENOUS | Status: AC
  Administered 2023-07-18: 444 mg via INTRAVENOUS
  Filled 2023-07-18: qty 22.2

## 2023-07-18 MED ORDER — DEXAMETHASONE SODIUM PHOSPHATE 10 MG/ML IJ SOLN
10.0000 mg | Freq: Once | INTRAMUSCULAR | Status: AC
  Administered 2023-07-18: 10 mg via INTRAVENOUS
  Filled 2023-07-18: qty 1

## 2023-07-18 MED ORDER — SODIUM CHLORIDE 0.9 % IV SOLN: Freq: Once | INTRAVENOUS | Status: AC

## 2023-07-18 MED ORDER — SODIUM CHLORIDE 0.9 % IV SOLN
180.0000 mg/m2 | Freq: Once | INTRAVENOUS | Status: AC
  Administered 2023-07-18: 400 mg via INTRAVENOUS
  Filled 2023-07-18: qty 15

## 2023-07-18 MED ORDER — LEUCOVORIN CALCIUM INJECTION 350 MG
200.0000 mg/m2 | Freq: Once | INTRAMUSCULAR | Status: DC
  Filled 2023-07-18: qty 22.2

## 2023-07-18 MED ORDER — PALONOSETRON HCL INJECTION 0.25 MG/5ML
0.2500 mg | Freq: Once | INTRAVENOUS | Status: AC
  Administered 2023-07-18: 0.25 mg via INTRAVENOUS
  Filled 2023-07-18: qty 5

## 2023-07-18 MED ORDER — ATROPINE SULFATE 1 MG/ML IV SOLN
0.5000 mg | Freq: Once | INTRAVENOUS | Status: AC | PRN
  Administered 2023-07-18: 0.5 mg via INTRAVENOUS
  Filled 2023-07-18: qty 1

## 2023-07-18 MED ORDER — SODIUM CHLORIDE 0.9 % IV SOLN
1600.0000 mg/m2 | INTRAVENOUS | Status: DC
  Administered 2023-07-18: 3500 mg via INTRAVENOUS
  Filled 2023-07-18: qty 70

## 2023-07-18 MED ORDER — SODIUM CHLORIDE 0.9 % IV SOLN
150.0000 mg | Freq: Once | INTRAVENOUS | Status: AC
  Administered 2023-07-18: 150 mg via INTRAVENOUS
  Filled 2023-07-18: qty 150

## 2023-07-18 MED ORDER — FLUOROURACIL CHEMO INJECTION 500 MG/10ML
200.0000 mg/m2 | Freq: Once | INTRAVENOUS | Status: AC
  Administered 2023-07-18: 450 mg via INTRAVENOUS
  Filled 2023-07-18: qty 9

## 2023-07-18 NOTE — Patient Instructions (Signed)
[facility] - A DEPT OF [facility]   Discharge Instructions: Thank you for choosing [facility] to provide your oncology and hematology care.   If you have a lab appointment with the Cancer Center, please go directly to the Cancer Center and check in at the registration area.   Wear comfortable clothing and clothing appropriate for easy access to any Portacath or PICC line.   We strive to give you quality time with your provider. You may need to reschedule your appointment if you arrive late (15 or more minutes).  Arriving late affects you and other patients whose appointments are after yours.  Also, if you miss three or more appointments without notifying the office, you may be dismissed from the clinic at the provider's discretion.      For prescription refill requests, have your pharmacy contact our office and allow 72 hours for refills to be completed.    Today you received the following chemotherapy and/or immunotherapy agents Irinotecan (CAMPTOSAR), Leucovorin & Flourouracil (ADRUCIL).      To help prevent nausea and vomiting after your treatment, we encourage you to take your nausea medication as directed.  BELOW ARE SYMPTOMS THAT SHOULD BE REPORTED IMMEDIATELY: *FEVER GREATER THAN 100.4 F (38 C) OR HIGHER *CHILLS OR SWEATING *NAUSEA AND VOMITING THAT IS NOT CONTROLLED WITH YOUR NAUSEA MEDICATION *UNUSUAL SHORTNESS OF BREATH *UNUSUAL BRUISING OR BLEEDING *URINARY PROBLEMS (pain or burning when urinating, or frequent urination) *BOWEL PROBLEMS (unusual diarrhea, constipation, pain near the anus) TENDERNESS IN MOUTH AND THROAT WITH OR WITHOUT PRESENCE OF ULCERS (sore throat, sores in mouth, or a toothache) UNUSUAL RASH, SWELLING OR PAIN  UNUSUAL VAGINAL DISCHARGE OR ITCHING   Items with * indicate a potential emergency and should be followed up as soon as possible or go to the Emergency Department if any problems should  occur.  Please show the CHEMOTHERAPY ALERT CARD or IMMUNOTHERAPY ALERT CARD at check-in to the Emergency Department and triage nurse.  Should you have questions after your visit or need to cancel or reschedule your appointment, please contact [facility] - [facility] DE[facility]  Dept: [phone]  and follow the prompts.  Office hours are 8:00 a.m. to 4:30 p.m. Monday - Friday. Please note that voicemails left after 4:00 p.m. may not be returned until the following business day.  We are closed weekends and major holidays. You have access to a nurse at all times for urgent questions. Please call the main number to the clinic Dept: [phone] and follow the prompts.   For any non-urgent questions, you may also contact your provider using MyChart. We now offer e-Visits for anyone 15 and older to request care online for non-urgent symptoms. For details visit [email or web address].   Also download the MyChart app! Go to the app store, search "MyChart", open the app, select [facility], and log in with your MyChart username and password.   Irinotecan Injection What is this medication? IRINOTECAN (ir in oh TEE kan) treats some types of cancer. It works by slowing down the growth of cancer cells. This medicine may be used for other purposes; ask your health care provider or pharmacist if you have questions. COMMON BRAND NAME(S): Camptosar What should I tell my care team before I take this medication? They need to know if you have any of these conditions: Dehydration Diarrhea Infection, especially a viral infection, such as chickenpox, cold sores, herpes Liver disease Low  blood cell levels (white cells, red cells, and platelets) Low levels of electrolytes, such as calcium, magnesium, or potassium in your blood Recent or ongoing radiation An unusual or allergic reaction to irinotecan, other medications, foods, dyes, or preservatives If you or your partner are  pregnant or trying to get pregnant Breast-feeding How should I use this medication? This medication is injected into a vein. It is given by your care team in a hospital or clinic setting. Talk to your care team about the use of this medication in children. Special care may be needed. Overdosage: If you think you have taken too much of this medicine contact a poison control center or emergency room at once. NOTE: This medicine is only for you. Do not share this medicine with others. What if I miss a dose? Keep appointments for follow-up doses. It is important not to miss your dose. Call your care team if you are unable to keep an appointment. What may interact with this medication? Do not take this medication with any of the following: Cobicistat Itraconazole This medication may also interact with the following: Certain antibiotics, such as clarithromycin, rifampin, rifabutin Certain antivirals for HIV or AIDS Certain medications for fungal infections, such as ketoconazole, posaconazole, voriconazole Certain medications for seizures, such as carbamazepine, phenobarbital, phenytoin Gemfibrozil Nefazodone St. John's wort This list may not describe all possible interactions. Give your health care provider a list of all the medicines, herbs, non-prescription drugs, or dietary supplements you use. Also tell them if you smoke, drink alcohol, or use illegal drugs. Some items may interact with your medicine. What should I watch for while using this medication? Your condition will be monitored carefully while you are receiving this medication. You may need blood work while taking this medication. This medication may make you feel generally unwell. This is not uncommon as chemotherapy can affect healthy cells as well as cancer cells. Report any side effects. Continue your course of treatment even though you feel ill unless your care team tells you to stop. This medication can cause serious side  effects. To reduce the risk, your care team may give you other medications to take before receiving this one. Be sure to follow the directions from your care team. This medication may affect your coordination, reaction time, or judgement. Do not drive or operate machinery until you know how this medication affects you. Sit up or stand slowly to reduce the risk of dizzy or fainting spells. Drinking alcohol with this medication can increase the risk of these side effects. This medication may increase your risk of getting an infection. Call your care team for advice if you get a fever, chills, sore throat, or other symptoms of a cold or flu. Do not treat yourself. Try to avoid being around people who are sick. Avoid taking medications that contain aspirin, acetaminophen, ibuprofen, naproxen, or ketoprofen unless instructed by your care team. These medications may hide a fever. This medication may increase your risk to bruise or bleed. Call your care team if you notice any unusual bleeding. Be careful brushing or flossing your teeth or using a toothpick because you may get an infection or bleed more easily. If you have any dental work done, tell your dentist you are receiving this medication. Talk to your care team if you or your partner are pregnant or think either of you might be pregnant. This medication can cause serious birth defects if taken during pregnancy and for 6 months after the last dose. You will  need a negative pregnancy test before starting this medication. Contraception is recommended while taking this medication and for 6 months after the last dose. Your care team can help you find the option that works for you. Do not father a child while taking this medication and for 3 months after the last dose. Use a condom for contraception during this time period. Do not breastfeed while taking this medication and for 7 days after the last dose. This medication may cause infertility. Talk to your care  team if you are concerned about your fertility. What side effects may I notice from receiving this medication? Side effects that you should report to your care team as soon as possible: Allergic reactions--skin rash, itching, hives, swelling of the face, lips, tongue, or throat Dry cough, shortness of breath or trouble breathing Increased saliva or tears, increased sweating, stomach cramping, diarrhea, small pupils, unusual weakness or fatigue, slow heartbeat Infection--fever, chills, cough, sore throat, wounds that don't heal, pain or trouble when passing urine, general feeling of discomfort or being unwell Kidney injury--decrease in the amount of urine, swelling of the ankles, hands, or feet Low red blood cell level--unusual weakness or fatigue, dizziness, headache, trouble breathing Severe or prolonged diarrhea Unusual bruising or bleeding Side effects that usually do not require medical attention (report to your care team if they continue or are bothersome): Constipation Diarrhea Hair loss Loss of appetite Nausea Stomach pain This list may not describe all possible side effects. Call your doctor for medical advice about side effects. You may report side effects to FDA at 1-800-FDA-1088. Where should I keep my medication? This medication is given in a hospital or clinic. It will not be stored at home. NOTE: This sheet is a summary. It may not cover all possible information. If you have questions about this medicine, talk to your doctor, pharmacist, or health care provider.  2024 Elsevier/Gold Standard (2021-12-26 00:00:00)  Leucovorin Injection What is this medication? LEUCOVORIN (loo koe VOR in) prevents side effects from certain medications, such as methotrexate. It works by increasing folate levels. This helps protect healthy cells in your body. It may also be used to treat anemia caused by low levels of folate. It can also be used with fluorouracil, a type of chemotherapy, to treat  colorectal cancer. It works by increasing the effects of fluorouracil in the body. This medicine may be used for other purposes; ask your health care provider or pharmacist if you have questions. What should I tell my care team before I take this medication? They need to know if you have any of these conditions: Anemia from low levels of vitamin B12 in the blood An unusual or allergic reaction to leucovorin, folic acid, other medications, foods, dyes, or preservatives Pregnant or trying to get pregnant Breastfeeding How should I use this medication? This medication is injected into a vein or a muscle. It is given by your care team in a hospital or clinic setting. Talk to your care team about the use of this medication in children. Special care may be needed. Overdosage: If you think you have taken too much of this medicine contact a poison control center or emergency room at once. NOTE: This medicine is only for you. Do not share this medicine with others. What if I miss a dose? Keep appointments for follow-up doses. It is important not to miss your dose. Call your care team if you are unable to keep an appointment. What may interact with this medication? Capecitabine Fluorouracil  Phenobarbital Phenytoin Primidone Trimethoprim;sulfamethoxazole This list may not describe all possible interactions. Give your health care provider a list of all the medicines, herbs, non-prescription drugs, or dietary supplements you use. Also tell them if you smoke, drink alcohol, or use illegal drugs. Some items may interact with your medicine. What should I watch for while using this medication? Your condition will be monitored carefully while you are receiving this medication. This medication may increase the side effects of 5-fluorouracil. Tell your care team if you have diarrhea or mouth sores that do not get better or that get worse. What side effects may I notice from receiving this medication? Side  effects that you should report to your care team as soon as possible: Allergic reactions--skin rash, itching, hives, swelling of the face, lips, tongue, or throat This list may not describe all possible side effects. Call your doctor for medical advice about side effects. You may report side effects to FDA at 1-800-FDA-1088. Where should I keep my medication? This medication is given in a hospital or clinic. It will not be stored at home. NOTE: This sheet is a summary. It may not cover all possible information. If you have questions about this medicine, talk to your doctor, pharmacist, or health care provider.  2024 Elsevier/Gold Standard (2022-01-17 00:00:00)   Fluorouracil Injection What is this medication? FLUOROURACIL (flure oh YOOR a sil) treats some types of cancer. It works by slowing down the growth of cancer cells. This medicine may be used for other purposes; ask your health care provider or pharmacist if you have questions. COMMON BRAND NAME(S): Adrucil What should I tell my care team before I take this medication? They need to know if you have any of these conditions: Blood disorders Dihydropyrimidine dehydrogenase (DPD) deficiency Infection, such as chickenpox, cold sores, herpes Kidney disease Liver disease Poor nutrition Recent or ongoing radiation therapy An unusual or allergic reaction to fluorouracil, other medications, foods, dyes, or preservatives If you or your partner are pregnant or trying to get pregnant Breast-feeding How should I use this medication? This medication is injected into a vein. It is administered by your care team in a hospital or clinic setting. Talk to your care team about the use of this medication in children. Special care may be needed. Overdosage: If you think you have taken too much of this medicine contact a poison control center or emergency room at once. NOTE: This medicine is only for you. Do not share this medicine with others. What  if I miss a dose? Keep appointments for follow-up doses. It is important not to miss your dose. Call your care team if you are unable to keep an appointment. What may interact with this medication? Do not take this medication with any of the following: Live virus vaccines This medication may also interact with the following: Medications that treat or prevent blood clots, such as warfarin, enoxaparin, dalteparin This list may not describe all possible interactions. Give your health care provider a list of all the medicines, herbs, non-prescription drugs, or dietary supplements you use. Also tell them if you smoke, drink alcohol, or use illegal drugs. Some items may interact with your medicine. What should I watch for while using this medication? Your condition will be monitored carefully while you are receiving this medication. This medication may make you feel generally unwell. This is not uncommon as chemotherapy can affect healthy cells as well as cancer cells. Report any side effects. Continue your course of treatment even though you  feel ill unless your care team tells you to stop. In some cases, you may be given additional medications to help with side effects. Follow all directions for their use. This medication may increase your risk of getting an infection. Call your care team for advice if you get a fever, chills, sore throat, or other symptoms of a cold or flu. Do not treat yourself. Try to avoid being around people who are sick. This medication may increase your risk to bruise or bleed. Call your care team if you notice any unusual bleeding. Be careful brushing or flossing your teeth or using a toothpick because you may get an infection or bleed more easily. If you have any dental work done, tell your dentist you are receiving this medication. Avoid taking medications that contain aspirin, acetaminophen, ibuprofen, naproxen, or ketoprofen unless instructed by your care team. These  medications may hide a fever. Do not treat diarrhea with over the counter products. Contact your care team if you have diarrhea that lasts more than 2 days or if it is severe and watery. This medication can make you more sensitive to the sun. Keep out of the sun. If you cannot avoid being in the sun, wear protective clothing and sunscreen. Do not use sun lamps, tanning beds, or tanning booths. Talk to your care team if you or your partner wish to become pregnant or think you might be pregnant. This medication can cause serious birth defects if taken during pregnancy and for 3 months after the last dose. A reliable form of contraception is recommended while taking this medication and for 3 months after the last dose. Talk to your care team about effective forms of contraception. Do not father a child while taking this medication and for 3 months after the last dose. Use a condom while having sex during this time period. Do not breastfeed while taking this medication. This medication may cause infertility. Talk to your care team if you are concerned about your fertility. What side effects may I notice from receiving this medication? Side effects that you should report to your care team as soon as possible: Allergic reactions--skin rash, itching, hives, swelling of the face, lips, tongue, or throat Heart attack--pain or tightness in the chest, shoulders, arms, or jaw, nausea, shortness of breath, cold or clammy skin, feeling faint or lightheaded Heart failure--shortness of breath, swelling of the ankles, feet, or hands, sudden weight gain, unusual weakness or fatigue Heart rhythm changes--fast or irregular heartbeat, dizziness, feeling faint or lightheaded, chest pain, trouble breathing High ammonia level--unusual weakness or fatigue, confusion, loss of appetite, nausea, vomiting, seizures Infection--fever, chills, cough, sore throat, wounds that don't heal, pain or trouble when passing urine, general  feeling of discomfort or being unwell Low red blood cell level--unusual weakness or fatigue, dizziness, headache, trouble breathing Pain, tingling, or numbness in the hands or feet, muscle weakness, change in vision, confusion or trouble speaking, loss of balance or coordination, trouble walking, seizures Redness, swelling, and blistering of the skin over hands and feet Severe or prolonged diarrhea Unusual bruising or bleeding Side effects that usually do not require medical attention (report to your care team if they continue or are bothersome): Dry skin Headache Increased tears Nausea Pain, redness, or swelling with sores inside the mouth or throat Sensitivity to light Vomiting This list may not describe all possible side effects. Call your doctor for medical advice about side effects. You may report side effects to FDA at 1-800-FDA-1088. Where should I keep  my medication? This medication is given in a hospital or clinic. It will not be stored at home. NOTE: This sheet is a summary. It may not cover all possible information. If you have questions about this medicine, talk to your doctor, pharmacist, or health care provider.  2024 Elsevier/Gold Standard (2021-12-20 00:00:00)   The chemotherapy medication bag should finish at 46 hours, 96 hours, or 7 days. For example, if your pump is scheduled for 46 hours and it was put on at 4:00 p.m., it should finish at 2:00 p.m. the day it is scheduled to come off regardless of your appointment time.     Estimated time to finish at 12:00 p.m. on Friday 07/20/2023.   If the display on your pump reads "Low Volume" and it is beeping, take the batteries out of the pump and come to the cancer center for it to be taken off.   If the pump alarms go off prior to the pump reading "Low Volume" then call [phone] and someone can assist you.  If the plunger comes out and the chemotherapy medication is leaking out, please use your home chemo spill kit to  clean up the spill. Do NOT use paper towels or other household products.  If you have problems or questions regarding your pump, please call either [phone] (24 hours a day) or the cancer center Monday-Friday 8:00 a.m.- 4:30 p.m. at the clinic number and we will assist you. If you are unable to get assistance, then go to the nearest Emergency Department and ask the staff to contact the IV team for assistance.

## 2023-07-18 NOTE — Progress Notes (Signed)
Patient seen by Lonna Cobb NP today  Vitals are within treatment parameters:Yes   Labs are within treatment parameters: Yes   Treatment plan has been signed: Yes   Per physician team, Patient is ready for treatment. Please note the following modifications:holding avastin

## 2023-07-18 NOTE — Progress Notes (Signed)
[facility] OFFICE PROGRESS NOTE   Diagnosis: Colon cancer  INTERVAL HISTORY:   Anthony Calhoun returns as scheduled.  He continues to have intermittent nausea.  He tends to notice the nausea when he is "hungry".  He has intermittent abdominal discomfort.  No mouth sores.  No diarrhea.  No black stools.  No known bleeding.  He has a "toothache".  Objective:  Vital signs in last 24 hours:  Blood pressure 125/67, pulse 87, temperature 98.1 F (36.7 C), resp. rate 18, height 5\' 6"  (1.676 m), weight 244 lb 12.8 oz (111 kg), SpO2 96%.    HEENT: No thrush or ulcers. Resp: Lungs clear bilaterally. Cardio: Regular rate and rhythm. GI: No hepatosplenomegaly. Vascular: No leg edema. Skin: Palms without erythema. Port-A-Cath without erythema.  Lab Results:  Lab Results  Component Value Date   WBC 4.0 07/18/2023   HGB 9.2 (L) 07/18/2023   HCT 30.8 (L) 07/18/2023   MCV 82.1 07/18/2023   PLT 142 (L) 07/18/2023   NEUTROABS 2.6 07/18/2023    Imaging:  No results found.  Medications: I have reviewed the patient's current medications.  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 secondary to nausea (patient discontinued Xeloda on day 11 due to colonoscopy) Colonoscopy 07/09/2020-nodular mucosa at the colonic anastomosis, biopsied; patent end-to-side ileocolonic anastomosis characterized by healthy-appearing mucosa and visible sutures; nonbleeding internal hemorrhoids, biopsy with benign  ulcerated anastomotic mucosa with granulation tissue Cycle 4 adjuvant Xeloda 07/19/2020, 2000 mg every morning and 1500 mg every afternoon Cycle 5 adjuvant Xeloda 08/09/2020 Cycle 6 adjuvant Xeloda 08/31/2019 Cycle 7 adjuvant Xeloda 09/20/2020 Cycle 8 adjuvant Xeloda 10/11/2020 CTs 03/07/2021-no evidence of recurrent disease, hepatic steatosis, slight wall thickening at the junction of the descending and horizontal duodenum Mild elevation of CEA 2023 12/01/2021-Guardant Reveal-ct DNA detected PET 12/30/2021-hypermetabolic right liver lesion, hypermetabolic area in a loop of mid small bowel with an SUV of 12.5, review of PET images at GI tumor conference 01/11/2022-small bowel uptake felt to be a benign finding MRI liver 01/10/2022-solitary 1.2 cm right liver lesion between segments 7 and 8 compatible with metastasis, subtle changes suggesting cirrhosis, hepatic steatosis CT enteroscopy 02/02/2022-small bowel loop with hypermetabolic activity on PET continues to have a bandlike density along the margin felt to represent a diverticulum or adjacent nodal tissue.  Small bowel tumor not excluded.  3-4 mm left omental nodule 02/08/2022-biopsy and ablation of solitary liver lesion, adenocarcinoma consistent with metastatic colorectal adenocarcinoma CTs 03/31/2022-unchanged circumstantial soft tissue thickening surrounding a loop of mid to distal small bowel with an adjacent nodular focus measuring 1.2 cm.,  Ablation cavity in segment 7, no other evidence of metastatic disease Diagnostic laparoscopy 05/26/2022-mid small bowel mass-resected, well to moderately differentiated adenocarcinoma, 0/2 lymph nodes, morphology consistent with a colon primary, tumor involved full-thickness including serosa with perineural and large vessel involvement; foundation 1-microsatellite stable, tumor mutation burden 2, K-ras Q61H CT abdomen/pelvis 07/10/2022-the right liver ablation defect is smaller, new subtle right liver lesion measuring 13  x 9 mm, enlarged lymph node in the right upper quadrant mesentery adjacent to surgical anastomosis PET 07/26/2022-new segment  6 liver lesion, enlarging hypermetabolic left omental lesion, there is another mildly hypermetabolic peritoneal implant, 7 mm omental nodule at the midline without hypermetabolism Cycle 1 FOLFOX 09/25/2022 Cycle 2 FOLFOX/bevacizumab 10/09/2022, 5-FU dose reduced secondary to mucositis Cycle 3 FOLFOX/bevacizumab 10/23/2022 Cycle 4 FOLFOX/bevacizumab 11/06/2022 Cycle 5 FOLFOX/bevacizumab 11/20/2022 Cycle 6 FOLFOX/bevacizumab 12/04/2022 CT abdomen/pelvis 12/14/2022-further contraction of the treated segment 7 lesion, slight enlargement of segment 6 lesion, no new liver lesion, increased size of ileocolic nodes and an omental lesion, stable small retroperitoneal nodes Cycle 7 FOLFOX/bevacizumab 12/18/2022 Cycle 8 FOLFOX/bevacizumab 01/02/2023, oxaliplatin dose reduced secondary to prolonged nausea and neuropathy symptoms, Emend added Cycle 9 FOLFOX/bevacizumab 01/16/2023, oxaliplatin discontinued secondary to neuropathy Cycle 1 FOLFIRI 02/19/2023, Avastin held due to recent dental extractions Cycle 2 FOLFIRI/Avastin 03/05/2023 Cycle 3 FOLFIRI/Avastin 03/19/2023, 5-FU bolus held due to mucositis after cycle 2, Fulphila added Cycle 4 FOLFIRI/Avastin 04/02/2023, Fulphila, Emend Cycle 5 FOLFIRI/Avastin 04/16/2023, Fulphila, Emend CTs 05/02/2023-similar ileocolic mesenteric adenopathy and omental metastasis, cirrhosis with portal hypertension, stable liver lesions, no new or progressive disease Cycle 6 FOLFIRI/Avastin 05/07/2023, Fulphila, Emend Cycle 7 FOLFIRI/Avastin 05/21/2023, Fulphila, Emend Cycle 8 FOLFIRI 06/04/2023, Fulphila, Emend; Avastin held Cycle 9 FOLFIRI 06/18/2023, Fulphila, Emend, Avastin held Treatment held 07/09/2023 patient request Cycle 10 FOLFIRI 07/18/2023, Fulphila, Emend, Avastin held Microcytic anemia secondary to #1-improved Colon polyps-sessile serrated adenoma of the  descending colon, tubular adenomas with focal high-grade dysplasia of the transverse colon on colonoscopy 02/12/2020, tubular adenoma of the mid ascending colon on the surgical specimen 04/12/2020 Family history of colon cancer Depression Anxiety/panic attacks Hypertension Hyperlipidemia Nausea secondary to Xeloda?-Resolved Gastroesophageal reflux disease-improved following discontinuation of Xeloda Anemia, microcytic; ferritin 6 01/24/2022; stool positive for blood x3 01/25/2022; 02/02/2022 CT abdomen/pelvis enterography-bowel loop demonstrating hypermetabolic activity continues to have a bandlike density along its margin possibly representing a diverticulum or adjacent nodal tissue, difficult to exclude small bowel tumor, 3 x 4 mm left omental nodule or lymph node, known right hepatic lobe tumor is occult on CT. Venofer weekly x3 beginning 03/03/2022 Negative capsule endoscopy 03/09/2022 Venofer 03/03/2022 for 3 weekly doses Venofer 05/03/2022, 05/19/2022 Venofer 07/28/2022, 08/04/2022 Venofer 03/22/2023, 03/29/2023, 04/04/2023 Venofer 06/18/2023, 07/09/2023 07/03/2023 upper endoscopy and colonoscopy-severe portal hypertensive gastropathy in the gastric fundus, gastric body, stomach, red blood found greater curvature of the stomach and gastric antrum, treated with APC; colonoscopy showed dark old maroon-colored blood in the entire colon.  No evidence of active or recent bleeding in the colon. 12.  Anterior left neck nodule 03/17/2022-cyst?,  Lymph node? 13.  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  Oxaliplatin neuropathy-mild loss of vibratory sense 12/04/2022, 12/18/2022, 01/02/2023  Disposition: Mr. Tache appears stable.  He has completed 9 cycles of FOLFIRI.  Avastin held last 2 cycles due to  GI bleeding.  Most recent CEA was further improved.  There is no clinical evidence of disease progression.  Plan to proceed with another cycle of FOLFIRI today.  We will continue to hold Avastin due to his complaint of a toothache/possible need for extraction.  He plans to contact his dentist for an appointment.  Overall plan is for restaging CTs late December/early January.  CBC and chemistry panel reviewed.  Labs adequate to proceed as above.  We discussed the elevated blood sugar (304).  He will monitor closely over the next several days, limit  concentrated sweets.  He will return for follow-up and treatment in 2 weeks.  We are available to see him sooner if needed.  Patient seen with Dr. Truett Perna.  Lonna Cobb ANP/GNP-BC   07/18/2023  8:24 AM This was a shared visit with Lonna Cobb.  Mr. Moron will resume systemic therapy with FOLFIRI.  Avastin will remain on hold due to the potential need for a dental extraction.  We will contact Dr. Maryruth Hancock regarding eligibility for an immunotherapy trial at [facility].  Mancel Bale, MD

## 2023-07-19 ENCOUNTER — Other Ambulatory Visit: Payer: Self-pay

## 2023-07-19 ENCOUNTER — Encounter: Payer: Self-pay | Admitting: Oncology

## 2023-07-19 NOTE — Telephone Encounter (Signed)
Telephone call  

## 2023-07-20 ENCOUNTER — Inpatient Hospital Stay: Payer: Medicare HMO

## 2023-07-20 VITALS — BP 125/74 | HR 73 | Temp 98.5°F | Resp 18

## 2023-07-20 DIAGNOSIS — C182 Malignant neoplasm of ascending colon: Secondary | ICD-10-CM | POA: Diagnosis not present

## 2023-07-20 DIAGNOSIS — I10 Essential (primary) hypertension: Secondary | ICD-10-CM | POA: Diagnosis not present

## 2023-07-20 DIAGNOSIS — E785 Hyperlipidemia, unspecified: Secondary | ICD-10-CM | POA: Diagnosis not present

## 2023-07-20 DIAGNOSIS — F32A Depression, unspecified: Secondary | ICD-10-CM | POA: Diagnosis not present

## 2023-07-20 DIAGNOSIS — K648 Other hemorrhoids: Secondary | ICD-10-CM | POA: Diagnosis not present

## 2023-07-20 DIAGNOSIS — Z5111 Encounter for antineoplastic chemotherapy: Secondary | ICD-10-CM | POA: Diagnosis not present

## 2023-07-20 DIAGNOSIS — D509 Iron deficiency anemia, unspecified: Secondary | ICD-10-CM

## 2023-07-20 DIAGNOSIS — C189 Malignant neoplasm of colon, unspecified: Secondary | ICD-10-CM

## 2023-07-20 DIAGNOSIS — Z79899 Other long term (current) drug therapy: Secondary | ICD-10-CM | POA: Diagnosis not present

## 2023-07-20 DIAGNOSIS — F410 Panic disorder [episodic paroxysmal anxiety] without agoraphobia: Secondary | ICD-10-CM | POA: Diagnosis not present

## 2023-07-20 DIAGNOSIS — Z23 Encounter for immunization: Secondary | ICD-10-CM | POA: Diagnosis not present

## 2023-07-20 DIAGNOSIS — K219 Gastro-esophageal reflux disease without esophagitis: Secondary | ICD-10-CM | POA: Diagnosis not present

## 2023-07-20 MED ORDER — SODIUM CHLORIDE 0.9 % IV SOLN: INTRAVENOUS | Status: DC

## 2023-07-20 MED ORDER — SODIUM CHLORIDE 0.9 % IV SOLN
300.0000 mg | Freq: Once | INTRAVENOUS | Status: AC
  Administered 2023-07-20: 300 mg via INTRAVENOUS
  Filled 2023-07-20: qty 200

## 2023-07-20 MED ORDER — PEGFILGRASTIM-JMDB 6 MG/0.6ML ~~LOC~~ SOSY
6.0000 mg | PREFILLED_SYRINGE | Freq: Once | SUBCUTANEOUS | Status: AC
  Administered 2023-07-20: 6 mg via SUBCUTANEOUS
  Filled 2023-07-20: qty 0.6

## 2023-07-20 MED ORDER — HEPARIN SOD (PORK) LOCK FLUSH 100 UNIT/ML IV SOLN
500.0000 [IU] | Freq: Once | INTRAVENOUS | Status: AC | PRN
  Administered 2023-07-20: 500 [IU]

## 2023-07-20 MED ORDER — SODIUM CHLORIDE 0.9% FLUSH
10.0000 mL | INTRAVENOUS | Status: DC | PRN
  Administered 2023-07-20: 10 mL

## 2023-07-20 NOTE — Patient Instructions (Signed)
Iron Sucrose Injection What is this medication? IRON SUCROSE (EYE ern SOO krose) treats low levels of iron (iron deficiency anemia) in people with kidney disease. Iron is a mineral that plays an important role in making red blood cells, which carry oxygen from your lungs to the rest of your body. This medicine may be used for other purposes; ask your health care provider or pharmacist if you have questions. COMMON BRAND NAME(S): Venofer What should I tell my care team before I take this medication? They need to know if you have any of these conditions: Anemia not caused by low iron levels Heart disease High levels of iron in the blood Kidney disease Liver disease An unusual or allergic reaction to iron, other medications, foods, dyes, or preservatives Pregnant or trying to get pregnant Breastfeeding How should I use this medication? This medication is for infusion into a vein. It is given in a hospital or clinic setting. Talk to your care team about the use of this medication in children. While this medication may be prescribed for children as young as 2 years for selected conditions, precautions do apply. Overdosage: If you think you have taken too much of this medicine contact a poison control center or emergency room at once. NOTE: This medicine is only for you. Do not share this medicine with others. What if I miss a dose? Keep appointments for follow-up doses. It is important not to miss your dose. Call your care team if you are unable to keep an appointment. What may interact with this medication? Do not take this medication with any of the following: Deferoxamine Dimercaprol Other iron products This medication may also interact with the following: Chloramphenicol Deferasirox This list may not describe all possible interactions. Give your health care provider a list of all the medicines, herbs, non-prescription drugs, or dietary supplements you use. Also tell them if you smoke,  drink alcohol, or use illegal drugs. Some items may interact with your medicine. What should I watch for while using this medication? Visit your care team regularly. Tell your care team if your symptoms do not start to get better or if they get worse. You may need blood work done while you are taking this medication. You may need to follow a special diet. Talk to your care team. Foods that contain iron include: whole grains/cereals, dried fruits, beans, or peas, leafy green vegetables, and organ meats (liver, kidney). What side effects may I notice from receiving this medication? Side effects that you should report to your care team as soon as possible: Allergic reactions--skin rash, itching, hives, swelling of the face, lips, tongue, or throat Low blood pressure--dizziness, feeling faint or lightheaded, blurry vision Shortness of breath Side effects that usually do not require medical attention (report to your care team if they continue or are bothersome): Flushing Headache Joint pain Muscle pain Nausea Pain, redness, or irritation at injection site This list may not describe all possible side effects. Call your doctor for medical advice about side effects. You may report side effects to FDA at 1-800-FDA-1088. Where should I keep my medication? This medication is given in a hospital or clinic. It will not be stored at home. NOTE: This sheet is a summary. It may not cover all possible information. If you have questions about this medicine, talk to your doctor, pharmacist, or health care provider.  2024 Elsevier/Gold Standard (2023-01-19 00:00:00)   Pegfilgrastim Injection What is this medication? PEGFILGRASTIM (PEG fil gra stim) lowers the risk of infection  in people who are receiving chemotherapy. It works by Systems analyst make more white blood cells, which protects your body from infection. It may also be used to help people who have been exposed to high doses of radiation. This  medicine may be used for other purposes; ask your health care provider or pharmacist if you have questions. COMMON BRAND NAME(S): Cherly Hensen, Neulasta, Nyvepria, Stimufend, UDENYCA, UDENYCA ONBODY, Ziextenzo What should I tell my care team before I take this medication? They need to know if you have any of these conditions: Kidney disease Latex allergy Ongoing radiation therapy Sickle cell disease Skin reactions to acrylic adhesives (On-Body Injector only) An unusual or allergic reaction to pegfilgrastim, filgrastim, other medications, foods, dyes, or preservatives Pregnant or trying to get pregnant Breast-feeding How should I use this medication? This medication is for injection under the skin. If you get this medication at home, you will be taught how to prepare and give the pre-filled syringe or how to use the On-body Injector. Refer to the patient Instructions for Use for detailed instructions. Use exactly as directed. Tell your care team immediately if you suspect that the On-body Injector may not have performed as intended or if you suspect the use of the On-body Injector resulted in a missed or partial dose. It is important that you put your used needles and syringes in a special sharps container. Do not put them in a trash can. If you do not have a sharps container, call your pharmacist or care team to get one. Talk to your care team about the use of this medication in children. While this medication may be prescribed for selected conditions, precautions do apply. Overdosage: If you think you have taken too much of this medicine contact a poison control center or emergency room at once. NOTE: This medicine is only for you. Do not share this medicine with others. What if I miss a dose? It is important not to miss your dose. Call your care team if you miss your dose. If you miss a dose due to an On-body Injector failure or leakage, a new dose should be administered as soon as  possible using a single prefilled syringe for manual use. What may interact with this medication? Interactions have not been studied. This list may not describe all possible interactions. Give your health care provider a list of all the medicines, herbs, non-prescription drugs, or dietary supplements you use. Also tell them if you smoke, drink alcohol, or use illegal drugs. Some items may interact with your medicine. What should I watch for while using this medication? Your condition will be monitored carefully while you are receiving this medication. You may need blood work done while you are taking this medication. Talk to your care team about your risk of cancer. You may be more at risk for certain types of cancer if you take this medication. If you are going to need a MRI, CT scan, or other procedure, tell your care team that you are using this medication (On-Body Injector only). What side effects may I notice from receiving this medication? Side effects that you should report to your care team as soon as possible: Allergic reactions--skin rash, itching, hives, swelling of the face, lips, tongue, or throat Capillary leak syndrome--stomach or muscle pain, unusual weakness or fatigue, feeling faint or lightheaded, decrease in the amount of urine, swelling of the ankles, hands, or feet, trouble breathing High white blood cell level--fever, fatigue, trouble breathing, night sweats, change in  vision, weight loss Inflammation of the aorta--fever, fatigue, back, chest, or stomach pain, severe headache Kidney injury (glomerulonephritis)--decrease in the amount of urine, red or dark brown urine, foamy or bubbly urine, swelling of the ankles, hands, or feet Shortness of breath or trouble breathing Spleen injury--pain in upper left stomach or shoulder Unusual bruising or bleeding Side effects that usually do not require medical attention (report to your care team if they continue or are  bothersome): Bone pain Pain in the hands or feet This list may not describe all possible side effects. Call your doctor for medical advice about side effects. You may report side effects to FDA at 1-800-FDA-1088. Where should I keep my medication? Keep out of the reach of children. If you are using this medication at home, you will be instructed on how to store it. Throw away any unused medication after the expiration date on the label. NOTE: This sheet is a summary. It may not cover all possible information. If you have questions about this medicine, talk to your doctor, pharmacist, or health care provider.  2024 Elsevier/Gold Standard (2021-07-15 00:00:00)

## 2023-07-21 ENCOUNTER — Other Ambulatory Visit: Payer: Self-pay

## 2023-07-23 ENCOUNTER — Other Ambulatory Visit: Payer: Medicare HMO

## 2023-07-23 ENCOUNTER — Ambulatory Visit: Payer: Medicare HMO

## 2023-07-23 ENCOUNTER — Ambulatory Visit: Payer: Medicare HMO | Admitting: Oncology

## 2023-07-23 DIAGNOSIS — K219 Gastro-esophageal reflux disease without esophagitis: Secondary | ICD-10-CM | POA: Diagnosis not present

## 2023-07-23 DIAGNOSIS — Z87442 Personal history of urinary calculi: Secondary | ICD-10-CM | POA: Diagnosis not present

## 2023-07-23 DIAGNOSIS — C182 Malignant neoplasm of ascending colon: Secondary | ICD-10-CM | POA: Diagnosis not present

## 2023-07-23 DIAGNOSIS — G4700 Insomnia, unspecified: Secondary | ICD-10-CM | POA: Diagnosis not present

## 2023-07-23 DIAGNOSIS — E782 Mixed hyperlipidemia: Secondary | ICD-10-CM | POA: Diagnosis not present

## 2023-07-23 DIAGNOSIS — D696 Thrombocytopenia, unspecified: Secondary | ICD-10-CM | POA: Diagnosis not present

## 2023-07-23 DIAGNOSIS — F419 Anxiety disorder, unspecified: Secondary | ICD-10-CM | POA: Diagnosis not present

## 2023-07-23 DIAGNOSIS — I700 Atherosclerosis of aorta: Secondary | ICD-10-CM | POA: Diagnosis not present

## 2023-07-23 DIAGNOSIS — C787 Secondary malignant neoplasm of liver and intrahepatic bile duct: Secondary | ICD-10-CM | POA: Diagnosis not present

## 2023-07-23 DIAGNOSIS — E119 Type 2 diabetes mellitus without complications: Secondary | ICD-10-CM | POA: Diagnosis not present

## 2023-07-23 DIAGNOSIS — I10 Essential (primary) hypertension: Secondary | ICD-10-CM | POA: Diagnosis not present

## 2023-07-24 ENCOUNTER — Encounter: Payer: Self-pay | Admitting: Oncology

## 2023-07-25 ENCOUNTER — Other Ambulatory Visit: Payer: Self-pay | Admitting: *Deleted

## 2023-07-25 MED ORDER — MAGIC MOUTHWASH: 5.0000 mL | Freq: Four times a day (QID) | ORAL | 1 refills | Status: DC | PRN

## 2023-07-25 NOTE — Telephone Encounter (Signed)
Requesting refill on MMW and that insurance needs to have MD specify maximum dose per 24 hours on script.

## 2023-07-27 ENCOUNTER — Encounter: Payer: Self-pay | Admitting: Oncology

## 2023-07-27 ENCOUNTER — Other Ambulatory Visit: Payer: Self-pay | Admitting: Oncology

## 2023-07-27 DIAGNOSIS — C182 Malignant neoplasm of ascending colon: Secondary | ICD-10-CM | POA: Diagnosis not present

## 2023-07-30 ENCOUNTER — Encounter: Payer: Self-pay | Admitting: *Deleted

## 2023-07-30 ENCOUNTER — Other Ambulatory Visit: Payer: Self-pay | Admitting: *Deleted

## 2023-07-30 DIAGNOSIS — C787 Secondary malignant neoplasm of liver and intrahepatic bile duct: Secondary | ICD-10-CM

## 2023-07-31 ENCOUNTER — Inpatient Hospital Stay: Payer: Medicare HMO

## 2023-07-31 ENCOUNTER — Encounter: Payer: Self-pay | Admitting: Oncology

## 2023-07-31 ENCOUNTER — Ambulatory Visit (HOSPITAL_BASED_OUTPATIENT_CLINIC_OR_DEPARTMENT_OTHER)
Admission: RE | Admit: 2023-07-31 | Discharge: 2023-07-31 | Disposition: A | Discharge status: Home / Self Care | Payer: Medicare HMO | Source: Ambulatory Visit | Attending: Oncology | Admitting: Oncology

## 2023-07-31 ENCOUNTER — Inpatient Hospital Stay: Payer: Medicare HMO | Attending: Oncology

## 2023-07-31 DIAGNOSIS — F32A Depression, unspecified: Secondary | ICD-10-CM | POA: Insufficient documentation

## 2023-07-31 DIAGNOSIS — E785 Hyperlipidemia, unspecified: Secondary | ICD-10-CM | POA: Insufficient documentation

## 2023-07-31 DIAGNOSIS — I10 Essential (primary) hypertension: Secondary | ICD-10-CM | POA: Insufficient documentation

## 2023-07-31 DIAGNOSIS — C787 Secondary malignant neoplasm of liver and intrahepatic bile duct: Secondary | ICD-10-CM | POA: Diagnosis not present

## 2023-07-31 DIAGNOSIS — K7469 Other cirrhosis of liver: Secondary | ICD-10-CM | POA: Diagnosis not present

## 2023-07-31 DIAGNOSIS — D509 Iron deficiency anemia, unspecified: Secondary | ICD-10-CM | POA: Insufficient documentation

## 2023-07-31 DIAGNOSIS — Z5111 Encounter for antineoplastic chemotherapy: Secondary | ICD-10-CM | POA: Insufficient documentation

## 2023-07-31 DIAGNOSIS — C189 Malignant neoplasm of colon, unspecified: Secondary | ICD-10-CM | POA: Diagnosis not present

## 2023-07-31 DIAGNOSIS — F410 Panic disorder [episodic paroxysmal anxiety] without agoraphobia: Secondary | ICD-10-CM | POA: Insufficient documentation

## 2023-07-31 DIAGNOSIS — K766 Portal hypertension: Secondary | ICD-10-CM | POA: Diagnosis not present

## 2023-07-31 DIAGNOSIS — Z79899 Other long term (current) drug therapy: Secondary | ICD-10-CM | POA: Insufficient documentation

## 2023-07-31 DIAGNOSIS — K219 Gastro-esophageal reflux disease without esophagitis: Secondary | ICD-10-CM | POA: Insufficient documentation

## 2023-07-31 DIAGNOSIS — K3189 Other diseases of stomach and duodenum: Secondary | ICD-10-CM | POA: Insufficient documentation

## 2023-07-31 DIAGNOSIS — C182 Malignant neoplasm of ascending colon: Secondary | ICD-10-CM | POA: Insufficient documentation

## 2023-07-31 LAB — CMP (CANCER CENTER ONLY)
ALT: 21 U/L (ref 0–44)
AST: 23 U/L (ref 15–41)
Albumin: 4.1 g/dL (ref 3.5–5.0)
Alkaline Phosphatase: 122 U/L (ref 38–126)
Anion gap: 7 (ref 5–15)
BUN: 10 mg/dL (ref 6–20)
CO2: 28 mmol/L (ref 22–32)
Calcium: 9 mg/dL (ref 8.9–10.3)
Chloride: 100 mmol/L (ref 98–111)
Creatinine: 0.84 mg/dL (ref 0.61–1.24)
GFR, Estimated: >60 mL/min (ref >60)
Glucose, Bld: 323 mg/dL — ABNORMAL HIGH (ref 70–99)
Potassium: 3.8 mmol/L (ref 3.5–5.1)
Sodium: 135 mmol/L (ref 135–145)
Total Bilirubin: 0.8 mg/dL (ref <1.2)
Total Protein: 7.2 g/dL (ref 6.5–8.1)

## 2023-07-31 LAB — CBC WITH DIFFERENTIAL (CANCER CENTER ONLY)
Abs Immature Granulocytes: 0.04 10*3/uL (ref 0.00–0.07)
Basophils Absolute: 0 10*3/uL (ref 0.0–0.1)
Basophils Relative: 1 %
Eosinophils Absolute: 0.1 10*3/uL (ref 0.0–0.5)
Eosinophils Relative: 3 %
HCT: 32.5 % — ABNORMAL LOW (ref 39.0–52.0)
Hemoglobin: 9.5 g/dL — ABNORMAL LOW (ref 13.0–17.0)
Immature Granulocytes: 1 %
Lymphocytes Relative: 20 %
Lymphs Abs: 0.9 10*3/uL (ref 0.7–4.0)
MCH: 24.2 pg — ABNORMAL LOW (ref 26.0–34.0)
MCHC: 29.2 g/dL — ABNORMAL LOW (ref 30.0–36.0)
MCV: 82.7 fL (ref 80.0–100.0)
Monocytes Absolute: 0.2 10*3/uL (ref 0.1–1.0)
Monocytes Relative: 5 %
Neutro Abs: 3.2 10*3/uL (ref 1.7–7.7)
Neutrophils Relative %: 70 %
Platelet Count: 137 10*3/uL — ABNORMAL LOW (ref 150–400)
RBC: 3.93 MIL/uL — ABNORMAL LOW (ref 4.22–5.81)
RDW: 20 % — ABNORMAL HIGH (ref 11.5–15.5)
WBC Count: 4.5 10*3/uL (ref 4.0–10.5)
nRBC: 0 % (ref 0.0–0.2)

## 2023-07-31 LAB — TOTAL PROTEIN, URINE DIPSTICK

## 2023-07-31 LAB — CEA (ACCESS): CEA (CHCC): 7.85 ng/mL — ABNORMAL HIGH (ref 0.00–5.00)

## 2023-07-31 MED ORDER — IOHEXOL 300 MG/ML  SOLN
100.0000 mL | Freq: Once | INTRAMUSCULAR | Status: AC | PRN
  Administered 2023-07-31: 100 mL via INTRAVENOUS

## 2023-07-31 MED ORDER — HEPARIN SOD (PORK) LOCK FLUSH 100 UNIT/ML IV SOLN
500.0000 [IU] | Freq: Once | INTRAVENOUS | Status: AC
  Administered 2023-07-31: 500 [IU] via INTRAVENOUS

## 2023-08-01 ENCOUNTER — Inpatient Hospital Stay: Payer: Medicare HMO

## 2023-08-01 ENCOUNTER — Inpatient Hospital Stay: Payer: Medicare HMO | Admitting: Nutrition

## 2023-08-01 ENCOUNTER — Inpatient Hospital Stay (HOSPITAL_BASED_OUTPATIENT_CLINIC_OR_DEPARTMENT_OTHER): Payer: Medicare HMO | Admitting: Oncology

## 2023-08-01 VITALS — BP 117/61 | HR 86 | Temp 98.1°F | Resp 18 | Ht 66.0 in | Wt 243.7 lb

## 2023-08-01 VITALS — BP 123/66 | HR 74 | Temp 97.1°F | Resp 18

## 2023-08-01 DIAGNOSIS — C182 Malignant neoplasm of ascending colon: Secondary | ICD-10-CM | POA: Diagnosis not present

## 2023-08-01 DIAGNOSIS — D509 Iron deficiency anemia, unspecified: Secondary | ICD-10-CM | POA: Diagnosis not present

## 2023-08-01 DIAGNOSIS — Z5111 Encounter for antineoplastic chemotherapy: Secondary | ICD-10-CM | POA: Diagnosis not present

## 2023-08-01 DIAGNOSIS — F410 Panic disorder [episodic paroxysmal anxiety] without agoraphobia: Secondary | ICD-10-CM | POA: Diagnosis not present

## 2023-08-01 DIAGNOSIS — K219 Gastro-esophageal reflux disease without esophagitis: Secondary | ICD-10-CM | POA: Diagnosis not present

## 2023-08-01 DIAGNOSIS — C787 Secondary malignant neoplasm of liver and intrahepatic bile duct: Secondary | ICD-10-CM

## 2023-08-01 DIAGNOSIS — E785 Hyperlipidemia, unspecified: Secondary | ICD-10-CM | POA: Diagnosis not present

## 2023-08-01 DIAGNOSIS — Z79899 Other long term (current) drug therapy: Secondary | ICD-10-CM | POA: Diagnosis not present

## 2023-08-01 DIAGNOSIS — C189 Malignant neoplasm of colon, unspecified: Secondary | ICD-10-CM

## 2023-08-01 DIAGNOSIS — I10 Essential (primary) hypertension: Secondary | ICD-10-CM | POA: Diagnosis not present

## 2023-08-01 DIAGNOSIS — F32A Depression, unspecified: Secondary | ICD-10-CM | POA: Diagnosis not present

## 2023-08-01 DIAGNOSIS — K3189 Other diseases of stomach and duodenum: Secondary | ICD-10-CM | POA: Diagnosis not present

## 2023-08-01 MED ORDER — FLUOROURACIL CHEMO INJECTION 500 MG/10ML
200.0000 mg/m2 | Freq: Once | INTRAVENOUS | Status: AC
  Administered 2023-08-01: 450 mg via INTRAVENOUS
  Filled 2023-08-01: qty 9

## 2023-08-01 MED ORDER — SODIUM CHLORIDE 0.9 % IV SOLN: Freq: Once | INTRAVENOUS | Status: AC

## 2023-08-01 MED ORDER — DEXAMETHASONE SODIUM PHOSPHATE 10 MG/ML IJ SOLN
10.0000 mg | Freq: Once | INTRAMUSCULAR | Status: AC
  Administered 2023-08-01: 10 mg via INTRAVENOUS
  Filled 2023-08-01: qty 1

## 2023-08-01 MED ORDER — SODIUM CHLORIDE 0.9 % IV SOLN
5.0000 mg/kg | Freq: Once | INTRAVENOUS | Status: AC
  Administered 2023-08-01: 500 mg via INTRAVENOUS
  Filled 2023-08-01: qty 16

## 2023-08-01 MED ORDER — SODIUM CHLORIDE 0.9 % IV SOLN
1600.0000 mg/m2 | INTRAVENOUS | Status: DC
  Administered 2023-08-01: 3500 mg via INTRAVENOUS
  Filled 2023-08-01: qty 70

## 2023-08-01 MED ORDER — SODIUM CHLORIDE 0.9 % IV SOLN
200.0000 mg/m2 | Freq: Once | INTRAVENOUS | Status: AC
  Administered 2023-08-01: 444 mg via INTRAVENOUS
  Filled 2023-08-01: qty 22.2

## 2023-08-01 MED ORDER — SODIUM CHLORIDE 0.9 % IV SOLN
150.0000 mg | Freq: Once | INTRAVENOUS | Status: AC
Start: 2023-08-01 — End: 2023-08-01
  Administered 2023-08-01: 150 mg via INTRAVENOUS
  Filled 2023-08-01: qty 150

## 2023-08-01 MED ORDER — ATROPINE SULFATE 1 MG/ML IV SOLN
0.5000 mg | Freq: Once | INTRAVENOUS | Status: AC | PRN
  Administered 2023-08-01: 0.5 mg via INTRAVENOUS
  Filled 2023-08-01: qty 1

## 2023-08-01 MED ORDER — SODIUM CHLORIDE 0.9 % IV SOLN
180.0000 mg/m2 | Freq: Once | INTRAVENOUS | Status: AC
Start: 2023-08-01 — End: 2023-08-01
  Administered 2023-08-01: 400 mg via INTRAVENOUS
  Filled 2023-08-01: qty 15

## 2023-08-01 MED ORDER — PALONOSETRON HCL INJECTION 0.25 MG/5ML
0.2500 mg | Freq: Once | INTRAVENOUS | Status: AC
  Administered 2023-08-01: 0.25 mg via INTRAVENOUS
  Filled 2023-08-01: qty 5

## 2023-08-01 NOTE — Patient Instructions (Signed)
[facility] - A DEPT OF [facility]  Discharge Instructions: Thank you for choosing [facility] to provide your oncology and hematology care.   If you have a lab appointment with the Cancer Center, please go directly to the Cancer Center and check in at the registration area.   Wear comfortable clothing and clothing appropriate for easy access to any Portacath or PICC line.   We strive to give you quality time with your provider. You may need to reschedule your appointment if you arrive late (15 or more minutes).  Arriving late affects you and other patients whose appointments are after yours.  Also, if you miss three or more appointments without notifying the office, you may be dismissed from the clinic at the provider's discretion.      For prescription refill requests, have your pharmacy contact our office and allow 72 hours for refills to be completed.    Today you received the following chemotherapy and/or immunotherapy agents Bevacizumab-awwb, Irinotecan, Leucovorin, and Fluorouracil      To help prevent nausea and vomiting after your treatment, we encourage you to take your nausea medication as directed.  BELOW ARE SYMPTOMS THAT SHOULD BE REPORTED IMMEDIATELY: *FEVER GREATER THAN 100.4 F (38 C) OR HIGHER *CHILLS OR SWEATING *NAUSEA AND VOMITING THAT IS NOT CONTROLLED WITH YOUR NAUSEA MEDICATION *UNUSUAL SHORTNESS OF BREATH *UNUSUAL BRUISING OR BLEEDING *URINARY PROBLEMS (pain or burning when urinating, or frequent urination) *BOWEL PROBLEMS (unusual diarrhea, constipation, pain near the anus) TENDERNESS IN MOUTH AND THROAT WITH OR WITHOUT PRESENCE OF ULCERS (sore throat, sores in mouth, or a toothache) UNUSUAL RASH, SWELLING OR PAIN  UNUSUAL VAGINAL DISCHARGE OR ITCHING   Items with * indicate a potential emergency and should be followed up as soon as possible or go to the Emergency Department if any problems should occur.  Please  show the CHEMOTHERAPY ALERT CARD or IMMUNOTHERAPY ALERT CARD at check-in to the Emergency Department and triage nurse.  Should you have questions after your visit or need to cancel or reschedule your appointment, please contact [facility] - [facility]  Dept: [phone]  and follow the prompts.  Office hours are 8:00 a.m. to 4:30 p.m. Monday - Friday. Please note that voicemails left after 4:00 p.m. may not be returned until the following business day.  We are closed weekends and major holidays. You have access to a nurse at all times for urgent questions. Please call the main number to the clinic Dept: [phone] and follow the prompts.   For any non-urgent questions, you may also contact your provider using MyChart. We now offer e-Visits for anyone 66 and older to request care online for non-urgent symptoms. For details visit [email or web address].   Also download the MyChart app! Go to the app store, search "MyChart", open the app, select [place], and log in with your MyChart username and password.

## 2023-08-01 NOTE — Progress Notes (Signed)
Patient seen by Dr. Thornton Papas today  Vitals are within treatment parameters:Yes   Labs are within treatment parameters: Yes --OK to proceed w/trace urine protein  Treatment plan has been signed: Yes   Per physician team, Patient is ready for treatment. Please note the following modifications: Has added Avastin back to regimen.

## 2023-08-01 NOTE — Progress Notes (Signed)
Nutrition follow up completed with patient. He receives FOLFIRI + Bevacizumab q 14 days and is followed by Dr. Truett Perna. Recent CT findings consistent with stable disease.  His weight is stable at 243 pounds.  Glucose 323. Patient on Lantus.  Reports nausea and mild diarrhea after treatments. He c/o tongue pain. He drinks Glucerna nutrition shakes but finds it difficult to afford these. He is asking for samples if available. Blood sugars consistently high.  Nutrition Diagnosis: Food and Nutrition Related Knowledge Deficit related to colon cancer and associated treatments as evidenced by no prior need for nutrition related information.   Intervention: Provided samples of Glucerna and coupons for patient. Encouraged compliance with no concentrated sweets diet and medication to provide improved glycemic control.  Monitoring, Evaluation, Goals: Patient will tolerate adequate calories and protein for wt maintenance and adequate glycemic control.  Follow up to be scheduled as needed.

## 2023-08-01 NOTE — Progress Notes (Signed)
[facility] OFFICE PROGRESS NOTE   Diagnosis: Colon cancer  INTERVAL HISTORY:   Anthony Calhoun returns as scheduled.  He completed another cycle of FOLFIRI on 07/18/2023.  Reports nausea lasting for greater than 1 week following chemotherapy.  The nausea is relieved with Phenergan.  Mild diarrhea.  The tongue is sore.  No bleeding.  He reports feeling well in general.  Objective:  Vital signs in last 24 hours:  Blood pressure 117/61, pulse 86, temperature 98.1 F (36.7 C), temperature source Temporal, resp. rate 18, height 5\' 6"  (1.676 m), weight 243 lb 11.2 oz (110.5 kg), SpO2 99%.    HEENT: No thrush or ulcers Resp: Lungs clear bilaterally Cardio: Regular rate and rhythm GI: Nontender, no mass, no hepatosplenomegaly Vascular: No leg edema Skin: Palms without erythema  Portacath/PICC-without erythema  Lab Results:  Lab Results  Component Value Date   WBC 4.5 07/31/2023   HGB 9.5 (L) 07/31/2023   HCT 32.5 (L) 07/31/2023   MCV 82.7 07/31/2023   PLT 137 (L) 07/31/2023   NEUTROABS 3.2 07/31/2023    CMP  Lab Results  Component Value Date   NA 135 07/31/2023   K 3.8 07/31/2023   CL 100 07/31/2023   CO2 28 07/31/2023   GLUCOSE 323 (H) 07/31/2023   BUN 10 07/31/2023   CREATININE 0.84 07/31/2023   CALCIUM 9.0 07/31/2023   PROT 7.2 07/31/2023   ALBUMIN 4.1 07/31/2023   AST 23 07/31/2023   ALT 21 07/31/2023   ALKPHOS 122 07/31/2023   BILITOT 0.8 07/31/2023   GFRNONAA >60 07/31/2023   GFRAA >60 05/10/2020    Lab Results  Component Value Date   CEA1 1.08 03/07/2021   CEA 7.85 (H) 07/31/2023     Imaging:  CT CHEST ABDOMEN PELVIS W CONTRAST  Result Date: 08/01/2023 CLINICAL DATA:  Restaging metastatic colon cancer. Chemotherapy ongoing. Previous partial bowel resection. * Tracking Code: BO * EXAM: CT CHEST, ABDOMEN, AND PELVIS WITH CONTRAST TECHNIQUE: Multidetector CT imaging of the chest, abdomen and pelvis was performed following the standard  protocol during bolus administration of intravenous contrast. RADIATION DOSE REDUCTION: This exam was performed according to the departmental dose-optimization program which includes automated exposure control, adjustment of the mA and/or kV according to patient size and/or use of iterative reconstruction technique. CONTRAST:  OMNIPAQUE IOHEXOL 300 MG/ML  SOLN COMPARISON:  Prior CTs 05/02/2023 and 02/05/2023. FINDINGS: CT CHEST FINDINGS Cardiovascular: No acute vascular findings. Right IJ Port-A-Cath extends to the superior cavoatrial junction. Prominent coronary artery atherosclerosis again noted. The heart size is normal. There is no pericardial effusion. Mediastinum/Nodes: There are no enlarged mediastinal, hilar or axillary lymph nodes. Multiple prominent mediastinal lymph nodes are unchanged, including a 12 mm right paratracheal node with a retained fatty hilum. The thyroid gland appears stable, without significant findings. The esophagus appears unremarkable. Lungs/Pleura: No pleural effusion or pneumothorax. The lungs remain clear without suspicious nodularity. Musculoskeletal/Chest wall: No chest wall mass or suspicious osseous findings. Stable mild chronic T4 compression deformity. CT ABDOMEN AND PELVIS FINDINGS Hepatobiliary: Unchanged morphologic changes of cirrhosis. Unchanged partially treated lesions in the right hepatic lobe, largest measuring 2.2 x 1.4 cm on image 53/2 and 1.4 x 1.2 cm on image 76/2. No definite new or enlarging lesions. No evidence of gallstones, gallbladder wall thickening or biliary dilatation. Pancreas: Unremarkable. No pancreatic ductal dilatation or surrounding inflammatory changes. Spleen: Unchanged moderate splenomegaly.  No focal abnormality. Adrenals/Urinary Tract: Both adrenal glands appear normal. No evidence of urinary tract calculus,  suspicious renal lesion or hydronephrosis. The bladder appears normal for its degree of distention. Stomach/Bowel: Enteric contrast  has passed into the rectum. The stomach appears unremarkable for its degree of distension. No evidence of bowel wall thickening, distention or surrounding inflammatory change. Unchanged distal small bowel anastomosis and postsurgical changes from probable previous appendectomy and partial right colon resection. Vascular/Lymphatic: No significant change in ileocolonic mesenteric lymph nodes, largest measuring 2.0 x 1.4 cm on image 83/2 (previously 2.0 x 1.6 cm). No progressive adenopathy identified. Aortic and branch vessel atherosclerosis without evidence of aneurysm or large vessel occlusion. The portal, superior mesenteric and splenic veins are patent. Unchanged small recanalized periumbilical vein collaterals. Reproductive: The prostate gland and seminal vesicles appear unremarkable. Other: Stable postsurgical changes in the anterior abdominal wall. Left omental nodule measures 2.5 x 2.0 cm on image 70/2 (previously 2.4 x 2.0 cm). No progressive peritoneal nodularity or ascites. Musculoskeletal: No acute or significant osseous findings. Mild spondylosis. IMPRESSION: 1. Stable CT of the chest, abdomen and pelvis. No evidence of progressive metastatic disease. 2. Unchanged partially treated hepatic metastases, ileocolonic mesenteric lymph nodes and left omental nodule. 3. Unchanged morphologic changes of cirrhosis and sequelae of portal hypertension. 4.  Aortic Atherosclerosis (ICD10-I70.0). Electronically Signed   By: Carey Bullocks M.D.   On: 08/01/2023 09:36    Medications: I have reviewed the patient's current medications.   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 secondary to nausea (patient discontinued Xeloda on day 11 due to colonoscopy) Colonoscopy 07/09/2020-nodular mucosa at the colonic anastomosis, biopsied; patent end-to-side ileocolonic anastomosis characterized by healthy-appearing mucosa and visible sutures; nonbleeding internal hemorrhoids, biopsy with benign ulcerated anastomotic mucosa with granulation tissue Cycle 4 adjuvant Xeloda 07/19/2020, 2000 mg every morning and 1500 mg every afternoon Cycle 5 adjuvant Xeloda 08/09/2020 Cycle 6 adjuvant Xeloda 08/31/2019 Cycle 7 adjuvant Xeloda 09/20/2020 Cycle 8 adjuvant Xeloda 10/11/2020 CTs 03/07/2021-no evidence of recurrent disease, hepatic steatosis, slight wall thickening at the junction of the descending and horizontal duodenum Mild elevation of CEA 2023 12/01/2021-Guardant Reveal-ct DNA detected PET 12/30/2021-hypermetabolic right liver lesion, hypermetabolic area in a loop of mid small bowel with an SUV of 12.5, review of PET images at GI tumor conference 01/11/2022-small bowel uptake felt to be a benign finding MRI liver 01/10/2022-solitary 1.2 cm right liver lesion between segments 7 and 8 compatible with metastasis, subtle changes suggesting cirrhosis, hepatic steatosis CT enteroscopy 02/02/2022-small bowel loop with hypermetabolic activity on PET continues to have a bandlike density along the margin felt to represent a diverticulum or adjacent nodal tissue.  Small bowel tumor not excluded.  3-4 mm left omental nodule 02/08/2022-biopsy and ablation of solitary liver lesion, adenocarcinoma consistent with metastatic colorectal adenocarcinoma CTs 03/31/2022-unchanged circumstantial soft tissue thickening surrounding a loop of mid to distal small bowel with an adjacent nodular focus measuring 1.2 cm.,  Ablation cavity in segment 7, no other evidence of  metastatic disease Diagnostic laparoscopy 05/26/2022-mid small bowel mass-resected, well to moderately differentiated adenocarcinoma, 0/2 lymph nodes, morphology consistent with a colon primary, tumor involved full-thickness including serosa with perineural and large vessel involvement; foundation 1-microsatellite  stable, tumor mutation burden 2, K-ras Q61H CT abdomen/pelvis 07/10/2022-the right liver ablation defect is smaller, new subtle right liver lesion measuring 13 x 9 mm, enlarged lymph node in the right upper quadrant mesentery adjacent to surgical anastomosis PET 07/26/2022-new segment 6 liver lesion, enlarging hypermetabolic left omental lesion, there is another mildly hypermetabolic peritoneal implant, 7 mm omental nodule at the midline without hypermetabolism Cycle 1 FOLFOX 09/25/2022 Cycle 2 FOLFOX/bevacizumab 10/09/2022, 5-FU dose reduced secondary to mucositis Cycle 3 FOLFOX/bevacizumab 10/23/2022 Cycle 4 FOLFOX/bevacizumab 11/06/2022 Cycle 5 FOLFOX/bevacizumab 11/20/2022 Cycle 6 FOLFOX/bevacizumab 12/04/2022 CT abdomen/pelvis 12/14/2022-further contraction of the treated segment 7 lesion, slight enlargement of segment 6 lesion, no new liver lesion, increased size of ileocolic nodes and an omental lesion, stable small retroperitoneal nodes Cycle 7 FOLFOX/bevacizumab 12/18/2022 Cycle 8 FOLFOX/bevacizumab 01/02/2023, oxaliplatin dose reduced secondary to prolonged nausea and neuropathy symptoms, Emend added Cycle 9 FOLFOX/bevacizumab 01/16/2023, oxaliplatin discontinued secondary to neuropathy Cycle 1 FOLFIRI 02/19/2023, Avastin held due to recent dental extractions Cycle 2 FOLFIRI/Avastin 03/05/2023 Cycle 3 FOLFIRI/Avastin 03/19/2023, 5-FU bolus held due to mucositis after cycle 2, Fulphila added Cycle 4 FOLFIRI/Avastin 04/02/2023, Fulphila, Emend Cycle 5 FOLFIRI/Avastin 04/16/2023, Fulphila, Emend CTs 05/02/2023-similar ileocolic mesenteric adenopathy and omental metastasis, cirrhosis with portal  hypertension, stable liver lesions, no new or progressive disease Cycle 6 FOLFIRI/Avastin 05/07/2023, Fulphila, Emend Cycle 7 FOLFIRI/Avastin 05/21/2023, Fulphila, Emend Cycle 8 FOLFIRI 06/04/2023, Fulphila, Emend; Avastin held Cycle 9 FOLFIRI 06/18/2023, [name], Emend, Avastin held Treatment held 07/09/2023 patient request Cycle 10 FOLFIRI 07/18/2023, Fulphila, Emend, Avastin held CTs 07/31/2023: Stable with unchanged hepatic lesions, mesenteric lymph nodes, and left omental nodule, unchanged morphologic changes of cirrhosis and portal hypertension Cycle 11 FOLFIRI/Avastin 08/01/2023, Fulphila Microcytic anemia secondary to #1-improved Colon polyps-sessile serrated adenoma of the descending colon, tubular adenomas with focal high-grade dysplasia of the transverse colon on colonoscopy 02/12/2020, tubular adenoma of the mid ascending colon on the surgical specimen 04/12/2020 Family history of colon cancer Depression Anxiety/panic attacks Hypertension Hyperlipidemia Nausea secondary to Xeloda?-Resolved Gastroesophageal reflux disease-improved following discontinuation of Xeloda Anemia, microcytic; ferritin 6 01/24/2022; stool positive for blood x3 01/25/2022; 02/02/2022 CT abdomen/pelvis enterography-bowel loop demonstrating hypermetabolic activity continues to have a bandlike density along its margin possibly representing a diverticulum or adjacent nodal tissue, difficult to exclude small bowel tumor, 3 x 4 mm left omental nodule or lymph node, known right hepatic lobe tumor is occult on CT. Venofer weekly x3 beginning 03/03/2022 Negative capsule endoscopy 03/09/2022 Venofer 03/03/2022 for 3 weekly doses Venofer 05/03/2022, 05/19/2022 Venofer 07/28/2022, 08/04/2022 Venofer 03/22/2023, 03/29/2023, 04/04/2023 Venofer 06/18/2023, 07/09/2023 07/03/2023 upper endoscopy and colonoscopy-severe portal hypertensive gastropathy in the gastric fundus, gastric body, stomach, red blood found greater curvature of the stomach and  gastric antrum, treated with APC; colonoscopy showed dark old maroon-colored blood in the entire colon.  No evidence of active or recent bleeding in the colon. 12.  Anterior left neck nodule 03/17/2022-cyst?,  Lymph node? 13.  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  Oxaliplatin neuropathy-mild loss of vibratory sense 12/04/2022, 12/18/2022, 01/02/2023    Disposition: Anthony Calhoun appears stable.  I reviewed the restaging CT findings and images with him.  The CTs are consistent with stable disease.  The CEA is stable.  He is eligible for an immunotherapy trial at [facility], but we decided to continue FOLFIRI/Avastin given the  current CT findings.  He will complete another cycle of FOLFIRI/Avastin today.  He will return for an office visit and chemotherapy in 2 weeks.  We will plan for a restaging CT in 3 months.  The hemoglobin is stable.  We will check a ferritin level when he returns in 2 weeks.  Thornton Papas, MD  08/01/2023  10:47 AM

## 2023-08-02 ENCOUNTER — Other Ambulatory Visit: Payer: Self-pay

## 2023-08-03 ENCOUNTER — Other Ambulatory Visit: Payer: Self-pay | Admitting: *Deleted

## 2023-08-03 ENCOUNTER — Encounter: Payer: Self-pay | Admitting: Oncology

## 2023-08-03 ENCOUNTER — Inpatient Hospital Stay: Payer: Medicare HMO

## 2023-08-03 VITALS — BP 121/70 | HR 72 | Temp 98.1°F | Resp 18

## 2023-08-03 DIAGNOSIS — C787 Secondary malignant neoplasm of liver and intrahepatic bile duct: Secondary | ICD-10-CM

## 2023-08-03 DIAGNOSIS — K3189 Other diseases of stomach and duodenum: Secondary | ICD-10-CM | POA: Diagnosis not present

## 2023-08-03 DIAGNOSIS — I10 Essential (primary) hypertension: Secondary | ICD-10-CM | POA: Diagnosis not present

## 2023-08-03 DIAGNOSIS — E785 Hyperlipidemia, unspecified: Secondary | ICD-10-CM | POA: Diagnosis not present

## 2023-08-03 DIAGNOSIS — F32A Depression, unspecified: Secondary | ICD-10-CM | POA: Diagnosis not present

## 2023-08-03 DIAGNOSIS — D509 Iron deficiency anemia, unspecified: Secondary | ICD-10-CM | POA: Diagnosis not present

## 2023-08-03 DIAGNOSIS — Z79899 Other long term (current) drug therapy: Secondary | ICD-10-CM | POA: Diagnosis not present

## 2023-08-03 DIAGNOSIS — C182 Malignant neoplasm of ascending colon: Secondary | ICD-10-CM | POA: Diagnosis not present

## 2023-08-03 DIAGNOSIS — F410 Panic disorder [episodic paroxysmal anxiety] without agoraphobia: Secondary | ICD-10-CM | POA: Diagnosis not present

## 2023-08-03 DIAGNOSIS — Z5111 Encounter for antineoplastic chemotherapy: Secondary | ICD-10-CM | POA: Diagnosis not present

## 2023-08-03 DIAGNOSIS — K219 Gastro-esophageal reflux disease without esophagitis: Secondary | ICD-10-CM | POA: Diagnosis not present

## 2023-08-03 MED ORDER — PEGFILGRASTIM-JMDB 6 MG/0.6ML ~~LOC~~ SOSY
6.0000 mg | PREFILLED_SYRINGE | Freq: Once | SUBCUTANEOUS | Status: AC
  Administered 2023-08-03: 6 mg via SUBCUTANEOUS
  Filled 2023-08-03: qty 0.6

## 2023-08-03 MED ORDER — HEPARIN SOD (PORK) LOCK FLUSH 100 UNIT/ML IV SOLN
500.0000 [IU] | Freq: Once | INTRAVENOUS | Status: AC | PRN
  Administered 2023-08-03: 500 [IU]

## 2023-08-03 MED ORDER — SODIUM CHLORIDE 0.9% FLUSH
10.0000 mL | INTRAVENOUS | Status: DC | PRN
  Administered 2023-08-03: 10 mL

## 2023-08-03 NOTE — Patient Instructions (Signed)

## 2023-08-06 ENCOUNTER — Encounter: Payer: Self-pay | Admitting: Oncology

## 2023-08-11 ENCOUNTER — Other Ambulatory Visit: Payer: Self-pay | Admitting: Oncology

## 2023-08-13 ENCOUNTER — Encounter: Payer: Self-pay | Admitting: Oncology

## 2023-08-15 ENCOUNTER — Other Ambulatory Visit: Payer: Medicare HMO

## 2023-08-15 ENCOUNTER — Inpatient Hospital Stay: Payer: Medicare HMO

## 2023-08-15 ENCOUNTER — Inpatient Hospital Stay (HOSPITAL_BASED_OUTPATIENT_CLINIC_OR_DEPARTMENT_OTHER): Payer: Medicare HMO | Admitting: Nurse Practitioner

## 2023-08-15 ENCOUNTER — Ambulatory Visit: Payer: Medicare HMO

## 2023-08-15 ENCOUNTER — Encounter: Payer: Self-pay | Admitting: Nurse Practitioner

## 2023-08-15 ENCOUNTER — Ambulatory Visit: Payer: Medicare HMO | Admitting: Nurse Practitioner

## 2023-08-15 VITALS — BP 105/59 | HR 93 | Temp 98.2°F | Resp 18 | Ht 66.0 in | Wt 243.0 lb

## 2023-08-15 DIAGNOSIS — D509 Iron deficiency anemia, unspecified: Secondary | ICD-10-CM | POA: Diagnosis not present

## 2023-08-15 DIAGNOSIS — Z79899 Other long term (current) drug therapy: Secondary | ICD-10-CM | POA: Diagnosis not present

## 2023-08-15 DIAGNOSIS — Z5111 Encounter for antineoplastic chemotherapy: Secondary | ICD-10-CM | POA: Diagnosis not present

## 2023-08-15 DIAGNOSIS — E785 Hyperlipidemia, unspecified: Secondary | ICD-10-CM | POA: Diagnosis not present

## 2023-08-15 DIAGNOSIS — C787 Secondary malignant neoplasm of liver and intrahepatic bile duct: Secondary | ICD-10-CM

## 2023-08-15 DIAGNOSIS — C182 Malignant neoplasm of ascending colon: Secondary | ICD-10-CM | POA: Diagnosis not present

## 2023-08-15 DIAGNOSIS — Z95828 Presence of other vascular implants and grafts: Secondary | ICD-10-CM

## 2023-08-15 DIAGNOSIS — F410 Panic disorder [episodic paroxysmal anxiety] without agoraphobia: Secondary | ICD-10-CM | POA: Diagnosis not present

## 2023-08-15 DIAGNOSIS — C189 Malignant neoplasm of colon, unspecified: Secondary | ICD-10-CM

## 2023-08-15 DIAGNOSIS — I10 Essential (primary) hypertension: Secondary | ICD-10-CM | POA: Diagnosis not present

## 2023-08-15 DIAGNOSIS — K3189 Other diseases of stomach and duodenum: Secondary | ICD-10-CM | POA: Diagnosis not present

## 2023-08-15 DIAGNOSIS — K219 Gastro-esophageal reflux disease without esophagitis: Secondary | ICD-10-CM | POA: Diagnosis not present

## 2023-08-15 DIAGNOSIS — F32A Depression, unspecified: Secondary | ICD-10-CM | POA: Diagnosis not present

## 2023-08-15 LAB — CBC WITH DIFFERENTIAL (CANCER CENTER ONLY)
Abs Immature Granulocytes: 0.05 10*3/uL (ref 0.00–0.07)
Basophils Absolute: 0.1 10*3/uL (ref 0.0–0.1)
Basophils Relative: 1 %
Eosinophils Absolute: 0.1 10*3/uL (ref 0.0–0.5)
Eosinophils Relative: 2 %
HCT: 32.1 % — ABNORMAL LOW (ref 39.0–52.0)
Hemoglobin: 9.3 g/dL — ABNORMAL LOW (ref 13.0–17.0)
Immature Granulocytes: 1 %
Lymphocytes Relative: 21 %
Lymphs Abs: 0.9 10*3/uL (ref 0.7–4.0)
MCH: 23.7 pg — ABNORMAL LOW (ref 26.0–34.0)
MCHC: 29 g/dL — ABNORMAL LOW (ref 30.0–36.0)
MCV: 81.9 fL (ref 80.0–100.0)
Monocytes Absolute: 0.3 10*3/uL (ref 0.1–1.0)
Monocytes Relative: 7 %
Neutro Abs: 3 10*3/uL (ref 1.7–7.7)
Neutrophils Relative %: 68 %
Platelet Count: 172 10*3/uL (ref 150–400)
RBC: 3.92 MIL/uL — ABNORMAL LOW (ref 4.22–5.81)
RDW: 19.9 % — ABNORMAL HIGH (ref 11.5–15.5)
WBC Count: 4.4 10*3/uL (ref 4.0–10.5)
nRBC: 0 % (ref 0.0–0.2)

## 2023-08-15 LAB — CMP (CANCER CENTER ONLY)
ALT: 23 U/L (ref 0–44)
AST: 20 U/L (ref 15–41)
Albumin: 3.9 g/dL (ref 3.5–5.0)
Alkaline Phosphatase: 145 U/L — ABNORMAL HIGH (ref 38–126)
Anion gap: 8 (ref 5–15)
BUN: 7 mg/dL (ref 6–20)
CO2: 26 mmol/L (ref 22–32)
Calcium: 8.5 mg/dL — ABNORMAL LOW (ref 8.9–10.3)
Chloride: 103 mmol/L (ref 98–111)
Creatinine: 0.74 mg/dL (ref 0.61–1.24)
GFR, Estimated: >60 mL/min (ref >60)
Glucose, Bld: 227 mg/dL — ABNORMAL HIGH (ref 70–99)
Potassium: 3.6 mmol/L (ref 3.5–5.1)
Sodium: 137 mmol/L (ref 135–145)
Total Bilirubin: 0.7 mg/dL (ref <1.2)
Total Protein: 6.9 g/dL (ref 6.5–8.1)

## 2023-08-15 LAB — CEA (ACCESS): CEA (CHCC): 5.44 ng/mL — ABNORMAL HIGH (ref 0.00–5.00)

## 2023-08-15 LAB — FERRITIN: Ferritin: 42 ng/mL (ref 24–336)

## 2023-08-15 LAB — TOTAL PROTEIN, URINE DIPSTICK: Protein, ur: NEGATIVE mg/dL

## 2023-08-15 MED ORDER — HEPARIN SOD (PORK) LOCK FLUSH 100 UNIT/ML IV SOLN
500.0000 [IU] | Freq: Once | INTRAVENOUS | Status: AC
  Administered 2023-08-15: 500 [IU] via INTRAVENOUS

## 2023-08-15 MED ORDER — SODIUM CHLORIDE 0.9% FLUSH
10.0000 mL | INTRAVENOUS | Status: AC | PRN
  Administered 2023-08-15: 10 mL via INTRAVENOUS

## 2023-08-15 NOTE — Progress Notes (Signed)
[facility] OFFICE PROGRESS NOTE   Diagnosis: Colon cancer  INTERVAL HISTORY:   Mr. Millet returns as scheduled.  He completed cycle 11 FOLFIRI/Avastin 08/01/2023.  He notes a increase in nausea following chemotherapy.  After a few days the nausea returns to baseline.  No mouth sores though he does note recurrent burning on his tongue.  No diarrhea.  No bleeding except with nose blowing.  Last week he developed a sore throat and cough.  He has a persistent "runny nose".  No fever or chills.  Objective:  Vital signs in last 24 hours:  Blood pressure (!) 105/59, pulse 93, temperature 98.2 F (36.8 C), temperature source Temporal, resp. rate 18, height 5\' 6"  (1.676 m), weight 243 lb (110.2 kg), SpO2 98%.    HEENT: No thrush or ulcers. Resp: Lungs clear bilaterally. Cardio: Regular rate and rhythm. GI: Abdomen soft and nontender.  No hepatosplenomegaly. Vascular: No leg edema. Skin: Palms without erythema. Port-A-Cath without erythema.  Lab Results:  Lab Results  Component Value Date   WBC 4.4 08/15/2023   HGB 9.3 (L) 08/15/2023   HCT 32.1 (L) 08/15/2023   MCV 81.9 08/15/2023   PLT 172 08/15/2023   NEUTROABS 3.0 08/15/2023    Imaging:  No results found.  Medications: I have reviewed the patient's current medications.  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 secondary to nausea (patient discontinued Xeloda on day 11 due to colonoscopy) Colonoscopy 07/09/2020-nodular  mucosa at the colonic anastomosis, biopsied; patent end-to-side ileocolonic anastomosis characterized by healthy-appearing mucosa and visible sutures; nonbleeding internal hemorrhoids, biopsy with benign ulcerated anastomotic mucosa with granulation tissue Cycle 4 adjuvant Xeloda 07/19/2020, 2000 mg every morning and 1500 mg every afternoon Cycle 5 adjuvant Xeloda 08/09/2020 Cycle 6 adjuvant Xeloda 08/31/2019 Cycle 7 adjuvant Xeloda 09/20/2020 Cycle 8 adjuvant Xeloda 10/11/2020 CTs 03/07/2021-no evidence of recurrent disease, hepatic steatosis, slight wall thickening at the junction of the descending and horizontal duodenum Mild elevation of CEA 2023 12/01/2021-Guardant Reveal-ct DNA detected PET 12/30/2021-hypermetabolic right liver lesion, hypermetabolic area in a loop of mid small bowel with an SUV of 12.5, review of PET images at GI tumor conference 01/11/2022-small bowel uptake felt to be a benign finding MRI liver 01/10/2022-solitary 1.2 cm right liver lesion between segments 7 and 8 compatible with metastasis, subtle changes suggesting cirrhosis, hepatic steatosis CT enteroscopy 02/02/2022-small bowel loop with hypermetabolic activity on PET continues to have a bandlike density along the margin felt to represent a diverticulum or adjacent nodal tissue.  Small bowel tumor not excluded.  3-4 mm left omental nodule 02/08/2022-biopsy and ablation of solitary liver lesion, adenocarcinoma consistent with metastatic colorectal adenocarcinoma CTs 03/31/2022-unchanged circumstantial soft tissue thickening surrounding a loop of mid to distal small bowel with an adjacent nodular focus measuring 1.2 cm.,  Ablation cavity in segment 7, no other evidence of metastatic disease Diagnostic laparoscopy 05/26/2022-mid small bowel mass-resected, well to moderately differentiated adenocarcinoma, 0/2 lymph nodes, morphology consistent with a colon primary, tumor involved full-thickness including serosa with perineural and large  vessel involvement; foundation 1-microsatellite stable, tumor mutation burden 2, K-ras Q61H CT abdomen/pelvis  07/10/2022-the right liver ablation defect is smaller, new subtle right liver lesion measuring 13 x 9 mm, enlarged lymph node in the right upper quadrant mesentery adjacent to surgical anastomosis PET 07/26/2022-new segment 6 liver lesion, enlarging hypermetabolic left omental lesion, there is another mildly hypermetabolic peritoneal implant, 7 mm omental nodule at the midline without hypermetabolism Cycle 1 FOLFOX 09/25/2022 Cycle 2 FOLFOX/bevacizumab 10/09/2022, 5-FU dose reduced secondary to mucositis Cycle 3 FOLFOX/bevacizumab 10/23/2022 Cycle 4 FOLFOX/bevacizumab 11/06/2022 Cycle 5 FOLFOX/bevacizumab 11/20/2022 Cycle 6 FOLFOX/bevacizumab 12/04/2022 CT abdomen/pelvis 12/14/2022-further contraction of the treated segment 7 lesion, slight enlargement of segment 6 lesion, no new liver lesion, increased size of ileocolic nodes and an omental lesion, stable small retroperitoneal nodes Cycle 7 FOLFOX/bevacizumab 12/18/2022 Cycle 8 FOLFOX/bevacizumab 01/02/2023, oxaliplatin dose reduced secondary to prolonged nausea and neuropathy symptoms, Emend added Cycle 9 FOLFOX/bevacizumab 01/16/2023, oxaliplatin discontinued secondary to neuropathy Cycle 1 FOLFIRI 02/19/2023, Avastin held due to recent dental extractions Cycle 2 FOLFIRI/Avastin 03/05/2023 Cycle 3 FOLFIRI/Avastin 03/19/2023, 5-FU bolus held due to mucositis after cycle 2, Fulphila added Cycle 4 FOLFIRI/Avastin 04/02/2023, Fulphila, Emend Cycle 5 FOLFIRI/Avastin 04/16/2023, Fulphila, Emend CTs 05/02/2023-similar ileocolic mesenteric adenopathy and omental metastasis, cirrhosis with portal hypertension, stable liver lesions, no new or progressive disease Cycle 6 FOLFIRI/Avastin 05/07/2023, Fulphila, Emend Cycle 7 FOLFIRI/Avastin 05/21/2023, Fulphila, Emend Cycle 8 FOLFIRI 06/04/2023, Fulphila, Emend; Avastin held Cycle 9 FOLFIRI 06/18/2023, [name], Emend,  Avastin held Treatment held 07/09/2023 patient request Cycle 10 FOLFIRI 07/18/2023, Fulphila, Emend, Avastin held CTs 07/31/2023: Stable with unchanged hepatic lesions, mesenteric lymph nodes, and left omental nodule, unchanged morphologic changes of cirrhosis and portal hypertension Cycle 11 FOLFIRI/Avastin 08/01/2023, Fulphila Chemotherapy held 08/15/2023 per patient request Microcytic anemia secondary to #1-improved Colon polyps-sessile serrated adenoma of the descending colon, tubular adenomas with focal high-grade dysplasia of the transverse colon on colonoscopy 02/12/2020, tubular adenoma of the mid ascending colon on the surgical specimen 04/12/2020 Family history of colon cancer Depression Anxiety/panic attacks Hypertension Hyperlipidemia Nausea secondary to Xeloda?-Resolved Gastroesophageal reflux disease-improved following discontinuation of Xeloda Anemia, microcytic; ferritin 6 01/24/2022; stool positive for blood x3 01/25/2022; 02/02/2022 CT abdomen/pelvis enterography-bowel loop demonstrating hypermetabolic activity continues to have a bandlike density along its margin possibly representing a diverticulum or adjacent nodal tissue, difficult to exclude small bowel tumor, 3 x 4 mm left omental nodule or lymph node, known right hepatic lobe tumor is occult on CT. Venofer weekly x3 beginning 03/03/2022 Negative capsule endoscopy 03/09/2022 Venofer 03/03/2022 for 3 weekly doses Venofer 05/03/2022, 05/19/2022 Venofer 07/28/2022, 08/04/2022 Venofer 03/22/2023, 03/29/2023, 04/04/2023 Venofer 06/18/2023, 07/09/2023 07/03/2023 upper endoscopy and colonoscopy-severe portal hypertensive gastropathy in the gastric fundus, gastric body, stomach, red blood found greater curvature of the stomach and gastric antrum, treated with APC; colonoscopy showed dark old maroon-colored blood in the entire colon.  No evidence of active or recent bleeding in the colon. 12.  Anterior left neck nodule 03/17/2022-cyst?,  Lymph  node? 13.  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  He has completed 11 cycles of FOLFIRI/Avastin.  Overall seems to be tolerating treatment well.  He understands the rhinorrhea may be related to chemotherapy.  He does not want to proceed with chemotherapy today.  We discussed rescheduling to next week.  He prefers to  reschedule in 2 weeks.  We reviewed the labs from today.  CEA is lower.  Ferritin is stable.  The sore throat and cough are likely related to a URI.  He will treat symptomatically.  He will return for follow-up and treatment on 08/30/2023.  We are available to see him sooner if needed.  Lonna Cobb ANP/GNP-BC   08/15/2023  9:56 AM

## 2023-08-15 NOTE — Progress Notes (Signed)
 Patient seen by Lonna Cobb NP today  Vitals are within treatment parameters:Yes   Labs are within treatment parameters: Yes   Treatment plan has been signed: Yes   Per physician team, Patient will not be receiving treatment today.

## 2023-08-15 NOTE — Patient Instructions (Signed)
 [employer], Adult A central line is a long, thin tube (catheter) that can be used to collect blood for testing or to give medicine through a vein. The tip of the central line ends in a large vein just above the heart (vena cava). A central line may be placed because: You need to get medicines or fluids through an IV for a long period of time. You need nutrition but cannot eat or absorb nutrients. The veins in your hands or arms are difficult to use for IV access. You need a blood transfusion. You need chemotherapy or dialysis. Types of central lines There are four main types of central lines: Peripherally inserted central catheter (PICC) line. This type is used for access of one week or longer. It can be used to draw blood and give fluids or medicines. A PICC looks like an IV tube, but it goes up the arm to the heart. It is usually inserted in the upper arm and taped in place on the arm. Tunneled central line. This type is used for long-term therapy and dialysis. It is placed in a large vein in the neck, chest, or groin. It is inserted through a small incision made over the vein, and then it is advanced to the heart. It is tunneled under the skin and brought out through a second incision. Non-tunneled central line. This type is used for short-term access, usually for a maximum of 7 days. It is often used in the emergency department. It is inserted in the neck, chest, or groin. Implanted port. This type is used for long-term therapy. It can stay in place longer than other types of central lines. It is normally inserted in the upper chest, but it can also be placed in the upper arm or the abdomen. It is inserted and removed with surgery, and it is accessed using a needle. The type of central line that you receive depends on how long you will need it, your medical condition, and the condition of your veins. Tell a health care provider about: Any allergies you have. All medicines you are taking,  including vitamins, herbs, eye drops, creams, and over-the-counter medicines. Any problems you or family members have had with anesthetic medicines. Any blood disorders you have. Any surgeries you have had. Any medical conditions you have. Whether you are pregnant or may be pregnant. What are the risks? Generally, placement and use of a central line is safe. However, problems may occur, including: Infection. A blood clot that blocks the central line or forms in the vein and travels to the heart. Bleeding from the place where the central line was inserted. Developing a hole or crack within the central line. If this happens, the central line will need to be replaced. Central line failure. The catheter moving or coming out of place. What happens before the procedure? Medicines Ask your health care provider about: Changing or stopping your regular medicines. This is especially important if you are taking diabetes medicines or blood thinners. Taking medicines such as aspirin and ibuprofen. These medicines can thin your blood. Do not take these medicines unless your health care provider tells you to take them. Taking over-the-counter medicines, vitamins, herbs, and supplements. General instructions Follow instructions from your health care provider about eating or drinking restrictions. Ask your health care provider: How your procedure site will be marked. What steps will be taken to help prevent infection. These steps may include: Removing hair at the procedure site. Washing skin with a germ-killing soap.  Plan to have a responsible adult take you home from the hospital or clinic. If you will be going home right after the procedure, plan to have a responsible adult care for you for the time you are told. This is important. What happens during the procedure? The procedure will vary depending on the type of central line being placed. In general: An IV will be inserted into one of your  veins. You will be given one or more of the following: A medicine to help you relax (sedative). A medicine to numb the area (local anesthetic). Your skin will be cleaned with a germ-killing (antiseptic) solution, and you may be covered with sterile drapes. Your blood pressure, heart rate, breathing rate, and blood oxygen level will be monitored during the procedure. The central line catheter will be inserted into the vein and advanced to the correct spot. The health care provider may use X-ray equipment to help guide the catheter to the right place. A bandage (dressing) will be placed over the insertion area. The procedure may vary among health care providers and hospitals. What can I expect after the procedure? Your blood pressure, heart rate, breathing rate, and blood oxygen level will be monitored until you leave the hospital or clinic. Antiseptic caps may be placed on the ends of the central line tubing. If you were given a sedative during the procedure, it can affect you for several hours. Do not drive or operate machinery until your health care provider says that it is safe. Follow these instructions at home: Flushing and cleaning the central line  Follow instructions from your health care provider about flushing and cleaning the central line and the area around it. Only use sterile supplies to flush the central line. Use supplies from your health care provider, a pharmacy, or another source that is recommended by your health care provider. Before you flush the central line or clean the central line or the area around it: Wash your hands with soap and water for at least 20 seconds. If soap and water are not available, use alcohol-based hand sanitizer. Clean the central line hub with rubbing alcohol. Unless directed otherwise by the manufacturer's instructions, scrub using a twisting motion and rub for 10 to 15 seconds or for 30 twists. Be sure you scrub the top of the hub, not just the  sides. Never reuse alcohol pads. Let the hub dry before use. Prevent it from touching anything while drying. Caring for the incision or central line site Check your incision or central line site every day for signs of infection. Check for: Redness, swelling, or pain. Fluid or blood. Warmth. Pus or a bad smell. Keep the insertion site of your central line clean and dry at all times. Change your dressing only as told by your health care provider. Keep your dressing dry. If it gets wet, have it changed as soon as possible. General instructions Follow instructions from your health care provider for the type of device that you have. Keep the tube clamped, unless it is being used. If the central line accidentally gets pulled on, make sure: The dressing is okay. There is no bleeding. The line has not been pulled out. Do not use scissors or sharp objects near the tube. Do not take baths, swim, or use a hot tub until your health care provider approves. Ask your health care provider if you may take showers. You may only be allowed to take sponge baths. Ask your health care provider what activities are  safe for you. You may be restricted from lifting or making repetitive arm movements on the side of your central line. Take over-the-counter and prescription medicines only as told by your health care provider. Keep all follow-up visits. This is important. Storage and disposal of supplies Keep your supplies in a clean, dry location. Throw away any used syringes in a disposal container that is meant for sharp items (sharps container). You can buy a sharps container from a pharmacy, or you can make one by using an empty hard plastic bottle with a cover. Place any used dressings or infusion bags into a plastic bag. Throw that bag in the trash. Contact a health care provider if: You have redness, swelling, or pain around your insertion site. You have fluid or blood coming from your insertion site. Your  insertion site feels warm to the touch. You have pus or a bad smell coming from your insertion site. Get help right away if: You have: A fever or chills. Shortness of breath. Chest pain or a racing heartbeat. Swelling in your neck, face, chest, or arm on the side of your central line. You feel dizzy or you faint. Your incision or central line site has red streaks spreading away from the area. Your incision or central line site is bleeding and does not stop. Your central line is difficult to flush or will not flush. You do not get a blood return from the central line. Your central line gets loose or damaged or comes out. Your catheter leaks when flushed or when fluids are infused into it. Summary A central line is a long, thin tube (catheter) that can be used to give medicine through a vein. Follow specific instructions from your health care provider for the type of device that you have. Keep the insertion site of your central line clean and dry at all times. Keep the tube clamped, unless it is being used. This information is not intended to replace advice given to you by your health care provider. Make sure you discuss any questions you have with your health care provider. Document Revised: 04/14/2020 Document Reviewed: 04/15/2020 Elsevier Patient Education  2024 [employer].

## 2023-08-15 NOTE — Addendum Note (Signed)
Addended by: Dimitri Ped on: 08/15/2023 11:07 AM   Modules accepted: Orders

## 2023-08-16 ENCOUNTER — Other Ambulatory Visit: Payer: Self-pay

## 2023-08-16 ENCOUNTER — Encounter: Payer: Self-pay | Admitting: Oncology

## 2023-08-17 ENCOUNTER — Encounter: Payer: Self-pay | Admitting: Oncology

## 2023-08-17 ENCOUNTER — Inpatient Hospital Stay: Payer: Medicare HMO

## 2023-08-20 ENCOUNTER — Encounter (HOSPITAL_COMMUNITY): Payer: Self-pay | Admitting: Physician Assistant

## 2023-08-20 ENCOUNTER — Ambulatory Visit (INDEPENDENT_AMBULATORY_CARE_PROVIDER_SITE_OTHER): Payer: Medicare HMO

## 2023-08-20 ENCOUNTER — Telehealth: Payer: Self-pay

## 2023-08-20 ENCOUNTER — Encounter (HOSPITAL_COMMUNITY): Payer: Self-pay

## 2023-08-20 ENCOUNTER — Ambulatory Visit (HOSPITAL_COMMUNITY)
Admission: EM | Admit: 2023-08-20 | Discharge: 2023-08-20 | Disposition: A | Discharge status: Home / Self Care | Payer: Medicare HMO

## 2023-08-20 DIAGNOSIS — J329 Chronic sinusitis, unspecified: Secondary | ICD-10-CM | POA: Diagnosis not present

## 2023-08-20 DIAGNOSIS — R059 Cough, unspecified: Secondary | ICD-10-CM | POA: Diagnosis not present

## 2023-08-20 DIAGNOSIS — R051 Acute cough: Secondary | ICD-10-CM

## 2023-08-20 DIAGNOSIS — J40 Bronchitis, not specified as acute or chronic: Secondary | ICD-10-CM | POA: Diagnosis not present

## 2023-08-20 MED ORDER — DOXYCYCLINE HYCLATE 100 MG PO CAPS: 100.0000 mg | ORAL_CAPSULE | Freq: Two times a day (BID) | ORAL | 0 refills | Status: DC

## 2023-08-20 MED ORDER — PROMETHAZINE-DM 6.25-15 MG/5ML PO SYRP: 5.0000 mL | ORAL_SOLUTION | Freq: Two times a day (BID) | ORAL | 0 refills | Status: DC | PRN

## 2023-08-20 NOTE — Telephone Encounter (Signed)
The patient reported experiencing cold symptoms, including coughing, but noted no shortness of breath or fever. He expressed a need for guidance as over-the-counter medications have not provided relief. I advised the patient to contact his primary care physician (PCP). The patient called back to indicate that his PCP was not available. I recommended that he visit urgent care for assistance with his symptoms. The patient acknowledged this advice and had no further questions or concerns.

## 2023-08-20 NOTE — ED Triage Notes (Signed)
This started with "cough at times about 2 wks ago, since the last week or so the Cough is a lot with occasionally sob and runny nose, facial pain, sinus pressure/pain". Cough is really bad at night. No fever. No history of Asthma or COPD. "I am a cancer patient, postponed treatment, last treatment about 10days ago, to continue after the holidays".

## 2023-08-20 NOTE — Discharge Instructions (Signed)
I did not see any evidence of pneumonia on your x-ray but we will contact you if the radiologist sees something else and we need to change your treatment plan.  Start doxycycline 100 mg twice daily for 10 days.  Stay out of the sun while on this medication as it can cause you to have a sunburn.  Continue over-the-counter medications including Mucinex, Tylenol, Flonase, nasal saline/sinus rinses.  Take Promethazine DM for cough.  This will make you sleepy so do not drive or drink alcohol while taking it.  If your symptoms or not improving within a week please return for reevaluation.  If anything worsens and you have chest pain, shortness of breath, worsening cough, weakness, nausea, vomiting you should be seen immediately.

## 2023-08-20 NOTE — ED Provider Notes (Signed)
[facility]    CSN: [id number] Arrival date & time: 08/20/23  1557      History   Chief Complaint Chief Complaint  Patient presents with   URI    HPI Anthony Calhoun is a 57 y.o. male.   Patient presents today with a 2-week history of ongoing cough.  Reports he has had chronic rhinorrhea but this has previously been attributed to the cancer treatment he is currently receiving.  He reports that his current symptoms are not the same as previous episodes of rhinorrhea and he is now experiencing congestion, facial pain, sinus pressure, worsening cough, shortness of breath.  Denies any fever, chest pain, nausea, vomiting, diarrhea.  He did postpone his most recent cancer treatment as a result of the symptoms.  Denies any known sick contacts.  Denies any history of asthma, COPD, allergies.  He has tried multiple over-the-counter medications including Mucinex without improvement of symptoms.  Denies any recent antibiotics or steroids; does receive injectable steroid as part of his cancer treatments but does not remember the name of this but denies additional steroid use.    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   Metastatic colon cancer to liver (HCC) 08/10/2022   Burning tongue 08/10/2022   Small bowel mass 05/26/2022   Iron deficiency anemia 03/02/2022   Colon cancer metastasized to liver (HCC) 02/08/2022   Colon cancer, ascending (HCC) 04/12/2020    Past Surgical History:   Procedure Laterality Date   APPENDECTOMY     BOWEL RESECTION N/A 05/26/2022   Procedure: SMALL BOWEL RESECTION;  Surgeon: Romie Levee, MD;  Location: [facility];  Service: General;  Laterality: N/A;   COLON SURGERY     COLONOSCOPY     COLONOSCOPY WITH PROPOFOL N/A 07/03/2023   Procedure: COLONOSCOPY WITH PROPOFOL;  Surgeon: Kerin Salen, MD;  Location: [facility];  Service: Gastroenterology;  Laterality: N/A;   CYSTOSCOPY W/ URETEROSCOPY W/ LITHOTRIPSY  1981   ESOPHAGOGASTRODUODENOSCOPY (EGD) WITH PROPOFOL N/A 07/03/2023   Procedure: ESOPHAGOGASTRODUODENOSCOPY (EGD) WITH PROPOFOL;  Surgeon: Kerin Salen, MD;  Location: [facility];  Service: Gastroenterology;  Laterality: N/A;  with banding   HOT HEMOSTASIS N/A 07/03/2023   Procedure: HOT HEMOSTASIS (ARGON PLASMA COAGULATION/BICAP);  Surgeon: Kerin Salen, MD;  Location: [facility];  Service: Gastroenterology;  Laterality: N/A;   IR IMAGING GUIDED PORT INSERTION  09/21/2022   IR RADIOLOGIST EVAL & MGMT  01/17/2022   IR RADIOLOGIST EVAL & MGMT  03/07/2022   IR RADIOLOGIST EVAL & MGMT  07/13/2022   IR RADIOLOGIST EVAL & MGMT  09/07/2022   LAPAROSCOPY N/A 05/26/2022   Procedure: LAPAROSCOPY DIAGNOSTIC;  Surgeon: Romie Levee, MD;  Location: [facility];  Service: General;  Laterality: N/A;   RADIOLOGY WITH ANESTHESIA N/A 02/08/2022   Procedure: CT MICROWAVE ABLATION;  Surgeon: Sterling Big, MD;  Location: [facility];  Service: Radiology;  Laterality: N/A;  Home Medications    Prior to Admission medications   Medication Sig Start Date End Date Taking? Authorizing Provider  doxycycline (VIBRAMYCIN) 100 MG capsule Take 1 capsule (100 mg total) by mouth 2 (two) times daily. 08/20/23  Yes Tara Rud K, PA-C  promethazine-dextromethorphan (PROMETHAZINE-DM) 6.25-15 MG/5ML syrup Take 5 mLs by mouth 2 (two) times daily as needed for cough. 08/20/23  Yes Lakia Gritton K, PA-C  Accu-Chek Softclix Lancets lancets SMARTSIG:Topical 08/13/23    [provider]  acetaminophen (TYLENOL) 500 MG tablet Take 500 mg by mouth every 6 (six) hours as needed.    [provider]  ALPRAZolam Prudy Feeler) 1 MG tablet Take one tablet three times daily as needed for anxiety. 02/22/23   Mozingo, Thereasa Solo, NP  amLODipine (NORVASC) 5 MG tablet Take 5 mg by mouth daily. 03/11/20   [provider]  Bempedoic Acid (NEXLETOL) 180 MG TABS Take 180 mg by mouth daily.    [provider]  carvedilol (COREG) 6.25 MG tablet Take 6.25 mg by mouth 2 (two) times daily with a meal. 10/01/19   [provider]  HYDROcodone-acetaminophen (NORCO) 5-325 MG tablet Take 1 tablet by mouth every 12 (twelve) hours as needed for moderate pain. Patient not taking: Reported on 08/01/2023 10/03/22   Ladene Artist, MD  LANTUS SOLOSTAR 100 UNIT/ML Solostar Pen Inject 5 Units into the skin daily. Taper up daily 01/15/23   [provider]  lidocaine-prilocaine (EMLA) cream Apply 1 Application topically as needed. Apply 1/2 tablespoon to port site 1-2 hours prior to stick and cover w/Press-and-Seal to numb site for port access 01/02/23   Ladene Artist, MD  magic mouthwash SOLN Take 5 mLs by mouth 4 (four) times daily as needed for mouth pain (swish and spit -maximum of 20 ml/24 hours). 07/25/23   Ladene Artist, MD  ondansetron (ZOFRAN) 8 MG tablet Take 1 tablet (8 mg total) by mouth every 8 (eight) hours as needed for nausea. Patient not taking: Reported on 08/01/2023 03/23/22   Ladene Artist, MD  pantoprazole (PROTONIX) 40 MG tablet TAKE 1 TABLET BY MOUTH EVERY DAY Patient taking differently: Take 40 mg by mouth 2 (two) times daily. 01/11/23   Ladene Artist, MD  promethazine (PHENERGAN) 25 MG tablet TAKE 1 TABLET BY MOUTH EVERY 6 HOURS AS NEEDED FOR NAUSEA OR VOMITING. 12/21/22   Ladene Artist, MD  sertraline (ZOLOFT) 100 MG tablet Take 1 tablet (100 mg total) by mouth daily. 02/22/23   Mozingo, Thereasa Solo, NP  triamcinolone  (KENALOG) 0.1 % paste USE AS DIRECTED IN THE MOUTH OR THROAT 2 (TWO) TIMES DAILY AS NEEDED (ORAL IRRITATION). 04/06/23   Ladene Artist, MD    Family History Family History  Problem Relation Age of Onset   Leukemia Mother        dx 40s   Rectal cancer Mother        loss of MLH1/PMS2   Lung cancer Father        dx and d. 30s; smoking hx   Ovarian cancer Maternal Aunt        d. after 77   Colon cancer Maternal Uncle        mother's pat half brother; dx unknown age   Lung cancer Half-Brother        mat half brother    Social History Social History   Tobacco Use   Smoking status: Never    Passive exposure: Past   Smokeless tobacco: Never  Vaping Use   Vaping status: Never Used  Substance Use Topics   Alcohol use: No   Drug use: No     Allergies   Chlorthalidone, Losartan potassium-hctz, Cyclobenzaprine, Gabapentin, and Prednisone   Review of Systems Review of Systems  Constitutional:  Positive for activity change. Negative for appetite change, fatigue and fever.  HENT:  Positive for congestion and sore throat. Negative for sinus pressure and sneezing.   Respiratory:  Positive for cough and shortness of breath.   Cardiovascular:  Negative for chest pain.  Gastrointestinal:  Negative for abdominal pain, diarrhea, nausea and vomiting.  Musculoskeletal:  Positive for joint swelling.  Neurological:  Negative for dizziness, light-headedness and headaches.     Physical Exam Triage Vital Signs ED Triage Vitals  Encounter Vitals Group     BP 08/20/23 1751 118/71     Systolic BP Percentile --      Diastolic BP Percentile --      Pulse Rate 08/20/23 1751 84     Resp 08/20/23 1751 20     Temp 08/20/23 1751 99.2 F (37.3 C)     Temp Source 08/20/23 1751 Oral     SpO2 08/20/23 1751 99 %     Weight 08/20/23 1749 243 lb (110.2 kg)     Height 08/20/23 1749 5\' 6"  (1.676 m)     Head Circumference --      Peak Flow --      Pain Score 08/20/23 1746 3     Pain Loc --       Pain Education --      Exclude from Growth Chart --    No data found.  Updated Vital Signs BP 118/71 (BP Location: Right Arm)   Pulse 84   Temp 99.2 F (37.3 C) (Oral)   Resp 20   Ht 5\' 6"  (1.676 m)   Wt 243 lb (110.2 kg)   SpO2 99%   BMI 39.22 kg/m   Visual Acuity Right Eye Distance:   Left Eye Distance:   Bilateral Distance:    Right Eye Near:   Left Eye Near:    Bilateral Near:     Physical Exam Vitals reviewed.  Constitutional:      General: He is awake.     Appearance: Normal appearance. He is well-developed. He is not ill-appearing.     Comments: Very pleasant male appears stated age in no acute distress sitting comfortably in exam room  HENT:     Head: Normocephalic and atraumatic.     Right Ear: Tympanic membrane, ear canal and external ear normal. Tympanic membrane is not erythematous or bulging.     Left Ear: Tympanic membrane, ear canal and external ear normal. Tympanic membrane is not erythematous or bulging.     Nose: Nose normal.     Mouth/Throat:     Pharynx: Uvula midline. Posterior oropharyngeal erythema present. No oropharyngeal exudate or uvula swelling.  Cardiovascular:     Rate and Rhythm: Normal rate and regular rhythm.     Heart sounds: Normal heart sounds, S1 normal and S2 normal. No murmur heard. Pulmonary:     Effort: Pulmonary effort is normal. No accessory muscle usage or respiratory distress.     Breath sounds: No stridor. Decreased breath sounds present. No wheezing, rhonchi or rales.     Comments: Decreased breath sounds throughout lung fields but no adventitious lung sounds on exam. Abdominal:     General: Bowel sounds are normal.     Palpations: Abdomen  is soft.     Tenderness: There is no abdominal tenderness.  Neurological:     Mental Status: He is alert.  Psychiatric:        Behavior: Behavior is cooperative.      UC Treatments / Results  Labs (all labs ordered are listed, but only abnormal results are displayed) Labs  Reviewed - No data to display  EKG   Radiology No results found.  Procedures Procedures (including critical care time)  Medications Ordered in UC Medications - No data to display  Initial Impression / Assessment and Plan / UC Course  I have reviewed the triage vital signs and the nursing notes.  Pertinent labs & imaging results that were available during my care of the patient were reviewed by me and considered in my medical decision making (see chart for details).     Patient is well-appearing, afebrile, nontoxic, nontachycardic.  No indication for viral testing as he has been symptomatic for several weeks and this would not change management.  Chest x-ray was obtained given his history of active cancer treatment with persistent/worsening cough.  Based on my primary read there is peribronchial thickening but no evidence of focal consolidation.  At the time of discharge we were waiting for radiologist over read and we will contact him if this differs and changes our treatment plan.  Given prolonged and worsening symptoms will cover with doxycycline twice daily for 10 days to cover for both sinuses as well as atypical pneumonia as there is an increase in mycoplasma pneumonia in the community currently.  We discussed that he should avoid prolonged sun exposure while on this medication due to associated photosensitivity.  He was given Promethazine DM for cough.  We discussed that this can be sedating and he is not to drive or drink alcohol with taking it.  He can use over-the-counter medications for additional symptom relief.  Recommend close follow-up with his primary care.  Discussed that if anything worsens and he develops a fever or has worsening cough, chest pain, shortness of breath, nausea/vomiting he needs to be seen emergently.  Strict return precautions given.  All questions were answered to patient satisfaction.  Final Clinical Impressions(s) / UC Diagnoses   Final diagnoses:  Acute  cough  Sinobronchitis     Discharge Instructions      I did not see any evidence of pneumonia on your x-ray but we will contact you if the radiologist sees something else and we need to change your treatment plan.  Start doxycycline 100 mg twice daily for 10 days.  Stay out of the sun while on this medication as it can cause you to have a sunburn.  Continue over-the-counter medications including Mucinex, Tylenol, Flonase, nasal saline/sinus rinses.  Take Promethazine DM for cough.  This will make you sleepy so do not drive or drink alcohol while taking it.  If your symptoms or not improving within a week please return for reevaluation.  If anything worsens and you have chest pain, shortness of breath, worsening cough, weakness, nausea, vomiting you should be seen immediately.     ED Prescriptions     Medication Sig Dispense Auth. Provider   doxycycline (VIBRAMYCIN) 100 MG capsule Take 1 capsule (100 mg total) by mouth 2 (two) times daily. 20 capsule Gloria Lambertson K, PA-C   promethazine-dextromethorphan (PROMETHAZINE-DM) 6.25-15 MG/5ML syrup Take 5 mLs by mouth 2 (two) times daily as needed for cough. 118 mL Ashia Dehner, Noberto Retort, PA-C      PDMP  not reviewed this encounter.   Jeani Hawking, PA-C 08/20/23 1913

## 2023-08-21 ENCOUNTER — Encounter: Payer: Self-pay | Admitting: Oncology

## 2023-08-23 ENCOUNTER — Other Ambulatory Visit: Payer: Self-pay | Admitting: Adult Health

## 2023-08-23 DIAGNOSIS — F411 Generalized anxiety disorder: Secondary | ICD-10-CM

## 2023-08-23 DIAGNOSIS — F410 Panic disorder [episodic paroxysmal anxiety] without agoraphobia: Secondary | ICD-10-CM

## 2023-08-24 NOTE — Telephone Encounter (Signed)
 Pt is scheduled for 09/03/23.

## 2023-08-24 NOTE — Telephone Encounter (Signed)
Lf 118/ lv 06/27 due back in 6 months. Requested for appt to be made.

## 2023-08-24 NOTE — Telephone Encounter (Signed)
Please schedule pt an appt . Lv 06/27 due back in 6 months,.

## 2023-08-26 DIAGNOSIS — C182 Malignant neoplasm of ascending colon: Secondary | ICD-10-CM | POA: Diagnosis not present

## 2023-08-27 ENCOUNTER — Encounter: Payer: Self-pay | Admitting: Oncology

## 2023-08-29 ENCOUNTER — Encounter: Payer: Self-pay | Admitting: Oncology

## 2023-08-30 ENCOUNTER — Encounter: Payer: Self-pay | Admitting: Nurse Practitioner

## 2023-08-30 ENCOUNTER — Inpatient Hospital Stay: Payer: 59 | Attending: Oncology

## 2023-08-30 ENCOUNTER — Inpatient Hospital Stay: Payer: 59

## 2023-08-30 ENCOUNTER — Encounter: Payer: Self-pay | Admitting: Oncology

## 2023-08-30 ENCOUNTER — Inpatient Hospital Stay: Payer: 59 | Admitting: Nurse Practitioner

## 2023-08-30 VITALS — BP 117/67 | HR 80 | Temp 98.0°F | Resp 18

## 2023-08-30 VITALS — BP 123/73 | HR 84 | Temp 98.1°F | Resp 18 | Ht 66.0 in | Wt 242.3 lb

## 2023-08-30 DIAGNOSIS — C182 Malignant neoplasm of ascending colon: Secondary | ICD-10-CM | POA: Insufficient documentation

## 2023-08-30 DIAGNOSIS — F410 Panic disorder [episodic paroxysmal anxiety] without agoraphobia: Secondary | ICD-10-CM | POA: Diagnosis not present

## 2023-08-30 DIAGNOSIS — I10 Essential (primary) hypertension: Secondary | ICD-10-CM | POA: Insufficient documentation

## 2023-08-30 DIAGNOSIS — Z79899 Other long term (current) drug therapy: Secondary | ICD-10-CM | POA: Diagnosis not present

## 2023-08-30 DIAGNOSIS — C189 Malignant neoplasm of colon, unspecified: Secondary | ICD-10-CM

## 2023-08-30 DIAGNOSIS — D509 Iron deficiency anemia, unspecified: Secondary | ICD-10-CM | POA: Diagnosis not present

## 2023-08-30 DIAGNOSIS — K219 Gastro-esophageal reflux disease without esophagitis: Secondary | ICD-10-CM | POA: Insufficient documentation

## 2023-08-30 DIAGNOSIS — Z5111 Encounter for antineoplastic chemotherapy: Secondary | ICD-10-CM | POA: Insufficient documentation

## 2023-08-30 DIAGNOSIS — C787 Secondary malignant neoplasm of liver and intrahepatic bile duct: Secondary | ICD-10-CM | POA: Diagnosis not present

## 2023-08-30 DIAGNOSIS — F32A Depression, unspecified: Secondary | ICD-10-CM | POA: Insufficient documentation

## 2023-08-30 DIAGNOSIS — E785 Hyperlipidemia, unspecified: Secondary | ICD-10-CM | POA: Diagnosis not present

## 2023-08-30 DIAGNOSIS — Z95828 Presence of other vascular implants and grafts: Secondary | ICD-10-CM

## 2023-08-30 LAB — CBC WITH DIFFERENTIAL (CANCER CENTER ONLY)
Abs Immature Granulocytes: 0.01 10*3/uL (ref 0.00–0.07)
Basophils Absolute: 0.1 10*3/uL (ref 0.0–0.1)
Basophils Relative: 1 %
Eosinophils Absolute: 0.1 10*3/uL (ref 0.0–0.5)
Eosinophils Relative: 3 %
HCT: 33.7 % — ABNORMAL LOW (ref 39.0–52.0)
Hemoglobin: 10.1 g/dL — ABNORMAL LOW (ref 13.0–17.0)
Immature Granulocytes: 0 %
Lymphocytes Relative: 19 %
Lymphs Abs: 0.9 10*3/uL (ref 0.7–4.0)
MCH: 23.1 pg — ABNORMAL LOW (ref 26.0–34.0)
MCHC: 30 g/dL (ref 30.0–36.0)
MCV: 77.1 fL — ABNORMAL LOW (ref 80.0–100.0)
Monocytes Absolute: 0.4 10*3/uL (ref 0.1–1.0)
Monocytes Relative: 8 %
Neutro Abs: 3.4 10*3/uL (ref 1.7–7.7)
Neutrophils Relative %: 69 %
Platelet Count: 221 10*3/uL (ref 150–400)
RBC: 4.37 MIL/uL (ref 4.22–5.81)
RDW: 18 % — ABNORMAL HIGH (ref 11.5–15.5)
WBC Count: 4.9 10*3/uL (ref 4.0–10.5)
nRBC: 0 % (ref 0.0–0.2)

## 2023-08-30 LAB — CMP (CANCER CENTER ONLY)
ALT: 23 U/L (ref 0–44)
AST: 32 U/L (ref 15–41)
Albumin: 4.4 g/dL (ref 3.5–5.0)
Alkaline Phosphatase: 143 U/L — ABNORMAL HIGH (ref 38–126)
Anion gap: 6 (ref 5–15)
BUN: 13 mg/dL (ref 6–20)
CO2: 28 mmol/L (ref 22–32)
Calcium: 9.1 mg/dL (ref 8.9–10.3)
Chloride: 102 mmol/L (ref 98–111)
Creatinine: 0.72 mg/dL (ref 0.61–1.24)
GFR, Estimated: >60 mL/min (ref >60)
Glucose, Bld: 178 mg/dL — ABNORMAL HIGH (ref 70–99)
Potassium: 4.2 mmol/L (ref 3.5–5.1)
Sodium: 136 mmol/L (ref 135–145)
Total Bilirubin: 0.9 mg/dL (ref 0.0–1.2)
Total Protein: 8 g/dL (ref 6.5–8.1)

## 2023-08-30 LAB — CEA (ACCESS): CEA (CHCC): 8.99 ng/mL — ABNORMAL HIGH (ref 0.00–5.00)

## 2023-08-30 LAB — TOTAL PROTEIN, URINE DIPSTICK: Protein, ur: NEGATIVE mg/dL

## 2023-08-30 MED ORDER — SODIUM CHLORIDE 0.9 % IV SOLN
1600.0000 mg/m2 | INTRAVENOUS | Status: DC
  Administered 2023-08-30: 3500 mg via INTRAVENOUS
  Filled 2023-08-30: qty 70

## 2023-08-30 MED ORDER — SODIUM CHLORIDE 0.9% FLUSH
10.0000 mL | INTRAVENOUS | Status: DC | PRN
  Administered 2023-08-30: 10 mL via INTRAVENOUS

## 2023-08-30 MED ORDER — FOSAPREPITANT DIMEGLUMINE INJECTION 150 MG
150.0000 mg | Freq: Once | INTRAVENOUS | Status: AC
  Administered 2023-08-30: 150 mg via INTRAVENOUS
  Filled 2023-08-30: qty 150

## 2023-08-30 MED ORDER — ATROPINE SULFATE 1 MG/ML IV SOLN
0.5000 mg | Freq: Once | INTRAVENOUS | Status: AC | PRN
  Administered 2023-08-30: 0.5 mg via INTRAVENOUS

## 2023-08-30 MED ORDER — SODIUM CHLORIDE 0.9 % IV SOLN
5.0000 mg/kg | Freq: Once | INTRAVENOUS | Status: AC
  Administered 2023-08-30: 500 mg via INTRAVENOUS
  Filled 2023-08-30: qty 16

## 2023-08-30 MED ORDER — SODIUM CHLORIDE 0.9 % IV SOLN: Freq: Once | INTRAVENOUS | Status: AC

## 2023-08-30 MED ORDER — FLUOROURACIL CHEMO INJECTION 500 MG/10ML
200.0000 mg/m2 | Freq: Once | INTRAVENOUS | Status: AC
  Administered 2023-08-30: 450 mg via INTRAVENOUS
  Filled 2023-08-30: qty 9

## 2023-08-30 MED ORDER — DEXAMETHASONE SODIUM PHOSPHATE 10 MG/ML IJ SOLN
10.0000 mg | Freq: Once | INTRAMUSCULAR | Status: AC
  Administered 2023-08-30: 10 mg via INTRAVENOUS
  Filled 2023-08-30: qty 1

## 2023-08-30 MED ORDER — SODIUM CHLORIDE 0.9 % IV SOLN
180.0000 mg/m2 | Freq: Once | INTRAVENOUS | Status: AC
  Administered 2023-08-30: 400 mg via INTRAVENOUS
  Filled 2023-08-30: qty 15

## 2023-08-30 MED ORDER — SODIUM CHLORIDE 0.9 % IV SOLN
200.0000 mg/m2 | Freq: Once | INTRAVENOUS | Status: AC
  Administered 2023-08-30: 444 mg via INTRAVENOUS
  Filled 2023-08-30: qty 22.2

## 2023-08-30 MED ORDER — PALONOSETRON HCL INJECTION 0.25 MG/5ML
0.2500 mg | Freq: Once | INTRAVENOUS | Status: AC
  Administered 2023-08-30: 0.25 mg via INTRAVENOUS
  Filled 2023-08-30: qty 5

## 2023-08-30 NOTE — Progress Notes (Signed)
  [facility] Center OFFICE PROGRESS NOTE   Diagnosis: Colon cancer  INTERVAL HISTORY:   Anthony Calhoun returns as scheduled.  He completed cycle 11 FOLFIRI/Avastin  08/01/2023.  Chemotherapy was held per his request 08/15/2023.  He was seen in the emergency department 08/20/2023 due to an ongoing cough.  Chest x-ray was negative.  He was prescribed a course of doxycycline .  Symptoms have improved.  He continues to have intermittent sinus-like headaches.  The headaches tend to resolve with Tylenol  or rest.  No diplopia.  He has periodic nausea.  No vomiting.  No mouth sores.  No diarrhea.  No bleeding.  He notes an alteration in taste.  Objective:  Vital signs in last 24 hours:  Blood pressure 123/73, pulse 84, temperature 98.1 F (36.7 C), temperature source Temporal, resp. rate 18, height 5' 6 (1.676 m), weight 242 lb 4.8 oz (109.9 kg), SpO2 98%.    HEENT: No thrush or ulcers. Resp: Lungs clear bilaterally. Cardio: Regular rate and rhythm. GI: No hepatosplenomegaly. Vascular: No leg edema. Skin: Palms without erythema. Port-A-Cath without erythema.  Lab Results:  Lab Results  Component Value Date   WBC 4.9 08/30/2023   HGB 10.1 (L) 08/30/2023   HCT 33.7 (L) 08/30/2023   MCV 77.1 (L) 08/30/2023   PLT 221 08/30/2023   NEUTROABS 3.4 08/30/2023    Imaging:  No results found.  Medications: I have reviewed the patient's current medications.  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  secondary to nausea (patient discontinued Xeloda  on day 11 due to colonoscopy) Colonoscopy 07/09/2020-nodular mucosa at the colonic anastomosis, biopsied; patent end-to-side ileocolonic anastomosis characterized by healthy-appearing mucosa and visible sutures; nonbleeding internal hemorrhoids, biopsy with benign ulcerated anastomotic mucosa with granulation tissue Cycle 4 adjuvant Xeloda  07/19/2020, 2000 mg every morning and 1500 mg every afternoon Cycle 5 adjuvant Xeloda  08/09/2020 Cycle 6 adjuvant Xeloda  08/31/2019 Cycle 7 adjuvant Xeloda  09/20/2020 Cycle 8 adjuvant Xeloda  10/11/2020 CTs 03/07/2021-no evidence of recurrent disease, hepatic steatosis, slight wall thickening at the junction of the descending and horizontal duodenum Mild elevation of CEA 2023 12/01/2021-Guardant Reveal-ct DNA detected PET 12/30/2021-hypermetabolic right liver lesion, hypermetabolic area in a loop of mid small bowel with an SUV of 12.5, review of PET images at GI tumor conference 01/11/2022-small bowel uptake felt to be a benign finding MRI liver 01/10/2022-solitary 1.2 cm right liver lesion between segments 7 and 8 compatible with metastasis, subtle changes suggesting cirrhosis, hepatic steatosis CT enteroscopy 02/02/2022-small bowel loop with hypermetabolic activity on PET continues to have a bandlike density along the margin felt to represent a diverticulum or adjacent nodal tissue.  Small bowel tumor not excluded.  3-4 mm left omental nodule 02/08/2022-biopsy and ablation of solitary liver lesion, adenocarcinoma consistent with metastatic colorectal adenocarcinoma CTs 03/31/2022-unchanged circumstantial soft tissue thickening surrounding a loop of mid to distal small bowel with an adjacent nodular focus measuring 1.2 cm.,  Ablation cavity in segment 7, no other evidence of metastatic disease Diagnostic laparoscopy 05/26/2022-mid small bowel mass-resected, well to moderately differentiated adenocarcinoma, 0/2 lymph nodes, morphology  consistent with a colon primary, tumor involved full-thickness including  serosa with perineural and large vessel involvement; foundation 1-microsatellite stable, tumor mutation burden 2, K-ras Q61H CT abdomen/pelvis 07/10/2022-the right liver ablation defect is smaller, new subtle right liver lesion measuring 13 x 9 mm, enlarged lymph node in the right upper quadrant mesentery adjacent to surgical anastomosis PET 07/26/2022-new segment 6 liver lesion, enlarging hypermetabolic left omental lesion, there is another mildly hypermetabolic peritoneal implant, 7 mm omental nodule at the midline without hypermetabolism Cycle 1 FOLFOX 09/25/2022 Cycle 2 FOLFOX/bevacizumab  10/09/2022, 5-FU dose reduced secondary to mucositis Cycle 3 FOLFOX/bevacizumab  10/23/2022 Cycle 4 FOLFOX/bevacizumab  11/06/2022 Cycle 5 FOLFOX/bevacizumab  11/20/2022 Cycle 6 FOLFOX/bevacizumab  12/04/2022 CT abdomen/pelvis 12/14/2022-further contraction of the treated segment 7 lesion, slight enlargement of segment 6 lesion, no new liver lesion, increased size of ileocolic nodes and an omental lesion, stable small retroperitoneal nodes Cycle 7 FOLFOX/bevacizumab  12/18/2022 Cycle 8 FOLFOX/bevacizumab  01/02/2023, oxaliplatin  dose reduced secondary to prolonged nausea and neuropathy symptoms, Emend added Cycle 9 FOLFOX/bevacizumab  01/16/2023, oxaliplatin  discontinued secondary to neuropathy Cycle 1 FOLFIRI 02/19/2023, Avastin  held due to recent dental extractions Cycle 2 FOLFIRI/Avastin  03/05/2023 Cycle 3 FOLFIRI/Avastin  03/19/2023, 5-FU bolus held due to mucositis after cycle 2, Fulphila  added Cycle 4 FOLFIRI/Avastin  04/02/2023, Fulphila , Emend Cycle 5 FOLFIRI/Avastin  04/16/2023, Fulphila , Emend CTs 05/02/2023-similar ileocolic mesenteric adenopathy and omental metastasis, cirrhosis with portal hypertension, stable liver lesions, no new or progressive disease Cycle 6 FOLFIRI/Avastin  05/07/2023, Fulphila , Emend Cycle 7 FOLFIRI/Avastin  05/21/2023, Fulphila ,  Emend Cycle 8 FOLFIRI 06/04/2023, Fulphila , Emend; Avastin  held Cycle 9 FOLFIRI 06/18/2023, Fulphila , Emend, Avastin  held Treatment held 07/09/2023 patient request Cycle 10 FOLFIRI 07/18/2023, Fulphila , Emend, Avastin  held CTs 07/31/2023: Stable with unchanged hepatic lesions, mesenteric lymph nodes, and left omental nodule, unchanged morphologic changes of cirrhosis and portal hypertension Cycle 11 FOLFIRI/Avastin  08/01/2023, Fulphila  Chemotherapy held 08/15/2023 per patient request Cycle 12 FOLFIRI/Avastin  08/30/2023, Fulphila  Microcytic anemia secondary to #1-improved Colon polyps-sessile serrated adenoma of the descending colon, tubular adenomas with focal high-grade dysplasia of the transverse colon on colonoscopy 02/12/2020, tubular adenoma of the mid ascending colon on the surgical specimen 04/12/2020 Family history of colon cancer Depression Anxiety/panic attacks Hypertension Hyperlipidemia Nausea secondary to Xeloda ?-Resolved Gastroesophageal reflux disease-improved following discontinuation of Xeloda  Anemia, microcytic; ferritin 6 01/24/2022; stool positive for blood x3 01/25/2022; 02/02/2022 CT abdomen/pelvis enterography-bowel loop demonstrating hypermetabolic activity continues to have a bandlike density along its margin possibly representing a diverticulum or adjacent nodal tissue, difficult to exclude small bowel tumor, 3 x 4 mm left omental nodule or lymph node, known right hepatic lobe tumor is occult on CT. Venofer  weekly x3 beginning 03/03/2022 Negative capsule endoscopy 03/09/2022 Venofer  03/03/2022 for 3 weekly doses Venofer  05/03/2022, 05/19/2022 Venofer  07/28/2022, 08/04/2022 Venofer  03/22/2023, 03/29/2023, 04/04/2023 Venofer  06/18/2023, 07/09/2023 07/03/2023 upper endoscopy and colonoscopy-severe portal hypertensive gastropathy in the gastric fundus, gastric body, stomach, red blood found greater curvature of the stomach and gastric antrum, treated with APC; colonoscopy showed dark old  maroon-colored blood in the entire colon.  No evidence of active or recent bleeding in the colon. 12.  Anterior left neck nodule 03/17/2022-cyst?,  Lymph node? 13.  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  Oxaliplatin  neuropathy-mild loss of vibratory sense 12/04/2022, 12/18/2022, 01/02/2023  Disposition: Anthony Calhoun appears stable.  He is on active treatment with FOLFIRI/Avastin .  Most recent CEA was better.  There is no clinical evidence of disease progression.  Plan to proceed with another cycle of FOLFIRI/Avastin  today.  CBC and chemistry panel reviewed.  Labs adequate to proceed as above.  He will return for follow-up and treatment in 2 weeks.    [name] ANP/GNP-BC   08/30/2023  11:00 AM

## 2023-08-30 NOTE — Patient Instructions (Addendum)
 [facility]  Discharge Instructions: Thank you for choosing [facility] to provide your oncology and hematology care.   If you have a lab appointment with the Cancer Center, please go directly to the Cancer Center and check in at the registration area.   Wear comfortable clothing and clothing appropriate for easy access to any Portacath or PICC line.   We strive to give you quality time with your provider. You may need to reschedule your appointment if you arrive late (15 or more minutes).  Arriving late affects you and other patients whose appointments are after yours.  Also, if you miss three or more appointments without notifying the office, you may be dismissed from the clinic at the provider's discretion.      For prescription refill requests, have your pharmacy contact our office and allow 72 hours for refills to be completed.    Today you received the following chemotherapy and/or immunotherapy agents Bevacizumab-awwb, Irinotecan, Leucovorin and Fluorouracil.      To help prevent nausea and vomiting after your treatment, we encourage you to take your nausea medication as directed.  BELOW ARE SYMPTOMS THAT SHOULD BE REPORTED IMMEDIATELY: *FEVER GREATER THAN 100.4 F (38 C) OR HIGHER *CHILLS OR SWEATING *NAUSEA AND VOMITING THAT IS NOT CONTROLLED WITH YOUR NAUSEA MEDICATION *UNUSUAL SHORTNESS OF BREATH *UNUSUAL BRUISING OR BLEEDING *URINARY PROBLEMS (pain or burning when urinating, or frequent urination) *BOWEL PROBLEMS (unusual diarrhea, constipation, pain near the anus) TENDERNESS IN MOUTH AND THROAT WITH OR WITHOUT PRESENCE OF ULCERS (sore throat, sores in mouth, or a toothache) UNUSUAL RASH, SWELLING OR PAIN  UNUSUAL VAGINAL DISCHARGE OR ITCHING   Items with * indicate a potential emergency and should be followed up as soon as possible or go to the Emergency Department if any problems should occur.  Please  show the CHEMOTHERAPY ALERT CARD or IMMUNOTHERAPY ALERT CARD at check-in to the Emergency Department and triage nurse.  Should you have questions after your visit or need to cancel or reschedule your appointment, please contact [facility]  Dept: [phone]  and follow the prompts.  Office hours are 8:00 a.m. to 4:30 p.m. Monday - Friday. Please note that voicemails left after 4:00 p.m. may not be returned until the following business day.  We are closed weekends and major holidays. You have access to a nurse at all times for urgent questions. Please call the main number to the clinic Dept: [phone] and follow the prompts.   For any non-urgent questions, you may also contact your provider using MyChart. We now offer e-Visits for anyone 24 and older to request care online for non-urgent symptoms. For details visit [email or web address].   Also download the MyChart app! Go to the app store, search "MyChart", open the app, select [facility], and log in with your MyChart username and password.

## 2023-08-30 NOTE — Progress Notes (Signed)
 Patient seen by [name] NP today  Vitals are within treatment parameters:Yes   Labs are within treatment parameters: Yes   Treatment plan has been signed: Yes   Per physician team, Patient is ready for treatment and there are NO modifications to the treatment plan.

## 2023-08-31 ENCOUNTER — Other Ambulatory Visit: Payer: Self-pay

## 2023-08-31 ENCOUNTER — Encounter: Payer: Self-pay | Admitting: Oncology

## 2023-09-01 ENCOUNTER — Inpatient Hospital Stay: Payer: 59

## 2023-09-01 VITALS — BP 138/73 | HR 85 | Temp 97.5°F | Resp 17 | Ht 66.0 in

## 2023-09-01 DIAGNOSIS — C189 Malignant neoplasm of colon, unspecified: Secondary | ICD-10-CM

## 2023-09-01 DIAGNOSIS — Z79899 Other long term (current) drug therapy: Secondary | ICD-10-CM | POA: Diagnosis not present

## 2023-09-01 DIAGNOSIS — Z5111 Encounter for antineoplastic chemotherapy: Secondary | ICD-10-CM | POA: Diagnosis not present

## 2023-09-01 DIAGNOSIS — E785 Hyperlipidemia, unspecified: Secondary | ICD-10-CM | POA: Diagnosis not present

## 2023-09-01 DIAGNOSIS — C182 Malignant neoplasm of ascending colon: Secondary | ICD-10-CM | POA: Diagnosis not present

## 2023-09-01 DIAGNOSIS — K219 Gastro-esophageal reflux disease without esophagitis: Secondary | ICD-10-CM | POA: Diagnosis not present

## 2023-09-01 DIAGNOSIS — I10 Essential (primary) hypertension: Secondary | ICD-10-CM | POA: Diagnosis not present

## 2023-09-01 DIAGNOSIS — D509 Iron deficiency anemia, unspecified: Secondary | ICD-10-CM | POA: Diagnosis not present

## 2023-09-01 MED ORDER — PEGFILGRASTIM-CBQV 6 MG/0.6ML ~~LOC~~ SOSY
6.0000 mg | PREFILLED_SYRINGE | Freq: Once | SUBCUTANEOUS | Status: AC
  Administered 2023-09-01: 6 mg via SUBCUTANEOUS
  Filled 2023-09-01: qty 0.6

## 2023-09-01 MED ORDER — HEPARIN SOD (PORK) LOCK FLUSH 100 UNIT/ML IV SOLN
500.0000 [IU] | Freq: Once | INTRAVENOUS | Status: AC | PRN
  Administered 2023-09-01: 500 [IU]

## 2023-09-01 MED ORDER — SODIUM CHLORIDE 0.9% FLUSH
10.0000 mL | INTRAVENOUS | Status: DC | PRN
  Administered 2023-09-01: 10 mL

## 2023-09-03 ENCOUNTER — Telehealth: Payer: Self-pay | Admitting: *Deleted

## 2023-09-03 ENCOUNTER — Ambulatory Visit: Payer: Self-pay | Admitting: Adult Health

## 2023-09-03 ENCOUNTER — Encounter: Payer: Self-pay | Admitting: Oncology

## 2023-09-03 MED ORDER — FLUCONAZOLE 100 MG PO TABS: 100.0000 mg | ORAL_TABLET | Freq: Every day | ORAL | 0 refills | Status: AC

## 2023-09-03 NOTE — Telephone Encounter (Addendum)
 Has developed thrush on his tongue-heaving white coating. Has already been using his MMW.  Also says he is still having some headache over his right eye and temple area and asking if this is related? Per Dr. Cloretta: Fluconazole  100 mg daily x 5 days. Script sent and LVM on his phone w/new order.

## 2023-09-03 NOTE — Progress Notes (Signed)
 Patient no show appointment. ? ?

## 2023-09-05 ENCOUNTER — Other Ambulatory Visit: Payer: Medicare HMO

## 2023-09-05 ENCOUNTER — Ambulatory Visit: Payer: Medicare HMO | Admitting: Oncology

## 2023-09-05 ENCOUNTER — Ambulatory Visit: Payer: Medicare HMO

## 2023-09-11 ENCOUNTER — Ambulatory Visit (INDEPENDENT_AMBULATORY_CARE_PROVIDER_SITE_OTHER): Payer: 59 | Admitting: Adult Health

## 2023-09-11 ENCOUNTER — Encounter: Payer: Self-pay | Admitting: Oncology

## 2023-09-11 DIAGNOSIS — Z0389 Encounter for observation for other suspected diseases and conditions ruled out: Secondary | ICD-10-CM

## 2023-09-11 NOTE — Progress Notes (Deleted)
 Anthony Calhoun [id number] 18-Dec-1965 58 y.o.  Subjective:   Patient ID:  Anthony Calhoun is a 58 y.o. (DOB [birth year]) male.  Chief Complaint: No chief complaint on file.   HPI Anthony Calhoun presents to the office today for follow-up of ***   PHQ2-9    Flowsheet Row Social Work from 01/17/2022 in [facility]  PHQ-2 Total Score 2  PHQ-9 Total Score 3      Flowsheet Row ED from 08/20/2023 in [facility] Admission (Discharged) from 07/03/2023 in [facility] ENDOSCOPY Admission (Discharged) from 05/26/2022 in [facility]  C-SSRS RISK CATEGORY No Risk No Risk No Risk        Review of Systems:  Review of Systems  Medications: {medication reviewed/display:3041432}  Current Outpatient Medications  Medication Sig Dispense Refill   Accu-Chek Softclix Lancets lancets SMARTSIG:Topical     acetaminophen  (TYLENOL ) 500 MG tablet Take 500 mg by mouth every 6 (six) hours as needed.     ALPRAZolam  (XANAX ) 1 MG tablet TAKE 1 TABLET BY MOUTH 3 TIMES A DAY AS NEEDED FOR ANXIETY 90 tablet 2   amLODipine  (NORVASC ) 5 MG tablet Take 5 mg by mouth daily.     Bempedoic Acid (NEXLETOL) 180 MG TABS Take 180 mg by mouth daily.     carvedilol  (COREG ) 6.25 MG tablet Take 6.25 mg by mouth 2 (two) times daily with a meal.     doxycycline  (VIBRAMYCIN ) 100 MG capsule Take 1 capsule (100 mg total) by mouth 2 (two) times daily. 20 capsule 0   HYDROcodone -acetaminophen  (NORCO) 5-325 MG tablet Take 1 tablet by mouth every 12 (twelve) hours as needed for moderate pain. (Patient not taking: Reported on 08/30/2023) 30 tablet 0   LANTUS SOLOSTAR 100 UNIT/ML Solostar Pen Inject 5 Units into the skin daily. Taper up daily     lidocaine -prilocaine  (EMLA ) cream Apply 1 Application topically as needed. Apply 1/2 tablespoon to port site 1-2 hours prior to stick and cover w/Press-and-Seal to numb  site for port access 30 g 0   magic mouthwash SOLN Take 5 mLs by mouth 4 (four) times daily as needed for mouth pain (swish and spit -maximum of 20 ml/24 hours). 240 mL 1   ondansetron  (ZOFRAN ) 8 MG tablet Take 1 tablet (8 mg total) by mouth every 8 (eight) hours as needed for nausea. (Patient not taking: Reported on 08/01/2023) 30 tablet 1   pantoprazole  (PROTONIX ) 40 MG tablet TAKE 1 TABLET BY MOUTH EVERY DAY (Patient taking differently: Take 40 mg by mouth 2 (two) times daily.) 30 tablet 2   promethazine  (PHENERGAN ) 25 MG tablet TAKE 1 TABLET BY MOUTH EVERY 6 HOURS AS NEEDED FOR NAUSEA OR VOMITING. 60 tablet 1   promethazine -dextromethorphan (PROMETHAZINE -DM) 6.25-15 MG/5ML syrup Take 5 mLs by mouth 2 (two) times daily as needed for cough. 118 mL 0   sertraline  (ZOLOFT ) 100 MG tablet Take 1 tablet (100 mg total) by mouth daily. 90 tablet 3   triamcinolone  (KENALOG ) 0.1 % paste USE AS DIRECTED IN THE MOUTH OR THROAT 2 (TWO) TIMES DAILY AS NEEDED (ORAL IRRITATION). 5 g 1   No current facility-administered medications for this visit.   Facility-Administered Medications Ordered in Other Visits  Medication Dose Route Frequency Provider Last Rate Last Admin   sodium chloride  flush (NS) 0.9 % injection 10 mL  10 mL Intravenous PRN Cloretta Arley NOVAK, MD  10 mL at 08/15/23 1101    Medication Side Effects: {Medication Side Effects (Optional):21014029}  Allergies:  Allergies  Allergen Reactions   Chlorthalidone Swelling    Swelling of lips   Losartan Potassium-Hctz Swelling    Lip swelling   Cyclobenzaprine Other (See Comments)    Unknown reaction   Gabapentin  Nausea Only   Prednisone Anxiety    panic attack Panic attacks    Past Medical History:  Diagnosis Date   Anemia    Anxiety    Arthritis    back,    Cancer (HCC)    cecum   Depression    Diabetes mellitus without complication (HCC)    Dyspnea    started with anemia   GERD (gastroesophageal reflux disease)    History of  kidney stones    Hypertension    Neuromuscular disorder (HCC)    carpal tunnel   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   Please see review of systems for further details on the patient's review from today.   Objective:   Physical Exam:  There were no vitals taken for this visit.  Physical Exam  Lab Review:     Component Value Date/Time   NA 136 08/30/2023 0942   K 4.2 08/30/2023 0942   CL 102 08/30/2023 0942   CO2 28 08/30/2023 0942   GLUCOSE 178 (H) 08/30/2023 0942   BUN 13 08/30/2023 0942   CREATININE 0.72 08/30/2023 0942   CALCIUM  9.1 08/30/2023 0942   PROT 8.0 08/30/2023 0942   ALBUMIN 4.4 08/30/2023 0942   AST 32 08/30/2023 0942   ALT 23 08/30/2023 0942   ALKPHOS 143 (H) 08/30/2023 0942   BILITOT 0.9 08/30/2023 0942   GFRNONAA >60 08/30/2023 0942   GFRAA >60 05/10/2020 1543       Component Value Date/Time   WBC 4.9 08/30/2023 0942   WBC 8.4 05/27/2022 0527   RBC 4.37 08/30/2023 0942   HGB 10.1 (L) 08/30/2023 0942   HCT 33.7 (L) 08/30/2023 0942   PLT 221 08/30/2023 0942   MCV 77.1 (L) 08/30/2023 0942   MCH 23.1 (L) 08/30/2023 0942   MCHC 30.0 08/30/2023 0942   RDW 18.0 (H) 08/30/2023 0942   LYMPHSABS 0.9 08/30/2023 0942   MONOABS 0.4 08/30/2023 0942   EOSABS 0.1 08/30/2023 0942   BASOSABS 0.1 08/30/2023 0942    No results found for: POCLITH, LITHIUM   No results found for: PHENYTOIN, PHENOBARB, VALPROATE, CBMZ   .res Assessment: Plan:    There are no diagnoses linked to this encounter.   Please see After Visit Summary for patient specific instructions.  Future Appointments  Date Time Provider Department Center  09/11/2023  9:30 AM Zeynep Fantroy, Angeline Mattocks, NP [facility] None  09/19/2023  9:45 AM [facility] PHLEBOTOMIST [facility] None  09/19/2023 10:00 AM [facility] INFUSION [facility] None  09/19/2023 10:20 AM  Cloretta Arley NOVAK, MD [facility] None  09/19/2023 11:30 AM [facility] INFUSION [facility] None  09/21/2023  2:30 PM [facility] INFUSION [facility] None  10/01/2023  9:00 AM [facility] PHLEBOTOMIST [facility] None  10/01/2023  9:15 AM [facility] INFUSION [facility] None  10/01/2023  9:45 AM Debby Olam POUR, NP [facility] None  10/01/2023 11:00 AM [facility] INFUSION [facility] None  10/03/2023  2:00 PM [facility] INFUSION [facility] None    No orders of the defined types were placed in this encounter.   -------------------------------

## 2023-09-11 NOTE — Progress Notes (Signed)
 Patient no show appointment - sick from chemotherapy.

## 2023-09-13 ENCOUNTER — Inpatient Hospital Stay: Payer: 59 | Admitting: Oncology

## 2023-09-13 ENCOUNTER — Inpatient Hospital Stay: Payer: 59

## 2023-09-13 ENCOUNTER — Telehealth: Payer: Self-pay | Admitting: Adult Health

## 2023-09-13 NOTE — Telephone Encounter (Signed)
Anthony Calhoun reports that he has stage 4 cancer and has been pretty sick lately. He has had a respiratory infection. He would like to set up a virtual visit if that's possible. He restarts chemo next Wednesday.

## 2023-09-13 NOTE — Telephone Encounter (Signed)
Pt Lvm @ 1:28p for Almira Coaster to call him back.    No upcoming appt scheduled.

## 2023-09-14 ENCOUNTER — Encounter: Payer: Self-pay | Admitting: Oncology

## 2023-09-16 ENCOUNTER — Other Ambulatory Visit: Payer: Self-pay | Admitting: Oncology

## 2023-09-19 ENCOUNTER — Inpatient Hospital Stay: Payer: 59

## 2023-09-19 ENCOUNTER — Other Ambulatory Visit: Payer: Self-pay | Admitting: Oncology

## 2023-09-19 ENCOUNTER — Other Ambulatory Visit: Payer: Self-pay | Admitting: *Deleted

## 2023-09-19 ENCOUNTER — Inpatient Hospital Stay (HOSPITAL_BASED_OUTPATIENT_CLINIC_OR_DEPARTMENT_OTHER): Payer: 59 | Admitting: Oncology

## 2023-09-19 VITALS — BP 123/69 | HR 89 | Temp 98.1°F | Resp 18 | Ht 66.0 in | Wt 244.1 lb

## 2023-09-19 VITALS — BP 118/62 | HR 81

## 2023-09-19 DIAGNOSIS — C787 Secondary malignant neoplasm of liver and intrahepatic bile duct: Secondary | ICD-10-CM | POA: Diagnosis not present

## 2023-09-19 DIAGNOSIS — D509 Iron deficiency anemia, unspecified: Secondary | ICD-10-CM | POA: Diagnosis not present

## 2023-09-19 DIAGNOSIS — K219 Gastro-esophageal reflux disease without esophagitis: Secondary | ICD-10-CM | POA: Diagnosis not present

## 2023-09-19 DIAGNOSIS — E785 Hyperlipidemia, unspecified: Secondary | ICD-10-CM | POA: Diagnosis not present

## 2023-09-19 DIAGNOSIS — Z79899 Other long term (current) drug therapy: Secondary | ICD-10-CM | POA: Diagnosis not present

## 2023-09-19 DIAGNOSIS — C189 Malignant neoplasm of colon, unspecified: Secondary | ICD-10-CM | POA: Diagnosis not present

## 2023-09-19 DIAGNOSIS — C182 Malignant neoplasm of ascending colon: Secondary | ICD-10-CM | POA: Diagnosis not present

## 2023-09-19 DIAGNOSIS — I10 Essential (primary) hypertension: Secondary | ICD-10-CM | POA: Diagnosis not present

## 2023-09-19 DIAGNOSIS — Z5111 Encounter for antineoplastic chemotherapy: Secondary | ICD-10-CM | POA: Diagnosis not present

## 2023-09-19 LAB — CMP (CANCER CENTER ONLY)
ALT: 35 U/L (ref 0–44)
AST: 30 U/L (ref 15–41)
Albumin: 4.1 g/dL (ref 3.5–5.0)
Alkaline Phosphatase: 179 U/L — ABNORMAL HIGH (ref 38–126)
Anion gap: 7 (ref 5–15)
BUN: 9 mg/dL (ref 6–20)
CO2: 28 mmol/L (ref 22–32)
Calcium: 8.9 mg/dL (ref 8.9–10.3)
Chloride: 99 mmol/L (ref 98–111)
Creatinine: 0.83 mg/dL (ref 0.61–1.24)
GFR, Estimated: >60 mL/min (ref >60)
Glucose, Bld: 224 mg/dL — ABNORMAL HIGH (ref 70–99)
Potassium: 3.8 mmol/L (ref 3.5–5.1)
Sodium: 134 mmol/L — ABNORMAL LOW (ref 135–145)
Total Bilirubin: 0.8 mg/dL (ref 0.0–1.2)
Total Protein: 7.3 g/dL (ref 6.5–8.1)

## 2023-09-19 LAB — CBC WITH DIFFERENTIAL (CANCER CENTER ONLY)
Abs Immature Granulocytes: 0.03 10*3/uL (ref 0.00–0.07)
Basophils Absolute: 0.1 10*3/uL (ref 0.0–0.1)
Basophils Relative: 1 %
Eosinophils Absolute: 0.2 10*3/uL (ref 0.0–0.5)
Eosinophils Relative: 3 %
HCT: 33.8 % — ABNORMAL LOW (ref 39.0–52.0)
Hemoglobin: 9.9 g/dL — ABNORMAL LOW (ref 13.0–17.0)
Immature Granulocytes: 1 %
Lymphocytes Relative: 17 %
Lymphs Abs: 1 10*3/uL (ref 0.7–4.0)
MCH: 22 pg — ABNORMAL LOW (ref 26.0–34.0)
MCHC: 29.3 g/dL — ABNORMAL LOW (ref 30.0–36.0)
MCV: 75.3 fL — ABNORMAL LOW (ref 80.0–100.0)
Monocytes Absolute: 0.5 10*3/uL (ref 0.1–1.0)
Monocytes Relative: 8 %
Neutro Abs: 4.2 10*3/uL (ref 1.7–7.7)
Neutrophils Relative %: 70 %
Platelet Count: 209 10*3/uL (ref 150–400)
RBC: 4.49 MIL/uL (ref 4.22–5.81)
RDW: 19 % — ABNORMAL HIGH (ref 11.5–15.5)
WBC Count: 5.9 10*3/uL (ref 4.0–10.5)
nRBC: 0 % (ref 0.0–0.2)

## 2023-09-19 LAB — TOTAL PROTEIN, URINE DIPSTICK: Protein, ur: NEGATIVE mg/dL

## 2023-09-19 LAB — FERRITIN: Ferritin: 16 ng/mL — ABNORMAL LOW (ref 24–336)

## 2023-09-19 LAB — CEA (ACCESS): CEA (CHCC): 12.46 ng/mL — ABNORMAL HIGH (ref 0.00–5.00)

## 2023-09-19 MED ORDER — ATROPINE SULFATE 1 MG/ML IV SOLN
0.5000 mg | Freq: Once | INTRAVENOUS | Status: AC | PRN
  Administered 2023-09-19: 0.5 mg via INTRAVENOUS
  Filled 2023-09-19: qty 1

## 2023-09-19 MED ORDER — SODIUM CHLORIDE 0.9 % IV SOLN
150.0000 mg | Freq: Once | INTRAVENOUS | Status: AC
  Administered 2023-09-19: 150 mg via INTRAVENOUS
  Filled 2023-09-19: qty 150

## 2023-09-19 MED ORDER — SODIUM CHLORIDE 0.9 % IV SOLN
180.0000 mg/m2 | Freq: Once | INTRAVENOUS | Status: AC
  Administered 2023-09-19: 400 mg via INTRAVENOUS
  Filled 2023-09-19: qty 15

## 2023-09-19 MED ORDER — DEXAMETHASONE SODIUM PHOSPHATE 10 MG/ML IJ SOLN
10.0000 mg | Freq: Once | INTRAMUSCULAR | Status: AC
  Administered 2023-09-19: 10 mg via INTRAVENOUS
  Filled 2023-09-19: qty 1

## 2023-09-19 MED ORDER — SODIUM CHLORIDE 0.9 % IV SOLN: Freq: Once | INTRAVENOUS | Status: AC

## 2023-09-19 MED ORDER — FLUOROURACIL CHEMO INJECTION 500 MG/10ML
200.0000 mg/m2 | Freq: Once | INTRAVENOUS | Status: AC
  Administered 2023-09-19: 450 mg via INTRAVENOUS
  Filled 2023-09-19: qty 9

## 2023-09-19 MED ORDER — SODIUM CHLORIDE 0.9 % IV SOLN
1600.0000 mg/m2 | INTRAVENOUS | Status: DC
  Administered 2023-09-19: 3500 mg via INTRAVENOUS
  Filled 2023-09-19: qty 70

## 2023-09-19 MED ORDER — SODIUM CHLORIDE 0.9 % IV SOLN
5.0000 mg/kg | Freq: Once | INTRAVENOUS | Status: AC
  Administered 2023-09-19: 500 mg via INTRAVENOUS
  Filled 2023-09-19: qty 16

## 2023-09-19 MED ORDER — PALONOSETRON HCL INJECTION 0.25 MG/5ML
0.2500 mg | Freq: Once | INTRAVENOUS | Status: AC
  Administered 2023-09-19: 0.25 mg via INTRAVENOUS
  Filled 2023-09-19: qty 5

## 2023-09-19 MED ORDER — LEUCOVORIN CALCIUM INJECTION 350 MG
200.0000 mg/m2 | Freq: Once | INTRAMUSCULAR | Status: AC
  Administered 2023-09-19: 444 mg via INTRAVENOUS
  Filled 2023-09-19: qty 22.2

## 2023-09-19 NOTE — Progress Notes (Signed)
Patient seen by Dr. Thornton Papas today  Vitals are within treatment parameters:Yes   Labs are within treatment parameters: Yes   Treatment plan has been signed: Yes   Per physician team, Patient is ready for treatment and there are NO modifications to the treatment plan.

## 2023-09-19 NOTE — Patient Instructions (Signed)
[facility]  Discharge Instructions: Thank you for choosing [facility] to provide your oncology and hematology care.   If you have a lab appointment with the Cancer Center, please go directly to the Cancer Center and check in at the registration area.   Wear comfortable clothing and clothing appropriate for easy access to any Portacath or PICC line.   We strive to give you quality time with your provider. You may need to reschedule your appointment if you arrive late (15 or more minutes).  Arriving late affects you and other patients whose appointments are after yours.  Also, if you miss three or more appointments without notifying the office, you may be dismissed from the clinic at the provider's discretion.      For prescription refill requests, have your pharmacy contact our office and allow 72 hours for refills to be completed.    Today you received the following chemotherapy and/or immunotherapy agents Bevacizumab-awwb, Irinotecan, Leucovorin and Fluorouracil.      To help prevent nausea and vomiting after your treatment, we encourage you to take your nausea medication as directed.  BELOW ARE SYMPTOMS THAT SHOULD BE REPORTED IMMEDIATELY: *FEVER GREATER THAN 100.4 F (38 C) OR HIGHER *CHILLS OR SWEATING *NAUSEA AND VOMITING THAT IS NOT CONTROLLED WITH YOUR NAUSEA MEDICATION *UNUSUAL SHORTNESS OF BREATH *UNUSUAL BRUISING OR BLEEDING *URINARY PROBLEMS (pain or burning when urinating, or frequent urination) *BOWEL PROBLEMS (unusual diarrhea, constipation, pain near the anus) TENDERNESS IN MOUTH AND THROAT WITH OR WITHOUT PRESENCE OF ULCERS (sore throat, sores in mouth, or a toothache) UNUSUAL RASH, SWELLING OR PAIN  UNUSUAL VAGINAL DISCHARGE OR ITCHING   Items with * indicate a potential emergency and should be followed up as soon as possible or go to the Emergency Department if any problems should occur.  Please  show the CHEMOTHERAPY ALERT CARD or IMMUNOTHERAPY ALERT CARD at check-in to the Emergency Department and triage nurse.  Should you have questions after your visit or need to cancel or reschedule your appointment, please contact [facility]  Dept: [phone]  and follow the prompts.  Office hours are 8:00 a.m. to 4:30 p.m. Monday - Friday. Please note that voicemails left after 4:00 p.m. may not be returned until the following business day.  We are closed weekends and major holidays. You have access to a nurse at all times for urgent questions. Please call the main number to the clinic Dept: [phone] and follow the prompts.   For any non-urgent questions, you may also contact your provider using MyChart. We now offer e-Visits for anyone 24 and older to request care online for non-urgent symptoms. For details visit [email or web address].   Also download the MyChart app! Go to the app store, search "MyChart", open the app, select [facility], and log in with your MyChart username and password.

## 2023-09-19 NOTE — Patient Instructions (Signed)

## 2023-09-19 NOTE — Progress Notes (Signed)
[facility] OFFICE PROGRESS NOTE   Diagnosis: Colon cancer  INTERVAL HISTORY:   Anthony Calhoun completed another cycle of FOLFIRI/bevacizumab on 08/30/2023.  He reports nausea following chemotherapy.  No mouth sores.  He reports mild diarrhea.  His appetite has diminished.  He has discomfort at the right upper back.  He takes Tylenol as needed for pain.  Objective:  Vital signs in last 24 hours:  Blood pressure 123/69, pulse 89, temperature 98.1 F (36.7 C), temperature source Temporal, resp. rate 18, height 5\' 6"  (1.676 m), weight 244 lb 1.6 oz (110.7 kg), SpO2 98%.    HEENT: No thrush or ulcers, 2 areas of superficial desquamation of the right side of the tongue Resp: Lungs clear bilaterally Cardio: Regular rate and rhythm GI: Nontender, no mass, no hepatosplenomegaly Vascular: No leg edema Musculoskeletal: No pain with motion of the right shoulder.  No tenderness or rash at the right upper back  Portacath/PICC-without erythema  Lab Results:  Lab Results  Component Value Date   WBC 5.9 09/19/2023   HGB 9.9 (L) 09/19/2023   HCT 33.8 (L) 09/19/2023   MCV 75.3 (L) 09/19/2023   PLT 209 09/19/2023   NEUTROABS 4.2 09/19/2023    CMP  Lab Results  Component Value Date   NA 134 (L) 09/19/2023   K 3.8 09/19/2023   CL 99 09/19/2023   CO2 28 09/19/2023   GLUCOSE 224 (H) 09/19/2023   BUN 9 09/19/2023   CREATININE 0.83 09/19/2023   CALCIUM 8.9 09/19/2023   PROT 7.3 09/19/2023   ALBUMIN 4.1 09/19/2023   AST 30 09/19/2023   ALT 35 09/19/2023   ALKPHOS 179 (H) 09/19/2023   BILITOT 0.8 09/19/2023   GFRNONAA >60 09/19/2023   GFRAA >60 05/10/2020    Lab Results  Component Value Date   CEA1 1.08 03/07/2021   CEA 12.46 (H) 09/19/2023    Medications: I have reviewed the patient's current medications.   Assessment/Plan: Moderately differentiated adenocarcinoma of the ascending colon, stage IIb (T4aN0), status post a right colectomy 04/12/2020 Tumor  invades the visceral peritoneum, 0/19 lymph nodes, no lymphovascular or perineural invasion, no tumor deposits, MSS, no loss of mismatch repair protein expression Cycle 1 adjuvant Xeloda 05/17/2020 Cycle 2 adjuvant Xeloda 06/07/2020, Xeloda discontinued after 12 days secondary to nausea and rectal bleeding Cycle 3 adjuvant Xeloda 06/28/2020, dose reduced to 2000 mg a.m., 1500 mg p.m. secondary to nausea (patient discontinued Xeloda on day 11 due to colonoscopy) Colonoscopy 07/09/2020-nodular mucosa at the colonic anastomosis, biopsied; patent end-to-side ileocolonic anastomosis characterized by healthy-appearing mucosa and visible sutures; nonbleeding internal hemorrhoids, biopsy with benign ulcerated anastomotic mucosa with granulation tissue Cycle 4 adjuvant Xeloda 07/19/2020, 2000 mg every morning and 1500 mg every afternoon Cycle 5 adjuvant Xeloda 08/09/2020 Cycle 6 adjuvant Xeloda 08/31/2019 Cycle 7 adjuvant Xeloda 09/20/2020 Cycle 8 adjuvant Xeloda 10/11/2020 CTs 03/07/2021-no evidence of recurrent disease, hepatic steatosis, slight wall thickening at the junction of the descending and horizontal duodenum Mild elevation of CEA 2023 12/01/2021-Guardant Reveal-ct DNA detected PET 12/30/2021-hypermetabolic right liver lesion, hypermetabolic area in a loop of mid small bowel with an SUV of 12.5, review of PET images at GI tumor conference 01/11/2022-small bowel uptake felt to be a benign finding MRI liver 01/10/2022-solitary 1.2 cm right liver lesion between segments 7 and 8 compatible with metastasis, subtle changes suggesting cirrhosis, hepatic steatosis CT enteroscopy 02/02/2022-small bowel loop with hypermetabolic activity on PET continues to have a bandlike density along the margin felt to represent a diverticulum or  adjacent nodal tissue.  Small bowel tumor not excluded.  3-4 mm left omental nodule 02/08/2022-biopsy and ablation of solitary liver lesion, adenocarcinoma consistent with metastatic colorectal  adenocarcinoma CTs 03/31/2022-unchanged circumstantial soft tissue thickening surrounding a loop of mid to distal small bowel with an adjacent nodular focus measuring 1.2 cm.,  Ablation cavity in segment 7, no other evidence of metastatic disease Diagnostic laparoscopy 05/26/2022-mid small bowel mass-resected, well to moderately differentiated adenocarcinoma, 0/2 lymph nodes, morphology consistent with a colon primary, tumor involved full-thickness including serosa with perineural and large vessel involvement; foundation 1-microsatellite stable, tumor mutation burden 2, K-ras Q61H CT abdomen/pelvis 07/10/2022-the right liver ablation defect is smaller, new subtle right liver lesion measuring 13 x 9 mm, enlarged lymph node in the right upper quadrant mesentery adjacent to surgical anastomosis PET 07/26/2022-new segment 6 liver lesion, enlarging hypermetabolic left omental lesion, there is another mildly hypermetabolic peritoneal implant, 7 mm omental nodule at the midline without hypermetabolism Cycle 1 FOLFOX 09/25/2022 Cycle 2 FOLFOX/bevacizumab 10/09/2022, 5-FU dose reduced secondary to mucositis Cycle 3 FOLFOX/bevacizumab 10/23/2022 Cycle 4 FOLFOX/bevacizumab 11/06/2022 Cycle 5 FOLFOX/bevacizumab 11/20/2022 Cycle 6 FOLFOX/bevacizumab 12/04/2022 CT abdomen/pelvis 12/14/2022-further contraction of the treated segment 7 lesion, slight enlargement of segment 6 lesion, no new liver lesion, increased size of ileocolic nodes and an omental lesion, stable small retroperitoneal nodes Cycle 7 FOLFOX/bevacizumab 12/18/2022 Cycle 8 FOLFOX/bevacizumab 01/02/2023, oxaliplatin dose reduced secondary to prolonged nausea and neuropathy symptoms, Emend added Cycle 9 FOLFOX/bevacizumab 01/16/2023, oxaliplatin discontinued secondary to neuropathy Cycle 1 FOLFIRI 02/19/2023, Avastin held due to recent dental extractions Cycle 2 FOLFIRI/Avastin 03/05/2023 Cycle 3 FOLFIRI/Avastin 03/19/2023, 5-FU bolus held due to mucositis after cycle  2, Fulphila added Cycle 4 FOLFIRI/Avastin 04/02/2023, Fulphila, Emend Cycle 5 FOLFIRI/Avastin 04/16/2023, Fulphila, Emend CTs 05/02/2023-similar ileocolic mesenteric adenopathy and omental metastasis, cirrhosis with portal hypertension, stable liver lesions, no new or progressive disease Cycle 6 FOLFIRI/Avastin 05/07/2023, Fulphila, Emend Cycle 7 FOLFIRI/Avastin 05/21/2023, Fulphila, Emend Cycle 8 FOLFIRI 06/04/2023, Fulphila, Emend; Avastin held Cycle 9 FOLFIRI 06/18/2023, [name], Emend, Avastin held Treatment held 07/09/2023 patient request Cycle 10 FOLFIRI 07/18/2023, Fulphila, Emend, Avastin held CTs 07/31/2023: Stable with unchanged hepatic lesions, mesenteric lymph nodes, and left omental nodule, unchanged morphologic changes of cirrhosis and portal hypertension Cycle 11 FOLFIRI/Avastin 08/01/2023, Fulphila Chemotherapy held 08/15/2023 per patient request Cycle 12 FOLFIRI/Avastin 08/30/2023, Fulphila Cycle 13 FOLFIRI/Avastin 09/19/2023, Fulphila Microcytic anemia secondary to #1-improved Colon polyps-sessile serrated adenoma of the descending colon, tubular adenomas with focal high-grade dysplasia of the transverse colon on colonoscopy 02/12/2020, tubular adenoma of the mid ascending colon on the surgical specimen 04/12/2020 Family history of colon cancer Depression Anxiety/panic attacks Hypertension Hyperlipidemia Nausea secondary to Xeloda?-Resolved Gastroesophageal reflux disease-improved following discontinuation of Xeloda Anemia, microcytic; ferritin 6 01/24/2022; stool positive for blood x3 01/25/2022; 02/02/2022 CT abdomen/pelvis enterography-bowel loop demonstrating hypermetabolic activity continues to have a bandlike density along its margin possibly representing a diverticulum or adjacent nodal tissue, difficult to exclude small bowel tumor, 3 x 4 mm left omental nodule or lymph node, known right hepatic lobe tumor is occult on CT. Venofer weekly x3 beginning 03/03/2022 Negative capsule  endoscopy 03/09/2022 Venofer 03/03/2022 for 3 weekly doses Venofer 05/03/2022, 05/19/2022 Venofer 07/28/2022, 08/04/2022 Venofer 03/22/2023, 03/29/2023, 04/04/2023 Venofer 06/18/2023, 07/09/2023 07/03/2023 upper endoscopy and colonoscopy-severe portal hypertensive gastropathy in the gastric fundus, gastric body, stomach, red blood found greater curvature of the stomach and gastric antrum, treated with APC; colonoscopy showed dark old maroon-colored blood in the entire colon.  No evidence of active or recent bleeding in the  colon. 12.  Anterior left neck nodule 03/17/2022-cyst?,  Lymph node? 13.  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  Oxaliplatin neuropathy-mild loss of vibratory sense 12/04/2022, 12/18/2022, 01/02/2023    Disposition: Mr. Holzhausen appears stable.  He will complete another cycle of FOLFIRI/bevacizumab today.  He will return for an office visit and chemotherapy in 2 weeks.  He will be scheduled for a restaging CT evaluation in 1-2 months.  Thornton Papas, MD  09/19/2023  11:08 AM

## 2023-09-20 ENCOUNTER — Telehealth: Payer: Self-pay | Admitting: *Deleted

## 2023-09-20 ENCOUNTER — Encounter: Payer: Self-pay | Admitting: Oncology

## 2023-09-20 ENCOUNTER — Other Ambulatory Visit: Payer: Self-pay | Admitting: Nurse Practitioner

## 2023-09-20 ENCOUNTER — Other Ambulatory Visit: Payer: Self-pay

## 2023-09-20 NOTE — Telephone Encounter (Signed)
Lvm for pt to call and schedule

## 2023-09-20 NOTE — Telephone Encounter (Signed)
Called Mr. Sellen w/low ferritin and MD suggests giving Venofer tomorrow and 1 week later. Patient agrees and scheduling message sent. He expresses concern with drop in Hgb--informed him that decrease from 9.9 tp 10.1 is not statistically significant. Does not necessarily mean he is bleeding. Anemia can be due to chemo as well. MD not concerned with the increase in CEA at this time, since his tends to go up and down.

## 2023-09-21 ENCOUNTER — Inpatient Hospital Stay: Payer: 59

## 2023-09-21 VITALS — BP 115/62 | HR 70 | Temp 98.0°F | Resp 18

## 2023-09-21 DIAGNOSIS — E785 Hyperlipidemia, unspecified: Secondary | ICD-10-CM | POA: Diagnosis not present

## 2023-09-21 DIAGNOSIS — K219 Gastro-esophageal reflux disease without esophagitis: Secondary | ICD-10-CM | POA: Diagnosis not present

## 2023-09-21 DIAGNOSIS — D509 Iron deficiency anemia, unspecified: Secondary | ICD-10-CM

## 2023-09-21 DIAGNOSIS — C182 Malignant neoplasm of ascending colon: Secondary | ICD-10-CM | POA: Diagnosis not present

## 2023-09-21 DIAGNOSIS — C787 Secondary malignant neoplasm of liver and intrahepatic bile duct: Secondary | ICD-10-CM

## 2023-09-21 DIAGNOSIS — I10 Essential (primary) hypertension: Secondary | ICD-10-CM | POA: Diagnosis not present

## 2023-09-21 DIAGNOSIS — Z79899 Other long term (current) drug therapy: Secondary | ICD-10-CM | POA: Diagnosis not present

## 2023-09-21 DIAGNOSIS — Z5111 Encounter for antineoplastic chemotherapy: Secondary | ICD-10-CM | POA: Diagnosis not present

## 2023-09-21 MED ORDER — PEGFILGRASTIM-CBQV 6 MG/0.6ML ~~LOC~~ SOSY
6.0000 mg | PREFILLED_SYRINGE | Freq: Once | SUBCUTANEOUS | Status: AC
  Administered 2023-09-21: 6 mg via SUBCUTANEOUS
  Filled 2023-09-21: qty 0.6

## 2023-09-21 MED ORDER — SODIUM CHLORIDE 0.9 % IV SOLN
300.0000 mg | Freq: Once | INTRAVENOUS | Status: AC
  Administered 2023-09-21: 300 mg via INTRAVENOUS
  Filled 2023-09-21: qty 100

## 2023-09-21 MED ORDER — HEPARIN SOD (PORK) LOCK FLUSH 100 UNIT/ML IV SOLN
500.0000 [IU] | Freq: Once | INTRAVENOUS | Status: DC | PRN
Start: 2023-09-21 — End: 2023-09-21

## 2023-09-21 MED ORDER — SODIUM CHLORIDE 0.9 % IV SOLN
INTRAVENOUS | Status: DC
Start: 2023-09-21 — End: 2023-09-21

## 2023-09-21 MED ORDER — SODIUM CHLORIDE 0.9% FLUSH
10.0000 mL | INTRAVENOUS | Status: DC | PRN
Start: 2023-09-21 — End: 2023-09-21

## 2023-09-21 NOTE — Progress Notes (Signed)
FMLA for Significant Other, Dois Davenport, completed as requested. No other needs or concerns noted at this time.

## 2023-09-26 DIAGNOSIS — C182 Malignant neoplasm of ascending colon: Secondary | ICD-10-CM | POA: Diagnosis not present

## 2023-09-28 ENCOUNTER — Inpatient Hospital Stay: Payer: 59

## 2023-09-28 VITALS — BP 122/71 | HR 79 | Temp 98.7°F | Resp 18 | Ht 66.0 in | Wt 247.4 lb

## 2023-09-28 DIAGNOSIS — Z5111 Encounter for antineoplastic chemotherapy: Secondary | ICD-10-CM | POA: Diagnosis not present

## 2023-09-28 DIAGNOSIS — I10 Essential (primary) hypertension: Secondary | ICD-10-CM | POA: Diagnosis not present

## 2023-09-28 DIAGNOSIS — D509 Iron deficiency anemia, unspecified: Secondary | ICD-10-CM | POA: Diagnosis not present

## 2023-09-28 DIAGNOSIS — K219 Gastro-esophageal reflux disease without esophagitis: Secondary | ICD-10-CM | POA: Diagnosis not present

## 2023-09-28 DIAGNOSIS — C182 Malignant neoplasm of ascending colon: Secondary | ICD-10-CM | POA: Diagnosis not present

## 2023-09-28 DIAGNOSIS — Z79899 Other long term (current) drug therapy: Secondary | ICD-10-CM | POA: Diagnosis not present

## 2023-09-28 DIAGNOSIS — E785 Hyperlipidemia, unspecified: Secondary | ICD-10-CM | POA: Diagnosis not present

## 2023-09-28 MED ORDER — SODIUM CHLORIDE 0.9% FLUSH
10.0000 mL | Freq: Once | INTRAVENOUS | Status: AC | PRN
  Administered 2023-09-28: 10 mL

## 2023-09-28 MED ORDER — SODIUM CHLORIDE 0.9 % IV SOLN
INTRAVENOUS | Status: DC
Start: 2023-09-28 — End: 2023-09-28

## 2023-09-28 MED ORDER — SODIUM CHLORIDE 0.9 % IV SOLN
300.0000 mg | Freq: Once | INTRAVENOUS | Status: AC
  Administered 2023-09-28: 300.0065 mg via INTRAVENOUS
  Filled 2023-09-28: qty 200

## 2023-09-28 MED ORDER — HEPARIN SOD (PORK) LOCK FLUSH 100 UNIT/ML IV SOLN
500.0000 [IU] | Freq: Once | INTRAVENOUS | Status: AC | PRN
  Administered 2023-09-28: 500 [IU]

## 2023-09-28 NOTE — Patient Instructions (Signed)

## 2023-09-29 ENCOUNTER — Encounter: Payer: Self-pay | Admitting: Nurse Practitioner

## 2023-09-29 ENCOUNTER — Encounter: Payer: Self-pay | Admitting: Oncology

## 2023-09-30 ENCOUNTER — Other Ambulatory Visit: Payer: Self-pay | Admitting: Oncology

## 2023-10-01 ENCOUNTER — Inpatient Hospital Stay: Payer: 59 | Attending: Oncology

## 2023-10-01 ENCOUNTER — Inpatient Hospital Stay: Payer: 59

## 2023-10-01 ENCOUNTER — Inpatient Hospital Stay (HOSPITAL_BASED_OUTPATIENT_CLINIC_OR_DEPARTMENT_OTHER): Payer: 59 | Admitting: Nurse Practitioner

## 2023-10-01 VITALS — BP 118/66 | HR 78

## 2023-10-01 VITALS — BP 122/68 | HR 82 | Temp 97.6°F | Resp 18 | Ht 66.0 in | Wt 248.0 lb

## 2023-10-01 DIAGNOSIS — Z5111 Encounter for antineoplastic chemotherapy: Secondary | ICD-10-CM | POA: Diagnosis not present

## 2023-10-01 DIAGNOSIS — C787 Secondary malignant neoplasm of liver and intrahepatic bile duct: Secondary | ICD-10-CM

## 2023-10-01 DIAGNOSIS — C182 Malignant neoplasm of ascending colon: Secondary | ICD-10-CM | POA: Insufficient documentation

## 2023-10-01 DIAGNOSIS — C189 Malignant neoplasm of colon, unspecified: Secondary | ICD-10-CM

## 2023-10-01 DIAGNOSIS — D509 Iron deficiency anemia, unspecified: Secondary | ICD-10-CM

## 2023-10-01 DIAGNOSIS — Z79899 Other long term (current) drug therapy: Secondary | ICD-10-CM | POA: Insufficient documentation

## 2023-10-01 LAB — CMP (CANCER CENTER ONLY)
ALT: 29 U/L (ref 0–44)
AST: 24 U/L (ref 15–41)
Albumin: 3.6 g/dL (ref 3.5–5.0)
Alkaline Phosphatase: 177 U/L — ABNORMAL HIGH (ref 38–126)
Anion gap: 9 (ref 5–15)
BUN: 9 mg/dL (ref 6–20)
CO2: 25 mmol/L (ref 22–32)
Calcium: 8.4 mg/dL — ABNORMAL LOW (ref 8.9–10.3)
Chloride: 102 mmol/L (ref 98–111)
Creatinine: 0.74 mg/dL (ref 0.61–1.24)
GFR, Estimated: >60 mL/min (ref >60)
Glucose, Bld: 238 mg/dL — ABNORMAL HIGH (ref 70–99)
Potassium: 3.5 mmol/L (ref 3.5–5.1)
Sodium: 136 mmol/L (ref 135–145)
Total Bilirubin: 0.9 mg/dL (ref 0.0–1.2)
Total Protein: 6.9 g/dL (ref 6.5–8.1)

## 2023-10-01 LAB — CBC WITH DIFFERENTIAL (CANCER CENTER ONLY)
Abs Immature Granulocytes: 0.13 10*3/uL — ABNORMAL HIGH (ref 0.00–0.07)
Basophils Absolute: 0.1 10*3/uL (ref 0.0–0.1)
Basophils Relative: 1 %
Eosinophils Absolute: 0.1 10*3/uL (ref 0.0–0.5)
Eosinophils Relative: 1 %
HCT: 32.5 % — ABNORMAL LOW (ref 39.0–52.0)
Hemoglobin: 9.4 g/dL — ABNORMAL LOW (ref 13.0–17.0)
Immature Granulocytes: 2 %
Lymphocytes Relative: 14 %
Lymphs Abs: 0.9 10*3/uL (ref 0.7–4.0)
MCH: 23.3 pg — ABNORMAL LOW (ref 26.0–34.0)
MCHC: 28.9 g/dL — ABNORMAL LOW (ref 30.0–36.0)
MCV: 80.4 fL (ref 80.0–100.0)
Monocytes Absolute: 0.3 10*3/uL (ref 0.1–1.0)
Monocytes Relative: 4 %
Neutro Abs: 5.3 10*3/uL (ref 1.7–7.7)
Neutrophils Relative %: 78 %
Platelet Count: 127 10*3/uL — ABNORMAL LOW (ref 150–400)
RBC: 4.04 MIL/uL — ABNORMAL LOW (ref 4.22–5.81)
RDW: 23.4 % — ABNORMAL HIGH (ref 11.5–15.5)
WBC Count: 6.8 10*3/uL (ref 4.0–10.5)
nRBC: 0.3 % — ABNORMAL HIGH (ref 0.0–0.2)

## 2023-10-01 LAB — CEA (ACCESS): CEA (CHCC): 11.21 ng/mL — ABNORMAL HIGH (ref 0.00–5.00)

## 2023-10-01 LAB — TOTAL PROTEIN, URINE DIPSTICK: Protein, ur: NEGATIVE mg/dL

## 2023-10-01 MED ORDER — ATROPINE SULFATE 1 MG/ML IV SOLN
0.5000 mg | Freq: Once | INTRAVENOUS | Status: AC | PRN
  Administered 2023-10-01: 0.5 mg via INTRAVENOUS
  Filled 2023-10-01: qty 1

## 2023-10-01 MED ORDER — SODIUM CHLORIDE 0.9 % IV SOLN
1600.0000 mg/m2 | INTRAVENOUS | Status: DC
  Administered 2023-10-01: 3500 mg via INTRAVENOUS
  Filled 2023-10-01: qty 70

## 2023-10-01 MED ORDER — PALONOSETRON HCL INJECTION 0.25 MG/5ML
0.2500 mg | Freq: Once | INTRAVENOUS | Status: AC
  Administered 2023-10-01: 0.25 mg via INTRAVENOUS
  Filled 2023-10-01: qty 5

## 2023-10-01 MED ORDER — FLUOROURACIL CHEMO INJECTION 500 MG/10ML
200.0000 mg/m2 | Freq: Once | INTRAVENOUS | Status: AC
  Administered 2023-10-01: 450 mg via INTRAVENOUS
  Filled 2023-10-01: qty 9

## 2023-10-01 MED ORDER — DEXAMETHASONE SODIUM PHOSPHATE 10 MG/ML IJ SOLN
10.0000 mg | Freq: Once | INTRAMUSCULAR | Status: AC
  Administered 2023-10-01: 10 mg via INTRAVENOUS
  Filled 2023-10-01: qty 1

## 2023-10-01 MED ORDER — SODIUM CHLORIDE 0.9 % IV SOLN: Freq: Once | INTRAVENOUS | Status: AC

## 2023-10-01 MED ORDER — SODIUM CHLORIDE 0.9 % IV SOLN
180.0000 mg/m2 | Freq: Once | INTRAVENOUS | Status: AC
  Administered 2023-10-01: 400 mg via INTRAVENOUS
  Filled 2023-10-01: qty 15

## 2023-10-01 MED ORDER — SODIUM CHLORIDE 0.9 % IV SOLN
200.0000 mg/m2 | Freq: Once | INTRAVENOUS | Status: AC
  Administered 2023-10-01: 444 mg via INTRAVENOUS
  Filled 2023-10-01: qty 22.2

## 2023-10-01 MED ORDER — SODIUM CHLORIDE 0.9 % IV SOLN
5.0000 mg/kg | Freq: Once | INTRAVENOUS | Status: AC
  Administered 2023-10-01: 500 mg via INTRAVENOUS
  Filled 2023-10-01: qty 16

## 2023-10-01 MED ORDER — SODIUM CHLORIDE 0.9 % IV SOLN
150.0000 mg | Freq: Once | INTRAVENOUS | Status: AC
  Administered 2023-10-01: 150 mg via INTRAVENOUS
  Filled 2023-10-01: qty 150

## 2023-10-01 NOTE — Progress Notes (Signed)
 Patient seen by Dr. Thornton Papas today  Vitals are within treatment parameters:Yes   Labs are within treatment parameters: Yes   Treatment plan has been signed: Yes   Per physician team, Patient is ready for treatment and there are NO modifications to the treatment plan.

## 2023-10-01 NOTE — Progress Notes (Signed)
[facility] OFFICE PROGRESS NOTE   Diagnosis: Colon cancer  INTERVAL HISTORY:   Anthony Calhoun completed another cycle of FOLFIRI/bevacizumab 09/19/2023.  He had mild nausea and diarrhea following chemotherapy.  No bleeding.  The discomfort of the left lower back for the past 2 days when lying down.  The pain resolves when he stands.  He has burning of the tongue. He received IV iron 09/21/2023 and 09/28/2023.  No symptom of an allergic reaction.  Objective:  Vital signs in last 24 hours:  Blood pressure 122/68, pulse 82, temperature 97.6 F (36.4 C), temperature source Oral, resp. rate 18, height 5\' 6"  (1.676 m), weight 248 lb (112.5 kg), SpO2 98%.    HEENT: Thrush or ulcers Resp: Lungs clear bilaterally Cardio: Regular rate and rhythm GI: No hepatosplenomegaly, no mass, nontender Vascular: No leg edema    Portacath/PICC-without erythema  Lab Results:  Lab Results  Component Value Date   WBC 6.8 10/01/2023   HGB 9.4 (L) 10/01/2023   HCT 32.5 (L) 10/01/2023   MCV 80.4 10/01/2023   PLT 127 (L) 10/01/2023   NEUTROABS 5.3 10/01/2023    CMP  Lab Results  Component Value Date   NA 134 (L) 09/19/2023   K 3.8 09/19/2023   CL 99 09/19/2023   CO2 28 09/19/2023   GLUCOSE 224 (H) 09/19/2023   BUN 9 09/19/2023   CREATININE 0.83 09/19/2023   CALCIUM 8.9 09/19/2023   PROT 7.3 09/19/2023   ALBUMIN 4.1 09/19/2023   AST 30 09/19/2023   ALT 35 09/19/2023   ALKPHOS 179 (H) 09/19/2023   BILITOT 0.8 09/19/2023   GFRNONAA >60 09/19/2023   GFRAA >60 05/10/2020    Lab Results  Component Value Date   CEA1 1.08 03/07/2021   CEA 12.46 (H) 09/19/2023     Medications: I have reviewed the patient's current medications.   Assessment/Plan:  Moderately differentiated adenocarcinoma of the ascending colon, stage IIb (T4aN0), status post a right colectomy 04/12/2020 Tumor invades the visceral peritoneum, 0/19 lymph nodes, no lymphovascular or perineural invasion, no  tumor deposits, MSS, no loss of mismatch repair protein expression Cycle 1 adjuvant Xeloda 05/17/2020 Cycle 2 adjuvant Xeloda 06/07/2020, Xeloda discontinued after 12 days secondary to nausea and rectal bleeding Cycle 3 adjuvant Xeloda 06/28/2020, dose reduced to 2000 mg a.m., 1500 mg p.m. secondary to nausea (patient discontinued Xeloda on day 11 due to colonoscopy) Colonoscopy 07/09/2020-nodular mucosa at the colonic anastomosis, biopsied; patent end-to-side ileocolonic anastomosis characterized by healthy-appearing mucosa and visible sutures; nonbleeding internal hemorrhoids, biopsy with benign ulcerated anastomotic mucosa with granulation tissue Cycle 4 adjuvant Xeloda 07/19/2020, 2000 mg every morning and 1500 mg every afternoon Cycle 5 adjuvant Xeloda 08/09/2020 Cycle 6 adjuvant Xeloda 08/31/2019 Cycle 7 adjuvant Xeloda 09/20/2020 Cycle 8 adjuvant Xeloda 10/11/2020 CTs 03/07/2021-no evidence of recurrent disease, hepatic steatosis, slight wall thickening at the junction of the descending and horizontal duodenum Mild elevation of CEA 2023 12/01/2021-Guardant Reveal-ct DNA detected PET 12/30/2021-hypermetabolic right liver lesion, hypermetabolic area in a loop of mid small bowel with an SUV of 12.5, review of PET images at GI tumor conference 01/11/2022-small bowel uptake felt to be a benign finding MRI liver 01/10/2022-solitary 1.2 cm right liver lesion between segments 7 and 8 compatible with metastasis, subtle changes suggesting cirrhosis, hepatic steatosis CT enteroscopy 02/02/2022-small bowel loop with hypermetabolic activity on PET continues to have a bandlike density along the margin felt to represent a diverticulum or adjacent nodal tissue.  Small bowel tumor not excluded.  3-4 mm  left omental nodule 02/08/2022-biopsy and ablation of solitary liver lesion, adenocarcinoma consistent with metastatic colorectal adenocarcinoma CTs 03/31/2022-unchanged circumstantial soft tissue thickening surrounding a loop  of mid to distal small bowel with an adjacent nodular focus measuring 1.2 cm.,  Ablation cavity in segment 7, no other evidence of metastatic disease Diagnostic laparoscopy 05/26/2022-mid small bowel mass-resected, well to moderately differentiated adenocarcinoma, 0/2 lymph nodes, morphology consistent with a colon primary, tumor involved full-thickness including serosa with perineural and large vessel involvement; foundation 1-microsatellite stable, tumor mutation burden 2, K-ras Q61H CT abdomen/pelvis 07/10/2022-the right liver ablation defect is smaller, new subtle right liver lesion measuring 13 x 9 mm, enlarged lymph node in the right upper quadrant mesentery adjacent to surgical anastomosis PET 07/26/2022-new segment 6 liver lesion, enlarging hypermetabolic left omental lesion, there is another mildly hypermetabolic peritoneal implant, 7 mm omental nodule at the midline without hypermetabolism Cycle 1 FOLFOX 09/25/2022 Cycle 2 FOLFOX/bevacizumab 10/09/2022, 5-FU dose reduced secondary to mucositis Cycle 3 FOLFOX/bevacizumab 10/23/2022 Cycle 4 FOLFOX/bevacizumab 11/06/2022 Cycle 5 FOLFOX/bevacizumab 11/20/2022 Cycle 6 FOLFOX/bevacizumab 12/04/2022 CT abdomen/pelvis 12/14/2022-further contraction of the treated segment 7 lesion, slight enlargement of segment 6 lesion, no new liver lesion, increased size of ileocolic nodes and an omental lesion, stable small retroperitoneal nodes Cycle 7 FOLFOX/bevacizumab 12/18/2022 Cycle 8 FOLFOX/bevacizumab 01/02/2023, oxaliplatin dose reduced secondary to prolonged nausea and neuropathy symptoms, Emend added Cycle 9 FOLFOX/bevacizumab 01/16/2023, oxaliplatin discontinued secondary to neuropathy Cycle 1 FOLFIRI 02/19/2023, Avastin held due to recent dental extractions Cycle 2 FOLFIRI/Avastin 03/05/2023 Cycle 3 FOLFIRI/Avastin 03/19/2023, 5-FU bolus held due to mucositis after cycle 2, Fulphila added Cycle 4 FOLFIRI/Avastin 04/02/2023, Fulphila, Emend Cycle 5 FOLFIRI/Avastin  04/16/2023, Fulphila, Emend CTs 05/02/2023-similar ileocolic mesenteric adenopathy and omental metastasis, cirrhosis with portal hypertension, stable liver lesions, no new or progressive disease Cycle 6 FOLFIRI/Avastin 05/07/2023, Fulphila, Emend Cycle 7 FOLFIRI/Avastin 05/21/2023, Fulphila, Emend Cycle 8 FOLFIRI 06/04/2023, Fulphila, Emend; Avastin held Cycle 9 FOLFIRI 06/18/2023, [name], Emend, Avastin held Treatment held 07/09/2023 patient request Cycle 10 FOLFIRI 07/18/2023, Fulphila, Emend, Avastin held CTs 07/31/2023: Stable with unchanged hepatic lesions, mesenteric lymph nodes, and left omental nodule, unchanged morphologic changes of cirrhosis and portal hypertension Cycle 11 FOLFIRI/Avastin 08/01/2023, Fulphila Chemotherapy held 08/15/2023 per patient request Cycle 12 FOLFIRI/Avastin 08/30/2023, Fulphila Cycle 13 FOLFIRI/Avastin 09/19/2023, Fulphila Cycle 14 FOLFIRI/Avastin 10/01/2023, Fila Microcytic anemia secondary to #1-improved Colon polyps-sessile serrated adenoma of the descending colon, tubular adenomas with focal high-grade dysplasia of the transverse colon on colonoscopy 02/12/2020, tubular adenoma of the mid ascending colon on the surgical specimen 04/12/2020 Family history of colon cancer Depression Anxiety/panic attacks Hypertension Hyperlipidemia Nausea secondary to Xeloda?-Resolved Gastroesophageal reflux disease-improved following discontinuation of Xeloda Anemia, microcytic; ferritin 6 01/24/2022; stool positive for blood x3 01/25/2022; 02/02/2022 CT abdomen/pelvis enterography-bowel loop demonstrating hypermetabolic activity continues to have a bandlike density along its margin possibly representing a diverticulum or adjacent nodal tissue, difficult to exclude small bowel tumor, 3 x 4 mm left omental nodule or lymph node, known right hepatic lobe tumor is occult on CT. Venofer weekly x3 beginning 03/03/2022 Negative capsule endoscopy 03/09/2022 Venofer 03/03/2022 for 3 weekly  doses Venofer 05/03/2022, 05/19/2022 Venofer 07/28/2022, 08/04/2022 Venofer 03/22/2023, 03/29/2023, 04/04/2023 Venofer 06/18/2023, 07/09/2023 07/03/2023 upper endoscopy and colonoscopy-severe portal hypertensive gastropathy in the gastric fundus, gastric body, stomach, red blood found greater curvature of the stomach and gastric antrum, treated with APC; colonoscopy showed dark old maroon-colored blood in the entire colon.  No evidence of active or recent bleeding in the colon. 12.  Anterior left neck nodule  03/17/2022-cyst?,  Lymph node? 13.  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  Oxaliplatin neuropathy-mild loss of vibratory sense 12/04/2022, 12/18/2022, 01/02/2023    Disposition: Anthony Calhoun appears stable.  He is tolerating the FOLFIRI/bevacizumab well.  He will complete another cycle today.  We will follow-up on the CEA from today.  He will be referred for restaging CTs in approximately 1 month.  He will return for an office visit and chemotherapy in 2 weeks.  He has chronic iron deficiency anemia, likely secondary to GI blood loss in the setting of portal hypertensive gastropathy.  The hemoglobin is stable and the MCV is higher after receiving IV iron.  Will check a ferritin level when he returns in 2 weeks.  Anthony Papas, MD  10/01/2023  9:37 AM

## 2023-10-01 NOTE — Patient Instructions (Addendum)
[facility] A [facility]PT OF [facility]   Discharge Instructions: The chemotherapy medication bag should finish at 46 hours, 96 hours, or 7 days. For example, if your pump is scheduled for 46 hours and it was put on at 4:00 p.m., it should finish at 2:00 p.m. the day it is scheduled to come off regardless of your appointment time.     Estimated time to finish at 11:45 Wednesday, October 03, 2023.   If the display on your pump reads "Low Volume" and it is beeping, take the batteries out of the pump and come to the cancer center for it to be taken off.   If the pump alarms go off prior to the pump reading "Low Volume" then call [phone] and someone can assist you.  If the plunger comes out and the chemotherapy medication is leaking out, please use your home chemo spill kit to clean up the spill. Do NOT use paper towels or other household products.  If you have problems or questions regarding your pump, please call either [phone] (24 hours a day) or the cancer center Monday-Friday 8:00 a.m.- 4:30 p.m. at the clinic number and we will assist you. If you are unable to get assistance, then go to the nearest Emergency Department and ask the staff to contact the IV team for assistance.   Thank you for choosing [facility] to provide your oncology and hematology care.   If you have a lab appointment with the Cancer Center, please go directly to the Cancer Center and check in at the registration area.   Wear comfortable clothing and clothing appropriate for easy access to any Portacath or PICC line.   We strive to give you quality time with your provider. You may need to reschedule your appointment if you arrive late (15 or more minutes).  Arriving late affects you and other patients whose appointments are after yours.  Also, if you miss three or more appointments without notifying the office, you may be dismissed from the clinic at the provider's  discretion.      For prescription refill requests, have your pharmacy contact our office and allow 72 hours for refills to be completed.    Today you received the following chemotherapy and/or immunotherapy agents AVASTIN, IRINOTECAN, LEUCOVORIN, FLUOROURACIL      To help prevent nausea and vomiting after your treatment, we encourage you to take your nausea medication as directed.  BELOW ARE SYMPTOMS THAT SHOULD BE REPORTED IMMEDIATELY: *FEVER GREATER THAN 100.4 F (38 C) OR HIGHER *CHILLS OR SWEATING *NAUSEA AND VOMITING THAT IS NOT CONTROLLED WITH YOUR NAUSEA MEDICATION *UNUSUAL SHORTNESS OF BREATH *UNUSUAL BRUISING OR BLEEDING *URINARY PROBLEMS (pain or burning when urinating, or frequent urination) *BOWEL PROBLEMS (unusual diarrhea, constipation, pain near the anus) TENDERNESS IN MOUTH AND THROAT WITH OR WITHOUT PRESENCE OF ULCERS (sore throat, sores in mouth, or a toothache) UNUSUAL RASH, SWELLING OR PAIN  UNUSUAL VAGINAL DISCHARGE OR ITCHING   Items with * indicate a potential emergency and should be followed up as soon as possible or go to the Emergency Department if any problems should occur.  Please show the CHEMOTHERAPY ALERT CARD or IMMUNOTHERAPY ALERT CARD at check-in to the Emergency Department and triage nurse.  Should you have questions after your visit or need to cancel or reschedule your appointment, please contact [facility]  Dept: [phone]  and follow the prompts.  Office hours are 8:00 a.m. to 4:30 p.m. Monday - Friday. Please note that voicemails left after 4:00 p.m. may not be returned until the following business day.  We are closed weekends and major holidays. You have access to a nurse at all times for urgent questions. Please call the main number to the clinic Dept: [phone] and follow the prompts.   For any non-urgent questions, you may also contact your provider using MyChart. We now offer  e-Visits for anyone 16 and older to request care online for non-urgent symptoms. For details visit [email or web address].   Also download the MyChart app! Go to the app store, search "MyChart", open the app, select [facility], and log in with your MyChart username and password.  Bevacizumab Injection What is this medication? BEVACIZUMAB (be va SIZ yoo mab) treats some types of cancer. It works by blocking a protein that causes cancer cells to grow and multiply. This helps to slow or stop the spread of cancer cells. It is a monoclonal antibody. This medicine may be used for other purposes; ask your health care provider or pharmacist if you have questions. COMMON BRAND NAME(S): Alymsys, Avastin, MVASI, [place] Lao What should I tell my care team before I take this medication? They need to know if you have any of these conditions: Blood clots Coughing up blood Having or recent surgery Heart failure High blood pressure History of a connection between 2 or more body parts that do not usually connect (fistula) History of a tear in your stomach or intestines Protein in your urine An unusual or allergic reaction to bevacizumab, other medications, foods, dyes, or preservatives Pregnant or trying to get pregnant Breast-feeding How should I use this medication? This medication is injected into a vein. It is given by your care team in a hospital or clinic setting. Talk to your care team the use of this medication in children. Special care may be needed. Overdosage: If you think you have taken too much of this medicine contact a poison control center or emergency room at once. NOTE: This medicine is only for you. Do not share this medicine with others. What if I miss a dose? Keep appointments for follow-up doses. It is important not to miss your dose. Call your care team if you are unable to keep an appointment. What may interact with this medication? Interactions are not expected. This  list may not describe all possible interactions. Give your health care provider a list of all the medicines, herbs, non-prescription drugs, or dietary supplements you use. Also tell them if you smoke, drink alcohol, or use illegal drugs. Some items may interact with your medicine. What should I watch for while using this medication? Your condition will be monitored carefully while you are receiving this medication. You may need blood work while taking this medication. This medication may make you feel generally unwell. This is not uncommon as chemotherapy can affect healthy cells as well as cancer cells. Report any side effects. Continue your course of treatment even though you feel ill unless your care team tells you to stop. This medication may increase your risk to bruise or bleed. Call your care team if you notice any unusual bleeding. Before having surgery, talk to your care team to make sure it is ok. This medication can increase the risk of poor healing of your surgical site or wound. You will need to stop this medication for 28 days before surgery. After surgery, wait at least 28 days before restarting this  medication. Make sure the surgical site or wound is healed enough before restarting this medication. Talk to your care team if questions. Talk to your care team if you may be pregnant. Serious birth defects can occur if you take this medication during pregnancy and for 6 months after the last dose. Contraception is recommended while taking this medication and for 6 months after the last dose. Your care team can help you find the option that works for you. Do not breastfeed while taking this medication and for 6 months after the last dose. This medication can cause infertility. Talk to your care team if you are concerned about your fertility. What side effects may I notice from receiving this medication? Side effects that you should report to your care team as soon as possible: Allergic  reactions--skin rash, itching, hives, swelling of the face, lips, tongue, or throat Bleeding--bloody or black, tar-like stools, vomiting blood or brown material that looks like coffee grounds, red or dark brown urine, small red or purple spots on skin, unusual bruising or bleeding Blood clot--pain, swelling, or warmth in the leg, shortness of breath, chest pain Heart attack--pain or tightness in the chest, shoulders, arms, or jaw, nausea, shortness of breath, cold or clammy skin, feeling faint or lightheaded Heart failure--shortness of breath, swelling of the ankles, feet, or hands, sudden weight gain, unusual weakness or fatigue Increase in blood pressure Infection--fever, chills, cough, sore throat, wounds that don't heal, pain or trouble when passing urine, general feeling of discomfort or being unwell Infusion reactions--chest pain, shortness of breath or trouble breathing, feeling faint or lightheaded Kidney injury--decrease in the amount of urine, swelling of the ankles, hands, or feet Stomach pain that is severe, does not go away, or gets worse Stroke--sudden numbness or weakness of the face, arm, or leg, trouble speaking, confusion, trouble walking, loss of balance or coordination, dizziness, severe headache, change in vision Sudden and severe headache, confusion, change in vision, seizures, which may be signs of posterior reversible encephalopathy syndrome (PRES) Side effects that usually do not require medical attention (report to your care team if they continue or are bothersome): Back pain Change in taste Diarrhea Dry skin Increased tears Nosebleed This list may not describe all possible side effects. Call your doctor for medical advice about side effects. You may report side effects to FDA at 1-800-FDA-1088. Where should I keep my medication? This medication is given in a hospital or clinic. It will not be stored at home. NOTE: This sheet is a summary. It may not cover all possible  information. If you have questions about this medicine, talk to your doctor, pharmacist, or health care provider.  2024 Elsevier/Gold Standard (2021-12-30 00:00:00) Irinotecan Injection What is this medication? IRINOTECAN (ir in oh TEE kan) treats some types of cancer. It works by slowing down the growth of cancer cells. This medicine may be used for other purposes; ask your health care provider or pharmacist if you have questions. COMMON BRAND NAME(S): Camptosar What should I tell my care team before I take this medication? They need to know if you have any of these conditions: Dehydration Diarrhea Infection, especially a viral infection, such as chickenpox, cold sores, herpes Liver disease Low blood cell levels (white cells, red cells, and platelets) Low levels of electrolytes, such as calcium, magnesium, or potassium in your blood Recent or ongoing radiation An unusual or allergic reaction to irinotecan, other medications, foods, dyes, or preservatives If you or your partner are pregnant or trying to get  pregnant Breast-feeding How should I use this medication? This medication is injected into a vein. It is given by your care team in a hospital or clinic setting. Talk to your care team about the use of this medication in children. Special care may be needed. Overdosage: If you think you have taken too much of this medicine contact a poison control center or emergency room at once. NOTE: This medicine is only for you. Do not share this medicine with others. What if I miss a dose? Keep appointments for follow-up doses. It is important not to miss your dose. Call your care team if you are unable to keep an appointment. What may interact with this medication? Do not take this medication with any of the following: Cobicistat Itraconazole This medication may also interact with the following: Certain antibiotics, such as clarithromycin, rifampin, rifabutin Certain antivirals for HIV or  AIDS Certain medications for fungal infections, such as ketoconazole, posaconazole, voriconazole Certain medications for seizures, such as carbamazepine, phenobarbital, phenytoin Gemfibrozil Nefazodone St. John's wort This list may not describe all possible interactions. Give your health care provider a list of all the medicines, herbs, non-prescription drugs, or dietary supplements you use. Also tell them if you smoke, drink alcohol, or use illegal drugs. Some items may interact with your medicine. What should I watch for while using this medication? Your condition will be monitored carefully while you are receiving this medication. You may need blood work while taking this medication. This medication may make you feel generally unwell. This is not uncommon as chemotherapy can affect healthy cells as well as cancer cells. Report any side effects. Continue your course of treatment even though you feel ill unless your care team tells you to stop. This medication can cause serious side effects. To reduce the risk, your care team may give you other medications to take before receiving this one. Be sure to follow the directions from your care team. This medication may affect your coordination, reaction time, or judgement. Do not drive or operate machinery until you know how this medication affects you. Sit up or stand slowly to reduce the risk of dizzy or fainting spells. Drinking alcohol with this medication can increase the risk of these side effects. This medication may increase your risk of getting an infection. Call your care team for advice if you get a fever, chills, sore throat, or other symptoms of a cold or flu. Do not treat yourself. Try to avoid being around people who are sick. Avoid taking medications that contain aspirin, acetaminophen, ibuprofen, naproxen, or ketoprofen unless instructed by your care team. These medications may hide a fever. This medication may increase your risk to bruise  or bleed. Call your care team if you notice any unusual bleeding. Be careful brushing or flossing your teeth or using a toothpick because you may get an infection or bleed more easily. If you have any dental work done, tell your dentist you are receiving this medication. Talk to your care team if you or your partner are pregnant or think either of you might be pregnant. This medication can cause serious birth defects if taken during pregnancy and for 6 months after the last dose. You will need a negative pregnancy test before starting this medication. Contraception is recommended while taking this medication and for 6 months after the last dose. Your care team can help you find the option that works for you. Do not father a child while taking this medication and for 3 months  after the last dose. Use a condom for contraception during this time period. Do not breastfeed while taking this medication and for 7 days after the last dose. This medication may cause infertility. Talk to your care team if you are concerned about your fertility. What side effects may I notice from receiving this medication? Side effects that you should report to your care team as soon as possible: Allergic reactions--skin rash, itching, hives, swelling of the face, lips, tongue, or throat Dry cough, shortness of breath or trouble breathing Increased saliva or tears, increased sweating, stomach cramping, diarrhea, small pupils, unusual weakness or fatigue, slow heartbeat Infection--fever, chills, cough, sore throat, wounds that don't heal, pain or trouble when passing urine, general feeling of discomfort or being unwell Kidney injury--decrease in the amount of urine, swelling of the ankles, hands, or feet Low red blood cell level--unusual weakness or fatigue, dizziness, headache, trouble breathing Severe or prolonged diarrhea Unusual bruising or bleeding Side effects that usually do not require medical attention (report to your  care team if they continue or are bothersome): Constipation Diarrhea Hair loss Loss of appetite Nausea Stomach pain This list may not describe all possible side effects. Call your doctor for medical advice about side effects. You may report side effects to FDA at 1-800-FDA-1088. Where should I keep my medication? This medication is given in a hospital or clinic. It will not be stored at home. NOTE: This sheet is a summary. It may not cover all possible information. If you have questions about this medicine, talk to your doctor, pharmacist, or health care provider.  2024 Elsevier/Gold Standard (2021-12-26 00:00:00)  Leucovorin Injection What is this medication? LEUCOVORIN (loo koe VOR in) prevents side effects from certain medications, such as methotrexate. It works by increasing folate levels. This helps protect healthy cells in your body. It may also be used to treat anemia caused by low levels of folate. It can also be used with fluorouracil, a type of chemotherapy, to treat colorectal cancer. It works by increasing the effects of fluorouracil in the body. This medicine may be used for other purposes; ask your health care provider or pharmacist if you have questions. What should I tell my care team before I take this medication? They need to know if you have any of these conditions: Anemia from low levels of vitamin B12 in the blood An unusual or allergic reaction to leucovorin, folic acid, other medications, foods, dyes, or preservatives Pregnant or trying to get pregnant Breastfeeding How should I use this medication? This medication is injected into a vein or a muscle. It is given by your care team in a hospital or clinic setting. Talk to your care team about the use of this medication in children. Special care may be needed. Overdosage: If you think you have taken too much of this medicine contact a poison control center or emergency room at once. NOTE: This medicine is only for  you. Do not share this medicine with others. What if I miss a dose? Keep appointments for follow-up doses. It is important not to miss your dose. Call your care team if you are unable to keep an appointment. What may interact with this medication? Capecitabine Fluorouracil Phenobarbital Phenytoin Primidone Trimethoprim;sulfamethoxazole This list may not describe all possible interactions. Give your health care provider a list of all the medicines, herbs, non-prescription drugs, or dietary supplements you use. Also tell them if you smoke, drink alcohol, or use illegal drugs. Some items may interact with your medicine.  What should I watch for while using this medication? Your condition will be monitored carefully while you are receiving this medication. This medication may increase the side effects of 5-fluorouracil. Tell your care team if you have diarrhea or mouth sores that do not get better or that get worse. What side effects may I notice from receiving this medication? Side effects that you should report to your care team as soon as possible: Allergic reactions--skin rash, itching, hives, swelling of the face, lips, tongue, or throat This list may not describe all possible side effects. Call your doctor for medical advice about side effects. You may report side effects to FDA at 1-800-FDA-1088. Where should I keep my medication? This medication is given in a hospital or clinic. It will not be stored at home. NOTE: This sheet is a summary. It may not cover all possible information. If you have questions about this medicine, talk to your doctor, pharmacist, or health care provider.  2024 Elsevier/Gold Standard (2022-01-17 00:00:00)  Fluorouracil Injection What is this medication? FLUOROURACIL (flure oh YOOR a sil) treats some types of cancer. It works by slowing down the growth of cancer cells. This medicine may be used for other purposes; ask your health care provider or pharmacist if  you have questions. COMMON BRAND NAME(S): Adrucil What should I tell my care team before I take this medication? They need to know if you have any of these conditions: Blood disorders Dihydropyrimidine dehydrogenase (DPD) deficiency Infection, such as chickenpox, cold sores, herpes Kidney disease Liver disease Poor nutrition Recent or ongoing radiation therapy An unusual or allergic reaction to fluorouracil, other medications, foods, dyes, or preservatives If you or your partner are pregnant or trying to get pregnant Breast-feeding How should I use this medication? This medication is injected into a vein. It is administered by your care team in a hospital or clinic setting. Talk to your care team about the use of this medication in children. Special care may be needed. Overdosage: If you think you have taken too much of this medicine contact a poison control center or emergency room at once. NOTE: This medicine is only for you. Do not share this medicine with others. What if I miss a dose? Keep appointments for follow-up doses. It is important not to miss your dose. Call your care team if you are unable to keep an appointment. What may interact with this medication? Do not take this medication with any of the following: Live virus vaccines This medication may also interact with the following: Medications that treat or prevent blood clots, such as warfarin, enoxaparin, dalteparin This list may not describe all possible interactions. Give your health care provider a list of all the medicines, herbs, non-prescription drugs, or dietary supplements you use. Also tell them if you smoke, drink alcohol, or use illegal drugs. Some items may interact with your medicine. What should I watch for while using this medication? Your condition will be monitored carefully while you are receiving this medication. This medication may make you feel generally unwell. This is not uncommon as chemotherapy can  affect healthy cells as well as cancer cells. Report any side effects. Continue your course of treatment even though you feel ill unless your care team tells you to stop. In some cases, you may be given additional medications to help with side effects. Follow all directions for their use. This medication may increase your risk of getting an infection. Call your care team for advice if you get a fever,  chills, sore throat, or other symptoms of a cold or flu. Do not treat yourself. Try to avoid being around people who are sick. This medication may increase your risk to bruise or bleed. Call your care team if you notice any unusual bleeding. Be careful brushing or flossing your teeth or using a toothpick because you may get an infection or bleed more easily. If you have any dental work done, tell your dentist you are receiving this medication. Avoid taking medications that contain aspirin, acetaminophen, ibuprofen, naproxen, or ketoprofen unless instructed by your care team. These medications may hide a fever. Do not treat diarrhea with over the counter products. Contact your care team if you have diarrhea that lasts more than 2 days or if it is severe and watery. This medication can make you more sensitive to the sun. Keep out of the sun. If you cannot avoid being in the sun, wear protective clothing and sunscreen. Do not use sun lamps, tanning beds, or tanning booths. Talk to your care team if you or your partner wish to become pregnant or think you might be pregnant. This medication can cause serious birth defects if taken during pregnancy and for 3 months after the last dose. A reliable form of contraception is recommended while taking this medication and for 3 months after the last dose. Talk to your care team about effective forms of contraception. Do not father a child while taking this medication and for 3 months after the last dose. Use a condom while having sex during this time period. Do not  breastfeed while taking this medication. This medication may cause infertility. Talk to your care team if you are concerned about your fertility. What side effects may I notice from receiving this medication? Side effects that you should report to your care team as soon as possible: Allergic reactions--skin rash, itching, hives, swelling of the face, lips, tongue, or throat Heart attack--pain or tightness in the chest, shoulders, arms, or jaw, nausea, shortness of breath, cold or clammy skin, feeling faint or lightheaded Heart failure--shortness of breath, swelling of the ankles, feet, or hands, sudden weight gain, unusual weakness or fatigue Heart rhythm changes--fast or irregular heartbeat, dizziness, feeling faint or lightheaded, chest pain, trouble breathing High ammonia level--unusual weakness or fatigue, confusion, loss of appetite, nausea, vomiting, seizures Infection--fever, chills, cough, sore throat, wounds that don't heal, pain or trouble when passing urine, general feeling of discomfort or being unwell Low red blood cell level--unusual weakness or fatigue, dizziness, headache, trouble breathing Pain, tingling, or numbness in the hands or feet, muscle weakness, change in vision, confusion or trouble speaking, loss of balance or coordination, trouble walking, seizures Redness, swelling, and blistering of the skin over hands and feet Severe or prolonged diarrhea Unusual bruising or bleeding Side effects that usually do not require medical attention (report to your care team if they continue or are bothersome): Dry skin Headache Increased tears Nausea Pain, redness, or swelling with sores inside the mouth or throat Sensitivity to light Vomiting This list may not describe all possible side effects. Call your doctor for medical advice about side effects. You may report side effects to FDA at 1-800-FDA-1088. Where should I keep my medication? This medication is given in a hospital or  clinic. It will not be stored at home. NOTE: This sheet is a summary. It may not cover all possible information. If you have questions about this medicine, talk to your doctor, pharmacist, or health care provider.  2024 Elsevier/Gold Standard (  2021-12-20 00:00:00) 

## 2023-10-02 ENCOUNTER — Telehealth: Payer: 59 | Admitting: Adult Health

## 2023-10-02 ENCOUNTER — Other Ambulatory Visit: Payer: Self-pay

## 2023-10-02 ENCOUNTER — Encounter: Payer: Self-pay | Admitting: Adult Health

## 2023-10-02 DIAGNOSIS — F410 Panic disorder [episodic paroxysmal anxiety] without agoraphobia: Secondary | ICD-10-CM | POA: Diagnosis not present

## 2023-10-02 DIAGNOSIS — F429 Obsessive-compulsive disorder, unspecified: Secondary | ICD-10-CM

## 2023-10-02 DIAGNOSIS — G4700 Insomnia, unspecified: Secondary | ICD-10-CM

## 2023-10-02 DIAGNOSIS — F411 Generalized anxiety disorder: Secondary | ICD-10-CM

## 2023-10-02 DIAGNOSIS — F331 Major depressive disorder, recurrent, moderate: Secondary | ICD-10-CM | POA: Diagnosis not present

## 2023-10-02 MED ORDER — ALPRAZOLAM 1 MG PO TABS: ORAL_TABLET | ORAL | 2 refills | Status: DC

## 2023-10-02 NOTE — Progress Notes (Signed)
 Anthony Calhoun [id number] 17-Feb-1966 58 y.o.  Virtual Visit via Video Note  I connected with pt @ on 10/02/23 at  2:30 PM EST by a video enabled telemedicine application and verified that I am speaking with the correct person using two identifiers.   I discussed the limitations of evaluation and management by telemedicine and the availability of in person appointments. The patient expressed understanding and agreed to proceed.  I discussed the assessment and treatment plan with the patient. The patient was provided an opportunity to ask questions and all were answered. The patient agreed with the plan and demonstrated an understanding of the instructions.   The patient was advised to call back or seek an in-person evaluation if the symptoms worsen or if the condition fails to improve as anticipated.  I provided 25 minutes of non-face-to-face time during this encounter.  The patient was located at home.  The provider was located at [facility].   Angeline LOISE Sayers, NP   Subjective:   Patient ID:  Anthony Calhoun is a 58 y.o. (DOB November 12, 1965) male.  Chief Complaint: No chief complaint on file.   HPI Anthony Calhoun presents for follow-up of GAD, MDD, OCD, panic attacks.  Describes mood today as ok. Pleasant. Denies tearfulness. Mood symptoms - reports depression, anxiety, and irritability. Reports decreased interest and motivation. Reports panic attacks. Reports worry, rumination, and over thinking. Reports obsessive thoughts and acts. Mother diagnosed with cancer - 42 years old. Mood is variable. Stating I feel like I'm hanging in there. Feels like medications are helpful. Varying interest and motivation. Taking medications as prescribed.  Energy levels lower. Active, does not have a regular exercise routine.  Enjoys some usual interests and activities. Engaged. Lives with fiance - has an adult son and daughter - 2 grandchildren. Spending time with  family. Appetite improved. Weight gain - 233 to 247 pounds. Reports sleeping difficulties. Averges 4 hours at night. Reports daytime napping 5 to 6 hours. Focus and concentration stable. Completing tasks. Managing aspects of household. Out of work currently - totally disabled. Denies SI or HI.  Denies AH or VH. Denies self harm. Denies substance use.  Previous medication trials:  Celexa , Buspar  Review of Systems:  Review of Systems  Musculoskeletal:  Negative for gait problem.  Neurological:  Negative for tremors.  Psychiatric/Behavioral:         Please refer to HPI    Medications: I have reviewed the patient's current medications.  Current Outpatient Medications  Medication Sig Dispense Refill   Accu-Chek Softclix Lancets lancets SMARTSIG:Topical     acetaminophen  (TYLENOL ) 500 MG tablet Take 500 mg by mouth every 6 (six) hours as needed.     ALPRAZolam  (XANAX ) 1 MG tablet TAKE 1 TABLET BY MOUTH 3 TIMES A DAY AS NEEDED FOR ANXIETY 90 tablet 2   amLODipine  (NORVASC ) 5 MG tablet Take 5 mg by mouth daily.     Bempedoic Acid (NEXLETOL) 180 MG TABS Take 180 mg by mouth daily.     carvedilol  (COREG ) 6.25 MG tablet Take 6.25 mg by mouth 2 (two) times daily with a meal.     HYDROcodone -acetaminophen  (NORCO) 5-325 MG tablet Take 1 tablet by mouth every 12 (twelve) hours as needed for moderate pain. (Patient not taking: Reported on 10/01/2023) 30 tablet 0   LANTUS SOLOSTAR 100 UNIT/ML Solostar Pen Inject 5 Units into the skin daily. Taper up daily     lidocaine -prilocaine  (EMLA ) cream Apply 1 Application topically as needed. Apply 1/2  tablespoon to port site 1-2 hours prior to stick and cover w/Press-and-Seal to numb site for port access 30 g 0   magic mouthwash SOLN Take 5 mLs by mouth 4 (four) times daily as needed for mouth pain (swish and spit -maximum of 20 ml/24 hours). (Patient not taking: Reported on 10/01/2023) 240 mL 1   pantoprazole  (PROTONIX ) 40 MG tablet TAKE 1 TABLET BY MOUTH EVERY  DAY (Patient taking differently: Take 40 mg by mouth 2 (two) times daily.) 30 tablet 2   promethazine  (PHENERGAN ) 25 MG tablet TAKE 1 TABLET BY MOUTH EVERY 6 HOURS AS NEEDED FOR NAUSEA OR VOMITING. 60 tablet 1   sertraline  (ZOLOFT ) 100 MG tablet Take 1 tablet (100 mg total) by mouth daily. 90 tablet 3   triamcinolone  (KENALOG ) 0.1 % paste USE AS DIRECTED IN THE MOUTH OR THROAT 2 (TWO) TIMES DAILY AS NEEDED (ORAL IRRITATION). 5 g 1   No current facility-administered medications for this visit.   Facility-Administered Medications Ordered in Other Visits  Medication Dose Route Frequency Provider Last Rate Last Admin   sodium chloride  flush (NS) 0.9 % injection 10 mL  10 mL Intravenous PRN Sherrill, Gary B, MD   10 mL at 08/15/23 1101    Medication Side Effects: None  Allergies:  Allergies  Allergen Reactions   Chlorthalidone Swelling    Swelling of lips   Losartan Potassium-Hctz Swelling    Lip swelling   Cyclobenzaprine Other (See Comments)    Unknown reaction   Gabapentin  Nausea Only   Prednisone Anxiety    panic attack Panic attacks    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 30s; smoking hx   Ovarian cancer Maternal Aunt        d. after 47   Colon cancer Maternal Uncle        mother's pat half brother; dx unknown age   Lung cancer Half-Brother        mat half brother    Social History   Socioeconomic History   Marital status: Divorced    Spouse  name: Not on file   Number of children: Not on file   Years of education: Not on file   Highest education level: Not on file  Occupational History   Not on file  Tobacco Use   Smoking status: Never    Passive exposure: Past   Smokeless tobacco: Never  Vaping Use   Vaping status: Never Used  Substance and Sexual Activity   Alcohol use: No   Drug use: No   Sexual activity: Not Currently  Other Topics Concern   Not on file  Social History Narrative   Not on file   Social Drivers of Corporate Investment Banker  Strain: Medium Risk (01/17/2022)   Overall Financial Resource Strain (CARDIA)    Difficulty of Paying Living Expenses: Somewhat hard  Food Insecurity: No Food Insecurity (05/26/2022)   Hunger Vital Sign    Worried About Running Out of Food in the Last Year: Never true    Ran Out of Food in the Last Year: Never true  Transportation Needs: No Transportation Needs (05/26/2022)   PRAPARE - Administrator, Civil Service (Medical): No    Lack of Transportation (Non-Medical): No  Physical Activity: Inactive (01/17/2022)   Exercise Vital Sign    Days of Exercise per Week: 0 days    Minutes of Exercise per Session: 0 min  Stress: Stress Concern Present (01/17/2022)   [employer] of Occupational Health - Occupational Stress Questionnaire    Feeling of Stress : Rather much  Social Connections: Socially Isolated (01/17/2022)   Social Connection and Isolation Panel [NHANES]    Frequency of Communication with Friends and Family: Three times a week    Frequency of Social Gatherings with Friends and Family: Three times a week    Attends Religious Services: Never    Active Member of Clubs or Organizations: No    Attends Banker Meetings: Never    Marital Status: Divorced  Catering Manager Violence: Not At Risk (05/26/2022)   Humiliation, Afraid, Rape, and Kick questionnaire    Fear of Current or Ex-Partner: No    Emotionally Abused: No    Physically  Abused: No    Sexually Abused: No    Past Medical History, Surgical history, Social history, and Family history were reviewed and updated as appropriate.   Please see review of systems for further details on the patient's review from today.   Objective:   Physical Exam:  There were no vitals taken for this visit.  Physical Exam Constitutional:      General: He is not in acute distress. Musculoskeletal:        General: No deformity.  Neurological:     Mental Status: He is alert and oriented to person, place, and time.     Coordination: Coordination normal.  Psychiatric:        Attention and Perception: Attention and perception normal. He does not perceive auditory or visual hallucinations.        Mood and Affect: Affect is not labile, blunt, angry or inappropriate.        Speech: Speech normal.        Behavior: Behavior normal.        Thought Content: Thought content normal. Thought content is not paranoid or delusional. Thought content does not include homicidal or suicidal ideation. Thought content does not include homicidal or suicidal plan.        Cognition and Memory: Cognition and memory normal.        Judgment: Judgment normal.     Comments: Insight intact     Lab Review:     Component Value Date/Time   NA 136 10/01/2023 0927   K 3.5 10/01/2023 0927   CL 102 10/01/2023 0927   CO2 25 10/01/2023 0927   GLUCOSE 238 (H) 10/01/2023 0927   BUN 9 10/01/2023 0927   CREATININE 0.74 10/01/2023 0927   CALCIUM  8.4 (L) 10/01/2023 0927   PROT 6.9 10/01/2023 0927   ALBUMIN 3.6 10/01/2023 0927   AST 24 10/01/2023 0927   ALT 29 10/01/2023 0927   ALKPHOS 177 (H) 10/01/2023 0927   BILITOT 0.9 10/01/2023 0927   GFRNONAA >  60 10/01/2023 0927   GFRAA >60 05/10/2020 1543       Component Value Date/Time   WBC 6.8 10/01/2023 0927   WBC 8.4 05/27/2022 0527   RBC 4.04 (L) 10/01/2023 0927   HGB 9.4 (L) 10/01/2023 0927   HCT 32.5 (L) 10/01/2023 0927   PLT 127 (L) 10/01/2023 0927    MCV 80.4 10/01/2023 0927   MCH 23.3 (L) 10/01/2023 0927   MCHC 28.9 (L) 10/01/2023 0927   RDW 23.4 (H) 10/01/2023 0927   LYMPHSABS 0.9 10/01/2023 0927   MONOABS 0.3 10/01/2023 0927   EOSABS 0.1 10/01/2023 0927   BASOSABS 0.1 10/01/2023 0927    No results found for: POCLITH, LITHIUM   No results found for: PHENYTOIN, PHENOBARB, VALPROATE, CBMZ   .res Assessment: Plan:    Plan:  PDMP reviewed  Xanax  1mg  TID Zoloft  100mg  daily   RTC 6 months  Patient advised to contact office with any questions, adverse effects, or acute worsening in signs and symptoms.  Discussed potential benefits, risk, and side effects of benzodiazepines to include potential risk of tolerance and dependence, as well as possible drowsiness.  Advised patient not to drive if experiencing drowsiness and to take lowest possible effective dose to minimize risk of dependence and tolerance.   Time spent with patient was 25 minutes. Greater than 50% of face to face time with patient was spent on counseling and coordination of care.  There are no diagnoses linked to this encounter.   Please see After Visit Summary for patient specific instructions.  Future Appointments  Date Time Provider Department Center  10/02/2023  2:30 PM Jelena Malicoat, Angeline Mattocks, NP [facility] None  10/03/2023 11:45 AM [facility] INFUSION [facility] None  10/15/2023 10:00 AM [facility] PHLEBOTOMIST [facility] None  10/15/2023 10:15 AM [facility] INFUSION [facility] None  10/15/2023 10:20 AM Cloretta Arley NOVAK, MD [facility] None  10/15/2023 11:30 AM [facility] INFUSION [facility] None  10/17/2023  1:45 PM [facility] INFUS[facility] None  10/29/2023  8:30 AM [facility] PHLEBOTOMIST [facility] None  10/29/2023  8:45 AM [facility] INFUSION [facility] None  10/29/2023  9:15 AM Debby Olam POUR, NP [facility] None  10/29/2023 10:15 AM [facility] INFUSION [facility] None  10/31/2023  2:45 PM [facility] None    No orders of the defined types were placed  in this encounter.     -------------------------------

## 2023-10-03 ENCOUNTER — Inpatient Hospital Stay: Payer: 59

## 2023-10-03 VITALS — BP 118/63 | HR 80 | Temp 98.2°F | Resp 17

## 2023-10-03 DIAGNOSIS — C182 Malignant neoplasm of ascending colon: Secondary | ICD-10-CM | POA: Diagnosis not present

## 2023-10-03 DIAGNOSIS — D509 Iron deficiency anemia, unspecified: Secondary | ICD-10-CM | POA: Diagnosis not present

## 2023-10-03 DIAGNOSIS — Z5111 Encounter for antineoplastic chemotherapy: Secondary | ICD-10-CM | POA: Diagnosis not present

## 2023-10-03 DIAGNOSIS — C189 Malignant neoplasm of colon, unspecified: Secondary | ICD-10-CM

## 2023-10-03 DIAGNOSIS — Z79899 Other long term (current) drug therapy: Secondary | ICD-10-CM | POA: Diagnosis not present

## 2023-10-03 IMAGING — PT NM PET TUM IMG INITIAL (PI) SKULL BASE T - THIGH
1 of 7 series · 1 of 25 positions shown · non-contrast
Comparison: Multiple exams, including 03/07/2021

CLINICAL DATA: Subsequent treatment strategy for colon cancer.
Rising CEA level with circulating tumor DNA.

EXAM:
NUCLEAR MEDICINE PET SKULL BASE TO THIGH
TECHNIQUE: 13.7 mCi F-18 FDG was injected intravenously. Full-ring PET imaging
was performed from the skull base to thigh after the radiotracer. CT
data was obtained and used for attenuation correction and anatomic
localization.
Fasting blood glucose: 148 mg/dl

[Series 4: ct sk_thigh 5.0 br38 · axial · 5.0mm · 0.98mm/px · 1 of 243 slices shown]
[im 243/243  brain]
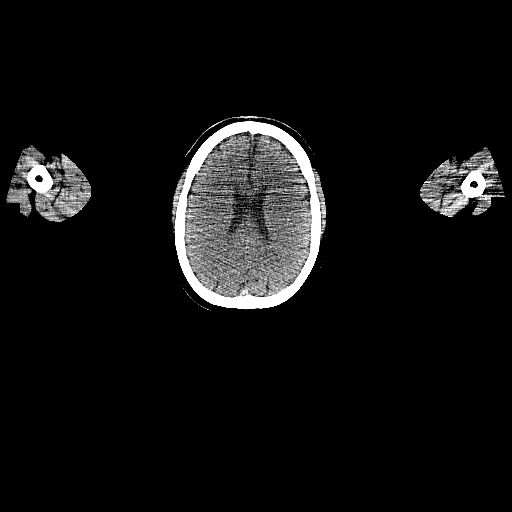

[1 of 25 positions shown; findings below may reference images not displayed]

FINDINGS: Mediastinal blood pool activity: SUV max

Liver activity: SUV max NA

NECK: No significant abnormal hypermetabolic activity in this
region.

Incidental CT findings: none

CHEST: No significant abnormal hypermetabolic activity in this
region.

Incidental CT findings: Left anterior descending and right coronary
artery atherosclerosis. Mild cardiomegaly.

ABDOMEN/PELVIS: A hypermetabolic right hepatic lobe lesion with
metabolic activity measuring about 1.8 by 1.4 by 2.3 cm is observed
with maximum SUV 11.8, compatible with a metastatic lesion to the
liver. This lesion is not conspicuous on the noncontrast CT data. No
other definite liver lesion is identified.

Accentuated activity noted focally in a loop of mid abdominal small
bowel about on image 130 of series 4 near the midline, maximum SUV
12.5, this could be from physiological peristaltic activity or tumor
along or within the small bowel at this location. There is some
other areas of accentuated metabolic activity including in the
sigmoid colon and transverse colon which are probably physiologic
given the lack of associated CT abnormality, although technically
nonspecific.

Incidental CT findings: Postoperative findings from partial right
colectomy. Atherosclerosis is present, including aortoiliac
atherosclerotic disease.

SKELETON: No significant focal abnormal skeletal activity.

Incidental CT findings: Thoracolumbar spondylosis.
IMPRESSION: 1. Hypermetabolic lesion in the right hepatic lobe with maximum SUV
11.8, compatible with metastatic disease to the liver.
2. Foci of accentuated activity in the bowel, most notably in the
mid abdominal loop of small bowel, probably physiologic although
strictly speaking tumor involvement within or along the central
abdominal loop of small bowel is difficult to totally exclude.
3. Other imaging findings of potential clinical significance: Aortic
Atherosclerosis ([id number]). Coronary atherosclerosis and mild
cardiomegaly. Thoracolumbar spondylosis.

## 2023-10-03 MED ORDER — HEPARIN SOD (PORK) LOCK FLUSH 100 UNIT/ML IV SOLN
500.0000 [IU] | Freq: Once | INTRAVENOUS | Status: AC | PRN
  Administered 2023-10-03: 500 [IU]

## 2023-10-03 MED ORDER — PEGFILGRASTIM-CBQV 6 MG/0.6ML ~~LOC~~ SOSY
6.0000 mg | PREFILLED_SYRINGE | Freq: Once | SUBCUTANEOUS | Status: AC
  Administered 2023-10-03: 6 mg via SUBCUTANEOUS
  Filled 2023-10-03: qty 0.6

## 2023-10-03 MED ORDER — SODIUM CHLORIDE 0.9% FLUSH
10.0000 mL | INTRAVENOUS | Status: DC | PRN
  Administered 2023-10-03: 10 mL

## 2023-10-08 ENCOUNTER — Telehealth: Payer: Self-pay

## 2023-10-08 NOTE — Telephone Encounter (Signed)
 The patient contacted us  to report that he consumed a raspberry filling donut last night. About an hour later, he experienced a bowel movement that appeared to contain blood. He has only had one episode and did not experience any abdominal pain. This morning, he had a normal bowel movement. I advised the patient to continue monitoring his bowel movements and to reach out to us  if he notices any blood in his stool in the future. I also encouraged him to keep his upcoming appointment, where we can discuss this matter further if needed. The patient acknowledged understanding and had no additional questions or concerns.

## 2023-10-12 ENCOUNTER — Other Ambulatory Visit: Payer: Self-pay | Admitting: Oncology

## 2023-10-15 ENCOUNTER — Inpatient Hospital Stay: Payer: 59

## 2023-10-15 ENCOUNTER — Inpatient Hospital Stay (HOSPITAL_BASED_OUTPATIENT_CLINIC_OR_DEPARTMENT_OTHER): Payer: 59 | Admitting: Oncology

## 2023-10-15 VITALS — BP 124/68 | HR 94 | Temp 98.1°F | Resp 18 | Ht 66.0 in | Wt 244.8 lb

## 2023-10-15 DIAGNOSIS — Z5111 Encounter for antineoplastic chemotherapy: Secondary | ICD-10-CM | POA: Diagnosis not present

## 2023-10-15 DIAGNOSIS — C189 Malignant neoplasm of colon, unspecified: Secondary | ICD-10-CM

## 2023-10-15 DIAGNOSIS — C787 Secondary malignant neoplasm of liver and intrahepatic bile duct: Secondary | ICD-10-CM | POA: Diagnosis not present

## 2023-10-15 DIAGNOSIS — D509 Iron deficiency anemia, unspecified: Secondary | ICD-10-CM | POA: Diagnosis not present

## 2023-10-15 DIAGNOSIS — Z79899 Other long term (current) drug therapy: Secondary | ICD-10-CM | POA: Diagnosis not present

## 2023-10-15 DIAGNOSIS — C182 Malignant neoplasm of ascending colon: Secondary | ICD-10-CM | POA: Diagnosis not present

## 2023-10-15 LAB — CMP (CANCER CENTER ONLY)
ALT: 32 U/L (ref 0–44)
AST: 30 U/L (ref 15–41)
Albumin: 3.7 g/dL (ref 3.5–5.0)
Alkaline Phosphatase: 155 U/L — ABNORMAL HIGH (ref 38–126)
Anion gap: 10 (ref 5–15)
BUN: 8 mg/dL (ref 6–20)
CO2: 26 mmol/L (ref 22–32)
Calcium: 8.8 mg/dL — ABNORMAL LOW (ref 8.9–10.3)
Chloride: 100 mmol/L (ref 98–111)
Creatinine: 0.78 mg/dL (ref 0.61–1.24)
GFR, Estimated: >60 mL/min (ref >60)
Glucose, Bld: 293 mg/dL — ABNORMAL HIGH (ref 70–99)
Potassium: 3.8 mmol/L (ref 3.5–5.1)
Sodium: 136 mmol/L (ref 135–145)
Total Bilirubin: 1.2 mg/dL (ref 0.0–1.2)
Total Protein: 7.4 g/dL (ref 6.5–8.1)

## 2023-10-15 LAB — CBC WITH DIFFERENTIAL (CANCER CENTER ONLY)
Abs Immature Granulocytes: 0.02 10*3/uL (ref 0.00–0.07)
Basophils Absolute: 0.1 10*3/uL (ref 0.0–0.1)
Basophils Relative: 1 %
Eosinophils Absolute: 0.1 10*3/uL (ref 0.0–0.5)
Eosinophils Relative: 2 %
HCT: 35.2 % — ABNORMAL LOW (ref 39.0–52.0)
Hemoglobin: 10.3 g/dL — ABNORMAL LOW (ref 13.0–17.0)
Immature Granulocytes: 0 %
Lymphocytes Relative: 17 %
Lymphs Abs: 0.9 10*3/uL (ref 0.7–4.0)
MCH: 23.7 pg — ABNORMAL LOW (ref 26.0–34.0)
MCHC: 29.3 g/dL — ABNORMAL LOW (ref 30.0–36.0)
MCV: 81.1 fL (ref 80.0–100.0)
Monocytes Absolute: 0.4 10*3/uL (ref 0.1–1.0)
Monocytes Relative: 7 %
Neutro Abs: 3.7 10*3/uL (ref 1.7–7.7)
Neutrophils Relative %: 73 %
Platelet Count: 167 10*3/uL (ref 150–400)
RBC: 4.34 MIL/uL (ref 4.22–5.81)
RDW: 24 % — ABNORMAL HIGH (ref 11.5–15.5)
WBC Count: 5.1 10*3/uL (ref 4.0–10.5)
nRBC: 0 % (ref 0.0–0.2)

## 2023-10-15 LAB — CEA (ACCESS): CEA (CHCC): 11.01 ng/mL — ABNORMAL HIGH (ref 0.00–5.00)

## 2023-10-15 LAB — TOTAL PROTEIN, URINE DIPSTICK: Protein, ur: NEGATIVE mg/dL

## 2023-10-15 LAB — FERRITIN: Ferritin: 88 ng/mL (ref 24–336)

## 2023-10-15 MED ORDER — HEPARIN SOD (PORK) LOCK FLUSH 100 UNIT/ML IV SOLN
500.0000 [IU] | Freq: Once | INTRAVENOUS | Status: AC
  Administered 2023-10-15: 500 [IU] via INTRAVENOUS

## 2023-10-15 MED ORDER — SODIUM CHLORIDE 0.9% FLUSH
10.0000 mL | INTRAVENOUS | Status: DC | PRN
  Administered 2023-10-15: 10 mL via INTRAVENOUS

## 2023-10-15 NOTE — Progress Notes (Signed)
[facility] OFFICE PROGRESS NOTE   Diagnosis: Colon cancer  INTERVAL HISTORY:   Anthony Calhoun completed another cycle of FOLFIRI/bevacizumab 10/01/2023.  He had less nausea following this cycle of chemotherapy.  He continues to have mouth soreness.  He reports 2 episodes of rectal bleeding.  He has altered taste.  Objective:  Vital signs in last 24 hours:  Blood pressure 124/68, pulse 94, temperature 98.1 F (36.7 C), temperature source Temporal, resp. rate 18, height 5\' 6"  (1.676 m), weight 244 lb 12.8 oz (111 kg), SpO2 98%.    HEENT: No thrush or ulcers Resp: Lungs clear bilaterally Cardio: Regular rate and rhythm GI: No hepatosplenomegaly, no mass, nontender Vascular: No leg edema   Portacath/PICC-without erythema  Lab Results:  Lab Results  Component Value Date   WBC 5.1 10/15/2023   HGB 10.3 (L) 10/15/2023   HCT 35.2 (L) 10/15/2023   MCV 81.1 10/15/2023   PLT 167 10/15/2023   NEUTROABS 3.7 10/15/2023    CMP  Lab Results  Component Value Date   NA 136 10/01/2023   K 3.5 10/01/2023   CL 102 10/01/2023   CO2 25 10/01/2023   GLUCOSE 238 (H) 10/01/2023   BUN 9 10/01/2023   CREATININE 0.74 10/01/2023   CALCIUM 8.4 (L) 10/01/2023   PROT 6.9 10/01/2023   ALBUMIN 3.6 10/01/2023   AST 24 10/01/2023   ALT 29 10/01/2023   ALKPHOS 177 (H) 10/01/2023   BILITOT 0.9 10/01/2023   GFRNONAA >60 10/01/2023   GFRAA >60 05/10/2020    Lab Results  Component Value Date   CEA1 1.08 03/07/2021   CEA 11.21 (H) 10/01/2023     Medications: I have reviewed the patient's current medications.   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 secondary to nausea (patient discontinued Xeloda on day 11 due to colonoscopy) Colonoscopy 07/09/2020-nodular mucosa at the colonic anastomosis, biopsied; patent end-to-side ileocolonic anastomosis characterized by healthy-appearing mucosa and visible sutures; nonbleeding internal hemorrhoids, biopsy with benign ulcerated anastomotic mucosa with granulation tissue Cycle 4 adjuvant Xeloda 07/19/2020, 2000 mg every morning and 1500 mg every afternoon Cycle 5 adjuvant Xeloda 08/09/2020 Cycle 6 adjuvant Xeloda 08/31/2019 Cycle 7 adjuvant Xeloda 09/20/2020 Cycle 8 adjuvant Xeloda 10/11/2020 CTs 03/07/2021-no evidence of recurrent disease, hepatic steatosis, slight wall thickening at the junction of the descending and horizontal duodenum Mild elevation of CEA 2023 12/01/2021-Guardant Reveal-ct DNA detected PET 12/30/2021-hypermetabolic right liver lesion, hypermetabolic area in a loop of mid small bowel with an SUV of 12.5, review of PET images at GI tumor conference 01/11/2022-small bowel uptake felt to be a benign finding MRI liver 01/10/2022-solitary 1.2 cm right liver lesion between segments 7 and 8 compatible with metastasis, subtle changes suggesting cirrhosis, hepatic steatosis CT enteroscopy 02/02/2022-small bowel loop with hypermetabolic activity on PET continues to have a bandlike density along the margin felt to represent a diverticulum or adjacent nodal tissue.  Small bowel tumor not excluded.  3-4 mm left omental nodule 02/08/2022-biopsy and ablation of solitary liver lesion, adenocarcinoma consistent with metastatic colorectal adenocarcinoma CTs 03/31/2022-unchanged circumstantial soft tissue thickening surrounding a loop of  mid to distal small bowel with an adjacent nodular focus measuring 1.2 cm.,  Ablation cavity in segment 7, no other evidence  of metastatic disease Diagnostic laparoscopy 05/26/2022-mid small bowel mass-resected, well to moderately differentiated adenocarcinoma, 0/2 lymph nodes, morphology consistent with a colon primary, tumor involved full-thickness including serosa with perineural and large vessel involvement; foundation 1-microsatellite stable, tumor mutation burden 2, K-ras Q61H CT abdomen/pelvis 07/10/2022-the right liver ablation defect is smaller, new subtle right liver lesion measuring 13 x 9 mm, enlarged lymph node in the right upper quadrant mesentery adjacent to surgical anastomosis PET 07/26/2022-new segment 6 liver lesion, enlarging hypermetabolic left omental lesion, there is another mildly hypermetabolic peritoneal implant, 7 mm omental nodule at the midline without hypermetabolism Cycle 1 FOLFOX 09/25/2022 Cycle 2 FOLFOX/bevacizumab 10/09/2022, 5-FU dose reduced secondary to mucositis Cycle 3 FOLFOX/bevacizumab 10/23/2022 Cycle 4 FOLFOX/bevacizumab 11/06/2022 Cycle 5 FOLFOX/bevacizumab 11/20/2022 Cycle 6 FOLFOX/bevacizumab 12/04/2022 CT abdomen/pelvis 12/14/2022-further contraction of the treated segment 7 lesion, slight enlargement of segment 6 lesion, no new liver lesion, increased size of ileocolic nodes and an omental lesion, stable small retroperitoneal nodes Cycle 7 FOLFOX/bevacizumab 12/18/2022 Cycle 8 FOLFOX/bevacizumab 01/02/2023, oxaliplatin dose reduced secondary to prolonged nausea and neuropathy symptoms, Emend added Cycle 9 FOLFOX/bevacizumab 01/16/2023, oxaliplatin discontinued secondary to neuropathy Cycle 1 FOLFIRI 02/19/2023, Avastin held due to recent dental extractions Cycle 2 FOLFIRI/Avastin 03/05/2023 Cycle 3 FOLFIRI/Avastin 03/19/2023, 5-FU bolus held due to mucositis after cycle 2, Fulphila added Cycle 4 FOLFIRI/Avastin 04/02/2023, Fulphila, Emend Cycle 5 FOLFIRI/Avastin 04/16/2023, Fulphila, Emend CTs 05/02/2023-similar ileocolic mesenteric adenopathy and omental metastasis, cirrhosis with portal  hypertension, stable liver lesions, no new or progressive disease Cycle 6 FOLFIRI/Avastin 05/07/2023, Fulphila, Emend Cycle 7 FOLFIRI/Avastin 05/21/2023, Fulphila, Emend Cycle 8 FOLFIRI 06/04/2023, Fulphila, Emend; Avastin held Cycle 9 FOLFIRI 06/18/2023, [name], Emend, Avastin held Treatment held 07/09/2023 patient request Cycle 10 FOLFIRI 07/18/2023, Fulphila, Emend, Avastin held CTs 07/31/2023: Stable with unchanged hepatic lesions, mesenteric lymph nodes, and left omental nodule, unchanged morphologic changes of cirrhosis and portal hypertension Cycle 11 FOLFIRI/Avastin 08/01/2023, Fulphila Chemotherapy held 08/15/2023 per patient request Cycle 12 FOLFIRI/Avastin 08/30/2023, Udenyca Cycle 13 FOLFIRI/Avastin 09/19/2023, Udenyca Cycle 14 FOLFIRI/Avastin 10/01/2023, Udenyca Cycle 15 FOLFIRI/Avastin 10/22/2023, Udenyca Microcytic anemia secondary to #1-improved Colon polyps-sessile serrated adenoma of the descending colon, tubular adenomas with focal high-grade dysplasia of the transverse colon on colonoscopy 02/12/2020, tubular adenoma of the mid ascending colon on the surgical specimen 04/12/2020 Family history of colon cancer Depression Anxiety/panic attacks Hypertension Hyperlipidemia Nausea secondary to Xeloda?-Resolved Gastroesophageal reflux disease-improved following discontinuation of Xeloda Anemia, microcytic; ferritin 6 01/24/2022; stool positive for blood x3 01/25/2022; 02/02/2022 CT abdomen/pelvis enterography-bowel loop demonstrating hypermetabolic activity continues to have a bandlike density along its margin possibly representing a diverticulum or adjacent nodal tissue, difficult to exclude small bowel tumor, 3 x 4 mm left omental nodule or lymph node, known right hepatic lobe tumor is occult on CT. Venofer weekly x3 beginning 03/03/2022 Negative capsule endoscopy 03/09/2022 Venofer 03/03/2022 for 3 weekly doses Venofer 05/03/2022, 05/19/2022 Venofer 07/28/2022, 08/04/2022 Venofer 03/22/2023,  03/29/2023, 04/04/2023 Venofer 06/18/2023, 07/09/2023 07/03/2023 upper endoscopy and colonoscopy-severe portal hypertensive gastropathy in the gastric fundus, gastric body, stomach, red blood found greater curvature of the stomach and gastric antrum, treated with APC; colonoscopy showed dark old maroon-colored blood in the entire colon.  No evidence of active or recent bleeding in the colon. 12.  Anterior left neck nodule 03/17/2022-cyst?,  Lymph node? 13.  Mucositis secondary to chemotherapy at office visit 10/03/2022-the 5-FU will be dose reduced with cycle  2 FOLFOX 14.  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  Oxaliplatin neuropathy-mild loss of vibratory sense 12/04/2022, 12/18/2022, 01/02/2023     Disposition: [name] appears stable.  He continues to tolerate the FOLFIRI/bevacizumab well.  The CEA was lower on 10/01/2023.  He would like to hold chemotherapy this week due to the predicted snow over the next few days and malaise.  He will be scheduled for the next cycle of FOLFIRI/bevacizumab on 10/22/2023.  He reports "straining "prior to the rectal bleeding last week.  He will call for recurrent bleeding.  The plan is to refer him for restaging CTs after the cycle of chemotherapy 11/05/2023.  Thornton Papas, MD  10/15/2023  10:49 AM

## 2023-10-15 NOTE — Progress Notes (Signed)
Mr. Stoudt decided not to have tx today.

## 2023-10-16 ENCOUNTER — Other Ambulatory Visit: Payer: Self-pay

## 2023-10-19 ENCOUNTER — Encounter: Payer: Self-pay | Admitting: *Deleted

## 2023-10-19 NOTE — Progress Notes (Signed)
Per Dr. Truett Perna: Needs CBC/diff only again on 10/22/23. Placed note for scheduler to add appointments to 2/24 first thing Monday.

## 2023-10-22 ENCOUNTER — Inpatient Hospital Stay: Payer: 59

## 2023-10-22 ENCOUNTER — Encounter: Payer: Self-pay | Admitting: Oncology

## 2023-10-22 VITALS — BP 126/73 | HR 75 | Resp 18

## 2023-10-22 DIAGNOSIS — Z79899 Other long term (current) drug therapy: Secondary | ICD-10-CM | POA: Diagnosis not present

## 2023-10-22 DIAGNOSIS — Z5111 Encounter for antineoplastic chemotherapy: Secondary | ICD-10-CM | POA: Diagnosis not present

## 2023-10-22 DIAGNOSIS — C189 Malignant neoplasm of colon, unspecified: Secondary | ICD-10-CM

## 2023-10-22 DIAGNOSIS — D509 Iron deficiency anemia, unspecified: Secondary | ICD-10-CM | POA: Diagnosis not present

## 2023-10-22 DIAGNOSIS — C182 Malignant neoplasm of ascending colon: Secondary | ICD-10-CM | POA: Diagnosis not present

## 2023-10-22 LAB — CBC WITH DIFFERENTIAL (CANCER CENTER ONLY)
Abs Immature Granulocytes: 0.03 10*3/uL (ref 0.00–0.07)
Basophils Absolute: 0.1 10*3/uL (ref 0.0–0.1)
Basophils Relative: 1 %
Eosinophils Absolute: 0.1 10*3/uL (ref 0.0–0.5)
Eosinophils Relative: 2 %
HCT: 33.8 % — ABNORMAL LOW (ref 39.0–52.0)
Hemoglobin: 10.2 g/dL — ABNORMAL LOW (ref 13.0–17.0)
Immature Granulocytes: 1 %
Lymphocytes Relative: 16 %
Lymphs Abs: 1 10*3/uL (ref 0.7–4.0)
MCH: 24.2 pg — ABNORMAL LOW (ref 26.0–34.0)
MCHC: 30.2 g/dL (ref 30.0–36.0)
MCV: 80.1 fL (ref 80.0–100.0)
Monocytes Absolute: 0.5 10*3/uL (ref 0.1–1.0)
Monocytes Relative: 8 %
Neutro Abs: 4.3 10*3/uL (ref 1.7–7.7)
Neutrophils Relative %: 72 %
Platelet Count: 200 10*3/uL (ref 150–400)
RBC: 4.22 MIL/uL (ref 4.22–5.81)
RDW: 22.3 % — ABNORMAL HIGH (ref 11.5–15.5)
WBC Count: 6 10*3/uL (ref 4.0–10.5)
nRBC: 0 % (ref 0.0–0.2)

## 2023-10-22 LAB — CMP (CANCER CENTER ONLY)
ALT: 29 U/L (ref 0–44)
AST: 28 U/L (ref 15–41)
Albumin: 4 g/dL (ref 3.5–5.0)
Alkaline Phosphatase: 160 U/L — ABNORMAL HIGH (ref 38–126)
Anion gap: 8 (ref 5–15)
BUN: 14 mg/dL (ref 6–20)
CO2: 26 mmol/L (ref 22–32)
Calcium: 8.9 mg/dL (ref 8.9–10.3)
Chloride: 97 mmol/L — ABNORMAL LOW (ref 98–111)
Creatinine: 0.78 mg/dL (ref 0.61–1.24)
GFR, Estimated: >60 mL/min (ref >60)
Glucose, Bld: 370 mg/dL — ABNORMAL HIGH (ref 70–99)
Potassium: 4.1 mmol/L (ref 3.5–5.1)
Sodium: 131 mmol/L — ABNORMAL LOW (ref 135–145)
Total Bilirubin: 1.1 mg/dL (ref 0.0–1.2)
Total Protein: 7.3 g/dL (ref 6.5–8.1)

## 2023-10-22 LAB — TOTAL PROTEIN, URINE DIPSTICK: Protein, ur: NEGATIVE mg/dL

## 2023-10-22 LAB — CEA (ACCESS): CEA (CHCC): 13.4 ng/mL — ABNORMAL HIGH (ref 0.00–5.00)

## 2023-10-22 MED ORDER — FOSAPREPITANT DIMEGLUMINE INJECTION 150 MG
150.0000 mg | Freq: Once | INTRAVENOUS | Status: AC
  Administered 2023-10-22: 150 mg via INTRAVENOUS
  Filled 2023-10-22: qty 150

## 2023-10-22 MED ORDER — PALONOSETRON HCL INJECTION 0.25 MG/5ML
0.2500 mg | Freq: Once | INTRAVENOUS | Status: AC
Start: 2023-10-22 — End: 2023-10-22
  Administered 2023-10-22: 0.25 mg via INTRAVENOUS
  Filled 2023-10-22: qty 5

## 2023-10-22 MED ORDER — SODIUM CHLORIDE 0.9 % IV SOLN
1600.0000 mg/m2 | INTRAVENOUS | Status: DC
  Administered 2023-10-22: 3500 mg via INTRAVENOUS
  Filled 2023-10-22: qty 70

## 2023-10-22 MED ORDER — SODIUM CHLORIDE 0.9 % IV SOLN
180.0000 mg/m2 | Freq: Once | INTRAVENOUS | Status: AC
  Administered 2023-10-22: 400 mg via INTRAVENOUS
  Filled 2023-10-22: qty 15

## 2023-10-22 MED ORDER — FLUOROURACIL CHEMO INJECTION 500 MG/10ML
200.0000 mg/m2 | Freq: Once | INTRAVENOUS | Status: AC
  Administered 2023-10-22: 450 mg via INTRAVENOUS
  Filled 2023-10-22: qty 9

## 2023-10-22 MED ORDER — SODIUM CHLORIDE 0.9 % IV SOLN
5.0000 mg/kg | Freq: Once | INTRAVENOUS | Status: AC
  Administered 2023-10-22: 500 mg via INTRAVENOUS
  Filled 2023-10-22: qty 16

## 2023-10-22 MED ORDER — ATROPINE SULFATE 1 MG/ML IV SOLN
0.5000 mg | Freq: Once | INTRAVENOUS | Status: AC | PRN
  Administered 2023-10-22: 0.5 mg via INTRAVENOUS
  Filled 2023-10-22: qty 1

## 2023-10-22 MED ORDER — SODIUM CHLORIDE 0.9 % IV SOLN: Freq: Once | INTRAVENOUS | Status: AC

## 2023-10-22 MED ORDER — DEXAMETHASONE SODIUM PHOSPHATE 10 MG/ML IJ SOLN
10.0000 mg | Freq: Once | INTRAMUSCULAR | Status: AC
Start: 2023-10-22 — End: 2023-10-22
  Administered 2023-10-22: 10 mg via INTRAVENOUS
  Filled 2023-10-22: qty 1

## 2023-10-22 MED ORDER — SODIUM CHLORIDE 0.9 % IV SOLN
200.0000 mg/m2 | Freq: Once | INTRAVENOUS | Status: AC
  Administered 2023-10-22: 444 mg via INTRAVENOUS
  Filled 2023-10-22: qty 22.2

## 2023-10-22 NOTE — Patient Instructions (Signed)
 [facility] - A DEPT OF [facility]   Discharge Instructions: Thank you for choosing [facility] to provide your oncology and hematology care.   If you have a lab appointment with the Cancer Center, please go directly to the Cancer Center and check in at the registration area.   Wear comfortable clothing and clothing appropriate for easy access to any Portacath or PICC line.   We strive to give you quality time with your provider. You may need to reschedule your appointment if you arrive late (15 or more minutes).  Arriving late affects you and other patients whose appointments are after yours.  Also, if you miss three or more appointments without notifying the office, you may be dismissed from the clinic at the provider's discretion.      For prescription refill requests, have your pharmacy contact our office and allow 72 hours for refills to be completed.    Today you received the following chemotherapy and/or immunotherapy agents Bevacizumab-awwb (MVASI), Irinotecan (CAMPTOSAR), Leucovorin & Flourouracil (ADRUCIL).      To help prevent nausea and vomiting after your treatment, we encourage you to take your nausea medication as directed.  BELOW ARE SYMPTOMS THAT SHOULD BE REPORTED IMMEDIATELY: *FEVER GREATER THAN 100.4 F (38 C) OR HIGHER *CHILLS OR SWEATING *NAUSEA AND VOMITING THAT IS NOT CONTROLLED WITH YOUR NAUSEA MEDICATION *UNUSUAL SHORTNESS OF BREATH *UNUSUAL BRUISING OR BLEEDING *URINARY PROBLEMS (pain or burning when urinating, or frequent urination) *BOWEL PROBLEMS (unusual diarrhea, constipation, pain near the anus) TENDERNESS IN MOUTH AND THROAT WITH OR WITHOUT PRESENCE OF ULCERS (sore throat, sores in mouth, or a toothache) UNUSUAL RASH, SWELLING OR PAIN  UNUSUAL VAGINAL DISCHARGE OR ITCHING   Items with * indicate a potential emergency and should be followed up as soon as possible or go to the Emergency Department if any  problems should occur.  Please show the CHEMOTHERAPY ALERT CARD or IMMUNOTHERAPY ALERT CARD at check-in to the Emergency Department and triage nurse.  Should you have questions after your visit or need to cancel or reschedule your appointment, please contact [facility]  Dept: [phone]  and follow the prompts.  Office hours are 8:00 a.m. to 4:30 p.m. Monday - Friday. Please note that voicemails left after 4:00 p.m. may not be returned until the following business day.  We are closed weekends and major holidays. You have access to a nurse at all times for urgent questions. Please call the main number to the clinic Dept: [phone] and follow the prompts.   For any non-urgent questions, you may also contact your provider using MyChart. We now offer e-Visits for anyone 61 and older to request care online for non-urgent symptoms. For details visit [email or web address].   Also download the MyChart app! Go to the app store, search "MyChart", open the app, select [facility], and log in with your MyChart username and password.  Bevacizumab Injection What is this medication? BEVACIZUMAB (be va SIZ yoo mab) treats some types of cancer. It works by blocking a protein that causes cancer cells to grow and multiply. This helps to slow or stop the spread of cancer cells. It is a monoclonal antibody. This medicine may be used for other purposes; ask your health care provider or pharmacist if you have questions. COMMON BRAND NAME(S): Alymsys, Avastin, MVASI, Ros[place] Lao What should I tell my care team before I take this medication? They need  to know if you have any of these conditions: Blood clots Coughing up blood Having or recent surgery Heart failure High blood pressure History of a connection between 2 or more body parts that do not usually connect (fistula) History of a tear in your stomach or intestines Protein in your urine An  unusual or allergic reaction to bevacizumab, other medications, foods, dyes, or preservatives Pregnant or trying to get pregnant Breast-feeding How should I use this medication? This medication is injected into a vein. It is given by your care team in a hospital or clinic setting. Talk to your care team the use of this medication in children. Special care may be needed. Overdosage: If you think you have taken too much of this medicine contact a poison control center or emergency room at once. NOTE: This medicine is only for you. Do not share this medicine with others. What if I miss a dose? Keep appointments for follow-up doses. It is important not to miss your dose. Call your care team if you are unable to keep an appointment. What may interact with this medication? Interactions are not expected. This list may not describe all possible interactions. Give your health care provider a list of all the medicines, herbs, non-prescription drugs, or dietary supplements you use. Also tell them if you smoke, drink alcohol, or use illegal drugs. Some items may interact with your medicine. What should I watch for while using this medication? Your condition will be monitored carefully while you are receiving this medication. You may need blood work while taking this medication. This medication may make you feel generally unwell. This is not uncommon as chemotherapy can affect healthy cells as well as cancer cells. Report any side effects. Continue your course of treatment even though you feel ill unless your care team tells you to stop. This medication may increase your risk to bruise or bleed. Call your care team if you notice any unusual bleeding. Before having surgery, talk to your care team to make sure it is ok. This medication can increase the risk of poor healing of your surgical site or wound. You will need to stop this medication for 28 days before surgery. After surgery, wait at least 28 days before  restarting this medication. Make sure the surgical site or wound is healed enough before restarting this medication. Talk to your care team if questions. Talk to your care team if you may be pregnant. Serious birth defects can occur if you take this medication during pregnancy and for 6 months after the last dose. Contraception is recommended while taking this medication and for 6 months after the last dose. Your care team can help you find the option that works for you. Do not breastfeed while taking this medication and for 6 months after the last dose. This medication can cause infertility. Talk to your care team if you are concerned about your fertility. What side effects may I notice from receiving this medication? Side effects that you should report to your care team as soon as possible: Allergic reactions--skin rash, itching, hives, swelling of the face, lips, tongue, or throat Bleeding--bloody or black, tar-like stools, vomiting blood or brown material that looks like coffee grounds, red or dark brown urine, small red or purple spots on skin, unusual bruising or bleeding Blood clot--pain, swelling, or warmth in the leg, shortness of breath, chest pain Heart attack--pain or tightness in the chest, shoulders, arms, or jaw, nausea, shortness of breath, cold or clammy skin, feeling faint  or lightheaded Heart failure--shortness of breath, swelling of the ankles, feet, or hands, sudden weight gain, unusual weakness or fatigue Increase in blood pressure Infection--fever, chills, cough, sore throat, wounds that don't heal, pain or trouble when passing urine, general feeling of discomfort or being unwell Infusion reactions--chest pain, shortness of breath or trouble breathing, feeling faint or lightheaded Kidney injury--decrease in the amount of urine, swelling of the ankles, hands, or feet Stomach pain that is severe, does not go away, or gets worse Stroke--sudden numbness or weakness of the face,  arm, or leg, trouble speaking, confusion, trouble walking, loss of balance or coordination, dizziness, severe headache, change in vision Sudden and severe headache, confusion, change in vision, seizures, which may be signs of posterior reversible encephalopathy syndrome (PRES) Side effects that usually do not require medical attention (report to your care team if they continue or are bothersome): Back pain Change in taste Diarrhea Dry skin Increased tears Nosebleed This list may not describe all possible side effects. Call your doctor for medical advice about side effects. You may report side effects to FDA at 1-800-FDA-1088. Where should I keep my medication? This medication is given in a hospital or clinic. It will not be stored at home. NOTE: This sheet is a summary. It may not cover all possible information. If you have questions about this medicine, talk to your doctor, pharmacist, or health care provider.  2024 Elsevier/Gold Standard (2021-12-30 00:00:00)  Irinotecan Injection What is this medication? IRINOTECAN (ir in oh TEE kan) treats some types of cancer. It works by slowing down the growth of cancer cells. This medicine may be used for other purposes; ask your health care provider or pharmacist if you have questions. COMMON BRAND NAME(S): Camptosar What should I tell my care team before I take this medication? They need to know if you have any of these conditions: Dehydration Diarrhea Infection, especially a viral infection, such as chickenpox, cold sores, herpes Liver disease Low blood cell levels (white cells, red cells, and platelets) Low levels of electrolytes, such as calcium, magnesium, or potassium in your blood Recent or ongoing radiation An unusual or allergic reaction to irinotecan, other medications, foods, dyes, or preservatives If you or your partner are pregnant or trying to get pregnant Breast-feeding How should I use this medication? This medication is  injected into a vein. It is given by your care team in a hospital or clinic setting. Talk to your care team about the use of this medication in children. Special care may be needed. Overdosage: If you think you have taken too much of this medicine contact a poison control center or emergency room at once. NOTE: This medicine is only for you. Do not share this medicine with others. What if I miss a dose? Keep appointments for follow-up doses. It is important not to miss your dose. Call your care team if you are unable to keep an appointment. What may interact with this medication? Do not take this medication with any of the following: Cobicistat Itraconazole This medication may also interact with the following: Certain antibiotics, such as clarithromycin, rifampin, rifabutin Certain antivirals for HIV or AIDS Certain medications for fungal infections, such as ketoconazole, posaconazole, voriconazole Certain medications for seizures, such as carbamazepine, phenobarbital, phenytoin Gemfibrozil Nefazodone St. John's wort This list may not describe all possible interactions. Give your health care provider a list of all the medicines, herbs, non-prescription drugs, or dietary supplements you use. Also tell them if you smoke, drink alcohol, or use  illegal drugs. Some items may interact with your medicine. What should I watch for while using this medication? Your condition will be monitored carefully while you are receiving this medication. You may need blood work while taking this medication. This medication may make you feel generally unwell. This is not uncommon as chemotherapy can affect healthy cells as well as cancer cells. Report any side effects. Continue your course of treatment even though you feel ill unless your care team tells you to stop. This medication can cause serious side effects. To reduce the risk, your care team may give you other medications to take before receiving this one. Be  sure to follow the directions from your care team. This medication may affect your coordination, reaction time, or judgement. Do not drive or operate machinery until you know how this medication affects you. Sit up or stand slowly to reduce the risk of dizzy or fainting spells. Drinking alcohol with this medication can increase the risk of these side effects. This medication may increase your risk of getting an infection. Call your care team for advice if you get a fever, chills, sore throat, or other symptoms of a cold or flu. Do not treat yourself. Try to avoid being around people who are sick. Avoid taking medications that contain aspirin, acetaminophen, ibuprofen, naproxen, or ketoprofen unless instructed by your care team. These medications may hide a fever. This medication may increase your risk to bruise or bleed. Call your care team if you notice any unusual bleeding. Be careful brushing or flossing your teeth or using a toothpick because you may get an infection or bleed more easily. If you have any dental work done, tell your dentist you are receiving this medication. Talk to your care team if you or your partner are pregnant or think either of you might be pregnant. This medication can cause serious birth defects if taken during pregnancy and for 6 months after the last dose. You will need a negative pregnancy test before starting this medication. Contraception is recommended while taking this medication and for 6 months after the last dose. Your care team can help you find the option that works for you. Do not father a child while taking this medication and for 3 months after the last dose. Use a condom for contraception during this time period. Do not breastfeed while taking this medication and for 7 days after the last dose. This medication may cause infertility. Talk to your care team if you are concerned about your fertility. What side effects may I notice from receiving this  medication? Side effects that you should report to your care team as soon as possible: Allergic reactions--skin rash, itching, hives, swelling of the face, lips, tongue, or throat Dry cough, shortness of breath or trouble breathing Increased saliva or tears, increased sweating, stomach cramping, diarrhea, small pupils, unusual weakness or fatigue, slow heartbeat Infection--fever, chills, cough, sore throat, wounds that don't heal, pain or trouble when passing urine, general feeling of discomfort or being unwell Kidney injury--decrease in the amount of urine, swelling of the ankles, hands, or feet Low red blood cell level--unusual weakness or fatigue, dizziness, headache, trouble breathing Severe or prolonged diarrhea Unusual bruising or bleeding Side effects that usually do not require medical attention (report to your care team if they continue or are bothersome): Constipation Diarrhea Hair loss Loss of appetite Nausea Stomach pain This list may not describe all possible side effects. Call your doctor for medical advice about side effects. You may  report side effects to FDA at 1-800-FDA-1088. Where should I keep my medication? This medication is given in a hospital or clinic. It will not be stored at home. NOTE: This sheet is a summary. It may not cover all possible information. If you have questions about this medicine, talk to your doctor, pharmacist, or health care provider.  2024 Elsevier/Gold Standard (2021-12-26 00:00:00)  Leucovorin Injection What is this medication? LEUCOVORIN (loo koe VOR in) prevents side effects from certain medications, such as methotrexate. It works by increasing folate levels. This helps protect healthy cells in your body. It may also be used to treat anemia caused by low levels of folate. It can also be used with fluorouracil, a type of chemotherapy, to treat colorectal cancer. It works by increasing the effects of fluorouracil in the body. This medicine  may be used for other purposes; ask your health care provider or pharmacist if you have questions. What should I tell my care team before I take this medication? They need to know if you have any of these conditions: Anemia from low levels of vitamin B12 in the blood An unusual or allergic reaction to leucovorin, folic acid, other medications, foods, dyes, or preservatives Pregnant or trying to get pregnant Breastfeeding How should I use this medication? This medication is injected into a vein or a muscle. It is given by your care team in a hospital or clinic setting. Talk to your care team about the use of this medication in children. Special care may be needed. Overdosage: If you think you have taken too much of this medicine contact a poison control center or emergency room at once. NOTE: This medicine is only for you. Do not share this medicine with others. What if I miss a dose? Keep appointments for follow-up doses. It is important not to miss your dose. Call your care team if you are unable to keep an appointment. What may interact with this medication? Capecitabine Fluorouracil Phenobarbital Phenytoin Primidone Trimethoprim;sulfamethoxazole This list may not describe all possible interactions. Give your health care provider a list of all the medicines, herbs, non-prescription drugs, or dietary supplements you use. Also tell them if you smoke, drink alcohol, or use illegal drugs. Some items may interact with your medicine. What should I watch for while using this medication? Your condition will be monitored carefully while you are receiving this medication. This medication may increase the side effects of 5-fluorouracil. Tell your care team if you have diarrhea or mouth sores that do not get better or that get worse. What side effects may I notice from receiving this medication? Side effects that you should report to your care team as soon as possible: Allergic reactions--skin rash,  itching, hives, swelling of the face, lips, tongue, or throat This list may not describe all possible side effects. Call your doctor for medical advice about side effects. You may report side effects to FDA at 1-800-FDA-1088. Where should I keep my medication? This medication is given in a hospital or clinic. It will not be stored at home. NOTE: This sheet is a summary. It may not cover all possible information. If you have questions about this medicine, talk to your doctor, pharmacist, or health care provider.  2024 Elsevier/Gold Standard (2022-01-17 00:00:00)  Fluorouracil Injection What is this medication? FLUOROURACIL (flure oh YOOR a sil) treats some types of cancer. It works by slowing down the growth of cancer cells. This medicine may be used for other purposes; ask your health care provider or  pharmacist if you have questions. COMMON BRAND NAME(S): Adrucil What should I tell my care team before I take this medication? They need to know if you have any of these conditions: Blood disorders Dihydropyrimidine dehydrogenase (DPD) deficiency Infection, such as chickenpox, cold sores, herpes Kidney disease Liver disease Poor nutrition Recent or ongoing radiation therapy An unusual or allergic reaction to fluorouracil, other medications, foods, dyes, or preservatives If you or your partner are pregnant or trying to get pregnant Breast-feeding How should I use this medication? This medication is injected into a vein. It is administered by your care team in a hospital or clinic setting. Talk to your care team about the use of this medication in children. Special care may be needed. Overdosage: If you think you have taken too much of this medicine contact a poison control center or emergency room at once. NOTE: This medicine is only for you. Do not share this medicine with others. What if I miss a dose? Keep appointments for follow-up doses. It is important not to miss your dose. Call  your care team if you are unable to keep an appointment. What may interact with this medication? Do not take this medication with any of the following: Live virus vaccines This medication may also interact with the following: Medications that treat or prevent blood clots, such as warfarin, enoxaparin, dalteparin This list may not describe all possible interactions. Give your health care provider a list of all the medicines, herbs, non-prescription drugs, or dietary supplements you use. Also tell them if you smoke, drink alcohol, or use illegal drugs. Some items may interact with your medicine. What should I watch for while using this medication? Your condition will be monitored carefully while you are receiving this medication. This medication may make you feel generally unwell. This is not uncommon as chemotherapy can affect healthy cells as well as cancer cells. Report any side effects. Continue your course of treatment even though you feel ill unless your care team tells you to stop. In some cases, you may be given additional medications to help with side effects. Follow all directions for their use. This medication may increase your risk of getting an infection. Call your care team for advice if you get a fever, chills, sore throat, or other symptoms of a cold or flu. Do not treat yourself. Try to avoid being around people who are sick. This medication may increase your risk to bruise or bleed. Call your care team if you notice any unusual bleeding. Be careful brushing or flossing your teeth or using a toothpick because you may get an infection or bleed more easily. If you have any dental work done, tell your dentist you are receiving this medication. Avoid taking medications that contain aspirin, acetaminophen, ibuprofen, naproxen, or ketoprofen unless instructed by your care team. These medications may hide a fever. Do not treat diarrhea with over the counter products. Contact your care team if  you have diarrhea that lasts more than 2 days or if it is severe and watery. This medication can make you more sensitive to the sun. Keep out of the sun. If you cannot avoid being in the sun, wear protective clothing and sunscreen. Do not use sun lamps, tanning beds, or tanning booths. Talk to your care team if you or your partner wish to become pregnant or think you might be pregnant. This medication can cause serious birth defects if taken during pregnancy and for 3 months after the last dose. A reliable form  of contraception is recommended while taking this medication and for 3 months after the last dose. Talk to your care team about effective forms of contraception. Do not father a child while taking this medication and for 3 months after the last dose. Use a condom while having sex during this time period. Do not breastfeed while taking this medication. This medication may cause infertility. Talk to your care team if you are concerned about your fertility. What side effects may I notice from receiving this medication? Side effects that you should report to your care team as soon as possible: Allergic reactions--skin rash, itching, hives, swelling of the face, lips, tongue, or throat Heart attack--pain or tightness in the chest, shoulders, arms, or jaw, nausea, shortness of breath, cold or clammy skin, feeling faint or lightheaded Heart failure--shortness of breath, swelling of the ankles, feet, or hands, sudden weight gain, unusual weakness or fatigue Heart rhythm changes--fast or irregular heartbeat, dizziness, feeling faint or lightheaded, chest pain, trouble breathing High ammonia level--unusual weakness or fatigue, confusion, loss of appetite, nausea, vomiting, seizures Infection--fever, chills, cough, sore throat, wounds that don't heal, pain or trouble when passing urine, general feeling of discomfort or being unwell Low red blood cell level--unusual weakness or fatigue, dizziness, headache,  trouble breathing Pain, tingling, or numbness in the hands or feet, muscle weakness, change in vision, confusion or trouble speaking, loss of balance or coordination, trouble walking, seizures Redness, swelling, and blistering of the skin over hands and feet Severe or prolonged diarrhea Unusual bruising or bleeding Side effects that usually do not require medical attention (report to your care team if they continue or are bothersome): Dry skin Headache Increased tears Nausea Pain, redness, or swelling with sores inside the mouth or throat Sensitivity to light Vomiting This list may not describe all possible side effects. Call your doctor for medical advice about side effects. You may report side effects to FDA at 1-800-FDA-1088. Where should I keep my medication? This medication is given in a hospital or clinic. It will not be stored at home. NOTE: This sheet is a summary. It may not cover all possible information. If you have questions about this medicine, talk to your doctor, pharmacist, or health care provider.  2024 Elsevier/Gold Standard (2021-12-20 00:00:00)  The chemotherapy medication bag should finish at 46 hours, 96 hours, or 7 days. For example, if your pump is scheduled for 46 hours and it was put on at 4:00 p.m., it should finish at 2:00 p.m. the day it is scheduled to come off regardless of your appointment time.     Estimated time to finish at 11:30 a.m. on Wednesday 10/24/2023.   If the display on your pump reads "Low Volume" and it is beeping, take the batteries out of the pump and come to the cancer center for it to be taken off.   If the pump alarms go off prior to the pump reading "Low Volume" then call [phone] and someone can assist you.  If the plunger comes out and the chemotherapy medication is leaking out, please use your home chemo spill kit to clean up the spill. Do NOT use paper towels or other household products.  If you have problems or questions  regarding your pump, please call either [phone] (24 hours a day) or the cancer center Monday-Friday 8:00 a.m.- 4:30 p.m. at the clinic number and we will assist you. If you are unable to get assistance, then go to the nearest Emergency Department and ask the staff  to contact the IV team for assistance.

## 2023-10-24 ENCOUNTER — Inpatient Hospital Stay: Payer: 59

## 2023-10-24 VITALS — BP 116/60 | HR 73 | Temp 98.3°F | Resp 18

## 2023-10-24 DIAGNOSIS — C189 Malignant neoplasm of colon, unspecified: Secondary | ICD-10-CM

## 2023-10-24 DIAGNOSIS — Z79899 Other long term (current) drug therapy: Secondary | ICD-10-CM | POA: Diagnosis not present

## 2023-10-24 DIAGNOSIS — C182 Malignant neoplasm of ascending colon: Secondary | ICD-10-CM | POA: Diagnosis not present

## 2023-10-24 DIAGNOSIS — Z5111 Encounter for antineoplastic chemotherapy: Secondary | ICD-10-CM | POA: Diagnosis not present

## 2023-10-24 DIAGNOSIS — D509 Iron deficiency anemia, unspecified: Secondary | ICD-10-CM | POA: Diagnosis not present

## 2023-10-24 MED ORDER — SODIUM CHLORIDE 0.9% FLUSH
10.0000 mL | INTRAVENOUS | Status: DC | PRN
Start: 2023-10-24 — End: 2023-10-24
  Administered 2023-10-24: 10 mL

## 2023-10-24 MED ORDER — HEPARIN SOD (PORK) LOCK FLUSH 100 UNIT/ML IV SOLN
500.0000 [IU] | Freq: Once | INTRAVENOUS | Status: AC | PRN
  Administered 2023-10-24: 500 [IU]

## 2023-10-24 MED ORDER — PEGFILGRASTIM-CBQV 6 MG/0.6ML ~~LOC~~ SOSY
6.0000 mg | PREFILLED_SYRINGE | Freq: Once | SUBCUTANEOUS | Status: AC
  Administered 2023-10-24: 6 mg via SUBCUTANEOUS
  Filled 2023-10-24: qty 0.6

## 2023-10-24 NOTE — Patient Instructions (Signed)
 Pegfilgrastim Injection What is this medication? PEGFILGRASTIM (PEG fil gra stim) lowers the risk of infection in people who are receiving chemotherapy. It works by Systems analyst make more white blood cells, which protects your body from infection. It may also be used to help people who have been exposed to high doses of radiation. This medicine may be used for other purposes; ask your health care provider or pharmacist if you have questions. COMMON BRAND NAME(S): Cherly Hensen, Neulasta, Nyvepria, Stimufend, UDENYCA, UDENYCA ONBODY, Ziextenzo What should I tell my care team before I take this medication? They need to know if you have any of these conditions: Kidney disease Latex allergy Ongoing radiation therapy Sickle cell disease Skin reactions to acrylic adhesives (On-Body Injector only) An unusual or allergic reaction to pegfilgrastim, filgrastim, other medications, foods, dyes, or preservatives Pregnant or trying to get pregnant Breast-feeding How should I use this medication? This medication is for injection under the skin. If you get this medication at home, you will be taught how to prepare and give the pre-filled syringe or how to use the On-body Injector. Refer to the patient Instructions for Use for detailed instructions. Use exactly as directed. Tell your care team immediately if you suspect that the On-body Injector may not have performed as intended or if you suspect the use of the On-body Injector resulted in a missed or partial dose. It is important that you put your used needles and syringes in a special sharps container. Do not put them in a trash can. If you do not have a sharps container, call your pharmacist or care team to get one. Talk to your care team about the use of this medication in children. While this medication may be prescribed for selected conditions, precautions do apply. Overdosage: If you think you have taken too much of this medicine contact a poison  control center or emergency room at once. NOTE: This medicine is only for you. Do not share this medicine with others. What if I miss a dose? It is important not to miss your dose. Call your care team if you miss your dose. If you miss a dose due to an On-body Injector failure or leakage, a new dose should be administered as soon as possible using a single prefilled syringe for manual use. What may interact with this medication? Interactions have not been studied. This list may not describe all possible interactions. Give your health care provider a list of all the medicines, herbs, non-prescription drugs, or dietary supplements you use. Also tell them if you smoke, drink alcohol, or use illegal drugs. Some items may interact with your medicine. What should I watch for while using this medication? Your condition will be monitored carefully while you are receiving this medication. You may need blood work done while you are taking this medication. Talk to your care team about your risk of cancer. You may be more at risk for certain types of cancer if you take this medication. If you are going to need a MRI, CT scan, or other procedure, tell your care team that you are using this medication (On-Body Injector only). What side effects may I notice from receiving this medication? Side effects that you should report to your care team as soon as possible: Allergic reactions--skin rash, itching, hives, swelling of the face, lips, tongue, or throat Capillary leak syndrome--stomach or muscle pain, unusual weakness or fatigue, feeling faint or lightheaded, decrease in the amount of urine, swelling of the ankles, hands, or  feet, trouble breathing High white blood cell level--fever, fatigue, trouble breathing, night sweats, change in vision, weight loss Inflammation of the aorta--fever, fatigue, back, chest, or stomach pain, severe headache Kidney injury (glomerulonephritis)--decrease in the amount of urine, red  or dark brown urine, foamy or bubbly urine, swelling of the ankles, hands, or feet Shortness of breath or trouble breathing Spleen injury--pain in upper left stomach or shoulder Unusual bruising or bleeding Side effects that usually do not require medical attention (report to your care team if they continue or are bothersome): Bone pain Pain in the hands or feet This list may not describe all possible side effects. Call your doctor for medical advice about side effects. You may report side effects to FDA at 1-800-FDA-1088. Where should I keep my medication? Keep out of the reach of children. If you are using this medication at home, you will be instructed on how to store it. Throw away any unused medication after the expiration date on the label. NOTE: This sheet is a summary. It may not cover all possible information. If you have questions about this medicine, talk to your doctor, pharmacist, or health care provider.  2024 Elsevier/Gold Standard (2021-07-15 00:00:00)  Implanted [facility] Guide An implanted port is a device that is placed under the skin. It is usually placed in the chest. The device may vary based on the need. Implanted ports can be used to give IV medicine, to take blood, or to give fluids. You may have an implanted port if: You need IV medicine that would be irritating to the small veins in your hands or arms. You need IV medicines, such as chemotherapy, for a long period of time. You need IV nutrition for a long period of time. You may have fewer limitations when using a port than you would if you used other types of long-term IVs. You will also likely be able to return to normal activities after your incision heals. An implanted port has two main parts: Reservoir. The reservoir is the part where a needle is inserted to give medicines or draw blood. The reservoir is round. After the port is placed, it appears as a small, raised area under your skin. Catheter. The catheter  is a small, thin tube that connects the reservoir to a vein. Medicine that is inserted into the reservoir goes into the catheter and then into the vein. How is my port accessed? To access your port: A numbing cream may be placed on the skin over the port site. Your health care provider will put on a mask and sterile gloves. The skin over your port will be cleaned carefully with a germ-killing soap and allowed to dry. Your health care provider will gently pinch the port and insert a needle into it. Your health care provider will check for a blood return to make sure the port is in the vein and is still working (patent). If your port needs to remain accessed to get medicine continuously (constant infusion), your health care provider will place a clear bandage (dressing) over the needle site. The dressing and needle will need to be changed every week, or as told by your health care provider. What is flushing? Flushing helps keep the port working. Follow instructions from your health care provider about how and when to flush the port. Ports are usually flushed with saline solution or a medicine called heparin. The need for flushing will depend on how the port is used: If the port is only used  from time to time to give medicines or draw blood, the port may need to be flushed: Before and after medicines have been given. Before and after blood has been drawn. As part of routine maintenance. Flushing may be recommended every 4-6 weeks. If a constant infusion is running, the port may not need to be flushed. Throw away any syringes in a disposal container that is meant for sharp items (sharps container). You can buy a sharps container from a pharmacy, or you can make one by using an empty hard plastic bottle with a cover. How long will my port stay implanted? The port can stay in for as long as your health care provider thinks it is needed. When it is time for the port to come out, a surgery will be done to  remove it. The surgery will be similar to the procedure that was done to put the port in. Follow these instructions at home: Caring for your port and port site Flush your port as told by your health care provider. If you need an infusion over several days, follow instructions from your health care provider about how to take care of your port site. Make sure you: Change your dressing as told by your health care provider. Wash your hands with soap and water for at least 20 seconds before and after you change your dressing. If soap and water are not available, use alcohol-based hand sanitizer. Place any used dressings or infusion bags into a plastic bag. Throw that bag in the trash. Keep the dressing that covers the needle clean and dry. Do not get it wet. Do not use scissors or sharp objects near the infusion tubing. Keep any external tubes clamped, unless they are being used. Check your port site every day for signs of infection. Check for: Redness, swelling, or pain. Fluid or blood. Warmth. Pus or a bad smell. Protect the skin around the port site. Avoid wearing bra straps that rub or irritate the site. Protect the skin around your port from seat belts. Place a soft pad over your chest if needed. Bathe or shower as told by your health care provider. The site may get wet as long as you are not actively receiving an infusion. General instructions  Return to your normal activities as told by your health care provider. Ask your health care provider what activities are safe for you. Carry a medical alert card or wear a medical alert bracelet at all times. This will let health care providers know that you have an implanted port in case of an emergency. Where to find more information American Cancer Society: www.cancer.org American Society of Clinical Oncology: www.cancer.net Contact a health care provider if: You have a fever or chills. You have redness, swelling, or pain at the port  site. You have fluid or blood coming from your port site. Your incision feels warm to the touch. You have pus or a bad smell coming from the port site. Summary Implanted ports are usually placed in the chest for long-term IV access. Follow instructions from your health care provider about flushing the port and changing bandages (dressings). Take care of the area around your port by avoiding clothing that puts pressure on the area, and by watching for signs of infection. Protect the skin around your port from seat belts. Place a soft pad over your chest if needed. Contact a health care provider if you have a fever or you have redness, swelling, pain, fluid, or a bad smell at  the port site. This information is not intended to replace advice given to you by your health care provider. Make sure you discuss any questions you have with your health care provider. Document Revised: 02/15/2021 Document Reviewed: 02/15/2021 Elsevier Patient Education  2024 [employer].

## 2023-10-25 ENCOUNTER — Encounter: Payer: Self-pay | Admitting: Oncology

## 2023-10-25 ENCOUNTER — Other Ambulatory Visit: Payer: Self-pay | Admitting: *Deleted

## 2023-10-25 MED ORDER — FLUCONAZOLE 100 MG PO TABS: 100.0000 mg | ORAL_TABLET | Freq: Every day | ORAL | 0 refills | Status: DC

## 2023-10-25 NOTE — Progress Notes (Signed)
 Per MyChart has thrush. Fluconazole x 4 days sent to pharmacy. Reminded patient that he will have this less often if he gets his glucose under control.

## 2023-10-26 ENCOUNTER — Encounter: Payer: Self-pay | Admitting: Oncology

## 2023-10-26 DIAGNOSIS — C182 Malignant neoplasm of ascending colon: Secondary | ICD-10-CM | POA: Diagnosis not present

## 2023-10-29 ENCOUNTER — Encounter: Payer: Self-pay | Admitting: Oncology

## 2023-10-29 ENCOUNTER — Other Ambulatory Visit: Payer: Self-pay | Admitting: Oncology

## 2023-10-29 ENCOUNTER — Other Ambulatory Visit: Payer: 59

## 2023-10-29 ENCOUNTER — Other Ambulatory Visit: Payer: Self-pay

## 2023-10-29 ENCOUNTER — Ambulatory Visit: Payer: 59 | Admitting: Nurse Practitioner

## 2023-10-29 ENCOUNTER — Ambulatory Visit: Payer: 59

## 2023-10-29 DIAGNOSIS — D649 Anemia, unspecified: Secondary | ICD-10-CM

## 2023-10-30 ENCOUNTER — Encounter: Payer: Self-pay | Admitting: Oncology

## 2023-10-30 ENCOUNTER — Other Ambulatory Visit: Payer: Self-pay

## 2023-10-30 ENCOUNTER — Inpatient Hospital Stay: Attending: Oncology

## 2023-10-30 DIAGNOSIS — F32A Depression, unspecified: Secondary | ICD-10-CM | POA: Insufficient documentation

## 2023-10-30 DIAGNOSIS — E785 Hyperlipidemia, unspecified: Secondary | ICD-10-CM | POA: Diagnosis not present

## 2023-10-30 DIAGNOSIS — Z79899 Other long term (current) drug therapy: Secondary | ICD-10-CM | POA: Insufficient documentation

## 2023-10-30 DIAGNOSIS — F410 Panic disorder [episodic paroxysmal anxiety] without agoraphobia: Secondary | ICD-10-CM | POA: Insufficient documentation

## 2023-10-30 DIAGNOSIS — I10 Essential (primary) hypertension: Secondary | ICD-10-CM | POA: Diagnosis not present

## 2023-10-30 DIAGNOSIS — Z5111 Encounter for antineoplastic chemotherapy: Secondary | ICD-10-CM | POA: Insufficient documentation

## 2023-10-30 DIAGNOSIS — C182 Malignant neoplasm of ascending colon: Secondary | ICD-10-CM | POA: Diagnosis not present

## 2023-10-30 DIAGNOSIS — D649 Anemia, unspecified: Secondary | ICD-10-CM

## 2023-10-30 DIAGNOSIS — D509 Iron deficiency anemia, unspecified: Secondary | ICD-10-CM | POA: Insufficient documentation

## 2023-10-30 DIAGNOSIS — K219 Gastro-esophageal reflux disease without esophagitis: Secondary | ICD-10-CM | POA: Insufficient documentation

## 2023-10-30 LAB — CBC WITH DIFFERENTIAL (CANCER CENTER ONLY)
Abs Immature Granulocytes: 0.64 10*3/uL — ABNORMAL HIGH (ref 0.00–0.07)
Basophils Absolute: 0.1 10*3/uL (ref 0.0–0.1)
Basophils Relative: 1 %
Eosinophils Absolute: 0.1 10*3/uL (ref 0.0–0.5)
Eosinophils Relative: 1 %
HCT: 31.5 % — ABNORMAL LOW (ref 39.0–52.0)
Hemoglobin: 9.6 g/dL — ABNORMAL LOW (ref 13.0–17.0)
Immature Granulocytes: 5 %
Lymphocytes Relative: 9 %
Lymphs Abs: 1.2 10*3/uL (ref 0.7–4.0)
MCH: 24.7 pg — ABNORMAL LOW (ref 26.0–34.0)
MCHC: 30.5 g/dL (ref 30.0–36.0)
MCV: 81 fL (ref 80.0–100.0)
Monocytes Absolute: 0.7 10*3/uL (ref 0.1–1.0)
Monocytes Relative: 6 %
Neutro Abs: 10.3 10*3/uL — ABNORMAL HIGH (ref 1.7–7.7)
Neutrophils Relative %: 78 %
Platelet Count: 163 10*3/uL (ref 150–400)
RBC: 3.89 MIL/uL — ABNORMAL LOW (ref 4.22–5.81)
RDW: 22.6 % — ABNORMAL HIGH (ref 11.5–15.5)
WBC Count: 13 10*3/uL — ABNORMAL HIGH (ref 4.0–10.5)
nRBC: 0.4 % — ABNORMAL HIGH (ref 0.0–0.2)

## 2023-10-31 ENCOUNTER — Encounter: Payer: Self-pay | Admitting: Oncology

## 2023-10-31 ENCOUNTER — Other Ambulatory Visit

## 2023-11-04 ENCOUNTER — Telehealth: Admitting: Family

## 2023-11-04 DIAGNOSIS — K0889 Other specified disorders of teeth and supporting structures: Secondary | ICD-10-CM | POA: Diagnosis not present

## 2023-11-04 MED ORDER — AMOXICILLIN-POT CLAVULANATE 875-125 MG PO TABS: 1.0000 | ORAL_TABLET | Freq: Two times a day (BID) | ORAL | 0 refills | Status: DC

## 2023-11-04 MED ORDER — IBUPROFEN 600 MG PO TABS: 600.0000 mg | ORAL_TABLET | Freq: Three times a day (TID) | ORAL | 0 refills | Status: DC | PRN

## 2023-11-04 NOTE — Addendum Note (Signed)
 Addended by: Jannifer Rodney A on: 11/04/2023 07:41 AM   Modules accepted: Orders

## 2023-11-04 NOTE — Progress Notes (Signed)

## 2023-11-05 ENCOUNTER — Encounter: Payer: Self-pay | Admitting: Oncology

## 2023-11-05 ENCOUNTER — Inpatient Hospital Stay: Payer: 59

## 2023-11-05 ENCOUNTER — Encounter: Payer: Self-pay | Admitting: Nurse Practitioner

## 2023-11-05 ENCOUNTER — Inpatient Hospital Stay (HOSPITAL_BASED_OUTPATIENT_CLINIC_OR_DEPARTMENT_OTHER): Payer: 59 | Admitting: Nurse Practitioner

## 2023-11-05 ENCOUNTER — Other Ambulatory Visit (HOSPITAL_BASED_OUTPATIENT_CLINIC_OR_DEPARTMENT_OTHER): Payer: Self-pay

## 2023-11-05 VITALS — BP 112/79 | HR 83 | Temp 97.6°F | Resp 19 | Wt 242.8 lb

## 2023-11-05 DIAGNOSIS — C189 Malignant neoplasm of colon, unspecified: Secondary | ICD-10-CM

## 2023-11-05 DIAGNOSIS — Z79899 Other long term (current) drug therapy: Secondary | ICD-10-CM | POA: Diagnosis not present

## 2023-11-05 DIAGNOSIS — E785 Hyperlipidemia, unspecified: Secondary | ICD-10-CM | POA: Diagnosis not present

## 2023-11-05 DIAGNOSIS — C787 Secondary malignant neoplasm of liver and intrahepatic bile duct: Secondary | ICD-10-CM | POA: Diagnosis not present

## 2023-11-05 DIAGNOSIS — C182 Malignant neoplasm of ascending colon: Secondary | ICD-10-CM | POA: Diagnosis not present

## 2023-11-05 DIAGNOSIS — I10 Essential (primary) hypertension: Secondary | ICD-10-CM | POA: Diagnosis not present

## 2023-11-05 DIAGNOSIS — D509 Iron deficiency anemia, unspecified: Secondary | ICD-10-CM | POA: Diagnosis not present

## 2023-11-05 DIAGNOSIS — Z5111 Encounter for antineoplastic chemotherapy: Secondary | ICD-10-CM | POA: Diagnosis not present

## 2023-11-05 DIAGNOSIS — K219 Gastro-esophageal reflux disease without esophagitis: Secondary | ICD-10-CM | POA: Diagnosis not present

## 2023-11-05 LAB — CBC WITH DIFFERENTIAL (CANCER CENTER ONLY)
Abs Immature Granulocytes: 0.07 10*3/uL (ref 0.00–0.07)
Basophils Absolute: 0.1 10*3/uL (ref 0.0–0.1)
Basophils Relative: 1 %
Eosinophils Absolute: 0.2 10*3/uL (ref 0.0–0.5)
Eosinophils Relative: 3 %
HCT: 36.3 % — ABNORMAL LOW (ref 39.0–52.0)
Hemoglobin: 10.8 g/dL — ABNORMAL LOW (ref 13.0–17.0)
Immature Granulocytes: 1 %
Lymphocytes Relative: 15 %
Lymphs Abs: 0.9 10*3/uL (ref 0.7–4.0)
MCH: 23.8 pg — ABNORMAL LOW (ref 26.0–34.0)
MCHC: 29.8 g/dL — ABNORMAL LOW (ref 30.0–36.0)
MCV: 80.1 fL (ref 80.0–100.0)
Monocytes Absolute: 0.3 10*3/uL (ref 0.1–1.0)
Monocytes Relative: 6 %
Neutro Abs: 4.5 10*3/uL (ref 1.7–7.7)
Neutrophils Relative %: 74 %
Platelet Count: 135 10*3/uL — ABNORMAL LOW (ref 150–400)
RBC: 4.53 MIL/uL (ref 4.22–5.81)
RDW: 21.9 % — ABNORMAL HIGH (ref 11.5–15.5)
WBC Count: 6 10*3/uL (ref 4.0–10.5)
nRBC: 0 % (ref 0.0–0.2)

## 2023-11-05 LAB — CMP (CANCER CENTER ONLY)
ALT: 30 U/L (ref 0–44)
AST: 26 U/L (ref 15–41)
Albumin: 4.3 g/dL (ref 3.5–5.0)
Alkaline Phosphatase: 194 U/L — ABNORMAL HIGH (ref 38–126)
Anion gap: 8 (ref 5–15)
BUN: 8 mg/dL (ref 6–20)
CO2: 27 mmol/L (ref 22–32)
Calcium: 9 mg/dL (ref 8.9–10.3)
Chloride: 103 mmol/L (ref 98–111)
Creatinine: 0.74 mg/dL (ref 0.61–1.24)
GFR, Estimated: >60 mL/min (ref >60)
Glucose, Bld: 200 mg/dL — ABNORMAL HIGH (ref 70–99)
Potassium: 3.7 mmol/L (ref 3.5–5.1)
Sodium: 138 mmol/L (ref 135–145)
Total Bilirubin: 0.8 mg/dL (ref 0.0–1.2)
Total Protein: 7.4 g/dL (ref 6.5–8.1)

## 2023-11-05 LAB — CEA (ACCESS): CEA (CHCC): 12.13 ng/mL — ABNORMAL HIGH (ref 0.00–5.00)

## 2023-11-05 LAB — TOTAL PROTEIN, URINE DIPSTICK: Protein, ur: NEGATIVE mg/dL

## 2023-11-05 MED ORDER — NYSTATIN 100000 UNIT/ML MT SUSP
5.0000 mL | Freq: Four times a day (QID) | ORAL | 1 refills | Status: DC
Start: 2023-11-05 — End: 2024-01-22
  Filled 2023-11-05: qty 240, 12d supply, fill #0
  Filled 2023-11-28: qty 240, 12d supply, fill #1

## 2023-11-05 MED ORDER — OXYCODONE-ACETAMINOPHEN 5-325 MG PO TABS
1.0000 | ORAL_TABLET | Freq: Four times a day (QID) | ORAL | 0 refills | Status: DC | PRN
Start: 2023-11-05 — End: 2024-01-22

## 2023-11-05 NOTE — Progress Notes (Signed)
 [facility] OFFICE PROGRESS NOTE   Diagnosis: Colon cancer  INTERVAL HISTORY:   Anthony Calhoun returns as scheduled.  He completed another cycle of FOLFIRI/bevacizumab 10/22/2023.  He again had nausea.  He continues to have rectal bleeding.  He reports this has been attributed to hemorrhoids.  Over the weekend he began experiencing severe pain involving a tooth/gum at the left posterior lower gumline.  He contacted a dentist over the weekend and was prescribed penicillin.  He has been taking Vicodin without relief.    Objective:  Vital signs in last 24 hours:  Blood pressure 112/79, pulse 83, temperature 97.6 F (36.4 C), temperature source Temporal, resp. rate 19, weight 242 lb 12.8 oz (110.1 kg), SpO2 99%.    HEENT: No thrush or ulcers.  Tender along the left lower outer gumline. Resp: Lungs clear bilaterally. Cardio: Regular rate and rhythm. GI: Abdomen soft and nontender.  No hepatomegaly. Vascular: No leg edema. Port-A-Cath without erythema.  Lab Results:  Lab Results  Component Value Date   WBC 6.0 11/05/2023   HGB 10.8 (L) 11/05/2023   HCT 36.3 (L) 11/05/2023   MCV 80.1 11/05/2023   PLT 135 (L) 11/05/2023   NEUTROABS 4.5 11/05/2023    Imaging:  No results found.  Medications: I have reviewed the patient's current medications.  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 secondary to nausea (patient discontinued Xeloda on day 11 due to colonoscopy) Colonoscopy  07/09/2020-nodular mucosa at the colonic anastomosis, biopsied; patent end-to-side ileocolonic anastomosis characterized by healthy-appearing mucosa and visible sutures; nonbleeding internal hemorrhoids, biopsy with benign ulcerated anastomotic mucosa with granulation tissue Cycle 4 adjuvant Xeloda 07/19/2020, 2000 mg every morning and 1500 mg every afternoon Cycle 5 adjuvant Xeloda 08/09/2020 Cycle 6 adjuvant Xeloda 08/31/2019 Cycle 7 adjuvant Xeloda 09/20/2020 Cycle 8 adjuvant Xeloda 10/11/2020 CTs 03/07/2021-no evidence of recurrent disease, hepatic steatosis, slight wall thickening at the junction of the descending and horizontal duodenum Mild elevation of CEA 2023 12/01/2021-Guardant Reveal-ct DNA detected PET 12/30/2021-hypermetabolic right liver lesion, hypermetabolic area in a loop of mid small bowel with an SUV of 12.5, review of PET images at GI tumor conference 01/11/2022-small bowel uptake felt to be a benign finding MRI liver 01/10/2022-solitary 1.2 cm right liver lesion between segments 7 and 8 compatible with metastasis, subtle changes suggesting cirrhosis, hepatic steatosis CT enteroscopy 02/02/2022-small bowel loop with hypermetabolic activity on PET continues to have a bandlike density along the margin felt to represent a diverticulum or adjacent nodal tissue.  Small bowel tumor not excluded.  3-4 mm left omental nodule 02/08/2022-biopsy and ablation of solitary liver lesion, adenocarcinoma consistent with metastatic colorectal adenocarcinoma CTs 03/31/2022-unchanged circumstantial soft tissue thickening surrounding a loop of mid to distal small bowel with an adjacent nodular focus measuring 1.2 cm.,  Ablation cavity in segment 7, no other evidence of metastatic disease Diagnostic laparoscopy 05/26/2022-mid small bowel mass-resected, well to moderately differentiated adenocarcinoma, 0/2 lymph nodes, morphology consistent with a colon primary, tumor involved full-thickness including serosa with  perineural and large vessel involvement; foundation 1-microsatellite stable, tumor mutation burden 2, K-ras Q61H CT abdomen/pelvis 07/10/2022-the  right liver ablation defect is smaller, new subtle right liver lesion measuring 13 x 9 mm, enlarged lymph node in the right upper quadrant mesentery adjacent to surgical anastomosis PET 07/26/2022-new segment 6 liver lesion, enlarging hypermetabolic left omental lesion, there is another mildly hypermetabolic peritoneal implant, 7 mm omental nodule at the midline without hypermetabolism Cycle 1 FOLFOX 09/25/2022 Cycle 2 FOLFOX/bevacizumab 10/09/2022, 5-FU dose reduced secondary to mucositis Cycle 3 FOLFOX/bevacizumab 10/23/2022 Cycle 4 FOLFOX/bevacizumab 11/06/2022 Cycle 5 FOLFOX/bevacizumab 11/20/2022 Cycle 6 FOLFOX/bevacizumab 12/04/2022 CT abdomen/pelvis 12/14/2022-further contraction of the treated segment 7 lesion, slight enlargement of segment 6 lesion, no new liver lesion, increased size of ileocolic nodes and an omental lesion, stable small retroperitoneal nodes Cycle 7 FOLFOX/bevacizumab 12/18/2022 Cycle 8 FOLFOX/bevacizumab 01/02/2023, oxaliplatin dose reduced secondary to prolonged nausea and neuropathy symptoms, Emend added Cycle 9 FOLFOX/bevacizumab 01/16/2023, oxaliplatin discontinued secondary to neuropathy Cycle 1 FOLFIRI 02/19/2023, Avastin held due to recent dental extractions Cycle 2 FOLFIRI/Avastin 03/05/2023 Cycle 3 FOLFIRI/Avastin 03/19/2023, 5-FU bolus held due to mucositis after cycle 2, Fulphila added Cycle 4 FOLFIRI/Avastin 04/02/2023, Fulphila, Emend Cycle 5 FOLFIRI/Avastin 04/16/2023, Fulphila, Emend CTs 05/02/2023-similar ileocolic mesenteric adenopathy and omental metastasis, cirrhosis with portal hypertension, stable liver lesions, no new or progressive disease Cycle 6 FOLFIRI/Avastin 05/07/2023, Fulphila, Emend Cycle 7 FOLFIRI/Avastin 05/21/2023, Fulphila, Emend Cycle 8 FOLFIRI 06/04/2023, Fulphila, Emend; Avastin held Cycle 9 FOLFIRI  06/18/2023, Anthony Calhoun, Emend, Avastin held Treatment held 07/09/2023 patient request Cycle 10 FOLFIRI 07/18/2023, Fulphila, Emend, Avastin held CTs 07/31/2023: Stable with unchanged hepatic lesions, mesenteric lymph nodes, and left omental nodule, unchanged morphologic changes of cirrhosis and portal hypertension Cycle 11 FOLFIRI/Avastin 08/01/2023, Fulphila Chemotherapy held 08/15/2023 per patient request Cycle 12 FOLFIRI/Avastin 08/30/2023, Udenyca Cycle 13 FOLFIRI/Avastin 09/19/2023, Udenyca Cycle 14 FOLFIRI/Avastin 10/01/2023, Udenyca Cycle 15 FOLFIRI/Avastin 10/22/2023, Udenyca 11/05/2023 treatment held per patient request due to dental issue Microcytic anemia secondary to #1-improved Colon polyps-sessile serrated adenoma of the descending colon, tubular adenomas with focal high-grade dysplasia of the transverse colon on colonoscopy 02/12/2020, tubular adenoma of the mid ascending colon on the surgical specimen 04/12/2020 Family history of colon cancer Depression Anxiety/panic attacks Hypertension Hyperlipidemia Nausea secondary to Xeloda?-Resolved Gastroesophageal reflux disease-improved following discontinuation of Xeloda Anemia, microcytic; ferritin 6 01/24/2022; stool positive for blood x3 01/25/2022; 02/02/2022 CT abdomen/pelvis enterography-bowel loop demonstrating hypermetabolic activity continues to have a bandlike density along its margin possibly representing a diverticulum or adjacent nodal tissue, difficult to exclude small bowel tumor, 3 x 4 mm left omental nodule or lymph node, known right hepatic lobe tumor is occult on CT. Venofer weekly x3 beginning 03/03/2022 Negative capsule endoscopy 03/09/2022 Venofer 03/03/2022 for 3 weekly doses Venofer 05/03/2022, 05/19/2022 Venofer 07/28/2022, 08/04/2022 Venofer 03/22/2023, 03/29/2023, 04/04/2023 Venofer 06/18/2023, 07/09/2023 07/03/2023 upper endoscopy and colonoscopy-severe portal hypertensive gastropathy in the gastric fundus, gastric body, stomach, red  blood found greater curvature of the stomach and gastric antrum, treated with APC; colonoscopy showed dark old maroon-colored blood in the entire colon.  No evidence of active or recent bleeding in the colon. 12.  Anterior left neck nodule 03/17/2022-cyst?,  Lymph node? 13.  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  Oxaliplatin neuropathy-mild loss of vibratory sense 12/04/2022, 12/18/2022, 01/02/2023    Disposition: Anthony Calhoun appears stable.  He has completed 15 cycles of FOLFIRI/Avastin.  He began having tooth pain over the  weekend.  He is seeking dental evaluation.  He declines treatment today.  He will try to get in with a dentist in the next few days for evaluation.  He will make sure the dentist is aware of his treatment with chemotherapy and Avastin.  He has been taking Vicodin for the pain.  The pain is not controlled.  Prescription sent to his pharmacy for Percocet 1 tablet every 6 hours as needed, 10 tablets to be dispensed.  He will return for follow-up and treatment as scheduled in 2 weeks.    Lonna Cobb ANP/GNP-BC   11/05/2023  10:44 AM

## 2023-11-06 ENCOUNTER — Other Ambulatory Visit: Payer: Self-pay

## 2023-11-07 ENCOUNTER — Encounter: Payer: Self-pay | Admitting: Oncology

## 2023-11-07 ENCOUNTER — Encounter: Payer: Self-pay | Admitting: Nurse Practitioner

## 2023-11-12 ENCOUNTER — Encounter: Payer: Self-pay | Admitting: Oncology

## 2023-11-13 ENCOUNTER — Encounter: Payer: Self-pay | Admitting: Oncology

## 2023-11-14 ENCOUNTER — Encounter: Payer: Self-pay | Admitting: Oncology

## 2023-11-15 DIAGNOSIS — C189 Malignant neoplasm of colon, unspecified: Secondary | ICD-10-CM | POA: Diagnosis not present

## 2023-11-15 DIAGNOSIS — K760 Fatty (change of) liver, not elsewhere classified: Secondary | ICD-10-CM | POA: Diagnosis not present

## 2023-11-15 DIAGNOSIS — R229 Localized swelling, mass and lump, unspecified: Secondary | ICD-10-CM | POA: Diagnosis not present

## 2023-11-15 DIAGNOSIS — C787 Secondary malignant neoplasm of liver and intrahepatic bile duct: Secondary | ICD-10-CM | POA: Diagnosis not present

## 2023-11-15 DIAGNOSIS — E1165 Type 2 diabetes mellitus with hyperglycemia: Secondary | ICD-10-CM | POA: Diagnosis not present

## 2023-11-15 DIAGNOSIS — D696 Thrombocytopenia, unspecified: Secondary | ICD-10-CM | POA: Diagnosis not present

## 2023-11-15 DIAGNOSIS — I700 Atherosclerosis of aorta: Secondary | ICD-10-CM | POA: Diagnosis not present

## 2023-11-15 DIAGNOSIS — I10 Essential (primary) hypertension: Secondary | ICD-10-CM | POA: Diagnosis not present

## 2023-11-16 ENCOUNTER — Other Ambulatory Visit: Payer: Self-pay

## 2023-11-19 ENCOUNTER — Inpatient Hospital Stay (HOSPITAL_BASED_OUTPATIENT_CLINIC_OR_DEPARTMENT_OTHER): Payer: 59 | Admitting: Oncology

## 2023-11-19 ENCOUNTER — Encounter: Payer: Self-pay | Admitting: Oncology

## 2023-11-19 ENCOUNTER — Inpatient Hospital Stay: Payer: 59

## 2023-11-19 VITALS — BP 123/71 | HR 77 | Temp 98.1°F | Resp 16

## 2023-11-19 VITALS — BP 121/70 | HR 91 | Temp 98.2°F | Resp 18 | Ht 66.0 in | Wt 245.5 lb

## 2023-11-19 DIAGNOSIS — E785 Hyperlipidemia, unspecified: Secondary | ICD-10-CM | POA: Diagnosis not present

## 2023-11-19 DIAGNOSIS — C189 Malignant neoplasm of colon, unspecified: Secondary | ICD-10-CM

## 2023-11-19 DIAGNOSIS — C182 Malignant neoplasm of ascending colon: Secondary | ICD-10-CM | POA: Diagnosis not present

## 2023-11-19 DIAGNOSIS — C787 Secondary malignant neoplasm of liver and intrahepatic bile duct: Secondary | ICD-10-CM

## 2023-11-19 DIAGNOSIS — Z79899 Other long term (current) drug therapy: Secondary | ICD-10-CM | POA: Diagnosis not present

## 2023-11-19 DIAGNOSIS — Z5111 Encounter for antineoplastic chemotherapy: Secondary | ICD-10-CM | POA: Diagnosis not present

## 2023-11-19 DIAGNOSIS — D509 Iron deficiency anemia, unspecified: Secondary | ICD-10-CM | POA: Diagnosis not present

## 2023-11-19 DIAGNOSIS — K219 Gastro-esophageal reflux disease without esophagitis: Secondary | ICD-10-CM | POA: Diagnosis not present

## 2023-11-19 DIAGNOSIS — I10 Essential (primary) hypertension: Secondary | ICD-10-CM | POA: Diagnosis not present

## 2023-11-19 LAB — CBC WITH DIFFERENTIAL (CANCER CENTER ONLY)
Abs Immature Granulocytes: 0.01 10*3/uL (ref 0.00–0.07)
Basophils Absolute: 0.1 10*3/uL (ref 0.0–0.1)
Basophils Relative: 2 %
Eosinophils Absolute: 0.1 10*3/uL (ref 0.0–0.5)
Eosinophils Relative: 2 %
HCT: 33.7 % — ABNORMAL LOW (ref 39.0–52.0)
Hemoglobin: 10 g/dL — ABNORMAL LOW (ref 13.0–17.0)
Immature Granulocytes: 0 %
Lymphocytes Relative: 19 %
Lymphs Abs: 0.9 10*3/uL (ref 0.7–4.0)
MCH: 23.4 pg — ABNORMAL LOW (ref 26.0–34.0)
MCHC: 29.7 g/dL — ABNORMAL LOW (ref 30.0–36.0)
MCV: 78.9 fL — ABNORMAL LOW (ref 80.0–100.0)
Monocytes Absolute: 0.4 10*3/uL (ref 0.1–1.0)
Monocytes Relative: 8 %
Neutro Abs: 3.3 10*3/uL (ref 1.7–7.7)
Neutrophils Relative %: 69 %
Platelet Count: 174 10*3/uL (ref 150–400)
RBC: 4.27 MIL/uL (ref 4.22–5.81)
RDW: 19.1 % — ABNORMAL HIGH (ref 11.5–15.5)
WBC Count: 4.8 10*3/uL (ref 4.0–10.5)
nRBC: 0 % (ref 0.0–0.2)

## 2023-11-19 LAB — CMP (CANCER CENTER ONLY)
ALT: 42 U/L (ref 0–44)
AST: 41 U/L (ref 15–41)
Albumin: 4 g/dL (ref 3.5–5.0)
Alkaline Phosphatase: 154 U/L — ABNORMAL HIGH (ref 38–126)
Anion gap: 8 (ref 5–15)
BUN: 14 mg/dL (ref 6–20)
CO2: 27 mmol/L (ref 22–32)
Calcium: 8.7 mg/dL — ABNORMAL LOW (ref 8.9–10.3)
Chloride: 101 mmol/L (ref 98–111)
Creatinine: 0.9 mg/dL (ref 0.61–1.24)
GFR, Estimated: >60 mL/min (ref >60)
Glucose, Bld: 238 mg/dL — ABNORMAL HIGH (ref 70–99)
Potassium: 3.8 mmol/L (ref 3.5–5.1)
Sodium: 136 mmol/L (ref 135–145)
Total Bilirubin: 0.9 mg/dL (ref 0.0–1.2)
Total Protein: 7 g/dL (ref 6.5–8.1)

## 2023-11-19 LAB — TOTAL PROTEIN, URINE DIPSTICK: Protein, ur: NEGATIVE mg/dL

## 2023-11-19 LAB — CEA (ACCESS): CEA (CHCC): 13.98 ng/mL — ABNORMAL HIGH (ref 0.00–5.00)

## 2023-11-19 MED ORDER — SODIUM CHLORIDE 0.9 % IV SOLN
1600.0000 mg/m2 | INTRAVENOUS | Status: DC
  Administered 2023-11-19: 3500 mg via INTRAVENOUS
  Filled 2023-11-19: qty 70

## 2023-11-19 MED ORDER — ATROPINE SULFATE 1 MG/ML IV SOLN
0.5000 mg | Freq: Once | INTRAVENOUS | Status: AC | PRN
  Administered 2023-11-19: 0.5 mg via INTRAVENOUS
  Filled 2023-11-19: qty 1

## 2023-11-19 MED ORDER — FLUOROURACIL CHEMO INJECTION 500 MG/10ML
200.0000 mg/m2 | Freq: Once | INTRAVENOUS | Status: AC
Start: 2023-11-19 — End: 2023-11-19
  Administered 2023-11-19: 450 mg via INTRAVENOUS
  Filled 2023-11-19: qty 9

## 2023-11-19 MED ORDER — SODIUM CHLORIDE 0.9 % IV SOLN
150.0000 mg | Freq: Once | INTRAVENOUS | Status: AC
  Administered 2023-11-19: 150 mg via INTRAVENOUS
  Filled 2023-11-19: qty 150

## 2023-11-19 MED ORDER — DEXAMETHASONE SODIUM PHOSPHATE 10 MG/ML IJ SOLN
10.0000 mg | Freq: Once | INTRAMUSCULAR | Status: AC
  Administered 2023-11-19: 10 mg via INTRAVENOUS
  Filled 2023-11-19: qty 1

## 2023-11-19 MED ORDER — SODIUM CHLORIDE 0.9 % IV SOLN
200.0000 mg/m2 | Freq: Once | INTRAVENOUS | Status: AC
  Administered 2023-11-19: 444 mg via INTRAVENOUS
  Filled 2023-11-19: qty 22.2

## 2023-11-19 MED ORDER — SODIUM CHLORIDE 0.9 % IV SOLN: Freq: Once | INTRAVENOUS | Status: AC

## 2023-11-19 MED ORDER — SODIUM CHLORIDE 0.9 % IV SOLN
180.0000 mg/m2 | Freq: Once | INTRAVENOUS | Status: AC
  Administered 2023-11-19: 400 mg via INTRAVENOUS
  Filled 2023-11-19: qty 15

## 2023-11-19 MED ORDER — PALONOSETRON HCL INJECTION 0.25 MG/5ML
0.2500 mg | Freq: Once | INTRAVENOUS | Status: AC
  Administered 2023-11-19: 0.25 mg via INTRAVENOUS
  Filled 2023-11-19: qty 5

## 2023-11-19 NOTE — Patient Instructions (Signed)
 [facility] - A DEPT OF [facility]  Discharge Instructions: Thank you for choosing [facility] to provide your oncology and hematology care.   If you have a lab appointment with the Cancer Center, please go directly to the Cancer Center and check in at the registration area.   Wear comfortable clothing and clothing appropriate for easy access to any Portacath or PICC line.   We strive to give you quality time with your provider. You may need to reschedule your appointment if you arrive late (15 or more minutes).  Arriving late affects you and other patients whose appointments are after yours.  Also, if you miss three or more appointments without notifying the office, you may be dismissed from the clinic at the provider's discretion.      For prescription refill requests, have your pharmacy contact our office and allow 72 hours for refills to be completed.    Today you received the following chemotherapy and/or immunotherapy agents :  Irinotecan,  Leucovorin,  Fluorouracil.   To help prevent nausea and vomiting after your treatment, we encourage you to take your nausea medication as directed.  BELOW ARE SYMPTOMS THAT SHOULD BE REPORTED IMMEDIATELY: *FEVER GREATER THAN 100.4 F (38 C) OR HIGHER *CHILLS OR SWEATING *NAUSEA AND VOMITING THAT IS NOT CONTROLLED WITH YOUR NAUSEA MEDICATION *UNUSUAL SHORTNESS OF BREATH *UNUSUAL BRUISING OR BLEEDING *URINARY PROBLEMS (pain or burning when urinating, or frequent urination) *BOWEL PROBLEMS (unusual diarrhea, constipation, pain near the anus) TENDERNESS IN MOUTH AND THROAT WITH OR WITHOUT PRESENCE OF ULCERS (sore throat, sores in mouth, or a toothache) UNUSUAL RASH, SWELLING OR PAIN  UNUSUAL VAGINAL DISCHARGE OR ITCHING   Items with * indicate a potential emergency and should be followed up as soon as possible or go to the Emergency Department if any problems should occur.  Please show the CHEMOTHERAPY  ALERT CARD or IMMUNOTHERAPY ALERT CARD at check-in to the Emergency Department and triage nurse.  Should you have questions after your visit or need to cancel or reschedule your appointment, please contact [facility] - A DEPT OF [facility]  Dept: [phone]  and follow the prompts.  Office hours are 8:00 a.m. to 4:30 p.m. Monday - Friday. Please note that voicemails left after 4:00 p.m. may not be returned until the following business day.  We are closed weekends and major holidays. You have access to a nurse at all times for urgent questions. Please call the main number to the clinic Dept: [phone] and follow the prompts.   For any non-urgent questions, you may also contact your provider using MyChart. We now offer e-Visits for anyone 52 and older to request care online for non-urgent symptoms. For details visit [email or web address].   Also download the MyChart app! Go to the app store, search "MyChart", open the app, select [facility], and log in with your MyChart username and password.

## 2023-11-19 NOTE — Progress Notes (Signed)
 Patient seen by Dr. Thornton Papas today  Vitals are within treatment parameters:Yes   Labs are within treatment parameters: Yes  No intervention for elevated glucose  Treatment plan has been signed: Yes   Per physician team, Patient is ready for treatment. Please note the following modifications:  Avastin removed in preparation of dental extractions.

## 2023-11-19 NOTE — Progress Notes (Signed)
 [facility] OFFICE PROGRESS NOTE   Diagnosis: Colon cancer  INTERVAL HISTORY:   Anthony Calhoun returns as scheduled.  He reports no further rectal bleeding after he used and Anusol suppository.  He has developed a "boil "at the right upper thigh.  He no longer has tooth pain, but plans to have tooth extractions as soon as possible.  Objective:  Vital signs in last 24 hours:  Blood pressure 121/70, pulse 91, temperature 98.2 F (36.8 C), resp. rate 18, height 5\' 6"  (1.676 m), weight 245 lb 8 oz (111.4 kg), SpO2 97%.    HEENT: No thrush, small ecchymosis at the left buccal mucosa Resp: Lungs clear bilaterally Cardio: Regular rate and rhythm GI: No hepatosplenomegaly, no mass, mild tenderness in the mid upper abdomen Vascular: No leg edema Skin: Mild yeast rash at the groin bilaterally, soft erythematous "cyst "at the right upper inner thigh  Portacath/PICC-without erythema  Lab Results:  Lab Results  Component Value Date   WBC 4.8 11/19/2023   HGB 10.0 (L) 11/19/2023   HCT 33.7 (L) 11/19/2023   MCV 78.9 (L) 11/19/2023   PLT 174 11/19/2023   NEUTROABS 3.3 11/19/2023    CMP  Lab Results  Component Value Date   NA 138 11/05/2023   K 3.7 11/05/2023   CL 103 11/05/2023   CO2 27 11/05/2023   GLUCOSE 200 (H) 11/05/2023   BUN 8 11/05/2023   CREATININE 0.74 11/05/2023   CALCIUM 9.0 11/05/2023   PROT 7.4 11/05/2023   ALBUMIN 4.3 11/05/2023   AST 26 11/05/2023   ALT 30 11/05/2023   ALKPHOS 194 (H) 11/05/2023   BILITOT 0.8 11/05/2023   GFRNONAA >60 11/05/2023   GFRAA >60 05/10/2020    Lab Results  Component Value Date   CEA1 1.08 03/07/2021   CEA 12.13 (H) 11/05/2023      Medications: I have reviewed the patient's current medications.   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 secondary to nausea (patient discontinued Xeloda on day 11 due to colonoscopy) Colonoscopy 07/09/2020-nodular mucosa at the colonic anastomosis, biopsied; patent end-to-side ileocolonic anastomosis characterized by healthy-appearing mucosa and visible sutures; nonbleeding internal hemorrhoids, biopsy with benign ulcerated anastomotic mucosa with granulation tissue Cycle 4 adjuvant Xeloda 07/19/2020, 2000 mg every morning and 1500 mg every afternoon Cycle 5 adjuvant Xeloda 08/09/2020 Cycle 6 adjuvant Xeloda 08/31/2019 Cycle 7 adjuvant Xeloda 09/20/2020 Cycle 8 adjuvant Xeloda 10/11/2020 CTs 03/07/2021-no evidence of recurrent disease, hepatic steatosis, slight wall thickening at the junction of the descending and horizontal duodenum Mild elevation of CEA 2023 12/01/2021-Guardant Reveal-ct DNA detected PET 12/30/2021-hypermetabolic right liver lesion, hypermetabolic area in a loop of mid small bowel with an SUV of 12.5, review of PET images at GI tumor conference 01/11/2022-small bowel uptake felt to be a benign finding MRI liver 01/10/2022-solitary 1.2 cm right liver lesion between segments 7 and 8 compatible with metastasis, subtle changes suggesting cirrhosis, hepatic steatosis CT enteroscopy 02/02/2022-small bowel loop with hypermetabolic activity on PET continues to have a bandlike density along the margin felt to represent a diverticulum or adjacent nodal tissue.  Small  bowel tumor not excluded.  3-4 mm left omental nodule 02/08/2022-biopsy and ablation of solitary liver lesion, adenocarcinoma consistent with metastatic colorectal adenocarcinoma CTs 03/31/2022-unchanged circumstantial  soft tissue thickening surrounding a loop of mid to distal small bowel with an adjacent nodular focus measuring 1.2 cm.,  Ablation cavity in segment 7, no other evidence of metastatic disease Diagnostic laparoscopy 05/26/2022-mid small bowel mass-resected, well to moderately differentiated adenocarcinoma, 0/2 lymph nodes, morphology consistent with a colon primary, tumor involved full-thickness including serosa with perineural and large vessel involvement; foundation 1-microsatellite stable, tumor mutation burden 2, K-ras Q61H CT abdomen/pelvis 07/10/2022-the right liver ablation defect is smaller, new subtle right liver lesion measuring 13 x 9 mm, enlarged lymph node in the right upper quadrant mesentery adjacent to surgical anastomosis PET 07/26/2022-new segment 6 liver lesion, enlarging hypermetabolic left omental lesion, there is another mildly hypermetabolic peritoneal implant, 7 mm omental nodule at the midline without hypermetabolism Cycle 1 FOLFOX 09/25/2022 Cycle 2 FOLFOX/bevacizumab 10/09/2022, 5-FU dose reduced secondary to mucositis Cycle 3 FOLFOX/bevacizumab 10/23/2022 Cycle 4 FOLFOX/bevacizumab 11/06/2022 Cycle 5 FOLFOX/bevacizumab 11/20/2022 Cycle 6 FOLFOX/bevacizumab 12/04/2022 CT abdomen/pelvis 12/14/2022-further contraction of the treated segment 7 lesion, slight enlargement of segment 6 lesion, no new liver lesion, increased size of ileocolic nodes and an omental lesion, stable small retroperitoneal nodes Cycle 7 FOLFOX/bevacizumab 12/18/2022 Cycle 8 FOLFOX/bevacizumab 01/02/2023, oxaliplatin dose reduced secondary to prolonged nausea and neuropathy symptoms, Emend added Cycle 9 FOLFOX/bevacizumab 01/16/2023, oxaliplatin discontinued secondary to neuropathy Cycle 1 FOLFIRI 02/19/2023, Avastin held due to recent dental extractions Cycle 2 FOLFIRI/Avastin 03/05/2023 Cycle 3 FOLFIRI/Avastin 03/19/2023, 5-FU bolus held due to mucositis after cycle 2, Fulphila added Cycle 4 FOLFIRI/Avastin 04/02/2023,  Fulphila, Emend Cycle 5 FOLFIRI/Avastin 04/16/2023, Fulphila, Emend CTs 05/02/2023-similar ileocolic mesenteric adenopathy and omental metastasis, cirrhosis with portal hypertension, stable liver lesions, no new or progressive disease Cycle 6 FOLFIRI/Avastin 05/07/2023, Fulphila, Emend Cycle 7 FOLFIRI/Avastin 05/21/2023, Fulphila, Emend Cycle 8 FOLFIRI 06/04/2023, Fulphila, Emend; Avastin held Cycle 9 FOLFIRI 06/18/2023, [name], Emend, Avastin held Treatment held 07/09/2023 patient request Cycle 10 FOLFIRI 07/18/2023, Fulphila, Emend, Avastin held CTs 07/31/2023: Stable with unchanged hepatic lesions, mesenteric lymph nodes, and left omental nodule, unchanged morphologic changes of cirrhosis and portal hypertension Cycle 11 FOLFIRI/Avastin 08/01/2023, Fulphila Chemotherapy held 08/15/2023 per patient request Cycle 12 FOLFIRI/Avastin 08/30/2023, Udenyca Cycle 13 FOLFIRI/Avastin 09/19/2023, Udenyca Cycle 14 FOLFIRI/Avastin 10/01/2023, Udenyca Cycle 15 FOLFIRI/Avastin 10/22/2023, Udenyca 11/05/2023 treatment held per patient request due to dental issue Cycle 16 FOLFIRI 11/19/2023, Udenyca, Avastin held secondary to planned dental extractions Microcytic anemia secondary to #1-improved Colon polyps-sessile serrated adenoma of the descending colon, tubular adenomas with focal high-grade dysplasia of the transverse colon on colonoscopy 02/12/2020, tubular adenoma of the mid ascending colon on the surgical specimen 04/12/2020 Family history of colon cancer Depression Anxiety/panic attacks Hypertension Hyperlipidemia Nausea secondary to Xeloda?-Resolved Gastroesophageal reflux disease-improved following discontinuation of Xeloda Anemia, microcytic; ferritin 6 01/24/2022; stool positive for blood x3 01/25/2022; 02/02/2022 CT abdomen/pelvis enterography-bowel loop demonstrating hypermetabolic activity continues to have a bandlike density along its margin possibly representing a diverticulum or adjacent nodal tissue,  difficult to exclude small bowel tumor, 3 x 4 mm left omental nodule or lymph node, known right hepatic lobe tumor is occult on CT. Venofer weekly x3 beginning 03/03/2022 Negative capsule endoscopy 03/09/2022 Venofer 03/03/2022 for 3 weekly doses Venofer 05/03/2022, 05/19/2022 Venofer 07/28/2022, 08/04/2022 Venofer 03/22/2023, 03/29/2023, 04/04/2023 Venofer 06/18/2023, 07/09/2023 07/03/2023 upper endoscopy and colonoscopy-severe portal hypertensive gastropathy in the gastric fundus, gastric body, stomach, red blood found greater curvature of the  stomach and gastric antrum, treated with APC; colonoscopy showed dark old maroon-colored blood in the entire colon.  No evidence of active or recent bleeding in the colon. 12.  Anterior left neck nodule 03/17/2022-cyst?,  Lymph node? 13.  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  Oxaliplatin neuropathy-mild loss of vibratory sense 12/04/2022, 12/18/2022, 01/02/2023     Disposition: Anthony Calhoun appears stable.  He will resume chemotherapy with FOLFIRI today.  Avastin will be held due to anticipated dental extractions.  He will try to have the extractions performed this week.  He will undergo a restaging CT evaluation prior to an office visit in 2 weeks.  Thornton Papas, MD  11/19/2023  9:32 AM

## 2023-11-21 ENCOUNTER — Inpatient Hospital Stay: Payer: 59

## 2023-11-21 ENCOUNTER — Other Ambulatory Visit: Payer: Self-pay | Admitting: Oncology

## 2023-11-21 ENCOUNTER — Encounter: Payer: Self-pay | Admitting: Oncology

## 2023-11-21 ENCOUNTER — Other Ambulatory Visit: Payer: Self-pay

## 2023-11-21 VITALS — BP 135/74 | HR 70 | Temp 98.1°F | Resp 18

## 2023-11-21 DIAGNOSIS — Z5111 Encounter for antineoplastic chemotherapy: Secondary | ICD-10-CM | POA: Diagnosis not present

## 2023-11-21 DIAGNOSIS — C189 Malignant neoplasm of colon, unspecified: Secondary | ICD-10-CM

## 2023-11-21 DIAGNOSIS — D509 Iron deficiency anemia, unspecified: Secondary | ICD-10-CM | POA: Diagnosis not present

## 2023-11-21 DIAGNOSIS — E785 Hyperlipidemia, unspecified: Secondary | ICD-10-CM | POA: Diagnosis not present

## 2023-11-21 DIAGNOSIS — Z79899 Other long term (current) drug therapy: Secondary | ICD-10-CM | POA: Diagnosis not present

## 2023-11-21 DIAGNOSIS — C182 Malignant neoplasm of ascending colon: Secondary | ICD-10-CM | POA: Diagnosis not present

## 2023-11-21 DIAGNOSIS — K219 Gastro-esophageal reflux disease without esophagitis: Secondary | ICD-10-CM | POA: Diagnosis not present

## 2023-11-21 DIAGNOSIS — I10 Essential (primary) hypertension: Secondary | ICD-10-CM | POA: Diagnosis not present

## 2023-11-21 MED ORDER — PROMETHAZINE HCL 25 MG PO TABS: 25.0000 mg | ORAL_TABLET | Freq: Four times a day (QID) | ORAL | 1 refills | Status: DC | PRN

## 2023-11-21 MED ORDER — SODIUM CHLORIDE 0.9% FLUSH
10.0000 mL | INTRAVENOUS | Status: DC | PRN
  Administered 2023-11-21: 10 mL

## 2023-11-21 MED ORDER — HEPARIN SOD (PORK) LOCK FLUSH 100 UNIT/ML IV SOLN
500.0000 [IU] | Freq: Once | INTRAVENOUS | Status: AC | PRN
  Administered 2023-11-21: 500 [IU]

## 2023-11-21 MED ORDER — PEGFILGRASTIM-CBQV 6 MG/0.6ML ~~LOC~~ SOSY
6.0000 mg | PREFILLED_SYRINGE | Freq: Once | SUBCUTANEOUS | Status: AC
  Administered 2023-11-21: 6 mg via SUBCUTANEOUS
  Filled 2023-11-21: qty 0.6

## 2023-11-21 NOTE — Patient Instructions (Signed)

## 2023-11-23 ENCOUNTER — Encounter: Payer: Self-pay | Admitting: Oncology

## 2023-11-24 DIAGNOSIS — C182 Malignant neoplasm of ascending colon: Secondary | ICD-10-CM | POA: Diagnosis not present

## 2023-11-28 ENCOUNTER — Ambulatory Visit (HOSPITAL_BASED_OUTPATIENT_CLINIC_OR_DEPARTMENT_OTHER)
Admission: RE | Admit: 2023-11-28 | Discharge: 2023-11-28 | Disposition: A | Discharge status: Home / Self Care | Source: Ambulatory Visit | Attending: Oncology | Admitting: Oncology

## 2023-11-28 ENCOUNTER — Other Ambulatory Visit (HOSPITAL_BASED_OUTPATIENT_CLINIC_OR_DEPARTMENT_OTHER): Payer: Self-pay

## 2023-11-28 DIAGNOSIS — C787 Secondary malignant neoplasm of liver and intrahepatic bile duct: Secondary | ICD-10-CM | POA: Diagnosis not present

## 2023-11-28 DIAGNOSIS — I700 Atherosclerosis of aorta: Secondary | ICD-10-CM | POA: Diagnosis not present

## 2023-11-28 DIAGNOSIS — K769 Liver disease, unspecified: Secondary | ICD-10-CM | POA: Diagnosis not present

## 2023-11-28 DIAGNOSIS — C786 Secondary malignant neoplasm of retroperitoneum and peritoneum: Secondary | ICD-10-CM | POA: Diagnosis not present

## 2023-11-28 DIAGNOSIS — C189 Malignant neoplasm of colon, unspecified: Secondary | ICD-10-CM | POA: Diagnosis not present

## 2023-11-28 MED ORDER — IOHEXOL 300 MG/ML  SOLN
100.0000 mL | Freq: Once | INTRAMUSCULAR | Status: AC | PRN
  Administered 2023-11-28: 100 mL via INTRAVENOUS

## 2023-11-30 ENCOUNTER — Encounter: Payer: Self-pay | Admitting: Oncology

## 2023-12-02 ENCOUNTER — Other Ambulatory Visit: Payer: Self-pay | Admitting: Oncology

## 2023-12-03 ENCOUNTER — Inpatient Hospital Stay: Admitting: Nurse Practitioner

## 2023-12-03 ENCOUNTER — Inpatient Hospital Stay

## 2023-12-03 ENCOUNTER — Encounter: Payer: Self-pay | Admitting: Oncology

## 2023-12-04 ENCOUNTER — Other Ambulatory Visit: Payer: Self-pay

## 2023-12-05 ENCOUNTER — Encounter

## 2023-12-10 ENCOUNTER — Inpatient Hospital Stay: Attending: Oncology

## 2023-12-10 ENCOUNTER — Inpatient Hospital Stay

## 2023-12-10 ENCOUNTER — Encounter: Payer: Self-pay | Admitting: Oncology

## 2023-12-10 ENCOUNTER — Inpatient Hospital Stay (HOSPITAL_BASED_OUTPATIENT_CLINIC_OR_DEPARTMENT_OTHER): Admitting: Oncology

## 2023-12-10 VITALS — BP 118/65 | HR 70

## 2023-12-10 VITALS — BP 124/71 | HR 78 | Temp 98.2°F | Resp 18 | Ht 66.0 in | Wt 248.2 lb

## 2023-12-10 DIAGNOSIS — F410 Panic disorder [episodic paroxysmal anxiety] without agoraphobia: Secondary | ICD-10-CM | POA: Insufficient documentation

## 2023-12-10 DIAGNOSIS — C182 Malignant neoplasm of ascending colon: Secondary | ICD-10-CM | POA: Insufficient documentation

## 2023-12-10 DIAGNOSIS — G620 Drug-induced polyneuropathy: Secondary | ICD-10-CM | POA: Insufficient documentation

## 2023-12-10 DIAGNOSIS — Z5111 Encounter for antineoplastic chemotherapy: Secondary | ICD-10-CM | POA: Diagnosis not present

## 2023-12-10 DIAGNOSIS — C787 Secondary malignant neoplasm of liver and intrahepatic bile duct: Secondary | ICD-10-CM | POA: Diagnosis not present

## 2023-12-10 DIAGNOSIS — E785 Hyperlipidemia, unspecified: Secondary | ICD-10-CM | POA: Insufficient documentation

## 2023-12-10 DIAGNOSIS — Z79899 Other long term (current) drug therapy: Secondary | ICD-10-CM | POA: Diagnosis not present

## 2023-12-10 DIAGNOSIS — F32A Depression, unspecified: Secondary | ICD-10-CM | POA: Insufficient documentation

## 2023-12-10 DIAGNOSIS — D509 Iron deficiency anemia, unspecified: Secondary | ICD-10-CM | POA: Insufficient documentation

## 2023-12-10 DIAGNOSIS — I10 Essential (primary) hypertension: Secondary | ICD-10-CM | POA: Insufficient documentation

## 2023-12-10 DIAGNOSIS — K219 Gastro-esophageal reflux disease without esophagitis: Secondary | ICD-10-CM | POA: Diagnosis not present

## 2023-12-10 DIAGNOSIS — C189 Malignant neoplasm of colon, unspecified: Secondary | ICD-10-CM | POA: Diagnosis not present

## 2023-12-10 LAB — CBC WITH DIFFERENTIAL (CANCER CENTER ONLY)
Abs Immature Granulocytes: 0.02 10*3/uL (ref 0.00–0.07)
Basophils Absolute: 0.1 10*3/uL (ref 0.0–0.1)
Basophils Relative: 1 %
Eosinophils Absolute: 0.1 10*3/uL (ref 0.0–0.5)
Eosinophils Relative: 2 %
HCT: 32 % — ABNORMAL LOW (ref 39.0–52.0)
Hemoglobin: 9.6 g/dL — ABNORMAL LOW (ref 13.0–17.0)
Immature Granulocytes: 0 %
Lymphocytes Relative: 17 %
Lymphs Abs: 0.8 10*3/uL (ref 0.7–4.0)
MCH: 23.2 pg — ABNORMAL LOW (ref 26.0–34.0)
MCHC: 30 g/dL (ref 30.0–36.0)
MCV: 77.5 fL — ABNORMAL LOW (ref 80.0–100.0)
Monocytes Absolute: 0.4 10*3/uL (ref 0.1–1.0)
Monocytes Relative: 8 %
Neutro Abs: 3.6 10*3/uL (ref 1.7–7.7)
Neutrophils Relative %: 72 %
Platelet Count: 204 10*3/uL (ref 150–400)
RBC: 4.13 MIL/uL — ABNORMAL LOW (ref 4.22–5.81)
RDW: 18.6 % — ABNORMAL HIGH (ref 11.5–15.5)
WBC Count: 5 10*3/uL (ref 4.0–10.5)
nRBC: 0 % (ref 0.0–0.2)

## 2023-12-10 LAB — CMP (CANCER CENTER ONLY)
ALT: 29 U/L (ref 0–44)
AST: 27 U/L (ref 15–41)
Albumin: 3.9 g/dL (ref 3.5–5.0)
Alkaline Phosphatase: 142 U/L — ABNORMAL HIGH (ref 38–126)
Anion gap: 9 (ref 5–15)
BUN: 12 mg/dL (ref 6–20)
CO2: 27 mmol/L (ref 22–32)
Calcium: 9.1 mg/dL (ref 8.9–10.3)
Chloride: 100 mmol/L (ref 98–111)
Creatinine: 0.77 mg/dL (ref 0.61–1.24)
GFR, Estimated: >60 mL/min (ref >60)
Glucose, Bld: 199 mg/dL — ABNORMAL HIGH (ref 70–99)
Potassium: 4 mmol/L (ref 3.5–5.1)
Sodium: 136 mmol/L (ref 135–145)
Total Bilirubin: 0.7 mg/dL (ref 0.0–1.2)
Total Protein: 7.2 g/dL (ref 6.5–8.1)

## 2023-12-10 LAB — TOTAL PROTEIN, URINE DIPSTICK: Protein, ur: NEGATIVE mg/dL

## 2023-12-10 LAB — CEA (ACCESS): CEA (CHCC): 14.72 ng/mL — ABNORMAL HIGH (ref 0.00–5.00)

## 2023-12-10 MED ORDER — SODIUM CHLORIDE 0.9 % IV SOLN
180.0000 mg/m2 | Freq: Once | INTRAVENOUS | Status: AC
  Administered 2023-12-10: 400 mg via INTRAVENOUS
  Filled 2023-12-10: qty 15

## 2023-12-10 MED ORDER — ATROPINE SULFATE 1 MG/ML IV SOLN
0.5000 mg | Freq: Once | INTRAVENOUS | Status: AC | PRN
  Administered 2023-12-10: 0.5 mg via INTRAVENOUS
  Filled 2023-12-10: qty 1

## 2023-12-10 MED ORDER — SODIUM CHLORIDE 0.9 % IV SOLN
150.0000 mg | Freq: Once | INTRAVENOUS | Status: AC
  Administered 2023-12-10: 150 mg via INTRAVENOUS
  Filled 2023-12-10: qty 150

## 2023-12-10 MED ORDER — SODIUM CHLORIDE 0.9 % IV SOLN: INTRAVENOUS | Status: DC

## 2023-12-10 MED ORDER — SODIUM CHLORIDE 0.9 % IV SOLN
500.0000 mg | Freq: Once | INTRAVENOUS | Status: AC
  Administered 2023-12-10: 500 mg via INTRAVENOUS
  Filled 2023-12-10: qty 16

## 2023-12-10 MED ORDER — PALONOSETRON HCL INJECTION 0.25 MG/5ML
0.2500 mg | Freq: Once | INTRAVENOUS | Status: AC
  Administered 2023-12-10: 0.25 mg via INTRAVENOUS
  Filled 2023-12-10: qty 5

## 2023-12-10 MED ORDER — SODIUM CHLORIDE 0.9 % IV SOLN
1600.0000 mg/m2 | INTRAVENOUS | Status: DC
  Administered 2023-12-10: 3500 mg via INTRAVENOUS
  Filled 2023-12-10: qty 20

## 2023-12-10 MED ORDER — SODIUM CHLORIDE 0.9 % IV SOLN
200.0000 mg/m2 | Freq: Once | INTRAVENOUS | Status: AC
  Administered 2023-12-10: 444 mg via INTRAVENOUS
  Filled 2023-12-10: qty 22.2

## 2023-12-10 MED ORDER — DEXAMETHASONE SODIUM PHOSPHATE 10 MG/ML IJ SOLN
10.0000 mg | Freq: Once | INTRAMUSCULAR | Status: AC
  Administered 2023-12-10: 10 mg via INTRAVENOUS
  Filled 2023-12-10: qty 1

## 2023-12-10 MED ORDER — FLUOROURACIL CHEMO INJECTION 500 MG/10ML
200.0000 mg/m2 | Freq: Once | INTRAVENOUS | Status: AC
  Administered 2023-12-10: 450 mg via INTRAVENOUS
  Filled 2023-12-10: qty 9

## 2023-12-10 NOTE — Progress Notes (Signed)
 [facility] OFFICE PROGRESS NOTE   Diagnosis: Colon cancer  INTERVAL HISTORY:   Anthony Calhoun returns as scheduled.  He feels well.  He no longer has tooth pain.  He has decided against a dental procedure.  Good appetite.  No bleeding  Objective:  Vital signs in last 24 hours:  Blood pressure 124/71, pulse 78, temperature 98.2 F (36.8 C), temperature source Temporal, resp. rate 18, height 5\' 6"  (1.676 m), weight 248 lb 3.2 oz (112.6 kg), SpO2 99%.    HEENT: No thrush or ulcers Lymphatics: No cervical, supraclavicular, axillary, or inguinal nodes Resp: Lungs clear bilaterally Cardio: Regular rate and rhythm GI: No hepatosplenomegaly, no mass, nontender Vascular: No leg edema    Portacath/PICC-without erythema  Lab Results:  Lab Results  Component Value Date   WBC 5.0 12/10/2023   HGB 9.6 (L) 12/10/2023   HCT 32.0 (L) 12/10/2023   MCV 77.5 (L) 12/10/2023   PLT 204 12/10/2023   NEUTROABS 3.6 12/10/2023    CMP  Lab Results  Component Value Date   NA 136 11/19/2023   K 3.8 11/19/2023   CL 101 11/19/2023   CO2 27 11/19/2023   GLUCOSE 238 (H) 11/19/2023   BUN 14 11/19/2023   CREATININE 0.90 11/19/2023   CALCIUM 8.7 (L) 11/19/2023   PROT 7.0 11/19/2023   ALBUMIN 4.0 11/19/2023   AST 41 11/19/2023   ALT 42 11/19/2023   ALKPHOS 154 (H) 11/19/2023   BILITOT 0.9 11/19/2023   GFRNONAA >60 11/19/2023   GFRAA >60 05/10/2020    Lab Results  Component Value Date   CEA1 1.08 03/07/2021   CEA 13.98 (H) 11/19/2023     Medications: I have reviewed the patient's current medications.   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 secondary to nausea (patient discontinued Xeloda on day 11 due to colonoscopy) Colonoscopy 07/09/2020-nodular mucosa at the colonic anastomosis, biopsied; patent end-to-side ileocolonic anastomosis characterized by healthy-appearing mucosa and visible sutures; nonbleeding internal hemorrhoids, biopsy with benign ulcerated anastomotic mucosa with granulation tissue Cycle 4 adjuvant Xeloda 07/19/2020, 2000 mg every morning and 1500 mg every afternoon Cycle 5 adjuvant Xeloda 08/09/2020 Cycle 6 adjuvant Xeloda 08/31/2019 Cycle 7 adjuvant Xeloda 09/20/2020 Cycle 8 adjuvant Xeloda 10/11/2020 CTs 03/07/2021-no evidence of recurrent disease, hepatic steatosis, slight wall thickening at the junction of the descending and horizontal duodenum Mild elevation of CEA 2023 12/01/2021-Guardant Reveal-ct DNA detected PET 12/30/2021-hypermetabolic right liver lesion, hypermetabolic area in a loop of mid small bowel with an SUV of 12.5, review of PET images at GI tumor conference 01/11/2022-small bowel uptake felt to be a benign finding MRI liver 01/10/2022-solitary 1.2 cm right liver lesion between segments 7 and 8 compatible with metastasis, subtle changes suggesting cirrhosis, hepatic steatosis CT enteroscopy 02/02/2022-small bowel loop with hypermetabolic activity on PET continues to have a bandlike density along the margin felt to represent a diverticulum or adjacent nodal tissue.  Small bowel tumor not excluded.  3-4 mm left omental nodule 02/08/2022-biopsy and ablation of solitary liver lesion, adenocarcinoma consistent with metastatic colorectal adenocarcinoma CTs 03/31/2022-unchanged circumstantial soft tissue thickening surrounding a  loop of mid to distal small bowel with an adjacent nodular focus measuring 1.2 cm.,  Ablation cavity in segment 7, no other evidence  of metastatic disease Diagnostic laparoscopy 05/26/2022-mid small bowel mass-resected, well to moderately differentiated adenocarcinoma, 0/2 lymph nodes, morphology consistent with a colon primary, tumor involved full-thickness including serosa with perineural and large vessel involvement; foundation 1-microsatellite stable, tumor mutation burden 2, K-ras Q61H CT abdomen/pelvis 07/10/2022-the right liver ablation defect is smaller, new subtle right liver lesion measuring 13 x 9 mm, enlarged lymph node in the right upper quadrant mesentery adjacent to surgical anastomosis PET 07/26/2022-new segment 6 liver lesion, enlarging hypermetabolic left omental lesion, there is another mildly hypermetabolic peritoneal implant, 7 mm omental nodule at the midline without hypermetabolism Cycle 1 FOLFOX 09/25/2022 Cycle 2 FOLFOX/bevacizumab 10/09/2022, 5-FU dose reduced secondary to mucositis Cycle 3 FOLFOX/bevacizumab 10/23/2022 Cycle 4 FOLFOX/bevacizumab 11/06/2022 Cycle 5 FOLFOX/bevacizumab 11/20/2022 Cycle 6 FOLFOX/bevacizumab 12/04/2022 CT abdomen/pelvis 12/14/2022-further contraction of the treated segment 7 lesion, slight enlargement of segment 6 lesion, no new liver lesion, increased size of ileocolic nodes and an omental lesion, stable small retroperitoneal nodes Cycle 7 FOLFOX/bevacizumab 12/18/2022 Cycle 8 FOLFOX/bevacizumab 01/02/2023, oxaliplatin dose reduced secondary to prolonged nausea and neuropathy symptoms, Emend added Cycle 9 FOLFOX/bevacizumab 01/16/2023, oxaliplatin discontinued secondary to neuropathy Cycle 1 FOLFIRI 02/19/2023, Avastin held due to recent dental extractions Cycle 2 FOLFIRI/Avastin 03/05/2023 Cycle 3 FOLFIRI/Avastin 03/19/2023, 5-FU bolus held due to mucositis after cycle 2, Fulphila added Cycle 4 FOLFIRI/Avastin 04/02/2023, Fulphila, Emend Cycle 5 FOLFIRI/Avastin 04/16/2023, Fulphila, Emend CTs 05/02/2023-similar ileocolic mesenteric adenopathy and omental metastasis, cirrhosis with portal  hypertension, stable liver lesions, no new or progressive disease Cycle 6 FOLFIRI/Avastin 05/07/2023, Fulphila, Emend Cycle 7 FOLFIRI/Avastin 05/21/2023, Fulphila, Emend Cycle 8 FOLFIRI 06/04/2023, Fulphila, Emend; Avastin held Cycle 9 FOLFIRI 06/18/2023, [name], Emend, Avastin held Treatment held 07/09/2023 patient request Cycle 10 FOLFIRI 07/18/2023, Fulphila, Emend, Avastin held CTs 07/31/2023: Stable with unchanged hepatic lesions, mesenteric lymph nodes, and left omental nodule, unchanged morphologic changes of cirrhosis and portal hypertension Cycle 11 FOLFIRI/Avastin 08/01/2023, Fulphila Chemotherapy held 08/15/2023 per patient request Cycle 12 FOLFIRI/Avastin 08/30/2023, Udenyca Cycle 13 FOLFIRI/Avastin 09/19/2023, Udenyca Cycle 14 FOLFIRI/Avastin 10/01/2023, Udenyca Cycle 15 FOLFIRI/Avastin 10/22/2023, Udenyca 11/05/2023 treatment held per patient request due to dental issue Cycle 16 FOLFIRI 11/19/2023, Udenyca, Avastin held secondary to planned dental extractions CTs 11/28/2023: No evidence of progressive disease, stable hepatic, left upper quadrant omental, and ileocolic mesenteric lesions Cycle 17 FOLFIRI/Avastin 12/10/2023 Microcytic anemia secondary to #1-improved Colon polyps-sessile serrated adenoma of the descending colon, tubular adenomas with focal high-grade dysplasia of the transverse colon on colonoscopy 02/12/2020, tubular adenoma of the mid ascending colon on the surgical specimen 04/12/2020 Family history of colon cancer Depression Anxiety/panic attacks Hypertension Hyperlipidemia Nausea secondary to Xeloda?-Resolved Gastroesophageal reflux disease-improved following discontinuation of Xeloda Anemia, microcytic; ferritin 6 01/24/2022; stool positive for blood x3 01/25/2022; 02/02/2022 CT abdomen/pelvis enterography-bowel loop demonstrating hypermetabolic activity continues to have a bandlike density along its margin possibly representing a diverticulum or adjacent nodal tissue,  difficult to exclude small bowel tumor, 3 x 4 mm left omental nodule or lymph node, known right hepatic lobe tumor is occult on CT. Venofer weekly x3 beginning 03/03/2022 Negative capsule endoscopy 03/09/2022 Venofer 03/03/2022 for 3 weekly doses Venofer 05/03/2022, 05/19/2022 Venofer 07/28/2022, 08/04/2022 Venofer 03/22/2023, 03/29/2023, 04/04/2023 Venofer 06/18/2023, 07/09/2023 07/03/2023 upper endoscopy and colonoscopy-severe portal hypertensive gastropathy in the gastric fundus, gastric body, stomach, red blood found greater curvature of the stomach and gastric antrum, treated with APC; colonoscopy showed dark  old maroon-colored blood in the entire colon.  No evidence of active or recent bleeding in the colon. 12.  Anterior left neck nodule 03/17/2022-cyst?,  Lymph node? 13.  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  Oxaliplatin neuropathy-mild loss of vibratory sense 12/04/2022, 12/18/2022, 01/02/2023      Disposition: Anthony Calhoun appears stable.  The restaging CTs reveal stable disease.  I reviewed the CT findings and images with him.  We discussed treatment options.  He reports feeling significantly better with a longer interval between chemotherapy.  We decided to continue FOLFIRI/Avastin on a 3-week schedule.  He will complete another cycle today.  He will return for an office visit and chemotherapy in 3 weeks.  Coni Deep, MD  12/10/2023  10:16 AM

## 2023-12-10 NOTE — Progress Notes (Signed)
 Patient seen by Dr. Coni Deep today  Vitals are within treatment parameters:Yes   Labs are within treatment parameters: Yes   Treatment plan has been signed: Yes   Per physician team, Patient is ready for treatment. Please note the following modifications: MD has added Avastin back to careplan since he is not planning on dental extractions.

## 2023-12-10 NOTE — Patient Instructions (Signed)
 [facility] - A DEPT OF [facility]   Discharge Instructions: The chemotherapy medication bag should finish at 46 hours, 96 hours, or 7 days. For example, if your pump is scheduled for 46 hours and it was put on at 4:00 p.m., it should finish at 2:00 p.m. the day it is scheduled to come off regardless of your appointment time.     Estimated time to finish at 12:45 WEDNESDAY,  December 12, 2023.   If the display on your pump reads "Low Volume" and it is beeping, take the batteries out of the pump and come to the cancer center for it to be taken off.   If the pump alarms go off prior to the pump reading "Low Volume" then call [phone] and someone can assist you.  If the plunger comes out and the chemotherapy medication is leaking out, please use your home chemo spill kit to clean up the spill. Do NOT use paper towels or other household products.  If you have problems or questions regarding your pump, please call either [phone] (24 hours a day) or the cancer center Monday-Friday 8:00 a.m.- 4:30 p.m. at the clinic number and we will assist you. If you are unable to get assistance, then go to the nearest Emergency Department and ask the staff to contact the IV team for assistance.   Thank you for choosing [facility] to provide your oncology and hematology care.   If you have a lab appointment with the Cancer Center, please go directly to the Cancer Center and check in at the registration area.   Wear comfortable clothing and clothing appropriate for easy access to any Portacath or PICC line.   We strive to give you quality time with your provider. You may need to reschedule your appointment if you arrive late (15 or more minutes).  Arriving late affects you and other patients whose appointments are after yours.  Also, if you miss three or more appointments without notifying the office, you may be dismissed from the clinic at the provider's  discretion.      For prescription refill requests, have your pharmacy contact our office and allow 72 hours for refills to be completed.    Today you received the following chemotherapy and/or immunotherapy agents AVASTIN/ IRINOTECAN/LEUCOVORIN/FLUOROURACIL      To help prevent nausea and vomiting after your treatment, we encourage you to take your nausea medication as directed.  BELOW ARE SYMPTOMS THAT SHOULD BE REPORTED IMMEDIATELY: *FEVER GREATER THAN 100.4 F (38 C) OR HIGHER *CHILLS OR SWEATING *NAUSEA AND VOMITING THAT IS NOT CONTROLLED WITH YOUR NAUSEA MEDICATION *UNUSUAL SHORTNESS OF BREATH *UNUSUAL BRUISING OR BLEEDING *URINARY PROBLEMS (pain or burning when urinating, or frequent urination) *BOWEL PROBLEMS (unusual diarrhea, constipation, pain near the anus) TENDERNESS IN MOUTH AND THROAT WITH OR WITHOUT PRESENCE OF ULCERS (sore throat, sores in mouth, or a toothache) UNUSUAL RASH, SWELLING OR PAIN  UNUSUAL VAGINAL DISCHARGE OR ITCHING   Items with * indicate a potential emergency and should be followed up as soon as possible or go to the Emergency Department if any problems should occur.  Please show the CHEMOTHERAPY ALERT CARD or IMMUNOTHERAPY ALERT CARD at check-in to the Emergency Department and triage nurse.  Should you have questions after your visit or need to cancel or reschedule your appointment, please contact [facility] - A DEPT OF [facility]  Dept: [phone]  and follow the prompts.  Office hours are 8:00 a.m. to 4:30 p.m. Monday - Friday. Please note that voicemails left after 4:00 p.m. may not be returned until the following business day.  We are closed weekends and major holidays. You have access to a nurse at all times for urgent questions. Please call the main number to the clinic Dept: [phone] and follow the prompts.   For any non-urgent questions, you may also contact your provider using MyChart. We now offer e-Visits  for anyone 83 and older to request care online for non-urgent symptoms. For details visit [email or web address].   Also download the MyChart app! Go to the app store, search "MyChart", open the app, select [facility], and log in with your MyChart username and password.  Bevacizumab Injection What is this medication? BEVACIZUMAB (be va SIZ yoo mab) treats some types of cancer. It works by blocking a protein that causes cancer cells to grow and multiply. This helps to slow or stop the spread of cancer cells. It is a monoclonal antibody. This medicine may be used for other purposes; ask your health care provider or pharmacist if you have questions. COMMON BRAND NAME(S): Alymsys, Avastin, MVASI, [place] Lao What should I tell my care team before I take this medication? They need to know if you have any of these conditions: Blood clots Coughing up blood Having or recent surgery Heart failure High blood pressure History of a connection between 2 or more body parts that do not usually connect (fistula) History of a tear in your stomach or intestines Protein in your urine An unusual or allergic reaction to bevacizumab, other medications, foods, dyes, or preservatives Pregnant or trying to get pregnant Breast-feeding How should I use this medication? This medication is injected into a vein. It is given by your care team in a hospital or clinic setting. Talk to your care team the use of this medication in children. Special care may be needed. Overdosage: If you think you have taken too much of this medicine contact a poison control center or emergency room at once. NOTE: This medicine is only for you. Do not share this medicine with others. What if I miss a dose? Keep appointments for follow-up doses. It is important not to miss your dose. Call your care team if you are unable to keep an appointment. What may interact with this medication? Interactions are not expected. This list may not  describe all possible interactions. Give your health care provider a list of all the medicines, herbs, non-prescription drugs, or dietary supplements you use. Also tell them if you smoke, drink alcohol, or use illegal drugs. Some items may interact with your medicine. What should I watch for while using this medication? Your condition will be monitored carefully while you are receiving this medication. You may need blood work while taking this medication. This medication may make you feel generally unwell. This is not uncommon as chemotherapy can affect healthy cells as well as cancer cells. Report any side effects. Continue your course of treatment even though you feel ill unless your care team tells you to stop. This medication may increase your risk to bruise or bleed. Call your care team if you notice any unusual bleeding. Before having surgery, talk to your care team to make sure it is ok. This medication can increase the risk of poor healing of your surgical site or wound. You will need to stop this medication for 28 days before surgery. After surgery, wait at least 28 days before restarting this  medication. Make sure the surgical site or wound is healed enough before restarting this medication. Talk to your care team if questions. Talk to your care team if you may be pregnant. Serious birth defects can occur if you take this medication during pregnancy and for 6 months after the last dose. Contraception is recommended while taking this medication and for 6 months after the last dose. Your care team can help you find the option that works for you. Do not breastfeed while taking this medication and for 6 months after the last dose. This medication can cause infertility. Talk to your care team if you are concerned about your fertility. What side effects may I notice from receiving this medication? Side effects that you should report to your care team as soon as possible: Allergic reactions--skin rash,  itching, hives, swelling of the face, lips, tongue, or throat Bleeding--bloody or black, tar-like stools, vomiting blood or brown material that looks like coffee grounds, red or dark brown urine, small red or purple spots on skin, unusual bruising or bleeding Blood clot--pain, swelling, or warmth in the leg, shortness of breath, chest pain Heart attack--pain or tightness in the chest, shoulders, arms, or jaw, nausea, shortness of breath, cold or clammy skin, feeling faint or lightheaded Heart failure--shortness of breath, swelling of the ankles, feet, or hands, sudden weight gain, unusual weakness or fatigue Increase in blood pressure Infection--fever, chills, cough, sore throat, wounds that don't heal, pain or trouble when passing urine, general feeling of discomfort or being unwell Infusion reactions--chest pain, shortness of breath or trouble breathing, feeling faint or lightheaded Kidney injury--decrease in the amount of urine, swelling of the ankles, hands, or feet Stomach pain that is severe, does not go away, or gets worse Stroke--sudden numbness or weakness of the face, arm, or leg, trouble speaking, confusion, trouble walking, loss of balance or coordination, dizziness, severe headache, change in vision Sudden and severe headache, confusion, change in vision, seizures, which may be signs of posterior reversible encephalopathy syndrome (PRES) Side effects that usually do not require medical attention (report to your care team if they continue or are bothersome): Back pain Change in taste Diarrhea Dry skin Increased tears Nosebleed This list may not describe all possible side effects. Call your doctor for medical advice about side effects. You may report side effects to FDA at 1-800-FDA-1088. Where should I keep my medication? This medication is given in a hospital or clinic. It will not be stored at home. NOTE: This sheet is a summary. It may not cover all possible information. If you  have questions about this medicine, talk to your doctor, pharmacist, or health care provider.  2024 Elsevier/Gold Standard (2021-12-30 00:00:00)  Irinotecan Injection What is this medication? IRINOTECAN (ir in oh TEE kan) treats some types of cancer. It works by slowing down the growth of cancer cells. This medicine may be used for other purposes; ask your health care provider or pharmacist if you have questions. COMMON BRAND NAME(S): Camptosar What should I tell my care team before I take this medication? They need to know if you have any of these conditions: Dehydration Diarrhea Infection, especially a viral infection, such as chickenpox, cold sores, herpes Liver disease Low blood cell levels (white cells, red cells, and platelets) Low levels of electrolytes, such as calcium, magnesium, or potassium in your blood Recent or ongoing radiation An unusual or allergic reaction to irinotecan, other medications, foods, dyes, or preservatives If you or your partner are pregnant or trying to  get pregnant Breast-feeding How should I use this medication? This medication is injected into a vein. It is given by your care team in a hospital or clinic setting. Talk to your care team about the use of this medication in children. Special care may be needed. Overdosage: If you think you have taken too much of this medicine contact a poison control center or emergency room at once. NOTE: This medicine is only for you. Do not share this medicine with others. What if I miss a dose? Keep appointments for follow-up doses. It is important not to miss your dose. Call your care team if you are unable to keep an appointment. What may interact with this medication? Do not take this medication with any of the following: Cobicistat Itraconazole This medication may also interact with the following: Certain antibiotics, such as clarithromycin, rifampin, rifabutin Certain antivirals for HIV or AIDS Certain  medications for fungal infections, such as ketoconazole, posaconazole, voriconazole Certain medications for seizures, such as carbamazepine, phenobarbital, phenytoin Gemfibrozil Nefazodone St. John's wort This list may not describe all possible interactions. Give your health care provider a list of all the medicines, herbs, non-prescription drugs, or dietary supplements you use. Also tell them if you smoke, drink alcohol, or use illegal drugs. Some items may interact with your medicine. What should I watch for while using this medication? Your condition will be monitored carefully while you are receiving this medication. You may need blood work while taking this medication. This medication may make you feel generally unwell. This is not uncommon as chemotherapy can affect healthy cells as well as cancer cells. Report any side effects. Continue your course of treatment even though you feel ill unless your care team tells you to stop. This medication can cause serious side effects. To reduce the risk, your care team may give you other medications to take before receiving this one. Be sure to follow the directions from your care team. This medication may affect your coordination, reaction time, or judgement. Do not drive or operate machinery until you know how this medication affects you. Sit up or stand slowly to reduce the risk of dizzy or fainting spells. Drinking alcohol with this medication can increase the risk of these side effects. This medication may increase your risk of getting an infection. Call your care team for advice if you get a fever, chills, sore throat, or other symptoms of a cold or flu. Do not treat yourself. Try to avoid being around people who are sick. Avoid taking medications that contain aspirin, acetaminophen, ibuprofen, naproxen, or ketoprofen unless instructed by your care team. These medications may hide a fever. This medication may increase your risk to bruise or bleed. Call  your care team if you notice any unusual bleeding. Be careful brushing or flossing your teeth or using a toothpick because you may get an infection or bleed more easily. If you have any dental work done, tell your dentist you are receiving this medication. Talk to your care team if you or your partner are pregnant or think either of you might be pregnant. This medication can cause serious birth defects if taken during pregnancy and for 6 months after the last dose. You will need a negative pregnancy test before starting this medication. Contraception is recommended while taking this medication and for 6 months after the last dose. Your care team can help you find the option that works for you. Do not father a child while taking this medication and for 3  months after the last dose. Use a condom for contraception during this time period. Do not breastfeed while taking this medication and for 7 days after the last dose. This medication may cause infertility. Talk to your care team if you are concerned about your fertility. What side effects may I notice from receiving this medication? Side effects that you should report to your care team as soon as possible: Allergic reactions--skin rash, itching, hives, swelling of the face, lips, tongue, or throat Dry cough, shortness of breath or trouble breathing Increased saliva or tears, increased sweating, stomach cramping, diarrhea, small pupils, unusual weakness or fatigue, slow heartbeat Infection--fever, chills, cough, sore throat, wounds that don't heal, pain or trouble when passing urine, general feeling of discomfort or being unwell Kidney injury--decrease in the amount of urine, swelling of the ankles, hands, or feet Low red blood cell level--unusual weakness or fatigue, dizziness, headache, trouble breathing Severe or prolonged diarrhea Unusual bruising or bleeding Side effects that usually do not require medical attention (report to your care team if  they continue or are bothersome): Constipation Diarrhea Hair loss Loss of appetite Nausea Stomach pain This list may not describe all possible side effects. Call your doctor for medical advice about side effects. You may report side effects to FDA at 1-800-FDA-1088. Where should I keep my medication? This medication is given in a hospital or clinic. It will not be stored at home. NOTE: This sheet is a summary. It may not cover all possible information. If you have questions about this medicine, talk to your doctor, pharmacist, or health care provider.  2024 Elsevier/Gold Standard (2021-12-26 00:00:00)  Leucovorin Injection What is this medication? LEUCOVORIN (loo koe VOR in) prevents side effects from certain medications, such as methotrexate. It works by increasing folate levels. This helps protect healthy cells in your body. It may also be used to treat anemia caused by low levels of folate. It can also be used with fluorouracil, a type of chemotherapy, to treat colorectal cancer. It works by increasing the effects of fluorouracil in the body. This medicine may be used for other purposes; ask your health care provider or pharmacist if you have questions. What should I tell my care team before I take this medication? They need to know if you have any of these conditions: Anemia from low levels of vitamin B12 in the blood An unusual or allergic reaction to leucovorin, folic acid, other medications, foods, dyes, or preservatives Pregnant or trying to get pregnant Breastfeeding How should I use this medication? This medication is injected into a vein or a muscle. It is given by your care team in a hospital or clinic setting. Talk to your care team about the use of this medication in children. Special care may be needed. Overdosage: If you think you have taken too much of this medicine contact a poison control center or emergency room at once. NOTE: This medicine is only for you. Do not  share this medicine with others. What if I miss a dose? Keep appointments for follow-up doses. It is important not to miss your dose. Call your care team if you are unable to keep an appointment. What may interact with this medication? Capecitabine Fluorouracil Phenobarbital Phenytoin Primidone Trimethoprim;sulfamethoxazole This list may not describe all possible interactions. Give your health care provider a list of all the medicines, herbs, non-prescription drugs, or dietary supplements you use. Also tell them if you smoke, drink alcohol, or use illegal drugs. Some items may interact with your  medicine. What should I watch for while using this medication? Your condition will be monitored carefully while you are receiving this medication. This medication may increase the side effects of 5-fluorouracil. Tell your care team if you have diarrhea or mouth sores that do not get better or that get worse. What side effects may I notice from receiving this medication? Side effects that you should report to your care team as soon as possible: Allergic reactions--skin rash, itching, hives, swelling of the face, lips, tongue, or throat This list may not describe all possible side effects. Call your doctor for medical advice about side effects. You may report side effects to FDA at 1-800-FDA-1088. Where should I keep my medication? This medication is given in a hospital or clinic. It will not be stored at home. NOTE: This sheet is a summary. It may not cover all possible information. If you have questions about this medicine, talk to your doctor, pharmacist, or health care provider.  2024 Elsevier/Gold Standard (2022-01-17 00:00:00)  Fluorouracil Injection What is this medication? FLUOROURACIL (flure oh YOOR a sil) treats some types of cancer. It works by slowing down the growth of cancer cells. This medicine may be used for other purposes; ask your health care provider or pharmacist if you have  questions. COMMON BRAND NAME(S): Adrucil What should I tell my care team before I take this medication? They need to know if you have any of these conditions: Blood disorders Dihydropyrimidine dehydrogenase (DPD) deficiency Infection, such as chickenpox, cold sores, herpes Kidney disease Liver disease Poor nutrition Recent or ongoing radiation therapy An unusual or allergic reaction to fluorouracil, other medications, foods, dyes, or preservatives If you or your partner are pregnant or trying to get pregnant Breast-feeding How should I use this medication? This medication is injected into a vein. It is administered by your care team in a hospital or clinic setting. Talk to your care team about the use of this medication in children. Special care may be needed. Overdosage: If you think you have taken too much of this medicine contact a poison control center or emergency room at once. NOTE: This medicine is only for you. Do not share this medicine with others. What if I miss a dose? Keep appointments for follow-up doses. It is important not to miss your dose. Call your care team if you are unable to keep an appointment. What may interact with this medication? Do not take this medication with any of the following: Live virus vaccines This medication may also interact with the following: Medications that treat or prevent blood clots, such as warfarin, enoxaparin, dalteparin This list may not describe all possible interactions. Give your health care provider a list of all the medicines, herbs, non-prescription drugs, or dietary supplements you use. Also tell them if you smoke, drink alcohol, or use illegal drugs. Some items may interact with your medicine. What should I watch for while using this medication? Your condition will be monitored carefully while you are receiving this medication. This medication may make you feel generally unwell. This is not uncommon as chemotherapy can affect  healthy cells as well as cancer cells. Report any side effects. Continue your course of treatment even though you feel ill unless your care team tells you to stop. In some cases, you may be given additional medications to help with side effects. Follow all directions for their use. This medication may increase your risk of getting an infection. Call your care team for advice if you get a  fever, chills, sore throat, or other symptoms of a cold or flu. Do not treat yourself. Try to avoid being around people who are sick. This medication may increase your risk to bruise or bleed. Call your care team if you notice any unusual bleeding. Be careful brushing or flossing your teeth or using a toothpick because you may get an infection or bleed more easily. If you have any dental work done, tell your dentist you are receiving this medication. Avoid taking medications that contain aspirin, acetaminophen, ibuprofen, naproxen, or ketoprofen unless instructed by your care team. These medications may hide a fever. Do not treat diarrhea with over the counter products. Contact your care team if you have diarrhea that lasts more than 2 days or if it is severe and watery. This medication can make you more sensitive to the sun. Keep out of the sun. If you cannot avoid being in the sun, wear protective clothing and sunscreen. Do not use sun lamps, tanning beds, or tanning booths. Talk to your care team if you or your partner wish to become pregnant or think you might be pregnant. This medication can cause serious birth defects if taken during pregnancy and for 3 months after the last dose. A reliable form of contraception is recommended while taking this medication and for 3 months after the last dose. Talk to your care team about effective forms of contraception. Do not father a child while taking this medication and for 3 months after the last dose. Use a condom while having sex during this time period. Do not breastfeed  while taking this medication. This medication may cause infertility. Talk to your care team if you are concerned about your fertility. What side effects may I notice from receiving this medication? Side effects that you should report to your care team as soon as possible: Allergic reactions--skin rash, itching, hives, swelling of the face, lips, tongue, or throat Heart attack--pain or tightness in the chest, shoulders, arms, or jaw, nausea, shortness of breath, cold or clammy skin, feeling faint or lightheaded Heart failure--shortness of breath, swelling of the ankles, feet, or hands, sudden weight gain, unusual weakness or fatigue Heart rhythm changes--fast or irregular heartbeat, dizziness, feeling faint or lightheaded, chest pain, trouble breathing High ammonia level--unusual weakness or fatigue, confusion, loss of appetite, nausea, vomiting, seizures Infection--fever, chills, cough, sore throat, wounds that don't heal, pain or trouble when passing urine, general feeling of discomfort or being unwell Low red blood cell level--unusual weakness or fatigue, dizziness, headache, trouble breathing Pain, tingling, or numbness in the hands or feet, muscle weakness, change in vision, confusion or trouble speaking, loss of balance or coordination, trouble walking, seizures Redness, swelling, and blistering of the skin over hands and feet Severe or prolonged diarrhea Unusual bruising or bleeding Side effects that usually do not require medical attention (report to your care team if they continue or are bothersome): Dry skin Headache Increased tears Nausea Pain, redness, or swelling with sores inside the mouth or throat Sensitivity to light Vomiting This list may not describe all possible side effects. Call your doctor for medical advice about side effects. You may report side effects to FDA at 1-800-FDA-1088. Where should I keep my medication? This medication is given in a hospital or clinic. It  will not be stored at home. NOTE: This sheet is a summary. It may not cover all possible information. If you have questions about this medicine, talk to your doctor, pharmacist, or health care provider.  2024 Elsevier/Gold  Standard (2021-12-20 00:00:00)

## 2023-12-10 NOTE — Progress Notes (Signed)
 Continue Mvasi 500mg .    Jobe Mulder Nixon, [place], BCPS, BCOP 12/10/2023 11:15 AM

## 2023-12-11 ENCOUNTER — Encounter: Payer: Self-pay | Admitting: Oncology

## 2023-12-11 ENCOUNTER — Other Ambulatory Visit: Payer: Self-pay

## 2023-12-12 ENCOUNTER — Inpatient Hospital Stay

## 2023-12-12 VITALS — BP 106/61 | HR 66 | Temp 98.4°F | Resp 16

## 2023-12-12 DIAGNOSIS — G620 Drug-induced polyneuropathy: Secondary | ICD-10-CM | POA: Diagnosis not present

## 2023-12-12 DIAGNOSIS — E785 Hyperlipidemia, unspecified: Secondary | ICD-10-CM | POA: Diagnosis not present

## 2023-12-12 DIAGNOSIS — Z5111 Encounter for antineoplastic chemotherapy: Secondary | ICD-10-CM | POA: Diagnosis not present

## 2023-12-12 DIAGNOSIS — C182 Malignant neoplasm of ascending colon: Secondary | ICD-10-CM | POA: Diagnosis not present

## 2023-12-12 DIAGNOSIS — I10 Essential (primary) hypertension: Secondary | ICD-10-CM | POA: Diagnosis not present

## 2023-12-12 DIAGNOSIS — C189 Malignant neoplasm of colon, unspecified: Secondary | ICD-10-CM

## 2023-12-12 DIAGNOSIS — Z79899 Other long term (current) drug therapy: Secondary | ICD-10-CM | POA: Diagnosis not present

## 2023-12-12 DIAGNOSIS — D509 Iron deficiency anemia, unspecified: Secondary | ICD-10-CM | POA: Diagnosis not present

## 2023-12-12 DIAGNOSIS — K219 Gastro-esophageal reflux disease without esophagitis: Secondary | ICD-10-CM | POA: Diagnosis not present

## 2023-12-12 MED ORDER — HEPARIN SOD (PORK) LOCK FLUSH 100 UNIT/ML IV SOLN
500.0000 [IU] | Freq: Once | INTRAVENOUS | Status: AC | PRN
  Administered 2023-12-12: 500 [IU]

## 2023-12-12 MED ORDER — PEGFILGRASTIM-CBQV 6 MG/0.6ML ~~LOC~~ SOSY
6.0000 mg | PREFILLED_SYRINGE | Freq: Once | SUBCUTANEOUS | Status: AC
  Administered 2023-12-12: 6 mg via SUBCUTANEOUS

## 2023-12-12 MED ORDER — SODIUM CHLORIDE 0.9% FLUSH
10.0000 mL | INTRAVENOUS | Status: DC | PRN
  Administered 2023-12-12: 10 mL

## 2023-12-17 ENCOUNTER — Ambulatory Visit: Admitting: Oncology

## 2023-12-17 ENCOUNTER — Other Ambulatory Visit

## 2023-12-17 ENCOUNTER — Ambulatory Visit

## 2023-12-19 ENCOUNTER — Other Ambulatory Visit (HOSPITAL_BASED_OUTPATIENT_CLINIC_OR_DEPARTMENT_OTHER): Payer: Self-pay

## 2023-12-19 ENCOUNTER — Encounter (HOSPITAL_BASED_OUTPATIENT_CLINIC_OR_DEPARTMENT_OTHER): Payer: Self-pay

## 2023-12-19 ENCOUNTER — Other Ambulatory Visit: Payer: Self-pay | Admitting: Oncology

## 2023-12-21 ENCOUNTER — Other Ambulatory Visit (HOSPITAL_BASED_OUTPATIENT_CLINIC_OR_DEPARTMENT_OTHER): Payer: Self-pay

## 2023-12-21 ENCOUNTER — Encounter: Payer: Self-pay | Admitting: Oncology

## 2023-12-21 MED ORDER — NYSTATIN 100000 UNIT/ML MT SUSP
5.0000 mL | Freq: Four times a day (QID) | OROMUCOSAL | 1 refills | Status: DC | PRN
  Filled 2023-12-21: qty 240, 12d supply, fill #0

## 2023-12-24 ENCOUNTER — Ambulatory Visit

## 2023-12-24 ENCOUNTER — Other Ambulatory Visit (HOSPITAL_BASED_OUTPATIENT_CLINIC_OR_DEPARTMENT_OTHER): Payer: Self-pay

## 2023-12-24 ENCOUNTER — Inpatient Hospital Stay

## 2023-12-24 ENCOUNTER — Ambulatory Visit: Admitting: Oncology

## 2023-12-24 ENCOUNTER — Other Ambulatory Visit

## 2023-12-25 DIAGNOSIS — C182 Malignant neoplasm of ascending colon: Secondary | ICD-10-CM | POA: Diagnosis not present

## 2023-12-26 ENCOUNTER — Encounter: Payer: Self-pay | Admitting: Oncology

## 2023-12-28 ENCOUNTER — Encounter: Payer: Self-pay | Admitting: Oncology

## 2023-12-29 ENCOUNTER — Other Ambulatory Visit: Payer: Self-pay | Admitting: Oncology

## 2023-12-31 ENCOUNTER — Encounter: Payer: Self-pay | Admitting: Oncology

## 2023-12-31 ENCOUNTER — Other Ambulatory Visit: Payer: Self-pay | Admitting: Oncology

## 2023-12-31 ENCOUNTER — Inpatient Hospital Stay (HOSPITAL_BASED_OUTPATIENT_CLINIC_OR_DEPARTMENT_OTHER): Admitting: Oncology

## 2023-12-31 ENCOUNTER — Telehealth: Payer: Self-pay

## 2023-12-31 ENCOUNTER — Inpatient Hospital Stay

## 2023-12-31 ENCOUNTER — Other Ambulatory Visit (HOSPITAL_BASED_OUTPATIENT_CLINIC_OR_DEPARTMENT_OTHER): Payer: Self-pay

## 2023-12-31 ENCOUNTER — Inpatient Hospital Stay: Attending: Oncology

## 2023-12-31 VITALS — BP 126/81 | HR 97 | Temp 98.1°F | Resp 18 | Ht 66.0 in | Wt 247.2 lb

## 2023-12-31 DIAGNOSIS — C189 Malignant neoplasm of colon, unspecified: Secondary | ICD-10-CM

## 2023-12-31 DIAGNOSIS — C182 Malignant neoplasm of ascending colon: Secondary | ICD-10-CM | POA: Insufficient documentation

## 2023-12-31 DIAGNOSIS — D509 Iron deficiency anemia, unspecified: Secondary | ICD-10-CM | POA: Diagnosis not present

## 2023-12-31 DIAGNOSIS — I10 Essential (primary) hypertension: Secondary | ICD-10-CM | POA: Diagnosis not present

## 2023-12-31 DIAGNOSIS — F32A Depression, unspecified: Secondary | ICD-10-CM | POA: Diagnosis not present

## 2023-12-31 DIAGNOSIS — K219 Gastro-esophageal reflux disease without esophagitis: Secondary | ICD-10-CM | POA: Insufficient documentation

## 2023-12-31 DIAGNOSIS — C787 Secondary malignant neoplasm of liver and intrahepatic bile duct: Secondary | ICD-10-CM | POA: Diagnosis not present

## 2023-12-31 DIAGNOSIS — K7460 Unspecified cirrhosis of liver: Secondary | ICD-10-CM | POA: Diagnosis not present

## 2023-12-31 DIAGNOSIS — E785 Hyperlipidemia, unspecified: Secondary | ICD-10-CM | POA: Diagnosis not present

## 2023-12-31 DIAGNOSIS — F410 Panic disorder [episodic paroxysmal anxiety] without agoraphobia: Secondary | ICD-10-CM | POA: Insufficient documentation

## 2023-12-31 DIAGNOSIS — Z5111 Encounter for antineoplastic chemotherapy: Secondary | ICD-10-CM | POA: Insufficient documentation

## 2023-12-31 DIAGNOSIS — Z79899 Other long term (current) drug therapy: Secondary | ICD-10-CM | POA: Diagnosis not present

## 2023-12-31 LAB — CMP (CANCER CENTER ONLY)
ALT: 39 U/L (ref 0–44)
AST: 35 U/L (ref 15–41)
Albumin: 4.3 g/dL (ref 3.5–5.0)
Alkaline Phosphatase: 194 U/L — ABNORMAL HIGH (ref 38–126)
Anion gap: 12 (ref 5–15)
BUN: 14 mg/dL (ref 6–20)
CO2: 25 mmol/L (ref 22–32)
Calcium: 9.5 mg/dL (ref 8.9–10.3)
Chloride: 100 mmol/L (ref 98–111)
Creatinine: 0.84 mg/dL (ref 0.61–1.24)
GFR, Estimated: >60 mL/min (ref >60)
Glucose, Bld: 141 mg/dL — ABNORMAL HIGH (ref 70–99)
Potassium: 3.9 mmol/L (ref 3.5–5.1)
Sodium: 136 mmol/L (ref 135–145)
Total Bilirubin: 0.9 mg/dL (ref 0.0–1.2)
Total Protein: 7.5 g/dL (ref 6.5–8.1)

## 2023-12-31 LAB — TOTAL PROTEIN, URINE DIPSTICK: Protein, ur: NEGATIVE mg/dL

## 2023-12-31 LAB — CBC WITH DIFFERENTIAL (CANCER CENTER ONLY)
Abs Immature Granulocytes: 0.06 10*3/uL (ref 0.00–0.07)
Basophils Absolute: 0.1 10*3/uL (ref 0.0–0.1)
Basophils Relative: 2 %
Eosinophils Absolute: 0.2 10*3/uL (ref 0.0–0.5)
Eosinophils Relative: 2 %
HCT: 34.7 % — ABNORMAL LOW (ref 39.0–52.0)
Hemoglobin: 10.2 g/dL — ABNORMAL LOW (ref 13.0–17.0)
Immature Granulocytes: 1 %
Lymphocytes Relative: 20 %
Lymphs Abs: 1.4 10*3/uL (ref 0.7–4.0)
MCH: 22.4 pg — ABNORMAL LOW (ref 26.0–34.0)
MCHC: 29.4 g/dL — ABNORMAL LOW (ref 30.0–36.0)
MCV: 76.1 fL — ABNORMAL LOW (ref 80.0–100.0)
Monocytes Absolute: 0.5 10*3/uL (ref 0.1–1.0)
Monocytes Relative: 7 %
Neutro Abs: 4.7 10*3/uL (ref 1.7–7.7)
Neutrophils Relative %: 68 %
Platelet Count: 221 10*3/uL (ref 150–400)
RBC: 4.56 MIL/uL (ref 4.22–5.81)
RDW: 19.1 % — ABNORMAL HIGH (ref 11.5–15.5)
WBC Count: 6.8 10*3/uL (ref 4.0–10.5)
nRBC: 0 % (ref 0.0–0.2)

## 2023-12-31 LAB — CEA (ACCESS): CEA (CHCC): 14.92 ng/mL — ABNORMAL HIGH (ref 0.00–5.00)

## 2023-12-31 LAB — FERRITIN: Ferritin: 20 ng/mL — ABNORMAL LOW (ref 24–336)

## 2023-12-31 MED ORDER — SODIUM CHLORIDE 0.9 % IV SOLN
150.0000 mg | Freq: Once | INTRAVENOUS | Status: AC
  Administered 2023-12-31: 150 mg via INTRAVENOUS
  Filled 2023-12-31: qty 150

## 2023-12-31 MED ORDER — NYSTATIN 100000 UNIT/ML MT SUSP
5.0000 mL | Freq: Four times a day (QID) | OROMUCOSAL | 1 refills | Status: DC | PRN
  Filled 2023-12-31: qty 240, 12d supply, fill #0

## 2023-12-31 MED ORDER — PALONOSETRON HCL INJECTION 0.25 MG/5ML
0.2500 mg | Freq: Once | INTRAVENOUS | Status: AC
  Administered 2023-12-31: 0.25 mg via INTRAVENOUS
  Filled 2023-12-31: qty 5

## 2023-12-31 MED ORDER — SODIUM CHLORIDE 0.9 % IV SOLN
1600.0000 mg/m2 | INTRAVENOUS | Status: DC
  Administered 2023-12-31: 3500 mg via INTRAVENOUS
  Filled 2023-12-31: qty 70

## 2023-12-31 MED ORDER — DEXAMETHASONE SODIUM PHOSPHATE 10 MG/ML IJ SOLN
10.0000 mg | Freq: Once | INTRAMUSCULAR | Status: AC
  Administered 2023-12-31: 10 mg via INTRAVENOUS
  Filled 2023-12-31: qty 1

## 2023-12-31 MED ORDER — FLUOROURACIL CHEMO INJECTION 500 MG/10ML
200.0000 mg/m2 | Freq: Once | INTRAVENOUS | Status: AC
Start: 2023-12-31 — End: 2023-12-31
  Administered 2023-12-31: 450 mg via INTRAVENOUS
  Filled 2023-12-31: qty 9

## 2023-12-31 MED ORDER — SODIUM CHLORIDE 0.9 % IV SOLN: Freq: Once | INTRAVENOUS | Status: AC

## 2023-12-31 MED ORDER — ATROPINE SULFATE 1 MG/ML IV SOLN
0.5000 mg | Freq: Once | INTRAVENOUS | Status: AC | PRN
  Administered 2023-12-31: 0.5 mg via INTRAVENOUS
  Filled 2023-12-31: qty 1

## 2023-12-31 MED ORDER — SODIUM CHLORIDE 0.9 % IV SOLN
200.0000 mg/m2 | Freq: Once | INTRAVENOUS | Status: AC
  Administered 2023-12-31: 444 mg via INTRAVENOUS
  Filled 2023-12-31: qty 22.2

## 2023-12-31 MED ORDER — SODIUM CHLORIDE 0.9 % IV SOLN
180.0000 mg/m2 | Freq: Once | INTRAVENOUS | Status: AC
  Administered 2023-12-31: 400 mg via INTRAVENOUS
  Filled 2023-12-31: qty 15

## 2023-12-31 NOTE — Telephone Encounter (Signed)
 Patient gave verbal understanding and had no further questions or concerns

## 2023-12-31 NOTE — Progress Notes (Signed)
 [facility] OFFICE PROGRESS NOTE   Diagnosis: Colon cancer  INTERVAL HISTORY:   Anthony Calhoun completed another cycle of FOLFIRI/bevacizumab  on 12/10/2023.  He reports malaise and nausea for several days following chemotherapy.  He had a posterior left lower tooth extracted on 12/27/2023.  Reports tolerating procedure well.  Tooth pain has resolved.  No new complaint.  Objective:  Vital signs in last 24 hours:  Blood pressure 126/81, pulse 97, temperature 98.1 F (36.7 C), temperature source Temporal, resp. rate 18, height 5\' 6"  (1.676 m), weight 247 lb 3.2 oz (112.1 kg), SpO2 100%.    HEENT: No thrush or ulcers.  The left lower tooth extraction site is without bleeding or evidence of infection. Resp: Lungs clear bilaterally Cardio: Regular rate and rhythm GI: No hepatosplenomegaly no apparent ascites, nontender Vascular: No leg edema  Lab Results:  Lab Results  Component Value Date   WBC 6.8 12/31/2023   HGB 10.2 (L) 12/31/2023   HCT 34.7 (L) 12/31/2023   MCV 76.1 (L) 12/31/2023   PLT 221 12/31/2023   NEUTROABS 4.7 12/31/2023    CMP  Lab Results  Component Value Date   NA 136 12/10/2023   K 4.0 12/10/2023   CL 100 12/10/2023   CO2 27 12/10/2023   GLUCOSE 199 (H) 12/10/2023   BUN 12 12/10/2023   CREATININE 0.77 12/10/2023   CALCIUM  9.1 12/10/2023   PROT 7.2 12/10/2023   ALBUMIN 3.9 12/10/2023   AST 27 12/10/2023   ALT 29 12/10/2023   ALKPHOS 142 (H) 12/10/2023   BILITOT 0.7 12/10/2023   GFRNONAA >60 12/10/2023   GFRAA >60 05/10/2020    Lab Results  Component Value Date   CEA1 1.08 03/07/2021   CEA 14.72 (H) 12/10/2023     Medications: I have reviewed the patient's current medications.   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 secondary to nausea (patient discontinued Xeloda  on day 11 due to colonoscopy) Colonoscopy 07/09/2020-nodular mucosa at the colonic anastomosis, biopsied; patent end-to-side ileocolonic anastomosis characterized by healthy-appearing mucosa and visible sutures; nonbleeding internal hemorrhoids, biopsy with benign ulcerated anastomotic mucosa with granulation tissue Cycle 4 adjuvant Xeloda  07/19/2020, 2000 mg every morning and 1500 mg every afternoon Cycle 5 adjuvant Xeloda  08/09/2020 Cycle 6 adjuvant Xeloda  08/31/2019 Cycle 7 adjuvant Xeloda  09/20/2020 Cycle 8 adjuvant Xeloda  10/11/2020 CTs 03/07/2021-no evidence of recurrent disease, hepatic steatosis, slight wall thickening at the junction of the descending and horizontal duodenum Mild elevation of CEA 2023 12/01/2021-Guardant Reveal-ct DNA detected PET 12/30/2021-hypermetabolic right liver lesion, hypermetabolic area in a loop of mid small bowel with an SUV of 12.5, review of PET images at GI tumor conference 01/11/2022-small bowel uptake felt to be a benign finding MRI liver 01/10/2022-solitary 1.2 cm right liver lesion between segments 7 and 8 compatible with metastasis, subtle changes suggesting cirrhosis, hepatic steatosis CT enteroscopy 02/02/2022-small bowel loop with hypermetabolic activity on PET continues to have a bandlike density along the margin felt to represent a diverticulum or adjacent nodal tissue.  Small bowel tumor not excluded.  3-4 mm left omental nodule 02/08/2022-biopsy and ablation of solitary  liver lesion, adenocarcinoma consistent with metastatic colorectal adenocarcinoma CTs 03/31/2022-unchanged circumstantial soft tissue thickening surrounding a loop of mid  to distal small bowel with an adjacent nodular focus measuring 1.2 cm.,  Ablation cavity in segment 7, no other evidence of metastatic disease Diagnostic laparoscopy 05/26/2022-mid small bowel mass-resected, well to moderately differentiated adenocarcinoma, 0/2 lymph nodes, morphology consistent with a colon primary, tumor involved full-thickness including serosa with perineural and large vessel involvement; foundation 1-microsatellite stable, tumor mutation burden 2, K-ras Q61H CT abdomen/pelvis 07/10/2022-the right liver ablation defect is smaller, new subtle right liver lesion measuring 13 x 9 mm, enlarged lymph node in the right upper quadrant mesentery adjacent to surgical anastomosis PET 07/26/2022-new segment 6 liver lesion, enlarging hypermetabolic left omental lesion, there is another mildly hypermetabolic peritoneal implant, 7 mm omental nodule at the midline without hypermetabolism Cycle 1 FOLFOX 09/25/2022 Cycle 2 FOLFOX/bevacizumab  10/09/2022, 5-FU dose reduced secondary to mucositis Cycle 3 FOLFOX/bevacizumab  10/23/2022 Cycle 4 FOLFOX/bevacizumab  11/06/2022 Cycle 5 FOLFOX/bevacizumab  11/20/2022 Cycle 6 FOLFOX/bevacizumab  12/04/2022 CT abdomen/pelvis 12/14/2022-further contraction of the treated segment 7 lesion, slight enlargement of segment 6 lesion, no new liver lesion, increased size of ileocolic nodes and an omental lesion, stable small retroperitoneal nodes Cycle 7 FOLFOX/bevacizumab  12/18/2022 Cycle 8 FOLFOX/bevacizumab  01/02/2023, oxaliplatin  dose reduced secondary to prolonged nausea and neuropathy symptoms, Emend added Cycle 9 FOLFOX/bevacizumab  01/16/2023, oxaliplatin  discontinued secondary to neuropathy Cycle 1 FOLFIRI 02/19/2023, Avastin  held due to recent dental extractions Cycle 2 FOLFIRI/Avastin  03/05/2023 Cycle 3 FOLFIRI/Avastin  03/19/2023, 5-FU bolus held due to mucositis after cycle 2, Fulphila  added Cycle 4 FOLFIRI/Avastin  04/02/2023, Fulphila , Emend Cycle 5 FOLFIRI/Avastin  04/16/2023,  Fulphila , Emend CTs 05/02/2023-similar ileocolic mesenteric adenopathy and omental metastasis, cirrhosis with portal hypertension, stable liver lesions, no new or progressive disease Cycle 6 FOLFIRI/Avastin  05/07/2023, Fulphila , Emend Cycle 7 FOLFIRI/Avastin  05/21/2023, Fulphila , Emend Cycle 8 FOLFIRI 06/04/2023, Fulphila , Emend; Avastin  held Cycle 9 FOLFIRI 06/18/2023, Fulphila , Emend, Avastin  held Treatment held 07/09/2023 patient request Cycle 10 FOLFIRI 07/18/2023, Fulphila , Emend, Avastin  held CTs 07/31/2023: Stable with unchanged hepatic lesions, mesenteric lymph nodes, and left omental nodule, unchanged morphologic changes of cirrhosis and portal hypertension Cycle 11 FOLFIRI/Avastin  08/01/2023, Fulphila  Chemotherapy held 08/15/2023 per patient request Cycle 12 FOLFIRI/Avastin  08/30/2023, Udenyca  Cycle 13 FOLFIRI/Avastin  09/19/2023, Udenyca  Cycle 14 FOLFIRI/Avastin  10/01/2023, Udenyca  Cycle 15 FOLFIRI/Avastin  10/22/2023, Udenyca  11/05/2023 treatment held per patient request due to dental issue Cycle 16 FOLFIRI 11/19/2023, Udenyca , Avastin  held secondary to planned dental extractions CTs 11/28/2023: No evidence of progressive disease, stable hepatic, left upper quadrant omental, and ileocolic mesenteric lesions Cycle 17 FOLFIRI/Avastin  12/10/2023 Cycle 18 FOLFIRI 12/31/2023, Avastin  held secondary to 12/27/2023 tooth extraction Microcytic anemia secondary to #1-improved Colon polyps-sessile serrated adenoma of the descending colon, tubular adenomas with focal high-grade dysplasia of the transverse colon on colonoscopy 02/12/2020, tubular adenoma of the mid ascending colon on the surgical specimen 04/12/2020 Family history of colon cancer Depression Anxiety/panic attacks Hypertension Hyperlipidemia Nausea secondary to Xeloda ?-Resolved Gastroesophageal reflux disease-improved following discontinuation of Xeloda  Anemia, microcytic; ferritin 6 01/24/2022; stool positive for blood x3 01/25/2022; 02/02/2022 CT  abdomen/pelvis enterography-bowel loop demonstrating hypermetabolic activity continues to have a bandlike density along its margin possibly representing a diverticulum or adjacent nodal tissue, difficult to exclude small bowel tumor, 3 x 4 mm left omental nodule or lymph node, known right hepatic lobe tumor is occult on CT. Venofer  weekly x3 beginning 03/03/2022 Negative capsule endoscopy 03/09/2022 Venofer  03/03/2022 for 3 weekly doses Venofer  05/03/2022, 05/19/2022 Venofer  07/28/2022, 08/04/2022 Venofer  03/22/2023, 03/29/2023, 04/04/2023 Venofer  06/18/2023, 07/09/2023 07/03/2023 upper endoscopy and colonoscopy-severe  portal hypertensive gastropathy in the gastric fundus, gastric body, stomach, red blood found greater curvature of the stomach and gastric antrum, treated with APC; colonoscopy showed dark old maroon-colored blood in the entire colon.  No evidence of active or recent bleeding in the colon. 12.  Anterior left neck nodule 03/17/2022-cyst?,  Lymph node? 13.  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  Oxaliplatin  neuropathy-mild loss of vibratory sense 12/04/2022, 12/18/2022, 01/02/2023      Disposition: Anthony Calhoun appears stable.  He will complete another cycle of FOLFIRI today.  Avastin  will be held secondary to the recent tooth extraction.  He will return for an office visit and chemotherapy in 3 weeks.  Coni Deep, MD  12/31/2023  9:41 AM

## 2023-12-31 NOTE — Patient Instructions (Signed)
 [facility] - A DEPT OF [facility]  Discharge Instructions: Thank you for choosing [facility] to provide your oncology and hematology care.   If you have a lab appointment with the Cancer Center, please go directly to the Cancer Center and check in at the registration area.   Wear comfortable clothing and clothing appropriate for easy access to any Portacath or PICC line.   We strive to give you quality time with your provider. You may need to reschedule your appointment if you arrive late (15 or more minutes).  Arriving late affects you and other patients whose appointments are after yours.  Also, if you miss three or more appointments without notifying the office, you may be dismissed from the clinic at the provider's discretion.      For prescription refill requests, have your pharmacy contact our office and allow 72 hours for refills to be completed.    Today you received the following chemotherapy and/or immunotherapy agents :  Irinotecan,  Leucovorin,  Fluorouracil.   To help prevent nausea and vomiting after your treatment, we encourage you to take your nausea medication as directed.  BELOW ARE SYMPTOMS THAT SHOULD BE REPORTED IMMEDIATELY: *FEVER GREATER THAN 100.4 F (38 C) OR HIGHER *CHILLS OR SWEATING *NAUSEA AND VOMITING THAT IS NOT CONTROLLED WITH YOUR NAUSEA MEDICATION *UNUSUAL SHORTNESS OF BREATH *UNUSUAL BRUISING OR BLEEDING *URINARY PROBLEMS (pain or burning when urinating, or frequent urination) *BOWEL PROBLEMS (unusual diarrhea, constipation, pain near the anus) TENDERNESS IN MOUTH AND THROAT WITH OR WITHOUT PRESENCE OF ULCERS (sore throat, sores in mouth, or a toothache) UNUSUAL RASH, SWELLING OR PAIN  UNUSUAL VAGINAL DISCHARGE OR ITCHING   Items with * indicate a potential emergency and should be followed up as soon as possible or go to the Emergency Department if any problems should occur.  Please show the CHEMOTHERAPY  ALERT CARD or IMMUNOTHERAPY ALERT CARD at check-in to the Emergency Department and triage nurse.  Should you have questions after your visit or need to cancel or reschedule your appointment, please contact [facility] - A DEPT OF [facility]  Dept: [phone]  and follow the prompts.  Office hours are 8:00 a.m. to 4:30 p.m. Monday - Friday. Please note that voicemails left after 4:00 p.m. may not be returned until the following business day.  We are closed weekends and major holidays. You have access to a nurse at all times for urgent questions. Please call the main number to the clinic Dept: [phone] and follow the prompts.   For any non-urgent questions, you may also contact your provider using MyChart. We now offer e-Visits for anyone 52 and older to request care online for non-urgent symptoms. For details visit [email or web address].   Also download the MyChart app! Go to the app store, search "MyChart", open the app, select [facility], and log in with your MyChart username and password.

## 2023-12-31 NOTE — Progress Notes (Signed)
 Patient seen by Dr. Coni Deep today  Vitals are within treatment parameters:Yes   Labs are within treatment parameters: Yes   Treatment plan has been signed: Yes   Per physician team, Patient is ready for treatment. Please note the following modifications: Holding Avastin  due to dental extraction on 12/28/23

## 2023-12-31 NOTE — Telephone Encounter (Signed)
-----   Message from Coni Deep sent at 12/31/2023  1:42 PM EDT ----- Please call patient, the ferritin level is low, CEA is stable, schedule Venofer  on 01/02/2024 and when he returns in 3 weeks

## 2024-01-01 ENCOUNTER — Other Ambulatory Visit (HOSPITAL_BASED_OUTPATIENT_CLINIC_OR_DEPARTMENT_OTHER): Payer: Self-pay

## 2024-01-02 ENCOUNTER — Other Ambulatory Visit: Payer: Self-pay

## 2024-01-02 ENCOUNTER — Inpatient Hospital Stay

## 2024-01-02 ENCOUNTER — Other Ambulatory Visit (HOSPITAL_BASED_OUTPATIENT_CLINIC_OR_DEPARTMENT_OTHER): Payer: Self-pay

## 2024-01-02 VITALS — BP 119/65 | HR 79 | Temp 98.6°F | Resp 16

## 2024-01-02 DIAGNOSIS — Z79899 Other long term (current) drug therapy: Secondary | ICD-10-CM | POA: Diagnosis not present

## 2024-01-02 DIAGNOSIS — E785 Hyperlipidemia, unspecified: Secondary | ICD-10-CM | POA: Diagnosis not present

## 2024-01-02 DIAGNOSIS — C182 Malignant neoplasm of ascending colon: Secondary | ICD-10-CM | POA: Diagnosis not present

## 2024-01-02 DIAGNOSIS — D509 Iron deficiency anemia, unspecified: Secondary | ICD-10-CM

## 2024-01-02 DIAGNOSIS — I10 Essential (primary) hypertension: Secondary | ICD-10-CM | POA: Diagnosis not present

## 2024-01-02 DIAGNOSIS — Z5111 Encounter for antineoplastic chemotherapy: Secondary | ICD-10-CM | POA: Diagnosis not present

## 2024-01-02 DIAGNOSIS — K7460 Unspecified cirrhosis of liver: Secondary | ICD-10-CM | POA: Diagnosis not present

## 2024-01-02 DIAGNOSIS — C189 Malignant neoplasm of colon, unspecified: Secondary | ICD-10-CM

## 2024-01-02 DIAGNOSIS — K219 Gastro-esophageal reflux disease without esophagitis: Secondary | ICD-10-CM | POA: Diagnosis not present

## 2024-01-02 MED ORDER — SODIUM CHLORIDE 0.9 % IV SOLN
300.0000 mg | Freq: Once | INTRAVENOUS | Status: AC
  Administered 2024-01-02: 300.0065 mg via INTRAVENOUS
  Filled 2024-01-02: qty 300

## 2024-01-02 MED ORDER — HEPARIN SOD (PORK) LOCK FLUSH 100 UNIT/ML IV SOLN
500.0000 [IU] | Freq: Once | INTRAVENOUS | Status: AC | PRN
Start: 2024-01-02 — End: 2024-01-02
  Administered 2024-01-02: 500 [IU]

## 2024-01-02 MED ORDER — SODIUM CHLORIDE 0.9 % IV SOLN
INTRAVENOUS | Status: DC
Start: 2024-01-02 — End: 2024-01-02

## 2024-01-02 MED ORDER — SODIUM CHLORIDE 0.9% FLUSH
10.0000 mL | INTRAVENOUS | Status: DC | PRN
  Administered 2024-01-02: 10 mL

## 2024-01-02 MED ORDER — PEGFILGRASTIM-CBQV 6 MG/0.6ML ~~LOC~~ SOSY
6.0000 mg | PREFILLED_SYRINGE | Freq: Once | SUBCUTANEOUS | Status: AC
  Administered 2024-01-02: 6 mg via SUBCUTANEOUS
  Filled 2024-01-02: qty 0.6

## 2024-01-02 NOTE — Patient Instructions (Signed)
 Iron  Sucrose Injection What is this medication? IRON  SUCROSE (EYE ern SOO krose) treats low levels of iron  (iron  deficiency anemia) in people with kidney disease. Iron  is a mineral that plays an important role in making red blood cells, which carry oxygen from your lungs to the rest of your body. This medicine may be used for other purposes; ask your health care provider or pharmacist if you have questions. COMMON BRAND NAME(S): Venofer  What should I tell my care team before I take this medication? They need to know if you have any of these conditions: Anemia not caused by low iron  levels Heart disease High levels of iron  in the blood Kidney disease Liver disease An unusual or allergic reaction to iron , other medications, foods, dyes, or preservatives Pregnant or trying to get pregnant Breastfeeding How should I use this medication? This medication is infused into a vein. It is given by your care team in a hospital or clinic setting. Talk to your care team about the use of this medication in children. While it may be prescribed for children as young as 2 years for selected conditions, precautions do apply. Overdosage: If you think you have taken too much of this medicine contact a poison control center or emergency room at once. NOTE: This medicine is only for you. Do not share this medicine with others. What if I miss a dose? Keep appointments for follow-up doses. It is important not to miss your dose. Call your care team if you are unable to keep an appointment. What may interact with this medication? Do not take this medication with any of the following: Deferoxamine Dimercaprol Other iron  products This medication may also interact with the following: Chloramphenicol Deferasirox This list may not describe all possible interactions. Give your health care provider a list of all the medicines, herbs, non-prescription drugs, or dietary supplements you use. Also tell them if you smoke,  drink alcohol, or use illegal drugs. Some items may interact with your medicine. What should I watch for while using this medication? Visit your care team for regular checks on your progress. Tell your care team if your symptoms do not start to get better or if they get worse. You may need blood work done while you are taking this medication. You may need to eat more foods that contain iron . Talk to your care team. Foods that contain iron  include whole grains or cereals, dried fruits, beans, peas, leafy green vegetables, and organ meats (liver, kidney). What side effects may I notice from receiving this medication? Side effects that you should report to your care team as soon as possible: Allergic reactions--skin rash, itching, hives, swelling of the face, lips, tongue, or throat Low blood pressure--dizziness, feeling faint or lightheaded, blurry vision Shortness of breath Side effects that usually do not require medical attention (report to your care team if they continue or are bothersome): Flushing Headache Joint pain Muscle pain Nausea Pain, redness, or irritation at injection site This list may not describe all possible side effects. Call your doctor for medical advice about side effects. You may report side effects to FDA at 1-800-FDA-1088. Where should I keep my medication? This medication is given in a hospital or clinic. It will not be stored at home. NOTE: This sheet is a summary. It may not cover all possible information. If you have questions about this medicine, talk to your doctor, pharmacist, or health care provider.  2024 Elsevier/Gold Standard (2023-04-04 00:00:00)  Pegfilgrastim  Injection What is this medication? PEGFILGRASTIM  (  PEG fil gra stim) lowers the risk of infection in people who are receiving chemotherapy. It works by Systems analyst make more white blood cells, which protects your body from infection. It may also be used to help people who have been exposed to  high doses of radiation. This medicine may be used for other purposes; ask your health care provider or pharmacist if you have questions. COMMON BRAND NAME(S): Fulphila , Fylnetra , Neulasta , Nyvepria , Stimufend , UDENYCA , UDENYCA  ONBODY, Ziextenzo  What should I tell my care team before I take this medication? They need to know if you have any of these conditions: Kidney disease Latex allergy Ongoing radiation therapy Sickle cell disease Skin reactions to acrylic adhesives (On-Body Injector only) An unusual or allergic reaction to pegfilgrastim , filgrastim, other medications, foods, dyes, or preservatives Pregnant or trying to get pregnant Breast-feeding How should I use this medication? This medication is for injection under the skin. If you get this medication at home, you will be taught how to prepare and give the pre-filled syringe or how to use the On-body Injector. Refer to the patient Instructions for Use for detailed instructions. Use exactly as directed. Tell your care team immediately if you suspect that the On-body Injector may not have performed as intended or if you suspect the use of the On-body Injector resulted in a missed or partial dose. It is important that you put your used needles and syringes in a special sharps container. Do not put them in a trash can. If you do not have a sharps container, call your pharmacist or care team to get one. Talk to your care team about the use of this medication in children. While this medication may be prescribed for selected conditions, precautions do apply. Overdosage: If you think you have taken too much of this medicine contact a poison control center or emergency room at once. NOTE: This medicine is only for you. Do not share this medicine with others. What if I miss a dose? It is important not to miss your dose. Call your care team if you miss your dose. If you miss a dose due to an On-body Injector failure or leakage, a new dose should be  administered as soon as possible using a single prefilled syringe for manual use. What may interact with this medication? Interactions have not been studied. This list may not describe all possible interactions. Give your health care provider a list of all the medicines, herbs, non-prescription drugs, or dietary supplements you use. Also tell them if you smoke, drink alcohol, or use illegal drugs. Some items may interact with your medicine. What should I watch for while using this medication? Your condition will be monitored carefully while you are receiving this medication. You may need blood work done while you are taking this medication. Talk to your care team about your risk of cancer. You may be more at risk for certain types of cancer if you take this medication. If you are going to need a MRI, CT scan, or other procedure, tell your care team that you are using this medication (On-Body Injector only). What side effects may I notice from receiving this medication? Side effects that you should report to your care team as soon as possible: Allergic reactions--skin rash, itching, hives, swelling of the face, lips, tongue, or throat Capillary leak syndrome--stomach or muscle pain, unusual weakness or fatigue, feeling faint or lightheaded, decrease in the amount of urine, swelling of the ankles, hands, or feet, trouble breathing High white blood  cell level--fever, fatigue, trouble breathing, night sweats, change in vision, weight loss Inflammation of the aorta--fever, fatigue, back, chest, or stomach pain, severe headache Kidney injury (glomerulonephritis)--decrease in the amount of urine, red or dark brown urine, foamy or bubbly urine, swelling of the ankles, hands, or feet Shortness of breath or trouble breathing Spleen injury--pain in upper left stomach or shoulder Unusual bruising or bleeding Side effects that usually do not require medical attention (report to your care team if they continue  or are bothersome): Bone pain Pain in the hands or feet This list may not describe all possible side effects. Call your doctor for medical advice about side effects. You may report side effects to FDA at 1-800-FDA-1088. Where should I keep my medication? Keep out of the reach of children. If you are using this medication at home, you will be instructed on how to store it. Throw away any unused medication after the expiration date on the label. NOTE: This sheet is a summary. It may not cover all possible information. If you have questions about this medicine, talk to your doctor, pharmacist, or health care provider.  2024 Elsevier/Gold Standard (2021-07-15 00:00:00)

## 2024-01-03 ENCOUNTER — Other Ambulatory Visit: Payer: Self-pay

## 2024-01-04 ENCOUNTER — Encounter: Payer: Self-pay | Admitting: Oncology

## 2024-01-05 ENCOUNTER — Other Ambulatory Visit: Payer: Self-pay

## 2024-01-07 ENCOUNTER — Encounter: Payer: Self-pay | Admitting: Oncology

## 2024-01-10 ENCOUNTER — Encounter: Payer: Self-pay | Admitting: Oncology

## 2024-01-14 ENCOUNTER — Encounter: Payer: Self-pay | Admitting: Oncology

## 2024-01-14 DIAGNOSIS — M545 Low back pain, unspecified: Secondary | ICD-10-CM | POA: Diagnosis not present

## 2024-01-16 ENCOUNTER — Other Ambulatory Visit: Payer: Self-pay | Admitting: Oncology

## 2024-01-18 MED FILL — Fluorouracil IV Soln 5 GM/100ML (50 MG/ML): INTRAVENOUS | Qty: 70 | Status: AC

## 2024-01-18 MED FILL — Fluorouracil IV Soln 500 MG/10ML (50 MG/ML): INTRAVENOUS | Qty: 9 | Status: AC

## 2024-01-22 ENCOUNTER — Inpatient Hospital Stay

## 2024-01-22 ENCOUNTER — Other Ambulatory Visit: Payer: Self-pay | Admitting: *Deleted

## 2024-01-22 ENCOUNTER — Encounter: Payer: Self-pay | Admitting: Oncology

## 2024-01-22 ENCOUNTER — Encounter: Payer: Self-pay | Admitting: *Deleted

## 2024-01-22 ENCOUNTER — Other Ambulatory Visit (HOSPITAL_BASED_OUTPATIENT_CLINIC_OR_DEPARTMENT_OTHER): Payer: Self-pay

## 2024-01-22 ENCOUNTER — Inpatient Hospital Stay (HOSPITAL_BASED_OUTPATIENT_CLINIC_OR_DEPARTMENT_OTHER): Admitting: Oncology

## 2024-01-22 VITALS — BP 119/72 | HR 97 | Temp 98.2°F | Resp 18 | Ht 66.0 in | Wt 250.4 lb

## 2024-01-22 DIAGNOSIS — C787 Secondary malignant neoplasm of liver and intrahepatic bile duct: Secondary | ICD-10-CM | POA: Diagnosis not present

## 2024-01-22 DIAGNOSIS — E785 Hyperlipidemia, unspecified: Secondary | ICD-10-CM | POA: Diagnosis not present

## 2024-01-22 DIAGNOSIS — I10 Essential (primary) hypertension: Secondary | ICD-10-CM | POA: Diagnosis not present

## 2024-01-22 DIAGNOSIS — C182 Malignant neoplasm of ascending colon: Secondary | ICD-10-CM | POA: Diagnosis not present

## 2024-01-22 DIAGNOSIS — Z95828 Presence of other vascular implants and grafts: Secondary | ICD-10-CM

## 2024-01-22 DIAGNOSIS — C189 Malignant neoplasm of colon, unspecified: Secondary | ICD-10-CM

## 2024-01-22 DIAGNOSIS — K7460 Unspecified cirrhosis of liver: Secondary | ICD-10-CM | POA: Diagnosis not present

## 2024-01-22 DIAGNOSIS — Z79899 Other long term (current) drug therapy: Secondary | ICD-10-CM | POA: Diagnosis not present

## 2024-01-22 DIAGNOSIS — K219 Gastro-esophageal reflux disease without esophagitis: Secondary | ICD-10-CM | POA: Diagnosis not present

## 2024-01-22 DIAGNOSIS — D509 Iron deficiency anemia, unspecified: Secondary | ICD-10-CM | POA: Diagnosis not present

## 2024-01-22 DIAGNOSIS — Z5111 Encounter for antineoplastic chemotherapy: Secondary | ICD-10-CM | POA: Diagnosis not present

## 2024-01-22 LAB — CMP (CANCER CENTER ONLY)
ALT: 43 U/L (ref 0–44)
AST: 39 U/L (ref 15–41)
Albumin: 4 g/dL (ref 3.5–5.0)
Alkaline Phosphatase: 186 U/L — ABNORMAL HIGH (ref 38–126)
Anion gap: 12 (ref 5–15)
BUN: 14 mg/dL (ref 6–20)
CO2: 24 mmol/L (ref 22–32)
Calcium: 9.1 mg/dL (ref 8.9–10.3)
Chloride: 96 mmol/L — ABNORMAL LOW (ref 98–111)
Creatinine: 0.82 mg/dL (ref 0.61–1.24)
GFR, Estimated: >60 mL/min (ref >60)
Glucose, Bld: 368 mg/dL — ABNORMAL HIGH (ref 70–99)
Potassium: 3.9 mmol/L (ref 3.5–5.1)
Sodium: 132 mmol/L — ABNORMAL LOW (ref 135–145)
Total Bilirubin: 0.7 mg/dL (ref 0.0–1.2)
Total Protein: 7.2 g/dL (ref 6.5–8.1)

## 2024-01-22 LAB — URINALYSIS, COMPLETE (UACMP) WITH MICROSCOPIC
Bacteria, UA: NONE SEEN
Bilirubin Urine: NEGATIVE
Glucose, UA: >1000 mg/dL — AB
Hgb urine dipstick: NEGATIVE
Ketones, ur: NEGATIVE mg/dL
Leukocytes,Ua: NEGATIVE
Nitrite: NEGATIVE
Protein, ur: NEGATIVE mg/dL
Specific Gravity, Urine: 1.034 — ABNORMAL HIGH (ref 1.005–1.030)
pH: 6 (ref 5.0–8.0)

## 2024-01-22 LAB — CBC WITH DIFFERENTIAL (CANCER CENTER ONLY)
Abs Immature Granulocytes: 0.04 10*3/uL (ref 0.00–0.07)
Basophils Absolute: 0.1 10*3/uL (ref 0.0–0.1)
Basophils Relative: 1 %
Eosinophils Absolute: 0.1 10*3/uL (ref 0.0–0.5)
Eosinophils Relative: 2 %
HCT: 32.1 % — ABNORMAL LOW (ref 39.0–52.0)
Hemoglobin: 9.4 g/dL — ABNORMAL LOW (ref 13.0–17.0)
Immature Granulocytes: 1 %
Lymphocytes Relative: 13 %
Lymphs Abs: 0.8 10*3/uL (ref 0.7–4.0)
MCH: 23.1 pg — ABNORMAL LOW (ref 26.0–34.0)
MCHC: 29.3 g/dL — ABNORMAL LOW (ref 30.0–36.0)
MCV: 78.9 fL — ABNORMAL LOW (ref 80.0–100.0)
Monocytes Absolute: 0.4 10*3/uL (ref 0.1–1.0)
Monocytes Relative: 7 %
Neutro Abs: 4.5 10*3/uL (ref 1.7–7.7)
Neutrophils Relative %: 76 %
Platelet Count: 189 10*3/uL (ref 150–400)
RBC: 4.07 MIL/uL — ABNORMAL LOW (ref 4.22–5.81)
RDW: 21.2 % — ABNORMAL HIGH (ref 11.5–15.5)
WBC Count: 5.9 10*3/uL (ref 4.0–10.5)
nRBC: 0 % (ref 0.0–0.2)

## 2024-01-22 LAB — TOTAL PROTEIN, URINE DIPSTICK: Protein, ur: NEGATIVE mg/dL

## 2024-01-22 LAB — CEA (ACCESS): CEA (CHCC): 22.06 ng/mL — ABNORMAL HIGH (ref 0.00–5.00)

## 2024-01-22 MED ORDER — SODIUM CHLORIDE 0.9 % IV SOLN
200.0000 mg/m2 | Freq: Once | INTRAVENOUS | Status: AC
  Administered 2024-01-22: 444 mg via INTRAVENOUS
  Filled 2024-01-22: qty 22.2

## 2024-01-22 MED ORDER — NYSTATIN 100000 UNIT/ML MT SUSP
5.0000 mL | Freq: Four times a day (QID) | OROMUCOSAL | 1 refills | Status: DC
  Filled 2024-01-22: qty 240, 12d supply, fill #0
  Filled 2024-02-22: qty 240, 12d supply, fill #1

## 2024-01-22 MED ORDER — SODIUM CHLORIDE 0.9 % IV SOLN
150.0000 mg | Freq: Once | INTRAVENOUS | Status: AC
  Administered 2024-01-22: 150 mg via INTRAVENOUS
  Filled 2024-01-22: qty 5

## 2024-01-22 MED ORDER — SODIUM CHLORIDE 0.9 % IV SOLN
1600.0000 mg/m2 | INTRAVENOUS | Status: DC
  Administered 2024-01-22: 3500 mg via INTRAVENOUS
  Filled 2024-01-22: qty 70

## 2024-01-22 MED ORDER — BEVACIZUMAB-AWWB CHEMO INJECTION 400 MG/16ML
5.0000 mg/kg | Freq: Once | INTRAVENOUS | Status: AC
  Administered 2024-01-22: 600 mg via INTRAVENOUS
  Filled 2024-01-22: qty 16

## 2024-01-22 MED ORDER — DEXAMETHASONE SODIUM PHOSPHATE 10 MG/ML IJ SOLN
10.0000 mg | Freq: Once | INTRAMUSCULAR | Status: AC
  Administered 2024-01-22: 10 mg via INTRAVENOUS
  Filled 2024-01-22: qty 1

## 2024-01-22 MED ORDER — FLUOROURACIL CHEMO INJECTION 500 MG/10ML
200.0000 mg/m2 | Freq: Once | INTRAVENOUS | Status: AC
  Administered 2024-01-22: 450 mg via INTRAVENOUS
  Filled 2024-01-22: qty 9

## 2024-01-22 MED ORDER — SODIUM CHLORIDE 0.9 % IV SOLN: Freq: Once | INTRAVENOUS | Status: AC

## 2024-01-22 MED ORDER — IRINOTECAN HCL CHEMO INJECTION 100 MG/5ML
180.0000 mg/m2 | Freq: Once | INTRAVENOUS | Status: AC
  Administered 2024-01-22: 400 mg via INTRAVENOUS
  Filled 2024-01-22: qty 20

## 2024-01-22 MED ORDER — SODIUM CHLORIDE 0.9% FLUSH
10.0000 mL | INTRAVENOUS | Status: DC | PRN
  Administered 2024-01-22: 10 mL via INTRAVENOUS

## 2024-01-22 MED ORDER — PALONOSETRON HCL INJECTION 0.25 MG/5ML
0.2500 mg | Freq: Once | INTRAVENOUS | Status: AC
  Administered 2024-01-22: 0.25 mg via INTRAVENOUS
  Filled 2024-01-22: qty 5

## 2024-01-22 MED ORDER — NYSTATIN 100000 UNIT/ML MT SUSP: 5.0000 mL | Freq: Four times a day (QID) | OROMUCOSAL | 1 refills | Status: DC | PRN

## 2024-01-22 MED ORDER — ATROPINE SULFATE 1 MG/ML IV SOLN
0.5000 mg | Freq: Once | INTRAVENOUS | Status: AC | PRN
  Administered 2024-01-22: 0.5 mg via INTRAVENOUS
  Filled 2024-01-22: qty 1

## 2024-01-22 NOTE — Progress Notes (Signed)
 Patient seen by Dr. Coni Deep today  Vitals are within treatment parameters:Yes   Labs are within treatment parameters: Yes No intervention needed for glucose of 368--will forward result to PCP  Treatment plan has been signed: Yes   Per physician team, Patient is ready for treatment and there are NO modifications to the treatment plan.

## 2024-01-22 NOTE — Progress Notes (Signed)
 [facility] OFFICE PROGRESS NOTE   Diagnosis: Colon cancer  INTERVAL HISTORY:   Mr. Toral completed another cycle of FOLFIRI on 12/31/2023.  He reports nausea lasting for approximately 1 week following chemotherapy.  Phenergan  helps partially.  No mouth sores.  He reports low back pain for the past month.  The pain is now improved.  The pain radiates to the low abdomen.  He complains of increased oral secretions.  Objective:  Vital signs in last 24 hours:  Blood pressure 119/72, pulse 97, temperature 98.2 F (36.8 C), temperature source Temporal, resp. rate 18, height 5\' 6"  (1.676 m), weight 250 lb 6.4 oz (113.6 kg), SpO2 98%.    HEENT: No thrush or ulcers, the mouth is dry Resp: Lungs clear bilaterally Cardio: Regular rate and rhythm GI: No hepatosplenomegaly, no mass Vascular: No leg edema   Portacath/PICC-without erythema  Lab Results:  Lab Results  Component Value Date   WBC 5.9 01/22/2024   HGB 9.4 (L) 01/22/2024   HCT 32.1 (L) 01/22/2024   MCV 78.9 (L) 01/22/2024   PLT 189 01/22/2024   NEUTROABS 4.5 01/22/2024    CMP  Lab Results  Component Value Date   NA 136 12/31/2023   K 3.9 12/31/2023   CL 100 12/31/2023   CO2 25 12/31/2023   GLUCOSE 141 (H) 12/31/2023   BUN 14 12/31/2023   CREATININE 0.84 12/31/2023   CALCIUM  9.5 12/31/2023   PROT 7.5 12/31/2023   ALBUMIN 4.3 12/31/2023   AST 35 12/31/2023   ALT 39 12/31/2023   ALKPHOS 194 (H) 12/31/2023   BILITOT 0.9 12/31/2023   GFRNONAA >60 12/31/2023   GFRAA >60 05/10/2020    Lab Results  Component Value Date   CEA1 1.08 03/07/2021   CEA 14.92 (H) 12/31/2023   Medications: I have reviewed the patient's current medications.   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 secondary to nausea (patient discontinued Xeloda  on day 11 due to colonoscopy) Colonoscopy 07/09/2020-nodular mucosa at the colonic anastomosis, biopsied; patent end-to-side ileocolonic anastomosis characterized by healthy-appearing mucosa and visible sutures; nonbleeding internal hemorrhoids, biopsy with benign ulcerated anastomotic mucosa with granulation tissue Cycle 4 adjuvant Xeloda  07/19/2020, 2000 mg every morning and 1500 mg every afternoon Cycle 5 adjuvant Xeloda  08/09/2020 Cycle 6 adjuvant Xeloda  08/31/2019 Cycle 7 adjuvant Xeloda  09/20/2020 Cycle 8 adjuvant Xeloda  10/11/2020 CTs 03/07/2021-no evidence of recurrent disease, hepatic steatosis, slight wall thickening at the junction of the descending and horizontal duodenum Mild elevation of CEA 2023 12/01/2021-Guardant Reveal-ct DNA detected PET 12/30/2021-hypermetabolic right liver lesion, hypermetabolic area in a loop of mid small bowel with an SUV of 12.5, review of PET images at GI tumor conference 01/11/2022-small bowel uptake felt to be a benign finding MRI liver 01/10/2022-solitary 1.2 cm right liver lesion between segments 7 and 8 compatible with metastasis, subtle changes suggesting cirrhosis, hepatic steatosis CT enteroscopy 02/02/2022-small bowel loop with hypermetabolic activity on PET continues to have a bandlike density along the margin felt to represent a diverticulum or adjacent nodal tissue.  Small bowel tumor not excluded.  3-4 mm left omental nodule 02/08/2022-biopsy  and ablation of solitary liver lesion, adenocarcinoma consistent with metastatic colorectal adenocarcinoma CTs 03/31/2022-unchanged circumstantial soft tissue thickening surrounding a loop of mid to distal  small bowel with an adjacent nodular focus measuring 1.2 cm.,  Ablation cavity in segment 7, no other evidence of metastatic disease Diagnostic laparoscopy 05/26/2022-mid small bowel mass-resected, well to moderately differentiated adenocarcinoma, 0/2 lymph nodes, morphology consistent with a colon primary, tumor involved full-thickness including serosa with perineural and large vessel involvement; foundation 1-microsatellite stable, tumor mutation burden 2, K-ras Q61H CT abdomen/pelvis 07/10/2022-the right liver ablation defect is smaller, new subtle right liver lesion measuring 13 x 9 mm, enlarged lymph node in the right upper quadrant mesentery adjacent to surgical anastomosis PET 07/26/2022-new segment 6 liver lesion, enlarging hypermetabolic left omental lesion, there is another mildly hypermetabolic peritoneal implant, 7 mm omental nodule at the midline without hypermetabolism Cycle 1 FOLFOX 09/25/2022 Cycle 2 FOLFOX/bevacizumab  10/09/2022, 5-FU dose reduced secondary to mucositis Cycle 3 FOLFOX/bevacizumab  10/23/2022 Cycle 4 FOLFOX/bevacizumab  11/06/2022 Cycle 5 FOLFOX/bevacizumab  11/20/2022 Cycle 6 FOLFOX/bevacizumab  12/04/2022 CT abdomen/pelvis 12/14/2022-further contraction of the treated segment 7 lesion, slight enlargement of segment 6 lesion, no new liver lesion, increased size of ileocolic nodes and an omental lesion, stable small retroperitoneal nodes Cycle 7 FOLFOX/bevacizumab  12/18/2022 Cycle 8 FOLFOX/bevacizumab  01/02/2023, oxaliplatin  dose reduced secondary to prolonged nausea and neuropathy symptoms, Emend added Cycle 9 FOLFOX/bevacizumab  01/16/2023, oxaliplatin  discontinued secondary to neuropathy Cycle 1 FOLFIRI 02/19/2023, Avastin  held due to recent dental extractions Cycle 2 FOLFIRI/Avastin  03/05/2023 Cycle 3 FOLFIRI/Avastin  03/19/2023, 5-FU bolus held due to mucositis after cycle 2, Fulphila  added Cycle 4 FOLFIRI/Avastin  04/02/2023, Fulphila , Emend Cycle 5 FOLFIRI/Avastin  04/16/2023, Fulphila ,  Emend CTs 05/02/2023-similar ileocolic mesenteric adenopathy and omental metastasis, cirrhosis with portal hypertension, stable liver lesions, no new or progressive disease Cycle 6 FOLFIRI/Avastin  05/07/2023, Fulphila , Emend Cycle 7 FOLFIRI/Avastin  05/21/2023, Fulphila , Emend Cycle 8 FOLFIRI 06/04/2023, Fulphila , Emend; Avastin  held Cycle 9 FOLFIRI 06/18/2023, Fulphila , Emend, Avastin  held Treatment held 07/09/2023 patient request Cycle 10 FOLFIRI 07/18/2023, Fulphila , Emend, Avastin  held CTs 07/31/2023: Stable with unchanged hepatic lesions, mesenteric lymph nodes, and left omental nodule, unchanged morphologic changes of cirrhosis and portal hypertension Cycle 11 FOLFIRI/Avastin  08/01/2023, Fulphila  Chemotherapy held 08/15/2023 per patient request Cycle 12 FOLFIRI/Avastin  08/30/2023, Udenyca  Cycle 13 FOLFIRI/Avastin  09/19/2023, Udenyca  Cycle 14 FOLFIRI/Avastin  10/01/2023, Udenyca  Cycle 15 FOLFIRI/Avastin  10/22/2023, Udenyca  11/05/2023 treatment held per patient request due to dental issue Cycle 16 FOLFIRI 11/19/2023, Udenyca , Avastin  held secondary to planned dental extractions CTs 11/28/2023: No evidence of progressive disease, stable hepatic, left upper quadrant omental, and ileocolic mesenteric lesions Cycle 17 FOLFIRI/Avastin  12/10/2023 Cycle 18 FOLFIRI 12/31/2023, Avastin  held secondary to 12/27/2023 tooth extraction Cycle 19 FOLFIRI/Avastin  01/22/2024 Microcytic anemia secondary to #1-improved Colon polyps-sessile serrated adenoma of the descending colon, tubular adenomas with focal high-grade dysplasia of the transverse colon on colonoscopy 02/12/2020, tubular adenoma of the mid ascending colon on the surgical specimen 04/12/2020 Family history of colon cancer Depression Anxiety/panic attacks Hypertension Hyperlipidemia Nausea secondary to Xeloda ?-Resolved Gastroesophageal reflux disease-improved following discontinuation of Xeloda  Anemia, microcytic; ferritin 6 01/24/2022; stool positive for blood x3  01/25/2022; 02/02/2022 CT abdomen/pelvis enterography-bowel loop demonstrating hypermetabolic activity continues to have a bandlike density along its margin possibly representing a diverticulum or adjacent nodal tissue, difficult to exclude small bowel tumor, 3 x 4 mm left omental nodule or lymph node, known right hepatic lobe tumor is occult on CT. Venofer  weekly x3 beginning 03/03/2022 Negative capsule endoscopy 03/09/2022 Venofer  03/03/2022 for 3 weekly doses Venofer  05/03/2022, 05/19/2022 Venofer  07/28/2022, 08/04/2022 Venofer  03/22/2023, 03/29/2023, 04/04/2023  Venofer  06/18/2023, 07/09/2023 07/03/2023 upper endoscopy and colonoscopy-severe portal hypertensive gastropathy in the gastric fundus, gastric body, stomach, red blood found greater curvature of the stomach and gastric antrum, treated with APC; colonoscopy showed dark old maroon-colored blood in the entire colon.  No evidence of active or recent bleeding in the colon. 12.  Anterior left neck nodule 03/17/2022-cyst?,  Lymph node? 13.  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  Oxaliplatin  neuropathy-mild loss of vibratory sense 12/04/2022, 12/18/2022, 01/15/23       Disposition: Mr. Schweitzer appears stable.  He will complete another cycle of FOLFIRI today.  Avastin  will be added back into the systemic therapy regimen.  He will return for an office visit and chemotherapy in 3 weeks.  He has a history of iron  deficiency anemia.  He is scheduled for IV iron  later this week.  Mr. Campusano will return for an office visit and chemotherapy in 3 weeks.   Coni Deep, MD  01/22/2024  9:30 AM

## 2024-01-22 NOTE — Progress Notes (Signed)
 MD reviewed CEA result: Looks as a one time increase in past month. Will order CTs if higher next visit. Check CEA 1 week prior to 6/16 appointment. Patient notified and scheduling message sent.

## 2024-01-22 NOTE — Progress Notes (Signed)
 Continue Bevacizumab  at 5 mg/kg q3 weeks per Dr. Scherrie Curt.  Telissa Palmisano, Pharm.D., CPP 01/22/2024@11 :10 AM

## 2024-01-22 NOTE — Progress Notes (Signed)
 Patients port accessed x2 due to complication with first flush. Port flushed without difficulty after being reaccessed.  Good blood return noted with no bruising or swelling noted at site.  Patient remains accessed for treatment.

## 2024-01-22 NOTE — Progress Notes (Signed)
 Per MD keep at Beva at 5mg /kg

## 2024-01-24 ENCOUNTER — Other Ambulatory Visit: Payer: Self-pay | Admitting: Nurse Practitioner

## 2024-01-24 ENCOUNTER — Inpatient Hospital Stay

## 2024-01-24 VITALS — BP 114/76 | HR 71 | Temp 98.1°F | Resp 18

## 2024-01-24 DIAGNOSIS — D509 Iron deficiency anemia, unspecified: Secondary | ICD-10-CM | POA: Diagnosis not present

## 2024-01-24 DIAGNOSIS — I10 Essential (primary) hypertension: Secondary | ICD-10-CM | POA: Diagnosis not present

## 2024-01-24 DIAGNOSIS — C182 Malignant neoplasm of ascending colon: Secondary | ICD-10-CM | POA: Diagnosis not present

## 2024-01-24 DIAGNOSIS — Z79899 Other long term (current) drug therapy: Secondary | ICD-10-CM | POA: Diagnosis not present

## 2024-01-24 DIAGNOSIS — K219 Gastro-esophageal reflux disease without esophagitis: Secondary | ICD-10-CM | POA: Diagnosis not present

## 2024-01-24 DIAGNOSIS — E785 Hyperlipidemia, unspecified: Secondary | ICD-10-CM | POA: Diagnosis not present

## 2024-01-24 DIAGNOSIS — C189 Malignant neoplasm of colon, unspecified: Secondary | ICD-10-CM

## 2024-01-24 DIAGNOSIS — Z5111 Encounter for antineoplastic chemotherapy: Secondary | ICD-10-CM | POA: Diagnosis not present

## 2024-01-24 DIAGNOSIS — K7460 Unspecified cirrhosis of liver: Secondary | ICD-10-CM | POA: Diagnosis not present

## 2024-01-24 MED ORDER — HEPARIN SOD (PORK) LOCK FLUSH 100 UNIT/ML IV SOLN
500.0000 [IU] | Freq: Once | INTRAVENOUS | Status: AC | PRN
  Administered 2024-01-24: 500 [IU]

## 2024-01-24 MED ORDER — SODIUM CHLORIDE 0.9 % IV SOLN
300.0000 mg | Freq: Once | INTRAVENOUS | Status: AC
  Administered 2024-01-24: 300 mg via INTRAVENOUS
  Filled 2024-01-24: qty 100

## 2024-01-24 MED ORDER — SODIUM CHLORIDE 0.9% FLUSH
10.0000 mL | INTRAVENOUS | Status: DC | PRN
  Administered 2024-01-24: 10 mL

## 2024-01-24 MED ORDER — SODIUM CHLORIDE 0.9 % IV SOLN: INTRAVENOUS | Status: DC

## 2024-01-24 MED ORDER — PEGFILGRASTIM-CBQV 6 MG/0.6ML ~~LOC~~ SOSY
6.0000 mg | PREFILLED_SYRINGE | Freq: Once | SUBCUTANEOUS | Status: AC
  Administered 2024-01-24: 6 mg via SUBCUTANEOUS

## 2024-01-30 ENCOUNTER — Encounter: Payer: Self-pay | Admitting: Oncology

## 2024-01-31 ENCOUNTER — Encounter: Payer: Self-pay | Admitting: Oncology

## 2024-02-04 ENCOUNTER — Other Ambulatory Visit

## 2024-02-04 ENCOUNTER — Inpatient Hospital Stay: Attending: Oncology

## 2024-02-04 DIAGNOSIS — K219 Gastro-esophageal reflux disease without esophagitis: Secondary | ICD-10-CM | POA: Diagnosis not present

## 2024-02-04 DIAGNOSIS — K648 Other hemorrhoids: Secondary | ICD-10-CM | POA: Insufficient documentation

## 2024-02-04 DIAGNOSIS — K1231 Oral mucositis (ulcerative) due to antineoplastic therapy: Secondary | ICD-10-CM | POA: Diagnosis not present

## 2024-02-04 DIAGNOSIS — F410 Panic disorder [episodic paroxysmal anxiety] without agoraphobia: Secondary | ICD-10-CM | POA: Diagnosis not present

## 2024-02-04 DIAGNOSIS — Z79899 Other long term (current) drug therapy: Secondary | ICD-10-CM | POA: Insufficient documentation

## 2024-02-04 DIAGNOSIS — K7460 Unspecified cirrhosis of liver: Secondary | ICD-10-CM | POA: Insufficient documentation

## 2024-02-04 DIAGNOSIS — I10 Essential (primary) hypertension: Secondary | ICD-10-CM | POA: Diagnosis not present

## 2024-02-04 DIAGNOSIS — Z5111 Encounter for antineoplastic chemotherapy: Secondary | ICD-10-CM | POA: Insufficient documentation

## 2024-02-04 DIAGNOSIS — D509 Iron deficiency anemia, unspecified: Secondary | ICD-10-CM | POA: Insufficient documentation

## 2024-02-04 DIAGNOSIS — F32A Depression, unspecified: Secondary | ICD-10-CM | POA: Insufficient documentation

## 2024-02-04 DIAGNOSIS — K766 Portal hypertension: Secondary | ICD-10-CM | POA: Diagnosis not present

## 2024-02-04 DIAGNOSIS — C189 Malignant neoplasm of colon, unspecified: Secondary | ICD-10-CM

## 2024-02-04 DIAGNOSIS — C182 Malignant neoplasm of ascending colon: Secondary | ICD-10-CM | POA: Diagnosis not present

## 2024-02-04 DIAGNOSIS — E785 Hyperlipidemia, unspecified: Secondary | ICD-10-CM | POA: Diagnosis not present

## 2024-02-04 LAB — CEA (ACCESS): CEA (CHCC): 29.38 ng/mL — ABNORMAL HIGH (ref 0.00–5.00)

## 2024-02-05 ENCOUNTER — Encounter: Payer: Self-pay | Admitting: Oncology

## 2024-02-07 ENCOUNTER — Encounter: Payer: Self-pay | Admitting: Adult Health

## 2024-02-07 ENCOUNTER — Telehealth: Admitting: Adult Health

## 2024-02-07 DIAGNOSIS — F411 Generalized anxiety disorder: Secondary | ICD-10-CM

## 2024-02-07 DIAGNOSIS — F410 Panic disorder [episodic paroxysmal anxiety] without agoraphobia: Secondary | ICD-10-CM | POA: Diagnosis not present

## 2024-02-07 DIAGNOSIS — F429 Obsessive-compulsive disorder, unspecified: Secondary | ICD-10-CM

## 2024-02-07 DIAGNOSIS — F331 Major depressive disorder, recurrent, moderate: Secondary | ICD-10-CM

## 2024-02-07 MED ORDER — SERTRALINE HCL 100 MG PO TABS
100.0000 mg | ORAL_TABLET | Freq: Every day | ORAL | 3 refills | Status: AC
Start: 2024-02-07 — End: ?

## 2024-02-07 MED ORDER — ALPRAZOLAM 1 MG PO TABS: ORAL_TABLET | ORAL | 2 refills | Status: DC

## 2024-02-07 NOTE — Progress Notes (Addendum)
 Anthony Calhoun [id number] 02/03/1966 58 y.o.  Virtual Visit via Video Note  I connected with pt @ on 02/07/24 at  3:00 PM EDT by a video enabled telemedicine application and verified that I am speaking with the correct person using two identifiers.   I discussed the limitations of evaluation and management by telemedicine and the availability of in person appointments. The patient expressed understanding and agreed to proceed.  I discussed the assessment and treatment plan with the patient. The patient was provided an opportunity to ask questions and all were answered. The patient agreed with the plan and demonstrated an understanding of the instructions.   The patient was advised to call back or seek an in-person evaluation if the symptoms worsen or if the condition fails to improve as anticipated.  I provided 25 minutes of non-face-to-face time during this encounter.  The patient was located at home.  The provider was located at [facility].   Reagan Camera, NP   Subjective:   Patient ID:  Anthony Calhoun is a 58 y.o. (DOB Aug 24, 1966) male.  Chief Complaint: No chief complaint on file.   HPI Anthony Calhoun presents for follow-up of GAD, MDD, OCD, panic attacks.  Describes mood today as not the best. Pleasant. Denies tearfulness. Mood symptoms - reports depression, anxiety and irritability - it's minute by minute. Reports decreased interest and motivation. Reports panic attacks. Reports worry, rumination and over thinking. Reports obsessive thoughts and acts. Reports situational stressors - mother living with him - diagnosed with cancer - 58 years old. Reports concerns over his latest lab results and is awaiting a call from his PCP - concerned about a cancer recurrence. Reports mood as lower. Stating I feel like I'm not doing too good right now. Feels like medications are helpful. Taking medications as prescribed.  Energy levels lower. Active, does not  have a regular exercise routine.  Enjoys some usual interests and activities - music. Engaged. Lives with fiance - has an adult son and daughter - 2 grandchildren. Spending time with family. Appetite improved. Weight stable - 247 pounds. Reports sleeping difficulties. Averages 2 hours at night. Reports daytime napping 3.5 to 4 hours. Focus and concentration stable. Completing tasks. Managing aspects of household. Out of work currently - totally disabled. Denies SI or HI.  Denies AH or VH. Denies self harm. Denies substance use.  Previous medication trials:  Celexa , Buspar  Review of Systems:  Review of Systems  Musculoskeletal:  Negative for gait problem.  Neurological:  Negative for tremors.  Psychiatric/Behavioral:         Please refer to HPI   Medications: I have reviewed the patient's current medications.  Current Outpatient Medications  Medication Sig Dispense Refill   Accu-Chek Softclix Lancets lancets SMARTSIG:Topical     acetaminophen  (TYLENOL ) 500 MG tablet Take 500 mg by mouth every 6 (six) hours as needed.     ALPRAZolam  (XANAX ) 1 MG tablet TAKE 1 TABLET BY MOUTH 3 TIMES A DAY AS NEEDED FOR ANXIETY 90 tablet 2   amLODipine  (NORVASC ) 5 MG tablet Take 5 mg by mouth daily.     Bempedoic Acid (NEXLETOL) 180 MG TABS Take 180 mg by mouth daily. (Patient not taking: Reported on 10/15/2023)     carvedilol  (COREG ) 6.25 MG tablet Take 6.25 mg by mouth 2 (two) times daily with a meal.     Continuous Glucose Sensor (FREESTYLE LIBRE 3 PLUS SENSOR) MISC Use one sensor every 15 days to monitor blood sugars continuously  for 30 days (Patient not taking: Reported on 01/22/2024)     HUMALOG KWIKPEN 100 UNIT/ML KwikPen Inject into the skin 3 (three) times daily. Sliding scale     hydrocortisone (ANUSOL-HC) 25 MG suppository Place 25 mg rectally daily. (Patient not taking: Reported on 01/22/2024)     LANTUS SOLOSTAR 100 UNIT/ML Solostar Pen Inject 45 Units into the skin daily. Taper up daily      lidocaine  (LIDODERM ) 5 % Place 1 patch onto the skin daily. (Patient not taking: Reported on 01/22/2024)     lidocaine -prilocaine  (EMLA ) cream Apply 1 Application topically as needed. Apply 1/2 tablespoon to port site 1-2 hours prior to stick and cover w/Press-and-Seal to numb site for port access (Patient not taking: Reported on 12/10/2023) 30 g 0   magic mouthwash (nystatin , diphenhydrAMINE , alum & mag hydroxide) suspension mixture Swish and spit 5 mLs by mouth up to 4 (four) times daily as needed for mouth sores. 240 mL 1   magic mouthwash (nystatin , diphenhydrAMINE , alum & mag hydroxide) suspension mixture Swish and spit 5 mLs 4 (four) times daily as needed for mouth pain. 240 mL 1   pantoprazole  (PROTONIX ) 40 MG tablet TAKE 1 TABLET BY MOUTH EVERY DAY (Patient taking differently: Take 40 mg by mouth 2 (two) times daily.) 30 tablet 2   promethazine  (PHENERGAN ) 25 MG tablet Take 1 tablet (25 mg total) by mouth every 6 (six) hours as needed. 60 tablet 1   sertraline  (ZOLOFT ) 100 MG tablet Take 1 tablet (100 mg total) by mouth daily. 90 tablet 3   tiZANidine (ZANAFLEX) 2 MG tablet Take 2 mg by mouth every 12 (twelve) hours.     triamcinolone  (KENALOG ) 0.1 % paste USE AS DIRECTED IN THE MOUTH OR THROAT 2 (TWO) TIMES DAILY AS NEEDED (ORAL IRRITATION). (Patient not taking: Reported on 01/22/2024) 5 g 1   No current facility-administered medications for this visit.   Facility-Administered Medications Ordered in Other Visits  Medication Dose Route Frequency Provider Last Rate Last Admin   sodium chloride  flush (NS) 0.9 % injection 10 mL  10 mL Intravenous PRN Sherrill, Gary B, MD   10 mL at 08/15/23 1101    Medication Side Effects: None  Allergies:  Allergies  Allergen Reactions   Chlorthalidone Swelling    Swelling of lips   Losartan Potassium-Hctz Swelling    Lip swelling   Cyclobenzaprine Other (See Comments)    Unknown reaction   Gabapentin  Nausea Only   Prednisone Anxiety    panic  attack Panic attacks    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 30s; smoking hx   Ovarian cancer Maternal Aunt        d. after 50   Colon cancer Maternal Uncle  mother's pat half brother; dx unknown age   Lung cancer Half-Brother        mat half brother    Social History   Socioeconomic History   Marital status: Divorced    Spouse name: Not on file   Number of children: Not on file   Years of education: Not on file   Highest education level: Not on file  Occupational History   Not on file  Tobacco Use   Smoking status: Never    Passive exposure: Past   Smokeless tobacco: Never  Vaping Use   Vaping status: Never Used  Substance and Sexual Activity   Alcohol use: No   Drug use: No   Sexual activity: Not Currently  Other Topics Concern   Not on file  Social History Narrative   Not on file   Social Drivers of Health   Financial Resource Strain: Medium Risk (01/17/2022)   Overall Financial Resource Strain (CARDIA)    Difficulty of Paying Living Expenses: Somewhat hard  Food Insecurity: No Food Insecurity (05/26/2022)   Hunger Vital Sign    Worried About Running Out of Food in the Last Year: Never true    Ran Out of Food in the Last Year: Never true  Transportation Needs: No Transportation Needs (05/26/2022)   PRAPARE - Administrator, Civil Service (Medical): No    Lack of Transportation (Non-Medical): No  Physical Activity:  Inactive (01/17/2022)   Exercise Vital Sign    Days of Exercise per Week: 0 days    Minutes of Exercise per Session: 0 min  Stress: Stress Concern Present (01/17/2022)   [employer] of Occupational Health - Occupational Stress Questionnaire    Feeling of Stress : Rather much  Social Connections: Socially Isolated (01/17/2022)   Social Connection and Isolation Panel    Frequency of Communication with Friends and Family: Three times a week    Frequency of Social Gatherings with Friends and Family: Three times a week    Attends Religious Services: Never    Active Member of Clubs or Organizations: No    Attends Banker Meetings: Never    Marital Status: Divorced  Catering manager Violence: Not At Risk (05/26/2022)   Humiliation, Afraid, Rape, and Kick questionnaire    Fear of Current or Ex-Partner: No    Emotionally Abused: No    Physically Abused: No    Sexually Abused: No    Past Medical History, Surgical history, Social history, and Family history were reviewed and updated as appropriate.   Please see review of systems for further details on the patient's review from today.   Objective:   Physical Exam:  There were no vitals taken for this visit.  Physical Exam Constitutional:      General: He is not in acute distress.  Musculoskeletal:        General: No deformity.   Neurological:     Mental Status: He is alert and oriented to person, place, and time.     Coordination: Coordination normal.   Psychiatric:        Attention and Perception: Attention and perception normal. He does not perceive auditory or visual hallucinations.        Mood and Affect: Mood is anxious and depressed. Affect is not labile, blunt, angry or inappropriate.        Speech: Speech normal.        Behavior: Behavior normal.        Thought Content: Thought content  normal. Thought content is not paranoid or delusional. Thought content does not include homicidal or suicidal ideation.  Thought content does not include homicidal or suicidal plan.        Cognition and Memory: Cognition and memory normal.        Judgment: Judgment normal.     Comments: Insight intact     Lab Review:     Component Value Date/Time   NA 132 (L) 01/22/2024 0758   K 3.9 01/22/2024 0758   CL 96 (L) 01/22/2024 0758   CO2 24 01/22/2024 0758   GLUCOSE 368 (H) 01/22/2024 0758   BUN 14 01/22/2024 0758   CREATININE 0.82 01/22/2024 0758   CALCIUM  9.1 01/22/2024 0758   PROT 7.2 01/22/2024 0758   ALBUMIN 4.0 01/22/2024 0758   AST 39 01/22/2024 0758   ALT 43 01/22/2024 0758   ALKPHOS 186 (H) 01/22/2024 0758   BILITOT 0.7 01/22/2024 0758   GFRNONAA >60 01/22/2024 0758   GFRAA >60 05/10/2020 1543       Component Value Date/Time   WBC 5.9 01/22/2024 0758   WBC 8.4 05/27/2022 0527   RBC 4.07 (L) 01/22/2024 0758   HGB 9.4 (L) 01/22/2024 0758   HCT 32.1 (L) 01/22/2024 0758   PLT 189 01/22/2024 0758   MCV 78.9 (L) 01/22/2024 0758   MCH 23.1 (L) 01/22/2024 0758   MCHC 29.3 (L) 01/22/2024 0758   RDW 21.2 (H) 01/22/2024 0758   LYMPHSABS 0.8 01/22/2024 0758   MONOABS 0.4 01/22/2024 0758   EOSABS 0.1 01/22/2024 0758   BASOSABS 0.1 01/22/2024 0758    No results found for: POCLITH, LITHIUM   No results found for: PHENYTOIN, PHENOBARB, VALPROATE, CBMZ   .res Assessment: Plan:    Plan:  PDMP reviewed  Xanax  1mg  TID Zoloft  100mg  daily   RTC 6 months  25 minutes spent dedicated to the care of this patient on the date of this encounter to include pre-visit review of records, ordering of medication, post visit documentation, and face-to-face time with the patient discussing GAD, MDD, OCD and panic attacks. Discussed continuing current medication regimen.  Patient advised to contact office with any questions, adverse effects, or acute worsening in signs and symptoms.  Discussed potential benefits, risk, and side effects of benzodiazepines to include potential risk of tolerance  and dependence, as well as possible drowsiness. Advised patient not to drive if experiencing drowsiness and to take lowest possible effective dose to minimize risk of dependence and tolerance.  Diagnoses and all orders for this visit:  Generalized anxiety disorder -     ALPRAZolam  (XANAX ) 1 MG tablet; TAKE 1 TABLET BY MOUTH 3 TIMES A DAY AS NEEDED FOR ANXIETY -     sertraline  (ZOLOFT ) 100 MG tablet; Take 1 tablet (100 mg total) by mouth daily.  Panic attacks -     ALPRAZolam  (XANAX ) 1 MG tablet; TAKE 1 TABLET BY MOUTH 3 TIMES A DAY AS NEEDED FOR ANXIETY -     sertraline  (ZOLOFT ) 100 MG tablet; Take 1 tablet (100 mg total) by mouth daily.  Major depressive disorder, recurrent episode, moderate (HCC) -     sertraline  (ZOLOFT ) 100 MG tablet; Take 1 tablet (100 mg total) by mouth daily.  Obsessive-compulsive disorder, unspecified type -     sertraline  (ZOLOFT ) 100 MG tablet; Take 1 tablet (100 mg total) by mouth daily.     Please see After Visit Summary for patient specific instructions.  Future Appointments  Date Time Provider Department Center  02/11/2024  8:00 AM [facility] PHLEBOTOMIST [facility] None  02/11/2024  8:15 AM [facility] INFUSION [facility] None  02/11/2024  8:40 AM Sumner Ends, MD [facility] None  02/11/2024  9:15 AM [facility] INFUSION [facility] None  02/13/2024 12:45 PM [facility] INFUSION [facility] None    No orders of the defined types were placed in this encounter.     -------------------------------

## 2024-02-09 ENCOUNTER — Other Ambulatory Visit: Payer: Self-pay | Admitting: Oncology

## 2024-02-11 ENCOUNTER — Ambulatory Visit

## 2024-02-11 ENCOUNTER — Inpatient Hospital Stay (HOSPITAL_BASED_OUTPATIENT_CLINIC_OR_DEPARTMENT_OTHER): Admitting: Oncology

## 2024-02-11 ENCOUNTER — Inpatient Hospital Stay

## 2024-02-11 ENCOUNTER — Ambulatory Visit: Payer: Self-pay | Admitting: Oncology

## 2024-02-11 ENCOUNTER — Encounter: Payer: Self-pay | Admitting: Oncology

## 2024-02-11 VITALS — BP 140/75 | HR 100 | Temp 98.1°F | Resp 18 | Ht 66.0 in | Wt 248.1 lb

## 2024-02-11 DIAGNOSIS — K7460 Unspecified cirrhosis of liver: Secondary | ICD-10-CM | POA: Diagnosis not present

## 2024-02-11 DIAGNOSIS — C182 Malignant neoplasm of ascending colon: Secondary | ICD-10-CM | POA: Diagnosis not present

## 2024-02-11 DIAGNOSIS — E785 Hyperlipidemia, unspecified: Secondary | ICD-10-CM | POA: Diagnosis not present

## 2024-02-11 DIAGNOSIS — C189 Malignant neoplasm of colon, unspecified: Secondary | ICD-10-CM | POA: Diagnosis not present

## 2024-02-11 DIAGNOSIS — K1231 Oral mucositis (ulcerative) due to antineoplastic therapy: Secondary | ICD-10-CM | POA: Diagnosis not present

## 2024-02-11 DIAGNOSIS — C787 Secondary malignant neoplasm of liver and intrahepatic bile duct: Secondary | ICD-10-CM | POA: Diagnosis not present

## 2024-02-11 DIAGNOSIS — D509 Iron deficiency anemia, unspecified: Secondary | ICD-10-CM | POA: Diagnosis not present

## 2024-02-11 DIAGNOSIS — Z5111 Encounter for antineoplastic chemotherapy: Secondary | ICD-10-CM | POA: Diagnosis not present

## 2024-02-11 DIAGNOSIS — Z79899 Other long term (current) drug therapy: Secondary | ICD-10-CM | POA: Diagnosis not present

## 2024-02-11 DIAGNOSIS — I10 Essential (primary) hypertension: Secondary | ICD-10-CM | POA: Diagnosis not present

## 2024-02-11 DIAGNOSIS — K766 Portal hypertension: Secondary | ICD-10-CM | POA: Diagnosis not present

## 2024-02-11 DIAGNOSIS — K219 Gastro-esophageal reflux disease without esophagitis: Secondary | ICD-10-CM | POA: Diagnosis not present

## 2024-02-11 DIAGNOSIS — K648 Other hemorrhoids: Secondary | ICD-10-CM | POA: Diagnosis not present

## 2024-02-11 LAB — TOTAL PROTEIN, URINE DIPSTICK: Protein, ur: NEGATIVE mg/dL

## 2024-02-11 LAB — CMP (CANCER CENTER ONLY)
ALT: 42 U/L (ref 0–44)
AST: 37 U/L (ref 15–41)
Albumin: 4.2 g/dL (ref 3.5–5.0)
Alkaline Phosphatase: 208 U/L — ABNORMAL HIGH (ref 38–126)
Anion gap: 14 (ref 5–15)
BUN: 9 mg/dL (ref 6–20)
CO2: 23 mmol/L (ref 22–32)
Calcium: 9.5 mg/dL (ref 8.9–10.3)
Chloride: 99 mmol/L (ref 98–111)
Creatinine: 0.8 mg/dL (ref 0.61–1.24)
GFR, Estimated: >60 mL/min (ref >60)
Glucose, Bld: 270 mg/dL — ABNORMAL HIGH (ref 70–99)
Potassium: 4 mmol/L (ref 3.5–5.1)
Sodium: 135 mmol/L (ref 135–145)
Total Bilirubin: 0.7 mg/dL (ref 0.0–1.2)
Total Protein: 7.7 g/dL (ref 6.5–8.1)

## 2024-02-11 LAB — CBC WITH DIFFERENTIAL (CANCER CENTER ONLY)
Abs Immature Granulocytes: 0.06 10*3/uL (ref 0.00–0.07)
Basophils Absolute: 0.1 10*3/uL (ref 0.0–0.1)
Basophils Relative: 1 %
Eosinophils Absolute: 0.2 10*3/uL (ref 0.0–0.5)
Eosinophils Relative: 3 %
HCT: 36 % — ABNORMAL LOW (ref 39.0–52.0)
Hemoglobin: 10.7 g/dL — ABNORMAL LOW (ref 13.0–17.0)
Immature Granulocytes: 1 %
Lymphocytes Relative: 15 %
Lymphs Abs: 1 10*3/uL (ref 0.7–4.0)
MCH: 23.6 pg — ABNORMAL LOW (ref 26.0–34.0)
MCHC: 29.7 g/dL — ABNORMAL LOW (ref 30.0–36.0)
MCV: 79.3 fL — ABNORMAL LOW (ref 80.0–100.0)
Monocytes Absolute: 0.5 10*3/uL (ref 0.1–1.0)
Monocytes Relative: 7 %
Neutro Abs: 5 10*3/uL (ref 1.7–7.7)
Neutrophils Relative %: 73 %
Platelet Count: 185 10*3/uL (ref 150–400)
RBC: 4.54 MIL/uL (ref 4.22–5.81)
RDW: 21.2 % — ABNORMAL HIGH (ref 11.5–15.5)
WBC Count: 6.8 10*3/uL (ref 4.0–10.5)
nRBC: 0 % (ref 0.0–0.2)

## 2024-02-11 LAB — CEA (ACCESS): CEA (CHCC): 24.31 ng/mL — ABNORMAL HIGH (ref 0.00–5.00)

## 2024-02-11 NOTE — Telephone Encounter (Signed)
-----   Message from Coni Deep sent at 02/11/2024  1:14 PM EDT ----- Please call patient the CEA is lower, follow-up as scheduled for treatment tomorrow  ----- Message ----- From: Dannis Dy, [facility] [place] Sent: 02/11/2024   9:43 AM EDT To: Sumner Ends, MD

## 2024-02-11 NOTE — Telephone Encounter (Signed)
 Patient gave verbal understanding and had no further questions or concerns

## 2024-02-11 NOTE — Progress Notes (Signed)
 [facility] OFFICE PROGRESS NOTE   Diagnosis: Colon cancer  INTERVAL HISTORY:   Mr. Robitaille completed another cycle of FOLFIRI/bevacizumab  on 01/22/2024.  He had mouth soreness and nausea following chemotherapy.  He feels better at present.  He received IV iron  on 01/24/2024.  Back pain has resolved.  Objective:  Vital signs in last 24 hours:  Blood pressure (!) 140/75, pulse 100, temperature 98.1 F (36.7 C), temperature source Temporal, resp. rate 18, height 5' 6 (1.676 m), weight 248 lb 1.6 oz (112.5 kg), SpO2 98%.    HEENT: No thrush or ulcers Resp: Lungs clear bilaterally Cardio: Regular rate and rhythm GI: No mass, no hepatosplenomegaly, nontender Vascular: No leg edema   Portacath/PICC-without erythema  Lab Results:  Lab Results  Component Value Date   WBC 6.8 02/11/2024   HGB 10.7 (L) 02/11/2024   HCT 36.0 (L) 02/11/2024   MCV 79.3 (L) 02/11/2024   PLT 185 02/11/2024   NEUTROABS 5.0 02/11/2024    CMP  Lab Results  Component Value Date   NA 135 02/11/2024   K 4.0 02/11/2024   CL 99 02/11/2024   CO2 23 02/11/2024   GLUCOSE 270 (H) 02/11/2024   BUN 9 02/11/2024   CREATININE 0.80 02/11/2024   CALCIUM  9.5 02/11/2024   PROT 7.7 02/11/2024   ALBUMIN 4.2 02/11/2024   AST 37 02/11/2024   ALT 42 02/11/2024   ALKPHOS 208 (H) 02/11/2024   BILITOT 0.7 02/11/2024   GFRNONAA >60 02/11/2024   GFRAA >60 05/10/2020    Lab Results  Component Value Date   CEA1 1.08 03/07/2021   CEA 29.38 (H) 02/04/2024    Medications: I have reviewed the patient's current medications.   Assessment/Plan: Moderately differentiated adenocarcinoma of the ascending colon, stage IIb (T4aN0), status post a right colectomy 04/12/2020 Tumor invades the visceral peritoneum, 0/19 lymph nodes, no lymphovascular or perineural invasion, no tumor deposits, MSS, no loss of mismatch repair protein expression Cycle 1 adjuvant Xeloda  05/17/2020 Cycle 2 adjuvant Xeloda   06/07/2020, Xeloda  discontinued after 12 days secondary to nausea and rectal bleeding Cycle 3 adjuvant Xeloda  06/28/2020, dose reduced to 2000 mg a.m., 1500 mg p.m. secondary to nausea (patient discontinued Xeloda  on day 11 due to colonoscopy) Colonoscopy 07/09/2020-nodular mucosa at the colonic anastomosis, biopsied; patent end-to-side ileocolonic anastomosis characterized by healthy-appearing mucosa and visible sutures; nonbleeding internal hemorrhoids, biopsy with benign ulcerated anastomotic mucosa with granulation tissue Cycle 4 adjuvant Xeloda  07/19/2020, 2000 mg every morning and 1500 mg every afternoon Cycle 5 adjuvant Xeloda  08/09/2020 Cycle 6 adjuvant Xeloda  08/31/2019 Cycle 7 adjuvant Xeloda  09/20/2020 Cycle 8 adjuvant Xeloda  10/11/2020 CTs 03/07/2021-no evidence of recurrent disease, hepatic steatosis, slight wall thickening at the junction of the descending and horizontal duodenum Mild elevation of CEA 2023 12/01/2021-Guardant Reveal-ct DNA detected PET 12/30/2021-hypermetabolic right liver lesion, hypermetabolic area in a loop of mid small bowel with an SUV of 12.5, review of PET images at GI tumor conference 01/11/2022-small bowel uptake felt to be a benign finding MRI liver 01/10/2022-solitary 1.2 cm right liver lesion between segments 7 and 8 compatible with metastasis, subtle changes suggesting cirrhosis, hepatic steatosis CT enteroscopy 02/02/2022-small bowel loop with hypermetabolic activity on PET continues to have a bandlike density along the margin felt to represent a diverticulum or adjacent nodal tissue.  Small bowel tumor not excluded.  3-4 mm left omental nodule 02/08/2022-biopsy and ablation of solitary liver lesion, adenocarcinoma consistent with metastatic colorectal adenocarcinoma CTs 03/31/2022-unchanged circumstantial soft tissue thickening surrounding a loop of mid to  distal small bowel with an adjacent nodular focus measuring 1.2 cm.,  Ablation cavity in segment 7, no other evidence  of metastatic disease Diagnostic laparoscopy 05/26/2022-mid small bowel mass-resected, well to moderately differentiated adenocarcinoma, 0/2 lymph nodes, morphology consistent with a colon primary, tumor involved full-thickness including serosa with perineural and large vessel involvement; foundation 1-microsatellite stable, tumor mutation burden 2, K-ras Q61H CT abdomen/pelvis 07/10/2022-the right liver ablation defect is smaller, new subtle right liver lesion measuring 13 x 9 mm, enlarged lymph node in the right upper quadrant mesentery adjacent to surgical anastomosis PET 07/26/2022-new segment 6 liver lesion, enlarging hypermetabolic left omental lesion, there is another mildly hypermetabolic peritoneal implant, 7 mm omental nodule at the midline without hypermetabolism Cycle 1 FOLFOX 09/25/2022 Cycle 2 FOLFOX/bevacizumab  10/09/2022, 5-FU dose reduced secondary to mucositis Cycle 3 FOLFOX/bevacizumab  10/23/2022 Cycle 4 FOLFOX/bevacizumab  11/06/2022 Cycle 5 FOLFOX/bevacizumab  11/20/2022 Cycle 6 FOLFOX/bevacizumab  12/04/2022 CT abdomen/pelvis 12/14/2022-further contraction of the treated segment 7 lesion, slight enlargement of segment 6 lesion, no new liver lesion, increased size of ileocolic nodes and an omental lesion, stable small retroperitoneal nodes Cycle 7 FOLFOX/bevacizumab  12/18/2022 Cycle 8 FOLFOX/bevacizumab  01/02/2023, oxaliplatin  dose reduced secondary to prolonged nausea and neuropathy symptoms, Emend added Cycle 9 FOLFOX/bevacizumab  01/16/2023, oxaliplatin  discontinued secondary to neuropathy Cycle 1 FOLFIRI 02/19/2023, Avastin  held due to recent dental extractions Cycle 2 FOLFIRI/Avastin  03/05/2023 Cycle 3 FOLFIRI/Avastin  03/19/2023, 5-FU bolus held due to mucositis after cycle 2, Fulphila  added Cycle 4 FOLFIRI/Avastin  04/02/2023, Fulphila , Emend Cycle 5 FOLFIRI/Avastin  04/16/2023, Fulphila , Emend CTs 05/02/2023-similar ileocolic mesenteric adenopathy and omental metastasis, cirrhosis with portal  hypertension, stable liver lesions, no new or progressive disease Cycle 6 FOLFIRI/Avastin  05/07/2023, Fulphila , Emend Cycle 7 FOLFIRI/Avastin  05/21/2023, Fulphila , Emend Cycle 8 FOLFIRI 06/04/2023, Fulphila , Emend; Avastin  held Cycle 9 FOLFIRI 06/18/2023, Fulphila , Emend, Avastin  held Treatment held 07/09/2023 patient request Cycle 10 FOLFIRI 07/18/2023, Fulphila , Emend, Avastin  held CTs 07/31/2023: Stable with unchanged hepatic lesions, mesenteric lymph nodes, and left omental nodule, unchanged morphologic changes of cirrhosis and portal hypertension Cycle 11 FOLFIRI/Avastin  08/01/2023, Fulphila  Chemotherapy held 08/15/2023 per patient request Cycle 12 FOLFIRI/Avastin  08/30/2023, Udenyca  Cycle 13 FOLFIRI/Avastin  09/19/2023, Udenyca  Cycle 14 FOLFIRI/Avastin  10/01/2023, Udenyca  Cycle 15 FOLFIRI/Avastin  10/22/2023, Udenyca  11/05/2023 treatment held per patient request due to dental issue Cycle 16 FOLFIRI 11/19/2023, Udenyca , Avastin  held secondary to planned dental extractions CTs 11/28/2023: No evidence of progressive disease, stable hepatic, left upper quadrant omental, and ileocolic mesenteric lesions Cycle 17 FOLFIRI/Avastin  12/10/2023 Cycle 18 FOLFIRI 12/31/2023, Avastin  held secondary to 12/27/2023 tooth extraction Cycle 19 FOLFIRI/Avastin  01/22/2024 Cycle 20 FOLFIRI/Avastin  02/12/2024 Microcytic anemia secondary to #1-improved Colon polyps-sessile serrated adenoma of the descending colon, tubular adenomas with focal high-grade dysplasia of the transverse colon on colonoscopy 02/12/2020, tubular adenoma of the mid ascending colon on the surgical specimen 04/12/2020 Family history of colon cancer Depression Anxiety/panic attacks Hypertension Hyperlipidemia Nausea secondary to Xeloda ?-Resolved Gastroesophageal reflux disease-improved following discontinuation of Xeloda  Anemia, microcytic; ferritin 6 01/24/2022; stool positive for blood x3 01/25/2022; 02/02/2022 CT abdomen/pelvis enterography-bowel loop  demonstrating hypermetabolic activity continues to have a bandlike density along its margin possibly representing a diverticulum or adjacent nodal tissue, difficult to exclude small bowel tumor, 3 x 4 mm left omental nodule or lymph node, known right hepatic lobe tumor is occult on CT. Venofer  weekly x3 beginning 03/03/2022 Negative capsule endoscopy 03/09/2022 Venofer  03/03/2022 for 3 weekly doses Venofer  05/03/2022, 05/19/2022 Venofer  07/28/2022, 08/04/2022 Venofer  03/22/2023, 03/29/2023, 04/04/2023 Venofer  06/18/2023, 07/09/2023 07/03/2023 upper endoscopy and colonoscopy-severe portal hypertensive gastropathy in the gastric fundus, gastric body, stomach, red blood  found greater curvature of the stomach and gastric antrum, treated with APC; colonoscopy showed dark old maroon-colored blood in the entire colon.  No evidence of active or recent bleeding in the colon. 12.  Anterior left neck nodule 03/17/2022-cyst?,  Lymph node? 13.  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  Oxaliplatin  neuropathy-mild loss of vibratory sense 12/04/2022, 12/18/2022, 01/02/2023         Disposition: Mr. Blankenbaker appears stable.  The CEA was slightly higher when he was here 3 weeks ago, but is lower today.  The plan is to continue FOLFIRI/bevacizumab .  Chemotherapy will be held today per his request due to a family member's illness.  He will return for the next cycle of chemotherapy on 02/12/2024.  He will be referred for staging CTs in July.  Mr. Banks will return for an office visit and chemotherapy in 3 weeks.  Coni Deep, MD  02/11/2024  8:54 AM

## 2024-02-12 ENCOUNTER — Inpatient Hospital Stay

## 2024-02-12 ENCOUNTER — Other Ambulatory Visit: Payer: Self-pay

## 2024-02-12 VITALS — BP 127/68 | HR 75 | Temp 98.4°F | Resp 18

## 2024-02-12 DIAGNOSIS — K648 Other hemorrhoids: Secondary | ICD-10-CM | POA: Diagnosis not present

## 2024-02-12 DIAGNOSIS — Z5111 Encounter for antineoplastic chemotherapy: Secondary | ICD-10-CM | POA: Diagnosis not present

## 2024-02-12 DIAGNOSIS — Z79899 Other long term (current) drug therapy: Secondary | ICD-10-CM | POA: Diagnosis not present

## 2024-02-12 DIAGNOSIS — Z95828 Presence of other vascular implants and grafts: Secondary | ICD-10-CM

## 2024-02-12 DIAGNOSIS — K7460 Unspecified cirrhosis of liver: Secondary | ICD-10-CM | POA: Diagnosis not present

## 2024-02-12 DIAGNOSIS — K1231 Oral mucositis (ulcerative) due to antineoplastic therapy: Secondary | ICD-10-CM | POA: Diagnosis not present

## 2024-02-12 DIAGNOSIS — I10 Essential (primary) hypertension: Secondary | ICD-10-CM | POA: Diagnosis not present

## 2024-02-12 DIAGNOSIS — K219 Gastro-esophageal reflux disease without esophagitis: Secondary | ICD-10-CM | POA: Diagnosis not present

## 2024-02-12 DIAGNOSIS — D509 Iron deficiency anemia, unspecified: Secondary | ICD-10-CM | POA: Diagnosis not present

## 2024-02-12 DIAGNOSIS — K766 Portal hypertension: Secondary | ICD-10-CM | POA: Diagnosis not present

## 2024-02-12 DIAGNOSIS — C189 Malignant neoplasm of colon, unspecified: Secondary | ICD-10-CM

## 2024-02-12 DIAGNOSIS — C182 Malignant neoplasm of ascending colon: Secondary | ICD-10-CM | POA: Diagnosis not present

## 2024-02-12 DIAGNOSIS — E785 Hyperlipidemia, unspecified: Secondary | ICD-10-CM | POA: Diagnosis not present

## 2024-02-12 MED ORDER — SODIUM CHLORIDE 0.9 % IV SOLN
200.0000 mg/m2 | Freq: Once | INTRAVENOUS | Status: AC
  Administered 2024-02-12: 444 mg via INTRAVENOUS
  Filled 2024-02-12: qty 22.2

## 2024-02-12 MED ORDER — FLUOROURACIL CHEMO INJECTION 500 MG/10ML
200.0000 mg/m2 | Freq: Once | INTRAVENOUS | Status: AC
  Administered 2024-02-12: 450 mg via INTRAVENOUS
  Filled 2024-02-12: qty 9

## 2024-02-12 MED ORDER — DEXAMETHASONE SODIUM PHOSPHATE 10 MG/ML IJ SOLN
10.0000 mg | Freq: Once | INTRAMUSCULAR | Status: AC
  Administered 2024-02-12: 10 mg via INTRAVENOUS
  Filled 2024-02-12: qty 1

## 2024-02-12 MED ORDER — SODIUM CHLORIDE 0.9 % IV SOLN: Freq: Once | INTRAVENOUS | Status: AC

## 2024-02-12 MED ORDER — SODIUM CHLORIDE 0.9 % IV SOLN
1600.0000 mg/m2 | INTRAVENOUS | Status: DC
  Administered 2024-02-12: 3500 mg via INTRAVENOUS
  Filled 2024-02-12: qty 70

## 2024-02-12 MED ORDER — SODIUM CHLORIDE 0.9 % IV SOLN
5.0000 mg/kg | Freq: Once | INTRAVENOUS | Status: AC
  Administered 2024-02-12: 600 mg via INTRAVENOUS
  Filled 2024-02-12: qty 16

## 2024-02-12 MED ORDER — PALONOSETRON HCL INJECTION 0.25 MG/5ML
0.2500 mg | Freq: Once | INTRAVENOUS | Status: AC
  Administered 2024-02-12: 0.25 mg via INTRAVENOUS
  Filled 2024-02-12: qty 5

## 2024-02-12 MED ORDER — SODIUM CHLORIDE 0.9 % IV SOLN
150.0000 mg | Freq: Once | INTRAVENOUS | Status: AC
  Administered 2024-02-12: 150 mg via INTRAVENOUS
  Filled 2024-02-12: qty 150

## 2024-02-12 MED ORDER — SODIUM CHLORIDE 0.9 % IV SOLN
180.0000 mg/m2 | Freq: Once | INTRAVENOUS | Status: AC
  Administered 2024-02-12: 400 mg via INTRAVENOUS
  Filled 2024-02-12: qty 15

## 2024-02-12 MED ORDER — SODIUM CHLORIDE 0.9% FLUSH
10.0000 mL | Freq: Once | INTRAVENOUS | Status: AC
  Administered 2024-02-12: 10 mL via INTRAVENOUS

## 2024-02-12 MED ORDER — ATROPINE SULFATE 1 MG/ML IV SOLN
0.5000 mg | Freq: Once | INTRAVENOUS | Status: AC | PRN
  Administered 2024-02-12: 0.5 mg via INTRAVENOUS
  Filled 2024-02-12: qty 1

## 2024-02-12 NOTE — Patient Instructions (Addendum)
 [facility] - A DEPT OF [facility]  Discharge Instructions: Thank you for choosing [facility] to provide your oncology and hematology care.   If you have a lab appointment with the Cancer Center, please go directly to the Cancer Center and check in at the registration area.   Wear comfortable clothing and clothing appropriate for easy access to any Portacath or PICC line.   We strive to give you quality time with your provider. You may need to reschedule your appointment if you arrive late (15 or more minutes).  Arriving late affects you and other patients whose appointments are after yours.  Also, if you miss three or more appointments without notifying the office, you may be dismissed from the clinic at the provider's discretion.      For prescription refill requests, have your pharmacy contact our office and allow 72 hours for refills to be completed.    Today you received the following chemotherapy and/or immunotherapy agents: Bevacizumab  (Mavasi), Irinotecan , Leucovorin , and Fluorouracil .   To help prevent nausea and vomiting after your treatment, we encourage you to take your nausea medication as directed.  BELOW ARE SYMPTOMS THAT SHOULD BE REPORTED IMMEDIATELY: *FEVER GREATER THAN 100.4 F (38 C) OR HIGHER *CHILLS OR SWEATING *NAUSEA AND VOMITING THAT IS NOT CONTROLLED WITH YOUR NAUSEA MEDICATION *UNUSUAL SHORTNESS OF BREATH *UNUSUAL BRUISING OR BLEEDING *URINARY PROBLEMS (pain or burning when urinating, or frequent urination) *BOWEL PROBLEMS (unusual diarrhea, constipation, pain near the anus) TENDERNESS IN MOUTH AND THROAT WITH OR WITHOUT PRESENCE OF ULCERS (sore throat, sores in mouth, or a toothache) UNUSUAL RASH, SWELLING OR PAIN  UNUSUAL VAGINAL DISCHARGE OR ITCHING   Items with * indicate a potential emergency and should be followed up as soon as possible or go to the Emergency Department if any problems should occur.  Please  show the CHEMOTHERAPY ALERT CARD or IMMUNOTHERAPY ALERT CARD at check-in to the Emergency Department and triage nurse.  Should you have questions after your visit or need to cancel or reschedule your appointment, please contact [facility] - A DEPT OF [facility]  Dept: [phone]  and follow the prompts.  Office hours are 8:00 a.m. to 4:30 p.m. Monday - Friday. Please note that voicemails left after 4:00 p.m. may not be returned until the following business day.  We are closed weekends and major holidays. You have access to a nurse at all times for urgent questions. Please call the main number to the clinic Dept: [phone] and follow the prompts.   For any non-urgent questions, you may also contact your provider using MyChart. We now offer e-Visits for anyone 44 and older to request care online for non-urgent symptoms. For details visit [email or web address].   Also download the MyChart app! Go to the app store, search MyChart, open the app, select [place], and log in with your MyChart username and password.  The chemotherapy medication bag should finish at 46 hours, 96 hours, or 7 days. For example, if your pump is scheduled for 46 hours and it was put on at 4:00 p.m., it should finish at 2:00 p.m. the day it is scheduled to come off regardless of your appointment time.     Estimated time to finish at: 2pm Thursday 02/14/24   If the display on your pump reads Low Volume and it is beeping, take the batteries out of the pump and come to the cancer center for it to  be taken off.   If the pump alarms go off prior to the pump reading Low Volume then call [phone] and someone can assist you.  If the plunger comes out and the chemotherapy medication is leaking out, please use your home chemo spill kit to clean up the spill. Do NOT use paper towels or other household products.  If you have problems or questions regarding your pump, please call either  [phone] (24 hours a day) or the cancer center Monday-Friday 8:00 a.m.- 4:30 p.m. at the clinic number and we will assist you. If you are unable to get assistance, then go to the nearest Emergency Department and ask the staff to contact the IV team for assistance.

## 2024-02-13 ENCOUNTER — Encounter

## 2024-02-14 ENCOUNTER — Inpatient Hospital Stay

## 2024-02-14 ENCOUNTER — Encounter: Payer: Self-pay | Admitting: Oncology

## 2024-02-14 VITALS — BP 133/64 | HR 78 | Resp 18

## 2024-02-14 DIAGNOSIS — C787 Secondary malignant neoplasm of liver and intrahepatic bile duct: Secondary | ICD-10-CM

## 2024-02-14 DIAGNOSIS — Z79899 Other long term (current) drug therapy: Secondary | ICD-10-CM | POA: Diagnosis not present

## 2024-02-14 DIAGNOSIS — K1231 Oral mucositis (ulcerative) due to antineoplastic therapy: Secondary | ICD-10-CM | POA: Diagnosis not present

## 2024-02-14 DIAGNOSIS — C182 Malignant neoplasm of ascending colon: Secondary | ICD-10-CM | POA: Diagnosis not present

## 2024-02-14 DIAGNOSIS — Z95828 Presence of other vascular implants and grafts: Secondary | ICD-10-CM

## 2024-02-14 DIAGNOSIS — Z5111 Encounter for antineoplastic chemotherapy: Secondary | ICD-10-CM | POA: Diagnosis not present

## 2024-02-14 DIAGNOSIS — C189 Malignant neoplasm of colon, unspecified: Secondary | ICD-10-CM

## 2024-02-14 DIAGNOSIS — E785 Hyperlipidemia, unspecified: Secondary | ICD-10-CM | POA: Diagnosis not present

## 2024-02-14 DIAGNOSIS — K7460 Unspecified cirrhosis of liver: Secondary | ICD-10-CM | POA: Diagnosis not present

## 2024-02-14 DIAGNOSIS — K219 Gastro-esophageal reflux disease without esophagitis: Secondary | ICD-10-CM | POA: Diagnosis not present

## 2024-02-14 DIAGNOSIS — D509 Iron deficiency anemia, unspecified: Secondary | ICD-10-CM | POA: Diagnosis not present

## 2024-02-14 DIAGNOSIS — K766 Portal hypertension: Secondary | ICD-10-CM | POA: Diagnosis not present

## 2024-02-14 DIAGNOSIS — K648 Other hemorrhoids: Secondary | ICD-10-CM | POA: Diagnosis not present

## 2024-02-14 DIAGNOSIS — I10 Essential (primary) hypertension: Secondary | ICD-10-CM | POA: Diagnosis not present

## 2024-02-14 MED ORDER — PEGFILGRASTIM-CBQV 6 MG/0.6ML ~~LOC~~ SOSY
6.0000 mg | PREFILLED_SYRINGE | Freq: Once | SUBCUTANEOUS | Status: AC
  Administered 2024-02-14: 6 mg via SUBCUTANEOUS
  Filled 2024-02-14: qty 0.6

## 2024-02-14 MED ORDER — HEPARIN SOD (PORK) LOCK FLUSH 100 UNIT/ML IV SOLN
500.0000 [IU] | Freq: Once | INTRAVENOUS | Status: AC
  Administered 2024-02-14: 500 [IU] via INTRAVENOUS

## 2024-02-14 MED ORDER — SODIUM CHLORIDE 0.9% FLUSH
10.0000 mL | Freq: Once | INTRAVENOUS | Status: AC
  Administered 2024-02-14: 10 mL via INTRAVENOUS

## 2024-02-14 NOTE — Patient Instructions (Signed)

## 2024-02-19 ENCOUNTER — Encounter: Payer: Self-pay | Admitting: Oncology

## 2024-02-22 ENCOUNTER — Other Ambulatory Visit (HOSPITAL_BASED_OUTPATIENT_CLINIC_OR_DEPARTMENT_OTHER): Payer: Self-pay

## 2024-02-24 DIAGNOSIS — C182 Malignant neoplasm of ascending colon: Secondary | ICD-10-CM | POA: Diagnosis not present

## 2024-02-28 ENCOUNTER — Encounter: Payer: Self-pay | Admitting: Oncology

## 2024-03-03 ENCOUNTER — Encounter: Payer: Self-pay | Admitting: Oncology

## 2024-03-03 ENCOUNTER — Other Ambulatory Visit (HOSPITAL_BASED_OUTPATIENT_CLINIC_OR_DEPARTMENT_OTHER): Payer: Self-pay

## 2024-03-04 ENCOUNTER — Other Ambulatory Visit (HOSPITAL_BASED_OUTPATIENT_CLINIC_OR_DEPARTMENT_OTHER): Payer: Self-pay

## 2024-03-04 ENCOUNTER — Other Ambulatory Visit: Payer: Self-pay

## 2024-03-04 ENCOUNTER — Other Ambulatory Visit

## 2024-03-04 ENCOUNTER — Inpatient Hospital Stay

## 2024-03-04 ENCOUNTER — Inpatient Hospital Stay: Attending: Oncology | Admitting: Nurse Practitioner

## 2024-03-04 ENCOUNTER — Encounter: Payer: Self-pay | Admitting: Nurse Practitioner

## 2024-03-04 VITALS — BP 133/80 | HR 96 | Temp 98.6°F | Resp 18 | Ht 66.0 in | Wt 249.5 lb

## 2024-03-04 DIAGNOSIS — Z5111 Encounter for antineoplastic chemotherapy: Secondary | ICD-10-CM | POA: Insufficient documentation

## 2024-03-04 DIAGNOSIS — D509 Iron deficiency anemia, unspecified: Secondary | ICD-10-CM | POA: Insufficient documentation

## 2024-03-04 DIAGNOSIS — C182 Malignant neoplasm of ascending colon: Secondary | ICD-10-CM | POA: Diagnosis not present

## 2024-03-04 DIAGNOSIS — Z79899 Other long term (current) drug therapy: Secondary | ICD-10-CM | POA: Insufficient documentation

## 2024-03-04 DIAGNOSIS — F32A Depression, unspecified: Secondary | ICD-10-CM | POA: Diagnosis not present

## 2024-03-04 DIAGNOSIS — K146 Glossodynia: Secondary | ICD-10-CM

## 2024-03-04 DIAGNOSIS — K219 Gastro-esophageal reflux disease without esophagitis: Secondary | ICD-10-CM | POA: Diagnosis not present

## 2024-03-04 DIAGNOSIS — C189 Malignant neoplasm of colon, unspecified: Secondary | ICD-10-CM

## 2024-03-04 DIAGNOSIS — F410 Panic disorder [episodic paroxysmal anxiety] without agoraphobia: Secondary | ICD-10-CM | POA: Diagnosis not present

## 2024-03-04 DIAGNOSIS — E785 Hyperlipidemia, unspecified: Secondary | ICD-10-CM | POA: Diagnosis not present

## 2024-03-04 DIAGNOSIS — C787 Secondary malignant neoplasm of liver and intrahepatic bile duct: Secondary | ICD-10-CM

## 2024-03-04 DIAGNOSIS — I10 Essential (primary) hypertension: Secondary | ICD-10-CM | POA: Diagnosis not present

## 2024-03-04 LAB — CBC WITH DIFFERENTIAL (CANCER CENTER ONLY)
Abs Immature Granulocytes: 0.04 K/uL (ref 0.00–0.07)
Basophils Absolute: 0.1 K/uL (ref 0.0–0.1)
Basophils Relative: 1 %
Eosinophils Absolute: 0.2 K/uL (ref 0.0–0.5)
Eosinophils Relative: 2 %
HCT: 37.1 % — ABNORMAL LOW (ref 39.0–52.0)
Hemoglobin: 10.9 g/dL — ABNORMAL LOW (ref 13.0–17.0)
Immature Granulocytes: 1 %
Lymphocytes Relative: 14 %
Lymphs Abs: 1.2 K/uL (ref 0.7–4.0)
MCH: 23.2 pg — ABNORMAL LOW (ref 26.0–34.0)
MCHC: 29.4 g/dL — ABNORMAL LOW (ref 30.0–36.0)
MCV: 79.1 fL — ABNORMAL LOW (ref 80.0–100.0)
Monocytes Absolute: 0.6 K/uL (ref 0.1–1.0)
Monocytes Relative: 8 %
Neutro Abs: 6.1 K/uL (ref 1.7–7.7)
Neutrophils Relative %: 74 %
Platelet Count: 210 K/uL (ref 150–400)
RBC: 4.69 MIL/uL (ref 4.22–5.81)
RDW: 19.9 % — ABNORMAL HIGH (ref 11.5–15.5)
WBC Count: 8.2 K/uL (ref 4.0–10.5)
nRBC: 0 % (ref 0.0–0.2)

## 2024-03-04 LAB — CMP (CANCER CENTER ONLY)
ALT: 34 U/L (ref 0–44)
AST: 37 U/L (ref 15–41)
Albumin: 4.1 g/dL (ref 3.5–5.0)
Alkaline Phosphatase: 180 U/L — ABNORMAL HIGH (ref 38–126)
Anion gap: 12 (ref 5–15)
BUN: 12 mg/dL (ref 6–20)
CO2: 23 mmol/L (ref 22–32)
Calcium: 9.3 mg/dL (ref 8.9–10.3)
Chloride: 102 mmol/L (ref 98–111)
Creatinine: 0.89 mg/dL (ref 0.61–1.24)
GFR, Estimated: >60 mL/min (ref >60)
Glucose, Bld: 170 mg/dL — ABNORMAL HIGH (ref 70–99)
Potassium: 3.9 mmol/L (ref 3.5–5.1)
Sodium: 137 mmol/L (ref 135–145)
Total Bilirubin: 0.8 mg/dL (ref 0.0–1.2)
Total Protein: 7.8 g/dL (ref 6.5–8.1)

## 2024-03-04 LAB — CEA (ACCESS): CEA (CHCC): 24.05 ng/mL — ABNORMAL HIGH (ref 0.00–5.00)

## 2024-03-04 LAB — TOTAL PROTEIN, URINE DIPSTICK: Protein, ur: NEGATIVE mg/dL

## 2024-03-04 MED ORDER — NYSTATIN 100000 UNIT/ML MT SUSP
5.0000 mL | Freq: Four times a day (QID) | OROMUCOSAL | 0 refills | Status: DC
  Filled 2024-03-04: qty 240, 12d supply, fill #0

## 2024-03-04 MED ORDER — TRIAMCINOLONE ACETONIDE 0.1 % MT PSTE: PASTE | Freq: Two times a day (BID) | OROMUCOSAL | 1 refills | Status: AC | PRN

## 2024-03-04 MED ORDER — FLUCONAZOLE 100 MG PO TABS: 100.0000 mg | ORAL_TABLET | Freq: Every day | ORAL | 0 refills | Status: AC

## 2024-03-04 MED ORDER — MAGIC MOUTHWASH: 5.0000 mL | Freq: Four times a day (QID) | ORAL | 1 refills | Status: DC | PRN

## 2024-03-04 MED ORDER — NYSTATIN 100000 UNIT/ML MT SUSP
5.0000 mL | Freq: Four times a day (QID) | OROMUCOSAL | 0 refills | Status: DC
  Filled 2024-03-05 – 2024-03-08 (×2): qty 240, 12d supply, fill #0

## 2024-03-04 NOTE — Progress Notes (Signed)
 [name] OFFICE PROGRESS NOTE   Diagnosis: Colon cancer  INTERVAL HISTORY:   [name] returns as scheduled.  He completed another cycle of FOLFIRI/Avastin  02/12/2024.  He has had nausea for the past 3 weeks, most significant today.  He has not vomited but has been close.  Occasional loose stools.  He has severe burning on his tongue.  This was present prior to the most recent cycle of chemotherapy and has persisted.  No ulcers.  It hurts to eat solid food.  He is tolerating water and chocolate milk.  He denies bleeding.  He has achy bilateral shoulder pain and left lower back pain which moves to the right side.  He does not want to take chemotherapy today.  Objective:  Vital signs in last 24 hours:  Blood pressure 133/80, pulse 96, temperature 98.6 F (37 C), temperature source Temporal, resp. rate 18, height 5' 6 (1.676 m), weight 249 lb 8 oz (113.2 kg), SpO2 98%.    HEENT: Mild white coating on tongue.  No ulcers. Resp: Lungs clear bilaterally. Cardio: Regular rate and rhythm. GI: Abdomen is soft and nontender.  No hepatosplenomegaly. Vascular: No leg edema. Skin: Palms without erythema. Port-A-Cath without erythema.  Lab Results:  Lab Results  Component Value Date   WBC 8.2 03/04/2024   HGB 10.9 (L) 03/04/2024   HCT 37.1 (L) 03/04/2024   MCV 79.1 (L) 03/04/2024   PLT 210 03/04/2024   NEUTROABS 6.1 03/04/2024    Imaging:  No results found.  Medications: I have reviewed the patient's current medications.  Assessment/Plan: Moderately differentiated adenocarcinoma of the ascending colon, stage IIb (T4aN0), status post a right colectomy 04/12/2020 Tumor invades the visceral peritoneum, 0/19 lymph nodes, no lymphovascular or perineural invasion, no tumor deposits, MSS, no loss of mismatch repair protein expression Cycle 1 adjuvant Xeloda  05/17/2020 Cycle 2 adjuvant Xeloda  06/07/2020, Xeloda  discontinued after 12 days secondary to nausea and rectal  bleeding Cycle 3 adjuvant Xeloda  06/28/2020, dose reduced to 2000 mg a.m., 1500 mg p.m. secondary to nausea (patient discontinued Xeloda  on day 11 due to colonoscopy) Colonoscopy 07/09/2020-nodular mucosa at the colonic anastomosis, biopsied; patent end-to-side ileocolonic anastomosis characterized by healthy-appearing mucosa and visible sutures; nonbleeding internal hemorrhoids, biopsy with benign ulcerated anastomotic mucosa with granulation tissue Cycle 4 adjuvant Xeloda  07/19/2020, 2000 mg every morning and 1500 mg every afternoon Cycle 5 adjuvant Xeloda  08/09/2020 Cycle 6 adjuvant Xeloda  08/31/2019 Cycle 7 adjuvant Xeloda  09/20/2020 Cycle 8 adjuvant Xeloda  10/11/2020 CTs 03/07/2021-no evidence of recurrent disease, hepatic steatosis, slight wall thickening at the junction of the descending and horizontal duodenum Mild elevation of CEA 2023 12/01/2021-Guardant Reveal-ct DNA detected PET 12/30/2021-hypermetabolic right liver lesion, hypermetabolic area in a loop of mid small bowel with an SUV of 12.5, review of PET images at GI tumor conference 01/11/2022-small bowel uptake felt to be a benign finding MRI liver 01/10/2022-solitary 1.2 cm right liver lesion between segments 7 and 8 compatible with metastasis, subtle changes suggesting cirrhosis, hepatic steatosis CT enteroscopy 02/02/2022-small bowel loop with hypermetabolic activity on PET continues to have a bandlike density along the margin felt to represent a diverticulum or adjacent nodal tissue.  Small bowel tumor not excluded.  3-4 mm left omental nodule 02/08/2022-biopsy and ablation of solitary liver lesion, adenocarcinoma consistent with metastatic colorectal adenocarcinoma CTs 03/31/2022-unchanged circumstantial soft tissue thickening surrounding a loop of mid to distal small bowel with an adjacent nodular focus measuring 1.2 cm.,  Ablation cavity in segment 7, no other evidence of metastatic disease Diagnostic  laparoscopy 05/26/2022-mid small bowel  mass-resected, well to moderately differentiated adenocarcinoma, 0/2 lymph nodes, morphology consistent with a colon primary, tumor involved full-thickness including serosa with perineural and large vessel involvement; foundation 1-microsatellite stable, tumor mutation burden 2, K-ras Q61H CT abdomen/pelvis 07/10/2022-the right liver ablation defect is smaller, new subtle right liver lesion measuring 13 x 9 mm, enlarged lymph node in the right upper quadrant mesentery adjacent to surgical anastomosis PET 07/26/2022-new segment 6 liver lesion, enlarging hypermetabolic left omental lesion, there is another mildly hypermetabolic peritoneal implant, 7 mm omental nodule at the midline without hypermetabolism Cycle 1 FOLFOX 09/25/2022 Cycle 2 FOLFOX/bevacizumab  10/09/2022, 5-FU dose reduced secondary to mucositis Cycle 3 FOLFOX/bevacizumab  10/23/2022 Cycle 4 FOLFOX/bevacizumab  11/06/2022 Cycle 5 FOLFOX/bevacizumab  11/20/2022 Cycle 6 FOLFOX/bevacizumab  12/04/2022 CT abdomen/pelvis 12/14/2022-further contraction of the treated segment 7 lesion, slight enlargement of segment 6 lesion, no new liver lesion, increased size of ileocolic nodes and an omental lesion, stable small retroperitoneal nodes Cycle 7 FOLFOX/bevacizumab  12/18/2022 Cycle 8 FOLFOX/bevacizumab  01/02/2023, oxaliplatin  dose reduced secondary to prolonged nausea and neuropathy symptoms, Emend added Cycle 9 FOLFOX/bevacizumab  01/16/2023, oxaliplatin  discontinued secondary to neuropathy Cycle 1 FOLFIRI 02/19/2023, Avastin  held due to recent dental extractions Cycle 2 FOLFIRI/Avastin  03/05/2023 Cycle 3 FOLFIRI/Avastin  03/19/2023, 5-FU bolus held due to mucositis after cycle 2, Fulphila  added Cycle 4 FOLFIRI/Avastin  04/02/2023, Fulphila , Emend Cycle 5 FOLFIRI/Avastin  04/16/2023, Fulphila , Emend CTs 05/02/2023-similar ileocolic mesenteric adenopathy and omental metastasis, cirrhosis with portal hypertension, stable liver lesions, no new or progressive disease Cycle 6  FOLFIRI/Avastin  05/07/2023, Fulphila , Emend Cycle 7 FOLFIRI/Avastin  05/21/2023, Fulphila , Emend Cycle 8 FOLFIRI 06/04/2023, Fulphila , Emend; Avastin  held Cycle 9 FOLFIRI 06/18/2023, Fulphila , Emend, Avastin  held Treatment held 07/09/2023 patient request Cycle 10 FOLFIRI 07/18/2023, Fulphila , Emend, Avastin  held CTs 07/31/2023: Stable with unchanged hepatic lesions, mesenteric lymph nodes, and left omental nodule, unchanged morphologic changes of cirrhosis and portal hypertension Cycle 11 FOLFIRI/Avastin  08/01/2023, Fulphila  Chemotherapy held 08/15/2023 per patient request Cycle 12 FOLFIRI/Avastin  08/30/2023, Udenyca  Cycle 13 FOLFIRI/Avastin  09/19/2023, Udenyca  Cycle 14 FOLFIRI/Avastin  10/01/2023, Udenyca  Cycle 15 FOLFIRI/Avastin  10/22/2023, Udenyca  11/05/2023 treatment held per patient request due to dental issue Cycle 16 FOLFIRI 11/19/2023, Udenyca , Avastin  held secondary to planned dental extractions CTs 11/28/2023: No evidence of progressive disease, stable hepatic, left upper quadrant omental, and ileocolic mesenteric lesions Cycle 17 FOLFIRI/Avastin  12/10/2023 Cycle 18 FOLFIRI 12/31/2023, Avastin  held secondary to 12/27/2023 tooth extraction Cycle 19 FOLFIRI/Avastin  01/22/2024 Cycle 20 FOLFIRI/Avastin  02/12/2024 Microcytic anemia secondary to #1-improved Colon polyps-sessile serrated adenoma of the descending colon, tubular adenomas with focal high-grade dysplasia of the transverse colon on colonoscopy 02/12/2020, tubular adenoma of the mid ascending colon on the surgical specimen 04/12/2020 Family history of colon cancer Depression Anxiety/panic attacks Hypertension Hyperlipidemia Nausea secondary to Xeloda ?-Resolved Gastroesophageal reflux disease-improved following discontinuation of Xeloda  Anemia, microcytic; ferritin 6 01/24/2022; stool positive for blood x3 01/25/2022; 02/02/2022 CT abdomen/pelvis enterography-bowel loop demonstrating hypermetabolic activity continues to have a bandlike density along its  margin possibly representing a diverticulum or adjacent nodal tissue, difficult to exclude small bowel tumor, 3 x 4 mm left omental nodule or lymph node, known right hepatic lobe tumor is occult on CT. Venofer  weekly x3 beginning 03/03/2022 Negative capsule endoscopy 03/09/2022 Venofer  03/03/2022 for 3 weekly doses Venofer  05/03/2022, 05/19/2022 Venofer  07/28/2022, 08/04/2022 Venofer  03/22/2023, 03/29/2023, 04/04/2023 Venofer  06/18/2023, 07/09/2023 07/03/2023 upper endoscopy and colonoscopy-severe portal hypertensive gastropathy in the gastric fundus, gastric body, stomach, red blood found greater curvature of the stomach and gastric antrum, treated with APC; colonoscopy showed dark old maroon-colored blood in the entire colon.  No  evidence of active or recent bleeding in the colon. 12.  Anterior left neck nodule 03/17/2022-cyst?,  Lymph node? 13.  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  Oxaliplatin  neuropathy-mild loss of vibratory sense 12/04/2022, 12/18/2022, 01/02/2023    Disposition: [name] appears stable.  He has completed 20 cycles of FOLFIRI/Avastin .  He is having persistent nausea and significant tongue soreness.  He declines chemotherapy today.  We decided to go ahead with restaging CT scans later this week.  He will return for follow-up in 1 week to review results.  Magic mouthwash and Kenalog  paste refills will be sent to his pharmacy.  He will complete a 4-day course of Diflucan  to see if the tongue soreness improves.    [name] ANP/GNP-BC   03/04/2024  10:44 AM

## 2024-03-04 NOTE — Addendum Note (Signed)
 Addended by: DYANA CAMELIA CROME on: 03/04/2024 02:13 PM   Modules accepted: Orders

## 2024-03-04 NOTE — Addendum Note (Signed)
 Addended by: DYANA CAMELIA CROME on: 03/04/2024 12:13 PM   Modules accepted: Orders

## 2024-03-04 NOTE — Progress Notes (Addendum)
 Scheduled CT scan on 03/08/24 at 4 pm. Arrive at 2 pm, [facility] Radiology location. Patient made aware, understood. No further questions.

## 2024-03-04 NOTE — Progress Notes (Signed)
 Patient seen by Lonna Cobb NP today  Vitals are within treatment parameters:Yes   Labs are within treatment parameters: Yes   Treatment plan has been signed: Yes   Per physician team, Patient will not be receiving treatment today.

## 2024-03-05 ENCOUNTER — Encounter: Payer: Self-pay | Admitting: Oncology

## 2024-03-05 ENCOUNTER — Encounter: Payer: Self-pay | Admitting: Nurse Practitioner

## 2024-03-05 ENCOUNTER — Other Ambulatory Visit (HOSPITAL_BASED_OUTPATIENT_CLINIC_OR_DEPARTMENT_OTHER): Payer: Self-pay

## 2024-03-05 ENCOUNTER — Other Ambulatory Visit: Payer: Self-pay

## 2024-03-06 ENCOUNTER — Inpatient Hospital Stay

## 2024-03-07 ENCOUNTER — Other Ambulatory Visit: Payer: Self-pay

## 2024-03-07 ENCOUNTER — Encounter: Payer: Self-pay | Admitting: Oncology

## 2024-03-08 ENCOUNTER — Ambulatory Visit (HOSPITAL_BASED_OUTPATIENT_CLINIC_OR_DEPARTMENT_OTHER)
Admission: RE | Admit: 2024-03-08 | Discharge: 2024-03-08 | Disposition: A | Discharge status: Home / Self Care | Source: Ambulatory Visit | Attending: Nurse Practitioner | Admitting: Nurse Practitioner

## 2024-03-08 ENCOUNTER — Other Ambulatory Visit (HOSPITAL_BASED_OUTPATIENT_CLINIC_OR_DEPARTMENT_OTHER): Payer: Self-pay

## 2024-03-08 DIAGNOSIS — K146 Glossodynia: Secondary | ICD-10-CM | POA: Insufficient documentation

## 2024-03-08 DIAGNOSIS — C189 Malignant neoplasm of colon, unspecified: Secondary | ICD-10-CM | POA: Insufficient documentation

## 2024-03-08 DIAGNOSIS — K766 Portal hypertension: Secondary | ICD-10-CM | POA: Diagnosis not present

## 2024-03-08 DIAGNOSIS — C787 Secondary malignant neoplasm of liver and intrahepatic bile duct: Secondary | ICD-10-CM | POA: Insufficient documentation

## 2024-03-08 DIAGNOSIS — R161 Splenomegaly, not elsewhere classified: Secondary | ICD-10-CM | POA: Diagnosis not present

## 2024-03-08 DIAGNOSIS — K7460 Unspecified cirrhosis of liver: Secondary | ICD-10-CM | POA: Diagnosis not present

## 2024-03-08 MED ORDER — IOHEXOL 300 MG/ML  SOLN
100.0000 mL | Freq: Once | INTRAMUSCULAR | Status: AC | PRN
  Administered 2024-03-08: 100 mL via INTRAVENOUS

## 2024-03-10 ENCOUNTER — Inpatient Hospital Stay (HOSPITAL_BASED_OUTPATIENT_CLINIC_OR_DEPARTMENT_OTHER): Admitting: Nurse Practitioner

## 2024-03-10 ENCOUNTER — Encounter: Payer: Self-pay | Admitting: Oncology

## 2024-03-10 VITALS — BP 133/72 | HR 87 | Temp 98.0°F | Resp 18 | Ht 66.0 in | Wt 248.3 lb

## 2024-03-10 DIAGNOSIS — C787 Secondary malignant neoplasm of liver and intrahepatic bile duct: Secondary | ICD-10-CM | POA: Diagnosis not present

## 2024-03-10 DIAGNOSIS — D509 Iron deficiency anemia, unspecified: Secondary | ICD-10-CM | POA: Diagnosis not present

## 2024-03-10 DIAGNOSIS — C189 Malignant neoplasm of colon, unspecified: Secondary | ICD-10-CM

## 2024-03-10 DIAGNOSIS — Z79899 Other long term (current) drug therapy: Secondary | ICD-10-CM | POA: Diagnosis not present

## 2024-03-10 DIAGNOSIS — K219 Gastro-esophageal reflux disease without esophagitis: Secondary | ICD-10-CM | POA: Diagnosis not present

## 2024-03-10 DIAGNOSIS — E785 Hyperlipidemia, unspecified: Secondary | ICD-10-CM | POA: Diagnosis not present

## 2024-03-10 DIAGNOSIS — C182 Malignant neoplasm of ascending colon: Secondary | ICD-10-CM | POA: Diagnosis not present

## 2024-03-10 DIAGNOSIS — I10 Essential (primary) hypertension: Secondary | ICD-10-CM | POA: Diagnosis not present

## 2024-03-10 DIAGNOSIS — Z5111 Encounter for antineoplastic chemotherapy: Secondary | ICD-10-CM | POA: Diagnosis not present

## 2024-03-10 NOTE — Progress Notes (Signed)
 [facility] OFFICE PROGRESS NOTE   Diagnosis: Colon cancer  INTERVAL HISTORY:   Mr. Meador completed another cycle of FOLFIRI/bevacizumab  on 02/12/2024.  He reports increased nausea, mouth soreness, and bone pain following this cycle of chemotherapy.  The symptoms have improved.  He feels well at present.  No bleeding or symptom of thrombosis.    Objective:  Vital signs in last 24 hours:  Blood pressure 133/72, pulse 87, temperature 98 F (36.7 C), temperature source Temporal, resp. rate 18, height 5' 6 (1.676 m), weight 248 lb 4.8 oz (112.6 kg), SpO2 98%.    HEENT: No thrush or ulcers Resp: Lungs clear bilaterally Cardio: Regular rate and rhythm GI: No mass, nontender, no hepatosplenomegaly Vascular: No leg edema   Portacath/PICC-without erythema  Lab Results:  Lab Results  Component Value Date   WBC 8.2 03/04/2024   HGB 10.9 (L) 03/04/2024   HCT 37.1 (L) 03/04/2024   MCV 79.1 (L) 03/04/2024   PLT 210 03/04/2024   NEUTROABS 6.1 03/04/2024    CMP  Lab Results  Component Value Date   NA 137 03/04/2024   K 3.9 03/04/2024   CL 102 03/04/2024   CO2 23 03/04/2024   GLUCOSE 170 (H) 03/04/2024   BUN 12 03/04/2024   CREATININE 0.89 03/04/2024   CALCIUM  9.3 03/04/2024   PROT 7.8 03/04/2024   ALBUMIN 4.1 03/04/2024   AST 37 03/04/2024   ALT 34 03/04/2024   ALKPHOS 180 (H) 03/04/2024   BILITOT 0.8 03/04/2024   GFRNONAA >60 03/04/2024   GFRAA >60 05/10/2020    Lab Results  Component Value Date   CEA1 1.08 03/07/2021   CEA 24.05 (H) 03/04/2024    Imaging:  CT CHEST ABDOMEN PELVIS W CONTRAST Result Date: 03/09/2024 CLINICAL DATA:  Follow-up metastatic colon carcinoma. Restaging. Cirrhosis. * Tracking Code: BO * EXAM: CT CHEST, ABDOMEN, AND PELVIS WITH CONTRAST TECHNIQUE: Multidetector CT imaging of the chest, abdomen and pelvis was performed following the standard protocol during bolus administration of intravenous contrast. RADIATION DOSE  REDUCTION: This exam was performed according to the departmental dose-optimization program which includes automated exposure control, adjustment of the mA and/or kV according to patient size and/or use of iterative reconstruction technique. CONTRAST:  OMNIPAQUE  IOHEXOL  300 MG/ML  SOLN COMPARISON:  11/28/2023 FINDINGS: CT CHEST FINDINGS Cardiovascular: No acute findings. Mediastinum/Lymph Nodes: No masses or pathologically enlarged lymph nodes identified. Lungs/Pleura: No suspicious pulmonary nodules or masses identified. No evidence of infiltrate or pleural effusion. Musculoskeletal:  No suspicious bone lesions identified. CT ABDOMEN AND PELVIS FINDINGS Hepatobiliary: Cirrhosis again demonstrated. Recanalization of paraumbilical veins again seen consistent with portal venous hypertension. 2.0 x 1.2 cm low-attenuation lesion in the right hepatic lobe with adjacent capsular scarring remains stable. Another low-attenuation lesion in the inferior right hepatic lobe measuring 1.7 x 1 4 cm remains stable. 10 x 10 mm low-attenuation lesion in segment 4 B is also stable. No new or enlarging hepatic lesions identified. Gallbladder is unremarkable. No evidence of biliary ductal dilatation. Pancreas:  No mass or inflammatory changes. Spleen: Stable splenomegaly, consistent with portal venous hypertension. No splenic masses identified. Adrenals/Urinary tract: No suspicious masses or hydronephrosis. Stomach/Bowel: No evidence of obstruction, inflammatory process, or abnormal fluid collections. 2.5 x 2.1 cm soft tissue mass in the left omentum is stable, consistent with metastatic disease. No evidence of ascites. Vascular/Lymphatic: Peritoneal soft tissue nodules or lymph nodes in the right abdominal mesentery measuring up to 1.4 cm, also unchanged and consistent with metastatic disease. No  acute vascular findings. Reproductive:  No mass or other significant abnormality identified. Other:  None. Musculoskeletal:  No  suspicious bone lesions identified. IMPRESSION: Stable small low-attenuation liver lesions, consistent with treated metastases. Stable peritoneal soft tissue nodules or lymph nodes in the right abdominal mesentery, consistent with metastatic disease. Stable left omental soft tissue mass, consistent with metastatic disease. No new or progressive metastatic disease. No thoracic metastatic disease. Cirrhosis and findings of portal venous hypertension, including splenomegaly and recanalization of paraumbilical veins. Electronically Signed   By: Norleen DELENA Kil M.D.   On: 03/09/2024 14:22    Medications: I have reviewed the patient's current medications.   Assessment/Plan:  Moderately differentiated adenocarcinoma of the ascending colon, stage IIb (T4aN0), status post a right colectomy 04/12/2020 Tumor invades the visceral peritoneum, 0/19 lymph nodes, no lymphovascular or perineural invasion, no tumor deposits, MSS, no loss of mismatch repair protein expression Cycle 1 adjuvant Xeloda  05/17/2020 Cycle 2 adjuvant Xeloda  06/07/2020, Xeloda  discontinued after 12 days secondary to nausea and rectal bleeding Cycle 3 adjuvant Xeloda  06/28/2020, dose reduced to 2000 mg a.m., 1500 mg p.m. secondary to nausea (patient discontinued Xeloda  on day 11 due to colonoscopy) Colonoscopy 07/09/2020-nodular mucosa at the colonic anastomosis, biopsied; patent end-to-side ileocolonic anastomosis characterized by healthy-appearing mucosa and visible sutures; nonbleeding internal hemorrhoids, biopsy with benign ulcerated anastomotic mucosa with granulation tissue Cycle 4 adjuvant Xeloda  07/19/2020, 2000 mg every morning and 1500 mg every afternoon Cycle 5 adjuvant Xeloda  08/09/2020 Cycle 6 adjuvant Xeloda  08/31/2019 Cycle 7 adjuvant Xeloda  09/20/2020 Cycle 8 adjuvant Xeloda  10/11/2020 CTs 03/07/2021-no evidence of recurrent disease, hepatic steatosis, slight wall thickening at the junction of the descending and horizontal  duodenum Mild elevation of CEA 2023 12/01/2021-Guardant Reveal-ct DNA detected PET 12/30/2021-hypermetabolic right liver lesion, hypermetabolic area in a loop of mid small bowel with an SUV of 12.5, review of PET images at GI tumor conference 01/11/2022-small bowel uptake felt to be a benign finding MRI liver 01/10/2022-solitary 1.2 cm right liver lesion between segments 7 and 8 compatible with metastasis, subtle changes suggesting cirrhosis, hepatic steatosis CT enteroscopy 02/02/2022-small bowel loop with hypermetabolic activity on PET continues to have a bandlike density along the margin felt to represent a diverticulum or adjacent nodal tissue.  Small bowel tumor not excluded.  3-4 mm left omental nodule 02/08/2022-biopsy and ablation of solitary liver lesion, adenocarcinoma consistent with metastatic colorectal adenocarcinoma CTs 03/31/2022-unchanged circumstantial soft tissue thickening surrounding a loop of mid to distal small bowel with an adjacent nodular focus measuring 1.2 cm.,  Ablation cavity in segment 7, no other evidence of metastatic disease Diagnostic laparoscopy 05/26/2022-mid small bowel mass-resected, well to moderately differentiated adenocarcinoma, 0/2 lymph nodes, morphology consistent with a colon primary, tumor involved full-thickness including serosa with perineural and large vessel involvement; foundation 1-microsatellite stable, tumor mutation burden 2, K-ras Q61H CT abdomen/pelvis 07/10/2022-the right liver ablation defect is smaller, new subtle right liver lesion measuring 13 x 9 mm, enlarged lymph node in the right upper quadrant mesentery adjacent to surgical anastomosis PET 07/26/2022-new segment 6 liver lesion, enlarging hypermetabolic left omental lesion, there is another mildly hypermetabolic peritoneal implant, 7 mm omental nodule at the midline without hypermetabolism Cycle 1 FOLFOX 09/25/2022 Cycle 2 FOLFOX/bevacizumab  10/09/2022, 5-FU dose reduced secondary to mucositis Cycle  3 FOLFOX/bevacizumab  10/23/2022 Cycle 4 FOLFOX/bevacizumab  11/06/2022 Cycle 5 FOLFOX/bevacizumab  11/20/2022 Cycle 6 FOLFOX/bevacizumab  12/04/2022 CT abdomen/pelvis 12/14/2022-further contraction of the treated segment 7 lesion, slight enlargement of segment 6 lesion, no new liver lesion, increased size of ileocolic nodes and an omental lesion,  stable small retroperitoneal nodes Cycle 7 FOLFOX/bevacizumab  12/18/2022 Cycle 8 FOLFOX/bevacizumab  01/02/2023, oxaliplatin  dose reduced secondary to prolonged nausea and neuropathy symptoms, Emend added Cycle 9 FOLFOX/bevacizumab  01/16/2023, oxaliplatin  discontinued secondary to neuropathy Cycle 1 FOLFIRI 02/19/2023, Avastin  held due to recent dental extractions Cycle 2 FOLFIRI/Avastin  03/05/2023 Cycle 3 FOLFIRI/Avastin  03/19/2023, 5-FU bolus held due to mucositis after cycle 2, Fulphila  added Cycle 4 FOLFIRI/Avastin  04/02/2023, Fulphila , Emend Cycle 5 FOLFIRI/Avastin  04/16/2023, Fulphila , Emend CTs 05/02/2023-similar ileocolic mesenteric adenopathy and omental metastasis, cirrhosis with portal hypertension, stable liver lesions, no new or progressive disease Cycle 6 FOLFIRI/Avastin  05/07/2023, Fulphila , Emend Cycle 7 FOLFIRI/Avastin  05/21/2023, Fulphila , Emend Cycle 8 FOLFIRI 06/04/2023, Fulphila , Emend; Avastin  held Cycle 9 FOLFIRI 06/18/2023, Fulphila , Emend, Avastin  held Treatment held 07/09/2023 patient request Cycle 10 FOLFIRI 07/18/2023, Fulphila , Emend, Avastin  held CTs 07/31/2023: Stable with unchanged hepatic lesions, mesenteric lymph nodes, and left omental nodule, unchanged morphologic changes of cirrhosis and portal hypertension Cycle 11 FOLFIRI/Avastin  08/01/2023, Fulphila  Chemotherapy held 08/15/2023 per patient request Cycle 12 FOLFIRI/Avastin  08/30/2023, Udenyca  Cycle 13 FOLFIRI/Avastin  09/19/2023, Udenyca  Cycle 14 FOLFIRI/Avastin  10/01/2023, Udenyca  Cycle 15 FOLFIRI/Avastin  10/22/2023, Udenyca  11/05/2023 treatment held per patient request due to dental  issue Cycle 16 FOLFIRI 11/19/2023, Udenyca , Avastin  held secondary to planned dental extractions CTs 11/28/2023: No evidence of progressive disease, stable hepatic, left upper quadrant omental, and ileocolic mesenteric lesions Cycle 17 FOLFIRI/Avastin  12/10/2023 Cycle 18 FOLFIRI 12/31/2023, Avastin  held secondary to 12/27/2023 tooth extraction Cycle 19 FOLFIRI/Avastin  01/22/2024 Cycle 20 FOLFIRI/Avastin  02/12/2024 03/08/2024 CTs: Stable small liver lesions, stable peritoneal nodules, stable left omental soft tissue mass, no evidence of disease progression, cirrhosis/portal hypertension Microcytic anemia secondary to #1-improved Colon polyps-sessile serrated adenoma of the descending colon, tubular adenomas with focal high-grade dysplasia of the transverse colon on colonoscopy 02/12/2020, tubular adenoma of the mid ascending colon on the surgical specimen 04/12/2020 Family history of colon cancer Depression Anxiety/panic attacks Hypertension Hyperlipidemia Nausea secondary to Xeloda ?-Resolved Gastroesophageal reflux disease-improved following discontinuation of Xeloda  Anemia, microcytic; ferritin 6 01/24/2022; stool positive for blood x3 01/25/2022; 02/02/2022 CT abdomen/pelvis enterography-bowel loop demonstrating hypermetabolic activity continues to have a bandlike density along its margin possibly representing a diverticulum or adjacent nodal tissue, difficult to exclude small bowel tumor, 3 x 4 mm left omental nodule or lymph node, known right hepatic lobe tumor is occult on CT. Venofer  weekly x3 beginning 03/03/2022 Negative capsule endoscopy 03/09/2022 Venofer  03/03/2022 for 3 weekly doses Venofer  05/03/2022, 05/19/2022 Venofer  07/28/2022, 08/04/2022 Venofer  03/22/2023, 03/29/2023, 04/04/2023 Venofer  06/18/2023, 07/09/2023 07/03/2023 upper endoscopy and colonoscopy-severe portal hypertensive gastropathy in the gastric fundus, gastric body, stomach, red blood found greater curvature of the stomach and gastric antrum,  treated with APC; colonoscopy showed dark old maroon-colored blood in the entire colon.  No evidence of active or recent bleeding in the colon. 12.  Anterior left neck nodule 03/17/2022-cyst?,  Lymph node? 13.  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  Oxaliplatin  neuropathy-mild loss of vibratory sense 12/04/2022, 12/18/2022, 01/02/2023    Disposition: Mr. Spadafore appears stable.  He has metastatic colon cancer.  He has been maintained on treatment with FOLFIRI/bevacizumab  for the past year.  There is no clinical or radiologic evidence of disease progression.  The CEA is stable.  I reviewed the CT images and findings with him.  I recommend continuing FOLFIRI/bevacizumab .  We discussed other treatment options including  referral for a clinical trial.  We can also consider Lonsurf and fruquintinib.  He reports significant nausea following chemotherapy.  Irinotecan  will be dose reduced with the next cycle of chemotherapy.  He will return for the next cycle of chemotherapy 03/17/2024.  Arley Hof, MD  03/10/2024  3:09 PM

## 2024-03-11 ENCOUNTER — Other Ambulatory Visit: Payer: Self-pay

## 2024-03-12 ENCOUNTER — Other Ambulatory Visit: Payer: Self-pay

## 2024-03-14 ENCOUNTER — Other Ambulatory Visit: Payer: Self-pay

## 2024-03-17 ENCOUNTER — Ambulatory Visit: Payer: Self-pay | Admitting: Oncology

## 2024-03-17 ENCOUNTER — Inpatient Hospital Stay

## 2024-03-17 ENCOUNTER — Ambulatory Visit

## 2024-03-17 ENCOUNTER — Other Ambulatory Visit: Payer: Self-pay

## 2024-03-17 ENCOUNTER — Inpatient Hospital Stay: Admitting: Nutrition

## 2024-03-17 VITALS — BP 132/72 | HR 73 | Resp 18 | Wt 251.5 lb

## 2024-03-17 VITALS — BP 147/77 | HR 83 | Temp 97.9°F

## 2024-03-17 DIAGNOSIS — Z95828 Presence of other vascular implants and grafts: Secondary | ICD-10-CM

## 2024-03-17 DIAGNOSIS — I10 Essential (primary) hypertension: Secondary | ICD-10-CM | POA: Diagnosis not present

## 2024-03-17 DIAGNOSIS — C182 Malignant neoplasm of ascending colon: Secondary | ICD-10-CM | POA: Diagnosis not present

## 2024-03-17 DIAGNOSIS — C189 Malignant neoplasm of colon, unspecified: Secondary | ICD-10-CM

## 2024-03-17 DIAGNOSIS — Z5111 Encounter for antineoplastic chemotherapy: Secondary | ICD-10-CM | POA: Diagnosis not present

## 2024-03-17 DIAGNOSIS — D649 Anemia, unspecified: Secondary | ICD-10-CM

## 2024-03-17 DIAGNOSIS — Z79899 Other long term (current) drug therapy: Secondary | ICD-10-CM | POA: Diagnosis not present

## 2024-03-17 DIAGNOSIS — E785 Hyperlipidemia, unspecified: Secondary | ICD-10-CM | POA: Diagnosis not present

## 2024-03-17 DIAGNOSIS — D509 Iron deficiency anemia, unspecified: Secondary | ICD-10-CM | POA: Diagnosis not present

## 2024-03-17 DIAGNOSIS — K219 Gastro-esophageal reflux disease without esophagitis: Secondary | ICD-10-CM | POA: Diagnosis not present

## 2024-03-17 LAB — CBC WITH DIFFERENTIAL (CANCER CENTER ONLY)
Abs Immature Granulocytes: 0.05 K/uL (ref 0.00–0.07)
Basophils Absolute: 0.1 K/uL (ref 0.0–0.1)
Basophils Relative: 2 %
Eosinophils Absolute: 0.2 K/uL (ref 0.0–0.5)
Eosinophils Relative: 3 %
HCT: 32.2 % — ABNORMAL LOW (ref 39.0–52.0)
Hemoglobin: 9.4 g/dL — ABNORMAL LOW (ref 13.0–17.0)
Immature Granulocytes: 1 %
Lymphocytes Relative: 17 %
Lymphs Abs: 0.9 K/uL (ref 0.7–4.0)
MCH: 22.3 pg — ABNORMAL LOW (ref 26.0–34.0)
MCHC: 29.2 g/dL — ABNORMAL LOW (ref 30.0–36.0)
MCV: 76.5 fL — ABNORMAL LOW (ref 80.0–100.0)
Monocytes Absolute: 0.4 K/uL (ref 0.1–1.0)
Monocytes Relative: 9 %
Neutro Abs: 3.5 K/uL (ref 1.7–7.7)
Neutrophils Relative %: 68 %
Platelet Count: 169 K/uL (ref 150–400)
RBC: 4.21 MIL/uL — ABNORMAL LOW (ref 4.22–5.81)
RDW: 17.9 % — ABNORMAL HIGH (ref 11.5–15.5)
WBC Count: 5.1 K/uL (ref 4.0–10.5)
nRBC: 0 % (ref 0.0–0.2)

## 2024-03-17 LAB — CMP (CANCER CENTER ONLY)
ALT: 39 U/L (ref 0–44)
AST: 34 U/L (ref 15–41)
Albumin: 4 g/dL (ref 3.5–5.0)
Alkaline Phosphatase: 167 U/L — ABNORMAL HIGH (ref 38–126)
Anion gap: 11 (ref 5–15)
BUN: 11 mg/dL (ref 6–20)
CO2: 25 mmol/L (ref 22–32)
Calcium: 8.9 mg/dL (ref 8.9–10.3)
Chloride: 101 mmol/L (ref 98–111)
Creatinine: 0.83 mg/dL (ref 0.61–1.24)
GFR, Estimated: >60 mL/min (ref >60)
Glucose, Bld: 219 mg/dL — ABNORMAL HIGH (ref 70–99)
Potassium: 3.7 mmol/L (ref 3.5–5.1)
Sodium: 137 mmol/L (ref 135–145)
Total Bilirubin: 0.6 mg/dL (ref 0.0–1.2)
Total Protein: 7.4 g/dL (ref 6.5–8.1)

## 2024-03-17 LAB — FERRITIN: Ferritin: 13 ng/mL — ABNORMAL LOW (ref 24–336)

## 2024-03-17 LAB — TOTAL PROTEIN, URINE DIPSTICK

## 2024-03-17 MED ORDER — ATROPINE SULFATE 1 MG/ML IV SOLN
0.5000 mg | Freq: Once | INTRAVENOUS | Status: AC | PRN
Start: 2024-03-17 — End: 2024-03-17
  Administered 2024-03-17: 0.5 mg via INTRAVENOUS
  Filled 2024-03-17: qty 1

## 2024-03-17 MED ORDER — SODIUM CHLORIDE 0.9 % IV SOLN
1600.0000 mg/m2 | INTRAVENOUS | Status: DC
  Administered 2024-03-17: 3500 mg via INTRAVENOUS
  Filled 2024-03-17: qty 70

## 2024-03-17 MED ORDER — DEXAMETHASONE SODIUM PHOSPHATE 10 MG/ML IJ SOLN
10.0000 mg | Freq: Once | INTRAMUSCULAR | Status: AC
  Administered 2024-03-17: 10 mg via INTRAVENOUS
  Filled 2024-03-17: qty 1

## 2024-03-17 MED ORDER — PALONOSETRON HCL INJECTION 0.25 MG/5ML
0.2500 mg | Freq: Once | INTRAVENOUS | Status: AC
  Administered 2024-03-17: 0.25 mg via INTRAVENOUS
  Filled 2024-03-17: qty 5

## 2024-03-17 MED ORDER — FLUOROURACIL CHEMO INJECTION 500 MG/10ML
200.0000 mg/m2 | Freq: Once | INTRAVENOUS | Status: AC
  Administered 2024-03-17: 450 mg via INTRAVENOUS
  Filled 2024-03-17: qty 9

## 2024-03-17 MED ORDER — SODIUM CHLORIDE 0.9 % IV SOLN
150.0000 mg | Freq: Once | INTRAVENOUS | Status: AC
  Administered 2024-03-17: 150 mg via INTRAVENOUS
  Filled 2024-03-17: qty 150

## 2024-03-17 MED ORDER — SODIUM CHLORIDE 0.9% FLUSH
10.0000 mL | Freq: Once | INTRAVENOUS | Status: AC
  Administered 2024-03-17: 10 mL via INTRAVENOUS

## 2024-03-17 MED ORDER — SODIUM CHLORIDE 0.9 % IV SOLN
135.0000 mg/m2 | Freq: Once | INTRAVENOUS | Status: AC
  Administered 2024-03-17: 300 mg via INTRAVENOUS
  Filled 2024-03-17: qty 15

## 2024-03-17 MED ORDER — SODIUM CHLORIDE 0.9 % IV SOLN
200.0000 mg/m2 | Freq: Once | INTRAVENOUS | Status: AC
  Administered 2024-03-17: 444 mg via INTRAVENOUS
  Filled 2024-03-17: qty 22.2

## 2024-03-17 MED ORDER — SODIUM CHLORIDE 0.9 % IV SOLN
5.0000 mg/kg | Freq: Once | INTRAVENOUS | Status: AC
  Administered 2024-03-17: 600 mg via INTRAVENOUS
  Filled 2024-03-17: qty 16

## 2024-03-17 MED ORDER — SODIUM CHLORIDE 0.9 % IV SOLN: Freq: Once | INTRAVENOUS | Status: AC

## 2024-03-17 NOTE — Telephone Encounter (Signed)
Patient gave verbal understanding and had no further question or concern. 

## 2024-03-17 NOTE — Progress Notes (Signed)
 Nutrition follow up completed with patient during infusion for metastatic colon cancer and followed by Dr. Cloretta. He has been maintained on treatment with FOLFIRI/Bevacizumab  for the past year. There is no clinical or radiologic evidence of disease progression.   Weight: 251 pounds 8 oz July 21 248 pounds 1.6 oz June 16 247 pounds 2 oz   Weight stable.  Labs include glucoses 219 July 21.  Medications include Humalog and Lantus  He was having severe nausea after chemotherapy. MD will reduce Irinotecan  this dosage. Patient struggles with controlling his blood sugars. Noted glucose ranges from 141 to 368. He likes vanilla and chocolate ensure. He does not like the taste of artificial sweeteners. He grew up on regular soft drinks and will not drink diet drinks. He states they can taste bitter. He reports he needs someone to put together sample meals for him.  Nutrition Diagnosis: Food and Nutrition Related Knowledge Deficit continues  Intervention: Provided samples of Glucerna and coupons for patient. Encouraged compliance with no concentrated sweets diet and medication to provide improved glycemic control. Reviewed eating using the plate method encouraging portion control and minimizing simple sugars. Provided sample menus and explained how he can modify to his food preferences. Encouraged him to ask PCP to refer to [facility] for additional information.   Monitoring, Evaluation, Goals: Patient will tolerate adequate calories and protein for wt maintenance and adequate glycemic control.   Follow up to be scheduled as needed.

## 2024-03-17 NOTE — Patient Instructions (Signed)
 [facility] - A DEPT OF [facility]  Discharge Instructions: Thank you for choosing [facility] to provide your oncology and hematology care.   If you have a lab appointment with the Cancer Center, please go directly to the Cancer Center and check in at the registration area.   Wear comfortable clothing and clothing appropriate for easy access to any Portacath or PICC line.   We strive to give you quality time with your provider. You may need to reschedule your appointment if you arrive late (15 or more minutes).  Arriving late affects you and other patients whose appointments are after yours.  Also, if you miss three or more appointments without notifying the office, you may be dismissed from the clinic at the provider's discretion.      For prescription refill requests, have your pharmacy contact our office and allow 72 hours for refills to be completed.    Today you received the following chemotherapy and/or immunotherapy agents: Bevacizumab  (Mavasi), Irinotecan , Leucovorin , and Fluorouracil .   To help prevent nausea and vomiting after your treatment, we encourage you to take your nausea medication as directed.  BELOW ARE SYMPTOMS THAT SHOULD BE REPORTED IMMEDIATELY: *FEVER GREATER THAN 100.4 F (38 C) OR HIGHER *CHILLS OR SWEATING *NAUSEA AND VOMITING THAT IS NOT CONTROLLED WITH YOUR NAUSEA MEDICATION *UNUSUAL SHORTNESS OF BREATH *UNUSUAL BRUISING OR BLEEDING *URINARY PROBLEMS (pain or burning when urinating, or frequent urination) *BOWEL PROBLEMS (unusual diarrhea, constipation, pain near the anus) TENDERNESS IN MOUTH AND THROAT WITH OR WITHOUT PRESENCE OF ULCERS (sore throat, sores in mouth, or a toothache) UNUSUAL RASH, SWELLING OR PAIN  UNUSUAL VAGINAL DISCHARGE OR ITCHING   Items with * indicate a potential emergency and should be followed up as soon as possible or go to the Emergency Department if any problems should occur.  Please  show the CHEMOTHERAPY ALERT CARD or IMMUNOTHERAPY ALERT CARD at check-in to the Emergency Department and triage nurse.  Should you have questions after your visit or need to cancel or reschedule your appointment, please contact [facility] - A DEPT OF [facility]  Dept: [phone]  and follow the prompts.  Office hours are 8:00 a.m. to 4:30 p.m. Monday - Friday. Please note that voicemails left after 4:00 p.m. may not be returned until the following business day.  We are closed weekends and major holidays. You have access to a nurse at all times for urgent questions. Please call the main number to the clinic Dept: [phone] and follow the prompts.   For any non-urgent questions, you may also contact your provider using MyChart. We now offer e-Visits for anyone 73 and older to request care online for non-urgent symptoms. For details visit [email or web address].   Also download the MyChart app! Go to the app store, search MyChart, open the app, select [facility], and log in with your MyChart username and password.  The chemotherapy medication bag should finish at 46 hours, 96 hours, or 7 days. For example, if your pump is scheduled for 46 hours and it was put on at 4:00 p.m., it should finish at 2:00 p.m. the day it is scheduled to come off regardless of your appointment time.     Estimated time to finish at: 1pm Wed 03/19/24   If the display on your pump reads Low Volume and it is beeping, take the batteries out of the pump and come to the cancer center for it to  be taken off.   If the pump alarms go off prior to the pump reading Low Volume then call [phone] and someone can assist you.  If the plunger comes out and the chemotherapy medication is leaking out, please use your home chemo spill kit to clean up the spill. Do NOT use paper towels or other household products.  If you have problems or questions regarding your pump, please call either  [phone] (24 hours a day) or the cancer center Monday-Friday 8:00 a.m.- 4:30 p.m. at the clinic number and we will assist you. If you are unable to get assistance, then go to the nearest Emergency Department and ask the staff to contact the IV team for assistance.

## 2024-03-17 NOTE — Patient Instructions (Signed)
 [facility] - A DEPT OF [facility]  Discharge Instructions: Thank you for choosing [facility] to provide your oncology and hematology care.   If you have a lab appointment with the Cancer Center, please go directly to the Cancer Center and check in at the registration area.   Wear comfortable clothing and clothing appropriate for easy access to any Portacath or PICC line.   We strive to give you quality time with your provider. You may need to reschedule your appointment if you arrive late (15 or more minutes).  Arriving late affects you and other patients whose appointments are after yours.  Also, if you miss three or more appointments without notifying the office, you may be dismissed from the clinic at the provider's discretion.      For prescription refill requests, have your pharmacy contact our office and allow 72 hours for refills to be completed.    Today you received the following chemotherapy and/or immunotherapy agents :  Irinotecan,  Leucovorin,  Fluorouracil.   To help prevent nausea and vomiting after your treatment, we encourage you to take your nausea medication as directed.  BELOW ARE SYMPTOMS THAT SHOULD BE REPORTED IMMEDIATELY: *FEVER GREATER THAN 100.4 F (38 C) OR HIGHER *CHILLS OR SWEATING *NAUSEA AND VOMITING THAT IS NOT CONTROLLED WITH YOUR NAUSEA MEDICATION *UNUSUAL SHORTNESS OF BREATH *UNUSUAL BRUISING OR BLEEDING *URINARY PROBLEMS (pain or burning when urinating, or frequent urination) *BOWEL PROBLEMS (unusual diarrhea, constipation, pain near the anus) TENDERNESS IN MOUTH AND THROAT WITH OR WITHOUT PRESENCE OF ULCERS (sore throat, sores in mouth, or a toothache) UNUSUAL RASH, SWELLING OR PAIN  UNUSUAL VAGINAL DISCHARGE OR ITCHING   Items with * indicate a potential emergency and should be followed up as soon as possible or go to the Emergency Department if any problems should occur.  Please show the CHEMOTHERAPY  ALERT CARD or IMMUNOTHERAPY ALERT CARD at check-in to the Emergency Department and triage nurse.  Should you have questions after your visit or need to cancel or reschedule your appointment, please contact [facility] - A DEPT OF [facility]  Dept: [phone]  and follow the prompts.  Office hours are 8:00 a.m. to 4:30 p.m. Monday - Friday. Please note that voicemails left after 4:00 p.m. may not be returned until the following business day.  We are closed weekends and major holidays. You have access to a nurse at all times for urgent questions. Please call the main number to the clinic Dept: [phone] and follow the prompts.   For any non-urgent questions, you may also contact your provider using MyChart. We now offer e-Visits for anyone 52 and older to request care online for non-urgent symptoms. For details visit [email or web address].   Also download the MyChart app! Go to the app store, search "MyChart", open the app, select [facility], and log in with your MyChart username and password.

## 2024-03-17 NOTE — Telephone Encounter (Signed)
-----   Message from Arley Hof sent at 03/17/2024  1:58 PM EDT ----- The hemoglobin and MCV are lower, and ferritin today or with next visit, we will likely need IV iron  ----- Message ----- From: Rebecka, Lab In [place] Sent: 03/17/2024  10:06 AM EDT To: Arley KATHEE Hof, MD

## 2024-03-18 ENCOUNTER — Encounter: Payer: Self-pay | Admitting: Oncology

## 2024-03-18 ENCOUNTER — Encounter: Payer: Self-pay | Admitting: Nurse Practitioner

## 2024-03-18 NOTE — Telephone Encounter (Signed)
-----   Message from Arley Hof sent at 03/17/2024  5:03 PM EDT ----- Please call patient, the iron  level is low, schedule IV iron  next 1 week  ----- Message ----- From: Rebecka, Lab In [place] Sent: 03/17/2024   3:15 PM EDT To: Arley KATHEE Hof, MD

## 2024-03-19 ENCOUNTER — Inpatient Hospital Stay

## 2024-03-19 ENCOUNTER — Telehealth: Payer: Self-pay | Admitting: *Deleted

## 2024-03-19 ENCOUNTER — Telehealth: Payer: Self-pay

## 2024-03-19 VITALS — BP 125/67 | HR 81 | Temp 98.8°F | Resp 16

## 2024-03-19 DIAGNOSIS — C787 Secondary malignant neoplasm of liver and intrahepatic bile duct: Secondary | ICD-10-CM

## 2024-03-19 DIAGNOSIS — Z79899 Other long term (current) drug therapy: Secondary | ICD-10-CM | POA: Diagnosis not present

## 2024-03-19 DIAGNOSIS — D509 Iron deficiency anemia, unspecified: Secondary | ICD-10-CM | POA: Diagnosis not present

## 2024-03-19 DIAGNOSIS — Z5111 Encounter for antineoplastic chemotherapy: Secondary | ICD-10-CM | POA: Diagnosis not present

## 2024-03-19 DIAGNOSIS — K219 Gastro-esophageal reflux disease without esophagitis: Secondary | ICD-10-CM | POA: Diagnosis not present

## 2024-03-19 DIAGNOSIS — E785 Hyperlipidemia, unspecified: Secondary | ICD-10-CM | POA: Diagnosis not present

## 2024-03-19 DIAGNOSIS — C182 Malignant neoplasm of ascending colon: Secondary | ICD-10-CM | POA: Diagnosis not present

## 2024-03-19 DIAGNOSIS — I10 Essential (primary) hypertension: Secondary | ICD-10-CM | POA: Diagnosis not present

## 2024-03-19 MED ORDER — PEGFILGRASTIM-CBQV 6 MG/0.6ML ~~LOC~~ SOSY
6.0000 mg | PREFILLED_SYRINGE | Freq: Once | SUBCUTANEOUS | Status: AC
Start: 2024-03-19 — End: 2024-03-19
  Administered 2024-03-19: 6 mg via SUBCUTANEOUS

## 2024-03-19 MED ORDER — HEPARIN SOD (PORK) LOCK FLUSH 100 UNIT/ML IV SOLN
500.0000 [IU] | Freq: Once | INTRAVENOUS | Status: AC | PRN
Start: 2024-03-19 — End: 2024-03-19
  Administered 2024-03-19: 500 [IU]

## 2024-03-19 MED ORDER — SODIUM CHLORIDE 0.9% FLUSH
10.0000 mL | INTRAVENOUS | Status: DC | PRN
Start: 2024-03-19 — End: 2024-03-19
  Administered 2024-03-19: 10 mL

## 2024-03-19 NOTE — Telephone Encounter (Signed)
 Per Dr. Cloretta: manage symptoms with Tylenol /cough medication. Push po fluids.  Get a COVID/flu test kit. Most likely has a viral illness. Cancel the IV iron  today. If he develops high fever or has SOB, go to emergency room  Nena notified and instructed her to call PCP if either test is positive, as PCP office is more experienced with TamiFlu and Paxlovid scripts. He will still come in for pump d/c today. Instructed to be sure he wears a mask when he comes in.

## 2024-03-19 NOTE — Telephone Encounter (Signed)
 Patient scheduled for Venofer  300 mg on Thursday 03/27/2024 at 0830. Patient verified and confirmed this appointment date and time.

## 2024-03-19 NOTE — Progress Notes (Signed)
 The patient received Udenyca  and had his pump removed. He reported experiencing nausea but had not taken any medication to alleviate it. I advised the patient to go home and use his prescribed medication to manage his nausea. The patient also reported a low-grade fever earlier, with a temperature of 98.68F. Upon discharge from the Cancer Center, the patient was stable and planned to visit the pharmacy to obtain a COVID-19 test.

## 2024-03-19 NOTE — Patient Instructions (Signed)

## 2024-03-19 NOTE — Telephone Encounter (Signed)
 Call from significant other that Anthony Calhoun has developed over past couple days generalized body aches and his right arm hurts. After Tylenol  pain is rated 5. Temp is 99.1 after Tylenol . No swelling of that arm or s/s infection at port site. Also reports headache and right eye hurts--no redness, swelling or drainage. Has new cough as well that is non-productive. Does not have flu or COVID test kit at home. Scheduled for IV iron  infusion today, but does not feel well enough to have it.

## 2024-03-20 ENCOUNTER — Encounter: Payer: Self-pay | Admitting: Oncology

## 2024-03-21 ENCOUNTER — Telehealth: Payer: Self-pay | Admitting: *Deleted

## 2024-03-21 NOTE — Telephone Encounter (Signed)
 Returned call from Anthony Calhoun, who wanted to speak to Dr. Cloretta today. He is not feeling well. Has nausea that promethazine  is not helping. Having abdominal pain at site of his hernia and abdomen is swell and getting hard. Had some diarrhea yesterday, but no BM today and feels very uncomfortable. Denies any fever. His COVID/Flu test was negative. Asking if chemo or iron  can cause abdominal pain and he was told no. Informed him MD is out of office today. Discussed w/NP and was told to go to emergency room or call his surgeon office and be seen in the urgent care clinic there. He agrees to try surgeon office.

## 2024-03-24 ENCOUNTER — Encounter: Payer: Self-pay | Admitting: Oncology

## 2024-03-25 ENCOUNTER — Encounter: Payer: Self-pay | Admitting: Oncology

## 2024-03-27 ENCOUNTER — Inpatient Hospital Stay

## 2024-03-27 VITALS — BP 125/70 | HR 77 | Temp 97.9°F | Resp 18 | Wt 252.8 lb

## 2024-03-27 DIAGNOSIS — D509 Iron deficiency anemia, unspecified: Secondary | ICD-10-CM | POA: Diagnosis not present

## 2024-03-27 DIAGNOSIS — Z79899 Other long term (current) drug therapy: Secondary | ICD-10-CM | POA: Diagnosis not present

## 2024-03-27 DIAGNOSIS — C182 Malignant neoplasm of ascending colon: Secondary | ICD-10-CM | POA: Diagnosis not present

## 2024-03-27 DIAGNOSIS — E785 Hyperlipidemia, unspecified: Secondary | ICD-10-CM | POA: Diagnosis not present

## 2024-03-27 DIAGNOSIS — K219 Gastro-esophageal reflux disease without esophagitis: Secondary | ICD-10-CM | POA: Diagnosis not present

## 2024-03-27 DIAGNOSIS — Z5111 Encounter for antineoplastic chemotherapy: Secondary | ICD-10-CM | POA: Diagnosis not present

## 2024-03-27 DIAGNOSIS — I10 Essential (primary) hypertension: Secondary | ICD-10-CM | POA: Diagnosis not present

## 2024-03-27 MED ORDER — HEPARIN SOD (PORK) LOCK FLUSH 100 UNIT/ML IV SOLN
500.0000 [IU] | Freq: Once | INTRAVENOUS | Status: AC | PRN
  Administered 2024-03-27: 500 [IU]

## 2024-03-27 MED ORDER — SODIUM CHLORIDE 0.9% FLUSH
10.0000 mL | Freq: Once | INTRAVENOUS | Status: AC | PRN
Start: 2024-03-27 — End: 2024-03-27
  Administered 2024-03-27: 10 mL

## 2024-03-27 MED ORDER — SODIUM CHLORIDE 0.9 % IV SOLN: INTRAVENOUS | Status: DC

## 2024-03-27 MED ORDER — SODIUM CHLORIDE 0.9 % IV SOLN
300.0000 mg | Freq: Once | INTRAVENOUS | Status: AC
  Administered 2024-03-27: 300 mg via INTRAVENOUS
  Filled 2024-03-27: qty 200

## 2024-03-27 NOTE — Patient Instructions (Signed)

## 2024-03-31 ENCOUNTER — Other Ambulatory Visit: Payer: Self-pay | Admitting: Oncology

## 2024-04-07 ENCOUNTER — Inpatient Hospital Stay

## 2024-04-07 ENCOUNTER — Inpatient Hospital Stay: Attending: Oncology | Admitting: Nurse Practitioner

## 2024-04-07 ENCOUNTER — Encounter: Payer: Self-pay | Admitting: Oncology

## 2024-04-07 ENCOUNTER — Encounter: Payer: Self-pay | Admitting: Nurse Practitioner

## 2024-04-07 VITALS — BP 135/73 | HR 96 | Temp 98.0°F | Resp 18 | Ht 66.0 in | Wt 247.2 lb

## 2024-04-07 DIAGNOSIS — K219 Gastro-esophageal reflux disease without esophagitis: Secondary | ICD-10-CM | POA: Insufficient documentation

## 2024-04-07 DIAGNOSIS — F32A Depression, unspecified: Secondary | ICD-10-CM | POA: Insufficient documentation

## 2024-04-07 DIAGNOSIS — C189 Malignant neoplasm of colon, unspecified: Secondary | ICD-10-CM | POA: Diagnosis not present

## 2024-04-07 DIAGNOSIS — Z5111 Encounter for antineoplastic chemotherapy: Secondary | ICD-10-CM | POA: Insufficient documentation

## 2024-04-07 DIAGNOSIS — Z79899 Other long term (current) drug therapy: Secondary | ICD-10-CM | POA: Insufficient documentation

## 2024-04-07 DIAGNOSIS — C787 Secondary malignant neoplasm of liver and intrahepatic bile duct: Secondary | ICD-10-CM | POA: Diagnosis not present

## 2024-04-07 DIAGNOSIS — D509 Iron deficiency anemia, unspecified: Secondary | ICD-10-CM | POA: Insufficient documentation

## 2024-04-07 DIAGNOSIS — F410 Panic disorder [episodic paroxysmal anxiety] without agoraphobia: Secondary | ICD-10-CM | POA: Insufficient documentation

## 2024-04-07 DIAGNOSIS — C182 Malignant neoplasm of ascending colon: Secondary | ICD-10-CM | POA: Insufficient documentation

## 2024-04-07 DIAGNOSIS — E785 Hyperlipidemia, unspecified: Secondary | ICD-10-CM | POA: Insufficient documentation

## 2024-04-07 DIAGNOSIS — I10 Essential (primary) hypertension: Secondary | ICD-10-CM | POA: Insufficient documentation

## 2024-04-07 LAB — CBC WITH DIFFERENTIAL (CANCER CENTER ONLY)
Abs Immature Granulocytes: 0.04 K/uL (ref 0.00–0.07)
Basophils Absolute: 0.1 K/uL (ref 0.0–0.1)
Basophils Relative: 1 %
Eosinophils Absolute: 0.1 K/uL (ref 0.0–0.5)
Eosinophils Relative: 2 %
HCT: 34.2 % — ABNORMAL LOW (ref 39.0–52.0)
Hemoglobin: 10.1 g/dL — ABNORMAL LOW (ref 13.0–17.0)
Immature Granulocytes: 1 %
Lymphocytes Relative: 18 %
Lymphs Abs: 0.9 K/uL (ref 0.7–4.0)
MCH: 22.6 pg — ABNORMAL LOW (ref 26.0–34.0)
MCHC: 29.5 g/dL — ABNORMAL LOW (ref 30.0–36.0)
MCV: 76.5 fL — ABNORMAL LOW (ref 80.0–100.0)
Monocytes Absolute: 0.4 K/uL (ref 0.1–1.0)
Monocytes Relative: 8 %
Neutro Abs: 3.6 K/uL (ref 1.7–7.7)
Neutrophils Relative %: 70 %
Platelet Count: 195 K/uL (ref 150–400)
RBC: 4.47 MIL/uL (ref 4.22–5.81)
RDW: 20.2 % — ABNORMAL HIGH (ref 11.5–15.5)
WBC Count: 5.1 K/uL (ref 4.0–10.5)
nRBC: 0 % (ref 0.0–0.2)

## 2024-04-07 LAB — CMP (CANCER CENTER ONLY)
ALT: 39 U/L (ref 0–44)
AST: 37 U/L (ref 15–41)
Albumin: 4 g/dL (ref 3.5–5.0)
Alkaline Phosphatase: 187 U/L — ABNORMAL HIGH (ref 38–126)
Anion gap: 11 (ref 5–15)
BUN: 13 mg/dL (ref 6–20)
CO2: 25 mmol/L (ref 22–32)
Calcium: 9.2 mg/dL (ref 8.9–10.3)
Chloride: 98 mmol/L (ref 98–111)
Creatinine: 0.84 mg/dL (ref 0.61–1.24)
GFR, Estimated: >60 mL/min (ref >60)
Glucose, Bld: 251 mg/dL — ABNORMAL HIGH (ref 70–99)
Potassium: 3.9 mmol/L (ref 3.5–5.1)
Sodium: 134 mmol/L — ABNORMAL LOW (ref 135–145)
Total Bilirubin: 0.9 mg/dL (ref 0.0–1.2)
Total Protein: 7.4 g/dL (ref 6.5–8.1)

## 2024-04-07 LAB — CEA (ACCESS): CEA (CHCC): 27.89 ng/mL — ABNORMAL HIGH (ref 0.00–5.00)

## 2024-04-07 LAB — FERRITIN: Ferritin: 64 ng/mL (ref 24–336)

## 2024-04-07 LAB — TOTAL PROTEIN, URINE DIPSTICK: Protein, ur: 30 mg/dL — AB

## 2024-04-07 NOTE — Progress Notes (Addendum)
 [facility] OFFICE PROGRESS NOTE   Diagnosis: Colon cancer  INTERVAL HISTORY:   Anthony Calhoun returns as scheduled.  He completed another cycle of FOLFIRI/bevacizumab  03/17/2024.  For the past 4-5 days he has felt weak.  He has intermittent nausea.  No vomiting.  He reports lower abdominal discomfort.  No diarrhea.  No bleeding.  No fever or chills.  Highest temperature 99.1.  He denies urinary symptoms.  Objective:  Vital signs in last 24 hours:  Blood pressure 135/73, pulse 96, temperature 98 F (36.7 C), resp. rate 18, height 5' 6 (1.676 m), weight 247 lb 3.2 oz (112.1 kg), SpO2 97%.    HEENT: No thrush or ulcers. Resp: Lungs clear bilaterally. Cardio: Regular rate and rhythm. GI: Abdomen is soft.  Mild generalized tenderness.  No hepatosplenomegaly.  No mass. Vascular: No leg edema Skin: Palms without erythema. Port-A-Cath without erythema.  Lab Results:  Lab Results  Component Value Date   WBC 5.1 04/07/2024   HGB 10.1 (L) 04/07/2024   HCT 34.2 (L) 04/07/2024   MCV 76.5 (L) 04/07/2024   PLT 195 04/07/2024   NEUTROABS 3.6 04/07/2024    Imaging:  No results found.  Medications: I have reviewed the patient's current medications.  Assessment/Plan:  Moderately differentiated adenocarcinoma of the ascending colon, stage IIb (T4aN0), status post a right colectomy 04/12/2020 Tumor invades the visceral peritoneum, 0/19 lymph nodes, no lymphovascular or perineural invasion, no tumor deposits, MSS, no loss of mismatch repair protein expression Cycle 1 adjuvant Xeloda  05/17/2020 Cycle 2 adjuvant Xeloda  06/07/2020, Xeloda  discontinued after 12 days secondary to nausea and rectal bleeding Cycle 3 adjuvant Xeloda  06/28/2020, dose reduced to 2000 mg a.m., 1500 mg p.m. secondary to nausea (patient discontinued Xeloda  on day 11 due to colonoscopy) Colonoscopy 07/09/2020-nodular mucosa at the colonic anastomosis, biopsied; patent end-to-side ileocolonic anastomosis  characterized by healthy-appearing mucosa and visible sutures; nonbleeding internal hemorrhoids, biopsy with benign ulcerated anastomotic mucosa with granulation tissue Cycle 4 adjuvant Xeloda  07/19/2020, 2000 mg every morning and 1500 mg every afternoon Cycle 5 adjuvant Xeloda  08/09/2020 Cycle 6 adjuvant Xeloda  08/31/2019 Cycle 7 adjuvant Xeloda  09/20/2020 Cycle 8 adjuvant Xeloda  10/11/2020 CTs 03/07/2021-no evidence of recurrent disease, hepatic steatosis, slight wall thickening at the junction of the descending and horizontal duodenum Mild elevation of CEA 2023 12/01/2021-Guardant Reveal-ct DNA detected PET 12/30/2021-hypermetabolic right liver lesion, hypermetabolic area in a loop of mid small bowel with an SUV of 12.5, review of PET images at GI tumor conference 01/11/2022-small bowel uptake felt to be a benign finding MRI liver 01/10/2022-solitary 1.2 cm right liver lesion between segments 7 and 8 compatible with metastasis, subtle changes suggesting cirrhosis, hepatic steatosis CT enteroscopy 02/02/2022-small bowel loop with hypermetabolic activity on PET continues to have a bandlike density along the margin felt to represent a diverticulum or adjacent nodal tissue.  Small bowel tumor not excluded.  3-4 mm left omental nodule 02/08/2022-biopsy and ablation of solitary liver lesion, adenocarcinoma consistent with metastatic colorectal adenocarcinoma CTs 03/31/2022-unchanged circumstantial soft tissue thickening surrounding a loop of mid to distal small bowel with an adjacent nodular focus measuring 1.2 cm.,  Ablation cavity in segment 7, no other evidence of metastatic disease Diagnostic laparoscopy 05/26/2022-mid small bowel mass-resected, well to moderately differentiated adenocarcinoma, 0/2 lymph nodes, morphology consistent with a colon primary, tumor involved full-thickness including serosa with perineural and large vessel involvement; foundation 1-microsatellite stable, tumor mutation burden 2, K-ras  Q61H CT abdomen/pelvis 07/10/2022-the right liver ablation defect is smaller, new subtle right liver lesion measuring  13 x 9 mm, enlarged lymph node in the right upper quadrant mesentery adjacent to surgical anastomosis PET 07/26/2022-new segment 6 liver lesion, enlarging hypermetabolic left omental lesion, there is another mildly hypermetabolic peritoneal implant, 7 mm omental nodule at the midline without hypermetabolism Cycle 1 FOLFOX 09/25/2022 Cycle 2 FOLFOX/bevacizumab  10/09/2022, 5-FU dose reduced secondary to mucositis Cycle 3 FOLFOX/bevacizumab  10/23/2022 Cycle 4 FOLFOX/bevacizumab  11/06/2022 Cycle 5 FOLFOX/bevacizumab  11/20/2022 Cycle 6 FOLFOX/bevacizumab  12/04/2022 CT abdomen/pelvis 12/14/2022-further contraction of the treated segment 7 lesion, slight enlargement of segment 6 lesion, no new liver lesion, increased size of ileocolic nodes and an omental lesion, stable small retroperitoneal nodes Cycle 7 FOLFOX/bevacizumab  12/18/2022 Cycle 8 FOLFOX/bevacizumab  01/02/2023, oxaliplatin  dose reduced secondary to prolonged nausea and neuropathy symptoms, Emend added Cycle 9 FOLFOX/bevacizumab  01/16/2023, oxaliplatin  discontinued secondary to neuropathy Cycle 1 FOLFIRI 02/19/2023, Avastin  held due to recent dental extractions Cycle 2 FOLFIRI/Avastin  03/05/2023 Cycle 3 FOLFIRI/Avastin  03/19/2023, 5-FU bolus held due to mucositis after cycle 2, Fulphila  added Cycle 4 FOLFIRI/Avastin  04/02/2023, Fulphila , Emend Cycle 5 FOLFIRI/Avastin  04/16/2023, Fulphila , Emend CTs 05/02/2023-similar ileocolic mesenteric adenopathy and omental metastasis, cirrhosis with portal hypertension, stable liver lesions, no new or progressive disease Cycle 6 FOLFIRI/Avastin  05/07/2023, Fulphila , Emend Cycle 7 FOLFIRI/Avastin  05/21/2023, Fulphila , Emend Cycle 8 FOLFIRI 06/04/2023, Fulphila , Emend; Avastin  held Cycle 9 FOLFIRI 06/18/2023, Fulphila , Emend, Avastin  held Treatment held 07/09/2023 patient request Cycle 10 FOLFIRI 07/18/2023,  Fulphila , Emend, Avastin  held CTs 07/31/2023: Stable with unchanged hepatic lesions, mesenteric lymph nodes, and left omental nodule, unchanged morphologic changes of cirrhosis and portal hypertension Cycle 11 FOLFIRI/Avastin  08/01/2023, Fulphila  Chemotherapy held 08/15/2023 per patient request Cycle 12 FOLFIRI/Avastin  08/30/2023, Udenyca  Cycle 13 FOLFIRI/Avastin  09/19/2023, Udenyca  Cycle 14 FOLFIRI/Avastin  10/01/2023, Udenyca  Cycle 15 FOLFIRI/Avastin  10/22/2023, Udenyca  11/05/2023 treatment held per patient request due to dental issue Cycle 16 FOLFIRI 11/19/2023, Udenyca , Avastin  held secondary to planned dental extractions CTs 11/28/2023: No evidence of progressive disease, stable hepatic, left upper quadrant omental, and ileocolic mesenteric lesions Cycle 17 FOLFIRI/Avastin  12/10/2023 Cycle 18 FOLFIRI 12/31/2023, Avastin  held secondary to 12/27/2023 tooth extraction Cycle 19 FOLFIRI/Avastin  01/22/2024 Cycle 20 FOLFIRI/Avastin  02/12/2024 03/08/2024 CTs: Stable small liver lesions, stable peritoneal nodules, stable left omental soft tissue mass, no evidence of disease progression, cirrhosis/portal hypertension Cycle 21 FOLFIRI/bevacizumab  03/17/2024 Treatment held 04/07/2024 per patient request Microcytic anemia secondary to #1-improved Colon polyps-sessile serrated adenoma of the descending colon, tubular adenomas with focal high-grade dysplasia of the transverse colon on colonoscopy 02/12/2020, tubular adenoma of the mid ascending colon on the surgical specimen 04/12/2020 Family history of colon cancer Depression Anxiety/panic attacks Hypertension Hyperlipidemia Nausea secondary to Xeloda ?-Resolved Gastroesophageal reflux disease-improved following discontinuation of Xeloda  Anemia, microcytic; ferritin 6 01/24/2022; stool positive for blood x3 01/25/2022; 02/02/2022 CT abdomen/pelvis enterography-bowel loop demonstrating hypermetabolic activity continues to have a bandlike density along its margin possibly  representing a diverticulum or adjacent nodal tissue, difficult to exclude small bowel tumor, 3 x 4 mm left omental nodule or lymph node, known right hepatic lobe tumor is occult on CT. Venofer  weekly x3 beginning 03/03/2022 Negative capsule endoscopy 03/09/2022 Venofer  03/03/2022 for 3 weekly doses Venofer  05/03/2022, 05/19/2022 Venofer  07/28/2022, 08/04/2022 Venofer  03/22/2023, 03/29/2023, 04/04/2023 Venofer  06/18/2023, 07/09/2023 07/03/2023 upper endoscopy and colonoscopy-severe portal hypertensive gastropathy in the gastric fundus, gastric body, stomach, red blood found greater curvature of the stomach and gastric antrum, treated with APC; colonoscopy showed dark old maroon-colored blood in the entire colon.  No evidence of active or recent bleeding in the colon. 12.  Anterior left neck nodule 03/17/2022-cyst?,  Lymph node? 13.  Mucositis secondary  to chemotherapy at office visit 10/03/2022-the 5-FU will be dose reduced with cycle 2 FOLFOX 14.  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  Oxaliplatin  neuropathy-mild loss of vibratory sense 12/04/2022, 12/18/2022, 01/02/2023  Disposition: Anthony Calhoun appears stable.  He completed another cycle of FOLFIRI/bevacizumab  about 3 weeks ago.  He seems to have tolerated that treatment fairly well.  He presents today for follow-up prior to the next cycle.  He reports fatigue, nausea, low abdominal discomfort for the past 4 or so days.  No fever or change in bowel habits.  He is not ill-appearing.  Vital signs are stable.  CBC and chemistry panel stable.  He declines chemotherapy today.  He will return for the next cycle of chemotherapy in 1 week.  He understands to contact the office if symptoms worsen or he develops new symptoms.    [name] ANP/GNP-BC   04/07/2024  10:02 AM

## 2024-04-07 NOTE — Progress Notes (Signed)
 Patient seen by [name] NP today  Vitals are within treatment parameters:Yes   Labs are within treatment parameters: Yes   Treatment plan has been signed: Yes   Per physician team, Patient will not be receiving treatment today.

## 2024-04-09 ENCOUNTER — Encounter

## 2024-04-09 ENCOUNTER — Encounter: Payer: Self-pay | Admitting: Oncology

## 2024-04-14 ENCOUNTER — Inpatient Hospital Stay

## 2024-04-14 ENCOUNTER — Encounter: Payer: Self-pay | Admitting: Nurse Practitioner

## 2024-04-14 ENCOUNTER — Inpatient Hospital Stay (HOSPITAL_BASED_OUTPATIENT_CLINIC_OR_DEPARTMENT_OTHER): Admitting: Nurse Practitioner

## 2024-04-14 VITALS — BP 133/69 | HR 86 | Temp 98.2°F | Resp 18 | Ht 66.0 in | Wt 248.4 lb

## 2024-04-14 DIAGNOSIS — I10 Essential (primary) hypertension: Secondary | ICD-10-CM | POA: Diagnosis not present

## 2024-04-14 DIAGNOSIS — C189 Malignant neoplasm of colon, unspecified: Secondary | ICD-10-CM | POA: Diagnosis not present

## 2024-04-14 DIAGNOSIS — D509 Iron deficiency anemia, unspecified: Secondary | ICD-10-CM | POA: Diagnosis not present

## 2024-04-14 DIAGNOSIS — C787 Secondary malignant neoplasm of liver and intrahepatic bile duct: Secondary | ICD-10-CM | POA: Diagnosis not present

## 2024-04-14 DIAGNOSIS — Z5111 Encounter for antineoplastic chemotherapy: Secondary | ICD-10-CM | POA: Diagnosis not present

## 2024-04-14 DIAGNOSIS — E785 Hyperlipidemia, unspecified: Secondary | ICD-10-CM | POA: Diagnosis not present

## 2024-04-14 DIAGNOSIS — K219 Gastro-esophageal reflux disease without esophagitis: Secondary | ICD-10-CM | POA: Diagnosis not present

## 2024-04-14 DIAGNOSIS — C182 Malignant neoplasm of ascending colon: Secondary | ICD-10-CM | POA: Diagnosis not present

## 2024-04-14 DIAGNOSIS — Z79899 Other long term (current) drug therapy: Secondary | ICD-10-CM | POA: Diagnosis not present

## 2024-04-14 LAB — CMP (CANCER CENTER ONLY)
ALT: 38 U/L (ref 0–44)
AST: 39 U/L (ref 15–41)
Albumin: 4.1 g/dL (ref 3.5–5.0)
Alkaline Phosphatase: 181 U/L — ABNORMAL HIGH (ref 38–126)
Anion gap: 13 (ref 5–15)
BUN: 10 mg/dL (ref 6–20)
CO2: 23 mmol/L (ref 22–32)
Calcium: 9.2 mg/dL (ref 8.9–10.3)
Chloride: 99 mmol/L (ref 98–111)
Creatinine: 0.86 mg/dL (ref 0.61–1.24)
GFR, Estimated: >60 mL/min (ref >60)
Glucose, Bld: 246 mg/dL — ABNORMAL HIGH (ref 70–99)
Potassium: 4.1 mmol/L (ref 3.5–5.1)
Sodium: 135 mmol/L (ref 135–145)
Total Bilirubin: 1.1 mg/dL (ref 0.0–1.2)
Total Protein: 7.6 g/dL (ref 6.5–8.1)

## 2024-04-14 LAB — CBC WITH DIFFERENTIAL (CANCER CENTER ONLY)
Abs Immature Granulocytes: 0.02 K/uL (ref 0.00–0.07)
Basophils Absolute: 0.1 K/uL (ref 0.0–0.1)
Basophils Relative: 2 %
Eosinophils Absolute: 0.1 K/uL (ref 0.0–0.5)
Eosinophils Relative: 2 %
HCT: 34.6 % — ABNORMAL LOW (ref 39.0–52.0)
Hemoglobin: 10.1 g/dL — ABNORMAL LOW (ref 13.0–17.0)
Immature Granulocytes: 0 %
Lymphocytes Relative: 16 %
Lymphs Abs: 0.8 K/uL (ref 0.7–4.0)
MCH: 21.9 pg — ABNORMAL LOW (ref 26.0–34.0)
MCHC: 29.2 g/dL — ABNORMAL LOW (ref 30.0–36.0)
MCV: 75.1 fL — ABNORMAL LOW (ref 80.0–100.0)
Monocytes Absolute: 0.4 K/uL (ref 0.1–1.0)
Monocytes Relative: 8 %
Neutro Abs: 3.4 K/uL (ref 1.7–7.7)
Neutrophils Relative %: 72 %
Platelet Count: 190 K/uL (ref 150–400)
RBC: 4.61 MIL/uL (ref 4.22–5.81)
RDW: 19.6 % — ABNORMAL HIGH (ref 11.5–15.5)
WBC Count: 4.8 K/uL (ref 4.0–10.5)
nRBC: 0 % (ref 0.0–0.2)

## 2024-04-14 LAB — TOTAL PROTEIN, URINE DIPSTICK

## 2024-04-14 MED ORDER — SODIUM CHLORIDE 0.9% FLUSH: 10.0000 mL | INTRAVENOUS | Status: DC | PRN

## 2024-04-14 MED ORDER — SODIUM CHLORIDE 0.9 % IV SOLN
Freq: Once | INTRAVENOUS | Status: AC
Start: 2024-04-14 — End: 2024-04-14

## 2024-04-14 MED ORDER — SODIUM CHLORIDE 0.9 % IV SOLN
5.0000 mg/kg | Freq: Once | INTRAVENOUS | Status: AC
  Administered 2024-04-14: 600 mg via INTRAVENOUS
  Filled 2024-04-14: qty 16

## 2024-04-14 MED ORDER — FLUOROURACIL CHEMO INJECTION 500 MG/10ML
200.0000 mg/m2 | Freq: Once | INTRAVENOUS | Status: AC
  Administered 2024-04-14: 450 mg via INTRAVENOUS
  Filled 2024-04-14: qty 9

## 2024-04-14 MED ORDER — SODIUM CHLORIDE 0.9 % IV SOLN
135.0000 mg/m2 | Freq: Once | INTRAVENOUS | Status: AC
  Administered 2024-04-14: 300 mg via INTRAVENOUS
  Filled 2024-04-14: qty 15

## 2024-04-14 MED ORDER — SODIUM CHLORIDE 0.9 % IV SOLN
200.0000 mg/m2 | Freq: Once | INTRAVENOUS | Status: AC
  Administered 2024-04-14: 444 mg via INTRAVENOUS
  Filled 2024-04-14: qty 22.2

## 2024-04-14 MED ORDER — PALONOSETRON HCL INJECTION 0.25 MG/5ML
0.2500 mg | Freq: Once | INTRAVENOUS | Status: AC
  Administered 2024-04-14: 0.25 mg via INTRAVENOUS
  Filled 2024-04-14: qty 5

## 2024-04-14 MED ORDER — SODIUM CHLORIDE 0.9 % IV SOLN
150.0000 mg | Freq: Once | INTRAVENOUS | Status: AC
  Administered 2024-04-14: 150 mg via INTRAVENOUS
  Filled 2024-04-14: qty 150

## 2024-04-14 MED ORDER — DEXAMETHASONE SODIUM PHOSPHATE 10 MG/ML IJ SOLN
10.0000 mg | Freq: Once | INTRAMUSCULAR | Status: AC
  Administered 2024-04-14: 10 mg via INTRAVENOUS
  Filled 2024-04-14: qty 1

## 2024-04-14 MED ORDER — ATROPINE SULFATE 1 MG/ML IV SOLN
0.5000 mg | Freq: Once | INTRAVENOUS | Status: AC | PRN
  Administered 2024-04-14: 0.5 mg via INTRAVENOUS
  Filled 2024-04-14: qty 1

## 2024-04-14 MED ORDER — SODIUM CHLORIDE 0.9 % IV SOLN
1600.0000 mg/m2 | INTRAVENOUS | Status: DC
  Administered 2024-04-14: 3500 mg via INTRAVENOUS
  Filled 2024-04-14: qty 70

## 2024-04-14 NOTE — Patient Instructions (Signed)
 [facility] - A DEPT OF . [facility]  Discharge Instructions: Thank you for choosing [facility] to provide your oncology and hematology care.   If you have a lab appointment with the Cancer Center, please go directly to the Cancer Center and check in at the registration area.   Wear comfortable clothing and clothing appropriate for easy access to any Portacath or PICC line.   We strive to give you quality time with your provider. You may need to reschedule your appointment if you arrive late (15 or more minutes).  Arriving late affects you and other patients whose appointments are after yours.  Also, if you miss three or more appointments without notifying the office, you may be dismissed from the clinic at the provider's discretion.      For prescription refill requests, have your pharmacy contact our office and allow 72 hours for refills to be completed.    Today you received the following chemotherapy and/or immunotherapy agents: bevacizumab , irinotecan , leucovorin , fluorouracil        To help prevent nausea and vomiting after your treatment, we encourage you to take your nausea medication as directed.  BELOW ARE SYMPTOMS THAT SHOULD BE REPORTED IMMEDIATELY: *FEVER GREATER THAN 100.4 F (38 C) OR HIGHER *CHILLS OR SWEATING *NAUSEA AND VOMITING THAT IS NOT CONTROLLED WITH YOUR NAUSEA MEDICATION *UNUSUAL SHORTNESS OF BREATH *UNUSUAL BRUISING OR BLEEDING *URINARY PROBLEMS (pain or burning when urinating, or frequent urination) *BOWEL PROBLEMS (unusual diarrhea, constipation, pain near the anus) TENDERNESS IN MOUTH AND THROAT WITH OR WITHOUT PRESENCE OF ULCERS (sore throat, sores in mouth, or a toothache) UNUSUAL RASH, SWELLING OR PAIN  UNUSUAL VAGINAL DISCHARGE OR ITCHING   Items with * indicate a potential emergency and should be followed up as soon as possible or go to the Emergency Department if any problems should occur.  Please show the  CHEMOTHERAPY ALERT CARD or IMMUNOTHERAPY ALERT CARD at check-in to the Emergency Department and triage nurse.  Should you have questions after your visit or need to cancel or reschedule your appointment, please contact [facility] - A DEPT OF [facility]  Dept: [phone]  and follow the prompts.  Office hours are 8:00 a.m. to 4:30 p.m. Monday - Friday. Please note that voicemails left after 4:00 p.m. may not be returned until the following business day.  We are closed weekends and major holidays. You have access to a nurse at all times for urgent questions. Please call the main number to the clinic Dept: [phone] and follow the prompts.   For any non-urgent questions, you may also contact your provider using MyChart. We now offer e-Visits for anyone 14 and older to request care online for non-urgent symptoms. For details visit [email or web address].   Also download the MyChart app! Go to the app store, search MyChart, open the app, select [facility], and log in with your MyChart username and password.

## 2024-04-14 NOTE — Progress Notes (Signed)
 [facility] OFFICE PROGRESS NOTE   Diagnosis: Colon cancer  INTERVAL HISTORY:   Anthony Calhoun returns as scheduled.  He was last treated 03/17/2024.  He declined treatment last week because he not feel well.  He is feeling better.  He continues to have mild intermittent bloating and nausea.  This is his baseline.  Bowel movements are normal.  He describes his appetite as pretty good.  He denies bleeding.  He reports an occasional cough over the past few months.  No fever.  No shortness of breath.  He notes nasal congestion and drainage.  Objective:  Vital signs in last 24 hours:  Blood pressure 133/69, pulse 86, temperature 98.2 F (36.8 C), temperature source Temporal, resp. rate 18, height 5' 6 (1.676 m), weight 248 lb 6.4 oz (112.7 kg), SpO2 98%.    HEENT: No thrush or ulcers. Resp: Lungs clear bilaterally. Cardio: Regular rate and rhythm. GI: Abdomen soft, nontender.  No hepatosplenomegaly. Vascular: No leg edema. Skin: Palms without erythema. Port-A-Cath without erythema.  Lab Results:  Lab Results  Component Value Date   WBC 4.8 04/14/2024   HGB 10.1 (L) 04/14/2024   HCT 34.6 (L) 04/14/2024   MCV 75.1 (L) 04/14/2024   PLT 190 04/14/2024   NEUTROABS 3.4 04/14/2024    Imaging:  No results found.  Medications: I have reviewed the patient's current medications.  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 secondary to nausea (patient discontinued Xeloda  on day 11 due  to colonoscopy) Colonoscopy 07/09/2020-nodular mucosa at the colonic anastomosis, biopsied; patent end-to-side ileocolonic anastomosis characterized by healthy-appearing mucosa and visible sutures; nonbleeding internal hemorrhoids, biopsy with benign ulcerated anastomotic mucosa with granulation tissue Cycle 4 adjuvant Xeloda  07/19/2020, 2000 mg every morning and 1500 mg every afternoon Cycle 5 adjuvant Xeloda  08/09/2020 Cycle 6 adjuvant Xeloda  08/31/2019 Cycle 7 adjuvant Xeloda  09/20/2020 Cycle 8 adjuvant Xeloda  10/11/2020 CTs 03/07/2021-no evidence of recurrent disease, hepatic steatosis, slight wall thickening at the junction of the descending and horizontal duodenum Mild elevation of CEA 2023 12/01/2021-Guardant Reveal-ct DNA detected PET 12/30/2021-hypermetabolic right liver lesion, hypermetabolic area in a loop of mid small bowel with an SUV of 12.5, review of PET images at GI tumor conference 01/11/2022-small bowel uptake felt to be a benign finding MRI liver 01/10/2022-solitary 1.2 cm right liver lesion between segments 7 and 8 compatible with metastasis, subtle changes suggesting cirrhosis, hepatic steatosis CT enteroscopy 02/02/2022-small bowel loop with hypermetabolic activity on PET continues to have a bandlike density along the margin felt to represent a diverticulum or adjacent nodal tissue.  Small bowel tumor not excluded.  3-4 mm left omental nodule 02/08/2022-biopsy and ablation of solitary liver lesion, adenocarcinoma consistent with metastatic colorectal adenocarcinoma CTs 03/31/2022-unchanged circumstantial soft tissue thickening surrounding a loop of mid to distal small bowel with an adjacent nodular focus measuring 1.2 cm.,  Ablation cavity in segment 7, no other evidence of metastatic disease Diagnostic laparoscopy 05/26/2022-mid small bowel mass-resected, well to moderately differentiated adenocarcinoma, 0/2 lymph nodes, morphology consistent with a colon primary, tumor involved full-thickness  including serosa with perineural and large vessel involvement; foundation  1-microsatellite stable, tumor mutation burden 2, K-ras Q61H CT abdomen/pelvis 07/10/2022-the right liver ablation defect is smaller, new subtle right liver lesion measuring 13 x 9 mm, enlarged lymph node in the right upper quadrant mesentery adjacent to surgical anastomosis PET 07/26/2022-new segment 6 liver lesion, enlarging hypermetabolic left omental lesion, there is another mildly hypermetabolic peritoneal implant, 7 mm omental nodule at the midline without hypermetabolism Cycle 1 FOLFOX 09/25/2022 Cycle 2 FOLFOX/bevacizumab  10/09/2022, 5-FU dose reduced secondary to mucositis Cycle 3 FOLFOX/bevacizumab  10/23/2022 Cycle 4 FOLFOX/bevacizumab  11/06/2022 Cycle 5 FOLFOX/bevacizumab  11/20/2022 Cycle 6 FOLFOX/bevacizumab  12/04/2022 CT abdomen/pelvis 12/14/2022-further contraction of the treated segment 7 lesion, slight enlargement of segment 6 lesion, no new liver lesion, increased size of ileocolic nodes and an omental lesion, stable small retroperitoneal nodes Cycle 7 FOLFOX/bevacizumab  12/18/2022 Cycle 8 FOLFOX/bevacizumab  01/02/2023, oxaliplatin  dose reduced secondary to prolonged nausea and neuropathy symptoms, Emend added Cycle 9 FOLFOX/bevacizumab  01/16/2023, oxaliplatin  discontinued secondary to neuropathy Cycle 1 FOLFIRI 02/19/2023, Avastin  held due to recent dental extractions Cycle 2 FOLFIRI/Avastin  03/05/2023 Cycle 3 FOLFIRI/Avastin  03/19/2023, 5-FU bolus held due to mucositis after cycle 2, Fulphila  added Cycle 4 FOLFIRI/Avastin  04/02/2023, Fulphila , Emend Cycle 5 FOLFIRI/Avastin  04/16/2023, Fulphila , Emend CTs 05/02/2023-similar ileocolic mesenteric adenopathy and omental metastasis, cirrhosis with portal hypertension, stable liver lesions, no new or progressive disease Cycle 6 FOLFIRI/Avastin  05/07/2023, Fulphila , Emend Cycle 7 FOLFIRI/Avastin  05/21/2023, Fulphila , Emend Cycle 8 FOLFIRI 06/04/2023, Fulphila , Emend; Avastin   held Cycle 9 FOLFIRI 06/18/2023, Fulphila , Emend, Avastin  held Treatment held 07/09/2023 patient request Cycle 10 FOLFIRI 07/18/2023, Fulphila , Emend, Avastin  held CTs 07/31/2023: Stable with unchanged hepatic lesions, mesenteric lymph nodes, and left omental nodule, unchanged morphologic changes of cirrhosis and portal hypertension Cycle 11 FOLFIRI/Avastin  08/01/2023, Fulphila  Chemotherapy held 08/15/2023 per patient request Cycle 12 FOLFIRI/Avastin  08/30/2023, Udenyca  Cycle 13 FOLFIRI/Avastin  09/19/2023, Udenyca  Cycle 14 FOLFIRI/Avastin  10/01/2023, Udenyca  Cycle 15 FOLFIRI/Avastin  10/22/2023, Udenyca  11/05/2023 treatment held per patient request due to dental issue Cycle 16 FOLFIRI 11/19/2023, Udenyca , Avastin  held secondary to planned dental extractions CTs 11/28/2023: No evidence of progressive disease, stable hepatic, left upper quadrant omental, and ileocolic mesenteric lesions Cycle 17 FOLFIRI/Avastin  12/10/2023 Cycle 18 FOLFIRI 12/31/2023, Avastin  held secondary to 12/27/2023 tooth extraction Cycle 19 FOLFIRI/Avastin  01/22/2024 Cycle 20 FOLFIRI/Avastin  02/12/2024 03/08/2024 CTs: Stable small liver lesions, stable peritoneal nodules, stable left omental soft tissue mass, no evidence of disease progression, cirrhosis/portal hypertension Cycle 21 FOLFIRI/bevacizumab  03/17/2024 Treatment held 04/07/2024 per patient request Cycle 22 FOLFIRI/bevacizumab  04/14/2024  Microcytic anemia secondary to #1-improved Colon polyps-sessile serrated adenoma of the descending colon, tubular adenomas with focal high-grade dysplasia of the transverse colon on colonoscopy 02/12/2020, tubular adenoma of the mid ascending colon on the surgical specimen 04/12/2020 Family history of colon cancer Depression Anxiety/panic attacks Hypertension Hyperlipidemia Nausea secondary to Xeloda ?-Resolved Gastroesophageal reflux disease-improved following discontinuation of Xeloda  Anemia, microcytic; ferritin 6 01/24/2022; stool positive for  blood x3 01/25/2022; 02/02/2022 CT abdomen/pelvis enterography-bowel loop demonstrating hypermetabolic activity continues to have a bandlike density along its margin possibly representing a diverticulum or adjacent nodal tissue, difficult to exclude small bowel tumor, 3 x 4 mm left omental nodule or lymph node, known right hepatic lobe tumor is occult on CT. Venofer  weekly x3 beginning 03/03/2022 Negative capsule endoscopy 03/09/2022 Venofer  03/03/2022 for 3 weekly doses Venofer  05/03/2022, 05/19/2022 Venofer  07/28/2022, 08/04/2022 Venofer  03/22/2023, 03/29/2023, 04/04/2023 Venofer  06/18/2023, 07/09/2023 07/03/2023 upper endoscopy and colonoscopy-severe portal hypertensive gastropathy in the gastric fundus, gastric body, stomach, red blood found greater curvature of the stomach and gastric antrum, treated with APC; colonoscopy showed dark old maroon-colored blood in  the entire colon.  No evidence of active or recent bleeding in the colon. 12.  Anterior left neck nodule 03/17/2022-cyst?,  Lymph node? 13.  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  Oxaliplatin  neuropathy-mild loss of vibratory sense 12/04/2022, 12/18/2022, 01/02/2023  Disposition: Mr. Loden appears stable.  He continues treatment with FOLFIRI/bevacizumab .  Treatment was held last week per his request.  He would like to go ahead with treatment today.  Plan to proceed with cycle 22 today as scheduled.  CBC and chemistry panel reviewed.  Labs adequate for treatment.  He will return for follow-up and the next cycle of chemotherapy in 3 weeks.  We are available to see him sooner if needed.    [name] ANP/GNP-BC   04/14/2024  10:46 AM

## 2024-04-14 NOTE — Patient Instructions (Signed)

## 2024-04-14 NOTE — Progress Notes (Signed)
 Patient seen by [name] NP today  Vitals are within treatment parameters:Yes   Labs are within treatment parameters: Yes   Treatment plan has been signed: Yes   Per physician team, Patient is ready for treatment and there are NO modifications to the treatment plan.

## 2024-04-16 ENCOUNTER — Other Ambulatory Visit: Payer: Self-pay

## 2024-04-16 ENCOUNTER — Inpatient Hospital Stay

## 2024-04-16 ENCOUNTER — Other Ambulatory Visit (HOSPITAL_BASED_OUTPATIENT_CLINIC_OR_DEPARTMENT_OTHER): Payer: Self-pay

## 2024-04-16 VITALS — BP 137/67 | HR 78 | Temp 98.1°F | Resp 18

## 2024-04-16 DIAGNOSIS — Z79899 Other long term (current) drug therapy: Secondary | ICD-10-CM | POA: Diagnosis not present

## 2024-04-16 DIAGNOSIS — E785 Hyperlipidemia, unspecified: Secondary | ICD-10-CM | POA: Diagnosis not present

## 2024-04-16 DIAGNOSIS — C189 Malignant neoplasm of colon, unspecified: Secondary | ICD-10-CM

## 2024-04-16 DIAGNOSIS — C182 Malignant neoplasm of ascending colon: Secondary | ICD-10-CM | POA: Diagnosis not present

## 2024-04-16 DIAGNOSIS — D509 Iron deficiency anemia, unspecified: Secondary | ICD-10-CM | POA: Diagnosis not present

## 2024-04-16 DIAGNOSIS — I10 Essential (primary) hypertension: Secondary | ICD-10-CM | POA: Diagnosis not present

## 2024-04-16 DIAGNOSIS — Z5111 Encounter for antineoplastic chemotherapy: Secondary | ICD-10-CM | POA: Diagnosis not present

## 2024-04-16 DIAGNOSIS — K219 Gastro-esophageal reflux disease without esophagitis: Secondary | ICD-10-CM | POA: Diagnosis not present

## 2024-04-16 MED ORDER — PEGFILGRASTIM-CBQV 6 MG/0.6ML ~~LOC~~ SOSY
6.0000 mg | PREFILLED_SYRINGE | Freq: Once | SUBCUTANEOUS | Status: AC
  Administered 2024-04-16: 6 mg via SUBCUTANEOUS
  Filled 2024-04-16: qty 0.6

## 2024-04-16 MED ORDER — PROMETHAZINE HCL 25 MG PO TABS
25.0000 mg | ORAL_TABLET | Freq: Four times a day (QID) | ORAL | 1 refills | Status: AC | PRN
  Filled 2024-04-16: qty 60, 15d supply, fill #0

## 2024-04-16 NOTE — Patient Instructions (Signed)

## 2024-04-25 DIAGNOSIS — C182 Malignant neoplasm of ascending colon: Secondary | ICD-10-CM | POA: Diagnosis not present

## 2024-04-29 ENCOUNTER — Ambulatory Visit

## 2024-04-29 ENCOUNTER — Other Ambulatory Visit

## 2024-04-29 ENCOUNTER — Ambulatory Visit: Admitting: Oncology

## 2024-04-29 ENCOUNTER — Ambulatory Visit: Admitting: Nurse Practitioner

## 2024-05-01 ENCOUNTER — Encounter

## 2024-05-01 ENCOUNTER — Encounter: Payer: Self-pay | Admitting: Oncology

## 2024-05-02 ENCOUNTER — Other Ambulatory Visit: Payer: Self-pay | Admitting: Oncology

## 2024-05-05 ENCOUNTER — Inpatient Hospital Stay

## 2024-05-05 ENCOUNTER — Ambulatory Visit: Payer: Self-pay | Admitting: Oncology

## 2024-05-05 ENCOUNTER — Inpatient Hospital Stay: Attending: Oncology | Admitting: Oncology

## 2024-05-05 ENCOUNTER — Other Ambulatory Visit: Payer: Self-pay | Admitting: *Deleted

## 2024-05-05 VITALS — BP 121/74 | HR 98 | Temp 97.9°F | Resp 18 | Ht 66.0 in | Wt 249.2 lb

## 2024-05-05 DIAGNOSIS — C182 Malignant neoplasm of ascending colon: Secondary | ICD-10-CM | POA: Insufficient documentation

## 2024-05-05 DIAGNOSIS — C189 Malignant neoplasm of colon, unspecified: Secondary | ICD-10-CM | POA: Diagnosis not present

## 2024-05-05 DIAGNOSIS — C787 Secondary malignant neoplasm of liver and intrahepatic bile duct: Secondary | ICD-10-CM

## 2024-05-05 DIAGNOSIS — D509 Iron deficiency anemia, unspecified: Secondary | ICD-10-CM | POA: Insufficient documentation

## 2024-05-05 DIAGNOSIS — Z79899 Other long term (current) drug therapy: Secondary | ICD-10-CM | POA: Diagnosis not present

## 2024-05-05 DIAGNOSIS — Z5111 Encounter for antineoplastic chemotherapy: Secondary | ICD-10-CM | POA: Insufficient documentation

## 2024-05-05 LAB — CMP (CANCER CENTER ONLY)
ALT: 32 U/L (ref 0–44)
AST: 34 U/L (ref 15–41)
Albumin: 4.1 g/dL (ref 3.5–5.0)
Alkaline Phosphatase: 177 U/L — ABNORMAL HIGH (ref 38–126)
Anion gap: 13 (ref 5–15)
BUN: 7 mg/dL (ref 6–20)
CO2: 23 mmol/L (ref 22–32)
Calcium: 9.1 mg/dL (ref 8.9–10.3)
Chloride: 100 mmol/L (ref 98–111)
Creatinine: 0.85 mg/dL (ref 0.61–1.24)
GFR, Estimated: >60 mL/min (ref >60)
Glucose, Bld: 232 mg/dL — ABNORMAL HIGH (ref 70–99)
Potassium: 3.8 mmol/L (ref 3.5–5.1)
Sodium: 136 mmol/L (ref 135–145)
Total Bilirubin: 1 mg/dL (ref 0.0–1.2)
Total Protein: 7.4 g/dL (ref 6.5–8.1)

## 2024-05-05 LAB — TOTAL PROTEIN, URINE DIPSTICK

## 2024-05-05 LAB — CBC WITH DIFFERENTIAL (CANCER CENTER ONLY)
Abs Immature Granulocytes: 0.04 K/uL (ref 0.00–0.07)
Basophils Absolute: 0.1 K/uL (ref 0.0–0.1)
Basophils Relative: 1 %
Eosinophils Absolute: 0.1 K/uL (ref 0.0–0.5)
Eosinophils Relative: 2 %
HCT: 33.7 % — ABNORMAL LOW (ref 39.0–52.0)
Hemoglobin: 9.9 g/dL — ABNORMAL LOW (ref 13.0–17.0)
Immature Granulocytes: 1 %
Lymphocytes Relative: 16 %
Lymphs Abs: 1 K/uL (ref 0.7–4.0)
MCH: 22 pg — ABNORMAL LOW (ref 26.0–34.0)
MCHC: 29.4 g/dL — ABNORMAL LOW (ref 30.0–36.0)
MCV: 74.7 fL — ABNORMAL LOW (ref 80.0–100.0)
Monocytes Absolute: 0.5 K/uL (ref 0.1–1.0)
Monocytes Relative: 8 %
Neutro Abs: 4.4 K/uL (ref 1.7–7.7)
Neutrophils Relative %: 72 %
Platelet Count: 180 K/uL (ref 150–400)
RBC: 4.51 MIL/uL (ref 4.22–5.81)
RDW: 20.7 % — ABNORMAL HIGH (ref 11.5–15.5)
WBC Count: 6.1 K/uL (ref 4.0–10.5)
nRBC: 0 % (ref 0.0–0.2)

## 2024-05-05 LAB — FERRITIN: Ferritin: 20 ng/mL — ABNORMAL LOW (ref 24–336)

## 2024-05-05 NOTE — Progress Notes (Signed)
 [facility] OFFICE PROGRESS NOTE   Diagnosis: Colon cancer  INTERVAL HISTORY:   Anthony Calhoun completed another cycle of FOLFIRI/bevacizumab  04/14/2024.  He reports intermittent nausea.  He complains of increased malaise.  He has a nonproductive cough.  He has exertional dyspnea.  He feels bloated .  No bleeding.  Objective:  Vital signs in last 24 hours:  Blood pressure 121/74, pulse 98, temperature 97.9 F (36.6 C), temperature source Temporal, resp. rate 18, height 5' 6 (1.676 m), weight 249 lb 3.2 oz (113 kg), SpO2 96%.    HEENT: No thrush or ulcers Resp: Lungs clear bilaterally Cardio: Regular rate and rhythm GI: No hepatosplenomegaly, mildly distended, no mass, nontender, no apparent ascites Vascular: No leg edema  Portacath/PICC-without erythema  Lab Results:  Lab Results  Component Value Date   WBC 6.1 05/05/2024   HGB 9.9 (L) 05/05/2024   HCT 33.7 (L) 05/05/2024   MCV 74.7 (L) 05/05/2024   PLT 180 05/05/2024   NEUTROABS 4.4 05/05/2024    CMP  Lab Results  Component Value Date   NA 136 05/05/2024   K 3.8 05/05/2024   CL 100 05/05/2024   CO2 23 05/05/2024   GLUCOSE 232 (H) 05/05/2024   BUN 7 05/05/2024   CREATININE 0.85 05/05/2024   CALCIUM  9.1 05/05/2024   PROT 7.4 05/05/2024   ALBUMIN 4.1 05/05/2024   AST 34 05/05/2024   ALT 32 05/05/2024   ALKPHOS 177 (H) 05/05/2024   BILITOT 1.0 05/05/2024   GFRNONAA >60 05/05/2024   GFRAA >60 05/10/2020    Lab Results  Component Value Date   CEA1 1.08 03/07/2021   CEA 27.89 (H) 04/07/2024     Medications: I have reviewed the patient's current medications.   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 secondary to nausea (patient discontinued Xeloda  on day 11 due to colonoscopy) Colonoscopy 07/09/2020-nodular mucosa at the colonic anastomosis, biopsied; patent end-to-side ileocolonic anastomosis characterized by healthy-appearing mucosa and visible sutures; nonbleeding internal hemorrhoids, biopsy with benign ulcerated anastomotic mucosa with granulation tissue Cycle 4 adjuvant Xeloda  07/19/2020, 2000 mg every morning and 1500 mg every afternoon Cycle 5 adjuvant Xeloda  08/09/2020 Cycle 6 adjuvant Xeloda  08/31/2019 Cycle 7 adjuvant Xeloda  09/20/2020 Cycle 8 adjuvant Xeloda  10/11/2020 CTs 03/07/2021-no evidence of recurrent disease, hepatic steatosis, slight wall thickening at the junction of the descending and horizontal duodenum Mild elevation of CEA 2023 12/01/2021-Guardant Reveal-ct DNA detected PET 12/30/2021-hypermetabolic right liver lesion, hypermetabolic area in a loop of mid small bowel with an SUV of 12.5, review of PET images at GI tumor conference 01/11/2022-small bowel uptake felt to be a benign finding MRI liver 01/10/2022-solitary 1.2 cm right liver lesion between segments 7 and 8 compatible with metastasis, subtle changes suggesting cirrhosis, hepatic steatosis CT enteroscopy 02/02/2022-small bowel loop with hypermetabolic activity on PET continues to have a bandlike density along the margin felt to represent a diverticulum or adjacent nodal tissue.  Small bowel tumor not excluded.  3-4 mm left omental nodule 02/08/2022-biopsy and ablation of solitary liver lesion, adenocarcinoma consistent with metastatic colorectal adenocarcinoma CTs 03/31/2022-unchanged circumstantial soft tissue  thickening surrounding a loop of mid to distal small bowel with an adjacent nodular focus measuring 1.2 cm.,   Ablation cavity in segment 7, no other evidence of metastatic disease Diagnostic laparoscopy 05/26/2022-mid small bowel mass-resected, well to moderately differentiated adenocarcinoma, 0/2 lymph nodes, morphology consistent with a colon primary, tumor involved full-thickness including serosa with perineural and large vessel involvement; foundation 1-microsatellite stable, tumor mutation burden 2, K-ras Q61H CT abdomen/pelvis 07/10/2022-the right liver ablation defect is smaller, new subtle right liver lesion measuring 13 x 9 mm, enlarged lymph node in the right upper quadrant mesentery adjacent to surgical anastomosis PET 07/26/2022-new segment 6 liver lesion, enlarging hypermetabolic left omental lesion, there is another mildly hypermetabolic peritoneal implant, 7 mm omental nodule at the midline without hypermetabolism Cycle 1 FOLFOX 09/25/2022 Cycle 2 FOLFOX/bevacizumab  10/09/2022, 5-FU dose reduced secondary to mucositis Cycle 3 FOLFOX/bevacizumab  10/23/2022 Cycle 4 FOLFOX/bevacizumab  11/06/2022 Cycle 5 FOLFOX/bevacizumab  11/20/2022 Cycle 6 FOLFOX/bevacizumab  12/04/2022 CT abdomen/pelvis 12/14/2022-further contraction of the treated segment 7 lesion, slight enlargement of segment 6 lesion, no new liver lesion, increased size of ileocolic nodes and an omental lesion, stable small retroperitoneal nodes Cycle 7 FOLFOX/bevacizumab  12/18/2022 Cycle 8 FOLFOX/bevacizumab  01/02/2023, oxaliplatin  dose reduced secondary to prolonged nausea and neuropathy symptoms, Emend added Cycle 9 FOLFOX/bevacizumab  01/16/2023, oxaliplatin  discontinued secondary to neuropathy Cycle 1 FOLFIRI 02/19/2023, Avastin  held due to recent dental extractions Cycle 2 FOLFIRI/Avastin  03/05/2023 Cycle 3 FOLFIRI/Avastin  03/19/2023, 5-FU bolus held due to mucositis after cycle 2, Fulphila  added Cycle 4 FOLFIRI/Avastin  04/02/2023, Fulphila , Emend Cycle 5 FOLFIRI/Avastin  04/16/2023, Fulphila , Emend CTs 05/02/2023-similar ileocolic mesenteric adenopathy  and omental metastasis, cirrhosis with portal hypertension, stable liver lesions, no new or progressive disease Cycle 6 FOLFIRI/Avastin  05/07/2023, Fulphila , Emend Cycle 7 FOLFIRI/Avastin  05/21/2023, Fulphila , Emend Cycle 8 FOLFIRI 06/04/2023, Fulphila , Emend; Avastin  held Cycle 9 FOLFIRI 06/18/2023, Fulphila , Emend, Avastin  held Treatment held 07/09/2023 patient request Cycle 10 FOLFIRI 07/18/2023, Fulphila , Emend, Avastin  held CTs 07/31/2023: Stable with unchanged hepatic lesions, mesenteric lymph nodes, and left omental nodule, unchanged morphologic changes of cirrhosis and portal hypertension Cycle 11 FOLFIRI/Avastin  08/01/2023, Fulphila  Chemotherapy held 08/15/2023 per patient request Cycle 12 FOLFIRI/Avastin  08/30/2023, Udenyca  Cycle 13 FOLFIRI/Avastin  09/19/2023, Udenyca  Cycle 14 FOLFIRI/Avastin  10/01/2023, Udenyca  Cycle 15 FOLFIRI/Avastin  10/22/2023, Udenyca  11/05/2023 treatment held per patient request due to dental issue Cycle 16 FOLFIRI 11/19/2023, Udenyca , Avastin  held secondary to planned dental extractions CTs 11/28/2023: No evidence of progressive disease, stable hepatic, left upper quadrant omental, and ileocolic mesenteric lesions Cycle 17 FOLFIRI/Avastin  12/10/2023 Cycle 18 FOLFIRI 12/31/2023, Avastin  held secondary to 12/27/2023 tooth extraction Cycle 19 FOLFIRI/Avastin  01/22/2024 Cycle 20 FOLFIRI/Avastin  02/12/2024 03/08/2024 CTs: Stable small liver lesions, stable peritoneal nodules, stable left omental soft tissue mass, no evidence of disease progression, cirrhosis/portal hypertension Cycle 21 FOLFIRI/bevacizumab  03/17/2024 Treatment held 04/07/2024 per patient request Cycle 22 FOLFIRI/bevacizumab  04/14/2024  Microcytic anemia secondary to #1-improved Colon polyps-sessile serrated adenoma of the descending colon, tubular adenomas with focal high-grade dysplasia of the transverse colon on colonoscopy 02/12/2020, tubular adenoma of the mid ascending colon on the surgical specimen 04/12/2020 Family  history of colon cancer Depression Anxiety/panic attacks Hypertension Hyperlipidemia Nausea secondary to Xeloda ?-Resolved Gastroesophageal reflux disease-improved following discontinuation of Xeloda  Anemia, microcytic; ferritin 6 01/24/2022; stool positive for blood x3 01/25/2022; 02/02/2022 CT abdomen/pelvis enterography-bowel loop demonstrating hypermetabolic activity continues to have a bandlike density along its margin possibly representing a diverticulum or adjacent nodal tissue, difficult to exclude small bowel tumor, 3 x 4 mm left omental nodule or lymph node, known right hepatic lobe tumor is occult on CT. Venofer   weekly x3 beginning 03/03/2022 Negative capsule endoscopy 03/09/2022 Venofer  03/03/2022 for 3 weekly doses Venofer  05/03/2022, 05/19/2022 Venofer  07/28/2022, 08/04/2022 Venofer  03/22/2023, 03/29/2023, 04/04/2023 Venofer  06/18/2023, 07/09/2023 07/03/2023 upper endoscopy and colonoscopy-severe portal hypertensive gastropathy in the gastric fundus, gastric body, stomach, red blood found greater curvature of the stomach and gastric antrum, treated with APC; colonoscopy showed dark old maroon-colored blood in the entire colon.  No evidence of active or recent bleeding in the colon. 12.  Anterior left neck nodule 03/17/2022-cyst?,  Lymph node? 13.  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  He was last treated with chemotherapy 04/14/2024.  He has persistent malaise and abdominal bloating.  Treatment will be held today.  He will call for increased symptoms.  He will return for an office visit in 1 week.  We will refer him for restaging CTs if his  symptoms are not improved by next week. We will follow-up on the ferritin level from today and add IV iron  as indicated. Arley Hof, MD  05/05/2024  9:26 AM

## 2024-05-05 NOTE — Telephone Encounter (Signed)
-----   Message from Arley Hof sent at 05/05/2024  1:23 PM EDT ----- Please call patient, the iron  level remains low, scheduled for IV iron  when he is here next week  ----- Message ----- From: Rebecka, [facility] [place] Sent: 05/05/2024  10:38 AM EDT To: Arley KATHEE Hof, MD

## 2024-05-05 NOTE — Telephone Encounter (Signed)
 Patient gave verbal understanding and had no further questions or concerns

## 2024-05-06 ENCOUNTER — Other Ambulatory Visit: Payer: Self-pay

## 2024-05-06 ENCOUNTER — Encounter: Payer: Self-pay | Admitting: Oncology

## 2024-05-07 ENCOUNTER — Inpatient Hospital Stay

## 2024-05-08 ENCOUNTER — Encounter: Payer: Self-pay | Admitting: Oncology

## 2024-05-09 ENCOUNTER — Other Ambulatory Visit: Payer: Self-pay

## 2024-05-12 ENCOUNTER — Inpatient Hospital Stay

## 2024-05-12 ENCOUNTER — Other Ambulatory Visit

## 2024-05-12 ENCOUNTER — Inpatient Hospital Stay (HOSPITAL_BASED_OUTPATIENT_CLINIC_OR_DEPARTMENT_OTHER): Admitting: Oncology

## 2024-05-12 VITALS — BP 136/72 | HR 78 | Temp 98.3°F | Resp 18

## 2024-05-12 VITALS — BP 124/64 | HR 87 | Temp 97.8°F | Resp 18 | Ht 66.0 in | Wt 247.0 lb

## 2024-05-12 DIAGNOSIS — Z5111 Encounter for antineoplastic chemotherapy: Secondary | ICD-10-CM | POA: Diagnosis not present

## 2024-05-12 DIAGNOSIS — C787 Secondary malignant neoplasm of liver and intrahepatic bile duct: Secondary | ICD-10-CM | POA: Diagnosis not present

## 2024-05-12 DIAGNOSIS — Z79899 Other long term (current) drug therapy: Secondary | ICD-10-CM | POA: Diagnosis not present

## 2024-05-12 DIAGNOSIS — C182 Malignant neoplasm of ascending colon: Secondary | ICD-10-CM | POA: Diagnosis not present

## 2024-05-12 DIAGNOSIS — D509 Iron deficiency anemia, unspecified: Secondary | ICD-10-CM | POA: Diagnosis not present

## 2024-05-12 DIAGNOSIS — C189 Malignant neoplasm of colon, unspecified: Secondary | ICD-10-CM

## 2024-05-12 LAB — CBC WITH DIFFERENTIAL (CANCER CENTER ONLY)
Abs Immature Granulocytes: 0.01 K/uL (ref 0.00–0.07)
Basophils Absolute: 0.1 K/uL (ref 0.0–0.1)
Basophils Relative: 1 %
Eosinophils Absolute: 0.1 K/uL (ref 0.0–0.5)
Eosinophils Relative: 2 %
HCT: 32 % — ABNORMAL LOW (ref 39.0–52.0)
Hemoglobin: 9.6 g/dL — ABNORMAL LOW (ref 13.0–17.0)
Immature Granulocytes: 0 %
Lymphocytes Relative: 15 %
Lymphs Abs: 0.8 K/uL (ref 0.7–4.0)
MCH: 21.5 pg — ABNORMAL LOW (ref 26.0–34.0)
MCHC: 30 g/dL (ref 30.0–36.0)
MCV: 71.7 fL — ABNORMAL LOW (ref 80.0–100.0)
Monocytes Absolute: 0.4 K/uL (ref 0.1–1.0)
Monocytes Relative: 7 %
Neutro Abs: 4.2 K/uL (ref 1.7–7.7)
Neutrophils Relative %: 75 %
Platelet Count: 197 K/uL (ref 150–400)
RBC: 4.46 MIL/uL (ref 4.22–5.81)
RDW: 19.3 % — ABNORMAL HIGH (ref 11.5–15.5)
WBC Count: 5.6 K/uL (ref 4.0–10.5)
nRBC: 0 % (ref 0.0–0.2)

## 2024-05-12 LAB — CEA (ACCESS): CEA (CHCC): 55.46 ng/mL — ABNORMAL HIGH (ref 0.00–5.00)

## 2024-05-12 LAB — CMP (CANCER CENTER ONLY)
ALT: 26 U/L (ref 0–44)
AST: 32 U/L (ref 15–41)
Albumin: 4.1 g/dL (ref 3.5–5.0)
Alkaline Phosphatase: 163 U/L — ABNORMAL HIGH (ref 38–126)
Anion gap: 13 (ref 5–15)
BUN: 12 mg/dL (ref 6–20)
CO2: 22 mmol/L (ref 22–32)
Calcium: 9 mg/dL (ref 8.9–10.3)
Chloride: 99 mmol/L (ref 98–111)
Creatinine: 0.87 mg/dL (ref 0.61–1.24)
GFR, Estimated: >60 mL/min (ref >60)
Glucose, Bld: 345 mg/dL — ABNORMAL HIGH (ref 70–99)
Potassium: 3.9 mmol/L (ref 3.5–5.1)
Sodium: 134 mmol/L — ABNORMAL LOW (ref 135–145)
Total Bilirubin: 0.8 mg/dL (ref 0.0–1.2)
Total Protein: 7.3 g/dL (ref 6.5–8.1)

## 2024-05-12 LAB — TOTAL PROTEIN, URINE DIPSTICK: Protein, ur: NEGATIVE mg/dL

## 2024-05-12 MED ORDER — SODIUM CHLORIDE 0.9 % IV SOLN: INTRAVENOUS | Status: DC

## 2024-05-12 MED ORDER — SODIUM CHLORIDE 0.9 % IV SOLN
300.0000 mg | Freq: Once | INTRAVENOUS | Status: AC
  Administered 2024-05-12: 300 mg via INTRAVENOUS
  Filled 2024-05-12: qty 5

## 2024-05-12 MED ORDER — IRON SUCROSE 300 MG IVPB - SIMPLE MED
300.0000 mg | Freq: Once | Status: DC
  Filled 2024-05-12: qty 265

## 2024-05-12 NOTE — Progress Notes (Signed)
 Patient seen by Dr. Arley Hof today  Vitals are within treatment parameters:Yes   Labs are within treatment parameters: Yes   Treatment plan has been signed: Yes   Per physician team, Patient is ready for treatment. Please note the following modifications: Patient has declined chemotherapy and will only receive IV iron  today.

## 2024-05-12 NOTE — Patient Instructions (Signed)

## 2024-05-12 NOTE — Progress Notes (Signed)
 [facility] OFFICE PROGRESS NOTE   Diagnosis: Colon cancer  INTERVAL HISTORY:   Mr. Ribaudo returns as scheduled.  He complains of malaise, hunger pains , nausea, and abdominal distention.  No bleeding.  He is having bowel movements.  He states in the house most of the time.  No emesis.  The tongue has been sore.  Objective:  Vital signs in last 24 hours:  Blood pressure 124/64, pulse 87, temperature 97.8 F (36.6 C), temperature source Temporal, resp. rate 18, height 5' 6 (1.676 m), weight 247 lb (112 kg), SpO2 98%.    HEENT: No thrush or ulcers Resp: Lungs clear bilaterally Cardio: Regular rate and rhythm GI: Mildly distended, mild tenderness in the mid upper abdomen, no mass, no hepatosplenomegaly, no apparent ascites Vascular: No leg edema  Portacath/PICC-without erythema  Lab Results:  Lab Results  Component Value Date   WBC 6.1 05/05/2024   HGB 9.9 (L) 05/05/2024   HCT 33.7 (L) 05/05/2024   MCV 74.7 (L) 05/05/2024   PLT 180 05/05/2024   NEUTROABS 4.4 05/05/2024    CMP  Lab Results  Component Value Date   NA 136 05/05/2024   K 3.8 05/05/2024   CL 100 05/05/2024   CO2 23 05/05/2024   GLUCOSE 232 (H) 05/05/2024   BUN 7 05/05/2024   CREATININE 0.85 05/05/2024   CALCIUM  9.1 05/05/2024   PROT 7.4 05/05/2024   ALBUMIN 4.1 05/05/2024   AST 34 05/05/2024   ALT 32 05/05/2024   ALKPHOS 177 (H) 05/05/2024   BILITOT 1.0 05/05/2024   GFRNONAA >60 05/05/2024   GFRAA >60 05/10/2020    Lab Results  Component Value Date   CEA1 1.08 03/07/2021   CEA 27.89 (H) 04/07/2024    Lab Results  Component Value Date   INR 1.0 02/08/2022   LABPROT 13.5 02/08/2022    Imaging:  No results found.  Medications: I have reviewed the patient's current medications.   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 secondary to nausea (patient discontinued Xeloda  on day 11 due to colonoscopy) Colonoscopy 07/09/2020-nodular mucosa at the colonic anastomosis, biopsied; patent end-to-side ileocolonic anastomosis characterized by healthy-appearing mucosa and visible sutures; nonbleeding internal hemorrhoids, biopsy with benign ulcerated anastomotic mucosa with granulation tissue Cycle 4 adjuvant Xeloda  07/19/2020, 2000 mg every morning and 1500 mg every afternoon Cycle 5 adjuvant Xeloda  08/09/2020 Cycle 6 adjuvant Xeloda  08/31/2019 Cycle 7 adjuvant Xeloda  09/20/2020 Cycle 8 adjuvant Xeloda  10/11/2020 CTs 03/07/2021-no evidence of recurrent disease, hepatic steatosis, slight wall thickening at the junction of the descending and horizontal duodenum Mild elevation of CEA 2023 12/01/2021-Guardant Reveal-ct DNA detected PET 12/30/2021-hypermetabolic right liver lesion, hypermetabolic area in a loop of mid small bowel with an SUV of 12.5, review of PET images at GI tumor conference 01/11/2022-small bowel uptake felt to be a benign finding MRI liver 01/10/2022-solitary 1.2 cm right liver lesion between segments 7 and 8 compatible with metastasis, subtle changes suggesting cirrhosis, hepatic steatosis CT enteroscopy 02/02/2022-small bowel loop with hypermetabolic activity on PET continues to have a bandlike density along the margin felt to represent a diverticulum  or adjacent nodal tissue.  Small bowel tumor not excluded.  3-4 mm left omental nodule 02/08/2022-biopsy and ablation of solitary liver lesion, adenocarcinoma consistent with metastatic colorectal adenocarcinoma CTs  03/31/2022-unchanged circumstantial soft tissue thickening surrounding a loop of mid to distal small bowel with an adjacent nodular focus measuring 1.2 cm.,  Ablation cavity in segment 7, no other evidence of metastatic disease Diagnostic laparoscopy 05/26/2022-mid small bowel mass-resected, well to moderately differentiated adenocarcinoma, 0/2 lymph nodes, morphology consistent with a colon primary, tumor involved full-thickness including serosa with perineural and large vessel involvement; foundation 1-microsatellite stable, tumor mutation burden 2, K-ras Q61H CT abdomen/pelvis 07/10/2022-the right liver ablation defect is smaller, new subtle right liver lesion measuring 13 x 9 mm, enlarged lymph node in the right upper quadrant mesentery adjacent to surgical anastomosis PET 07/26/2022-new segment 6 liver lesion, enlarging hypermetabolic left omental lesion, there is another mildly hypermetabolic peritoneal implant, 7 mm omental nodule at the midline without hypermetabolism Cycle 1 FOLFOX 09/25/2022 Cycle 2 FOLFOX/bevacizumab  10/09/2022, 5-FU dose reduced secondary to mucositis Cycle 3 FOLFOX/bevacizumab  10/23/2022 Cycle 4 FOLFOX/bevacizumab  11/06/2022 Cycle 5 FOLFOX/bevacizumab  11/20/2022 Cycle 6 FOLFOX/bevacizumab  12/04/2022 CT abdomen/pelvis 12/14/2022-further contraction of the treated segment 7 lesion, slight enlargement of segment 6 lesion, no new liver lesion, increased size of ileocolic nodes and an omental lesion, stable small retroperitoneal nodes Cycle 7 FOLFOX/bevacizumab  12/18/2022 Cycle 8 FOLFOX/bevacizumab  01/02/2023, oxaliplatin  dose reduced secondary to prolonged nausea and neuropathy symptoms, Emend added Cycle 9 FOLFOX/bevacizumab  01/16/2023, oxaliplatin  discontinued secondary to neuropathy Cycle 1 FOLFIRI 02/19/2023, Avastin  held due to recent dental extractions Cycle 2 FOLFIRI/Avastin  03/05/2023 Cycle 3 FOLFIRI/Avastin  03/19/2023, 5-FU bolus held due to mucositis after cycle 2, Fulphila   added Cycle 4 FOLFIRI/Avastin  04/02/2023, Fulphila , Emend Cycle 5 FOLFIRI/Avastin  04/16/2023, Fulphila , Emend CTs 05/02/2023-similar ileocolic mesenteric adenopathy and omental metastasis, cirrhosis with portal hypertension, stable liver lesions, no new or progressive disease Cycle 6 FOLFIRI/Avastin  05/07/2023, Fulphila , Emend Cycle 7 FOLFIRI/Avastin  05/21/2023, Fulphila , Emend Cycle 8 FOLFIRI 06/04/2023, Fulphila , Emend; Avastin  held Cycle 9 FOLFIRI 06/18/2023, Fulphila , Emend, Avastin  held Treatment held 07/09/2023 patient request Cycle 10 FOLFIRI 07/18/2023, Fulphila , Emend, Avastin  held CTs 07/31/2023: Stable with unchanged hepatic lesions, mesenteric lymph nodes, and left omental nodule, unchanged morphologic changes of cirrhosis and portal hypertension Cycle 11 FOLFIRI/Avastin  08/01/2023, Fulphila  Chemotherapy held 08/15/2023 per patient request Cycle 12 FOLFIRI/Avastin  08/30/2023, Udenyca  Cycle 13 FOLFIRI/Avastin  09/19/2023, Udenyca  Cycle 14 FOLFIRI/Avastin  10/01/2023, Udenyca  Cycle 15 FOLFIRI/Avastin  10/22/2023, Udenyca  11/05/2023 treatment held per patient request due to dental issue Cycle 16 FOLFIRI 11/19/2023, Udenyca , Avastin  held secondary to planned dental extractions CTs 11/28/2023: No evidence of progressive disease, stable hepatic, left upper quadrant omental, and ileocolic mesenteric lesions Cycle 17 FOLFIRI/Avastin  12/10/2023 Cycle 18 FOLFIRI 12/31/2023, Avastin  held secondary to 12/27/2023 tooth extraction Cycle 19 FOLFIRI/Avastin  01/22/2024 Cycle 20 FOLFIRI/Avastin  02/12/2024 03/08/2024 CTs: Stable small liver lesions, stable peritoneal nodules, stable left omental soft tissue mass, no evidence of disease progression, cirrhosis/portal hypertension Cycle 21 FOLFIRI/bevacizumab  03/17/2024 Treatment held 04/07/2024 per patient request Cycle 22 FOLFIRI/bevacizumab  04/14/2024  Microcytic anemia secondary to #1-improved Colon polyps-sessile serrated adenoma of the descending colon, tubular adenomas with  focal high-grade dysplasia of the transverse colon on colonoscopy 02/12/2020, tubular adenoma of the mid ascending colon on the surgical specimen 04/12/2020 Family history of colon cancer Depression Anxiety/panic attacks Hypertension Hyperlipidemia Nausea secondary to Xeloda ?-Resolved Gastroesophageal reflux disease-improved following discontinuation of Xeloda  Anemia, microcytic; ferritin 6 01/24/2022; stool positive for blood x3 01/25/2022; 02/02/2022 CT abdomen/pelvis enterography-bowel loop demonstrating hypermetabolic activity continues to have a bandlike density along its margin  possibly representing a diverticulum or adjacent nodal tissue, difficult to exclude small bowel tumor, 3 x 4 mm left omental nodule or lymph node, known right hepatic lobe tumor is occult on CT. Venofer  weekly x3 beginning 03/03/2022 Negative capsule endoscopy 03/09/2022 Venofer  03/03/2022 for 3 weekly doses Venofer  05/03/2022, 05/19/2022 Venofer  07/28/2022, 08/04/2022 Venofer  03/22/2023, 03/29/2023, 04/04/2023 Venofer  06/18/2023, 07/09/2023 07/03/2023 upper endoscopy and colonoscopy-severe portal hypertensive gastropathy in the gastric fundus, gastric body, stomach, red blood found greater curvature of the stomach and gastric antrum, treated with APC; colonoscopy showed dark old maroon-colored blood in the entire colon.  No evidence of active or recent bleeding in the colon. 12.  Anterior left neck nodule 03/17/2022-cyst?,  Lymph node? 13.  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 Winemiller has metastatic colon cancer.  He has been maintained on FOLFIRI/bevacizumab  since the summer 2024.  He last underwent  restaging CTs in July.  He has developed abdominal distention and nausea for the past month.  He will be referred for restaging CTs later this week.  He has iron  deficiency anemia.  He will receive IV iron  today.  Mr. Bunn will return for an office visit on 05/19/2024.  FOLFIRI/bevacizumab  will be resumed 05/19/2024 if the CTs showed no evidence of disease progression.  Arley Hof, MD  05/12/2024  8:35 AM

## 2024-05-13 ENCOUNTER — Other Ambulatory Visit: Payer: Self-pay

## 2024-05-13 ENCOUNTER — Encounter: Payer: Self-pay | Admitting: Oncology

## 2024-05-13 ENCOUNTER — Telehealth: Payer: Self-pay

## 2024-05-13 NOTE — Telephone Encounter (Signed)
 [facility] Clinical Social Work  Clinical Social Work was referred by medical provider for assessment of psychosocial needs.  Clinical Social Worker attempted to contact patient by phone to offer support and assess for needs.  CSW and patient spoke briefly, appointment scheduled for 9/19 to discuss referral.      Follow Up Plan:  appointment scheduled for 9/19 to discuss referral.    Lizbeth Sprague, LCSW  Clinical Social Worker [facility]

## 2024-05-14 ENCOUNTER — Encounter: Payer: Self-pay | Admitting: Oncology

## 2024-05-14 ENCOUNTER — Ambulatory Visit (HOSPITAL_BASED_OUTPATIENT_CLINIC_OR_DEPARTMENT_OTHER)
Admission: RE | Admit: 2024-05-14 | Discharge: 2024-05-14 | Disposition: A | Discharge status: Home / Self Care | Source: Ambulatory Visit | Attending: Oncology | Admitting: Oncology

## 2024-05-14 ENCOUNTER — Inpatient Hospital Stay

## 2024-05-14 DIAGNOSIS — C787 Secondary malignant neoplasm of liver and intrahepatic bile duct: Secondary | ICD-10-CM | POA: Diagnosis not present

## 2024-05-14 DIAGNOSIS — K760 Fatty (change of) liver, not elsewhere classified: Secondary | ICD-10-CM | POA: Diagnosis not present

## 2024-05-14 DIAGNOSIS — C786 Secondary malignant neoplasm of retroperitoneum and peritoneum: Secondary | ICD-10-CM | POA: Diagnosis not present

## 2024-05-14 DIAGNOSIS — C189 Malignant neoplasm of colon, unspecified: Secondary | ICD-10-CM | POA: Diagnosis not present

## 2024-05-14 MED ORDER — IOHEXOL 300 MG/ML  SOLN
100.0000 mL | Freq: Once | INTRAMUSCULAR | Status: AC | PRN
Start: 2024-05-14 — End: 2024-05-14
  Administered 2024-05-14: 100 mL via INTRAVENOUS

## 2024-05-15 ENCOUNTER — Other Ambulatory Visit: Payer: Self-pay

## 2024-05-16 ENCOUNTER — Telehealth: Payer: Self-pay

## 2024-05-16 ENCOUNTER — Inpatient Hospital Stay

## 2024-05-16 NOTE — Telephone Encounter (Signed)
 Pt called to cancel phone visit with Lizbeth Sprague due to not feeling well

## 2024-05-16 NOTE — Progress Notes (Signed)
 [facility] Clinical Social Work  Clinical Social Work was referred by medical provider for emotional support.  Clinical Social Worker contacted patient by phone to briefly talk due to cancelled scheduled appointment. Patient not feeling well this morning and confirmed interest in being evaluated for counseling through the cancer center. Initial Appointment scheduled for 9/26 at [facility].     Lizbeth Sprague, LCSW  Clinical Social Worker [facility] [phone]

## 2024-05-17 ENCOUNTER — Other Ambulatory Visit: Payer: Self-pay

## 2024-05-19 ENCOUNTER — Inpatient Hospital Stay

## 2024-05-19 ENCOUNTER — Inpatient Hospital Stay (HOSPITAL_BASED_OUTPATIENT_CLINIC_OR_DEPARTMENT_OTHER): Admitting: Nurse Practitioner

## 2024-05-19 ENCOUNTER — Encounter: Payer: Self-pay | Admitting: Oncology

## 2024-05-19 ENCOUNTER — Inpatient Hospital Stay: Admitting: Nutrition

## 2024-05-19 ENCOUNTER — Encounter: Payer: Self-pay | Admitting: Nurse Practitioner

## 2024-05-19 VITALS — BP 109/65 | HR 82 | Temp 98.2°F | Resp 18 | Ht 66.0 in | Wt 249.5 lb

## 2024-05-19 VITALS — BP 124/82 | HR 85 | Temp 98.2°F | Resp 18

## 2024-05-19 DIAGNOSIS — C787 Secondary malignant neoplasm of liver and intrahepatic bile duct: Secondary | ICD-10-CM | POA: Diagnosis not present

## 2024-05-19 DIAGNOSIS — C189 Malignant neoplasm of colon, unspecified: Secondary | ICD-10-CM

## 2024-05-19 DIAGNOSIS — D509 Iron deficiency anemia, unspecified: Secondary | ICD-10-CM | POA: Diagnosis not present

## 2024-05-19 DIAGNOSIS — Z5111 Encounter for antineoplastic chemotherapy: Secondary | ICD-10-CM | POA: Diagnosis not present

## 2024-05-19 DIAGNOSIS — Z79899 Other long term (current) drug therapy: Secondary | ICD-10-CM | POA: Diagnosis not present

## 2024-05-19 DIAGNOSIS — C182 Malignant neoplasm of ascending colon: Secondary | ICD-10-CM | POA: Diagnosis not present

## 2024-05-19 LAB — CMP (CANCER CENTER ONLY)
ALT: 32 U/L (ref 0–44)
AST: 35 U/L (ref 15–41)
Albumin: 4.1 g/dL (ref 3.5–5.0)
Alkaline Phosphatase: 177 U/L — ABNORMAL HIGH (ref 38–126)
Anion gap: 12 (ref 5–15)
BUN: 11 mg/dL (ref 6–20)
CO2: 23 mmol/L (ref 22–32)
Calcium: 9.1 mg/dL (ref 8.9–10.3)
Chloride: 98 mmol/L (ref 98–111)
Creatinine: 0.82 mg/dL (ref 0.61–1.24)
GFR, Estimated: >60 mL/min (ref >60)
Glucose, Bld: 356 mg/dL — ABNORMAL HIGH (ref 70–99)
Potassium: 4 mmol/L (ref 3.5–5.1)
Sodium: 132 mmol/L — ABNORMAL LOW (ref 135–145)
Total Bilirubin: 0.8 mg/dL (ref 0.0–1.2)
Total Protein: 7.4 g/dL (ref 6.5–8.1)

## 2024-05-19 LAB — CBC WITH DIFFERENTIAL (CANCER CENTER ONLY)
Abs Immature Granulocytes: 0.01 K/uL (ref 0.00–0.07)
Basophils Absolute: 0.1 K/uL (ref 0.0–0.1)
Basophils Relative: 1 %
Eosinophils Absolute: 0.1 K/uL (ref 0.0–0.5)
Eosinophils Relative: 3 %
HCT: 34 % — ABNORMAL LOW (ref 39.0–52.0)
Hemoglobin: 9.8 g/dL — ABNORMAL LOW (ref 13.0–17.0)
Immature Granulocytes: 0 %
Lymphocytes Relative: 14 %
Lymphs Abs: 0.6 K/uL — ABNORMAL LOW (ref 0.7–4.0)
MCH: 21.7 pg — ABNORMAL LOW (ref 26.0–34.0)
MCHC: 28.8 g/dL — ABNORMAL LOW (ref 30.0–36.0)
MCV: 75.2 fL — ABNORMAL LOW (ref 80.0–100.0)
Monocytes Absolute: 0.3 K/uL (ref 0.1–1.0)
Monocytes Relative: 7 %
Neutro Abs: 3.1 K/uL (ref 1.7–7.7)
Neutrophils Relative %: 75 %
Platelet Count: 144 K/uL — ABNORMAL LOW (ref 150–400)
RBC: 4.52 MIL/uL (ref 4.22–5.81)
RDW: 20.5 % — ABNORMAL HIGH (ref 11.5–15.5)
WBC Count: 4.2 K/uL (ref 4.0–10.5)
nRBC: 0 % (ref 0.0–0.2)

## 2024-05-19 LAB — TOTAL PROTEIN, URINE DIPSTICK

## 2024-05-19 LAB — CEA (ACCESS): CEA (CHCC): 94.37 ng/mL — ABNORMAL HIGH (ref 0.00–5.00)

## 2024-05-19 MED ORDER — PALONOSETRON HCL INJECTION 0.25 MG/5ML
0.2500 mg | Freq: Once | INTRAVENOUS | Status: AC
  Administered 2024-05-19: 0.25 mg via INTRAVENOUS
  Filled 2024-05-19: qty 5

## 2024-05-19 MED ORDER — SODIUM CHLORIDE 0.9 % IV SOLN
200.0000 mg/m2 | Freq: Once | INTRAVENOUS | Status: AC
  Administered 2024-05-19: 444 mg via INTRAVENOUS
  Filled 2024-05-19: qty 22.2

## 2024-05-19 MED ORDER — SODIUM CHLORIDE 0.9 % IV SOLN
135.0000 mg/m2 | Freq: Once | INTRAVENOUS | Status: AC
  Administered 2024-05-19: 300 mg via INTRAVENOUS
  Filled 2024-05-19: qty 15

## 2024-05-19 MED ORDER — ATROPINE SULFATE 1 MG/ML IV SOLN
0.5000 mg | Freq: Once | INTRAVENOUS | Status: AC | PRN
  Administered 2024-05-19: 0.5 mg via INTRAVENOUS
  Filled 2024-05-19: qty 1

## 2024-05-19 MED ORDER — SODIUM CHLORIDE 0.9 % IV SOLN
150.0000 mg | Freq: Once | INTRAVENOUS | Status: AC
  Administered 2024-05-19: 150 mg via INTRAVENOUS
  Filled 2024-05-19: qty 150

## 2024-05-19 MED ORDER — SODIUM CHLORIDE 0.9 % IV SOLN: Freq: Once | INTRAVENOUS | Status: AC

## 2024-05-19 MED ORDER — FLUOROURACIL CHEMO INJECTION 500 MG/10ML
200.0000 mg/m2 | Freq: Once | INTRAVENOUS | Status: AC
  Administered 2024-05-19: 450 mg via INTRAVENOUS
  Filled 2024-05-19: qty 9

## 2024-05-19 MED ORDER — SODIUM CHLORIDE 0.9 % IV SOLN
1600.0000 mg/m2 | INTRAVENOUS | Status: DC
  Administered 2024-05-19: 3500 mg via INTRAVENOUS
  Filled 2024-05-19: qty 70

## 2024-05-19 MED ORDER — DEXAMETHASONE SODIUM PHOSPHATE 10 MG/ML IJ SOLN
10.0000 mg | Freq: Once | INTRAMUSCULAR | Status: AC
  Administered 2024-05-19: 10 mg via INTRAVENOUS
  Filled 2024-05-19: qty 1

## 2024-05-19 MED ORDER — SODIUM CHLORIDE 0.9 % IV SOLN
5.0000 mg/kg | Freq: Once | INTRAVENOUS | Status: AC
  Administered 2024-05-19: 600 mg via INTRAVENOUS
  Filled 2024-05-19: qty 16

## 2024-05-19 NOTE — Progress Notes (Signed)
 Nutrition follow up completed with patient during infusion for metastatic colon cancer. He is followed by Dr. Cloretta and receives FOLFIRI and Bevacizumab . Recent CT shows stable disease. Receives cycle 23 today.  Weight: 249 pounds 8 oz September 22 251 pounds 8 oz July 21 248 pounds 1.6 oz June 16 247 pounds 3.2 oz May 5.  Labs include sodium 132, Glucose 356  Medications include Humalog and Lantus  Reports he is doing better overall. Abdomen less distended and bowels are moving. His appetite is good. States he tries to eat the right things. Blood sugars still elevated. Acknowledges he feels better when his blood sugars are controlled. Tries to drink more water. He historically drinks sugary drinks/sodas. He does not like artificial sweeteners.  Nutrition Diagnosis: Food and Nutrition Related Knowledge Deficit stable.  Intervention: Encouraged patient to minimize concentrated sweets and continue medications to improve glycemic control. Discouraged regular soft drinks. Patient should follow up with Nutrition and Diabetes Education Services for additional diet information if needed. (Patient instructed multiple times and given fact sheets and sample menus) May need medication adjustment.  Monitoring, Evaluation, Goals: Tolerate adequate calories and protein for weight stability with improved glycemic control.  No follow up scheduled.

## 2024-05-19 NOTE — Progress Notes (Signed)
 Anthony Calhoun OFFICE PROGRESS NOTE   Diagnosis: Colon cancer  INTERVAL HISTORY:   Anthony Calhoun returns as scheduled.  He is feeling better.  Abdomen is less distended.  Bowels are moving.  Same intermittent nausea.  Good appetite.  Tongue soreness with certain foods.  Last night he developed achy neck and upper arm pain.  He feels weak in his shoulders.  Arm strength seems to be intact.  No numbness.  Symptoms are better this morning.  No fever, chills, sweats.  Objective:  Vital signs in last 24 hours:  Blood pressure 109/65, pulse 82, temperature 98.2 F (36.8 C), temperature source Temporal, resp. rate 18, height 5' 6 (1.676 m), weight 249 lb 8 oz (113.2 kg), SpO2 98%.    HEENT: No thrush or ulcers. Resp: Lungs clear bilaterally. Cardio: Regular rate and rhythm.   GI: No hepatosplenomegaly.  Nontender.  No apparent ascites. Vascular: No leg edema. Neuro: Intact shoulder shrug.  Upper extremity motor strength 5/5.  Neck with good range of motion, able to touch chin to chest. Skin: No rash. Port-A-Cath without erythema.  Lab Results:  Lab Results  Component Value Date   WBC 4.2 05/19/2024   HGB 9.8 (L) 05/19/2024   HCT 34.0 (L) 05/19/2024   MCV 75.2 (L) 05/19/2024   PLT 144 (L) 05/19/2024   NEUTROABS 3.1 05/19/2024    Imaging:  No results found.  Medications: I have reviewed the patient's current medications.  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 secondary to  nausea (patient discontinued Xeloda  on day 11 due to colonoscopy) Colonoscopy 07/09/2020-nodular mucosa at the colonic anastomosis, biopsied; patent end-to-side ileocolonic anastomosis characterized by healthy-appearing mucosa and visible sutures; nonbleeding internal hemorrhoids, biopsy with benign ulcerated anastomotic mucosa with granulation tissue Cycle 4 adjuvant Xeloda  07/19/2020, 2000 mg every morning and 1500 mg every afternoon Cycle 5 adjuvant Xeloda  08/09/2020 Cycle 6 adjuvant Xeloda  08/31/2019 Cycle 7 adjuvant Xeloda  09/20/2020 Cycle 8 adjuvant Xeloda  10/11/2020 CTs 03/07/2021-no evidence of recurrent disease, hepatic steatosis, slight wall thickening at the junction of the descending and horizontal duodenum Mild elevation of CEA 2023 12/01/2021-Guardant Reveal-ct DNA detected PET 12/30/2021-hypermetabolic right liver lesion, hypermetabolic area in a loop of mid small bowel with an SUV of 12.5, review of PET images at GI tumor conference 01/11/2022-small bowel uptake felt to be a benign finding MRI liver 01/10/2022-solitary 1.2 cm right liver lesion between segments 7 and 8 compatible with metastasis, subtle changes suggesting cirrhosis, hepatic steatosis CT enteroscopy 02/02/2022-small bowel loop with hypermetabolic activity on PET continues to have a bandlike density along the margin felt to represent a diverticulum or adjacent nodal tissue.  Small bowel tumor not excluded.  3-4 mm left omental nodule 02/08/2022-biopsy and ablation of solitary liver lesion, adenocarcinoma consistent with metastatic colorectal adenocarcinoma CTs 03/31/2022-unchanged circumstantial soft tissue thickening surrounding a loop of mid to distal small bowel with an adjacent nodular focus measuring 1.2 cm.,  Ablation cavity in segment 7, no other evidence of metastatic disease Diagnostic laparoscopy 05/26/2022-mid small bowel mass-resected, well to moderately differentiated adenocarcinoma, 0/2 lymph nodes, morphology consistent  with a colon  primary, tumor involved full-thickness including serosa with perineural and large vessel involvement; foundation 1-microsatellite stable, tumor mutation burden 2, K-ras Q61H CT abdomen/pelvis 07/10/2022-the right liver ablation defect is smaller, new subtle right liver lesion measuring 13 x 9 mm, enlarged lymph node in the right upper quadrant mesentery adjacent to surgical anastomosis PET 07/26/2022-new segment 6 liver lesion, enlarging hypermetabolic left omental lesion, there is another mildly hypermetabolic peritoneal implant, 7 mm omental nodule at the midline without hypermetabolism Cycle 1 FOLFOX 09/25/2022 Cycle 2 FOLFOX/bevacizumab  10/09/2022, 5-FU dose reduced secondary to mucositis Cycle 3 FOLFOX/bevacizumab  10/23/2022 Cycle 4 FOLFOX/bevacizumab  11/06/2022 Cycle 5 FOLFOX/bevacizumab  11/20/2022 Cycle 6 FOLFOX/bevacizumab  12/04/2022 CT abdomen/pelvis 12/14/2022-further contraction of the treated segment 7 lesion, slight enlargement of segment 6 lesion, no new liver lesion, increased size of ileocolic nodes and an omental lesion, stable small retroperitoneal nodes Cycle 7 FOLFOX/bevacizumab  12/18/2022 Cycle 8 FOLFOX/bevacizumab  01/02/2023, oxaliplatin  dose reduced secondary to prolonged nausea and neuropathy symptoms, Emend added Cycle 9 FOLFOX/bevacizumab  01/16/2023, oxaliplatin  discontinued secondary to neuropathy Cycle 1 FOLFIRI 02/19/2023, Avastin  held due to recent dental extractions Cycle 2 FOLFIRI/Avastin  03/05/2023 Cycle 3 FOLFIRI/Avastin  03/19/2023, 5-FU bolus held due to mucositis after cycle 2, Fulphila  added Cycle 4 FOLFIRI/Avastin  04/02/2023, Fulphila , Emend Cycle 5 FOLFIRI/Avastin  04/16/2023, Fulphila , Emend CTs 05/02/2023-similar ileocolic mesenteric adenopathy and omental metastasis, cirrhosis with portal hypertension, stable liver lesions, no new or progressive disease Cycle 6 FOLFIRI/Avastin  05/07/2023, Fulphila , Emend Cycle 7 FOLFIRI/Avastin  05/21/2023, Fulphila , Emend Cycle 8  FOLFIRI 06/04/2023, Fulphila , Emend; Avastin  held Cycle 9 FOLFIRI 06/18/2023, Fulphila , Emend, Avastin  held Treatment held 07/09/2023 patient request Cycle 10 FOLFIRI 07/18/2023, Fulphila , Emend, Avastin  held CTs 07/31/2023: Stable with unchanged hepatic lesions, mesenteric lymph nodes, and left omental nodule, unchanged morphologic changes of cirrhosis and portal hypertension Cycle 11 FOLFIRI/Avastin  08/01/2023, Fulphila  Chemotherapy held 08/15/2023 per patient request Cycle 12 FOLFIRI/Avastin  08/30/2023, Udenyca  Cycle 13 FOLFIRI/Avastin  09/19/2023, Udenyca  Cycle 14 FOLFIRI/Avastin  10/01/2023, Udenyca  Cycle 15 FOLFIRI/Avastin  10/22/2023, Udenyca  11/05/2023 treatment held per patient request due to dental issue Cycle 16 FOLFIRI 11/19/2023, Udenyca , Avastin  held secondary to planned dental extractions CTs 11/28/2023: No evidence of progressive disease, stable hepatic, left upper quadrant omental, and ileocolic mesenteric lesions Cycle 17 FOLFIRI/Avastin  12/10/2023 Cycle 18 FOLFIRI 12/31/2023, Avastin  held secondary to 12/27/2023 tooth extraction Cycle 19 FOLFIRI/Avastin  01/22/2024 Cycle 20 FOLFIRI/Avastin  02/12/2024 03/08/2024 CTs: Stable small liver lesions, stable peritoneal nodules, stable left omental soft tissue mass, no evidence of disease progression, cirrhosis/portal hypertension Cycle 21 FOLFIRI/bevacizumab  03/17/2024 Treatment held 04/07/2024 per patient request Cycle 22 FOLFIRI/bevacizumab  04/14/2024  CTs 05/14/2024-no significant interval change of multiple ill-defined hypoattenuating liver lesions and peritoneal carcinomatosis.  No new metastatic disease in abdomen or pelvis. Cycle 23 FOLFIRI/bevacizumab  05/19/2024  Microcytic anemia secondary to #1-improved Colon polyps-sessile serrated adenoma of the descending colon, tubular adenomas with focal high-grade dysplasia of the transverse colon on colonoscopy 02/12/2020, tubular adenoma of the mid ascending colon on the surgical specimen 04/12/2020 Family  history of colon cancer Depression Anxiety/panic attacks Hypertension Hyperlipidemia Nausea secondary to Xeloda ?-Resolved Gastroesophageal reflux disease-improved following discontinuation of Xeloda  Anemia, microcytic; ferritin 6 01/24/2022; stool positive for blood x3 01/25/2022; 02/02/2022 CT abdomen/pelvis enterography-bowel loop demonstrating hypermetabolic activity continues to have a bandlike density along its margin possibly representing a diverticulum or adjacent nodal tissue, difficult to exclude small bowel tumor, 3 x 4 mm left omental nodule or lymph node, known right hepatic lobe tumor is occult on CT. Venofer  weekly x3 beginning 03/03/2022 Negative capsule endoscopy 03/09/2022 Venofer  03/03/2022 for 3 weekly doses Venofer  05/03/2022, 05/19/2022 Venofer  07/28/2022, 08/04/2022 Venofer  03/22/2023,  03/29/2023, 04/04/2023 Venofer  06/18/2023, 07/09/2023 07/03/2023 upper endoscopy and colonoscopy-severe portal hypertensive gastropathy in the gastric fundus, gastric body, stomach, red blood found greater curvature of the stomach and gastric antrum, treated with APC; colonoscopy showed dark old maroon-colored blood in the entire colon.  No evidence of active or recent bleeding in the colon. 12.  Anterior left neck nodule 03/17/2022-cyst?,  Lymph node? 13.  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  Oxaliplatin  neuropathy-mild loss of vibratory sense 12/04/2022, 12/18/2022, 01/02/2023    Disposition: Anthony Calhoun appears stable.  The symptoms he was experiencing last week are better.  He appears well today.  Recent restaging CTs show stable disease.  Results/images reviewed with him at today's visit.  He agrees with the recommendation to continue FOLFIRI/bevacizumab .  Plan to proceed with cycle 23  today as scheduled.  CBC and chemistry panel reviewed.  Labs adequate to proceed with treatment.  Blood sugar remains elevated.  I encouraged him to follow-up with the managing provider.  He completed Venofer  300 mg 05/12/2024.  Plan for 2 additional weekly doses.  He will return for follow-up and the next cycle of FOLFIRI/bevacizumab  in 3 weeks.  We are available to see him sooner if needed.  Patient seen with Dr. Cloretta.   Anthony Calhoun ANP/GNP-BC   05/19/2024  10:09 AM  This was a shared visit with Anthony Calhoun.  We reviewed the restaging CT findings and images with Anthony Calhoun.  There is no radiologic evidence of disease progression.  His clinical status has improved compared to when I saw him last week.  The plan is to continue FOLFIRI/bevacizumab .  He we will complete a course of IV iron  for treatment of iron  deficiency anemia.  I was present for greater than 50% of today's visit.  I performed medical decision making.  Arvella Cloretta, MD

## 2024-05-19 NOTE — Patient Instructions (Signed)
 [facility] - A DEPT OF . [facility]  Discharge Instructions: Thank you for choosing [facility] to provide your oncology and hematology care.   If you have a lab appointment with the Cancer Center, please go directly to the Cancer Center and check in at the registration area.   Wear comfortable clothing and clothing appropriate for easy access to any Portacath or PICC line.   We strive to give you quality time with your provider. You may need to reschedule your appointment if you arrive late (15 or more minutes).  Arriving late affects you and other patients whose appointments are after yours.  Also, if you miss three or more appointments without notifying the office, you may be dismissed from the clinic at the provider's discretion.      For prescription refill requests, have your pharmacy contact our office and allow 72 hours for refills to be completed.    Today you received the following chemotherapy and/or immunotherapy agents: bevacizumab , irinotecan , leucovorin , fluorouracil        To help prevent nausea and vomiting after your treatment, we encourage you to take your nausea medication as directed.  BELOW ARE SYMPTOMS THAT SHOULD BE REPORTED IMMEDIATELY: *FEVER GREATER THAN 100.4 F (38 C) OR HIGHER *CHILLS OR SWEATING *NAUSEA AND VOMITING THAT IS NOT CONTROLLED WITH YOUR NAUSEA MEDICATION *UNUSUAL SHORTNESS OF BREATH *UNUSUAL BRUISING OR BLEEDING *URINARY PROBLEMS (pain or burning when urinating, or frequent urination) *BOWEL PROBLEMS (unusual diarrhea, constipation, pain near the anus) TENDERNESS IN MOUTH AND THROAT WITH OR WITHOUT PRESENCE OF ULCERS (sore throat, sores in mouth, or a toothache) UNUSUAL RASH, SWELLING OR PAIN  UNUSUAL VAGINAL DISCHARGE OR ITCHING   Items with * indicate a potential emergency and should be followed up as soon as possible or go to the Emergency Department if any problems should occur.  Please show the  CHEMOTHERAPY ALERT CARD or IMMUNOTHERAPY ALERT CARD at check-in to the Emergency Department and triage nurse.  Should you have questions after your visit or need to cancel or reschedule your appointment, please contact [facility] - A DEPT OF [facility]  Dept: [phone]  and follow the prompts.  Office hours are 8:00 a.m. to 4:30 p.m. Monday - Friday. Please note that voicemails left after 4:00 p.m. may not be returned until the following business day.  We are closed weekends and major holidays. You have access to a nurse at all times for urgent questions. Please call the main number to the clinic Dept: [phone] and follow the prompts.   For any non-urgent questions, you may also contact your provider using MyChart. We now offer e-Visits for anyone 14 and older to request care online for non-urgent symptoms. For details visit [email or web address].   Also download the MyChart app! Go to the app store, search MyChart, open the app, select [facility], and log in with your MyChart username and password.

## 2024-05-19 NOTE — Progress Notes (Signed)
 Patient seen by [name] NP today  Vitals are within treatment parameters:Yes   Labs are within treatment parameters: Yes   Treatment plan has been signed: Yes   Per physician team, Patient is ready for treatment and there are NO modifications to the treatment plan.Iron  on d/c day

## 2024-05-19 NOTE — Patient Instructions (Signed)

## 2024-05-20 ENCOUNTER — Other Ambulatory Visit: Payer: Self-pay

## 2024-05-20 ENCOUNTER — Encounter: Payer: Self-pay | Admitting: Oncology

## 2024-05-21 ENCOUNTER — Other Ambulatory Visit: Payer: Self-pay | Admitting: Nurse Practitioner

## 2024-05-21 ENCOUNTER — Inpatient Hospital Stay

## 2024-05-21 VITALS — BP 120/72 | HR 68 | Temp 97.9°F | Resp 18

## 2024-05-21 DIAGNOSIS — Z79899 Other long term (current) drug therapy: Secondary | ICD-10-CM | POA: Diagnosis not present

## 2024-05-21 DIAGNOSIS — D509 Iron deficiency anemia, unspecified: Secondary | ICD-10-CM

## 2024-05-21 DIAGNOSIS — C182 Malignant neoplasm of ascending colon: Secondary | ICD-10-CM | POA: Diagnosis not present

## 2024-05-21 DIAGNOSIS — C787 Secondary malignant neoplasm of liver and intrahepatic bile duct: Secondary | ICD-10-CM

## 2024-05-21 DIAGNOSIS — Z5111 Encounter for antineoplastic chemotherapy: Secondary | ICD-10-CM | POA: Diagnosis not present

## 2024-05-21 MED ORDER — IRON SUCROSE 300 MG IVPB - SIMPLE MED
300.0000 mg | Freq: Once | Status: DC
  Filled 2024-05-21: qty 265

## 2024-05-21 MED ORDER — SODIUM CHLORIDE 0.9 % IV SOLN: INTRAVENOUS | Status: DC

## 2024-05-21 MED ORDER — SODIUM CHLORIDE 0.9 % IV SOLN
300.0000 mg | Freq: Once | INTRAVENOUS | Status: AC
  Administered 2024-05-21: 300 mg via INTRAVENOUS
  Filled 2024-05-21: qty 10

## 2024-05-21 MED ORDER — PEGFILGRASTIM-CBQV 6 MG/0.6ML ~~LOC~~ SOSY
6.0000 mg | PREFILLED_SYRINGE | Freq: Once | SUBCUTANEOUS | Status: AC
  Administered 2024-05-21: 6 mg via SUBCUTANEOUS
  Filled 2024-05-21: qty 0.6

## 2024-05-21 NOTE — Patient Instructions (Signed)
 Iron  Sucrose Injection What is this medication? IRON  SUCROSE (EYE ern SOO krose) treats low levels of iron  (iron  deficiency anemia) in people with kidney disease. Iron  is a mineral that plays an important role in making red blood cells, which carry oxygen from your lungs to the rest of your body. This medicine may be used for other purposes; ask your health care provider or pharmacist if you have questions. COMMON BRAND NAME(S): Venofer  What should I tell my care team before I take this medication? They need to know if you have any of these conditions: Anemia not caused by low iron  levels Heart disease High levels of iron  in the blood Kidney disease Liver disease An unusual or allergic reaction to iron , other medications, foods, dyes, or preservatives Pregnant or trying to get pregnant Breastfeeding How should I use this medication? This medication is infused into a vein. It is given by your care team in a hospital or clinic setting. Talk to your care team about the use of this medication in children. While it may be prescribed for children as young as 2 years for selected conditions, precautions do apply. Overdosage: If you think you have taken too much of this medicine contact a poison control center or emergency room at once. NOTE: This medicine is only for you. Do not share this medicine with others. What if I miss a dose? Keep appointments for follow-up doses. It is important not to miss your dose. Call your care team if you are unable to keep an appointment. What may interact with this medication? Do not take this medication with any of the following: Deferoxamine Dimercaprol Other iron  products This medication may also interact with the following: Chloramphenicol Deferasirox This list may not describe all possible interactions. Give your health care provider a list of all the medicines, herbs, non-prescription drugs, or dietary supplements you use. Also tell them if you  smoke, drink alcohol, or use illegal drugs. Some items may interact with your medicine. What should I watch for while using this medication? Visit your care team for regular checks on your progress. Tell your care team if your symptoms do not start to get better or if they get worse. You may need blood work done while you are taking this medication. You may need to eat more foods that contain iron . Talk to your care team. Foods that contain iron  include whole grains or cereals, dried fruits, beans, peas, leafy green vegetables, and organ meats (liver, kidney). What side effects may I notice from receiving this medication? Side effects that you should report to your care team as soon as possible: Allergic reactions--skin rash, itching, hives, swelling of the face, lips, tongue, or throat Low blood pressure--dizziness, feeling faint or lightheaded, blurry vision Shortness of breath Side effects that usually do not require medical attention (report to your care team if they continue or are bothersome): Flushing Headache Joint pain Muscle pain Nausea Pain, redness, or irritation at injection site This list may not describe all possible side effects. Call your doctor for medical advice about side effects. You may report side effects to FDA at 1-800-FDA-1088. Where should I keep my medication? This medication is given in a hospital or clinic. It will not be stored at home. NOTE: This sheet is a summary. It may not cover all possible information. If you have questions about this medicine, talk to your doctor, pharmacist, or health care provider. Pegfilgrastim  Injection What is this medication? PEGFILGRASTIM  (PEG fil gra stim) lowers the  risk of infection in people who are receiving chemotherapy. It works by Systems analyst make more white blood cells, which protects your body from infection. It may also be used to help people who have been exposed to high doses of radiation. This medicine may be  used for other purposes; ask your health care provider or pharmacist if you have questions. COMMON BRAND NAME(S): Fulphila , Fylnetra , Neulasta , Nyvepria , Stimufend , UDENYCA , UDENYCA  ONBODY, Ziextenzo  What should I tell my care team before I take this medication? They need to know if you have any of these conditions: Kidney disease Latex allergy Ongoing radiation therapy Sickle cell disease Skin reactions to acrylic adhesives (On-Body Injector only) An unusual or allergic reaction to pegfilgrastim , filgrastim, other medications, foods, dyes, or preservatives Pregnant or trying to get pregnant Breast-feeding How should I use this medication? This medication is for injection under the skin. If you get this medication at home, you will be taught how to prepare and give the pre-filled syringe or how to use the On-body Injector. Refer to the patient Instructions for Use for detailed instructions. Use exactly as directed. Tell your care team immediately if you suspect that the On-body Injector may not have performed as intended or if you suspect the use of the On-body Injector resulted in a missed or partial dose. It is important that you put your used needles and syringes in a special sharps container. Do not put them in a trash can. If you do not have a sharps container, call your pharmacist or care team to get one. Talk to your care team about the use of this medication in children. While this medication may be prescribed for selected conditions, precautions do apply. Overdosage: If you think you have taken too much of this medicine contact a poison control center or emergency room at once. NOTE: This medicine is only for you. Do not share this medicine with others. What if I miss a dose? It is important not to miss your dose. Call your care team if you miss your dose. If you miss a dose due to an On-body Injector failure or leakage, a new dose should be administered as soon as possible using a single  prefilled syringe for manual use. What may interact with this medication? Interactions have not been studied. This list may not describe all possible interactions. Give your health care provider a list of all the medicines, herbs, non-prescription drugs, or dietary supplements you use. Also tell them if you smoke, drink alcohol, or use illegal drugs. Some items may interact with your medicine. What should I watch for while using this medication? Your condition will be monitored carefully while you are receiving this medication. You may need blood work done while you are taking this medication. Talk to your care team about your risk of cancer. You may be more at risk for certain types of cancer if you take this medication. If you are going to need a MRI, CT scan, or other procedure, tell your care team that you are using this medication (On-Body Injector only). What side effects may I notice from receiving this medication? Side effects that you should report to your care team as soon as possible: Allergic reactions--skin rash, itching, hives, swelling of the face, lips, tongue, or throat Capillary leak syndrome--stomach or muscle pain, unusual weakness or fatigue, feeling faint or lightheaded, decrease in the amount of urine, swelling of the ankles, hands, or feet, trouble breathing High white blood cell level--fever, fatigue, trouble breathing, night  sweats, change in vision, weight loss Inflammation of the aorta--fever, fatigue, back, chest, or stomach pain, severe headache Kidney injury (glomerulonephritis)--decrease in the amount of urine, red or dark brown urine, foamy or bubbly urine, swelling of the ankles, hands, or feet Shortness of breath or trouble breathing Spleen injury--pain in upper left stomach or shoulder Unusual bruising or bleeding Side effects that usually do not require medical attention (report to your care team if they continue or are bothersome): Bone pain Pain in the  hands or feet This list may not describe all possible side effects. Call your doctor for medical advice about side effects. You may report side effects to FDA at 1-800-FDA-1088. Where should I keep my medication? Keep out of the reach of children. If you are using this medication at home, you will be instructed on how to store it. Throw away any unused medication after the expiration date on the label. NOTE: This sheet is a summary. It may not cover all possible information. If you have questions about this medicine, talk to your doctor, pharmacist, or health care provider.  2024 Elsevier/Gold Standard (2021-07-15 00:00:00)    2024 Elsevier/Gold Standard (2023-04-04 00:00:00)

## 2024-05-23 ENCOUNTER — Other Ambulatory Visit: Payer: Self-pay

## 2024-05-23 ENCOUNTER — Inpatient Hospital Stay

## 2024-05-23 ENCOUNTER — Other Ambulatory Visit: Payer: Self-pay | Admitting: Nurse Practitioner

## 2024-05-23 ENCOUNTER — Encounter: Payer: Self-pay | Admitting: Oncology

## 2024-05-23 ENCOUNTER — Telehealth: Payer: Self-pay

## 2024-05-23 ENCOUNTER — Other Ambulatory Visit (HOSPITAL_BASED_OUTPATIENT_CLINIC_OR_DEPARTMENT_OTHER): Payer: Self-pay

## 2024-05-23 DIAGNOSIS — C189 Malignant neoplasm of colon, unspecified: Secondary | ICD-10-CM

## 2024-05-23 DIAGNOSIS — K146 Glossodynia: Secondary | ICD-10-CM

## 2024-05-23 MED ORDER — FLUCONAZOLE 100 MG PO TABS
100.0000 mg | ORAL_TABLET | Freq: Every day | ORAL | 0 refills | Status: DC
  Filled 2024-05-23: qty 4, 4d supply, fill #0

## 2024-05-23 MED ORDER — NYSTATIN 100000 UNIT/ML MT SUSP
5.0000 mL | Freq: Four times a day (QID) | OROMUCOSAL | 0 refills | Status: DC
  Filled 2024-05-23: qty 240, 12d supply, fill #0

## 2024-05-23 NOTE — Telephone Encounter (Signed)
 [facility] CSW Progress Note  Patient cancelled appointment due to thrush.  Patient will contact CSW to reschedule.     Lizbeth Sprague, LCSW Clinical Social Worker [facility]

## 2024-05-23 NOTE — Telephone Encounter (Signed)
 Notified the pt regarding FMLA forms being completed, faxed, and confirmation received. Pt copy was emailed upon request. No questions or concerns to be noted.

## 2024-06-04 ENCOUNTER — Telehealth: Payer: Self-pay | Admitting: Oncology

## 2024-06-04 NOTE — Telephone Encounter (Signed)
 PT called and stated that he needs Monday appts due to this work schedule and they day he can get the pump off.

## 2024-06-07 ENCOUNTER — Other Ambulatory Visit: Payer: Self-pay | Admitting: Oncology

## 2024-06-09 ENCOUNTER — Encounter: Payer: Self-pay | Admitting: Oncology

## 2024-06-09 ENCOUNTER — Other Ambulatory Visit: Payer: Self-pay | Admitting: *Deleted

## 2024-06-09 MED ORDER — FLUCONAZOLE 100 MG PO TABS: 100.0000 mg | ORAL_TABLET | Freq: Every day | ORAL | 0 refills | Status: DC

## 2024-06-10 ENCOUNTER — Inpatient Hospital Stay: Admitting: Nurse Practitioner

## 2024-06-10 ENCOUNTER — Inpatient Hospital Stay

## 2024-06-10 ENCOUNTER — Inpatient Hospital Stay: Attending: Oncology

## 2024-06-12 ENCOUNTER — Other Ambulatory Visit: Payer: Self-pay | Admitting: *Deleted

## 2024-06-12 ENCOUNTER — Other Ambulatory Visit: Payer: Self-pay | Admitting: Nurse Practitioner

## 2024-06-12 ENCOUNTER — Encounter

## 2024-06-12 ENCOUNTER — Other Ambulatory Visit (HOSPITAL_BASED_OUTPATIENT_CLINIC_OR_DEPARTMENT_OTHER): Payer: Self-pay

## 2024-06-12 ENCOUNTER — Encounter: Payer: Self-pay | Admitting: Oncology

## 2024-06-12 MED ORDER — NYSTATIN 100000 UNIT/ML MT SUSP
5.0000 mL | Freq: Four times a day (QID) | OROMUCOSAL | 0 refills | Status: DC
  Filled 2024-06-12 – 2024-06-19 (×2): qty 240, 12d supply, fill #0

## 2024-06-12 MED ORDER — FLUCONAZOLE 100 MG PO TABS: 100.0000 mg | ORAL_TABLET | Freq: Every day | ORAL | 0 refills | Status: DC

## 2024-06-12 NOTE — Progress Notes (Signed)
 Anthony Calhoun reports his thrush has not improved much and he has taken last dose. He is still on antibiotics for his dental issue. Having root canal tomorrow. Per [name], refill fluconazole  for 3 more doses and have patient discuss this with his dentist. His MMW was refilled today as well.

## 2024-06-13 ENCOUNTER — Other Ambulatory Visit (HOSPITAL_BASED_OUTPATIENT_CLINIC_OR_DEPARTMENT_OTHER): Payer: Self-pay

## 2024-06-14 ENCOUNTER — Other Ambulatory Visit (HOSPITAL_BASED_OUTPATIENT_CLINIC_OR_DEPARTMENT_OTHER): Payer: Self-pay

## 2024-06-16 ENCOUNTER — Other Ambulatory Visit: Payer: Self-pay | Admitting: Adult Health

## 2024-06-16 ENCOUNTER — Other Ambulatory Visit (HOSPITAL_BASED_OUTPATIENT_CLINIC_OR_DEPARTMENT_OTHER): Payer: Self-pay

## 2024-06-16 ENCOUNTER — Encounter: Payer: Self-pay | Admitting: Oncology

## 2024-06-16 DIAGNOSIS — F410 Panic disorder [episodic paroxysmal anxiety] without agoraphobia: Secondary | ICD-10-CM

## 2024-06-16 DIAGNOSIS — F411 Generalized anxiety disorder: Secondary | ICD-10-CM

## 2024-06-17 ENCOUNTER — Other Ambulatory Visit: Payer: Self-pay

## 2024-06-18 DIAGNOSIS — E119 Type 2 diabetes mellitus without complications: Secondary | ICD-10-CM | POA: Diagnosis not present

## 2024-06-18 DIAGNOSIS — H5213 Myopia, bilateral: Secondary | ICD-10-CM | POA: Diagnosis not present

## 2024-06-18 DIAGNOSIS — H2513 Age-related nuclear cataract, bilateral: Secondary | ICD-10-CM | POA: Diagnosis not present

## 2024-06-19 ENCOUNTER — Other Ambulatory Visit (HOSPITAL_COMMUNITY): Payer: Self-pay

## 2024-06-19 ENCOUNTER — Inpatient Hospital Stay

## 2024-06-19 ENCOUNTER — Other Ambulatory Visit (HOSPITAL_BASED_OUTPATIENT_CLINIC_OR_DEPARTMENT_OTHER): Payer: Self-pay

## 2024-06-19 ENCOUNTER — Encounter: Payer: Self-pay | Admitting: Nurse Practitioner

## 2024-06-19 ENCOUNTER — Inpatient Hospital Stay: Attending: Oncology

## 2024-06-19 ENCOUNTER — Inpatient Hospital Stay: Admitting: Nurse Practitioner

## 2024-06-19 VITALS — BP 122/67 | HR 76 | Resp 18

## 2024-06-19 VITALS — BP 127/75 | HR 84 | Temp 98.1°F | Resp 18 | Ht 66.0 in | Wt 245.8 lb

## 2024-06-19 DIAGNOSIS — C787 Secondary malignant neoplasm of liver and intrahepatic bile duct: Secondary | ICD-10-CM | POA: Diagnosis not present

## 2024-06-19 DIAGNOSIS — C189 Malignant neoplasm of colon, unspecified: Secondary | ICD-10-CM

## 2024-06-19 DIAGNOSIS — Z5111 Encounter for antineoplastic chemotherapy: Secondary | ICD-10-CM | POA: Insufficient documentation

## 2024-06-19 DIAGNOSIS — D509 Iron deficiency anemia, unspecified: Secondary | ICD-10-CM | POA: Diagnosis not present

## 2024-06-19 DIAGNOSIS — Z79899 Other long term (current) drug therapy: Secondary | ICD-10-CM | POA: Diagnosis not present

## 2024-06-19 DIAGNOSIS — C182 Malignant neoplasm of ascending colon: Secondary | ICD-10-CM | POA: Insufficient documentation

## 2024-06-19 LAB — CMP (CANCER CENTER ONLY)
ALT: 40 U/L (ref 0–44)
AST: 36 U/L (ref 15–41)
Albumin: 4.1 g/dL (ref 3.5–5.0)
Alkaline Phosphatase: 193 U/L — ABNORMAL HIGH (ref 38–126)
Anion gap: 11 (ref 5–15)
BUN: 9 mg/dL (ref 6–20)
CO2: 26 mmol/L (ref 22–32)
Calcium: 9.6 mg/dL (ref 8.9–10.3)
Chloride: 98 mmol/L (ref 98–111)
Creatinine: 0.8 mg/dL (ref 0.61–1.24)
GFR, Estimated: >60 mL/min (ref >60)
Glucose, Bld: 257 mg/dL — ABNORMAL HIGH (ref 70–99)
Potassium: 4.2 mmol/L (ref 3.5–5.1)
Sodium: 135 mmol/L (ref 135–145)
Total Bilirubin: 0.9 mg/dL (ref 0.0–1.2)
Total Protein: 7.9 g/dL (ref 6.5–8.1)

## 2024-06-19 LAB — CBC WITH DIFFERENTIAL (CANCER CENTER ONLY)
Abs Immature Granulocytes: 0.02 K/uL (ref 0.00–0.07)
Basophils Absolute: 0.1 K/uL (ref 0.0–0.1)
Basophils Relative: 2 %
Eosinophils Absolute: 0.1 K/uL (ref 0.0–0.5)
Eosinophils Relative: 2 %
HCT: 37.6 % — ABNORMAL LOW (ref 39.0–52.0)
Hemoglobin: 11.1 g/dL — ABNORMAL LOW (ref 13.0–17.0)
Immature Granulocytes: 0 %
Lymphocytes Relative: 19 %
Lymphs Abs: 1 K/uL (ref 0.7–4.0)
MCH: 22.2 pg — ABNORMAL LOW (ref 26.0–34.0)
MCHC: 29.5 g/dL — ABNORMAL LOW (ref 30.0–36.0)
MCV: 75.4 fL — ABNORMAL LOW (ref 80.0–100.0)
Monocytes Absolute: 0.3 K/uL (ref 0.1–1.0)
Monocytes Relative: 7 %
Neutro Abs: 3.6 K/uL (ref 1.7–7.7)
Neutrophils Relative %: 70 %
Platelet Count: 195 K/uL (ref 150–400)
RBC: 4.99 MIL/uL (ref 4.22–5.81)
RDW: 20.1 % — ABNORMAL HIGH (ref 11.5–15.5)
WBC Count: 5.1 K/uL (ref 4.0–10.5)
nRBC: 0 % (ref 0.0–0.2)

## 2024-06-19 LAB — CEA (ACCESS): CEA (CHCC): 107.01 ng/mL — ABNORMAL HIGH (ref 0.00–5.00)

## 2024-06-19 LAB — TOTAL PROTEIN, URINE DIPSTICK

## 2024-06-19 MED ORDER — FLUOROURACIL CHEMO INJECTION 500 MG/10ML
200.0000 mg/m2 | Freq: Once | INTRAVENOUS | Status: AC
  Administered 2024-06-19: 450 mg via INTRAVENOUS
  Filled 2024-06-19: qty 9

## 2024-06-19 MED ORDER — SODIUM CHLORIDE 0.9 % IV SOLN
200.0000 mg/m2 | Freq: Once | INTRAVENOUS | Status: AC
  Administered 2024-06-19: 444 mg via INTRAVENOUS
  Filled 2024-06-19: qty 22.2

## 2024-06-19 MED ORDER — SODIUM CHLORIDE 0.9 % IV SOLN
1600.0000 mg/m2 | INTRAVENOUS | Status: DC
  Administered 2024-06-19: 3500 mg via INTRAVENOUS
  Filled 2024-06-19: qty 70

## 2024-06-19 MED ORDER — PALONOSETRON HCL INJECTION 0.25 MG/5ML
0.2500 mg | Freq: Once | INTRAVENOUS | Status: AC
  Administered 2024-06-19: 0.25 mg via INTRAVENOUS
  Filled 2024-06-19: qty 5

## 2024-06-19 MED ORDER — SCOPOLAMINE 1 MG/3DAYS TD PT72
1.0000 | MEDICATED_PATCH | TRANSDERMAL | 0 refills | Status: DC
  Filled 2024-06-19: qty 10, 30d supply, fill #0

## 2024-06-19 MED ORDER — SODIUM CHLORIDE 0.9 % IV SOLN
5.0000 mg/kg | Freq: Once | INTRAVENOUS | Status: AC
  Administered 2024-06-19: 600 mg via INTRAVENOUS
  Filled 2024-06-19: qty 16

## 2024-06-19 MED ORDER — DEXAMETHASONE SODIUM PHOSPHATE 10 MG/ML IJ SOLN
10.0000 mg | Freq: Once | INTRAMUSCULAR | Status: AC
  Administered 2024-06-19: 10 mg via INTRAVENOUS

## 2024-06-19 MED ORDER — SODIUM CHLORIDE 0.9 % IV SOLN
135.0000 mg/m2 | Freq: Once | INTRAVENOUS | Status: AC
  Administered 2024-06-19: 300 mg via INTRAVENOUS
  Filled 2024-06-19: qty 15

## 2024-06-19 MED ORDER — ATROPINE SULFATE 1 MG/ML IV SOLN
0.5000 mg | Freq: Once | INTRAVENOUS | Status: AC | PRN
  Administered 2024-06-19: 0.5 mg via INTRAVENOUS
  Filled 2024-06-19: qty 1

## 2024-06-19 MED ORDER — DEXAMETHASONE SODIUM PHOSPHATE 10 MG/ML IJ SOLN
10.0000 mg | Freq: Once | INTRAMUSCULAR | Status: DC
  Filled 2024-06-19: qty 1

## 2024-06-19 MED ORDER — SODIUM CHLORIDE 0.9 % IV SOLN: Freq: Once | INTRAVENOUS | Status: AC

## 2024-06-19 MED ORDER — SODIUM CHLORIDE 0.9 % IV SOLN
150.0000 mg | Freq: Once | INTRAVENOUS | Status: AC
  Administered 2024-06-19: 150 mg via INTRAVENOUS
  Filled 2024-06-19: qty 150

## 2024-06-19 NOTE — Patient Instructions (Signed)
 [facility] - A DEPT OF . [facility]  Discharge Instructions: Thank you for choosing [facility] to provide your oncology and hematology care.   If you have a lab appointment with the Cancer Center, please go directly to the Cancer Center and check in at the registration area.   Wear comfortable clothing and clothing appropriate for easy access to any Portacath or PICC line.   We strive to give you quality time with your provider. You may need to reschedule your appointment if you arrive late (15 or more minutes).  Arriving late affects you and other patients whose appointments are after yours.  Also, if you miss three or more appointments without notifying the office, you may be dismissed from the clinic at the provider's discretion.      For prescription refill requests, have your pharmacy contact our office and allow 72 hours for refills to be completed.    Today you received the following chemotherapy and/or immunotherapy agents: bevacizumab , irinotecan , leucovorin , fluorouracil        To help prevent nausea and vomiting after your treatment, we encourage you to take your nausea medication as directed.  BELOW ARE SYMPTOMS THAT SHOULD BE REPORTED IMMEDIATELY: *FEVER GREATER THAN 100.4 F (38 C) OR HIGHER *CHILLS OR SWEATING *NAUSEA AND VOMITING THAT IS NOT CONTROLLED WITH YOUR NAUSEA MEDICATION *UNUSUAL SHORTNESS OF BREATH *UNUSUAL BRUISING OR BLEEDING *URINARY PROBLEMS (pain or burning when urinating, or frequent urination) *BOWEL PROBLEMS (unusual diarrhea, constipation, pain near the anus) TENDERNESS IN MOUTH AND THROAT WITH OR WITHOUT PRESENCE OF ULCERS (sore throat, sores in mouth, or a toothache) UNUSUAL RASH, SWELLING OR PAIN  UNUSUAL VAGINAL DISCHARGE OR ITCHING   Items with * indicate a potential emergency and should be followed up as soon as possible or go to the Emergency Department if any problems should occur.  Please show the  CHEMOTHERAPY ALERT CARD or IMMUNOTHERAPY ALERT CARD at check-in to the Emergency Department and triage nurse.  Should you have questions after your visit or need to cancel or reschedule your appointment, please contact [facility] - A DEPT OF [facility]  Dept: [phone]  and follow the prompts.  Office hours are 8:00 a.m. to 4:30 p.m. Monday - Friday. Please note that voicemails left after 4:00 p.m. may not be returned until the following business day.  We are closed weekends and major holidays. You have access to a nurse at all times for urgent questions. Please call the main number to the clinic Dept: [phone] and follow the prompts.   For any non-urgent questions, you may also contact your provider using MyChart. We now offer e-Visits for anyone 14 and older to request care online for non-urgent symptoms. For details visit [email or web address].   Also download the MyChart app! Go to the app store, search MyChart, open the app, select [facility], and log in with your MyChart username and password.

## 2024-06-19 NOTE — Addendum Note (Signed)
 Addended by: Jalexia Lalli K on: 06/19/2024 10:11 AM   Modules accepted: Orders

## 2024-06-19 NOTE — Progress Notes (Signed)
 Patient seen by Lacie Burton, NP today  Vitals are within treatment parameters:Yes   Labs are within treatment parameters: Yes   Treatment plan has been signed: Yes   Per physician team, Patient is ready for treatment and there are NO modifications to the treatment plan.

## 2024-06-19 NOTE — Progress Notes (Signed)
 Anthony Calhoun   [phone] [phone]    Patient Care Team: Seabron Lenis, MD as PCP - General (Family Medicine) Cloretta Arley NOVAK, MD as Consulting Physician (Oncology)   CHIEF COMPLAINT: Follow up colon cancer   CURRENT THERAPY: FOLFIRI/Beva  INTERVAL HISTORY Anthony Calhoun returns for follow up and treatment. Last seen 9/22 with another cycle. For the past 3-4 weeks he's been dealing with thrush and an abscessed tooth, underwent root canal last week and has recovered. Able to eat/drink better. He is fatigued but mobile at home. Stays nauseated, phenergan  helps, no vomiting. Has minor diarrhea after chemo but stools are normal lately. Abdomen continues to bulge at times but is not bloated today. Has a sporadic dry cough.   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     Past Surgical History:  Procedure Laterality Date   APPENDECTOMY     BOWEL RESECTION N/A 05/26/2022   Procedure: SMALL BOWEL RESECTION;  Surgeon: Debby Hila, MD;  Location: [facility];  Service: General;  Laterality: N/A;   COLON SURGERY     COLONOSCOPY     COLONOSCOPY WITH PROPOFOL  N/A 07/03/2023   Procedure: COLONOSCOPY WITH PROPOFOL ;  Surgeon: Saintclair Jasper, MD;  Location: [facility];  Service: Gastroenterology;  Laterality: N/A;   CYSTOSCOPY W/ URETEROSCOPY W/ LITHOTRIPSY  1981   ESOPHAGOGASTRODUODENOSCOPY (EGD) WITH PROPOFOL  N/A 07/03/2023   Procedure: ESOPHAGOGASTRODUODENOSCOPY (EGD) WITH PROPOFOL ;  Surgeon: Saintclair Jasper, MD;  Location: [facility];  Service: Gastroenterology;   Laterality: N/A;  with banding   HOT HEMOSTASIS N/A 07/03/2023   Procedure: HOT HEMOSTASIS (ARGON PLASMA COAGULATION/BICAP);  Surgeon: Saintclair Jasper, MD;  Location: [facility];  Service: Gastroenterology;  Laterality: N/A;   IR IMAGING GUIDED PORT INSERTION  09/21/2022   IR RADIOLOGIST EVAL & MGMT  01/17/2022   IR RADIOLOGIST EVAL & MGMT  03/07/2022   IR RADIOLOGIST EVAL & MGMT  07/13/2022   IR RADIOLOGIST EVAL & MGMT  09/07/2022   LAPAROSCOPY N/A 05/26/2022   Procedure: LAPAROSCOPY DIAGNOSTIC;  Surgeon: Debby Hila, MD;  Location: [facility];  Service: General;  Laterality: N/A;   RADIOLOGY WITH ANESTHESIA N/A 02/08/2022   Procedure: CT MICROWAVE ABLATION;  Surgeon: Karalee Wilkie POUR, MD;  Location: [facility];  Service: Radiology;  Laterality: N/A;     Outpatient Encounter Medications as of 06/19/2024  Medication Sig   Accu-Chek Softclix Lancets lancets SMARTSIG:Topical   acetaminophen  (TYLENOL ) 500 MG tablet Take 500 mg by mouth every 6 (six) hours as needed.   ALPRAZolam  (XANAX ) 1 MG tablet TAKE 1 TABLET BY MOUTH THREE TIMES A DAY AS NEEDED FOR ANXIETY   amLODipine  (NORVASC ) 5 MG tablet Take 5 mg by mouth daily.   carvedilol  (COREG ) 6.25 MG tablet Take 6.25 mg by mouth 2 (two) times daily with a meal.   Continuous Glucose Sensor (FREESTYLE LIBRE 3 PLUS SENSOR) MISC Use one sensor every 15 days to monitor blood sugars continuously for 30 days  fluconazole  (DIFLUCAN ) 100 MG tablet Take 1 tablet (100 mg total) by mouth daily. X 3 more doses   HUMALOG KWIKPEN 100 UNIT/ML KwikPen Inject into the skin 3 (three) times daily. Sliding scale   hydrocortisone (ANUSOL-HC) 25 MG suppository Place 25 mg rectally daily.   LANTUS SOLOSTAR 100 UNIT/ML Solostar Pen Inject 45 Units into the skin daily. Taper up daily   magic mouthwash (nystatin , diphenhydrAMINE , alum & mag hydroxide) suspension mixture Take 5 mLs by mouth 4 (four) times daily.   pantoprazole  (PROTONIX ) 40 MG tablet TAKE 1 TABLET BY MOUTH EVERY  DAY   promethazine  (PHENERGAN ) 25 MG tablet Take 1 tablet (25 mg total) by mouth every 6 (six) hours as needed.   sertraline  (ZOLOFT ) 100 MG tablet Take 1 tablet (100 mg total) by mouth daily.   triamcinolone  (KENALOG ) 0.1 % paste Use as directed in the mouth or throat 2 (two) times daily as needed.   Bempedoic Acid (NEXLETOL) 180 MG TABS Take 180 mg by mouth daily. (Patient not taking: Reported on 06/19/2024)   tiZANidine (ZANAFLEX) 2 MG tablet Take 2 mg by mouth every 12 (twelve) hours. (Patient not taking: Reported on 06/19/2024)   Facility-Administered Encounter Medications as of 06/19/2024  Medication   sodium chloride  flush (NS) 0.9 % injection 10 mL     Today's Vitals   06/19/24 0800 06/19/24 0842  BP:  127/75  Pulse:  84  Resp:  18  Temp:  98.1 F (36.7 C)  TempSrc:  Temporal  SpO2:  98%  Weight:  245 lb 12.8 oz (111.5 kg)  Height:  5' 6 (1.676 m)  PainSc: 0-No pain    Body mass index is 39.67 kg/m.   ECOG PERFORMANCE STATUS: 1 - Symptomatic but completely ambulatory  PHYSICAL EXAM GENERAL:alert, no distress and comfortable SKIN: no rash  HEENT: sclera clear. No thrush or ulcers. No gingival erythema or abscess  NECK: without mass LUNGS: clear with normal breathing effort HEART: regular rate & rhythm, no lower extremity edema ABDOMEN: abdomen soft, non-tender and normal bowel sounds NEURO: alert & oriented x 3 with fluent speech, no focal motor/sensory deficits PAC without erythema    CBC    Latest Ref Rng & Units 06/19/2024    8:36 AM 05/19/2024    8:52 AM 05/12/2024    8:13 AM  CBC  WBC 4.0 - 10.5 K/uL 5.1  4.2  5.6   Hemoglobin 13.0 - 17.0 g/dL 88.8  9.8  9.6   Hematocrit 39.0 - 52.0 % 37.6  34.0  32.0   Platelets 150 - 400 K/uL 195  144  197       CMP     Latest Ref Rng & Units 06/19/2024    8:36 AM 05/19/2024    8:52 AM 05/12/2024    8:13 AM  CMP  Glucose 70 - 99 mg/dL 742  643  654   BUN 6 - 20 mg/dL 9  11  12    Creatinine 0.61 - 1.24 mg/dL  9.19  9.17  9.12   Sodium 135 - 145 mmol/L 135  132  134   Potassium 3.5 - 5.1 mmol/L 4.2  4.0  3.9   Chloride 98 - 111 mmol/L 98  98  99   CO2 22 - 32 mmol/L 26  23  22    Calcium  8.9 - 10.3 mg/dL 9.6  9.1  9.0   Total Protein 6.5 - 8.1 g/dL 7.9  7.4  7.3   Total Bilirubin 0.0 - 1.2 mg/dL  0.9  0.8  0.8   Alkaline Phos 38 - 126 U/L 193  177  163   AST 15 - 41 U/L 36  35  32   ALT 0 - 44 U/L 40  32  26       ASSESSMENT & PLAN: Moderately differentiated adenocarcinoma of the ascending colon, stage IIb (T4aN0), status post a right colectomy 04/12/2020 Tumor invades the visceral peritoneum, 0/19 lymph nodes, no lymphovascular or perineural invasion, no tumor deposits, MSS, no loss of mismatch repair protein expression Cycle 1 adjuvant Xeloda  05/17/2020 Cycle 2 adjuvant Xeloda  06/07/2020, Xeloda  discontinued after 12 days secondary to nausea and rectal bleeding Cycle 3 adjuvant Xeloda  06/28/2020, dose reduced to 2000 mg a.m., 1500 mg p.m. secondary to nausea (patient discontinued Xeloda  on day 11 due to colonoscopy) Colonoscopy 07/09/2020-nodular mucosa at the colonic anastomosis, biopsied; patent end-to-side ileocolonic anastomosis characterized by healthy-appearing mucosa and visible sutures; nonbleeding internal hemorrhoids, biopsy with benign ulcerated anastomotic mucosa with granulation tissue Cycle 4 adjuvant Xeloda  07/19/2020, 2000 mg every morning and 1500 mg every afternoon Cycle 5 adjuvant Xeloda  08/09/2020 Cycle 6 adjuvant Xeloda  08/31/2019 Cycle 7 adjuvant Xeloda  09/20/2020 Cycle 8 adjuvant Xeloda  10/11/2020 CTs 03/07/2021-no evidence of recurrent disease, hepatic steatosis, slight wall thickening at the junction of the descending and horizontal duodenum Mild elevation of CEA 2023 12/01/2021-Guardant Reveal-ct DNA detected PET 12/30/2021-hypermetabolic right liver lesion, hypermetabolic area in a loop of mid small bowel with an SUV of 12.5, review of PET images at GI tumor conference  01/11/2022-small bowel uptake felt to be a benign finding MRI liver 01/10/2022-solitary 1.2 cm right liver lesion between segments 7 and 8 compatible with metastasis, subtle changes suggesting cirrhosis, hepatic steatosis CT enteroscopy 02/02/2022-small bowel loop with hypermetabolic activity on PET continues to have a bandlike density along the margin felt to represent a diverticulum or adjacent nodal tissue.  Small bowel tumor not excluded.  3-4 mm left omental nodule 02/08/2022-biopsy and ablation of solitary liver lesion, adenocarcinoma consistent with metastatic colorectal adenocarcinoma CTs 03/31/2022-unchanged circumstantial soft tissue thickening surrounding a loop of mid to distal small bowel with an adjacent nodular focus measuring 1.2 cm.,  Ablation cavity in segment 7, no other evidence of metastatic disease Diagnostic laparoscopy 05/26/2022-mid small bowel mass-resected, well to moderately differentiated adenocarcinoma, 0/2 lymph nodes, morphology consistent with a colon primary, tumor involved full-thickness including serosa with perineural and large vessel involvement; foundation 1-microsatellite stable, tumor mutation burden 2, K-ras Q61H CT abdomen/pelvis 07/10/2022-the right liver ablation defect is smaller, new subtle right liver lesion measuring 13 x 9 mm, enlarged lymph node in the right upper quadrant mesentery adjacent to surgical anastomosis PET 07/26/2022-new segment 6 liver lesion, enlarging hypermetabolic left omental lesion, there is another mildly hypermetabolic peritoneal implant, 7 mm omental nodule at the midline without hypermetabolism Cycle 1 FOLFOX 09/25/2022 Cycle 2 FOLFOX/bevacizumab  10/09/2022, 5-FU dose reduced secondary to mucositis Cycle 3 FOLFOX/bevacizumab  10/23/2022 Cycle 4 FOLFOX/bevacizumab  11/06/2022 Cycle 5 FOLFOX/bevacizumab  11/20/2022 Cycle 6 FOLFOX/bevacizumab  12/04/2022 CT abdomen/pelvis 12/14/2022-further contraction of the treated segment 7 lesion, slight  enlargement of segment 6 lesion, no new liver lesion, increased size of ileocolic nodes and an omental lesion, stable small retroperitoneal nodes Cycle 7 FOLFOX/bevacizumab  12/18/2022 Cycle 8 FOLFOX/bevacizumab  01/02/2023, oxaliplatin  dose reduced secondary to prolonged nausea and neuropathy symptoms, Emend added Cycle 9 FOLFOX/bevacizumab  01/16/2023, oxaliplatin  discontinued secondary to neuropathy Cycle 1 FOLFIRI 02/19/2023, Avastin  held due to recent dental extractions Cycle 2 FOLFIRI/Avastin  03/05/2023 Cycle 3 FOLFIRI/Avastin  03/19/2023, 5-FU bolus held due to mucositis after cycle 2, Fulphila   added Cycle 4 FOLFIRI/Avastin  04/02/2023, Fulphila , Emend Cycle 5 FOLFIRI/Avastin  04/16/2023, Fulphila , Emend CTs 05/02/2023-similar ileocolic mesenteric adenopathy and omental metastasis, cirrhosis with portal hypertension, stable liver lesions, no new or progressive disease Cycle 6 FOLFIRI/Avastin  05/07/2023, Fulphila , Emend Cycle 7 FOLFIRI/Avastin  05/21/2023, Fulphila , Emend Cycle 8 FOLFIRI 06/04/2023, Fulphila , Emend; Avastin  held Cycle 9 FOLFIRI 06/18/2023, Fulphila , Emend, Avastin  held Treatment held 07/09/2023 patient request Cycle 10 FOLFIRI 07/18/2023, Fulphila , Emend, Avastin  held CTs 07/31/2023: Stable with unchanged hepatic lesions, mesenteric lymph nodes, and left omental nodule, unchanged morphologic changes of cirrhosis and portal hypertension Cycle 11 FOLFIRI/Avastin  08/01/2023, Fulphila  Chemotherapy held 08/15/2023 per patient request Cycle 12 FOLFIRI/Avastin  08/30/2023, Udenyca  Cycle 13 FOLFIRI/Avastin  09/19/2023, Udenyca  Cycle 14 FOLFIRI/Avastin  10/01/2023, Udenyca  Cycle 15 FOLFIRI/Avastin  10/22/2023, Udenyca  11/05/2023 treatment held per patient request due to dental issue Cycle 16 FOLFIRI 11/19/2023, Udenyca , Avastin  held secondary to planned dental extractions CTs 11/28/2023: No evidence of progressive disease, stable hepatic, left upper quadrant omental, and ileocolic mesenteric lesions Cycle 17  FOLFIRI/Avastin  12/10/2023 Cycle 18 FOLFIRI 12/31/2023, Avastin  held secondary to 12/27/2023 tooth extraction Cycle 19 FOLFIRI/Avastin  01/22/2024 Cycle 20 FOLFIRI/Avastin  02/12/2024 03/08/2024 CTs: Stable small liver lesions, stable peritoneal nodules, stable left omental soft tissue mass, no evidence of disease progression, cirrhosis/portal hypertension Cycle 21 FOLFIRI/bevacizumab  03/17/2024 Treatment held 04/07/2024 per patient request Cycle 22 FOLFIRI/bevacizumab  04/14/2024  CTs 05/14/2024-no significant interval change of multiple ill-defined hypoattenuating liver lesions and peritoneal carcinomatosis.  No new metastatic disease in abdomen or pelvis. Cycle 23 FOLFIRI/bevacizumab  05/19/2024  Cycle 24 FOLFIRI/bevacizumab  06/19/2024 Microcytic anemia secondary to #1-improved Colon polyps-sessile serrated adenoma of the descending colon, tubular adenomas with focal high-grade dysplasia of the transverse colon on colonoscopy 02/12/2020, tubular adenoma of the mid ascending colon on the surgical specimen 04/12/2020 Family history of colon cancer Depression Anxiety/panic attacks Hypertension Hyperlipidemia Nausea secondary to Xeloda ?-Resolved Gastroesophageal reflux disease-improved following discontinuation of Xeloda  Anemia, microcytic; ferritin 6 01/24/2022; stool positive for blood x3 01/25/2022; 02/02/2022 CT abdomen/pelvis enterography-bowel loop demonstrating hypermetabolic activity continues to have a bandlike density along its margin possibly representing a diverticulum or adjacent nodal tissue, difficult to exclude small bowel tumor, 3 x 4 mm left omental nodule or lymph node, known right hepatic lobe tumor is occult on CT. Venofer  weekly x3 beginning 03/03/2022 Negative capsule endoscopy 03/09/2022 Venofer  03/03/2022 for 3 weekly doses Venofer  05/03/2022, 05/19/2022 Venofer  07/28/2022, 08/04/2022 Venofer  03/22/2023, 03/29/2023, 04/04/2023 Venofer  06/18/2023, 07/09/2023 07/03/2023 upper endoscopy and  colonoscopy-severe portal hypertensive gastropathy in the gastric fundus, gastric body, stomach, red blood found greater curvature of the stomach and gastric antrum, treated with APC; colonoscopy showed dark old maroon-colored blood in the entire colon.  No evidence of active or recent bleeding in the colon. 12.  Anterior left neck nodule 03/17/2022-cyst?,  Lymph node? 13.  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  Oxaliplatin  neuropathy-mild loss of vibratory sense 12/04/2022, 12/18/2022, 01/02/2023   Disposition:  Anthony Calhoun appears stable. He continues FOLFIRI/Beva, tolerating well with stable fatigue, nausea, and diarrhea. The nausea is partially controlled with phenergan . I recommend to try scopolamine, potential SEs reviewed, he would like to try. Otherwise he is able to recover and function well at home with adequate PS. There is no clinical evidence of disease progression.   CEA slightly increased since 04/2024 but stable and CT showed no progression. Clinically he is doing well.  Labs reviewed, adequate for treatment. He has recovered from recent dental infection/root canal. Proceed with FOLFIRI/Beva today, no dose adjustments. GCSF on 10/25.   Follow up and next cycle in 3 weeks, or sooner if needed. The plan was reviewed with Dr. Cloretta.      All questions were answered. The patient knows to call the clinic with any problems, questions or concerns. No barriers to learning were detected.  Anthony Calhoun K Tynan Boesel, NP 06/19/2024

## 2024-06-21 ENCOUNTER — Inpatient Hospital Stay

## 2024-06-21 VITALS — BP 130/72 | HR 69 | Temp 97.3°F | Resp 20

## 2024-06-21 DIAGNOSIS — C787 Secondary malignant neoplasm of liver and intrahepatic bile duct: Secondary | ICD-10-CM

## 2024-06-21 DIAGNOSIS — Z5111 Encounter for antineoplastic chemotherapy: Secondary | ICD-10-CM | POA: Diagnosis not present

## 2024-06-21 MED ORDER — PEGFILGRASTIM-CBQV 6 MG/0.6ML ~~LOC~~ SOSY
6.0000 mg | PREFILLED_SYRINGE | Freq: Once | SUBCUTANEOUS | Status: AC
  Administered 2024-06-21: 6 mg via SUBCUTANEOUS
  Filled 2024-06-21: qty 0.6

## 2024-06-24 ENCOUNTER — Other Ambulatory Visit (HOSPITAL_BASED_OUTPATIENT_CLINIC_OR_DEPARTMENT_OTHER): Payer: Self-pay

## 2024-06-24 ENCOUNTER — Other Ambulatory Visit: Payer: Self-pay | Admitting: Oncology

## 2024-06-24 MED ORDER — NYSTATIN 100000 UNIT/ML MT SUSP
Freq: Two times a day (BID) | OROMUCOSAL | 1 refills | Status: DC
  Filled 2024-06-24 – 2024-07-09 (×2): qty 240, 12d supply, fill #0
  Filled 2024-07-23: qty 240, 12d supply, fill #1

## 2024-06-24 MED ORDER — NYSTATIN 100000 UNIT/ML MT SUSP: 5.0000 mL | Freq: Four times a day (QID) | OROMUCOSAL | 1 refills | Status: DC | PRN

## 2024-06-25 ENCOUNTER — Encounter: Payer: Self-pay | Admitting: Oncology

## 2024-06-26 ENCOUNTER — Other Ambulatory Visit: Payer: Self-pay | Admitting: *Deleted

## 2024-06-26 ENCOUNTER — Other Ambulatory Visit (HOSPITAL_BASED_OUTPATIENT_CLINIC_OR_DEPARTMENT_OTHER): Payer: Self-pay

## 2024-06-30 ENCOUNTER — Other Ambulatory Visit

## 2024-06-30 ENCOUNTER — Ambulatory Visit: Admitting: Nurse Practitioner

## 2024-06-30 ENCOUNTER — Ambulatory Visit

## 2024-07-01 ENCOUNTER — Other Ambulatory Visit

## 2024-07-01 ENCOUNTER — Ambulatory Visit

## 2024-07-01 ENCOUNTER — Ambulatory Visit: Admitting: Oncology

## 2024-07-02 ENCOUNTER — Encounter

## 2024-07-02 ENCOUNTER — Other Ambulatory Visit: Payer: Self-pay | Admitting: Oncology

## 2024-07-03 ENCOUNTER — Encounter

## 2024-07-07 ENCOUNTER — Encounter: Payer: Self-pay | Admitting: Nurse Practitioner

## 2024-07-07 ENCOUNTER — Inpatient Hospital Stay

## 2024-07-07 ENCOUNTER — Inpatient Hospital Stay (HOSPITAL_BASED_OUTPATIENT_CLINIC_OR_DEPARTMENT_OTHER): Admitting: Nurse Practitioner

## 2024-07-07 ENCOUNTER — Inpatient Hospital Stay: Attending: Oncology

## 2024-07-07 VITALS — BP 128/73 | HR 86 | Temp 98.1°F | Resp 18 | Ht 66.0 in | Wt 250.3 lb

## 2024-07-07 VITALS — BP 130/69 | HR 79 | Temp 98.2°F | Resp 18

## 2024-07-07 DIAGNOSIS — C189 Malignant neoplasm of colon, unspecified: Secondary | ICD-10-CM

## 2024-07-07 DIAGNOSIS — C787 Secondary malignant neoplasm of liver and intrahepatic bile duct: Secondary | ICD-10-CM | POA: Diagnosis not present

## 2024-07-07 DIAGNOSIS — Z79899 Other long term (current) drug therapy: Secondary | ICD-10-CM | POA: Insufficient documentation

## 2024-07-07 DIAGNOSIS — Z5111 Encounter for antineoplastic chemotherapy: Secondary | ICD-10-CM | POA: Insufficient documentation

## 2024-07-07 DIAGNOSIS — C182 Malignant neoplasm of ascending colon: Secondary | ICD-10-CM | POA: Insufficient documentation

## 2024-07-07 DIAGNOSIS — D509 Iron deficiency anemia, unspecified: Secondary | ICD-10-CM | POA: Insufficient documentation

## 2024-07-07 LAB — CMP (CANCER CENTER ONLY)
ALT: 41 U/L (ref 0–44)
AST: 38 U/L (ref 15–41)
Albumin: 4 g/dL (ref 3.5–5.0)
Alkaline Phosphatase: 188 U/L — ABNORMAL HIGH (ref 38–126)
Anion gap: 9 (ref 5–15)
BUN: 8 mg/dL (ref 6–20)
CO2: 26 mmol/L (ref 22–32)
Calcium: 8.7 mg/dL — ABNORMAL LOW (ref 8.9–10.3)
Chloride: 98 mmol/L (ref 98–111)
Creatinine: 0.78 mg/dL (ref 0.61–1.24)
GFR, Estimated: >60 mL/min (ref >60)
Glucose, Bld: 291 mg/dL — ABNORMAL HIGH (ref 70–99)
Potassium: 3.8 mmol/L (ref 3.5–5.1)
Sodium: 133 mmol/L — ABNORMAL LOW (ref 135–145)
Total Bilirubin: 0.8 mg/dL (ref 0.0–1.2)
Total Protein: 6.9 g/dL (ref 6.5–8.1)

## 2024-07-07 LAB — CBC WITH DIFFERENTIAL (CANCER CENTER ONLY)
Abs Immature Granulocytes: 0.05 K/uL (ref 0.00–0.07)
Basophils Absolute: 0.1 K/uL (ref 0.0–0.1)
Basophils Relative: 1 %
Eosinophils Absolute: 0.1 K/uL (ref 0.0–0.5)
Eosinophils Relative: 3 %
HCT: 33.4 % — ABNORMAL LOW (ref 39.0–52.0)
Hemoglobin: 9.8 g/dL — ABNORMAL LOW (ref 13.0–17.0)
Immature Granulocytes: 1 %
Lymphocytes Relative: 16 %
Lymphs Abs: 0.9 K/uL (ref 0.7–4.0)
MCH: 22.7 pg — ABNORMAL LOW (ref 26.0–34.0)
MCHC: 29.3 g/dL — ABNORMAL LOW (ref 30.0–36.0)
MCV: 77.3 fL — ABNORMAL LOW (ref 80.0–100.0)
Monocytes Absolute: 0.4 K/uL (ref 0.1–1.0)
Monocytes Relative: 7 %
Neutro Abs: 4.1 K/uL (ref 1.7–7.7)
Neutrophils Relative %: 72 %
Platelet Count: 166 K/uL (ref 150–400)
RBC: 4.32 MIL/uL (ref 4.22–5.81)
RDW: 20.3 % — ABNORMAL HIGH (ref 11.5–15.5)
WBC Count: 5.7 K/uL (ref 4.0–10.5)
nRBC: 0 % (ref 0.0–0.2)

## 2024-07-07 LAB — CEA (ACCESS): CEA (CHCC): 88.9 ng/mL — ABNORMAL HIGH (ref 0.00–5.00)

## 2024-07-07 LAB — TOTAL PROTEIN, URINE DIPSTICK: Protein, ur: NEGATIVE mg/dL

## 2024-07-07 MED ORDER — SODIUM CHLORIDE 0.9 % IV SOLN
135.0000 mg/m2 | Freq: Once | INTRAVENOUS | Status: AC
  Administered 2024-07-07: 300 mg via INTRAVENOUS
  Filled 2024-07-07: qty 15

## 2024-07-07 MED ORDER — DEXAMETHASONE SODIUM PHOSPHATE 10 MG/ML IJ SOLN
10.0000 mg | Freq: Once | INTRAMUSCULAR | Status: AC
  Administered 2024-07-07: 10 mg via INTRAVENOUS

## 2024-07-07 MED ORDER — ATROPINE SULFATE 1 MG/ML IV SOLN
0.5000 mg | Freq: Once | INTRAVENOUS | Status: AC | PRN
  Administered 2024-07-07: 0.5 mg via INTRAVENOUS
  Filled 2024-07-07: qty 1

## 2024-07-07 MED ORDER — FLUCONAZOLE 100 MG PO TABS: 100.0000 mg | ORAL_TABLET | Freq: Every day | ORAL | 1 refills | Status: DC

## 2024-07-07 MED ORDER — SODIUM CHLORIDE 0.9 % IV SOLN: Freq: Once | INTRAVENOUS | Status: AC

## 2024-07-07 MED ORDER — FLUOROURACIL CHEMO INJECTION 500 MG/10ML
200.0000 mg/m2 | Freq: Once | INTRAVENOUS | Status: AC
  Administered 2024-07-07: 450 mg via INTRAVENOUS
  Filled 2024-07-07: qty 9

## 2024-07-07 MED ORDER — SODIUM CHLORIDE 0.9 % IV SOLN
150.0000 mg | Freq: Once | INTRAVENOUS | Status: AC
  Administered 2024-07-07: 150 mg via INTRAVENOUS
  Filled 2024-07-07: qty 5

## 2024-07-07 MED ORDER — SODIUM CHLORIDE 0.9 % IV SOLN
200.0000 mg/m2 | Freq: Once | INTRAVENOUS | Status: AC
  Administered 2024-07-07: 444 mg via INTRAVENOUS
  Filled 2024-07-07: qty 22.2

## 2024-07-07 MED ORDER — SODIUM CHLORIDE 0.9 % IV SOLN
1600.0000 mg/m2 | INTRAVENOUS | Status: DC
  Administered 2024-07-07: 3500 mg via INTRAVENOUS
  Filled 2024-07-07: qty 50

## 2024-07-07 MED ORDER — SODIUM CHLORIDE 0.9 % IV SOLN
5.0000 mg/kg | Freq: Once | INTRAVENOUS | Status: AC
  Administered 2024-07-07: 600 mg via INTRAVENOUS
  Filled 2024-07-07: qty 16

## 2024-07-07 MED ORDER — PALONOSETRON HCL INJECTION 0.25 MG/5ML
0.2500 mg | Freq: Once | INTRAVENOUS | Status: AC
  Administered 2024-07-07: 0.25 mg via INTRAVENOUS
  Filled 2024-07-07: qty 5

## 2024-07-07 NOTE — Patient Instructions (Signed)

## 2024-07-07 NOTE — Progress Notes (Signed)
 [facility] OFFICE PROGRESS NOTE   Diagnosis: Colon cancer  INTERVAL HISTORY:   Anthony Calhoun returns as scheduled.  He completed another cycle of FOLFIRI/bevacizumab  06/19/2024.  He continues to have intermittent nausea.  No vomiting.  Tongue becomes sore and he develops white patches in his mouth after each treatment.  Symptoms resolve with fluconazole .  He had loose stools after chemotherapy.  Resolved without intervention.  He denies bleeding.  Objective:  Vital signs in last 24 hours:  Blood pressure 128/73, pulse 86, temperature 98.1 F (36.7 C), temperature source Temporal, resp. rate 18, height 5' 6 (1.676 m), weight 250 lb 4.8 oz (113.5 kg), SpO2 97%.    HEENT: Mild white coating over tongue.  No buccal thrush.  No ulcers. Resp: Lungs clear bilaterally. Cardio: Regular rate and rhythm. GI: No hepatosplenomegaly.  Nontender. Vascular: No leg edema. Skin: Palms without erythema. Port-A-Cath without erythema.  Lab Results:  Lab Results  Component Value Date   WBC 5.7 07/07/2024   HGB 9.8 (L) 07/07/2024   HCT 33.4 (L) 07/07/2024   MCV 77.3 (L) 07/07/2024   PLT 166 07/07/2024   NEUTROABS 4.1 07/07/2024    Imaging:  No results found.  Medications: I have reviewed the patient's current medications.  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 secondary to nausea (patient discontinued Xeloda  on day 11 due to colonoscopy) Colonoscopy 07/09/2020-nodular mucosa at the  colonic anastomosis, biopsied; patent end-to-side ileocolonic anastomosis characterized by healthy-appearing mucosa and visible sutures; nonbleeding internal hemorrhoids, biopsy with benign ulcerated anastomotic mucosa with granulation tissue Cycle 4 adjuvant Xeloda  07/19/2020, 2000 mg every morning and 1500 mg every afternoon Cycle 5 adjuvant Xeloda  08/09/2020 Cycle 6 adjuvant Xeloda  08/31/2019 Cycle 7 adjuvant Xeloda  09/20/2020 Cycle 8 adjuvant Xeloda  10/11/2020 CTs 03/07/2021-no evidence of recurrent disease, hepatic steatosis, slight wall thickening at the junction of the descending and horizontal duodenum Mild elevation of CEA 2023 12/01/2021-Guardant Reveal-ct DNA detected PET 12/30/2021-hypermetabolic right liver lesion, hypermetabolic area in a loop of mid small bowel with an SUV of 12.5, review of PET images at GI tumor conference 01/11/2022-small bowel uptake felt to be a benign finding MRI liver 01/10/2022-solitary 1.2 cm right liver lesion between segments 7 and 8 compatible with metastasis, subtle changes suggesting cirrhosis, hepatic steatosis CT enteroscopy 02/02/2022-small bowel loop with hypermetabolic activity on PET continues to have a bandlike density along the margin felt to represent a diverticulum or adjacent nodal tissue.  Small bowel tumor not excluded.  3-4 mm left omental nodule 02/08/2022-biopsy and ablation of solitary liver lesion, adenocarcinoma consistent with metastatic colorectal adenocarcinoma CTs 03/31/2022-unchanged circumstantial soft tissue thickening surrounding a loop of mid to distal small bowel with an adjacent nodular focus measuring 1.2 cm.,  Ablation cavity in segment 7, no other evidence of metastatic disease Diagnostic laparoscopy 05/26/2022-mid small bowel mass-resected, well to moderately differentiated adenocarcinoma, 0/2 lymph nodes, morphology consistent with a colon primary, tumor involved full-thickness including serosa with perineural and large vessel involvement;  foundation 1-microsatellite stable, tumor mutation burden 2, K-ras Q61H CT abdomen/pelvis 07/10/2022-the right liver ablation defect is smaller, new subtle right liver  lesion measuring 13 x 9 mm, enlarged lymph node in the right upper quadrant mesentery adjacent to surgical anastomosis PET 07/26/2022-new segment 6 liver lesion, enlarging hypermetabolic left omental lesion, there is another mildly hypermetabolic peritoneal implant, 7 mm omental nodule at the midline without hypermetabolism Cycle 1 FOLFOX 09/25/2022 Cycle 2 FOLFOX/bevacizumab  10/09/2022, 5-FU dose reduced secondary to mucositis Cycle 3 FOLFOX/bevacizumab  10/23/2022 Cycle 4 FOLFOX/bevacizumab  11/06/2022 Cycle 5 FOLFOX/bevacizumab  11/20/2022 Cycle 6 FOLFOX/bevacizumab  12/04/2022 CT abdomen/pelvis 12/14/2022-further contraction of the treated segment 7 lesion, slight enlargement of segment 6 lesion, no new liver lesion, increased size of ileocolic nodes and an omental lesion, stable small retroperitoneal nodes Cycle 7 FOLFOX/bevacizumab  12/18/2022 Cycle 8 FOLFOX/bevacizumab  01/02/2023, oxaliplatin  dose reduced secondary to prolonged nausea and neuropathy symptoms, Emend added Cycle 9 FOLFOX/bevacizumab  01/16/2023, oxaliplatin  discontinued secondary to neuropathy Cycle 1 FOLFIRI 02/19/2023, Avastin  held due to recent dental extractions Cycle 2 FOLFIRI/Avastin  03/05/2023 Cycle 3 FOLFIRI/Avastin  03/19/2023, 5-FU bolus held due to mucositis after cycle 2, Fulphila  added Cycle 4 FOLFIRI/Avastin  04/02/2023, Fulphila , Emend Cycle 5 FOLFIRI/Avastin  04/16/2023, Fulphila , Emend CTs 05/02/2023-similar ileocolic mesenteric adenopathy and omental metastasis, cirrhosis with portal hypertension, stable liver lesions, no new or progressive disease Cycle 6 FOLFIRI/Avastin  05/07/2023, Fulphila , Emend Cycle 7 FOLFIRI/Avastin  05/21/2023, Fulphila , Emend Cycle 8 FOLFIRI 06/04/2023, Fulphila , Emend; Avastin  held Cycle 9 FOLFIRI 06/18/2023, Fulphila , Emend, Avastin   held Treatment held 07/09/2023 patient request Cycle 10 FOLFIRI 07/18/2023, Fulphila , Emend, Avastin  held CTs 07/31/2023: Stable with unchanged hepatic lesions, mesenteric lymph nodes, and left omental nodule, unchanged morphologic changes of cirrhosis and portal hypertension Cycle 11 FOLFIRI/Avastin  08/01/2023, Fulphila  Chemotherapy held 08/15/2023 per patient request Cycle 12 FOLFIRI/Avastin  08/30/2023, Udenyca  Cycle 13 FOLFIRI/Avastin  09/19/2023, Udenyca  Cycle 14 FOLFIRI/Avastin  10/01/2023, Udenyca  Cycle 15 FOLFIRI/Avastin  10/22/2023, Udenyca  11/05/2023 treatment held per patient request due to dental issue Cycle 16 FOLFIRI 11/19/2023, Udenyca , Avastin  held secondary to planned dental extractions CTs 11/28/2023: No evidence of progressive disease, stable hepatic, left upper quadrant omental, and ileocolic mesenteric lesions Cycle 17 FOLFIRI/Avastin  12/10/2023 Cycle 18 FOLFIRI 12/31/2023, Avastin  held secondary to 12/27/2023 tooth extraction Cycle 19 FOLFIRI/Avastin  01/22/2024 Cycle 20 FOLFIRI/Avastin  02/12/2024 03/08/2024 CTs: Stable small liver lesions, stable peritoneal nodules, stable left omental soft tissue mass, no evidence of disease progression, cirrhosis/portal hypertension Cycle 21 FOLFIRI/bevacizumab  03/17/2024 Treatment held 04/07/2024 per patient request Cycle 22 FOLFIRI/bevacizumab  04/14/2024  CTs 05/14/2024-no significant interval change of multiple ill-defined hypoattenuating liver lesions and peritoneal carcinomatosis.  No new metastatic disease in abdomen or pelvis. Cycle 23 FOLFIRI/bevacizumab  05/19/2024  Cycle 24 FOLFIRI/bevacizumab  06/19/2024 Cycle 25 FOLFIRI/bevacizumab  07/07/2024 Microcytic anemia secondary to #1-improved Colon polyps-sessile serrated adenoma of the descending colon, tubular adenomas with focal high-grade dysplasia of the transverse colon on colonoscopy 02/12/2020, tubular adenoma of the mid ascending colon on the surgical specimen 04/12/2020 Family history of colon  cancer Depression Anxiety/panic attacks Hypertension Hyperlipidemia Nausea secondary to Xeloda ?-Resolved Gastroesophageal reflux disease-improved following discontinuation of Xeloda  Anemia, microcytic; ferritin 6 01/24/2022; stool positive for blood x3 01/25/2022; 02/02/2022 CT abdomen/pelvis enterography-bowel loop demonstrating hypermetabolic activity continues to have a bandlike density along its margin possibly representing a diverticulum or adjacent nodal tissue, difficult to exclude small bowel tumor, 3 x 4 mm left omental nodule or lymph node, known right hepatic lobe tumor is occult on CT. Venofer  weekly x3 beginning 03/03/2022 Negative capsule endoscopy 03/09/2022 Venofer  03/03/2022 for 3 weekly doses Venofer  05/03/2022, 05/19/2022 Venofer  07/28/2022, 08/04/2022 Venofer  03/22/2023, 03/29/2023, 04/04/2023 Venofer  06/18/2023, 07/09/2023 07/03/2023 upper endoscopy and colonoscopy-severe portal hypertensive gastropathy in the gastric fundus, gastric body, stomach, red blood found greater curvature of  the stomach and gastric antrum, treated with APC; colonoscopy showed dark old maroon-colored blood in the entire colon.  No evidence of active or recent bleeding in the colon. 12.  Anterior left neck nodule 03/17/2022-cyst?,  Lymph node? 13.  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  Oxaliplatin  neuropathy-mild loss of vibratory sense 12/04/2022, 12/18/2022, 01/02/2023    Disposition: Anthony Calhoun appears stable.  He continues FOLFIRI/bevacizumab .  Overall tolerating well.  No change in baseline nausea.  He had loose stools which resolved without intervention.  Plan to proceed with another cycle today.  CBC and chemistry panel reviewed.  Labs adequate for treatment.  He will return for follow-up  and treatment in 3 weeks.  We are available to see him sooner if needed.    [name] ANP/GNP-BC   07/07/2024  11:35 AM

## 2024-07-07 NOTE — Progress Notes (Signed)
 Patient seen by [name] NP today  Vitals are within treatment parameters:Yes   Labs are within treatment parameters: Yes   Treatment plan has been signed: Yes   Per physician team, Patient is ready for treatment and there are NO modifications to the treatment plan.

## 2024-07-07 NOTE — Patient Instructions (Signed)
 [facility] - A DEPT OF . [facility]  Discharge Instructions: Thank you for choosing [facility] to provide your oncology and hematology care.   If you have a lab appointment with the Cancer Center, please go directly to the Cancer Center and check in at the registration area.   Wear comfortable clothing and clothing appropriate for easy access to any Portacath or PICC line.   We strive to give you quality time with your provider. You may need to reschedule your appointment if you arrive late (15 or more minutes).  Arriving late affects you and other patients whose appointments are after yours.  Also, if you miss three or more appointments without notifying the office, you may be dismissed from the clinic at the provider's discretion.      For prescription refill requests, have your pharmacy contact our office and allow 72 hours for refills to be completed.    Today you received the following chemotherapy and/or immunotherapy agents: bevacizumab , irinotecan , leucovorin , fluorouracil        To help prevent nausea and vomiting after your treatment, we encourage you to take your nausea medication as directed.  BELOW ARE SYMPTOMS THAT SHOULD BE REPORTED IMMEDIATELY: *FEVER GREATER THAN 100.4 F (38 C) OR HIGHER *CHILLS OR SWEATING *NAUSEA AND VOMITING THAT IS NOT CONTROLLED WITH YOUR NAUSEA MEDICATION *UNUSUAL SHORTNESS OF BREATH *UNUSUAL BRUISING OR BLEEDING *URINARY PROBLEMS (pain or burning when urinating, or frequent urination) *BOWEL PROBLEMS (unusual diarrhea, constipation, pain near the anus) TENDERNESS IN MOUTH AND THROAT WITH OR WITHOUT PRESENCE OF ULCERS (sore throat, sores in mouth, or a toothache) UNUSUAL RASH, SWELLING OR PAIN  UNUSUAL VAGINAL DISCHARGE OR ITCHING   Items with * indicate a potential emergency and should be followed up as soon as possible or go to the Emergency Department if any problems should occur.  Please show the  CHEMOTHERAPY ALERT CARD or IMMUNOTHERAPY ALERT CARD at check-in to the Emergency Department and triage nurse.  Should you have questions after your visit or need to cancel or reschedule your appointment, please contact [facility] - A DEPT OF [facility]  Dept: [phone]  and follow the prompts.  Office hours are 8:00 a.m. to 4:30 p.m. Monday - Friday. Please note that voicemails left after 4:00 p.m. may not be returned until the following business day.  We are closed weekends and major holidays. You have access to a nurse at all times for urgent questions. Please call the main number to the clinic Dept: [phone] and follow the prompts.   For any non-urgent questions, you may also contact your provider using MyChart. We now offer e-Visits for anyone 14 and older to request care online for non-urgent symptoms. For details visit [email or web address].   Also download the MyChart app! Go to the app store, search MyChart, open the app, select [facility], and log in with your MyChart username and password.

## 2024-07-09 ENCOUNTER — Inpatient Hospital Stay

## 2024-07-09 ENCOUNTER — Other Ambulatory Visit (HOSPITAL_BASED_OUTPATIENT_CLINIC_OR_DEPARTMENT_OTHER): Payer: Self-pay

## 2024-07-09 ENCOUNTER — Inpatient Hospital Stay: Admitting: Nurse Practitioner

## 2024-07-09 VITALS — BP 130/67 | HR 72 | Temp 98.1°F | Resp 18

## 2024-07-09 DIAGNOSIS — Z5111 Encounter for antineoplastic chemotherapy: Secondary | ICD-10-CM | POA: Diagnosis not present

## 2024-07-09 DIAGNOSIS — C189 Malignant neoplasm of colon, unspecified: Secondary | ICD-10-CM

## 2024-07-09 MED ORDER — PEGFILGRASTIM-CBQV 6 MG/0.6ML ~~LOC~~ SOSY
6.0000 mg | PREFILLED_SYRINGE | Freq: Once | SUBCUTANEOUS | Status: AC
  Administered 2024-07-09: 6 mg via SUBCUTANEOUS
  Filled 2024-07-09: qty 0.6

## 2024-07-09 NOTE — Patient Instructions (Signed)

## 2024-07-10 ENCOUNTER — Other Ambulatory Visit: Payer: Self-pay

## 2024-07-11 ENCOUNTER — Encounter: Payer: Self-pay | Admitting: Oncology

## 2024-07-11 ENCOUNTER — Inpatient Hospital Stay

## 2024-07-23 ENCOUNTER — Encounter: Payer: Self-pay | Admitting: Oncology

## 2024-07-23 ENCOUNTER — Other Ambulatory Visit (HOSPITAL_BASED_OUTPATIENT_CLINIC_OR_DEPARTMENT_OTHER): Payer: Self-pay

## 2024-07-25 ENCOUNTER — Encounter: Payer: Self-pay | Admitting: Nurse Practitioner

## 2024-07-25 ENCOUNTER — Other Ambulatory Visit (HOSPITAL_BASED_OUTPATIENT_CLINIC_OR_DEPARTMENT_OTHER): Payer: Self-pay

## 2024-07-25 ENCOUNTER — Encounter: Payer: Self-pay | Admitting: Oncology

## 2024-07-26 ENCOUNTER — Other Ambulatory Visit: Payer: Self-pay | Admitting: Oncology

## 2024-07-28 ENCOUNTER — Encounter: Payer: Self-pay | Admitting: Oncology

## 2024-07-29 ENCOUNTER — Inpatient Hospital Stay

## 2024-07-29 ENCOUNTER — Inpatient Hospital Stay: Attending: Oncology

## 2024-07-29 ENCOUNTER — Inpatient Hospital Stay: Admitting: Nurse Practitioner

## 2024-07-29 ENCOUNTER — Encounter: Payer: Self-pay | Admitting: Oncology

## 2024-07-30 ENCOUNTER — Encounter: Payer: Self-pay | Admitting: Oncology

## 2024-07-30 ENCOUNTER — Other Ambulatory Visit: Payer: Self-pay | Admitting: Oncology

## 2024-07-30 MED ORDER — NYSTATIN 100000 UNIT/ML MT SUSP: 5.0000 mL | Freq: Four times a day (QID) | OROMUCOSAL | 1 refills | Status: DC | PRN

## 2024-07-31 ENCOUNTER — Inpatient Hospital Stay

## 2024-07-31 ENCOUNTER — Encounter: Payer: Self-pay | Admitting: Oncology

## 2024-07-31 ENCOUNTER — Other Ambulatory Visit (HOSPITAL_BASED_OUTPATIENT_CLINIC_OR_DEPARTMENT_OTHER): Payer: Self-pay

## 2024-08-02 ENCOUNTER — Other Ambulatory Visit (HOSPITAL_BASED_OUTPATIENT_CLINIC_OR_DEPARTMENT_OTHER): Payer: Self-pay

## 2024-08-02 ENCOUNTER — Other Ambulatory Visit: Payer: Self-pay | Admitting: Oncology

## 2024-08-02 MED ORDER — NYSTATIN 100000 UNIT/ML MT SUSP
5.0000 mL | Freq: Four times a day (QID) | OROMUCOSAL | 1 refills | Status: DC | PRN
  Filled 2024-08-02: qty 240, 12d supply, fill #0

## 2024-08-07 ENCOUNTER — Inpatient Hospital Stay

## 2024-08-07 ENCOUNTER — Encounter: Payer: Self-pay | Admitting: Oncology

## 2024-08-07 ENCOUNTER — Other Ambulatory Visit (HOSPITAL_BASED_OUTPATIENT_CLINIC_OR_DEPARTMENT_OTHER): Payer: Self-pay

## 2024-08-07 ENCOUNTER — Inpatient Hospital Stay: Attending: Oncology

## 2024-08-07 ENCOUNTER — Encounter: Payer: Self-pay | Admitting: Nurse Practitioner

## 2024-08-07 ENCOUNTER — Inpatient Hospital Stay: Attending: Oncology | Admitting: Nurse Practitioner

## 2024-08-07 VITALS — BP 132/71 | HR 84 | Temp 98.1°F | Resp 18 | Ht 66.0 in | Wt 248.2 lb

## 2024-08-07 VITALS — BP 126/71 | HR 76 | Temp 98.1°F | Resp 18

## 2024-08-07 DIAGNOSIS — I10 Essential (primary) hypertension: Secondary | ICD-10-CM | POA: Insufficient documentation

## 2024-08-07 DIAGNOSIS — Z79899 Other long term (current) drug therapy: Secondary | ICD-10-CM | POA: Diagnosis not present

## 2024-08-07 DIAGNOSIS — C182 Malignant neoplasm of ascending colon: Secondary | ICD-10-CM | POA: Insufficient documentation

## 2024-08-07 DIAGNOSIS — C787 Secondary malignant neoplasm of liver and intrahepatic bile duct: Secondary | ICD-10-CM

## 2024-08-07 DIAGNOSIS — Z5111 Encounter for antineoplastic chemotherapy: Secondary | ICD-10-CM | POA: Insufficient documentation

## 2024-08-07 DIAGNOSIS — F410 Panic disorder [episodic paroxysmal anxiety] without agoraphobia: Secondary | ICD-10-CM | POA: Diagnosis not present

## 2024-08-07 DIAGNOSIS — D509 Iron deficiency anemia, unspecified: Secondary | ICD-10-CM | POA: Insufficient documentation

## 2024-08-07 DIAGNOSIS — E785 Hyperlipidemia, unspecified: Secondary | ICD-10-CM | POA: Insufficient documentation

## 2024-08-07 DIAGNOSIS — C189 Malignant neoplasm of colon, unspecified: Secondary | ICD-10-CM | POA: Diagnosis not present

## 2024-08-07 DIAGNOSIS — F32A Depression, unspecified: Secondary | ICD-10-CM | POA: Insufficient documentation

## 2024-08-07 DIAGNOSIS — Z800 Family history of malignant neoplasm of digestive organs: Secondary | ICD-10-CM | POA: Insufficient documentation

## 2024-08-07 DIAGNOSIS — K219 Gastro-esophageal reflux disease without esophagitis: Secondary | ICD-10-CM | POA: Insufficient documentation

## 2024-08-07 LAB — CBC WITH DIFFERENTIAL (CANCER CENTER ONLY)
Abs Immature Granulocytes: 0.01 K/uL (ref 0.00–0.07)
Basophils Absolute: 0.1 K/uL (ref 0.0–0.1)
Basophils Relative: 1 %
Eosinophils Absolute: 0.2 K/uL (ref 0.0–0.5)
Eosinophils Relative: 3 %
HCT: 32.6 % — ABNORMAL LOW (ref 39.0–52.0)
Hemoglobin: 9.7 g/dL — ABNORMAL LOW (ref 13.0–17.0)
Immature Granulocytes: 0 %
Lymphocytes Relative: 16 %
Lymphs Abs: 0.9 K/uL (ref 0.7–4.0)
MCH: 21.9 pg — ABNORMAL LOW (ref 26.0–34.0)
MCHC: 29.8 g/dL — ABNORMAL LOW (ref 30.0–36.0)
MCV: 73.8 fL — ABNORMAL LOW (ref 80.0–100.0)
Monocytes Absolute: 0.3 K/uL (ref 0.1–1.0)
Monocytes Relative: 6 %
Neutro Abs: 3.8 K/uL (ref 1.7–7.7)
Neutrophils Relative %: 74 %
Platelet Count: 189 K/uL (ref 150–400)
RBC: 4.42 MIL/uL (ref 4.22–5.81)
RDW: 18.5 % — ABNORMAL HIGH (ref 11.5–15.5)
WBC Count: 5.3 K/uL (ref 4.0–10.5)
nRBC: 0 % (ref 0.0–0.2)

## 2024-08-07 LAB — CMP (CANCER CENTER ONLY)
ALT: 36 U/L (ref 0–44)
AST: 30 U/L (ref 15–41)
Albumin: 4 g/dL (ref 3.5–5.0)
Alkaline Phosphatase: 188 U/L — ABNORMAL HIGH (ref 38–126)
Anion gap: 9 (ref 5–15)
BUN: 9 mg/dL (ref 6–20)
CO2: 28 mmol/L (ref 22–32)
Calcium: 9.2 mg/dL (ref 8.9–10.3)
Chloride: 98 mmol/L (ref 98–111)
Creatinine: 0.78 mg/dL (ref 0.61–1.24)
GFR, Estimated: >60 mL/min (ref >60)
Glucose, Bld: 283 mg/dL — ABNORMAL HIGH (ref 70–99)
Potassium: 4.2 mmol/L (ref 3.5–5.1)
Sodium: 135 mmol/L (ref 135–145)
Total Bilirubin: 1 mg/dL (ref 0.0–1.2)
Total Protein: 7.5 g/dL (ref 6.5–8.1)

## 2024-08-07 LAB — TOTAL PROTEIN, URINE DIPSTICK

## 2024-08-07 LAB — CEA (ACCESS): CEA (CHCC): 167.8 ng/mL — ABNORMAL HIGH (ref 0.00–5.00)

## 2024-08-07 MED ORDER — PALONOSETRON HCL INJECTION 0.25 MG/5ML
0.2500 mg | Freq: Once | INTRAVENOUS | Status: AC
  Administered 2024-08-07: 0.25 mg via INTRAVENOUS
  Filled 2024-08-07: qty 5

## 2024-08-07 MED ORDER — SODIUM CHLORIDE 0.9 % IV SOLN
135.0000 mg/m2 | Freq: Once | INTRAVENOUS | Status: AC
  Administered 2024-08-07: 300 mg via INTRAVENOUS
  Filled 2024-08-07: qty 15

## 2024-08-07 MED ORDER — SODIUM CHLORIDE 0.9 % IV SOLN
1600.0000 mg/m2 | INTRAVENOUS | Status: DC
  Administered 2024-08-07: 3500 mg via INTRAVENOUS
  Filled 2024-08-07: qty 70

## 2024-08-07 MED ORDER — DEXAMETHASONE SODIUM PHOSPHATE 10 MG/ML IJ SOLN
10.0000 mg | Freq: Once | INTRAMUSCULAR | Status: AC
  Administered 2024-08-07: 10 mg via INTRAVENOUS

## 2024-08-07 MED ORDER — SODIUM CHLORIDE 0.9 % IV SOLN
200.0000 mg/m2 | Freq: Once | INTRAVENOUS | Status: AC
  Administered 2024-08-07: 444 mg via INTRAVENOUS
  Filled 2024-08-07: qty 22.2

## 2024-08-07 MED ORDER — NYSTATIN 100000 UNIT/ML MT SUSP
5.0000 mL | Freq: Four times a day (QID) | OROMUCOSAL | 1 refills | Status: DC
  Filled 2024-08-07: qty 140, 7d supply, fill #0

## 2024-08-07 MED ORDER — ATROPINE SULFATE 1 MG/ML IV SOLN
0.5000 mg | Freq: Once | INTRAVENOUS | Status: AC | PRN
  Administered 2024-08-07: 0.5 mg via INTRAVENOUS
  Filled 2024-08-07: qty 1

## 2024-08-07 MED ORDER — SODIUM CHLORIDE 0.9 % IV SOLN
150.0000 mg | Freq: Once | INTRAVENOUS | Status: AC
  Administered 2024-08-07: 150 mg via INTRAVENOUS
  Filled 2024-08-07: qty 150

## 2024-08-07 MED ORDER — SODIUM CHLORIDE 0.9 % IV SOLN: Freq: Once | INTRAVENOUS | Status: AC

## 2024-08-07 MED ORDER — SODIUM CHLORIDE 0.9 % IV SOLN
5.0000 mg/kg | Freq: Once | INTRAVENOUS | Status: AC
  Administered 2024-08-07: 600 mg via INTRAVENOUS
  Filled 2024-08-07: qty 16

## 2024-08-07 MED ORDER — FLUOROURACIL CHEMO INJECTION 500 MG/10ML
200.0000 mg/m2 | Freq: Once | INTRAVENOUS | Status: AC
  Administered 2024-08-07: 450 mg via INTRAVENOUS
  Filled 2024-08-07: qty 9

## 2024-08-07 NOTE — Patient Instructions (Signed)

## 2024-08-07 NOTE — Progress Notes (Signed)
 [facility] OFFICE PROGRESS NOTE   Diagnosis: Colon cancer  INTERVAL HISTORY:   Anthony Calhoun returns for follow-up.  He completed a cycle of FOLFIRI/bevacizumab  07/07/2024.  He canceled appointments scheduled on 07/29/2024.  He continues to have intermittent nausea.  For a few days after each treatment he has loose stools.  This typically last for 3 days, 2 or 3 stools a day.  He does not take an antidiarrheal.  He periodically notes abdominal bloating.  A few days ago he had pain at the right upper abdomen.  No pain today.  He is concerned regarding thrush.  No bleeding.  No leg swelling or calf pain.  Objective:  Vital signs in last 24 hours:  Blood pressure 132/71, pulse 84, temperature 98.1 F (36.7 C), temperature source Temporal, resp. rate 18, height 5' 6 (1.676 m), weight 248 lb 3.2 oz (112.6 kg), SpO2 98%.    HEENT: Mild white coating over tongue.  No buccal thrush.  No ulcers. Resp: Lungs clear bilaterally. Cardio: Regular rate and rhythm. GI: No hepatosplenomegaly.  Mild tenderness at the right upper abdomen.  No apparent ascites. Vascular: No leg edema. Skin: Palms without erythema. Port-A-Cath without erythema.  Lab Results:  Lab Results  Component Value Date   WBC 5.3 08/07/2024   HGB 9.7 (L) 08/07/2024   HCT 32.6 (L) 08/07/2024   MCV 73.8 (L) 08/07/2024   PLT 189 08/07/2024   NEUTROABS 3.8 08/07/2024    Imaging:  No results found.  Medications: I have reviewed the patient's current medications.  Assessment/Plan: Moderately differentiated adenocarcinoma of the ascending colon, stage IIb (T4aN0), status post a right colectomy 04/12/2020 Tumor invades the visceral peritoneum, 0/19 lymph nodes, no lymphovascular or perineural invasion, no tumor deposits, MSS, no loss of mismatch repair protein expression Cycle 1 adjuvant Xeloda  05/17/2020 Cycle 2 adjuvant Xeloda  06/07/2020, Xeloda  discontinued after 12 days secondary to nausea and rectal  bleeding Cycle 3 adjuvant Xeloda  06/28/2020, dose reduced to 2000 mg a.m., 1500 mg p.m. secondary to nausea (patient discontinued Xeloda  on day 11 due to colonoscopy) Colonoscopy 07/09/2020-nodular mucosa at the colonic anastomosis, biopsied; patent end-to-side ileocolonic anastomosis characterized by healthy-appearing mucosa and visible sutures; nonbleeding internal hemorrhoids, biopsy with benign ulcerated anastomotic mucosa with granulation tissue Cycle 4 adjuvant Xeloda  07/19/2020, 2000 mg every morning and 1500 mg every afternoon Cycle 5 adjuvant Xeloda  08/09/2020 Cycle 6 adjuvant Xeloda  08/31/2019 Cycle 7 adjuvant Xeloda  09/20/2020 Cycle 8 adjuvant Xeloda  10/11/2020 CTs 03/07/2021-no evidence of recurrent disease, hepatic steatosis, slight wall thickening at the junction of the descending and horizontal duodenum Mild elevation of CEA 2023 12/01/2021-Guardant Reveal-ct DNA detected PET 12/30/2021-hypermetabolic right liver lesion, hypermetabolic area in a loop of mid small bowel with an SUV of 12.5, review of PET images at GI tumor conference 01/11/2022-small bowel uptake felt to be a benign finding MRI liver 01/10/2022-solitary 1.2 cm right liver lesion between segments 7 and 8 compatible with metastasis, subtle changes suggesting cirrhosis, hepatic steatosis CT enteroscopy 02/02/2022-small bowel loop with hypermetabolic activity on PET continues to have a bandlike density along the margin felt to represent a diverticulum or adjacent nodal tissue.  Small bowel tumor not excluded.  3-4 mm left omental nodule 02/08/2022-biopsy and ablation of solitary liver lesion, adenocarcinoma consistent with metastatic colorectal adenocarcinoma CTs 03/31/2022-unchanged circumstantial soft tissue thickening surrounding a loop of mid to distal small bowel with an adjacent nodular focus measuring 1.2 cm.,  Ablation cavity in segment 7, no other evidence of metastatic disease Diagnostic laparoscopy 05/26/2022-mid small  bowel  mass-resected, well to moderately differentiated adenocarcinoma, 0/2 lymph nodes, morphology consistent with a colon primary, tumor involved full-thickness including serosa with perineural and large vessel involvement; foundation 1-microsatellite stable, tumor mutation burden 2, K-ras Q61H CT abdomen/pelvis 07/10/2022-the right liver ablation defect is smaller, new subtle right liver lesion measuring 13 x 9 mm, enlarged lymph node in the right upper quadrant mesentery adjacent to surgical anastomosis PET 07/26/2022-new segment 6 liver lesion, enlarging hypermetabolic left omental lesion, there is another mildly hypermetabolic peritoneal implant, 7 mm omental nodule at the midline without hypermetabolism Cycle 1 FOLFOX 09/25/2022 Cycle 2 FOLFOX/bevacizumab  10/09/2022, 5-FU dose reduced secondary to mucositis Cycle 3 FOLFOX/bevacizumab  10/23/2022 Cycle 4 FOLFOX/bevacizumab  11/06/2022 Cycle 5 FOLFOX/bevacizumab  11/20/2022 Cycle 6 FOLFOX/bevacizumab  12/04/2022 CT abdomen/pelvis 12/14/2022-further contraction of the treated segment 7 lesion, slight enlargement of segment 6 lesion, no new liver lesion, increased size of ileocolic nodes and an omental lesion, stable small retroperitoneal nodes Cycle 7 FOLFOX/bevacizumab  12/18/2022 Cycle 8 FOLFOX/bevacizumab  01/02/2023, oxaliplatin  dose reduced secondary to prolonged nausea and neuropathy symptoms, Emend added Cycle 9 FOLFOX/bevacizumab  01/16/2023, oxaliplatin  discontinued secondary to neuropathy Cycle 1 FOLFIRI 02/19/2023, Avastin  held due to recent dental extractions Cycle 2 FOLFIRI/Avastin  03/05/2023 Cycle 3 FOLFIRI/Avastin  03/19/2023, 5-FU bolus held due to mucositis after cycle 2, Fulphila  added Cycle 4 FOLFIRI/Avastin  04/02/2023, Fulphila , Emend Cycle 5 FOLFIRI/Avastin  04/16/2023, Fulphila , Emend CTs 05/02/2023-similar ileocolic mesenteric adenopathy and omental metastasis, cirrhosis with portal hypertension, stable liver lesions, no new or progressive disease Cycle 6  FOLFIRI/Avastin  05/07/2023, Fulphila , Emend Cycle 7 FOLFIRI/Avastin  05/21/2023, Fulphila , Emend Cycle 8 FOLFIRI 06/04/2023, Fulphila , Emend; Avastin  held Cycle 9 FOLFIRI 06/18/2023, Fulphila , Emend, Avastin  held Treatment held 07/09/2023 patient request Cycle 10 FOLFIRI 07/18/2023, Fulphila , Emend, Avastin  held CTs 07/31/2023: Stable with unchanged hepatic lesions, mesenteric lymph nodes, and left omental nodule, unchanged morphologic changes of cirrhosis and portal hypertension Cycle 11 FOLFIRI/Avastin  08/01/2023, Fulphila  Chemotherapy held 08/15/2023 per patient request Cycle 12 FOLFIRI/Avastin  08/30/2023, Udenyca  Cycle 13 FOLFIRI/Avastin  09/19/2023, Udenyca  Cycle 14 FOLFIRI/Avastin  10/01/2023, Udenyca  Cycle 15 FOLFIRI/Avastin  10/22/2023, Udenyca  11/05/2023 treatment held per patient request due to dental issue Cycle 16 FOLFIRI 11/19/2023, Udenyca , Avastin  held secondary to planned dental extractions CTs 11/28/2023: No evidence of progressive disease, stable hepatic, left upper quadrant omental, and ileocolic mesenteric lesions Cycle 17 FOLFIRI/Avastin  12/10/2023 Cycle 18 FOLFIRI 12/31/2023, Avastin  held secondary to 12/27/2023 tooth extraction Cycle 19 FOLFIRI/Avastin  01/22/2024 Cycle 20 FOLFIRI/Avastin  02/12/2024 03/08/2024 CTs: Stable small liver lesions, stable peritoneal nodules, stable left omental soft tissue mass, no evidence of disease progression, cirrhosis/portal hypertension Cycle 21 FOLFIRI/bevacizumab  03/17/2024 Treatment held 04/07/2024 per patient request Cycle 22 FOLFIRI/bevacizumab  04/14/2024  CTs 05/14/2024-no significant interval change of multiple ill-defined hypoattenuating liver lesions and peritoneal carcinomatosis.  No new metastatic disease in abdomen or pelvis. Cycle 23 FOLFIRI/bevacizumab  05/19/2024  Cycle 24 FOLFIRI/bevacizumab  06/19/2024 Cycle 25 FOLFIRI/bevacizumab  07/07/2024 Cycle 26 FOLFIRI/bevacizumab  08/07/2024 Microcytic anemia secondary to #1-improved Colon polyps-sessile  serrated adenoma of the descending colon, tubular adenomas with focal high-grade dysplasia of the transverse colon on colonoscopy 02/12/2020, tubular adenoma of the mid ascending colon on the surgical specimen 04/12/2020 Family history of colon cancer Depression Anxiety/panic attacks Hypertension Hyperlipidemia Nausea secondary to Xeloda ?-Resolved Gastroesophageal reflux disease-improved following discontinuation of Xeloda  Anemia, microcytic; ferritin 6 01/24/2022; stool positive for blood x3 01/25/2022; 02/02/2022 CT abdomen/pelvis enterography-bowel loop demonstrating hypermetabolic activity continues to have a bandlike density along its margin possibly representing a diverticulum or adjacent nodal tissue, difficult to exclude small bowel tumor, 3 x 4 mm left omental nodule or lymph node, known right hepatic lobe  tumor is occult on CT. Venofer  weekly x3 beginning 03/03/2022 Negative capsule endoscopy 03/09/2022 Venofer  03/03/2022 for 3 weekly doses Venofer  05/03/2022, 05/19/2022 Venofer  07/28/2022, 08/04/2022 Venofer  03/22/2023, 03/29/2023, 04/04/2023 Venofer  06/18/2023, 07/09/2023 07/03/2023 upper endoscopy and colonoscopy-severe portal hypertensive gastropathy in the gastric fundus, gastric body, stomach, red blood found greater curvature of the stomach and gastric antrum, treated with APC; colonoscopy showed dark old maroon-colored blood in the entire colon.  No evidence of active or recent bleeding in the colon. 12.  Anterior left neck nodule 03/17/2022-cyst?,  Lymph node? 13.  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  Oxaliplatin  neuropathy-mild loss of vibratory sense 12/04/2022, 12/18/2022, 01/02/2023    Disposition: Anthony Calhoun appears unchanged.  He continues FOLFIRI/bevacizumab  on an  inconsistent schedule.  There is no clinical evidence of disease progression.  The nausea is not clearly related to chemotherapy.  He would like to continue with current treatment.  Plan to proceed with FOLFIRI/bevacizumab  today as scheduled.  We will follow-up on the CEA.  CBC and chemistry panel reviewed.  Labs are adequate for treatment.  Urine with trace protein.  He will return for follow-up and the next cycle of FOLFIRI/bevacizumab  on 08/27/2024.  We are available to see him sooner if needed.    [name] ANP/GNP-BC   08/07/2024  11:21 AM

## 2024-08-07 NOTE — Progress Notes (Signed)
 Patient seen by [name] NP today  Vitals are within treatment parameters:Yes  Decreased 2 pounds Labs are within treatment parameters: Yes   Treatment plan has been signed: Yes   Per physician team, Patient is ready for treatment and there are NO modifications to the treatment plan.

## 2024-08-07 NOTE — Telephone Encounter (Signed)
 Forwarded note to NP

## 2024-08-07 NOTE — Patient Instructions (Signed)
 [facility] - A DEPT OF . [facility]  Discharge Instructions: Thank you for choosing [facility] to provide your oncology and hematology care.   If you have a lab appointment with the Cancer Center, please go directly to the Cancer Center and check in at the registration area.   Wear comfortable clothing and clothing appropriate for easy access to any Portacath or PICC line.   We strive to give you quality time with your provider. You may need to reschedule your appointment if you arrive late (15 or more minutes).  Arriving late affects you and other patients whose appointments are after yours.  Also, if you miss three or more appointments without notifying the office, you may be dismissed from the clinic at the provider's discretion.      For prescription refill requests, have your pharmacy contact our office and allow 72 hours for refills to be completed.    Today you received the following chemotherapy and/or immunotherapy agents: bevacizumab , irinotecan , leucovorin , fluorouracil        To help prevent nausea and vomiting after your treatment, we encourage you to take your nausea medication as directed.  BELOW ARE SYMPTOMS THAT SHOULD BE REPORTED IMMEDIATELY: *FEVER GREATER THAN 100.4 F (38 C) OR HIGHER *CHILLS OR SWEATING *NAUSEA AND VOMITING THAT IS NOT CONTROLLED WITH YOUR NAUSEA MEDICATION *UNUSUAL SHORTNESS OF BREATH *UNUSUAL BRUISING OR BLEEDING *URINARY PROBLEMS (pain or burning when urinating, or frequent urination) *BOWEL PROBLEMS (unusual diarrhea, constipation, pain near the anus) TENDERNESS IN MOUTH AND THROAT WITH OR WITHOUT PRESENCE OF ULCERS (sore throat, sores in mouth, or a toothache) UNUSUAL RASH, SWELLING OR PAIN  UNUSUAL VAGINAL DISCHARGE OR ITCHING   Items with * indicate a potential emergency and should be followed up as soon as possible or go to the Emergency Department if any problems should occur.  Please show the  CHEMOTHERAPY ALERT CARD or IMMUNOTHERAPY ALERT CARD at check-in to the Emergency Department and triage nurse.  Should you have questions after your visit or need to cancel or reschedule your appointment, please contact [facility] - A DEPT OF [facility]  Dept: [phone]  and follow the prompts.  Office hours are 8:00 a.m. to 4:30 p.m. Monday - Friday. Please note that voicemails left after 4:00 p.m. may not be returned until the following business day.  We are closed weekends and major holidays. You have access to a nurse at all times for urgent questions. Please call the main number to the clinic Dept: [phone] and follow the prompts.   For any non-urgent questions, you may also contact your provider using MyChart. We now offer e-Visits for anyone 14 and older to request care online for non-urgent symptoms. For details visit [email or web address].   Also download the MyChart app! Go to the app store, search MyChart, open the app, select [facility], and log in with your MyChart username and password.

## 2024-08-08 ENCOUNTER — Other Ambulatory Visit: Payer: Self-pay

## 2024-08-09 ENCOUNTER — Inpatient Hospital Stay

## 2024-08-09 VITALS — BP 121/63 | HR 76 | Temp 97.6°F | Resp 17 | Ht 66.0 in

## 2024-08-09 DIAGNOSIS — Z5111 Encounter for antineoplastic chemotherapy: Secondary | ICD-10-CM | POA: Diagnosis not present

## 2024-08-09 DIAGNOSIS — C189 Malignant neoplasm of colon, unspecified: Secondary | ICD-10-CM

## 2024-08-09 MED ORDER — PEGFILGRASTIM-CBQV 6 MG/0.6ML ~~LOC~~ SOSY
6.0000 mg | PREFILLED_SYRINGE | Freq: Once | SUBCUTANEOUS | Status: AC
  Administered 2024-08-09: 6 mg via SUBCUTANEOUS
  Filled 2024-08-09: qty 0.6

## 2024-08-11 ENCOUNTER — Encounter: Payer: Self-pay | Admitting: Oncology

## 2024-08-12 ENCOUNTER — Ambulatory Visit: Admitting: Adult Health

## 2024-08-13 ENCOUNTER — Other Ambulatory Visit: Payer: Self-pay

## 2024-08-13 ENCOUNTER — Other Ambulatory Visit (HOSPITAL_COMMUNITY): Payer: Self-pay | Admitting: Gastroenterology

## 2024-08-13 ENCOUNTER — Encounter: Payer: Self-pay | Admitting: Oncology

## 2024-08-13 DIAGNOSIS — R110 Nausea: Secondary | ICD-10-CM

## 2024-08-13 DIAGNOSIS — R140 Abdominal distension (gaseous): Secondary | ICD-10-CM

## 2024-08-18 ENCOUNTER — Inpatient Hospital Stay

## 2024-08-18 ENCOUNTER — Inpatient Hospital Stay: Admitting: Nurse Practitioner

## 2024-08-20 ENCOUNTER — Inpatient Hospital Stay

## 2024-08-24 ENCOUNTER — Other Ambulatory Visit: Payer: Self-pay | Admitting: Oncology

## 2024-08-26 ENCOUNTER — Encounter (HOSPITAL_COMMUNITY): Admission: RE | Admit: 2024-08-26 | Source: Ambulatory Visit

## 2024-08-26 ENCOUNTER — Encounter: Payer: Self-pay | Admitting: Oncology

## 2024-08-26 DIAGNOSIS — R140 Abdominal distension (gaseous): Secondary | ICD-10-CM | POA: Diagnosis present

## 2024-08-26 DIAGNOSIS — R110 Nausea: Secondary | ICD-10-CM | POA: Diagnosis present

## 2024-08-26 MED ORDER — FLUOROURACIL CHEMO INJECTION 500 MG/10ML
200.0000 mg/m2 | Freq: Once | INTRAVENOUS | Status: AC
  Administered 2024-08-27: 450 mg via INTRAVENOUS
  Filled 2024-08-26: qty 9

## 2024-08-26 MED ORDER — TECHNETIUM TC 99M SULFUR COLLOID
2.0000 | Freq: Once | INTRAVENOUS | Status: AC | PRN
  Administered 2024-08-26: 2.17 via ORAL

## 2024-08-26 MED ORDER — SODIUM CHLORIDE 0.9 % IV SOLN
1600.0000 mg/m2 | INTRAVENOUS | Status: DC
  Administered 2024-08-27: 3500 mg via INTRAVENOUS
  Filled 2024-08-26: qty 70

## 2024-08-27 ENCOUNTER — Inpatient Hospital Stay

## 2024-08-27 ENCOUNTER — Inpatient Hospital Stay (HOSPITAL_BASED_OUTPATIENT_CLINIC_OR_DEPARTMENT_OTHER): Admitting: Oncology

## 2024-08-27 VITALS — BP 125/67 | HR 86 | Temp 97.8°F | Resp 18 | Ht 66.0 in | Wt 246.0 lb

## 2024-08-27 VITALS — BP 132/71 | HR 74 | Resp 16

## 2024-08-27 DIAGNOSIS — C787 Secondary malignant neoplasm of liver and intrahepatic bile duct: Secondary | ICD-10-CM | POA: Diagnosis not present

## 2024-08-27 DIAGNOSIS — C189 Malignant neoplasm of colon, unspecified: Secondary | ICD-10-CM

## 2024-08-27 DIAGNOSIS — Z5111 Encounter for antineoplastic chemotherapy: Secondary | ICD-10-CM | POA: Diagnosis not present

## 2024-08-27 LAB — CBC WITH DIFFERENTIAL (CANCER CENTER ONLY)
Abs Immature Granulocytes: 0.04 K/uL (ref 0.00–0.07)
Basophils Absolute: 0.1 K/uL (ref 0.0–0.1)
Basophils Relative: 1 %
Eosinophils Absolute: 0.1 K/uL (ref 0.0–0.5)
Eosinophils Relative: 2 %
HCT: 32.7 % — ABNORMAL LOW (ref 39.0–52.0)
Hemoglobin: 9.6 g/dL — ABNORMAL LOW (ref 13.0–17.0)
Immature Granulocytes: 1 %
Lymphocytes Relative: 12 %
Lymphs Abs: 0.7 K/uL (ref 0.7–4.0)
MCH: 21.8 pg — ABNORMAL LOW (ref 26.0–34.0)
MCHC: 29.4 g/dL — ABNORMAL LOW (ref 30.0–36.0)
MCV: 74.1 fL — ABNORMAL LOW (ref 80.0–100.0)
Monocytes Absolute: 0.5 K/uL (ref 0.1–1.0)
Monocytes Relative: 8 %
Neutro Abs: 4.5 K/uL (ref 1.7–7.7)
Neutrophils Relative %: 76 %
Platelet Count: 188 K/uL (ref 150–400)
RBC: 4.41 MIL/uL (ref 4.22–5.81)
RDW: 20.4 % — ABNORMAL HIGH (ref 11.5–15.5)
WBC Count: 6 K/uL (ref 4.0–10.5)
nRBC: 0 % (ref 0.0–0.2)

## 2024-08-27 LAB — CMP (CANCER CENTER ONLY)
ALT: 33 U/L (ref 0–44)
AST: 33 U/L (ref 15–41)
Albumin: 4 g/dL (ref 3.5–5.0)
Alkaline Phosphatase: 195 U/L — ABNORMAL HIGH (ref 38–126)
Anion gap: 10 (ref 5–15)
BUN: 12 mg/dL (ref 6–20)
CO2: 26 mmol/L (ref 22–32)
Calcium: 9.1 mg/dL (ref 8.9–10.3)
Chloride: 101 mmol/L (ref 98–111)
Creatinine: 0.76 mg/dL (ref 0.61–1.24)
GFR, Estimated: >60 mL/min
Glucose, Bld: 236 mg/dL — ABNORMAL HIGH (ref 70–99)
Potassium: 3.7 mmol/L (ref 3.5–5.1)
Sodium: 136 mmol/L (ref 135–145)
Total Bilirubin: 0.9 mg/dL (ref 0.0–1.2)
Total Protein: 7.6 g/dL (ref 6.5–8.1)

## 2024-08-27 LAB — FERRITIN: Ferritin: 24 ng/mL (ref 24–336)

## 2024-08-27 LAB — CEA (ACCESS): CEA (CHCC): 126.36 ng/mL — ABNORMAL HIGH (ref 0.00–5.00)

## 2024-08-27 LAB — TOTAL PROTEIN, URINE DIPSTICK: Protein, ur: 30 mg/dL — AB

## 2024-08-27 MED ORDER — SODIUM CHLORIDE 0.9 % IV SOLN: Freq: Once | INTRAVENOUS | Status: AC

## 2024-08-27 MED ORDER — FLUCONAZOLE 100 MG PO TABS: 100.0000 mg | ORAL_TABLET | Freq: Every day | ORAL | 0 refills | Status: DC

## 2024-08-27 MED ORDER — SODIUM CHLORIDE 0.9 % IV SOLN
200.0000 mg/m2 | Freq: Once | INTRAVENOUS | Status: AC
  Administered 2024-08-27: 444 mg via INTRAVENOUS
  Filled 2024-08-27: qty 22.2

## 2024-08-27 MED ORDER — SODIUM CHLORIDE 0.9 % IV SOLN
150.0000 mg | Freq: Once | INTRAVENOUS | Status: AC
  Administered 2024-08-27: 150 mg via INTRAVENOUS
  Filled 2024-08-27: qty 150

## 2024-08-27 MED ORDER — SODIUM CHLORIDE 0.9 % IV SOLN
135.0000 mg/m2 | Freq: Once | INTRAVENOUS | Status: AC
  Administered 2024-08-27: 300 mg via INTRAVENOUS
  Filled 2024-08-27: qty 15

## 2024-08-27 MED ORDER — SODIUM CHLORIDE 0.9 % IV SOLN
5.0000 mg/kg | Freq: Once | INTRAVENOUS | Status: AC
  Administered 2024-08-27: 600 mg via INTRAVENOUS
  Filled 2024-08-27: qty 16

## 2024-08-27 MED ORDER — DEXAMETHASONE SODIUM PHOSPHATE 10 MG/ML IJ SOLN
10.0000 mg | Freq: Once | INTRAMUSCULAR | Status: AC
  Administered 2024-08-27: 10 mg via INTRAVENOUS

## 2024-08-27 MED ORDER — ATROPINE SULFATE 1 MG/ML IV SOLN
0.5000 mg | Freq: Once | INTRAVENOUS | Status: AC | PRN
  Administered 2024-08-27: 0.5 mg via INTRAVENOUS
  Filled 2024-08-27: qty 1

## 2024-08-27 MED ORDER — PALONOSETRON HCL INJECTION 0.25 MG/5ML
0.2500 mg | Freq: Once | INTRAVENOUS | Status: AC
  Administered 2024-08-27: 0.25 mg via INTRAVENOUS
  Filled 2024-08-27: qty 5

## 2024-08-27 NOTE — Patient Instructions (Signed)

## 2024-08-27 NOTE — Progress Notes (Signed)
 Patient seen by Dr. Arley Hof today  Vitals are within treatment parameters:Yes   Labs are within treatment parameters: Yes   Treatment plan has been signed: Yes   Per physician team, Patient is ready for treatment and there are NO modifications to the treatment plan.

## 2024-08-27 NOTE — Patient Instructions (Addendum)
 [facility] - A DEPT OF [facility]  Discharge Instructions: Thank you for choosing [facility] to provide your oncology and hematology care.   If you have a lab appointment with the Cancer Center, please go directly to the Cancer Center and check in at the registration area.   Wear comfortable clothing and clothing appropriate for easy access to any Portacath or PICC line.   We strive to give you quality time with your provider. You may need to reschedule your appointment if you arrive late (15 or more minutes).  Arriving late affects you and other patients whose appointments are after yours.  Also, if you miss three or more appointments without notifying the office, you may be dismissed from the clinic at the providers discretion.      For prescription refill requests, have your pharmacy contact our office and allow 72 hours for refills to be completed.    Today you received the following chemotherapy and/or immunotherapy agents: Bevacizumab -awwb (MVASI ), Irinotecan  (Camptosar ), Leucovorin , Fluorouracil  (Adrucil , 5-FU)   To help prevent nausea and vomiting after your treatment, we encourage you to take your nausea medication as directed.  BELOW ARE SYMPTOMS THAT SHOULD BE REPORTED IMMEDIATELY: *FEVER GREATER THAN 100.4 F (38 C) OR HIGHER *CHILLS OR SWEATING *NAUSEA AND VOMITING THAT IS NOT CONTROLLED WITH YOUR NAUSEA MEDICATION *UNUSUAL SHORTNESS OF BREATH *UNUSUAL BRUISING OR BLEEDING *URINARY PROBLEMS (pain or burning when urinating, or frequent urination) *BOWEL PROBLEMS (unusual diarrhea, constipation, pain near the anus) TENDERNESS IN MOUTH AND THROAT WITH OR WITHOUT PRESENCE OF ULCERS (sore throat, sores in mouth, or a toothache) UNUSUAL RASH, SWELLING OR PAIN  UNUSUAL VAGINAL DISCHARGE OR ITCHING   Items with * indicate a potential emergency and should be followed up as soon as possible or go to the Emergency Department if any  problems should occur.  Please show the CHEMOTHERAPY ALERT CARD or IMMUNOTHERAPY ALERT CARD at check-in to the Emergency Department and triage nurse.  Should you have questions after your visit or need to cancel or reschedule your appointment, please contact [facility] - A DEPT OF [facility]  Dept: [phone]  and follow the prompts.  Office hours are 8:00 a.m. to 4:30 p.m. Monday - Friday. Please note that voicemails left after 4:00 p.m. may not be returned until the following business day.  We are closed weekends and major holidays. You have access to a nurse at all times for urgent questions. Please call the main number to the clinic Dept: [phone] and follow the prompts.   For any non-urgent questions, you may also contact your provider using MyChart. We now offer e-Visits for anyone 67 and older to request care online for non-urgent symptoms. For details visit [email or web address].   Also download the MyChart app! Go to the app store, search MyChart, open the app, select [place], and log in with your MyChart username and password.

## 2024-08-27 NOTE — Progress Notes (Signed)
 " [facility] OFFICE PROGRESS NOTE   Diagnosis: Colon cancer  INTERVAL HISTORY:   [name] completed another cycle of FOLFIRI/bevacizumab  on 08/07/2024.  He reports increased nausea following chemotherapy.  He has persistent nausea.  No bleeding or symptom of thrombosis.  No diarrhea.  The mouth is sore.  He thinks he is developing thrush .  Objective:  Vital signs in last 24 hours:  Blood pressure 125/67, pulse 86, temperature 97.8 F (36.6 C), temperature source Temporal, resp. rate 18, height 5' 6 (1.676 m), weight 246 lb (111.6 kg), SpO2 98%.    HEENT: Geographic tongue, mild white coat over the tongue Resp: Lungs clear bilaterally Cardio: Regular rate and rhythm GI: No hepatosplenomegaly, no apparent ascites Vascular: No leg edema   Portacath/PICC-without erythema  Lab Results:  Lab Results  Component Value Date   WBC 6.0 08/27/2024   HGB 9.6 (L) 08/27/2024   HCT 32.7 (L) 08/27/2024   MCV 74.1 (L) 08/27/2024   PLT 188 08/27/2024   NEUTROABS 4.5 08/27/2024    CMP  Lab Results  Component Value Date   NA 136 08/27/2024   K 3.7 08/27/2024   CL 101 08/27/2024   CO2 26 08/27/2024   GLUCOSE 236 (H) 08/27/2024   BUN 12 08/27/2024   CREATININE 0.76 08/27/2024   CALCIUM  9.1 08/27/2024   PROT 7.6 08/27/2024   ALBUMIN 4.0 08/27/2024   AST 33 08/27/2024   ALT 33 08/27/2024   ALKPHOS 195 (H) 08/27/2024   BILITOT 0.9 08/27/2024   GFRNONAA >60 08/27/2024   GFRAA >60 05/10/2020    Lab Results  Component Value Date   CEA1 1.08 03/07/2021   CEA 126.36 (H) 08/27/2024    Lab Results  Component Value Date   INR 1.0 02/08/2022   LABPROT 13.5 02/08/2022    Imaging:  NM GASTRIC EMPTYING Result Date: 08/26/2024 CLINICAL DATA:  Nausea bloating and early satiety. EXAM: NUCLEAR MEDICINE GASTRIC EMPTYING SCAN TECHNIQUE: After oral ingestion of radiolabeled meal, sequential abdominal images were obtained for 3 hours. Percentage of activity  emptying the stomach was calculated at 1 hour, 2 hour, 3 hour, and 4 hours. RADIOPHARMACEUTICALS:  2.17 mCi Tc-40m sulfur  colloid in standardized meal COMPARISON:  CT May 14, 2024 FINDINGS: Expected location of the stomach in the left upper quadrant. Ingested meal empties the stomach gradually over the course of the study. 41% emptied at 1 hr ( normal >= 10%) 73% emptied at 2 hr ( normal >= 40%) 100% emptied at 3 hr ( normal >= 70%) 100% emptied at 4 hr ( normal >= 90%) IMPRESSION: Normal gastric emptying study. Electronically Signed   By: Reyes Holder M.D.   On: 08/26/2024 16:21    Medications: I have reviewed the patient's current medications.   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 secondary to nausea (patient discontinued Xeloda  on day 11 due to colonoscopy) Colonoscopy 07/09/2020-nodular mucosa at the colonic anastomosis, biopsied; patent end-to-side ileocolonic anastomosis characterized by healthy-appearing mucosa and visible sutures; nonbleeding internal hemorrhoids, biopsy with benign ulcerated anastomotic mucosa with granulation tissue Cycle 4 adjuvant Xeloda  07/19/2020,  2000 mg every morning and 1500 mg every afternoon Cycle 5 adjuvant Xeloda  08/09/2020 Cycle 6 adjuvant Xeloda  08/31/2019 Cycle 7 adjuvant Xeloda  09/20/2020 Cycle 8 adjuvant Xeloda  10/11/2020 CTs 03/07/2021-no evidence of recurrent disease, hepatic steatosis, slight wall thickening at the junction of the descending and horizontal  duodenum Mild elevation of CEA 2023 12/01/2021-Guardant Reveal-ct DNA detected PET 12/30/2021-hypermetabolic right liver lesion, hypermetabolic area in a loop of mid small bowel with an SUV of 12.5, review of PET images at GI tumor conference 01/11/2022-small bowel uptake felt to be a benign finding MRI liver 01/10/2022-solitary 1.2 cm right liver lesion between segments 7 and 8 compatible with metastasis, subtle changes suggesting cirrhosis, hepatic steatosis CT enteroscopy 02/02/2022-small bowel loop with hypermetabolic activity on PET continues to have a bandlike density along the margin felt to represent a diverticulum or adjacent nodal tissue.  Small bowel tumor not excluded.  3-4 mm left omental nodule 02/08/2022-biopsy and ablation of solitary liver lesion, adenocarcinoma consistent with metastatic colorectal adenocarcinoma CTs 03/31/2022-unchanged circumstantial soft tissue thickening surrounding a loop of mid to distal small bowel with an adjacent nodular focus measuring 1.2 cm.,  Ablation cavity in segment 7, no other evidence of metastatic disease Diagnostic laparoscopy 05/26/2022-mid small bowel mass-resected, well to moderately differentiated adenocarcinoma, 0/2 lymph nodes, morphology consistent with a colon primary, tumor involved full-thickness including serosa with perineural and large vessel involvement; foundation 1-microsatellite stable, tumor mutation burden 2, K-ras Q61H CT abdomen/pelvis 07/10/2022-the right liver ablation defect is smaller, new subtle right liver lesion measuring 13 x 9 mm, enlarged lymph node in the right upper quadrant mesentery adjacent to surgical anastomosis PET 07/26/2022-new segment 6 liver lesion, enlarging hypermetabolic left omental lesion, there is another mildly hypermetabolic peritoneal implant, 7 mm omental nodule at the midline without hypermetabolism Cycle 1 FOLFOX 09/25/2022 Cycle 2 FOLFOX/bevacizumab  10/09/2022, 5-FU dose reduced secondary to mucositis Cycle  3 FOLFOX/bevacizumab  10/23/2022 Cycle 4 FOLFOX/bevacizumab  11/06/2022 Cycle 5 FOLFOX/bevacizumab  11/20/2022 Cycle 6 FOLFOX/bevacizumab  12/04/2022 CT abdomen/pelvis 12/14/2022-further contraction of the treated segment 7 lesion, slight enlargement of segment 6 lesion, no new liver lesion, increased size of ileocolic nodes and an omental lesion, stable small retroperitoneal nodes Cycle 7 FOLFOX/bevacizumab  12/18/2022 Cycle 8 FOLFOX/bevacizumab  01/02/2023, oxaliplatin  dose reduced secondary to prolonged nausea and neuropathy symptoms, Emend added Cycle 9 FOLFOX/bevacizumab  01/16/2023, oxaliplatin  discontinued secondary to neuropathy Cycle 1 FOLFIRI 02/19/2023, Avastin  held due to recent dental extractions Cycle 2 FOLFIRI/Avastin  03/05/2023 Cycle 3 FOLFIRI/Avastin  03/19/2023, 5-FU bolus held due to mucositis after cycle 2, Fulphila  added Cycle 4 FOLFIRI/Avastin  04/02/2023, Fulphila , Emend Cycle 5 FOLFIRI/Avastin  04/16/2023, Fulphila , Emend CTs 05/02/2023-similar ileocolic mesenteric adenopathy and omental metastasis, cirrhosis with portal hypertension, stable liver lesions, no new or progressive disease Cycle 6 FOLFIRI/Avastin  05/07/2023, Fulphila , Emend Cycle 7 FOLFIRI/Avastin  05/21/2023, Fulphila , Emend Cycle 8 FOLFIRI 06/04/2023, Fulphila , Emend; Avastin  held Cycle 9 FOLFIRI 06/18/2023, Fulphila , Emend, Avastin  held Treatment held 07/09/2023 patient request Cycle 10 FOLFIRI 07/18/2023, Fulphila , Emend, Avastin  held CTs 07/31/2023: Stable with unchanged hepatic lesions, mesenteric lymph nodes, and left omental nodule, unchanged morphologic changes of cirrhosis and portal hypertension Cycle 11 FOLFIRI/Avastin  08/01/2023, Fulphila  Chemotherapy held 08/15/2023 per patient request Cycle 12 FOLFIRI/Avastin  08/30/2023, Udenyca  Cycle 13 FOLFIRI/Avastin  09/19/2023, Udenyca  Cycle 14 FOLFIRI/Avastin  10/01/2023, Udenyca  Cycle 15 FOLFIRI/Avastin  10/22/2023, Udenyca  11/05/2023 treatment held per patient request due to dental  issue Cycle 16 FOLFIRI 11/19/2023, Udenyca , Avastin  held secondary to planned dental extractions CTs 11/28/2023: No evidence of progressive disease, stable hepatic, left upper quadrant omental, and ileocolic mesenteric lesions Cycle 17 FOLFIRI/Avastin  12/10/2023 Cycle 18 FOLFIRI  12/31/2023, Avastin  held secondary to 12/27/2023 tooth extraction Cycle 19 FOLFIRI/Avastin  01/22/2024 Cycle 20 FOLFIRI/Avastin  02/12/2024 03/08/2024 CTs: Stable small liver lesions, stable peritoneal nodules, stable left omental soft tissue mass, no evidence of disease progression, cirrhosis/portal hypertension Cycle 21 FOLFIRI/bevacizumab  03/17/2024 Treatment held 04/07/2024 per patient request Cycle 22 FOLFIRI/bevacizumab  04/14/2024  CTs 05/14/2024-no significant interval change of multiple ill-defined hypoattenuating liver lesions and peritoneal carcinomatosis.  No new metastatic disease in abdomen or pelvis. Cycle 23 FOLFIRI/bevacizumab  05/19/2024  Cycle 24 FOLFIRI/bevacizumab  06/19/2024 Cycle 25 FOLFIRI/bevacizumab  07/07/2024 Cycle 26 FOLFIRI/bevacizumab  08/07/2024 Cycle 27 FOLFIRI/bevacizumab  08/27/2024 Microcytic anemia secondary to #1-improved Colon polyps-sessile serrated adenoma of the descending colon, tubular adenomas with focal high-grade dysplasia of the transverse colon on colonoscopy 02/12/2020, tubular adenoma of the mid ascending colon on the surgical specimen 04/12/2020 Family history of colon cancer Depression Anxiety/panic attacks Hypertension Hyperlipidemia Nausea secondary to Xeloda ?-Resolved Gastroesophageal reflux disease-improved following discontinuation of Xeloda  Anemia, microcytic; ferritin 6 01/24/2022; stool positive for blood x3 01/25/2022; 02/02/2022 CT abdomen/pelvis enterography-bowel loop demonstrating hypermetabolic activity continues to have a bandlike density along its margin possibly representing a diverticulum or adjacent nodal tissue, difficult to exclude small bowel tumor, 3 x 4 mm left omental  nodule or lymph node, known right hepatic lobe tumor is occult on CT. Venofer  weekly x3 beginning 03/03/2022 Negative capsule endoscopy 03/09/2022 Venofer  03/03/2022 for 3 weekly doses Venofer  05/03/2022, 05/19/2022 Venofer  07/28/2022, 08/04/2022 Venofer  03/22/2023, 03/29/2023, 04/04/2023 Venofer  06/18/2023, 07/09/2023 07/03/2023 upper endoscopy and colonoscopy-severe portal hypertensive gastropathy in the gastric fundus, gastric body, stomach, red blood found greater curvature of the stomach and gastric antrum, treated with APC; colonoscopy showed dark old maroon-colored blood in the entire colon.  No evidence of active or recent bleeding in the colon. 12.  Anterior left neck nodule 03/17/2022-cyst?,  Lymph node? 13.  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 Brookens appears unchanged.  He will complete another cycle of FOLFIRI/bevacizumab  today.  The CEA is lower today.  He will return for an office visit and chemotherapy in 3 weeks.  He will be referred for a restaging CT evaluation after the next cycle of chemotherapy.  He will complete a course of Diflucan  for oral candidiasis.  Arley Hof, MD  08/27/2024  11:37 AM   "

## 2024-08-29 ENCOUNTER — Telehealth: Payer: Self-pay | Admitting: *Deleted

## 2024-08-29 ENCOUNTER — Inpatient Hospital Stay: Attending: Oncology

## 2024-08-29 VITALS — BP 127/67 | HR 75 | Temp 98.2°F | Resp 16

## 2024-08-29 DIAGNOSIS — C787 Secondary malignant neoplasm of liver and intrahepatic bile duct: Secondary | ICD-10-CM

## 2024-08-29 DIAGNOSIS — D509 Iron deficiency anemia, unspecified: Secondary | ICD-10-CM | POA: Insufficient documentation

## 2024-08-29 DIAGNOSIS — K219 Gastro-esophageal reflux disease without esophagitis: Secondary | ICD-10-CM | POA: Insufficient documentation

## 2024-08-29 DIAGNOSIS — Z79899 Other long term (current) drug therapy: Secondary | ICD-10-CM | POA: Diagnosis not present

## 2024-08-29 DIAGNOSIS — I10 Essential (primary) hypertension: Secondary | ICD-10-CM | POA: Diagnosis not present

## 2024-08-29 DIAGNOSIS — C182 Malignant neoplasm of ascending colon: Secondary | ICD-10-CM | POA: Insufficient documentation

## 2024-08-29 DIAGNOSIS — E785 Hyperlipidemia, unspecified: Secondary | ICD-10-CM | POA: Insufficient documentation

## 2024-08-29 DIAGNOSIS — Z5111 Encounter for antineoplastic chemotherapy: Secondary | ICD-10-CM | POA: Insufficient documentation

## 2024-08-29 MED ORDER — PEGFILGRASTIM-CBQV 6 MG/0.6ML ~~LOC~~ SOSY
6.0000 mg | PREFILLED_SYRINGE | Freq: Once | SUBCUTANEOUS | Status: AC
  Administered 2024-08-29: 6 mg via SUBCUTANEOUS
  Filled 2024-08-29: qty 0.6

## 2024-08-29 NOTE — Patient Instructions (Signed)

## 2024-08-29 NOTE — Telephone Encounter (Signed)
 Received fax from [employer] that patient had pump issues on 08/27/24. Called [facility] and he reports the air alarm was going off. After the air went through it stopped beeping. Was only out for ~ 20 minutes and looks to be doing well now.

## 2024-08-31 ENCOUNTER — Other Ambulatory Visit: Payer: Self-pay

## 2024-09-01 ENCOUNTER — Encounter: Payer: Self-pay | Admitting: Adult Health

## 2024-09-01 ENCOUNTER — Other Ambulatory Visit (HOSPITAL_BASED_OUTPATIENT_CLINIC_OR_DEPARTMENT_OTHER): Payer: Self-pay

## 2024-09-01 ENCOUNTER — Telehealth: Admitting: Adult Health

## 2024-09-01 ENCOUNTER — Encounter: Payer: Self-pay | Admitting: Oncology

## 2024-09-01 ENCOUNTER — Encounter: Payer: Self-pay | Admitting: Nurse Practitioner

## 2024-09-01 DIAGNOSIS — F331 Major depressive disorder, recurrent, moderate: Secondary | ICD-10-CM

## 2024-09-01 DIAGNOSIS — F411 Generalized anxiety disorder: Secondary | ICD-10-CM | POA: Diagnosis not present

## 2024-09-01 DIAGNOSIS — F410 Panic disorder [episodic paroxysmal anxiety] without agoraphobia: Secondary | ICD-10-CM

## 2024-09-01 DIAGNOSIS — F429 Obsessive-compulsive disorder, unspecified: Secondary | ICD-10-CM

## 2024-09-01 MED ORDER — ALPRAZOLAM 1 MG PO TABS: ORAL_TABLET | ORAL | 2 refills | Status: AC

## 2024-09-01 NOTE — Progress Notes (Signed)
 Anthony Calhoun [id number] 1966/05/01 59 y.o.  Virtual Visit via Video Note  I connected with pt @ on 09/01/2024 at  5:30 PM EST by a video enabled telemedicine application and verified that I am speaking with the correct person using two identifiers.   I discussed the limitations of evaluation and management by telemedicine and the availability of in person appointments. The patient expressed understanding and agreed to proceed.  I discussed the assessment and treatment plan with the patient. The patient was provided an opportunity to ask questions and all were answered. The patient agreed with the plan and demonstrated an understanding of the instructions.   The patient was advised to call back or seek an in-person evaluation if the symptoms worsen or if the condition fails to improve as anticipated.  I provided 25 minutes of non-face-to-face time during this encounter.  The patient was located at home.  The provider was located at [facility].   Angeline LOISE Sayers, NP   Subjective:   Patient ID:  Anthony Calhoun is a 59 y.o. (DOB 03-28-1966) male.  Chief Complaint: No chief complaint on file.   HPI Willow Reczek Wilmott presents for follow-up of GAD, MDD, OCD, panic attacks.  Describes mood today as not the best. Pleasant. Denies tearfulness. Mood symptoms - reports depression, anxiety and irritability - it depends on the day. Reports decreased interest and motivation. Reports decreased panic attacks. Reports worry, rumination and over thinking - cancer recurrence. Reports obsessive thoughts and acts. Reports situational stressors - mother living with him - also fighting cancer - 59 years old. Reports mood as lower. Stating I feel like I'm . Feels like medications are helpful. Taking medications as prescribed.  Energy levels lower - feels weak. Active, does not have a regular exercise routine.  Enjoys some usual interests and activities - music. Engaged. Lives  with fiance - has an adult son and daughter - 2 grandchildren. Spending time with family. Appetite improved. Weight stable - 245 to 250 pounds. Reports sleeping difficulties. Averages 3 hours at night and 6 hours during the day.   Focus and concentration stable. Completing tasks. Managing aspects of household. Out of work currently - totally disabled. Denies SI or HI.  Denies AH or VH. Denies self harm. Denies substance use.  Previous medication trials:  Celexa , Buspar   Review of Systems:  Review of Systems  Musculoskeletal:  Negative for gait problem.  Neurological:  Negative for tremors.  Psychiatric/Behavioral:         Please refer to HPI    Medications: I have reviewed the patient's current medications.  Current Outpatient Medications  Medication Sig Dispense Refill   Accu-Chek Softclix Lancets lancets SMARTSIG:Topical     acetaminophen  (TYLENOL ) 500 MG tablet Take 500 mg by mouth every 6 (six) hours as needed.     ALPRAZolam  (XANAX ) 1 MG tablet TAKE 1 TABLET BY MOUTH THREE TIMES A DAY AS NEEDED FOR ANXIETY 90 tablet 2   amLODipine  (NORVASC ) 5 MG tablet Take 5 mg by mouth daily.     carvedilol  (COREG ) 6.25 MG tablet Take 6.25 mg by mouth 2 (two) times daily with a meal.     fluconazole  (DIFLUCAN ) 100 MG tablet Take 1 tablet (100 mg total) by mouth daily. 5 tablet 0   HUMALOG KWIKPEN 100 UNIT/ML KwikPen Inject into the skin 3 (three) times daily. Sliding scale     LANTUS SOLOSTAR 100 UNIT/ML Solostar Pen Inject 45 Units into the skin daily. Taper up daily  magic mouthwash (nystatin , diphenhydrAMINE , alum & mag hydroxide) suspension mixture Swish and spit 5 mLs 4 (four) times daily as needed for mouth pain. 240 mL 1   pantoprazole  (PROTONIX ) 40 MG tablet TAKE 1 TABLET BY MOUTH EVERY DAY 30 tablet 2   promethazine  (PHENERGAN ) 25 MG tablet Take 1 tablet (25 mg total) by mouth every 6 (six) hours as needed. 60 tablet 1   sertraline  (ZOLOFT ) 100 MG tablet Take 1 tablet (100 mg  total) by mouth daily. 90 tablet 3   triamcinolone  (KENALOG ) 0.1 % paste Use as directed in the mouth or throat 2 (two) times daily as needed. 5 g 1   No current facility-administered medications for this visit.   Facility-Administered Medications Ordered in Other Visits  Medication Dose Route Frequency Provider Last Rate Last Admin   sodium chloride  flush (NS) 0.9 % injection 10 mL  10 mL Intravenous PRN Sherrill, Gary B, MD   10 mL at 08/15/23 1101    Medication Side Effects: None  Allergies: Allergies[1]  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 30s; smoking hx   Ovarian cancer Maternal Aunt        d. after 10   Colon cancer Maternal Uncle        mother's pat half brother; dx unknown age   Lung cancer Half-Brother        mat half brother    Social History   Socioeconomic History   Marital status: Divorced    Spouse name: Not on file   Number of children: Not on file   Years of education: Not on file   Highest education level: Not on file  Occupational History   Not on file  Tobacco Use   Smoking status: Never    Passive exposure: Past   Smokeless tobacco: Never  Vaping Use   Vaping status: Never Used  Substance and Sexual Activity   Alcohol use: No   Drug use: No   Sexual activity: Not Currently  Other Topics Concern   Not on file  Social History Narrative   Not on file   Social Drivers of Health   Tobacco Use: Low Risk (09/01/2024)    Patient History    Smoking Tobacco Use: Never    Smokeless Tobacco Use: Never    Passive Exposure: Past  Financial Resource Strain: Medium Risk (01/17/2022)   Overall Financial Resource Strain (CARDIA)    Difficulty of Paying Living Expenses: Somewhat hard  Food Insecurity: No Food Insecurity (05/26/2022)   Hunger Vital Sign    Worried About Running Out of Food in the Last Year: Never true    Ran Out of Food in the Last Year: Never true  Transportation Needs: No Transportation Needs (  05/26/2022)   PRAPARE - Administrator, Civil Service (Medical): No    Lack of Transportation (Non-Medical): No  Physical Activity: Inactive (01/17/2022)   Exercise Vital Sign    Days of Exercise per Week: 0 days    Minutes of Exercise per Session: 0 min  Stress: Stress Concern Present (01/17/2022)   [employer] of Occupational Health - Occupational Stress Questionnaire    Feeling of Stress : Rather much  Social Connections: Socially Isolated (01/17/2022)   Social Connection and Isolation Panel    Frequency of Communication with Friends and Family: Three times a week    Frequency of Social Gatherings with Friends and Family: Three times a week    Attends Religious Services: Never    Active Member of Clubs or Organizations: No    Attends Banker Meetings: Never    Marital Status: Divorced  Catering Manager Violence: Not At Risk (05/26/2022)   Humiliation, Afraid, Rape, and Kick questionnaire    Fear of Current or Ex-Partner: No    Emotionally Abused: No    Physically Abused: No    Sexually Abused: No  Depression (PHQ2-9): Medium Risk (08/27/2024)   Depression (PHQ2-9)    PHQ-2 Score: 6  Alcohol Screen: Low Risk (01/17/2022)   Alcohol Screen    Last Alcohol Screening Score (AUDIT): 1  Housing: Low Risk (05/26/2022)   Housing    Last Housing Risk Score: 0  Utilities: Not At Risk (05/26/2022)   [facility] Utilities    Threatened with loss of utilities: No  Health Literacy: Not on  file    Past Medical History, Surgical history, Social history, and Family history were reviewed and updated as appropriate.   Please see review of systems for further details on the patient's review from today.   Objective:   Physical Exam:  There were no vitals taken for this visit.  Physical Exam Constitutional:      General: He is not in acute distress. Musculoskeletal:        General: No deformity.  Neurological:     Mental Status: He is alert and oriented to person, place, and time.     Coordination: Coordination normal.  Psychiatric:        Attention and Perception: Attention and perception normal. He does not perceive auditory or visual hallucinations.        Mood and Affect: Mood normal. Mood is not anxious or depressed. Affect is not labile, blunt, angry or inappropriate.        Speech: Speech normal.        Behavior: Behavior normal.        Thought Content: Thought content normal. Thought content is not paranoid or delusional. Thought content does not include homicidal or suicidal ideation. Thought content does not include homicidal or suicidal plan.        Cognition and Memory: Cognition and memory normal.        Judgment: Judgment normal.     Comments: Insight intact     Lab Review:     Component Value Date/Time   NA 136 08/27/2024 1040   K 3.7 08/27/2024 1040   CL 101 08/27/2024 1040   CO2 26 08/27/2024 1040   GLUCOSE 236 (H) 08/27/2024 1040   BUN 12 08/27/2024 1040   CREATININE 0.76 08/27/2024 1040   CALCIUM  9.1 08/27/2024 1040   PROT 7.6 08/27/2024 1040   ALBUMIN 4.0 08/27/2024 1040   AST 33 08/27/2024 1040   ALT 33 08/27/2024 1040  ALKPHOS 195 (H) 08/27/2024 1040   BILITOT 0.9 08/27/2024 1040   GFRNONAA >60 08/27/2024 1040   GFRAA >60 05/10/2020 1543       Component Value Date/Time   WBC 6.0 08/27/2024 1040   WBC 8.4 05/27/2022 0527   RBC 4.41 08/27/2024 1040   HGB 9.6 (L) 08/27/2024 1040   HCT 32.7 (L) 08/27/2024 1040   PLT 188 08/27/2024  1040   MCV 74.1 (L) 08/27/2024 1040   MCH 21.8 (L) 08/27/2024 1040   MCHC 29.4 (L) 08/27/2024 1040   RDW 20.4 (H) 08/27/2024 1040   LYMPHSABS 0.7 08/27/2024 1040   MONOABS 0.5 08/27/2024 1040   EOSABS 0.1 08/27/2024 1040   BASOSABS 0.1 08/27/2024 1040    No results found for: POCLITH, LITHIUM   No results found for: PHENYTOIN, PHENOBARB, VALPROATE, CBMZ   .res Assessment: Plan:    Plan:  PDMP reviewed  Xanax  1mg  TID Zoloft  100mg  daily   RTC 6 months  25 minutes spent dedicated to the care of this patient on the date of this encounter to include pre-visit review of records, ordering of medication, post visit documentation, and face-to-face time with the patient discussing GAD, MDD, OCD and panic attacks. Discussed continuing current medication regimen.  Patient advised to contact office with any questions, adverse effects, or acute worsening in signs and symptoms.  Discussed potential benefits, risk, and side effects of benzodiazepines to include potential risk of tolerance and dependence, as well as possible drowsiness. Advised patient not to drive if experiencing drowsiness and to take lowest possible effective dose to minimize risk of dependence and tolerance.  Diagnoses and all orders for this visit:  Major depressive disorder, recurrent episode, moderate (HCC)  Generalized anxiety disorder -     ALPRAZolam  (XANAX ) 1 MG tablet; TAKE 1 TABLET BY MOUTH THREE TIMES A DAY AS NEEDED FOR ANXIETY  Panic attacks -     ALPRAZolam  (XANAX ) 1 MG tablet; TAKE 1 TABLET BY MOUTH THREE TIMES A DAY AS NEEDED FOR ANXIETY  Obsessive-compulsive disorder, unspecified type     Please see After Visit Summary for patient specific instructions.  Future Appointments  Date Time Provider Department Center  09/17/2024  9:45 AM [facility] None  09/17/2024 10:30 AM Cloretta Arley NOVAK, MD [facility] None  09/17/2024 11:00 AM [facility] None  09/19/2024   2:00 PM [facility] None  10/06/2024  9:15 AM [facility] None  10/06/2024  9:45 AM Debby Olam POUR, NP [facility] None  10/06/2024 10:45 AM [facility] None  10/08/2024  1:30 PM [facility] None    No orders of the defined types were placed in this encounter.     -------------------------------     [1]  Allergies Allergen Reactions   Chlorthalidone Swelling    Swelling of lips   Losartan Potassium-Hctz Swelling    Lip swelling   Cyclobenzaprine Other (See Comments)    Unknown reaction   Gabapentin  Nausea Only   Prednisone Anxiety    panic attack Panic attacks

## 2024-09-02 ENCOUNTER — Encounter: Payer: Self-pay | Admitting: Oncology

## 2024-09-02 ENCOUNTER — Encounter: Payer: Self-pay | Admitting: Nurse Practitioner

## 2024-09-02 ENCOUNTER — Other Ambulatory Visit (HOSPITAL_BASED_OUTPATIENT_CLINIC_OR_DEPARTMENT_OTHER): Payer: Self-pay

## 2024-09-02 ENCOUNTER — Other Ambulatory Visit: Payer: Self-pay | Admitting: *Deleted

## 2024-09-02 MED ORDER — NYSTATIN 100000 UNIT/ML MT SUSP: 5.0000 mL | Freq: Four times a day (QID) | OROMUCOSAL | 0 refills | Status: DC | PRN

## 2024-09-02 MED ORDER — NYSTATIN 100000 UNIT/ML MT SUSP
Freq: Two times a day (BID) | OROMUCOSAL | 0 refills | Status: AC
  Filled 2024-09-02: qty 240, 12d supply, fill #0

## 2024-09-02 NOTE — Progress Notes (Signed)
 Anthony Calhoun                                          MRN: [id number]   09/02/2024   The [facility] Quality Team Specialist reviewed this patient medical record for the purposes of chart review for care gap closure. The following were reviewed: chart review for care gap closure-glycemic status assessment.    [facility] Quality Team

## 2024-09-02 NOTE — Telephone Encounter (Signed)
 Anthony Calhoun requested refill on MMW due to mouth pain.

## 2024-09-04 ENCOUNTER — Other Ambulatory Visit (HOSPITAL_BASED_OUTPATIENT_CLINIC_OR_DEPARTMENT_OTHER): Payer: Self-pay

## 2024-09-08 ENCOUNTER — Other Ambulatory Visit (HOSPITAL_BASED_OUTPATIENT_CLINIC_OR_DEPARTMENT_OTHER): Payer: Self-pay

## 2024-09-10 ENCOUNTER — Encounter: Payer: Self-pay | Admitting: Oncology

## 2024-09-10 ENCOUNTER — Encounter: Payer: Self-pay | Admitting: Nurse Practitioner

## 2024-09-10 NOTE — Progress Notes (Signed)
 Anthony Calhoun                                          MRN: [id number]   09/10/2024   The [facility] Quality Team Specialist reviewed this patient medical record for the purposes of chart review for care gap closure. The following were reviewed: chart review for care gap closure-glycemic status assessment.    [facility] Quality Team

## 2024-09-14 ENCOUNTER — Other Ambulatory Visit: Payer: Self-pay | Admitting: Oncology

## 2024-09-15 ENCOUNTER — Encounter: Payer: Self-pay | Admitting: Oncology

## 2024-09-17 ENCOUNTER — Inpatient Hospital Stay

## 2024-09-17 ENCOUNTER — Inpatient Hospital Stay: Admitting: Oncology

## 2024-09-17 ENCOUNTER — Encounter: Payer: Self-pay | Admitting: *Deleted

## 2024-09-17 VITALS — BP 137/72 | HR 80 | Temp 98.1°F | Resp 18 | Ht 66.0 in | Wt 246.6 lb

## 2024-09-17 VITALS — BP 125/71 | HR 82 | Temp 98.7°F | Resp 18

## 2024-09-17 DIAGNOSIS — C189 Malignant neoplasm of colon, unspecified: Secondary | ICD-10-CM

## 2024-09-17 DIAGNOSIS — Z5111 Encounter for antineoplastic chemotherapy: Secondary | ICD-10-CM | POA: Diagnosis not present

## 2024-09-17 DIAGNOSIS — C787 Secondary malignant neoplasm of liver and intrahepatic bile duct: Secondary | ICD-10-CM

## 2024-09-17 DIAGNOSIS — D509 Iron deficiency anemia, unspecified: Secondary | ICD-10-CM

## 2024-09-17 LAB — CBC WITH DIFFERENTIAL (CANCER CENTER ONLY)
Abs Immature Granulocytes: 0.05 K/uL (ref 0.00–0.07)
Basophils Absolute: 0.1 K/uL (ref 0.0–0.1)
Basophils Relative: 1 %
Eosinophils Absolute: 0.1 K/uL (ref 0.0–0.5)
Eosinophils Relative: 2 %
HCT: 32 % — ABNORMAL LOW (ref 39.0–52.0)
Hemoglobin: 9.2 g/dL — ABNORMAL LOW (ref 13.0–17.0)
Immature Granulocytes: 1 %
Lymphocytes Relative: 16 %
Lymphs Abs: 0.9 K/uL (ref 0.7–4.0)
MCH: 21.3 pg — ABNORMAL LOW (ref 26.0–34.0)
MCHC: 28.8 g/dL — ABNORMAL LOW (ref 30.0–36.0)
MCV: 74.1 fL — ABNORMAL LOW (ref 80.0–100.0)
Monocytes Absolute: 0.4 K/uL (ref 0.1–1.0)
Monocytes Relative: 7 %
Neutro Abs: 4.4 K/uL (ref 1.7–7.7)
Neutrophils Relative %: 73 %
Platelet Count: 185 K/uL (ref 150–400)
RBC: 4.32 MIL/uL (ref 4.22–5.81)
RDW: 21.3 % — ABNORMAL HIGH (ref 11.5–15.5)
WBC Count: 6.1 K/uL (ref 4.0–10.5)
nRBC: 0 % (ref 0.0–0.2)

## 2024-09-17 LAB — TOTAL PROTEIN, URINE DIPSTICK: Protein, ur: 30 mg/dL — AB

## 2024-09-17 LAB — CMP (CANCER CENTER ONLY)
ALT: 26 U/L (ref 0–44)
AST: 27 U/L (ref 15–41)
Albumin: 4.2 g/dL (ref 3.5–5.0)
Alkaline Phosphatase: 178 U/L — ABNORMAL HIGH (ref 38–126)
Anion gap: 10 (ref 5–15)
BUN: 11 mg/dL (ref 6–20)
CO2: 26 mmol/L (ref 22–32)
Calcium: 9.2 mg/dL (ref 8.9–10.3)
Chloride: 99 mmol/L (ref 98–111)
Creatinine: 0.8 mg/dL (ref 0.61–1.24)
GFR, Estimated: >60 mL/min
Glucose, Bld: 206 mg/dL — ABNORMAL HIGH (ref 70–99)
Potassium: 3.9 mmol/L (ref 3.5–5.1)
Sodium: 135 mmol/L (ref 135–145)
Total Bilirubin: 1.2 mg/dL (ref 0.0–1.2)
Total Protein: 7.4 g/dL (ref 6.5–8.1)

## 2024-09-17 LAB — CEA (ACCESS): CEA (CHCC): 127.72 ng/mL — ABNORMAL HIGH (ref 0.00–5.00)

## 2024-09-17 LAB — FERRITIN: Ferritin: 22 ng/mL — ABNORMAL LOW (ref 24–336)

## 2024-09-17 MED ORDER — SODIUM CHLORIDE 0.9 % IV SOLN: INTRAVENOUS | Status: DC

## 2024-09-17 MED ORDER — ALTEPLASE 2 MG IJ SOLR
2.0000 mg | Freq: Once | INTRAMUSCULAR | Status: AC | PRN
  Administered 2024-09-17: 2 mg
  Filled 2024-09-17: qty 2

## 2024-09-17 MED ORDER — IRON SUCROSE 300 MG IVPB - SIMPLE MED
300.0000 mg | Freq: Once | Status: AC
  Administered 2024-09-17: 300 mg via INTRAVENOUS
  Filled 2024-09-17: qty 200

## 2024-09-17 NOTE — Patient Instructions (Signed)

## 2024-09-17 NOTE — Progress Notes (Signed)
 Dr. Cloretta said to schedule Venofer  on 1/27 or 1/29. Scheduling message sent.

## 2024-09-17 NOTE — Progress Notes (Signed)
 Arrived to Infusion appt from provider. Syringe with Alteplase  label still attached to port. Drew back 1cc of Alteplase . Port flushed easily with good blood return.

## 2024-09-17 NOTE — Patient Instructions (Signed)

## 2024-09-17 NOTE — Progress Notes (Signed)
 " [facility] OFFICE PROGRESS NOTE   Diagnosis: Colon cancer  INTERVAL HISTORY:   Mr. Ivey completed another cycle of FOLFIRI/bevacizumab  on 08/27/2024.  He reports malaise and nausea over the week following chemotherapy.  This improved until yesterday when he noted the onset of abdominal discomfort and bloating.  He has increased nausea.  No vomiting.  He had a bowel movement 1-2 days ago.  He does not feel well enough to be treated with chemotherapy today.  Objective:  Vital signs in last 24 hours:  Blood pressure 137/72, pulse 80, temperature 98.1 F (36.7 C), temperature source Temporal, resp. rate 18, height 5' 6 (1.676 m), weight 246 lb 9.6 oz (111.9 kg), SpO2 100%.    HEENT: No thrush or ulcers Resp: Lungs clear bilaterally Cardio: Regular rate and rhythm GI: Mildly distended, mild diffuse tenderness, no hepatosplenomegaly Vascular: No leg edema   Portacath/PICC-without erythema  Lab Results:  Lab Results  Component Value Date   WBC 6.1 09/17/2024   HGB 9.2 (L) 09/17/2024   HCT 32.0 (L) 09/17/2024   MCV 74.1 (L) 09/17/2024   PLT 185 09/17/2024   NEUTROABS 4.4 09/17/2024    CMP  Lab Results  Component Value Date   NA 135 09/17/2024   K 3.9 09/17/2024   CL 99 09/17/2024   CO2 26 09/17/2024   GLUCOSE 206 (H) 09/17/2024   BUN 11 09/17/2024   CREATININE 0.80 09/17/2024   CALCIUM  9.2 09/17/2024   PROT 7.4 09/17/2024   ALBUMIN 4.2 09/17/2024   AST 27 09/17/2024   ALT 26 09/17/2024   ALKPHOS 178 (H) 09/17/2024   BILITOT 1.2 09/17/2024   GFRNONAA >60 09/17/2024   GFRAA >60 05/10/2020    Lab Results  Component Value Date   CEA1 1.08 03/07/2021   CEA 127.72 (H) 09/17/2024    Lab Results  Component Value Date   INR 1.0 02/08/2022   LABPROT 13.5 02/08/2022    Imaging:  No results found.  Medications: I have reviewed the patient's current medications.   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 secondary to nausea (patient discontinued Xeloda  on day 11 due to colonoscopy) Colonoscopy 07/09/2020-nodular mucosa at the colonic anastomosis, biopsied; patent end-to-side ileocolonic anastomosis characterized by healthy-appearing mucosa and visible sutures; nonbleeding internal hemorrhoids, biopsy with benign ulcerated anastomotic mucosa with granulation tissue Cycle 4 adjuvant Xeloda  07/19/2020, 2000 mg every morning and 1500 mg every afternoon Cycle 5 adjuvant Xeloda  08/09/2020 Cycle 6 adjuvant Xeloda  08/31/2019 Cycle 7 adjuvant Xeloda  09/20/2020 Cycle 8 adjuvant Xeloda  10/11/2020 CTs 03/07/2021-no evidence of recurrent disease, hepatic steatosis, slight wall thickening at the junction of the descending and horizontal duodenum Mild elevation of CEA 2023 12/01/2021-Guardant Reveal-ct DNA detected PET 12/30/2021-hypermetabolic right liver lesion, hypermetabolic area in a loop of mid small bowel with an SUV of 12.5, review of PET images at GI tumor conference 01/11/2022-small bowel uptake felt to be a benign finding MRI liver 01/10/2022-solitary 1.2 cm right liver lesion between segments 7 and 8 compatible with metastasis, subtle changes suggesting cirrhosis, hepatic steatosis CT enteroscopy 02/02/2022-small bowel loop with hypermetabolic activity on  PET continues to have a bandlike density along the margin felt to represent a diverticulum or adjacent nodal tissue.  Small bowel tumor not excluded.  3-4 mm left omental nodule 02/08/2022-biopsy and  ablation of solitary liver lesion, adenocarcinoma consistent with metastatic colorectal adenocarcinoma CTs 03/31/2022-unchanged circumstantial soft tissue thickening surrounding a loop of mid to distal small bowel with an adjacent nodular focus measuring 1.2 cm.,  Ablation cavity in segment 7, no other evidence of metastatic disease Diagnostic laparoscopy 05/26/2022-mid small bowel mass-resected, well to moderately differentiated adenocarcinoma, 0/2 lymph nodes, morphology consistent with a colon primary, tumor involved full-thickness including serosa with perineural and large vessel involvement; foundation 1-microsatellite stable, tumor mutation burden 2, K-ras Q61H CT abdomen/pelvis 07/10/2022-the right liver ablation defect is smaller, new subtle right liver lesion measuring 13 x 9 mm, enlarged lymph node in the right upper quadrant mesentery adjacent to surgical anastomosis PET 07/26/2022-new segment 6 liver lesion, enlarging hypermetabolic left omental lesion, there is another mildly hypermetabolic peritoneal implant, 7 mm omental nodule at the midline without hypermetabolism Cycle 1 FOLFOX 09/25/2022 Cycle 2 FOLFOX/bevacizumab  10/09/2022, 5-FU dose reduced secondary to mucositis Cycle 3 FOLFOX/bevacizumab  10/23/2022 Cycle 4 FOLFOX/bevacizumab  11/06/2022 Cycle 5 FOLFOX/bevacizumab  11/20/2022 Cycle 6 FOLFOX/bevacizumab  12/04/2022 CT abdomen/pelvis 12/14/2022-further contraction of the treated segment 7 lesion, slight enlargement of segment 6 lesion, no new liver lesion, increased size of ileocolic nodes and an omental lesion, stable small retroperitoneal nodes Cycle 7 FOLFOX/bevacizumab  12/18/2022 Cycle 8 FOLFOX/bevacizumab  01/02/2023, oxaliplatin  dose reduced secondary to prolonged nausea and neuropathy symptoms, Emend added Cycle 9 FOLFOX/bevacizumab  01/16/2023, oxaliplatin  discontinued secondary to neuropathy Cycle 1 FOLFIRI 02/19/2023, Avastin  held due to recent dental extractions Cycle 2 FOLFIRI/Avastin   03/05/2023 Cycle 3 FOLFIRI/Avastin  03/19/2023, 5-FU bolus held due to mucositis after cycle 2, Fulphila  added Cycle 4 FOLFIRI/Avastin  04/02/2023, Fulphila , Emend Cycle 5 FOLFIRI/Avastin  04/16/2023, Fulphila , Emend CTs 05/02/2023-similar ileocolic mesenteric adenopathy and omental metastasis, cirrhosis with portal hypertension, stable liver lesions, no new or progressive disease Cycle 6 FOLFIRI/Avastin  05/07/2023, Fulphila , Emend Cycle 7 FOLFIRI/Avastin  05/21/2023, Fulphila , Emend Cycle 8 FOLFIRI 06/04/2023, Fulphila , Emend; Avastin  held Cycle 9 FOLFIRI 06/18/2023, Fulphila , Emend, Avastin  held Treatment held 07/09/2023 patient request Cycle 10 FOLFIRI 07/18/2023, Fulphila , Emend, Avastin  held CTs 07/31/2023: Stable with unchanged hepatic lesions, mesenteric lymph nodes, and left omental nodule, unchanged morphologic changes of cirrhosis and portal hypertension Cycle 11 FOLFIRI/Avastin  08/01/2023, Fulphila  Chemotherapy held 08/15/2023 per patient request Cycle 12 FOLFIRI/Avastin  08/30/2023, Udenyca  Cycle 13 FOLFIRI/Avastin  09/19/2023, Udenyca  Cycle 14 FOLFIRI/Avastin  10/01/2023, Udenyca  Cycle 15 FOLFIRI/Avastin  10/22/2023, Udenyca  11/05/2023 treatment held per patient request due to dental issue Cycle 16 FOLFIRI 11/19/2023, Udenyca , Avastin  held secondary to planned dental extractions CTs 11/28/2023: No evidence of progressive disease, stable hepatic, left upper quadrant omental, and ileocolic mesenteric lesions Cycle 17 FOLFIRI/Avastin  12/10/2023 Cycle 18 FOLFIRI 12/31/2023, Avastin  held secondary to 12/27/2023 tooth extraction Cycle 19 FOLFIRI/Avastin  01/22/2024 Cycle 20 FOLFIRI/Avastin  02/12/2024 03/08/2024 CTs: Stable small liver lesions, stable peritoneal nodules, stable left omental soft tissue mass, no evidence of disease progression, cirrhosis/portal hypertension Cycle 21 FOLFIRI/bevacizumab  03/17/2024 Treatment held 04/07/2024 per patient request Cycle 22 FOLFIRI/bevacizumab  04/14/2024  CTs 05/14/2024-no  significant interval change of multiple ill-defined hypoattenuating liver lesions and peritoneal carcinomatosis.  No new metastatic disease in abdomen or pelvis. Cycle 23 FOLFIRI/bevacizumab  05/19/2024  Cycle 24 FOLFIRI/bevacizumab  06/19/2024 Cycle 25 FOLFIRI/bevacizumab  07/07/2024 Cycle 26 FOLFIRI/bevacizumab  08/07/2024 Cycle 27 FOLFIRI/bevacizumab  08/27/2024 Microcytic anemia secondary to #1-improved Colon polyps-sessile serrated adenoma of the descending colon, tubular adenomas with focal high-grade dysplasia of the transverse colon on colonoscopy 02/12/2020, tubular adenoma  of the mid ascending colon on the surgical specimen 04/12/2020 Family history of colon cancer Depression Anxiety/panic attacks Hypertension Hyperlipidemia Nausea secondary to Xeloda ?-Resolved Gastroesophageal reflux disease-improved following discontinuation of Xeloda  Anemia, microcytic; ferritin 6 01/24/2022; stool positive for blood x3 01/25/2022; 02/02/2022 CT abdomen/pelvis enterography-bowel loop demonstrating hypermetabolic activity continues to have a bandlike density along its margin possibly representing a diverticulum or adjacent nodal tissue, difficult to exclude small bowel tumor, 3 x 4 mm left omental nodule or lymph node, known right hepatic lobe tumor is occult on CT. Venofer  weekly x3 beginning 03/03/2022 Negative capsule endoscopy 03/09/2022 Venofer  03/03/2022 for 3 weekly doses Venofer  05/03/2022, 05/19/2022 Venofer  07/28/2022, 08/04/2022 Venofer  03/22/2023, 03/29/2023, 04/04/2023 Venofer  06/18/2023, 07/09/2023, multiple doses in 2025 07/03/2023 upper endoscopy and colonoscopy-severe portal hypertensive gastropathy in the gastric fundus, gastric body, stomach, red blood found greater curvature of the stomach and gastric antrum, treated with APC; colonoscopy showed dark old maroon-colored blood in the entire colon.  No evidence of active or recent bleeding in the colon. 12.  Anterior left neck nodule 03/17/2022-cyst?,  Lymph  node? 13.  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  Oxaliplatin  neuropathy-mild loss of vibratory sense 12/04/2022, 12/18/2022, 01/02/2023      Disposition: Anthony Calhoun has metastatic colon cancer.  He continues treatment with FOLFIRI/bevacizumab .  The CEA is stable.  He complains of abdominal discomfort and nausea today.  He could be developing early obstructive symptoms.  He does not feel well enough to complete treatment with chemotherapy today.  Treatment will be held today and rescheduled for 1 week.  He is anemic and the ferritin is low.  He will receive Venofer  today and again next week. Mr. Burich will contact us  for increased nausea or abdominal pain.  He will undergo restaging CTs after the next cycle of chemotherapy.  Arley Hof, MD  09/17/2024  11:24 AM   "

## 2024-09-17 NOTE — Progress Notes (Signed)
 No blood return from port despite multiple flush and repositioning attempts. Alteplase  administered per protocol. Syringe left in place and labeled with drug name, date, time and my initials. Phlebotomy appt scheduled and front desk/phlebotomist advised.

## 2024-09-17 NOTE — Progress Notes (Signed)
 Patient seen by Dr. Arley Hof today  Vitals are within treatment parameters:Yes   Labs are within treatment parameters: Yes   Treatment plan has been signed: Yes   Per physician team, Patient is ready for treatment. Please note the following modifications: Does not want chemotherapy this week. Will have IV Venofer  today (orders are in).

## 2024-09-19 ENCOUNTER — Inpatient Hospital Stay

## 2024-09-20 ENCOUNTER — Other Ambulatory Visit: Payer: Self-pay

## 2024-09-22 ENCOUNTER — Encounter: Payer: Self-pay | Admitting: Oncology

## 2024-09-22 ENCOUNTER — Telehealth: Payer: Self-pay

## 2024-09-22 NOTE — Telephone Encounter (Signed)
 Spoke directly with patient advising him of his appointment start time on Tuesday 09/23/24 being rescheduled to start at 1000. Patient was agreeable and verbalized understanding. He promised to call the clinic if he is unable to come in and reschedule.

## 2024-09-23 ENCOUNTER — Telehealth: Payer: Self-pay

## 2024-09-23 ENCOUNTER — Encounter: Payer: Self-pay | Admitting: Oncology

## 2024-09-23 ENCOUNTER — Inpatient Hospital Stay: Admitting: Nurse Practitioner

## 2024-09-23 ENCOUNTER — Telehealth: Payer: Self-pay | Admitting: Oncology

## 2024-09-23 ENCOUNTER — Inpatient Hospital Stay

## 2024-09-23 NOTE — Telephone Encounter (Signed)
 Spoke with patient to make aware opening appointment tomorrow at 08:45 am Flush, 09:15 am follow-up with Olam NP, 10 am infusion. Patient stated is still having Abdominal discomfort/ bloating, stated coming from the middle of naval to right quadrant, has has 2 bowel movement in the last two hours, denies any type of bleeding, vomiting, pain 6, Nausea is chronic, voiced having a scan done before having treatment. Made aware since symptoms are not new/ have been present, can discuss concerns with about scan with provider tomorrow, understood. Voiced will be at appointment on 09/24/24. No further questions. Scheduling message was sent.

## 2024-09-23 NOTE — Telephone Encounter (Signed)
 Spoke with Patient, he sent a message to Dr.Sherrill this morning stating that he is in a lot of pain and discomfort. He stated in the message that he thinks he should have a scan before having treatment again? If someone could please call him back.

## 2024-09-23 NOTE — Telephone Encounter (Signed)
 Patient contacted via mobile in regards to coming into scheduled appointment today at 10 am Flush, 10:15 am F/U with Olam NP, 11 am Infusion. Patient stated that he is unable to get out of driveway due to ice. Made aware scheduler would reach out to reschedule, understood. Provider made aware.

## 2024-09-24 ENCOUNTER — Telehealth: Payer: Self-pay | Admitting: Oncology

## 2024-09-24 ENCOUNTER — Inpatient Hospital Stay: Admitting: Nurse Practitioner

## 2024-09-24 ENCOUNTER — Inpatient Hospital Stay

## 2024-09-24 ENCOUNTER — Telehealth: Payer: Self-pay | Admitting: Nurse Practitioner

## 2024-09-24 NOTE — Telephone Encounter (Signed)
 Called PT to update on rescheduled appt time and date; confirmed with PT.

## 2024-09-25 ENCOUNTER — Inpatient Hospital Stay

## 2024-09-25 DIAGNOSIS — C189 Malignant neoplasm of colon, unspecified: Secondary | ICD-10-CM

## 2024-09-26 ENCOUNTER — Inpatient Hospital Stay

## 2024-09-28 ENCOUNTER — Other Ambulatory Visit: Payer: Self-pay

## 2024-09-29 ENCOUNTER — Other Ambulatory Visit (HOSPITAL_BASED_OUTPATIENT_CLINIC_OR_DEPARTMENT_OTHER): Payer: Self-pay

## 2024-10-01 ENCOUNTER — Inpatient Hospital Stay: Attending: Oncology

## 2024-10-01 ENCOUNTER — Telehealth: Payer: Self-pay | Admitting: *Deleted

## 2024-10-01 ENCOUNTER — Telehealth: Payer: Self-pay | Admitting: Oncology

## 2024-10-01 ENCOUNTER — Inpatient Hospital Stay: Admitting: Oncology

## 2024-10-01 ENCOUNTER — Inpatient Hospital Stay

## 2024-10-01 DIAGNOSIS — C189 Malignant neoplasm of colon, unspecified: Secondary | ICD-10-CM

## 2024-10-01 NOTE — Telephone Encounter (Addendum)
 Mr. Schlageter is reporting his stomach and upper abdomen pain is progressive. It starts midline and radiates to right side. Tends to get worse after he eats. Uncomfortable to lie down at times. BM and passing gas does not alleviate the pain much. No nausea presently (had not taken promethazine  in 2 weeks). Bowels are moving daily and able to pass gas. Asking for a CT scan (prefers am) and asking when MD plans on his next Venofer  infusion (last 1/21) and chemotherapy treatment? Per Dr. Cloretta: schedule CT scan. Venofer  around time of next chemotherapy. Schedule lab/flush/OV/chemo after CT scan is completed. Jeryl is aware of plan. Awaiting PA from health plan.

## 2024-10-01 NOTE — Telephone Encounter (Signed)
 Pt called and is still having bad stomach pain. He would like to cancel appt with Dr. Cloretta and reschedule hopefully after a scan appt.

## 2024-10-03 ENCOUNTER — Telehealth: Payer: Self-pay | Admitting: *Deleted

## 2024-10-03 ENCOUNTER — Inpatient Hospital Stay

## 2024-10-03 NOTE — Telephone Encounter (Signed)
 Florence Lowers with port access and CT scan appointment for 10/06/24. He agrees to appointment time.

## 2024-10-06 ENCOUNTER — Inpatient Hospital Stay

## 2024-10-06 ENCOUNTER — Inpatient Hospital Stay: Admitting: Nurse Practitioner

## 2024-10-06 ENCOUNTER — Inpatient Hospital Stay: Attending: Oncology

## 2024-10-06 ENCOUNTER — Ambulatory Visit (HOSPITAL_BASED_OUTPATIENT_CLINIC_OR_DEPARTMENT_OTHER)

## 2024-10-08 ENCOUNTER — Inpatient Hospital Stay

## 2024-10-13 ENCOUNTER — Inpatient Hospital Stay: Payer: Self-pay

## 2024-10-13 ENCOUNTER — Inpatient Hospital Stay: Payer: Self-pay | Admitting: Nurse Practitioner

## 2024-10-15 ENCOUNTER — Inpatient Hospital Stay: Payer: Self-pay

## 2024-10-20 ENCOUNTER — Inpatient Hospital Stay: Payer: Self-pay

## 2024-10-20 ENCOUNTER — Inpatient Hospital Stay: Payer: Self-pay | Admitting: Nurse Practitioner

## 2024-10-22 ENCOUNTER — Inpatient Hospital Stay: Payer: Self-pay

## 2024-11-10 ENCOUNTER — Inpatient Hospital Stay: Admitting: Oncology

## 2024-11-10 ENCOUNTER — Inpatient Hospital Stay

## 2024-11-12 ENCOUNTER — Inpatient Hospital Stay
